# Patient Record
Sex: Female | Born: 1975 | Race: Black or African American | Hispanic: No | Marital: Single | Smoking: Never smoker
Health system: Southern US, Community
[De-identification: ages and names within clinical notes are randomized; demographics above are authoritative.]

## PROBLEM LIST (undated history)

## (undated) DIAGNOSIS — F200 Paranoid schizophrenia: Secondary | ICD-10-CM

## (undated) DIAGNOSIS — R443 Hallucinations, unspecified: Secondary | ICD-10-CM

## (undated) DIAGNOSIS — F32A Depression, unspecified: Secondary | ICD-10-CM

## (undated) DIAGNOSIS — F329 Major depressive disorder, single episode, unspecified: Secondary | ICD-10-CM

## (undated) DIAGNOSIS — F259 Schizoaffective disorder, unspecified: Secondary | ICD-10-CM

## (undated) DIAGNOSIS — M1990 Unspecified osteoarthritis, unspecified site: Secondary | ICD-10-CM

---

## 2016-11-10 DIAGNOSIS — S40011A Contusion of right shoulder, initial encounter: Secondary | ICD-10-CM | POA: Diagnosis not present

## 2016-11-10 DIAGNOSIS — M25551 Pain in right hip: Secondary | ICD-10-CM | POA: Diagnosis not present

## 2016-11-10 DIAGNOSIS — Y92810 Car as the place of occurrence of the external cause: Secondary | ICD-10-CM | POA: Diagnosis not present

## 2016-11-10 DIAGNOSIS — M79601 Pain in right arm: Secondary | ICD-10-CM | POA: Diagnosis not present

## 2016-11-10 DIAGNOSIS — Z743 Need for continuous supervision: Secondary | ICD-10-CM | POA: Diagnosis not present

## 2016-11-10 DIAGNOSIS — E039 Hypothyroidism, unspecified: Secondary | ICD-10-CM | POA: Diagnosis not present

## 2016-11-10 DIAGNOSIS — M25511 Pain in right shoulder: Secondary | ICD-10-CM | POA: Diagnosis not present

## 2016-11-12 DIAGNOSIS — Z743 Need for continuous supervision: Secondary | ICD-10-CM | POA: Diagnosis not present

## 2016-11-12 DIAGNOSIS — F339 Major depressive disorder, recurrent, unspecified: Secondary | ICD-10-CM | POA: Diagnosis not present

## 2016-11-12 DIAGNOSIS — E039 Hypothyroidism, unspecified: Secondary | ICD-10-CM | POA: Diagnosis not present

## 2016-11-12 DIAGNOSIS — F22 Delusional disorders: Secondary | ICD-10-CM | POA: Diagnosis not present

## 2016-11-12 DIAGNOSIS — F341 Dysthymic disorder: Secondary | ICD-10-CM | POA: Diagnosis not present

## 2016-11-12 DIAGNOSIS — R443 Hallucinations, unspecified: Secondary | ICD-10-CM | POA: Diagnosis not present

## 2016-11-12 DIAGNOSIS — R51 Headache: Secondary | ICD-10-CM | POA: Diagnosis not present

## 2016-11-12 DIAGNOSIS — R4182 Altered mental status, unspecified: Secondary | ICD-10-CM | POA: Diagnosis not present

## 2016-11-13 DIAGNOSIS — R440 Auditory hallucinations: Secondary | ICD-10-CM | POA: Diagnosis not present

## 2016-11-13 DIAGNOSIS — F209 Schizophrenia, unspecified: Secondary | ICD-10-CM | POA: Diagnosis not present

## 2016-11-13 DIAGNOSIS — Z743 Need for continuous supervision: Secondary | ICD-10-CM | POA: Diagnosis not present

## 2016-11-13 DIAGNOSIS — F29 Unspecified psychosis not due to a substance or known physiological condition: Secondary | ICD-10-CM | POA: Diagnosis not present

## 2017-03-04 DIAGNOSIS — R42 Dizziness and giddiness: Secondary | ICD-10-CM | POA: Diagnosis not present

## 2017-03-04 DIAGNOSIS — Z7689 Persons encountering health services in other specified circumstances: Secondary | ICD-10-CM | POA: Diagnosis not present

## 2017-03-06 DIAGNOSIS — R45851 Suicidal ideations: Secondary | ICD-10-CM | POA: Diagnosis not present

## 2017-03-06 DIAGNOSIS — F329 Major depressive disorder, single episode, unspecified: Secondary | ICD-10-CM | POA: Diagnosis not present

## 2017-03-06 DIAGNOSIS — R410 Disorientation, unspecified: Secondary | ICD-10-CM | POA: Diagnosis not present

## 2017-03-06 DIAGNOSIS — F209 Schizophrenia, unspecified: Secondary | ICD-10-CM | POA: Diagnosis not present

## 2017-03-06 DIAGNOSIS — M79604 Pain in right leg: Secondary | ICD-10-CM | POA: Diagnosis not present

## 2017-03-06 DIAGNOSIS — D649 Anemia, unspecified: Secondary | ICD-10-CM | POA: Diagnosis not present

## 2017-03-06 DIAGNOSIS — R531 Weakness: Secondary | ICD-10-CM | POA: Diagnosis not present

## 2017-03-10 DIAGNOSIS — M549 Dorsalgia, unspecified: Secondary | ICD-10-CM | POA: Diagnosis not present

## 2017-03-10 DIAGNOSIS — F259 Schizoaffective disorder, unspecified: Secondary | ICD-10-CM | POA: Diagnosis not present

## 2017-03-22 DIAGNOSIS — Z743 Need for continuous supervision: Secondary | ICD-10-CM | POA: Diagnosis not present

## 2017-03-22 DIAGNOSIS — R6889 Other general symptoms and signs: Secondary | ICD-10-CM | POA: Diagnosis not present

## 2017-03-22 DIAGNOSIS — Z590 Homelessness: Secondary | ICD-10-CM | POA: Diagnosis not present

## 2017-03-22 DIAGNOSIS — F29 Unspecified psychosis not due to a substance or known physiological condition: Secondary | ICD-10-CM | POA: Diagnosis not present

## 2017-03-22 DIAGNOSIS — F209 Schizophrenia, unspecified: Secondary | ICD-10-CM | POA: Diagnosis not present

## 2017-03-22 DIAGNOSIS — F250 Schizoaffective disorder, bipolar type: Secondary | ICD-10-CM | POA: Diagnosis not present

## 2017-03-23 DIAGNOSIS — R6889 Other general symptoms and signs: Secondary | ICD-10-CM | POA: Diagnosis not present

## 2017-03-23 DIAGNOSIS — F250 Schizoaffective disorder, bipolar type: Secondary | ICD-10-CM | POA: Diagnosis not present

## 2017-03-24 DIAGNOSIS — D72819 Decreased white blood cell count, unspecified: Secondary | ICD-10-CM | POA: Diagnosis present

## 2017-03-24 DIAGNOSIS — F329 Major depressive disorder, single episode, unspecified: Secondary | ICD-10-CM | POA: Diagnosis not present

## 2017-03-24 DIAGNOSIS — R110 Nausea: Secondary | ICD-10-CM | POA: Diagnosis not present

## 2017-03-24 DIAGNOSIS — Z743 Need for continuous supervision: Secondary | ICD-10-CM | POA: Diagnosis not present

## 2017-03-24 DIAGNOSIS — R112 Nausea with vomiting, unspecified: Secondary | ICD-10-CM | POA: Diagnosis not present

## 2017-03-24 DIAGNOSIS — M79604 Pain in right leg: Secondary | ICD-10-CM | POA: Diagnosis not present

## 2017-03-24 DIAGNOSIS — F209 Schizophrenia, unspecified: Secondary | ICD-10-CM | POA: Diagnosis not present

## 2017-03-24 DIAGNOSIS — Z590 Homelessness: Secondary | ICD-10-CM | POA: Diagnosis not present

## 2017-03-24 DIAGNOSIS — F259 Schizoaffective disorder, unspecified: Secondary | ICD-10-CM | POA: Diagnosis present

## 2017-03-24 DIAGNOSIS — N926 Irregular menstruation, unspecified: Secondary | ICD-10-CM | POA: Diagnosis not present

## 2017-03-24 DIAGNOSIS — F29 Unspecified psychosis not due to a substance or known physiological condition: Secondary | ICD-10-CM | POA: Diagnosis not present

## 2017-03-24 DIAGNOSIS — F600 Paranoid personality disorder: Secondary | ICD-10-CM | POA: Diagnosis not present

## 2017-03-24 DIAGNOSIS — R1110 Vomiting, unspecified: Secondary | ICD-10-CM | POA: Diagnosis not present

## 2017-03-24 DIAGNOSIS — D649 Anemia, unspecified: Secondary | ICD-10-CM | POA: Diagnosis present

## 2017-04-06 DIAGNOSIS — F258 Other schizoaffective disorders: Secondary | ICD-10-CM | POA: Diagnosis not present

## 2017-04-07 DIAGNOSIS — R51 Headache: Secondary | ICD-10-CM | POA: Diagnosis not present

## 2017-04-07 DIAGNOSIS — R112 Nausea with vomiting, unspecified: Secondary | ICD-10-CM | POA: Diagnosis not present

## 2017-04-07 DIAGNOSIS — F2089 Other schizophrenia: Secondary | ICD-10-CM | POA: Diagnosis not present

## 2017-04-07 DIAGNOSIS — Z048 Encounter for examination and observation for other specified reasons: Secondary | ICD-10-CM | POA: Diagnosis not present

## 2017-04-07 DIAGNOSIS — F329 Major depressive disorder, single episode, unspecified: Secondary | ICD-10-CM | POA: Diagnosis not present

## 2017-04-09 DIAGNOSIS — Z590 Homelessness: Secondary | ICD-10-CM | POA: Diagnosis not present

## 2017-04-09 DIAGNOSIS — F99 Mental disorder, not otherwise specified: Secondary | ICD-10-CM | POA: Diagnosis not present

## 2017-04-09 DIAGNOSIS — Z9119 Patient's noncompliance with other medical treatment and regimen: Secondary | ICD-10-CM | POA: Diagnosis not present

## 2017-04-09 DIAGNOSIS — F259 Schizoaffective disorder, unspecified: Secondary | ICD-10-CM | POA: Diagnosis not present

## 2017-04-09 DIAGNOSIS — F22 Delusional disorders: Secondary | ICD-10-CM | POA: Diagnosis not present

## 2017-04-09 DIAGNOSIS — R440 Auditory hallucinations: Secondary | ICD-10-CM | POA: Diagnosis not present

## 2017-04-09 DIAGNOSIS — R442 Other hallucinations: Secondary | ICD-10-CM | POA: Diagnosis not present

## 2017-04-09 DIAGNOSIS — F250 Schizoaffective disorder, bipolar type: Secondary | ICD-10-CM | POA: Diagnosis not present

## 2017-04-09 DIAGNOSIS — Z743 Need for continuous supervision: Secondary | ICD-10-CM | POA: Diagnosis not present

## 2017-04-09 DIAGNOSIS — R443 Hallucinations, unspecified: Secondary | ICD-10-CM | POA: Diagnosis not present

## 2017-04-10 DIAGNOSIS — F250 Schizoaffective disorder, bipolar type: Secondary | ICD-10-CM | POA: Diagnosis not present

## 2017-04-16 DIAGNOSIS — F258 Other schizoaffective disorders: Secondary | ICD-10-CM | POA: Diagnosis not present

## 2017-04-19 DIAGNOSIS — M25551 Pain in right hip: Secondary | ICD-10-CM | POA: Diagnosis not present

## 2017-04-19 DIAGNOSIS — F258 Other schizoaffective disorders: Secondary | ICD-10-CM | POA: Diagnosis not present

## 2017-04-19 DIAGNOSIS — M791 Myalgia: Secondary | ICD-10-CM | POA: Diagnosis not present

## 2017-04-21 DIAGNOSIS — F319 Bipolar disorder, unspecified: Secondary | ICD-10-CM | POA: Diagnosis not present

## 2017-04-21 DIAGNOSIS — F250 Schizoaffective disorder, bipolar type: Secondary | ICD-10-CM | POA: Diagnosis not present

## 2017-04-21 DIAGNOSIS — R5381 Other malaise: Secondary | ICD-10-CM | POA: Diagnosis not present

## 2017-04-23 DIAGNOSIS — F259 Schizoaffective disorder, unspecified: Secondary | ICD-10-CM | POA: Diagnosis not present

## 2017-04-23 DIAGNOSIS — F29 Unspecified psychosis not due to a substance or known physiological condition: Secondary | ICD-10-CM | POA: Diagnosis not present

## 2017-04-24 DIAGNOSIS — F259 Schizoaffective disorder, unspecified: Secondary | ICD-10-CM | POA: Diagnosis not present

## 2017-04-24 DIAGNOSIS — F29 Unspecified psychosis not due to a substance or known physiological condition: Secondary | ICD-10-CM | POA: Diagnosis not present

## 2017-04-25 DIAGNOSIS — F29 Unspecified psychosis not due to a substance or known physiological condition: Secondary | ICD-10-CM | POA: Diagnosis not present

## 2017-04-25 DIAGNOSIS — F259 Schizoaffective disorder, unspecified: Secondary | ICD-10-CM | POA: Diagnosis not present

## 2017-04-26 DIAGNOSIS — R109 Unspecified abdominal pain: Secondary | ICD-10-CM | POA: Diagnosis not present

## 2017-04-26 DIAGNOSIS — D72819 Decreased white blood cell count, unspecified: Secondary | ICD-10-CM | POA: Diagnosis not present

## 2017-04-26 DIAGNOSIS — M25551 Pain in right hip: Secondary | ICD-10-CM | POA: Diagnosis not present

## 2017-04-26 DIAGNOSIS — F209 Schizophrenia, unspecified: Secondary | ICD-10-CM | POA: Diagnosis present

## 2017-04-26 DIAGNOSIS — F259 Schizoaffective disorder, unspecified: Secondary | ICD-10-CM | POA: Diagnosis not present

## 2017-04-26 DIAGNOSIS — F250 Schizoaffective disorder, bipolar type: Secondary | ICD-10-CM | POA: Diagnosis not present

## 2017-04-26 DIAGNOSIS — I499 Cardiac arrhythmia, unspecified: Secondary | ICD-10-CM | POA: Diagnosis not present

## 2017-04-26 DIAGNOSIS — R1013 Epigastric pain: Secondary | ICD-10-CM | POA: Diagnosis not present

## 2017-04-26 DIAGNOSIS — M79671 Pain in right foot: Secondary | ICD-10-CM | POA: Diagnosis not present

## 2017-04-26 DIAGNOSIS — R1110 Vomiting, unspecified: Secondary | ICD-10-CM | POA: Diagnosis not present

## 2017-04-26 DIAGNOSIS — Z781 Physical restraint status: Secondary | ICD-10-CM | POA: Diagnosis not present

## 2017-04-26 DIAGNOSIS — M79672 Pain in left foot: Secondary | ICD-10-CM | POA: Diagnosis not present

## 2017-04-26 DIAGNOSIS — K30 Functional dyspepsia: Secondary | ICD-10-CM | POA: Diagnosis not present

## 2017-04-26 DIAGNOSIS — S79911A Unspecified injury of right hip, initial encounter: Secondary | ICD-10-CM | POA: Diagnosis not present

## 2017-05-08 DIAGNOSIS — Z743 Need for continuous supervision: Secondary | ICD-10-CM | POA: Diagnosis not present

## 2017-05-08 DIAGNOSIS — F259 Schizoaffective disorder, unspecified: Secondary | ICD-10-CM | POA: Diagnosis not present

## 2017-05-08 DIAGNOSIS — F209 Schizophrenia, unspecified: Secondary | ICD-10-CM | POA: Diagnosis not present

## 2017-05-08 DIAGNOSIS — F250 Schizoaffective disorder, bipolar type: Secondary | ICD-10-CM | POA: Diagnosis present

## 2017-05-08 DIAGNOSIS — F251 Schizoaffective disorder, depressive type: Secondary | ICD-10-CM | POA: Diagnosis not present

## 2017-05-10 DIAGNOSIS — F251 Schizoaffective disorder, depressive type: Secondary | ICD-10-CM | POA: Diagnosis not present

## 2017-05-12 DIAGNOSIS — F251 Schizoaffective disorder, depressive type: Secondary | ICD-10-CM | POA: Diagnosis not present

## 2017-05-13 DIAGNOSIS — F251 Schizoaffective disorder, depressive type: Secondary | ICD-10-CM | POA: Diagnosis not present

## 2017-05-14 DIAGNOSIS — F251 Schizoaffective disorder, depressive type: Secondary | ICD-10-CM | POA: Diagnosis not present

## 2017-05-15 DIAGNOSIS — F251 Schizoaffective disorder, depressive type: Secondary | ICD-10-CM | POA: Diagnosis not present

## 2017-05-20 DIAGNOSIS — F251 Schizoaffective disorder, depressive type: Secondary | ICD-10-CM | POA: Diagnosis not present

## 2017-05-21 DIAGNOSIS — F251 Schizoaffective disorder, depressive type: Secondary | ICD-10-CM | POA: Diagnosis not present

## 2017-05-28 DIAGNOSIS — F251 Schizoaffective disorder, depressive type: Secondary | ICD-10-CM | POA: Diagnosis not present

## 2017-05-29 DIAGNOSIS — F251 Schizoaffective disorder, depressive type: Secondary | ICD-10-CM | POA: Diagnosis not present

## 2017-06-03 DIAGNOSIS — F251 Schizoaffective disorder, depressive type: Secondary | ICD-10-CM | POA: Diagnosis not present

## 2017-06-06 DIAGNOSIS — F251 Schizoaffective disorder, depressive type: Secondary | ICD-10-CM | POA: Diagnosis not present

## 2017-06-10 DIAGNOSIS — F251 Schizoaffective disorder, depressive type: Secondary | ICD-10-CM | POA: Diagnosis not present

## 2017-06-14 DIAGNOSIS — F251 Schizoaffective disorder, depressive type: Secondary | ICD-10-CM | POA: Diagnosis not present

## 2017-06-18 DIAGNOSIS — F251 Schizoaffective disorder, depressive type: Secondary | ICD-10-CM | POA: Diagnosis not present

## 2017-06-28 DIAGNOSIS — F251 Schizoaffective disorder, depressive type: Secondary | ICD-10-CM | POA: Diagnosis not present

## 2017-07-02 DIAGNOSIS — F251 Schizoaffective disorder, depressive type: Secondary | ICD-10-CM | POA: Diagnosis not present

## 2017-07-07 DIAGNOSIS — F251 Schizoaffective disorder, depressive type: Secondary | ICD-10-CM | POA: Diagnosis not present

## 2017-07-17 DIAGNOSIS — F251 Schizoaffective disorder, depressive type: Secondary | ICD-10-CM | POA: Diagnosis not present

## 2017-07-30 DIAGNOSIS — F251 Schizoaffective disorder, depressive type: Secondary | ICD-10-CM | POA: Diagnosis not present

## 2017-08-31 DIAGNOSIS — F449 Dissociative and conversion disorder, unspecified: Secondary | ICD-10-CM | POA: Diagnosis not present

## 2017-08-31 DIAGNOSIS — F251 Schizoaffective disorder, depressive type: Secondary | ICD-10-CM | POA: Diagnosis not present

## 2017-09-03 DIAGNOSIS — H4311 Vitreous hemorrhage, right eye: Secondary | ICD-10-CM | POA: Diagnosis not present

## 2017-09-03 DIAGNOSIS — H11133 Conjunctival pigmentations, bilateral: Secondary | ICD-10-CM | POA: Diagnosis not present

## 2017-09-05 DIAGNOSIS — F251 Schizoaffective disorder, depressive type: Secondary | ICD-10-CM | POA: Diagnosis not present

## 2017-09-24 DIAGNOSIS — N946 Dysmenorrhea, unspecified: Secondary | ICD-10-CM | POA: Diagnosis not present

## 2017-09-24 DIAGNOSIS — F251 Schizoaffective disorder, depressive type: Secondary | ICD-10-CM | POA: Diagnosis not present

## 2017-10-02 DIAGNOSIS — N946 Dysmenorrhea, unspecified: Secondary | ICD-10-CM | POA: Diagnosis not present

## 2017-10-02 DIAGNOSIS — F251 Schizoaffective disorder, depressive type: Secondary | ICD-10-CM | POA: Diagnosis not present

## 2017-10-17 DIAGNOSIS — F251 Schizoaffective disorder, depressive type: Secondary | ICD-10-CM | POA: Diagnosis not present

## 2017-10-17 DIAGNOSIS — N946 Dysmenorrhea, unspecified: Secondary | ICD-10-CM | POA: Diagnosis not present

## 2017-11-05 DIAGNOSIS — N946 Dysmenorrhea, unspecified: Secondary | ICD-10-CM | POA: Diagnosis not present

## 2017-11-05 DIAGNOSIS — F251 Schizoaffective disorder, depressive type: Secondary | ICD-10-CM | POA: Diagnosis not present

## 2017-11-06 DIAGNOSIS — F251 Schizoaffective disorder, depressive type: Secondary | ICD-10-CM | POA: Diagnosis not present

## 2017-11-13 DIAGNOSIS — F251 Schizoaffective disorder, depressive type: Secondary | ICD-10-CM | POA: Diagnosis not present

## 2017-11-13 DIAGNOSIS — N946 Dysmenorrhea, unspecified: Secondary | ICD-10-CM | POA: Diagnosis not present

## 2017-11-24 DIAGNOSIS — F449 Dissociative and conversion disorder, unspecified: Secondary | ICD-10-CM | POA: Diagnosis not present

## 2017-11-24 DIAGNOSIS — F251 Schizoaffective disorder, depressive type: Secondary | ICD-10-CM | POA: Diagnosis not present

## 2017-12-07 DIAGNOSIS — F251 Schizoaffective disorder, depressive type: Secondary | ICD-10-CM | POA: Diagnosis not present

## 2017-12-18 DIAGNOSIS — F251 Schizoaffective disorder, depressive type: Secondary | ICD-10-CM | POA: Diagnosis not present

## 2017-12-18 DIAGNOSIS — F449 Dissociative and conversion disorder, unspecified: Secondary | ICD-10-CM | POA: Diagnosis not present

## 2017-12-31 DIAGNOSIS — F251 Schizoaffective disorder, depressive type: Secondary | ICD-10-CM | POA: Diagnosis not present

## 2017-12-31 DIAGNOSIS — F449 Dissociative and conversion disorder, unspecified: Secondary | ICD-10-CM | POA: Diagnosis not present

## 2018-01-03 DIAGNOSIS — F449 Dissociative and conversion disorder, unspecified: Secondary | ICD-10-CM | POA: Diagnosis not present

## 2018-01-03 DIAGNOSIS — F251 Schizoaffective disorder, depressive type: Secondary | ICD-10-CM | POA: Diagnosis not present

## 2018-02-11 DIAGNOSIS — Z743 Need for continuous supervision: Secondary | ICD-10-CM | POA: Diagnosis not present

## 2018-02-11 DIAGNOSIS — R109 Unspecified abdominal pain: Secondary | ICD-10-CM | POA: Diagnosis not present

## 2018-02-11 DIAGNOSIS — R1084 Generalized abdominal pain: Secondary | ICD-10-CM | POA: Diagnosis not present

## 2018-02-12 DIAGNOSIS — Z008 Encounter for other general examination: Secondary | ICD-10-CM | POA: Diagnosis not present

## 2018-02-12 DIAGNOSIS — F209 Schizophrenia, unspecified: Secondary | ICD-10-CM | POA: Diagnosis not present

## 2018-02-13 DIAGNOSIS — Z008 Encounter for other general examination: Secondary | ICD-10-CM | POA: Diagnosis not present

## 2018-02-13 DIAGNOSIS — R443 Hallucinations, unspecified: Secondary | ICD-10-CM | POA: Diagnosis not present

## 2018-02-13 DIAGNOSIS — Z781 Physical restraint status: Secondary | ICD-10-CM | POA: Diagnosis not present

## 2018-02-13 DIAGNOSIS — F250 Schizoaffective disorder, bipolar type: Secondary | ICD-10-CM | POA: Diagnosis present

## 2018-02-13 DIAGNOSIS — F209 Schizophrenia, unspecified: Secondary | ICD-10-CM | POA: Diagnosis not present

## 2018-02-13 DIAGNOSIS — Z743 Need for continuous supervision: Secondary | ICD-10-CM | POA: Diagnosis not present

## 2018-02-13 DIAGNOSIS — Z9114 Patient's other noncompliance with medication regimen: Secondary | ICD-10-CM | POA: Diagnosis not present

## 2018-03-13 DIAGNOSIS — Z0489 Encounter for examination and observation for other specified reasons: Secondary | ICD-10-CM | POA: Diagnosis not present

## 2018-03-13 DIAGNOSIS — R4182 Altered mental status, unspecified: Secondary | ICD-10-CM | POA: Diagnosis not present

## 2018-03-13 DIAGNOSIS — F258 Other schizoaffective disorders: Secondary | ICD-10-CM | POA: Diagnosis not present

## 2018-03-13 DIAGNOSIS — F200 Paranoid schizophrenia: Secondary | ICD-10-CM | POA: Diagnosis not present

## 2018-03-13 DIAGNOSIS — F259 Schizoaffective disorder, unspecified: Secondary | ICD-10-CM | POA: Diagnosis not present

## 2018-03-13 DIAGNOSIS — Z743 Need for continuous supervision: Secondary | ICD-10-CM | POA: Diagnosis not present

## 2018-03-13 DIAGNOSIS — F3189 Other bipolar disorder: Secondary | ICD-10-CM | POA: Diagnosis not present

## 2018-05-16 DIAGNOSIS — Z4800 Encounter for change or removal of nonsurgical wound dressing: Secondary | ICD-10-CM | POA: Diagnosis not present

## 2018-05-16 DIAGNOSIS — R10814 Left lower quadrant abdominal tenderness: Secondary | ICD-10-CM | POA: Diagnosis not present

## 2018-05-16 DIAGNOSIS — Z7401 Bed confinement status: Secondary | ICD-10-CM | POA: Diagnosis not present

## 2018-05-16 DIAGNOSIS — Z008 Encounter for other general examination: Secondary | ICD-10-CM | POA: Diagnosis not present

## 2018-05-16 DIAGNOSIS — R404 Transient alteration of awareness: Secondary | ICD-10-CM | POA: Diagnosis not present

## 2018-05-16 DIAGNOSIS — F3189 Other bipolar disorder: Secondary | ICD-10-CM | POA: Diagnosis not present

## 2018-05-16 DIAGNOSIS — Z743 Need for continuous supervision: Secondary | ICD-10-CM | POA: Diagnosis not present

## 2018-05-16 DIAGNOSIS — E871 Hypo-osmolality and hyponatremia: Secondary | ICD-10-CM | POA: Diagnosis not present

## 2018-05-16 DIAGNOSIS — F251 Schizoaffective disorder, depressive type: Secondary | ICD-10-CM | POA: Diagnosis not present

## 2018-05-16 DIAGNOSIS — K5900 Constipation, unspecified: Secondary | ICD-10-CM | POA: Diagnosis not present

## 2018-05-16 DIAGNOSIS — F6389 Other impulse disorders: Secondary | ICD-10-CM | POA: Diagnosis not present

## 2018-05-16 DIAGNOSIS — F258 Other schizoaffective disorders: Secondary | ICD-10-CM | POA: Diagnosis not present

## 2018-05-28 DIAGNOSIS — K5900 Constipation, unspecified: Secondary | ICD-10-CM | POA: Diagnosis not present

## 2018-05-30 DIAGNOSIS — R10814 Left lower quadrant abdominal tenderness: Secondary | ICD-10-CM | POA: Diagnosis not present

## 2018-05-31 DIAGNOSIS — Z008 Encounter for other general examination: Secondary | ICD-10-CM | POA: Diagnosis not present

## 2018-05-31 DIAGNOSIS — E871 Hypo-osmolality and hyponatremia: Secondary | ICD-10-CM | POA: Diagnosis not present

## 2018-05-31 DIAGNOSIS — Z4800 Encounter for change or removal of nonsurgical wound dressing: Secondary | ICD-10-CM | POA: Diagnosis not present

## 2018-06-23 ENCOUNTER — Other Ambulatory Visit: Payer: Self-pay

## 2018-06-23 ENCOUNTER — Emergency Department (HOSPITAL_COMMUNITY)
Admission: EM | Admit: 2018-06-23 | Discharge: 2018-06-24 | Disposition: A | Discharge status: Other Acute Inpatient Hospital | Payer: Medicare Other | Attending: Emergency Medicine | Admitting: Emergency Medicine

## 2018-06-23 DIAGNOSIS — F29 Unspecified psychosis not due to a substance or known physiological condition: Secondary | ICD-10-CM | POA: Insufficient documentation

## 2018-06-23 DIAGNOSIS — F312 Bipolar disorder, current episode manic severe with psychotic features: Secondary | ICD-10-CM | POA: Diagnosis present

## 2018-06-23 DIAGNOSIS — F319 Bipolar disorder, unspecified: Secondary | ICD-10-CM | POA: Insufficient documentation

## 2018-06-23 LAB — COMPREHENSIVE METABOLIC PANEL
ALK PHOS: 50 U/L (ref 38–126)
ALT: 18 U/L (ref 0–44)
AST: 28 U/L (ref 15–41)
Albumin: 4.2 g/dL (ref 3.5–5.0)
Anion gap: 9 (ref 5–15)
BUN: 7 mg/dL (ref 6–20)
CALCIUM: 9.1 mg/dL (ref 8.9–10.3)
CO2: 25 mmol/L (ref 22–32)
CREATININE: 0.63 mg/dL (ref 0.44–1.00)
Chloride: 104 mmol/L (ref 98–111)
Glucose, Bld: 84 mg/dL (ref 70–99)
Potassium: 3.6 mmol/L (ref 3.5–5.1)
Sodium: 138 mmol/L (ref 135–145)
TOTAL PROTEIN: 7 g/dL (ref 6.5–8.1)
Total Bilirubin: 0.8 mg/dL (ref 0.3–1.2)

## 2018-06-23 LAB — CBC WITH DIFFERENTIAL/PLATELET
Basophils Absolute: 0 K/uL (ref 0.0–0.1)
Basophils Relative: 0 %
Eosinophils Absolute: 0.1 K/uL (ref 0.0–0.7)
Eosinophils Relative: 2 %
HCT: 35.3 % — ABNORMAL LOW (ref 36.0–46.0)
Hemoglobin: 11.7 g/dL — ABNORMAL LOW (ref 12.0–15.0)
Lymphocytes Relative: 36 %
Lymphs Abs: 1.9 K/uL (ref 0.7–4.0)
MCH: 28 pg (ref 26.0–34.0)
MCHC: 33.1 g/dL (ref 30.0–36.0)
MCV: 84.4 fL (ref 78.0–100.0)
Monocytes Absolute: 0.6 K/uL (ref 0.1–1.0)
Monocytes Relative: 11 %
Neutro Abs: 2.7 K/uL (ref 1.7–7.7)
Neutrophils Relative %: 51 %
Platelets: 415 K/uL — ABNORMAL HIGH (ref 150–400)
RBC: 4.18 MIL/uL (ref 3.87–5.11)
RDW: 14.4 % (ref 11.5–15.5)
WBC: 5.3 K/uL (ref 4.0–10.5)

## 2018-06-23 LAB — ETHANOL: Alcohol, Ethyl (B): <10 mg/dL

## 2018-06-23 LAB — I-STAT BETA HCG BLOOD, ED (MC, WL, AP ONLY): I-stat hCG, quantitative: <5 m[IU]/mL

## 2018-06-23 LAB — CBG MONITORING, ED: Glucose-Capillary: 77 mg/dL (ref 70–99)

## 2018-06-23 MED ORDER — ZIPRASIDONE MESYLATE 20 MG IM SOLR: 20.0000 mg | INTRAMUSCULAR | Status: DC | PRN

## 2018-06-23 MED ORDER — ALUM & MAG HYDROXIDE-SIMETH 200-200-20 MG/5ML PO SUSP: 30.0000 mL | Freq: Four times a day (QID) | ORAL | Status: DC | PRN

## 2018-06-23 MED ORDER — ACETAMINOPHEN 325 MG PO TABS
650.0000 mg | ORAL_TABLET | ORAL | Status: DC | PRN
  Administered 2018-06-24 (×2): 650 mg via ORAL
  Filled 2018-06-23 (×2): qty 2

## 2018-06-23 MED ORDER — RISPERIDONE 1 MG PO TBDP
2.0000 mg | ORAL_TABLET | Freq: Three times a day (TID) | ORAL | Status: DC | PRN
  Filled 2018-06-23: qty 2

## 2018-06-23 MED ORDER — ZOLPIDEM TARTRATE 5 MG PO TABS: 5.0000 mg | ORAL_TABLET | Freq: Every evening | ORAL | Status: DC | PRN

## 2018-06-23 MED ORDER — LORAZEPAM 1 MG PO TABS
1.0000 mg | ORAL_TABLET | ORAL | Status: AC | PRN
  Administered 2018-06-24: 1 mg via ORAL
  Filled 2018-06-23: qty 1

## 2018-06-23 MED ORDER — ONDANSETRON HCL 4 MG PO TABS: 4.0000 mg | ORAL_TABLET | Freq: Three times a day (TID) | ORAL | Status: DC | PRN
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
## 2018-06-23 NOTE — ED Triage Notes (Addendum)
Pt to ED via [facility] with [facility] with complaints of manic like behavior. Pt was found wondering in parking lot. Pt was stating "Jesus Lorie ApleyChrist is out [place]" talking very fast to [facility] and then got combative with herself. Pt was also stating she "was on mission to kill [employer] CompanyJesus Christ" [facility] and did state to [facility] "do not take me to that hospital" while pointing towards [facility]. Pt was aggressive to [facility] when asking what medications she takes, [facility] states she got very defensive and angry did a complete flip. Pt was given 5 mg of Versed and 5 mg of Haldol. Pt states she is here from [place]

## 2018-06-23 NOTE — ED Notes (Signed)
Two personal bags in locker 28

## 2018-06-23 NOTE — ED Provider Notes (Addendum)
[facility]-EMERGENCY DEPT Provider Note   CSN: [id number] Arrival date & time: 06/23/18  1931     History   Chief Complaint Chief Complaint  Patient presents with  . Manic Behavior    HPI [name] [name] is a 42 y.o. female.  HPI Level 125 caveat 42 year old female brought in via EMS with reports that she was agitated and "speaking and voices".  They state that she call them.  She was found outside.  She answered some questions and became very agitated yelling that Jesus was trying to kill her and striking herself.  She received Versed and Haldol IV prior to arrival to hospital. No past medical history on file.  There are no active problems to display for this patient.   PMH- not known   OB History   None      Home Medications    Prior to Admission medications   Not on File    Family History No family history on file.  Social History Social History   Tobacco Use  . Smoking status: Not on file  Substance Use Topics  . Alcohol use: Not on file  . Drug use: Not on file     Allergies   Patient has no allergy information on record.   Review of Systems Review of Systems  All other systems reviewed and are negative.    Physical Exam Updated Vital Signs Ht 1.473 m (4\' 10" )   Wt 53.1 kg   SpO2 98%   BMI 24.45 kg/m   Physical Exam  Constitutional: She is oriented to person, place, and time. She appears well-developed and well-nourished.  HENT:  Head: Normocephalic and atraumatic.  Right Ear: External ear normal.  Left Ear: External ear normal.  Nose: Nose normal.  Mouth/Throat: Oropharynx is clear and moist.  Eyes: Pupils are equal, round, and reactive to light. EOM are normal.  Neck: Neck supple.  Cardiovascular: Normal rate, regular rhythm, normal heart sounds and intact distal pulses.  Pulmonary/Chest: Effort normal and breath sounds normal.  Abdominal: Soft.  Musculoskeletal: Normal range of motion.  Neurological: She is  alert and oriented to person, place, and time. She displays normal reflexes. No cranial nerve deficit or sensory deficit. She exhibits normal muscle tone. Coordination normal.  Skin: Skin is warm and dry. Capillary refill takes less than 2 seconds.  Psychiatric: She has a normal mood and affect.  Nursing note and vitals reviewed.    ED Treatments / Results  Labs (all labs ordered are listed, but only abnormal results are displayed) Labs Reviewed  COMPREHENSIVE METABOLIC PANEL  ETHANOL  RAPID URINE DRUG SCREEN, HOSP PERFORMED  CBC WITH DIFFERENTIAL/PLATELET  I-STAT BETA HCG BLOOD, ED ([facility], [facility], [facility] ONLY)  CBG MONITORING, ED    EKG EKG Interpretation  Date/Time:  Sunday June 23 2018 20:13:08 EDT Ventricular Rate:  77 PR Interval:    QRS Duration: 92 QT Interval:  380 QTC Calculation: 430 R Axis:   85 Text Interpretation:  Sinus rhythm diffuse nsst Confirmed by Margarita Grizzleay, Lawanna Cecere [phone]([id number]) on 06/23/2018 8:21:27 PM   Radiology No results found.  Procedures Procedures (including critical care time)  Medications Ordered in ED Medications - No data to display   Initial Impression / Assessment and Plan / ED Course  I have reviewed the triage vital signs and the nursing notes.  Pertinent labs & imaging results that were available during my care of the patient were reviewed by me and considered in my medical decision making (see  chart for details).    42 year old female who presented today with psychosis.  She was treated prehospital with Versed and Haldol.  Here she is calm and cooperative.  Medical evaluation shows no evidence of acute metabolic derangement.  She will be placed in psych hold pending TTS evaluation Patient seen by tts and they are seeking inpatient placement Final Clinical Impressions(s) / ED Diagnoses   Final diagnoses:  Psychosis, unspecified psychosis type [facility](HCC)    ED Discharge Orders    None       Margarita Grizzleay, Aliene Tamura, MD 06/23/18 2203    Margarita Grizzleay, Taylia Berber,  MD 06/23/18 2242

## 2018-06-23 NOTE — ED Notes (Signed)
Extra blanket given to pt. Pt unable to urinate at this time.

## 2018-06-23 NOTE — Progress Notes (Signed)
TTS consulted with [name] SievertSpencer Simon, PA who recommends inpt treatment. EDP [name] [name], Danielle, MD and pt's nurse have been advised of the disposition. TTS to seek placement.   [name] [name], MSW, LCSW Therapeutic Triage Specialist  [phone]

## 2018-06-23 NOTE — ED Notes (Signed)
Bed: [id number] Expected date:  Expected time:  Means of arrival:  Comments: EMS 42 yo female wandering in parking lot-combative 110/60-Haldol and Versed 5 mg IV

## 2018-06-24 ENCOUNTER — Inpatient Hospital Stay (HOSPITAL_COMMUNITY)
Admission: AD | Admit: 2018-06-24 | Discharge: 2018-06-28 | DRG: 885 | Disposition: A | Discharge status: Home / Self Care | Payer: Medicare Other | Attending: Psychiatry | Admitting: Psychiatry

## 2018-06-24 ENCOUNTER — Encounter (HOSPITAL_COMMUNITY): Payer: Self-pay | Admitting: *Deleted

## 2018-06-24 ENCOUNTER — Other Ambulatory Visit: Payer: Self-pay

## 2018-06-24 DIAGNOSIS — F419 Anxiety disorder, unspecified: Secondary | ICD-10-CM | POA: Diagnosis present

## 2018-06-24 DIAGNOSIS — R451 Restlessness and agitation: Secondary | ICD-10-CM | POA: Diagnosis present

## 2018-06-24 DIAGNOSIS — G4700 Insomnia, unspecified: Secondary | ICD-10-CM | POA: Diagnosis present

## 2018-06-24 DIAGNOSIS — Z79899 Other long term (current) drug therapy: Secondary | ICD-10-CM | POA: Diagnosis not present

## 2018-06-24 DIAGNOSIS — K219 Gastro-esophageal reflux disease without esophagitis: Secondary | ICD-10-CM | POA: Diagnosis present

## 2018-06-24 DIAGNOSIS — F312 Bipolar disorder, current episode manic severe with psychotic features: Secondary | ICD-10-CM | POA: Diagnosis present

## 2018-06-24 DIAGNOSIS — Z915 Personal history of self-harm: Secondary | ICD-10-CM

## 2018-06-24 LAB — RAPID URINE DRUG SCREEN, HOSP PERFORMED
AMPHETAMINES: NOT DETECTED
BARBITURATES: NOT DETECTED
BENZODIAZEPINES: POSITIVE — AB
COCAINE: NOT DETECTED
TETRAHYDROCANNABINOL: NOT DETECTED

## 2018-06-24 MED ORDER — OLANZAPINE 10 MG PO TABS: 10.0000 mg | ORAL_TABLET | Freq: Two times a day (BID) | ORAL | Status: DC

## 2018-06-24 MED ORDER — ALUM & MAG HYDROXIDE-SIMETH 200-200-20 MG/5ML PO SUSP: 30.0000 mL | Freq: Four times a day (QID) | ORAL | Status: DC | PRN

## 2018-06-24 MED ORDER — ZIPRASIDONE MESYLATE 20 MG IM SOLR: 20.0000 mg | INTRAMUSCULAR | Status: DC | PRN

## 2018-06-24 MED ORDER — ZOLPIDEM TARTRATE 5 MG PO TABS
5.0000 mg | ORAL_TABLET | Freq: Every evening | ORAL | Status: DC | PRN
  Administered 2018-06-24: 5 mg via ORAL
  Filled 2018-06-24: qty 1

## 2018-06-24 MED ORDER — ACETAMINOPHEN 325 MG PO TABS: 650.0000 mg | ORAL_TABLET | ORAL | Status: DC | PRN

## 2018-06-24 MED ORDER — RISPERIDONE 2 MG PO TBDP: 2.0000 mg | ORAL_TABLET | Freq: Three times a day (TID) | ORAL | Status: DC | PRN

## 2018-06-24 MED ORDER — ONDANSETRON HCL 4 MG PO TABS: 4.0000 mg | ORAL_TABLET | Freq: Three times a day (TID) | ORAL | Status: DC | PRN

## 2018-06-24 MED ORDER — MAGNESIUM HYDROXIDE 400 MG/5ML PO SUSP: 30.0000 mL | Freq: Every day | ORAL | Status: DC | PRN

## 2018-06-24 MED ORDER — ALUM & MAG HYDROXIDE-SIMETH 200-200-20 MG/5ML PO SUSP
30.0000 mL | ORAL | Status: DC | PRN
  Administered 2018-06-27: 30 mL via ORAL
  Filled 2018-06-24: qty 30

## 2018-06-24 MED ORDER — ACETAMINOPHEN 325 MG PO TABS
650.0000 mg | ORAL_TABLET | Freq: Four times a day (QID) | ORAL | Status: DC | PRN
  Administered 2018-06-24 – 2018-06-27 (×5): 650 mg via ORAL
  Filled 2018-06-24 (×5): qty 2

## 2018-06-24 MED ORDER — OLANZAPINE 10 MG PO TABS
10.0000 mg | ORAL_TABLET | Freq: Two times a day (BID) | ORAL | Status: DC
Start: 2018-06-24 — End: 2018-06-27
  Administered 2018-06-25 – 2018-06-27 (×5): 10 mg via ORAL
  Filled 2018-06-24 (×9): qty 1

## 2018-06-24 NOTE — BH Assessment (Signed)
[facility]  Per Juanetta BeetsJacqueline Norman, DO, this pt requires psychiatric hospitalization at this time.  Malva LimesLinsey Strader, RN, [id number]; [facility] will be ready to receive pt at 17:00.  Pt has signed Voluntary Admission and Consent for Treatment, as well as Consent to Release Information to no one, and signed forms [facility].  Pt's nurse, Adela LankJacqueline, has been notified, and agrees to send original paperwork along with pt via [facility] [place], and to call report to [phone].  Doylene Canninghomas Melvia Matousek, [place] Behavioral Health Coordinator [phone]

## 2018-06-24 NOTE — Tx Team (Signed)
Initial Treatment Plan 06/24/2018 8:00 PM [name] DittyBillie Dillard [id number]:[id number]    PATIENT STRESSORS: Medication change or noncompliance Other: housing issues   PATIENT STRENGTHS: Ability for insight Average or above average intelligence Capable of independent living   PATIENT IDENTIFIED PROBLEMS: Paranoia "Kind of stressed out, I didn't really want to come here" "I don't want to take any medications"                     DISCHARGE CRITERIA:  Ability to meet basic life and health needs Improved stabilization in mood, thinking, and/or behavior Verbal commitment to aftercare and medication compliance  PRELIMINARY DISCHARGE PLAN: Attend aftercare/continuing care group  PATIENT/FAMILY INVOLVEMENT: This treatment plan has been presented to and reviewed with the patient, [name] [name], and/or family member, .  The patient and family have been given the opportunity to ask questions and make suggestions.  Chadley Dziedzic, [place], [place]RN 06/24/2018, 8:00 PM
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
## 2018-06-24 NOTE — Progress Notes (Signed)
Did not attend group 

## 2018-06-24 NOTE — Consult Note (Addendum)
[place] Psychiatry Consult   Reason for Consult:  Psychosis  Referring Physician:  EDP Patient Identification: [name] [name] MRN:  [id number] Principal Diagnosis: Bipolar affective disorder, manic, severe, with psychotic behavior ([place]) Diagnosis:   Patient Active Problem List   Diagnosis Date Noted  . Bipolar affective disorder, manic, severe, with psychotic behavior ([place]) [F31.2] 06/24/2018    Priority: High    Total Time spent with patient: 45 minutes  Subjective:   [name] [name] is a 42 y.o. female patient admitted with psychosis.  HPI:  42 yo female who presented to the ED with delusions and agitation.  Agitation medications provided and now calm but provides little information.  Responding to internal stimuli with the sheet over her mouth.  Very little history except she recently moved to the area and has not been on her medications, which increases her psychosis.  Medications decrease her psychosis.  Evidently the psychosis started a few days ago and increased as she was off of her medications.  One admission to the ED at [facility] in 2018 for similar presentation with a history of bipolar disorder.  Inpatient hospitalization needed.  Past Psychiatric History: bipolar disorder  Risk to Self: Suicidal Ideation: No Suicidal Intent: No Is patient at risk for suicide?: No Suicidal Plan?: No Access to Means: No What has been your use of drugs/alcohol within the last 12 months?: denies  Triggers for Past Attempts: None known Intentional Self Injurious Behavior: None Risk to Others: Homicidal Ideation: No Thoughts of Harm to Others: No Current Homicidal Intent: No Current Homicidal Plan: No Access to Homicidal Means: No History of harm to others?: No Assessment of Violence: None Noted Does patient have access to weapons?: No Criminal Charges Pending?: No Does patient have a court date: No Prior Inpatient Therapy: Prior Inpatient Therapy: Yes Prior Therapy Dates: 2018 Prior  Therapy Facilty/Provider(s): facility in [place] Reason for Treatment: Bipolar Prior Outpatient Therapy: Prior Outpatient Therapy: No Does patient have an ACCT team?: No Does patient have Intensive In-House Services?  : No Does patient have [facility] services? : No Does patient have P4CC services?: No  Past Medical History: None available Family History: No family history on file. Family Psychiatric  History: none Social History:  Social History   Substance and Sexual Activity  Alcohol Use Not on file     Social History   Substance and Sexual Activity  Drug Use Not on file    Social History   Socioeconomic History  . Marital status: Unknown    Spouse name: Not on file  . Number of children: Not on file  . Years of education: Not on file  . Highest education level: Not on file  Occupational History  . Not on file  Social Needs  . Financial resource strain: Not on file  . Food insecurity:    Worry: Not on file    Inability: Not on file  . Transportation needs:    Medical: Not on file    Non-medical: Not on file  Tobacco Use  . Smoking status: Not on file  Substance and Sexual Activity  . Alcohol use: Not on file  . Drug use: Not on file  . Sexual activity: Not on file  Lifestyle  . Physical activity:    Days per week: Not on file    Minutes per session: Not on file  . Stress: Not on file  Relationships  . Social connections:    Talks on phone: Not on file    Gets  together: Not on file    Attends religious service: Not on file    Active member of club or organization: Not on file    Attends meetings of clubs or organizations: Not on file    Relationship status: Not on file  Other Topics Concern  . Not on file  Social History Narrative  . Not on file   Additional Social History: N/A    Allergies:  Not on File  Labs:  Results for orders placed or performed during the hospital encounter of 06/23/18 (from the past 48 hour(s))  Comprehensive metabolic  panel     Status: None   Collection Time: 06/23/18  8:10 PM  Result Value Ref Range   Sodium 138 135 - 145 mmol/L   Potassium 3.6 3.5 - 5.1 mmol/L   Chloride 104 98 - 111 mmol/L   CO2 25 22 - 32 mmol/L   Glucose, Bld 84 70 - 99 mg/dL   BUN 7 6 - 20 mg/dL   Creatinine, Ser 0.63 0.44 - 1.00 mg/dL   Calcium 9.1 8.9 - 10.3 mg/dL   Total Protein 7.0 6.5 - 8.1 g/dL   Albumin 4.2 3.5 - 5.0 g/dL   AST 28 15 - 41 U/L   ALT 18 0 - 44 U/L   Alkaline Phosphatase 50 38 - 126 U/L   Total Bilirubin 0.8 0.3 - 1.2 mg/dL   GFR calc non Af Amer >60 >60 mL/min   GFR calc Af Amer >60 >60 mL/min    Comment: (NOTE) The eGFR has been calculated using the CKD EPI equation. This calculation has not been validated in all clinical situations. eGFR's persistently <60 mL/min signify possible Chronic Kidney Disease.    Anion gap 9 5 - 15    Comment: Performed at [facility], [place] [address], [place], [place] [address]  CBC with Diff     Status: Abnormal   Collection Time: 06/23/18  8:10 PM  Result Value Ref Range   WBC 5.3 4.0 - 10.5 K/uL   RBC 4.18 3.87 - 5.11 MIL/uL   Hemoglobin 11.7 (L) 12.0 - 15.0 g/dL   HCT 35.3 (L) 36.0 - 46.0 %   MCV 84.4 78.0 - 100.0 fL   MCH 28.0 26.0 - 34.0 pg   MCHC 33.1 30.0 - 36.0 g/dL   RDW 14.4 11.5 - 15.5 %   Platelets 415 (H) 150 - 400 K/uL   Neutrophils Relative % 51 %   Neutro Abs 2.7 1.7 - 7.7 K/uL   Lymphocytes Relative 36 %   Lymphs Abs 1.9 0.7 - 4.0 K/uL   Monocytes Relative 11 %   Monocytes Absolute 0.6 0.1 - 1.0 K/uL   Eosinophils Relative 2 %   Eosinophils Absolute 0.1 0.0 - 0.7 K/uL   Basophils Relative 0 %   Basophils Absolute 0.0 0.0 - 0.1 K/uL    Comment: Performed at [facility], [place] [address] [place], [place] [address]  Ethanol     Status: None   Collection Time: 06/23/18  8:11 PM  Result Value Ref Range   Alcohol, Ethyl (B) <10 <10 mg/dL    Comment: (NOTE) Lowest detectable limit for serum alcohol is 10  mg/dL. For medical purposes only. Performed at [facility], [place] [address] [place], [place] [address]   Urine rapid drug screen (hosp performed)     Status: Abnormal   Collection Time: 06/23/18  8:12 PM  Result Value Ref Range   Opiates (A)  NONE DETECTED    Result not available. Reagent lot number recalled by manufacturer.   Cocaine NONE DETECTED NONE DETECTED   Benzodiazepines POSITIVE (A) NONE DETECTED   Amphetamines NONE DETECTED NONE DETECTED   Tetrahydrocannabinol NONE DETECTED NONE DETECTED   Barbiturates NONE DETECTED NONE DETECTED    Comment: (NOTE) DRUG SCREEN FOR MEDICAL PURPOSES ONLY.  IF CONFIRMATION IS NEEDED FOR ANY PURPOSE, NOTIFY LAB WITHIN 5 DAYS. LOWEST DETECTABLE LIMITS FOR URINE DRUG SCREEN Drug Class                     Cutoff (ng/mL) Amphetamine and metabolites    1000 Barbiturate and metabolites    200 Benzodiazepine                 408 Tricyclics and metabolites     300 Opiates and metabolites        300 Cocaine and metabolites        300 THC                            50 Performed at [facility], [place] [address] [place], [place] [address]   CBG monitoring, ED     Status: None   Collection Time: 06/23/18  8:12 PM  Result Value Ref Range   Glucose-Capillary 77 70 - 99 mg/dL  I-Stat beta hCG blood, ED     Status: None   Collection Time: 06/23/18  8:17 PM  Result Value Ref Range   I-stat hCG, quantitative <5.0 <5 mIU/mL   Comment 3            Comment:   GEST. AGE      CONC.  (mIU/mL)   <=1 WEEK        5 - 50     2 WEEKS       50 - 500     3 WEEKS       100 - 10,000     4 WEEKS     1,000 - 30,000        FEMALE AND NON-PREGNANT FEMALE:     LESS THAN 5 mIU/mL     Current Facility-Administered Medications  Medication Dose Route Frequency Provider Last Rate Last Dose  . acetaminophen (TYLENOL) tablet 650 mg  650 mg Oral Q4H PRN Pattricia Boss, MD   650 mg at 06/24/18 0924  . alum & mag hydroxide-simeth  (MAALOX/MYLANTA) 200-200-20 MG/5ML suspension 30 mL  30 mL Oral Q6H PRN Pattricia Boss, MD      . ondansetron (ZOFRAN) tablet 4 mg  4 mg Oral Q8H PRN Pattricia Boss, MD      . risperiDONE (RISPERDAL M-TABS) disintegrating tablet 2 mg  2 mg Oral Q8H PRN Pattricia Boss, MD       And  . ziprasidone (GEODON) injection 20 mg  20 mg Intramuscular PRN Pattricia Boss, MD      . zolpidem (AMBIEN) tablet 5 mg  5 mg Oral QHS PRN Pattricia Boss, MD       Current Outpatient Medications  Medication Sig Dispense Refill  . famotidine (PEPCID) 20 MG tablet Take 20 mg by mouth 2 (two) times daily.    Marland Kitchen LORazepam (ATIVAN) 2 MG tablet Take 2 mg by mouth daily as needed for anxiety.       Musculoskeletal: Strength & Muscle Tone: within normal limits Gait & Station: normal Patient leans: N/A  Psychiatric Specialty Exam: Physical Exam  Nursing note and vitals reviewed. Constitutional: She is oriented to person, place, and time. She appears well-developed and well-nourished.  HENT:  Head: Normocephalic and atraumatic.  Neck: Normal range of motion.  Cardiovascular: Normal rate.  Respiratory: Effort normal.  Musculoskeletal: Normal range of motion.  Neurological: She is alert and oriented to person, place, and time.  Psychiatric: Her mood appears anxious. Her speech is rapid and/or pressured. She is actively hallucinating. Thought content is delusional. Cognition and memory are impaired. She expresses impulsivity. She is inattentive.    Review of Systems  Psychiatric/Behavioral: Positive for hallucinations. The patient is nervous/anxious.   All other systems reviewed and are negative.   Blood pressure 124/74, pulse 83, temperature 98.6 F (37 C), temperature source Oral, resp. rate 16, height _0  (1.473 m), weight 53.1 kg, SpO2 100 %.Body mass index is 24.45 kg/m.  General Appearance: Disheveled  Eye Contact:  Fair  Speech:  Slow  Volume:  Decreased  Mood:  Anxious  Affect:  Blunt  Thought Process:   Coherent and Descriptions of Associations: Tangential  Orientation:  Other:  person  Thought Content:  Delusions and Paranoid Ideation  Suicidal Thoughts:  No  Homicidal Thoughts:  No  Memory:  Immediate;   Fair Recent;   Fair Remote;   Fair  Judgement:  Impaired  Insight:  Lacking  Psychomotor Activity:  Decreased  Concentration:  Concentration: Fair and Attention Span: Fair  Recall:  [employer] of Knowledge:  Fair  Language:  Fair  Akathisia:  No  Handed:  Right  AIMS (if indicated):   N/A  Assets:  Leisure Time Physical Health Resilience Social Support  ADL's:  Intact  Cognition:  WNL  Sleep:   N/A     Treatment Plan Summary: Daily contact with patient to assess and evaluate symptoms and progress in treatment, Medication management and Plan bipolar affective disorder, mania, with psychosis: -Agitation medication PRN in place -Started Zyprexa 10 mg BID for psychosis  Disposition: Recommend psychiatric Inpatient admission when medically cleared.  Waylan Boga, NP 06/24/2018 11:10 AM   Patient seen face-to-face for psychiatric evaluation, chart reviewed and case discussed with the physician extender and developed treatment plan. Reviewed the information documented and agree with the treatment plan.  Buford Dresser, DO 06/24/18 10:08 PM

## 2018-06-24 NOTE — Progress Notes (Signed)
[name] ShengBillie is a 42 year old female pt admitted on voluntary basis. On admission she spoke about the reason she is here is because her apartment is not ready yet. She spoke about how she recently came to [place] from [place] and spoke about how she came here looking for a place to stay. She spoke about how she could not stay in [place] any longer and spoke about having some issues with the people she was staying with but would not elaborate. She reports that she was recently hospitalized in [place] and spoke about how she was on medications but reports that she does not want to take any medications and reports that none of the medications help her. She was seen talking to herself at times during the admission process and her thought content had some paranoid themes to it. She does report that she gets a disability check. She denies any substance abuse issues. She denies any SI,HI or A/V hallucinations. [name] ShengBillie was oriented to the milieu and safety maintained.

## 2018-06-25 DIAGNOSIS — F312 Bipolar disorder, current episode manic severe with psychotic features: Principal | ICD-10-CM

## 2018-06-25 MED ORDER — ZIPRASIDONE MESYLATE 20 MG IM SOLR
20.0000 mg | Freq: Two times a day (BID) | INTRAMUSCULAR | Status: DC | PRN
  Administered 2018-06-25: 20 mg via INTRAMUSCULAR
  Filled 2018-06-25 (×2): qty 20

## 2018-06-25 MED ORDER — OLANZAPINE 5 MG PO TBDP
5.0000 mg | ORAL_TABLET | Freq: Three times a day (TID) | ORAL | Status: DC | PRN
  Administered 2018-06-25: 5 mg via ORAL
  Filled 2018-06-25: qty 1

## 2018-06-25 MED ORDER — TRAZODONE HCL 50 MG PO TABS
50.0000 mg | ORAL_TABLET | Freq: Every evening | ORAL | Status: DC | PRN
  Administered 2018-06-25 – 2018-06-27 (×3): 50 mg via ORAL
  Filled 2018-06-25 (×3): qty 1

## 2018-06-25 NOTE — [facility] Group Notes (Signed)
LCSW Group Therapy Note   06/25/2018 1:15pm   Type of Therapy and Topic:  Group Therapy:  Positive Affirmations   Participation Level:  None  Description of Group: This group addressed positive affirmation toward self and others. Patients went around the room and identified two positive things about themselves and two positive things about a peer in the room. Patients reflected on how it felt to share something positive with others, to identify positive things about themselves, and to hear positive things from others. Patients were encouraged to have a daily reflection of positive characteristics or circumstances.  Therapeutic Goals 1. Patient will verbalize two of their positive qualities 2. Patient will demonstrate empathy for others by stating two positive qualities about a peer in the group 3. Patient will verbalize their feelings when voicing positive self affirmations and when voicing positive affirmations of others 4. Patients will discuss the potential positive impact on their wellness/recovery of focusing on positive traits of self and others. Summary of Patient Progress:  Made brief appearance.  Had nothing to contribute to conversation before she left.  Therapeutic Modalities Cognitive Behavioral Therapy Motivational Interviewing  [name] [name] Mildred Tuccillo, [place]CS[place] 06/25/2018 3:59 PM

## 2018-06-25 NOTE — Progress Notes (Signed)
Recreation Therapy Notes  Date: 8.20.19 Time: 1000 Location: 500 Hall Dayroom  Group Topic: Wellness  Goal Area(s) Addresses:  Patient will define components of whole wellness. Patient will verbalize benefit of whole wellness.  Intervention: Music  Activity: Exercise.  LRT lead patients in a series of stretches before starting the exercises.  Once stretched, each patient was given the opportunity to lead the group in an exercise.  The group completed a series of 3 rounds of exercises.  Education: Wellness, Building control surveyorDischarge Planning.   Education Outcome: Acknowledges education/In group clarification offered/Needs additional education.   Clinical Observations/Feedback: Pt did not attend group.    Caroll RancherMarjette Avana Kreiser, LRT/CTRS    Caroll RancherLindsay, Tyrina Hines A 06/25/2018 12:07 PM

## 2018-06-25 NOTE — Progress Notes (Signed)
Patient ID: Marcelyn DittyBillie Mctigue, female   DOB: 01/10/1976, 42 y.o.   MRN: [id number] Per State regulations 482.30 this chart was reviewed for medical necessity with respect to the patient's admission/duration of stay.    Next review date: 06/28/18  Thurman CoyerEric Sulema Braid, BSN, RN-BC  Case Manager

## 2018-06-25 NOTE — Progress Notes (Signed)
Patient ID: Marcelyn DittyBillie Penalver, female   DOB: 08/28/1976, 42 y.o.   MRN: [id number]  Patient began getting irritable earlier in shift and attempts to verbally redirect her by mutiple staff members at various times failed.  Pt's behaviors continued to escalate, as mood was labile, pt was agitated, intrusive, using profanity laced language towards staff and her peers.  Pt was offered PO medications for agitation, but refused, and behaviors continued.  Pt observed to be on the phone several times yelling and screaming. While attempting to use the phone on one episode, another pt was actively making a call, and pt screamed out loudly at the other pt to "get the fuck off that phone now!!", and added "I' ma knock the shit [id number] of you if you do not get off!".  Staff attempted to redirect pt who continued to shout out profanity at staff.  Pt was redirected to her room where she was given 20mg  of Geodon IM in right gluteal muscle. Pt took medication willingly without being combative. Dr. [place]e [place] notified of above.  Will continue to maintain on Q15 minute checks.

## 2018-06-25 NOTE — H&P (Signed)
Psychiatric Admission Assessment Adult  Patient Identification: [name] [name] MRN:  [id number] Date of Evaluation:  06/25/2018 Chief Complaint:  BIPOLAR 1 DISORDER MOST RECENT EPISODE MANIC Principal Diagnosis: Bipolar I disorder, current or most recent episode manic, with psychotic features (HCC) Diagnosis:   Patient Active Problem List   Diagnosis Date Noted  . Bipolar affective disorder, manic, severe, with psychotic behavior (HCC) [F31.2] 06/24/2018  . Bipolar I disorder, current or most recent episode manic, with psychotic features (HCC) [F31.2] 06/24/2018   History of Present Illness:   [name] [name] is a 42 y/o F with history of Bipolar disorder who was admitted on IVC from [facility] where she presented brought in by EMS and [facility] after she was observed wandering in parking lot with disorganized, manic behavior. Pt was religiously preoccupied and making statements that God was trying to kill her and she was trying to kill God. Pt was agitated and aggressive with EMS initially, and she required versed and haldol to manage her behaviors. Pt was medically cleared in the ED and then transferred to [facility] for additional treatment and stabilization.  Upon initial interview, pt shares, "I was traveling from [place], [place] to [place]. People were doing things to me, and they want me to make some money. I was staying at a motel and what I want to do is go back to school at [employer]." Pt why she chose the [place] area and she indicates her family is from this area but she was raised in [place] and most recently has been living there. She also shares that God has been tormenting her by "following me around and making me do things like hit myself." Pt endorses AH of hearing God say demeaning things to her. She denies command component. She denies SI/HI/VH. She reports her sleep has been poor. She endorses anhedonia, guilty feelings, low energy, poor concentration, and poor appetite. She denies symptoms  of mania, OCD, and PTSD. She denies all illicit substance use.  Discussed with patient about treatment options. She was started on regimen of zyprexa and she indicates she has taken that medication in the past. She is initially hesitant to continue it, but we discussed importance of reducing intensity of delusions and hallucinations, and pt was in agreement to remain on zyprexa. She had no further questions, comments, or concerns.   Associated Signs/Symptoms: Depression Symptoms:  depressed mood, anhedonia, insomnia, fatigue, feelings of worthlessness/guilt, difficulty concentrating, anxiety, panic attacks, decreased appetite, (Hypo) Manic Symptoms:  Delusions, Distractibility, Flight of Ideas, Hallucinations, Impulsivity, Irritable Mood, Labiality of Mood, Anxiety Symptoms:  Excessive Worry, Psychotic Symptoms:  Delusions, Hallucinations: Auditory Ideas of Reference, Paranoia, PTSD Symptoms: NA Total Time spent with patient: 1 hour  Past Psychiatric History:  - previous diagnosis of bipolar disorder - hx of inpt at [facility] in 2018 - no current outpatient provider - hx of suicide attempts via overdose and running into the street  Is the patient at risk to self? Yes.    Has the patient been a risk to self in the past 6 months? Yes.    Has the patient been a risk to self within the distant past? Yes.    Is the patient a risk to others? Yes.    Has the patient been a risk to others in the past 6 months? Yes.    Has the patient been a risk to others within the distant past? Yes.     Prior Inpatient Therapy:   Prior Outpatient Therapy:      Alcohol Screening: 1. How often do you have a drink containing alcohol?: Never 2. How many drinks containing alcohol do you have on a typical day when you are drinking?: 1 or 2 3. How often do you have six or more drinks on one occasion?: Never AUDIT-C Score: 0 4. How often during the last year have you found  that you were not able to stop drinking once you had started?: Never 5. How often during the last year have you failed to do what was normally expected from you becasue of drinking?: Never 6. How often during the last year have you needed a first drink in the morning to get yourself going after a heavy drinking session?: Never 7. How often during the last year have you had a feeling of guilt of remorse after drinking?: Never 8. How often during the last year have you been unable to remember what happened the night before because you had been drinking?: Never 9. Have you or someone else been injured as a result of your drinking?: No 10. Has a relative or friend or a doctor or another health worker been concerned about your drinking or suggested you cut down?: No Alcohol Use Disorder Identification Test Final Score (AUDIT): 0 Intervention/Follow-up: AUDIT Score <7 follow-up not indicated Substance Abuse History in the last 12 months:  No. Consequences of Substance Abuse: NA Previous Psychotropic Medications: Yes  Psychological Evaluations: Yes  Past Medical History: History reviewed. No pertinent past medical history. History reviewed. No pertinent surgical history. Family History: History reviewed. No pertinent family history. Family Psychiatric  History: denies family psychiatric history Tobacco Screening: Have you used any form of tobacco in the last 30 days? (Cigarettes, Smokeless Tobacco, Cigars, and/or Pipes): No Social History:  Pt was raised in [place], and most recently she was living in [place], [place]. She took a bus to [place] and she has been staying in a motel for a few days. She is not working and is on disability. She completed her bachelor's degree at [employer] in the past. She has 1 daughter whom lives with an aunt in [place]. She denies legal history.  Social History   Substance and Sexual Activity  Alcohol Use Not Currently     Social History   Substance and Sexual Activity   Drug Use Not Currently    Additional Social History:                           Allergies:  No Known Allergies Lab Results:  Results for orders placed or performed during the hospital encounter of 06/23/18 (from the past 48 hour(s))  Comprehensive metabolic panel     Status: None   Collection Time: 06/23/18  8:10 PM  Result Value Ref Range   Sodium 138 135 - 145 mmol/L   Potassium 3.6 3.5 - 5.1 mmol/L   Chloride 104 98 - 111 mmol/L   CO2 25 22 - 32 mmol/L   Glucose, Bld 84 70 - 99 mg/dL   BUN 7 6 - 20 mg/dL   Creatinine, Ser 0.63 0.44 - 1.00 mg/dL   Calcium 9.1 8.9 - 10.3 mg/dL   Total Protein 7.0 6.5 - 8.1 g/dL   Albumin 4.2 3.5 - 5.0 g/dL   AST 28 15 - 41 U/L   ALT 18 0 - 44 U/L   Alkaline Phosphatase 50 38 - 126 U/L   Total Bilirubin 0.8 0.3 - 1.2 mg/dL   GFR calc non  Af Amer >60 >60 mL/min   GFR calc Af Amer >60 >60 mL/min    Comment: (NOTE) The eGFR has been calculated using the CKD EPI equation. This calculation has not been validated in all clinical situations. eGFR's persistently <60 mL/min signify possible Chronic Kidney Disease.    Anion gap 9 5 - 15    Comment: Performed at [facility], [address] [place], [place] [address]  CBC with Diff     Status: Abnormal   Collection Time: 06/23/18  8:10 PM  Result Value Ref Range   WBC 5.3 4.0 - 10.5 K/uL   RBC 4.18 3.87 - 5.11 MIL/uL   Hemoglobin 11.7 (L) 12.0 - 15.0 g/dL   HCT 35.3 (L) 36.0 - 46.0 %   MCV 84.4 78.0 - 100.0 fL   MCH 28.0 26.0 - 34.0 pg   MCHC 33.1 30.0 - 36.0 g/dL   RDW 14.4 11.5 - 15.5 %   Platelets 415 (H) 150 - 400 K/uL   Neutrophils Relative % 51 %   Neutro Abs 2.7 1.7 - 7.7 K/uL   Lymphocytes Relative 36 %   Lymphs Abs 1.9 0.7 - 4.0 K/uL   Monocytes Relative 11 %   Monocytes Absolute 0.6 0.1 - 1.0 K/uL   Eosinophils Relative 2 %   Eosinophils Absolute 0.1 0.0 - 0.7 K/uL   Basophils Relative 0 %   Basophils Absolute 0.0 0.0 - 0.1 K/uL    Comment:  Performed at [facility], [address] [place], [place] [address]  Ethanol     Status: None   Collection Time: 06/23/18  8:11 PM  Result Value Ref Range   Alcohol, Ethyl (B) <10 <10 mg/dL    Comment: (NOTE) Lowest detectable limit for serum alcohol is 10 mg/dL. For medical purposes only. Performed at [facility], [address] [place], [place] [address]   Urine rapid drug screen (hosp performed)     Status: Abnormal   Collection Time: 06/23/18  8:12 PM  Result Value Ref Range   Opiates (A) NONE DETECTED    Result not available. Reagent lot number recalled by manufacturer.   Cocaine NONE DETECTED NONE DETECTED   Benzodiazepines POSITIVE (A) NONE DETECTED   Amphetamines NONE DETECTED NONE DETECTED   Tetrahydrocannabinol NONE DETECTED NONE DETECTED   Barbiturates NONE DETECTED NONE DETECTED    Comment: (NOTE) DRUG SCREEN FOR MEDICAL PURPOSES ONLY.  IF CONFIRMATION IS NEEDED FOR ANY PURPOSE, NOTIFY LAB WITHIN 5 DAYS. LOWEST DETECTABLE LIMITS FOR URINE DRUG SCREEN Drug Class                     Cutoff (ng/mL) Amphetamine and metabolites    1000 Barbiturate and metabolites    200 Benzodiazepine                 200 Tricyclics and metabolites     300 Opiates and metabolites        300 Cocaine and metabolites        300 THC                            50 Performed at [facility], [address] [place], [place] [address]   CBG monitoring, ED     Status: None   Collection Time: 06/23/18  8:12 PM  Result Value Ref Range   Glucose-Capillary 77 70 - 99 mg/dL  I-Stat beta   hCG blood, ED     Status: None   Collection Time: 06/23/18  8:17 PM  Result Value Ref Range   I-stat hCG, quantitative <5.0 <5 mIU/mL   Comment 3            Comment:   GEST. AGE      CONC.  (mIU/mL)   <=1 WEEK        5 - 50     2 WEEKS       50 - 500     3 WEEKS       100 - 10,000     4 WEEKS     1,000 - 30,000        FEMALE AND NON-PREGNANT  FEMALE:     LESS THAN 5 mIU/mL     Blood Alcohol level:  Lab Results  Component Value Date   ETH <10 06/23/2018    Metabolic Disorder Labs:  No results found for: HGBA1C, MPG No results found for: PROLACTIN No results found for: CHOL, TRIG, HDL, CHOLHDL, VLDL, LDLCALC  Current Medications: Current Facility-Administered Medications  Medication Dose Route Frequency Provider Last Rate Last Dose  . acetaminophen (TYLENOL) tablet 650 mg  650 mg Oral Q6H PRN Lord, Jamison Y, NP   650 mg at 06/25/18 0548  . alum & mag hydroxide-simeth (MAALOX/MYLANTA) 200-200-20 MG/5ML suspension 30 mL  30 mL Oral Q4H PRN Lord, Jamison Y, NP      . magnesium hydroxide (MILK OF MAGNESIA) suspension 30 mL  30 mL Oral Daily PRN Lord, Jamison Y, NP      . OLANZapine (ZYPREXA) tablet 10 mg  10 mg Oral BID Lord, Jamison Y, NP   10 mg at 06/25/18 0820  . OLANZapine zydis (ZYPREXA) disintegrating tablet 5 mg  5 mg Oral Q8H PRN ,  T, MD       Or  . ziprasidone (GEODON) injection 20 mg  20 mg Intramuscular Q12H PRN ,  T, MD   20 mg at 06/25/18 1124  . ondansetron (ZOFRAN) tablet 4 mg  4 mg Oral Q8H PRN Lord, Jamison Y, NP      . traZODone (DESYREL) tablet 50 mg  50 mg Oral QHS PRN,MR X 1 ,  T, MD       PTA Medications: Medications Prior to Admission  Medication Sig Dispense Refill Last Dose  . famotidine (PEPCID) 20 MG tablet Take 20 mg by mouth 2 (two) times daily.   UNK  . LORazepam (ATIVAN) 2 MG tablet Take 2 mg by mouth daily as needed for anxiety.    UNK    Musculoskeletal: Strength & Muscle Tone: within normal limits Gait & Station: normal Patient leans: N/A  Psychiatric Specialty Exam: Physical Exam  Nursing note and vitals reviewed.   Review of Systems  Constitutional: Negative for chills and fever.  Respiratory: Negative for cough and shortness of breath.   Cardiovascular: Negative for chest pain.  Gastrointestinal: Negative for  abdominal pain, heartburn, nausea and vomiting.  Psychiatric/Behavioral: Positive for depression and hallucinations. Negative for suicidal ideas. The patient is nervous/anxious and has insomnia.     Blood pressure 113/77, pulse 92, temperature 97.9 F (36.6 C), resp. rate 18, height 4' 10" (1.473 m), weight 47.2 kg.Body mass index is 21.74 kg/m.  General Appearance: Casual and Fairly Groomed  Eye Contact:  Good  Speech:  Clear and Coherent and Pressured  Volume:  Normal  Mood:  Anxious, Depressed and Irritable  Affect:  Blunt, Congruent and Labile    Thought Process:  Coherent, Disorganized and Descriptions of Associations: Tangential  Orientation:  Full (Time, Place, and Person)  Thought Content:  Delusions, Hallucinations: Auditory, Ideas of Reference:   Paranoia Delusions, Paranoid Ideation and Tangential  Suicidal Thoughts:  No  Homicidal Thoughts:  No  Memory:  Immediate;   Fair Recent;   Fair Remote;   Fair  Judgement:  Poor  Insight:  Lacking  Psychomotor Activity:  Normal  Concentration:  Concentration: Fair  Recall:  [employer] of Knowledge:  Fair  Language:  Fair  Akathisia:  No  Handed:    AIMS (if indicated):     Assets:  Resilience Social Support  ADL's:  Intact  Cognition:  WNL  Sleep:  Number of Hours: 6.25    Treatment Plan Summary: Daily contact with patient to assess and evaluate symptoms and progress in treatment and Medication management  Observation Level/Precautions:  15 minute checks  Laboratory:  CBC Chemistry Profile HbAIC HCG UDS UA  Psychotherapy:  Encourage participation in groups and therapeutic milieu   Medications:  Continue zyprexa 51m po BID. Continue PRN orders for agitation (zydis/geodon), continue trazodone 559mpo qhs prn insomnia.  Consultations:    Discharge Concerns:    Estimated LOS: 5-7 days  Other:     Physician Treatment Plan for Primary Diagnosis: Bipolar I disorder, current or most recent episode manic, with  psychotic features (HCD[place]moorLong Term Goal(s): Improvement in symptoms so as ready for discharge  Short Term Goals: Ability to demonstrate self-control will improve  Physician Treatment Plan for Secondary Diagnosis: Principal Problem:   Bipolar I disorder, current or most recent episode manic, with psychotic features ([facility]CL[place] Long Term Goal(s): Improvement in symptoms so as ready for discharge  Short Term Goals: Ability to identify and develop effective coping behaviors will improve  I certify that inpatient services furnished can reasonably be expected to improve the patient's condition.    ChPennelope BrackenMD 8/20/20192:40 PM

## 2018-06-25 NOTE — Progress Notes (Signed)
Recreation Therapy Notes  INPATIENT RECREATION THERAPY ASSESSMENT  Patient Details Name: Marcelyn DittyBillie Demeter MRN: [id number] DOB: 07/05/1976 Today's Date: 06/25/2018       Information Obtained From: Patient  Able to Participate in Assessment/Interview: Yes  Patient Presentation: Alert, Oriented  Reason for Admission (Per Patient): Other (Comments)(Stress)  Patient Stressors: Other (Comment)(Pt stated living in fast paced environment in [place] and the move down here.)  Coping Skills:   TV, Exercise, Music, Talk, Art, Other (Comment), Avoidance, Hot Bath/Shower(Shop)  Leisure Interests (2+):  Individual - TV, Individual - Other (Comment)(Cleanning; Partying)  Frequency of Recreation/Participation: Weekly(Pt stated she doesn't party much anymore.)  Awareness of Community Resources:  No  Community Resources:     Current Use:    If no, Barriers?:    Expressed Interest in [employer] CorporationCommunity Resource Information: Yes  County of Residence:  [place]  Patient Main Form of Transportation: Therapist, musicublic Transportation  Patient Strengths:  Theatre stage managerArtistic, Neat  Patient Identified Areas of Improvement:  Make more friends  Patient Goal for Hospitalization:  "Get better so I can get a job and go to school"  Current SI (including self-harm):  No  Current HI:  No  Current AVH: No  Staff Intervention Plan: Group Attendance, Collaborate with Interdisciplinary Treatment Team  Consent to Intern Participation: N/A   Caroll RancherMarjette Zharia Conrow, LRT/CTRS  Caroll RancherLindsay, Pansy Ostrovsky A 06/25/2018, 12:52 PM

## 2018-06-25 NOTE — [facility] Suicide Risk Assessment (Signed)
[facility]   Nursing information obtained from:  Patient Demographic factors:  Low socioeconomic status, Living alone, Unemployed Current Mental Status:  NA Loss Factors:  Financial problems / change in socioeconomic status Historical Factors:  Family history of mental illness or substance abuse Risk Reduction Factors:  Positive coping skills or problem solving skills  Total Time spent with patient: 30 minutes Principal Problem: Bipolar I disorder, current or most recent episode manic, with psychotic features (HCC) Diagnosis:   Patient Active Problem List   Diagnosis Date Noted  . Bipolar affective disorder, manic, severe, with psychotic behavior (HCC) [F31.2] 06/24/2018  . Bipolar I disorder, current or most recent episode manic, with psychotic features (HCC) [F31.2] 06/24/2018   Subjective Data: See H&P for details  Continued Clinical Symptoms:  Alcohol Use Disorder Identification Test Final Score (AUDIT): 0 The "Alcohol Use Disorders Identification Test", Guidelines for Use in Primary Care, Second Edition.  World Science writerHealth Organization [facility](WHO). Score between 0-7:  no or low risk or alcohol related problems. Score between 8-15:  moderate risk of alcohol related problems. Score between 16-19:  high risk of alcohol related problems. Score 20 or above:  warrants further diagnostic evaluation for alcohol dependence and treatment.   CLINICAL FACTORS:   Severe Anxiety and/or Agitation Bipolar Disorder:   Mixed State More than one psychiatric diagnosis Currently Psychotic Unstable or Poor Therapeutic Relationship Previous Psychiatric Diagnoses and Treatments  Psychiatric Specialty Exam: Physical Exam  Nursing note and vitals reviewed.     Blood pressure 113/77, pulse 92, temperature 97.9 F (36.6 C), resp. rate 18, height 4\' 10"  (1.473 m), weight 47.2 kg.Body mass index is 21.74 kg/m.   COGNITIVE FEATURES THAT CONTRIBUTE TO RISK:  Thought constriction  (tunnel vision)    SUICIDE RISK:   Minimal: No identifiable suicidal ideation.  Patients presenting with no risk factors but with morbid ruminations; may be classified as minimal risk based on the severity of the depressive symptoms  PLAN OF CARE: See H&P for details  I certify that inpatient services furnished can reasonably be expected to improve the patient's condition.   Micheal Likenshristopher T Nichalas Coin, MD 06/25/2018, 2:53 PM

## 2018-06-25 NOTE — Progress Notes (Signed)
D:  [name] [name] has been isolative to her room this evening.  She denied SI/HI or A/V hallucinations.  She refused to take her Zyprexa this evening and was unable to provide a reason.  She did not attend evening wrap up group.  She did request something for sleep and was given Ambien prn.  She woke up around 3am and requested more sleeping medication but was unable to provide at that time.  She went back to her room and is currently resting with her eyes closed.  She appears to be asleep. A:  1:1 with RN for support and encouragement.  Medications as ordered.  Q 15 minute checks maintained for safety.  Encouraged participation in group and unit activities.   R:  [name] remains safe on the unit.  We will continue to monitor the progress towards her goals.

## 2018-06-26 LAB — LIPID PANEL
CHOL/HDL RATIO: 3.2 ratio
CHOLESTEROL: 121 mg/dL (ref 0–200)
HDL: 38 mg/dL — AB (ref >40)
LDL CALC: 57 mg/dL (ref 0–99)
TRIGLYCERIDES: 128 mg/dL (ref <150)
VLDL: 26 mg/dL (ref 0–40)

## 2018-06-26 LAB — HEMOGLOBIN A1C
HEMOGLOBIN A1C: 4.7 % — AB (ref 4.8–5.6)
MEAN PLASMA GLUCOSE: 88.19 mg/dL

## 2018-06-26 MED ORDER — HYDROXYZINE HCL 50 MG PO TABS
50.0000 mg | ORAL_TABLET | Freq: Four times a day (QID) | ORAL | Status: DC | PRN
  Filled 2018-06-26: qty 10

## 2018-06-26 MED ORDER — HYDROXYZINE HCL 25 MG PO TABS
25.0000 mg | ORAL_TABLET | Freq: Four times a day (QID) | ORAL | Status: DC | PRN
  Administered 2018-06-26: 25 mg via ORAL
  Filled 2018-06-26: qty 1

## 2018-06-26 NOTE — Progress Notes (Signed)
Did not attend group 

## 2018-06-26 NOTE — [facility] Group Notes (Signed)
LCSW Group Therapy Note  06/26/2018 1:15pm    Type of Therapy and Topic:  Group Therapy:  Who Am I?  Self Esteem, Self-Actualization and Understanding Self    Participation Level:  Minimal  Description of Group:    In this group patients will be asked to explore values, beliefs, truths, and morals as they relate to personal self.  Patients will be guided to discuss their thoughts, feelings, and behaviors related to what they identify as important to their true self. Patients will process together how values, beliefs and truths are connected to specific choices patients make every day. Each patient will be challenged to identify changes that they are motivated to make in order to improve self-esteem and self-actualization. This group will be process-oriented, with patients participating in exploration of their own experiences, giving and receiving support, and processing challenge from other group members.   Therapeutic Goals: 1. Patient will identify false beliefs that currently interfere with their self-esteem.  2. Patient will identify feelings, thought process, and behaviors related to self and will become aware of the uniqueness of themselves and of others.  3. Patient will be able to identify and verbalize values, morals, and beliefs as they relate to self. 4. Patient will begin to learn how to build self-esteem/self-awareness by expressing what is important and unique to them personally.   Summary of Patient Progress  Present intially.  Left after 5 minutes. Returned.  Shared that she has a goal of getting into graduate school and getting her own place [place] in [place].  Was witnessed talking to herself or humming to herself through most of the group.   Therapeutic Modalities:   Cognitive Behavioral Therapy Solution Focused Therapy Motivational Interviewing Brief Therapy   Ida RogueRodney B Ezrah Panning, LCSW 06/26/2018 11:12 AM

## 2018-06-26 NOTE — [facility] Suicide Risk Assessment (Signed)
[facility] INPATIENT:  Family/Significant Other Suicide Prevention Education  Suicide Prevention Education:  Patient Refusal for Family/Significant Other Suicide Prevention Education: The patient [name] DittyBillie [name] has refused to provide written consent for family/significant other to be provided Family/Significant Other Suicide Prevention Education during admission and/or prior to discharge.  Physician notified.  Baldo DaubRodney B [facility] 06/26/2018, 4:29 PM

## 2018-06-26 NOTE — Progress Notes (Signed)
[facility] MD Progress Note  06/26/2018 4:13 PM [name] [name]  MRN:  [id number] Subjective:    [name] [name] is a 42 y/o F with history of Bipolar disorder who was admitted on IVC from [facility] where she presented brought in by EMS and [facility] after she was observed wandering in parking lot with disorganized, manic behavior. Pt was religiously preoccupied and making statements that God was trying to kill her and she was trying to kill God. Pt was agitated and aggressive with EMS initially, and she required versed and haldol to manage her behaviors. Pt was medically cleared in the ED and then transferred to [facility] for additional treatment and stabilization. Pt was continued on zyprexa and she was observed on the inpatient unit. She has continued to have episodic agitation and disorganized behaviors, and RN staff have observed her responding to internal stimuli.  Today upon evaluation, pt shares, "I want to be discharged so I can take care of my business." Pt seems mildly pressured and preoccupied with her focus on getting enrolled in classes in the [place] area. She denies physical complaints. She is minimal in her other concerns. RN staff recorded minimal to no sleep, but pt reports adequate sleep. She denies SI/HI/AH/VH. She denies delusional content that God is trying to kill her and/or God has been controlling her body. She is open to meeting with staff from transition care team from [facility] regarding outpatient follow up plan as pt has no stable housing/support in the [place] area. Pt continues to have ongoing episodic agitation and apparently responding to internal stimuli; she had mood lability after speaking on the phone this AM, and she began yelling and screaming. She was not able to be redirected and she required IM geodon to manage her behaviors.  Principal Problem: Bipolar I disorder, current or most recent episode manic, with psychotic features (HCC) Diagnosis:   Patient Active Problem List    Diagnosis Date Noted  . Bipolar affective disorder, manic, severe, with psychotic behavior (HCC) [F31.2] 06/24/2018  . Bipolar I disorder, current or most recent episode manic, with psychotic features (HCC) [F31.2] 06/24/2018   Total Time spent with patient: 30 minutes  Past Psychiatric History: see H&P  Past Medical History: History reviewed. No pertinent past medical history. History reviewed. No pertinent surgical history. Family History: History reviewed. No pertinent family history. Family Psychiatric  History: see H&P Social History:  Social History   Substance and Sexual Activity  Alcohol Use Not Currently     Social History   Substance and Sexual Activity  Drug Use Not Currently    Social History   Socioeconomic History  . Marital status: Unknown    Spouse name: Not on file  . Number of children: Not on file  . Years of education: Not on file  . Highest education level: Not on file  Occupational History  . Not on file  Social Needs  . Financial resource strain: Not on file  . Food insecurity:    Worry: Not on file    Inability: Not on file  . Transportation needs:    Medical: Not on file    Non-medical: Not on file  Tobacco Use  . Smoking status: Never Smoker  . Smokeless tobacco: Never Used  Substance and Sexual Activity  . Alcohol use: Not Currently  . Drug use: Not Currently  . Sexual activity: Not Currently  Lifestyle  . Physical activity:    Days per week: Not on file    Minutes per session:  Not on file  . Stress: Not on file  Relationships  . Social connections:    Talks on phone: Not on file    Gets together: Not on file    Attends religious service: Not on file    Active member of club or organization: Not on file    Attends meetings of clubs or organizations: Not on file    Relationship status: Not on file  Other Topics Concern  . Not on file  Social History Narrative  . Not on file   Additional Social History:                          Sleep: Poor  Appetite:  Good  Current Medications: Current Facility-Administered Medications  Medication Dose Route Frequency Provider Last Rate Last Dose  . acetaminophen (TYLENOL) tablet 650 mg  650 mg Oral Q6H PRN Charm Rings, NP   650 mg at 06/25/18 2256  . alum & mag hydroxide-simeth (MAALOX/MYLANTA) 200-200-20 MG/5ML suspension 30 mL  30 mL Oral Q4H PRN Charm Rings, NP      . hydrOXYzine (ATARAX/VISTARIL) tablet 25 mg  25 mg Oral Q6H PRN [name] A, NP   25 mg at 06/26/18 0159  . magnesium hydroxide (MILK OF MAGNESIA) suspension 30 mL  30 mL Oral Daily PRN Charm Rings, NP      . OLANZapine (ZYPREXA) tablet 10 mg  10 mg Oral BID Charm Rings, NP   10 mg at 06/26/18 0742  . OLANZapine zydis (ZYPREXA) disintegrating tablet 5 mg  5 mg Oral Q8H PRN Micheal Likens, MD   5 mg at 06/25/18 2226   Or  . ziprasidone (GEODON) injection 20 mg  20 mg Intramuscular Q12H PRN Micheal Likens, MD   20 mg at 06/25/18 1500  . ondansetron (ZOFRAN) tablet 4 mg  4 mg Oral Q8H PRN Charm Rings, NP      . traZODone (DESYREL) tablet 50 mg  50 mg Oral QHS PRN,MR X 1 Micheal Likens, MD   50 mg at 06/26/18 0159    Lab Results:  Results for orders placed or performed during the hospital encounter of 06/24/18 (from the past 48 hour(s))  Lipid panel     Status: Abnormal   Collection Time: 06/26/18  6:32 AM  Result Value Ref Range   Cholesterol 121 0 - 200 mg/dL   Triglycerides 161 <096 mg/dL   HDL 38 (L) >04 mg/dL   Total CHOL/HDL Ratio 3.2 RATIO   VLDL 26 0 - 40 mg/dL   LDL Cholesterol 57 0 - 99 mg/dL    Comment:        Total Cholesterol/HDL:CHD Risk Coronary Heart Disease Risk Table                     Men   Women  1/2 Average Risk   3.4   3.3  Average Risk       5.0   4.4  2 X Average Risk   9.6   7.1  3 X Average Risk  23.4   11.0        Use the calculated Patient Ratio above and the CHD Risk Table to determine the patient's CHD  Risk.        ATP III CLASSIFICATION (LDL):  <100     mg/dL   Optimal  540-981  mg/dL   Near or Above  Optimal  130-159  mg/dL   Borderline  [id number]  mg/dL   High  >045>190     mg/dL   Very High Performed at [facility], [address] [place], [place] [address]   Hemoglobin A1c     Status: Abnormal   Collection Time: 06/26/18  6:32 AM  Result Value Ref Range   Hgb A1c MFr Bld 4.7 (L) 4.8 - 5.6 %    Comment: (NOTE) Pre diabetes:          5.7%-6.4% Diabetes:              >6.4% Glycemic control for   <7.0% adults with diabetes    Mean Plasma Glucose 88.19 mg/dL    Comment: Performed at [facility], [address] [place], [place] [address]    Blood Alcohol level:  Lab Results  Component Value Date   ETH <10 06/23/2018    Metabolic Disorder Labs: Lab Results  Component Value Date   HGBA1C 4.7 (L) 06/26/2018   MPG 88.19 06/26/2018   No results found for: PROLACTIN Lab Results  Component Value Date   CHOL 121 06/26/2018   TRIG 128 06/26/2018   HDL 38 (L) 06/26/2018   CHOLHDL 3.2 06/26/2018   VLDL 26 06/26/2018   LDLCALC 57 06/26/2018    Physical Findings: AIMS: Facial and Oral Movements Muscles of Facial Expression: None, normal Lips and Perioral Area: None, normal Jaw: None, normal Tongue: None, normal,Extremity Movements Upper (arms, wrists, hands, fingers): None, normal Lower (legs, knees, ankles, toes): None, normal, Trunk Movements Neck, shoulders, hips: None, normal, Overall Severity Severity of abnormal movements (highest score from questions above): None, normal Incapacitation due to abnormal movements: None, normal Patient's awareness of abnormal movements (rate only patient's report): No Awareness, Dental Status Current problems with teeth and/or dentures?: No Does patient usually wear dentures?: No  CIWA:    COWS:     Musculoskeletal: Strength & Muscle Tone: within normal limits Gait & Station:  normal Patient leans: N/A  Psychiatric Specialty Exam: Physical Exam  Nursing note and vitals reviewed.   Review of Systems  Constitutional: Negative for chills and fever.  Respiratory: Negative for cough and shortness of breath.   Cardiovascular: Negative for chest pain.  Gastrointestinal: Negative for abdominal pain, heartburn, nausea and vomiting.  Psychiatric/Behavioral: Positive for hallucinations. Negative for depression and suicidal ideas. The patient is nervous/anxious. The patient does not have insomnia.     Blood pressure 111/81, pulse 87, temperature 98.3 F (36.8 C), temperature source Oral, resp. rate 16, height 4\' 10"  (1.473 m), weight 47.2 kg.Body mass index is 21.74 kg/m.  General Appearance: Casual and Fairly Groomed  Eye Contact:  Good  Speech:  Clear and Coherent and Normal Rate  Volume:  Normal  Mood:  Anxious and Dysphoric  Affect:  Blunt and Labile  Thought Process:  Coherent, Disorganized and Descriptions of Associations: Loose  Orientation:  Full (Time, Place, and Person)  Thought Content:  Delusions, Ideas of Reference:   Paranoia Delusions and Rumination  Suicidal Thoughts:  No  Homicidal Thoughts:  No  Memory:  Immediate;   Fair Recent;   Fair Remote;   Fair  Judgement:  Poor  Insight:  Lacking  Psychomotor Activity:  Normal  Concentration:  Concentration: Fair  Recall:  Fis[employer]Fair  Fund of Knowledge:  Fair  Language:  Fair  Akathisia:  No  Handed:    AIMS (if indicated):     Assets:  Resilience Social Support  ADL's:  Intact  Cognition:  WNL  Sleep:  Number of Hours: 0   Treatment Plan Summary: Daily contact with patient to assess and evaluate symptoms and progress in treatment and Medication management   -Continue inpatient hospitalization  -Bipolar I, current episode manic with psychotic features   -Continue zyprexa 10mg  po BID  -anxiety    -Continue vistaril 50mg  po q6h prn anxiety  -agitation      -Continue zydis 5mg  po q8h prn  agitation   -Continue geodon 20mg  IM q12h prn severe agitation  -insomnia   -Continue trazodone 50mg  po qhs prn insomnia (may repeat x1)  -Encourage participation in groups and therapeutic milieu  -disposition planning will be ongoing  Micheal Likenshristopher T Maybel Dambrosio, MD 06/26/2018, 4:13 PM

## 2018-06-26 NOTE — Progress Notes (Signed)
Patient ID: [name] [name], female   DOB: 06/26/1976, 42 y.o.   MRN: [id number] DAR Note: Pt continue to be very needy. Pt was continually observed taking loud on the phone. Pt was also observed on several occasions having intense conversation with self. Pt endorsed moderate anxiety and mild pain however; denied SI/HI, AVH or depression. Pt was med compliant. All patient's questions and concerns addressed. Support, encouragement, and safe environment provided. 15-minute safety checks continue.  Pt attended wrap-up group.

## 2018-06-26 NOTE — Tx Team (Signed)
Interdisciplinary Treatment and Diagnostic Plan Update  06/26/2018 Time of Session: 9:43 AM  [name] [name] MRN: [id number]  Principal Diagnosis: Bipolar I disorder, current or most recent episode manic, with psychotic features ([place])  Secondary Diagnoses: Principal Problem:   Bipolar I disorder, current or most recent episode manic, with psychotic features ([place])   Current Medications:  Current Facility-Administered Medications  Medication Dose Route Frequency Provider Last Rate Last Dose  . acetaminophen (TYLENOL) tablet 650 mg  650 mg Oral Q6H PRN Patrecia Pour, NP   650 mg at 06/25/18 2256  . alum & mag hydroxide-simeth (MAALOX/MYLANTA) 200-200-20 MG/5ML suspension 30 mL  30 mL Oral Q4H PRN Patrecia Pour, NP      . hydrOXYzine (ATARAX/VISTARIL) tablet 25 mg  25 mg Oral Q6H PRN Lindon Romp A, NP   25 mg at 06/26/18 0159  . magnesium hydroxide (MILK OF MAGNESIA) suspension 30 mL  30 mL Oral Daily PRN Patrecia Pour, NP      . OLANZapine (ZYPREXA) tablet 10 mg  10 mg Oral BID Patrecia Pour, NP   10 mg at 06/26/18 0742  . OLANZapine zydis (ZYPREXA) disintegrating tablet 5 mg  5 mg Oral Q8H PRN Pennelope Bracken, MD   5 mg at 06/25/18 2226   Or  . ziprasidone (GEODON) injection 20 mg  20 mg Intramuscular Q12H PRN Pennelope Bracken, MD   20 mg at 06/25/18 1500  . ondansetron (ZOFRAN) tablet 4 mg  4 mg Oral Q8H PRN Patrecia Pour, NP      . traZODone (DESYREL) tablet 50 mg  50 mg Oral QHS PRN,MR X 1 Pennelope Bracken, MD   50 mg at 06/26/18 0159    PTA Medications: Medications Prior to Admission  Medication Sig Dispense Refill Last Dose  . famotidine (PEPCID) 20 MG tablet Take 20 mg by mouth 2 (two) times daily.   UNK  . LORazepam (ATIVAN) 2 MG tablet Take 2 mg by mouth daily as needed for anxiety.    UNK    Patient Stressors: Medication change or noncompliance Other: housing issues  Patient Strengths: Ability for insight Average or above average  intelligence Capable of independent living  Treatment Modalities: Medication Management, Group therapy, Case management,  1 to 1 session with clinician, Psychoeducation, Recreational therapy.   Physician Treatment Plan for Primary Diagnosis: Bipolar I disorder, current or most recent episode manic, with psychotic features ([place]) Long Term Goal(s): Improvement in symptoms so as ready for discharge  Short Term Goals: Ability to demonstrate self-control will improve Ability to identify and develop effective coping behaviors will improve  Medication Management: Evaluate patient's response, side effects, and tolerance of medication regimen.  Therapeutic Interventions: 1 to 1 sessions, Unit Group sessions and Medication administration.  Evaluation of Outcomes: Progressing  Physician Treatment Plan for Secondary Diagnosis: Principal Problem:   Bipolar I disorder, current or most recent episode manic, with psychotic features ([place])   Long Term Goal(s): Improvement in symptoms so as ready for discharge  Short Term Goals: Ability to demonstrate self-control will improve Ability to identify and develop effective coping behaviors will improve  Medication Management: Evaluate patient's response, side effects, and tolerance of medication regimen.  Therapeutic Interventions: 1 to 1 sessions, Unit Group sessions and Medication administration.  Evaluation of Outcomes: Progressing   RN Treatment Plan for Primary Diagnosis: Bipolar I disorder, current or most recent episode manic, with psychotic features ([place]) Long Term Goal(s): Knowledge of disease and therapeutic regimen to maintain health  will improve  Short Term Goals: Ability to identify and develop effective coping behaviors will improve and Compliance with prescribed medications will improve  Medication Management: RN will administer medications as ordered by provider, will assess and evaluate patient's response and provide education to  patient for prescribed medication. RN will report any adverse and/or side effects to prescribing provider.  Therapeutic Interventions: 1 on 1 counseling sessions, Psychoeducation, Medication administration, Evaluate responses to treatment, Monitor vital signs and CBGs as ordered, Perform/monitor CIWA, COWS, AIMS and Fall Risk screenings as ordered, Perform wound care treatments as ordered.  Evaluation of Outcomes: Progressing   LCSW Treatment Plan for Primary Diagnosis: Bipolar I disorder, current or most recent episode manic, with psychotic features ([place]) Long Term Goal(s): Safe transition to appropriate next level of care at discharge, Engage patient in therapeutic group addressing interpersonal concerns.  Short Term Goals: Engage patient in aftercare planning with referrals and resources  Therapeutic Interventions: Assess for all discharge needs, 1 to 1 time with Social worker, Explore available resources and support systems, Assess for adequacy in community support network, Educate family and significant other(s) on suicide prevention, Complete Psychosocial Assessment, Interpersonal group therapy.  Evaluation of Outcomes: Met  Return to [facility], follow up with [facility] and [facility]   Progress in Treatment: Attending groups: Yes Participating in groups: Yes Taking medication as prescribed: Yes Toleration medication: Yes, no side effects reported at this time Family/Significant other contact made: No Patient understands diagnosis: Yes AEB asking for help with getting on meds Discussing patient identified problems/goals with staff: Yes Medical problems stabilized or resolved: Yes Denies suicidal/homicidal ideation: Yes Issues/concerns per patient self-inventory: None Other: N/A  New problem(s) identified: None identified at this time.   New Short Term/Long Term Goal(s): "Get out of here ASAP and getting my meds straight."   Discharge Plan or Barriers:   Reason for Continuation of  Hospitalization: Agitation Hallucinations  Medication stabilization   Estimated Length of Stay: 8/26  Attendees: Patient: [name] [name] 06/26/2018  9:43 AM  Physician: Maris Berger, MD 06/26/2018  9:43 AM  Nursing: Elesa Massed, RN 06/26/2018  9:43 AM  RN Care Manager: Lars Pinks, RN 06/26/2018  9:43 AM  Social Worker: Ripley Fraise 06/26/2018  9:43 AM  Recreational Therapist: Winfield Cunas 06/26/2018  9:43 AM  Other: Norberto Sorenson 06/26/2018  9:43 AM  Other:  06/26/2018  9:43 AM    Scribe for Treatment Team:  Roque Lias LCSW 06/26/2018 9:43 AM

## 2018-06-26 NOTE — Progress Notes (Signed)
D:  She was visible on the unit at the nurses station. No interaction with peers.  She didn't attend evening wrap up group.  She isolates to her room.  She denied SI/HI or A/V hallucinations but she appeared to be responding to internal stimuli.  She denied any pain or discomfort and appeared to be in no physical distress.  Talked with her about taking something for sleep tonight but she declined although she didn't sleep well.  Informed her that RN will have trazodone ready tonight if needed. A:  1:1 with RN for support and encouragement.  Medications as ordered.  Q 15 minute checks maintained for safety.  Encouraged participation in group and unit activities.   R:  [name] [name] remains safe on the unit.  We will continue to monitor the progress towards her goals.

## 2018-06-26 NOTE — Progress Notes (Signed)
Recreation Therapy Notes  Date: 8.21.19 Time: 1000 Location: 500 Hall Dayroom  Group Topic: Communication  Goal Area(s) Addresses:  Patient will effectively communicate with peers in group.  Patient will verbalize benefit of healthy communication. Patient will verbalize positive effect of healthy communication on post d/c goals.  Patient will identify communication techniques that made activity effective for group.   Behavioral Response: Engaged  Intervention: Paper, pencils, geometrical shapes  Activity: Geometrical Designs.  Patients were split into groups of 2.  One person was the speaker and the other the listener.  The speaker had to describe the picture they were given to their partner.  Their partner would then draw the picture that was being described.  The listener was no allowed to ask any questions during the activity.  Education: Communication, Discharge Planning  Education Outcome: Acknowledges understanding/In group clarification offered/Needs additional education.   Clinical Observations/Feedback:  Pt stated her partner gave clear instructions but talked fast.  Pt stated she gave the right instructions but was talking low.  Pt was talking to herself throughout group but was able to be redirected.    Caroll RancherMarjette Taylormarie Register, LRT/CTRS     Caroll RancherLindsay, Yasuko Lapage A 06/26/2018 11:49 AM

## 2018-06-26 NOTE — Plan of Care (Signed)
  Problem: Activity: Goal: Sleeping patterns will improve Outcome: Not Progressing-Alvie continues to have difficulty sleeping and is refusing to take sleeping medications.

## 2018-06-27 LAB — PROLACTIN: PROLACTIN: 28.9 ng/mL — AB (ref 4.8–23.3)

## 2018-06-27 MED ORDER — LORAZEPAM 2 MG/ML IJ SOLN: 1.0000 mg | Freq: Four times a day (QID) | INTRAMUSCULAR | Status: DC | PRN

## 2018-06-27 MED ORDER — LORAZEPAM 1 MG PO TABS: 1.0000 mg | ORAL_TABLET | Freq: Four times a day (QID) | ORAL | Status: DC | PRN

## 2018-06-27 MED ORDER — LITHIUM CARBONATE ER 450 MG PO TBCR
450.0000 mg | EXTENDED_RELEASE_TABLET | Freq: Two times a day (BID) | ORAL | Status: DC
  Filled 2018-06-27 (×6): qty 1

## 2018-06-27 MED ORDER — OLANZAPINE 10 MG PO TABS
10.0000 mg | ORAL_TABLET | Freq: Two times a day (BID) | ORAL | Status: DC
  Administered 2018-06-27 – 2018-06-28 (×2): 10 mg via ORAL
  Filled 2018-06-27 (×4): qty 1
  Filled 2018-06-27 (×2): qty 14
  Filled 2018-06-27: qty 1

## 2018-06-27 NOTE — Progress Notes (Signed)
Recreation Therapy Notes  Date: 8.22.19 Time: 1000 Location: 500 Hall Dayroom  Group Topic: Self-Esteem  Goal Area(s) Addresses:  Patient will successfully identify positive attributes about themselves.  Patient will successfully identify benefit of improved self-esteem.   Behavioral Response: Engaged  Intervention: Magazines, scissors, glue sticks, Holiday representativeconstruction paper, music  Activity: Brochure About Me.  Patients were to create a brochure that described their uniqueness and any positive characteristics about them.  LRT played music in the background as patients worked on their brochures.   Education:  Self-Esteem, Building control surveyorDischarge Planning.   Education Outcome: Acknowledges education/In group clarification offered/Needs additional education  Clinical Observations/Feedback: Pt arrived late but was active in group.  Pt stated she went to a [employer], is a Consulting civil engineerstudent, mother, likes to clean and is a "responsible African American".   [name] RancherMarjette Sallee [name], LRT/CTRS         [name] RancherLindsay, Salah Nakamura A 06/27/2018 12:15 PM

## 2018-06-27 NOTE — Progress Notes (Signed)
Adult Psychoeducational Group Note  Date:  06/27/2018 Time:  9:13 PM  Group Topic/Focus:  Wrap-Up Group:   The focus of this group is to help patients review their daily goal of treatment and discuss progress on daily workbooks.  Participation Level:  Active  Participation Quality:  Appropriate  Affect:  Appropriate  Cognitive:  Appropriate  Insight: Appropriate  Engagement in Group:  Engaged  Modes of Intervention:  Discussion  Additional Comments: The patient expressed that she rates today a 7.The patient also said that she attended group and her goal is to prepare for discharge.  Octavio Mannshigpen, Teah Votaw Lee 06/27/2018, 9:13 PM

## 2018-06-27 NOTE — Progress Notes (Addendum)
[name] [name] has been up and down all night.  She has come up multiple times to drink water excessively.  She was noted talking to herself.  She continued to refuse sleeping pill.  She would stand and stare at staff at the nurses station and then go back to her room.

## 2018-06-27 NOTE — Progress Notes (Signed)
[facility] MD Progress Note  06/27/2018 10:06 AM [name] [name]  MRN:  [id number] Subjective:    [name] [name] is a 42 y/o F with history of Bipolar disorder who was admitted on IVC from [facility] where she presented brought in by EMS and [facility] after she was observed wandering in parking lot with disorganized, manic behavior. Pt was religiously preoccupied and making statements that God was trying to kill her and she was trying to kill God. Pt was agitated and aggressive with EMS initially, and she required versed and haldol to manage her behaviors. Pt was medically cleared in the ED and then transferred to [facility] for additional treatment and stabilization. Pt was continued on zyprexa and she was observed on the inpatient unit. She has continued to have episodic agitation and disorganized behaviors, and RN staff have observed her responding to internal stimuli.  Today upon evaluation, pt shares, "I'm okay." She reports that overall her mood is doing well and she is looking forward to speaking with the transition care team because "I don't have a lot of support." Pt reports her sleep was improved last night compared to the night before, but it remains just fair. She denies SI/HI/AH/VH. She denies feeling that God is taking over her body. She had episode of mood lability, yelling, tearfulness, and difficult to be redirected yesterday which required IM geodon to manage, and discussed with patient that we could start a mood stabilizer, and pt was in agreement. She reports previous trials of lithium and depakote, and she prefers to start lithium. Pt was in agreement with the above plan, and she had no further questions, comments, or concerns.   Principal Problem: Bipolar I disorder, current or most recent episode manic, with psychotic features (HCC) Diagnosis:   Patient Active Problem List   Diagnosis Date Noted  . Bipolar affective disorder, manic, severe, with psychotic behavior (HCC) [F31.2] 06/24/2018  . Bipolar I  disorder, current or most recent episode manic, with psychotic features (HCC) [F31.2] 06/24/2018   Total Time spent with patient: 30 minutes  Past Psychiatric History: see H&P  Past Medical History: History reviewed. No pertinent past medical history. History reviewed. No pertinent surgical history. Family History: History reviewed. No pertinent family history. Family Psychiatric  History: see H&P Social History:  Social History   Substance and Sexual Activity  Alcohol Use Not Currently     Social History   Substance and Sexual Activity  Drug Use Not Currently    Social History   Socioeconomic History  . Marital status: Unknown    Spouse name: Not on file  . Number of children: Not on file  . Years of education: Not on file  . Highest education level: Not on file  Occupational History  . Not on file  Social Needs  . Financial resource strain: Not on file  . Food insecurity:    Worry: Not on file    Inability: Not on file  . Transportation needs:    Medical: Not on file    Non-medical: Not on file  Tobacco Use  . Smoking status: Never Smoker  . Smokeless tobacco: Never Used  Substance and Sexual Activity  . Alcohol use: Not Currently  . Drug use: Not Currently  . Sexual activity: Not Currently  Lifestyle  . Physical activity:    Days per week: Not on file    Minutes per session: Not on file  . Stress: Not on file  Relationships  . Social connections:    Talks  on phone: Not on file    Gets together: Not on file    Attends religious service: Not on file    Active member of club or organization: Not on file    Attends meetings of clubs or organizations: Not on file    Relationship status: Not on file  Other Topics Concern  . Not on file  Social History Narrative  . Not on file   Additional Social History:                         Sleep: Poor  Appetite:  Fair  Current Medications: Current Facility-Administered Medications  Medication Dose  Route Frequency Provider Last Rate Last Dose  . acetaminophen (TYLENOL) tablet 650 mg  650 mg Oral Q6H PRN Charm Rings, NP   650 mg at 06/25/18 2256  . alum & mag hydroxide-simeth (MAALOX/MYLANTA) 200-200-20 MG/5ML suspension 30 mL  30 mL Oral Q4H PRN Charm Rings, NP      . hydrOXYzine (ATARAX/VISTARIL) tablet 50 mg  50 mg Oral Q6H PRN Micheal Likens, MD      . magnesium hydroxide (MILK OF MAGNESIA) suspension 30 mL  30 mL Oral Daily PRN Charm Rings, NP      . OLANZapine (ZYPREXA) tablet 10 mg  10 mg Oral BID Charm Rings, NP   10 mg at 06/27/18 1610  . OLANZapine zydis (ZYPREXA) disintegrating tablet 5 mg  5 mg Oral Q8H PRN Micheal Likens, MD   5 mg at 06/25/18 2226   Or  . ziprasidone (GEODON) injection 20 mg  20 mg Intramuscular Q12H PRN Micheal Likens, MD   20 mg at 06/25/18 1500  . ondansetron (ZOFRAN) tablet 4 mg  4 mg Oral Q8H PRN Charm Rings, NP      . traZODone (DESYREL) tablet 50 mg  50 mg Oral QHS PRN,MR X 1 Micheal Likens, MD   50 mg at 06/26/18 0159    Lab Results:  Results for orders placed or performed during the hospital encounter of 06/24/18 (from the past 48 hour(s))  Lipid panel     Status: Abnormal   Collection Time: 06/26/18  6:32 AM  Result Value Ref Range   Cholesterol 121 0 - 200 mg/dL   Triglycerides 960 <454 mg/dL   HDL 38 (L) >09 mg/dL   Total CHOL/HDL Ratio 3.2 RATIO   VLDL 26 0 - 40 mg/dL   LDL Cholesterol 57 0 - 99 mg/dL    Comment:        Total Cholesterol/HDL:CHD Risk Coronary Heart Disease Risk Table                     Men   Women  1/2 Average Risk   3.4   3.3  Average Risk       5.0   4.4  2 X Average Risk   9.6   7.1  3 X Average Risk  23.4   11.0        Use the calculated Patient Ratio above and the CHD Risk Table to determine the patient's CHD Risk.        ATP III CLASSIFICATION (LDL):  <100     mg/dL   Optimal  811-914  mg/dL   Near or Above                    Optimal  130-159   mg/dL  Borderline  160-189  mg/dL   High  >865     mg/dL   Very High Performed at [facility], [address] [place], [place] [address]   Hemoglobin A1c     Status: Abnormal   Collection Time: 06/26/18  6:32 AM  Result Value Ref Range   Hgb A1c MFr Bld 4.7 (L) 4.8 - 5.6 %    Comment: (NOTE) Pre diabetes:          5.7%-6.4% Diabetes:              >6.4% Glycemic control for   <7.0% adults with diabetes    Mean Plasma Glucose 88.19 mg/dL    Comment: Performed at [facility], [address] [place], [place] [address]  Prolactin     Status: Abnormal   Collection Time: 06/26/18  6:32 AM  Result Value Ref Range   Prolactin 28.9 (H) 4.8 - 23.3 ng/mL    Comment: (NOTE) Performed At: [facility] [address] [place], [place] [id number] Jolene Schimke MD UU:[id number]     Blood Alcohol level:  Lab Results  Component Value Date   [facility] <10 06/23/2018    Metabolic Disorder Labs: Lab Results  Component Value Date   HGBA1C 4.7 (L) 06/26/2018   MPG 88.19 06/26/2018   Lab Results  Component Value Date   PROLACTIN 28.9 (H) 06/26/2018   Lab Results  Component Value Date   CHOL 121 06/26/2018   TRIG 128 06/26/2018   HDL 38 (L) 06/26/2018   CHOLHDL 3.2 06/26/2018   VLDL 26 06/26/2018   LDLCALC 57 06/26/2018    Physical Findings: AIMS: Facial and Oral Movements Muscles of Facial Expression: None, normal Lips and Perioral Area: None, normal Jaw: None, normal Tongue: None, normal,Extremity Movements Upper (arms, wrists, hands, fingers): None, normal Lower (legs, knees, ankles, toes): None, normal, Trunk Movements Neck, shoulders, hips: None, normal, Overall Severity Severity of abnormal movements (highest score from questions above): None, normal Incapacitation due to abnormal movements: None, normal Patient's awareness of abnormal movements (rate only patient's report): No Awareness, Dental Status Current problems with teeth and/or  dentures?: No Does patient usually wear dentures?: No  CIWA:    COWS:     Musculoskeletal: Strength & Muscle Tone: within normal limits Gait & Station: normal Patient leans: N/A  Psychiatric Specialty Exam: Physical Exam  Nursing note and vitals reviewed.   Review of Systems  Constitutional: Negative for chills and fever.  Respiratory: Negative for cough and shortness of breath.   Cardiovascular: Negative for chest pain.  Gastrointestinal: Negative for abdominal pain, heartburn, nausea and vomiting.  Psychiatric/Behavioral: Negative for depression, hallucinations and suicidal ideas. The patient is nervous/anxious and has insomnia.     Blood pressure 116/77, pulse (!) 109, temperature 99.1 F (37.3 C), temperature source Oral, resp. rate 16, height 4\' 10"  (1.473 m), weight 47.2 kg.Body mass index is 21.74 kg/m.  General Appearance: Casual and Fairly Groomed  Eye Contact:  Good  Speech:  Clear and Coherent and Normal Rate  Volume:  Normal  Mood:  Anxious and Depressed  Affect:  Appropriate, Congruent, Constricted and Flat  Thought Process:  Coherent and Goal Directed  Orientation:  Full (Time, Place, and Person)  Thought Content:  Logical  Suicidal Thoughts:  No  Homicidal Thoughts:  No  Memory:  Immediate;   Fair Recent;   Fair Remote;   Fair  Judgement:  Fair  Insight:  Lacking  Psychomotor Activity:  Normal  Concentration:  Concentration: Fair  Recall:  [employer]Fair  Fund of Knowledge:  Fair  Language:  Fair  Akathisia:  No  Handed:    AIMS (if indicated):     Assets:  Resilience Social Support  ADL's:  Intact  Cognition:  WNL  Sleep:  Number of Hours: 4   Treatment Plan Summary: Daily contact with patient to assess and evaluate symptoms and progress in treatment and Medication management   -Continue inpatient hospitalization  -Bipolar I, current episode manic with psychotic features             -Continue zyprexa 10mg  po BID     -Start lithium CR 450mg  po BID    -Lithium level on 8/26 AM  -anxiety                        -Continue vistaril 50mg  po q6h prn anxiety  -agitation                      -Continue zydis 5mg  po q8h prn agitation             -Continue geodon 20mg  IM q12h prn severe agitation  -insomnia              -Continue trazodone 50mg  po qhs prn insomnia (may repeat x1)  -Encourage participation in groups and therapeutic milieu  -disposition planning will be ongoing  Micheal Likenshristopher T Deandra Goering, MD 06/27/2018, 10:06 AM

## 2018-06-27 NOTE — BHH Group Notes (Signed)
LCSW Group Therapy Note  06/27/2018 1:15pm  Type of Therapy/Topic:  Group Therapy:  Balance in Life  Participation Level:  Minimal  Description of Group:    This group will address the concept of balance and how it feels and looks when one is unbalanced. Patients will be encouraged to process areas in their lives that are out of balance and identify reasons for remaining unbalanced. Facilitators will guide patients in utilizing problem-solving interventions to address and correct the stressor making their life unbalanced. Understanding and applying boundaries will be explored and addressed for obtaining and maintaining a balanced life. Patients will be encouraged to explore ways to assertively make their unbalanced needs known to significant others in their lives, using other group members and facilitator for support and feedback.  Therapeutic Goals: 1. Patient will identify two or more emotions or situations they have that consume much of in their lives. 2. Patient will identify signs/triggers that life has become out of balance:  3. Patient will identify two ways to set boundaries in order to achieve balance in their lives:  4. Patient will demonstrate ability to communicate their needs through discussion and/or role plays  Summary of Patient ProgressIon:  In and out multiple times, per usual.  Stated she feels emotionally balanced today "because I am not up and down.  [facility] me?"    Therapeutic Modalities:   Cognitive Behavioral Therapy Solution-Focused Therapy Assertiveness Training  [name] [name], [place] 06/27/2018 4:23 PM

## 2018-06-27 NOTE — Plan of Care (Signed)
  Problem: Education: Goal: Knowledge of [facility] information/materials will improve Outcome: Progressing Goal: Emotional status will improve Outcome: Progressing Goal: Mental status will improve Outcome: Progressing Goal: Verbalization of understanding the information provided will improve Outcome: Progressing   Problem: Activity: Goal: Interest or engagement in activities will improve Outcome: Progressing Goal: Sleeping patterns will improve Outcome: Progressing   Problem: Coping: Goal: Ability to verbalize frustrations and anger appropriately will improve Outcome: Progressing Goal: Ability to demonstrate self-control will improve Outcome: Progressing   Problem: Health Behavior/Discharge Planning: Goal: Identification of resources available to assist in meeting health care needs will improve Outcome: Progressing Goal: Compliance with treatment plan for underlying cause of condition will improve Outcome: Progressing   Problem: Physical Regulation: Goal: Ability to maintain clinical measurements within normal limits will improve Outcome: Progressing   Problem: Safety: Goal: Periods of time without injury will increase Outcome: Progressing   Problem: Activity: Goal: Will verbalize the importance of balancing activity with adequate rest periods Outcome: Progressing   Problem: Education: Goal: Will be free of psychotic symptoms Outcome: Progressing Goal: Knowledge of the prescribed therapeutic regimen will improve Outcome: Progressing   Problem: Coping: Goal: Coping ability will improve Outcome: Progressing Goal: Will verbalize feelings Outcome: Progressing   Problem: Health Behavior/Discharge Planning: Goal: Compliance with prescribed medication regimen will improve Outcome: Progressing   Problem: Nutritional: Goal: Ability to achieve adequate nutritional intake will improve Outcome: Progressing   Problem: Role Relationship: Goal:  Ability to communicate needs accurately will improve Outcome: Progressing Goal: Ability to interact with others will improve Outcome: Progressing   Problem: Safety: Goal: Ability to redirect hostility and anger into socially appropriate behaviors will improve Outcome: Progressing Goal: Ability to remain free from injury will improve Outcome: Progressing   Problem: Self-Care: Goal: Ability to participate in self-care as condition permits will improve Outcome: Progressing   Problem: Self-Concept: Goal: Will verbalize positive feelings about self Outcome: Progressing   Problem: Education: Goal: Ability to make informed decisions regarding treatment will improve Outcome: Progressing   Problem: Coping: Goal: Coping ability will improve Outcome: Progressing   Problem: Health Behavior/Discharge Planning: Goal: Identification of resources available to assist in meeting health care needs will improve Outcome: Progressing   Problem: Medication: Goal: Compliance with prescribed medication regimen will improve Outcome: Progressing   Problem: Self-Concept: Goal: Ability to disclose and discuss suicidal ideas will improve Outcome: Progressing Goal: Will verbalize positive feelings about self Outcome: Progressing   Problem: Education: Goal: Utilization of techniques to improve thought processes will improve Outcome: Progressing Goal: Knowledge of the prescribed therapeutic regimen will improve Outcome: Progressing   Problem: Activity: Goal: Interest or engagement in leisure activities will improve Outcome: Progressing Goal: Imbalance in normal sleep/wake cycle will improve Outcome: Progressing   Problem: Coping: Goal: Coping ability will improve Outcome: Progressing Goal: Will verbalize feelings Outcome: Progressing   Problem: Health Behavior/Discharge Planning: Goal: Ability to make decisions will improve Outcome: Progressing Goal: Compliance with therapeutic regimen  will improve Outcome: Progressing   Problem: Role Relationship: Goal: Will demonstrate positive changes in social behaviors and relationships Outcome: Progressing   Problem: Safety: Goal: Ability to disclose and discuss suicidal ideas will improve Outcome: Progressing Goal: Ability to identify and utilize support systems that promote safety will improve Outcome: Progressing   Problem: Self-Concept: Goal: Will verbalize positive feelings about self Outcome: Progressing Goal: Level of anxiety will decrease Outcome: Progressing

## 2018-06-28 MED ORDER — OLANZAPINE 10 MG PO TABS: 10.0000 mg | ORAL_TABLET | Freq: Two times a day (BID) | ORAL | 0 refills | Status: DC

## 2018-06-28 MED ORDER — LITHIUM CARBONATE ER 450 MG PO TBCR: 450.0000 mg | EXTENDED_RELEASE_TABLET | Freq: Two times a day (BID) | ORAL | 0 refills | Status: DC

## 2018-06-28 MED ORDER — TRAZODONE HCL 50 MG PO TABS
50.0000 mg | ORAL_TABLET | Freq: Every evening | ORAL | Status: DC | PRN
  Filled 2018-06-28: qty 7

## 2018-06-28 MED ORDER — FAMOTIDINE 20 MG PO TABS: 20.0000 mg | ORAL_TABLET | Freq: Two times a day (BID) | ORAL | 0 refills | Status: DC

## 2018-06-28 MED ORDER — TRAZODONE HCL 50 MG PO TABS: 50.0000 mg | ORAL_TABLET | Freq: Every evening | ORAL | 0 refills | Status: DC | PRN

## 2018-06-28 MED ORDER — HYDROXYZINE HCL 50 MG PO TABS: 50.0000 mg | ORAL_TABLET | Freq: Four times a day (QID) | ORAL | 0 refills | Status: DC | PRN

## 2018-06-28 NOTE — Progress Notes (Signed)
Patient ID: [name] [name], female   DOB: 01/21/1976, 42 y.o.   MRN: [id number] PER STATE REGULATIONS 482.30  THIS CHART WAS REVIEWED FOR MEDICAL NECESSITY WITH RESPECT TO THE PATIENT'S ADMISSION/DURATION OF STAY.  NEXT REVIEW DATE:07/02/18  Loura HaltBARBARA Juli Odom, RN, BSN CASE MANAGER

## 2018-06-28 NOTE — Progress Notes (Signed)
Patient discharged to lobby. Patient was stable and appreciative at that time. All papers, samples and prescriptions were given and valuables returned. Verbal understanding expressed. Denies SI/HI and A/VH. Patient given opportunity to express concerns and ask questions.  

## 2018-06-28 NOTE — Progress Notes (Signed)
Nursing note 7p-7a  Pt observed interacting with peers on unit this shift. Displayed a flat affect and irritable mood upon interaction with this Clinical research associatewriter. Pt denies pain ,denies SI/HI, and also denies any audio or visual hallucinations at this time.  Pt is able to verbally contract for safety with this RN. Goal: " go to a four year college" pt inquired on how to get into college and the steps and requested name of different colleges in [place] for her to request information from post discharge. Pt refused to take HS dose of lithium and would not disclose why. Pt complained of constipation and pain 7/10 as well as insomnia. See mar for prn medication administration.  Pt is now resting in bed with eyes closed, with no signs or symptoms of pain or distress noted. Pt continues to remain safe on the unit and is observed by rounding every 15 min. RN will continue to monitor.

## 2018-06-28 NOTE — BHH Counselor (Signed)
Adult Comprehensive Assessment  Patient ID: [name] [name], female   DOB: 1976/07/29, 42 y.o.   MRN: [id number]  Information Source: Information source: Patient  Current Stressors:  Patient states their primary concerns and needs for treatment are:: "Can you help me with these things?" Patient states their goals for this hospitilization and ongoing recovery are:: "I want to get  into college and find a place to live." Employment / Job issues: Disability Family Relationships: Left father, sister and nephew in [place]-states she has family here, but does not know where they are Surveyor, quantity / Lack of resources (include bankruptcy): Fixed income Housing / Lack of housing: [employer] Substance abuse: denies  Living/Environment/Situation:  Living Arrangements: Other (Comment)(hotel currently) Living conditions (as described by patient or guardian): OK Who else lives in the home?: other residents How long has patient lived in current situation?: couple of weeks What is atmosphere in current home: Temporary  Family History:  Are you sexually active?: No What is your sexual orientation?: straight Does patient have children?: Yes How many children?: 1 How is patient's relationship with their children?: 32 YO daughter living with aunt in [place] daughter's father is in [place]  Childhood History:  By whom was/is the patient raised?: Both parents Description of patient's relationship with caregiver when they were a child: good Patient's description of current relationship with people who raised him/her: OK with father, mother died 17 years ago Does patient have siblings?: Yes Number of Siblings: 3 Description of patient's current relationship with siblings: 1 sister deceased, i sister in [place], one in [place] Did patient suffer any verbal/emotional/physical/sexual abuse as a child?: No Did patient suffer from severe childhood neglect?: No Has patient ever been sexually abused/assaulted/raped as  an adolescent or adult?: No Was the patient ever a victim of a crime or a disaster?: No Witnessed domestic violence?: No Has patient been effected by domestic violence as an adult?: No  Education:  Highest grade of school patient has completed: bachelor's from [employer] Currently a student?: No(States her goal is to enroll at [employer]) Learning disability?: No  Employment/Work Situation:   Employment situation: On disability Why is patient on disability: mental health How long has patient been on disability: "Years" Patient's job has been impacted by current illness: No Did You Receive Any Psychiatric Treatment/Services While in the [employer]?: No Are There Guns or Other Weapons in Your Home?: No  Financial Resources:   Surveyor, quantity resources: Writer, Medicaid Does patient have a Lawyer or guardian?: No  Alcohol/Substance Abuse:   Alcohol/Substance Abuse Treatment Hx: Denies past history Has alcohol/substance abuse ever caused legal problems?: No  Social Support System:   Conservation officer, nature Support System: Fair Development worker, community Support System: Family Type of faith/religion: N/A How does patient's faith help to cope with current illness?: N/A  Leisure/Recreation:   Leisure and Hobbies: "Learning new things"  Strengths/Needs:   What is the patient's perception of their strengths?: Unsure Patient states they can use these personal strengths during their treatment to contribute to their recovery: None Patient states these barriers may affect/interfere with their treatment: None Patient states these barriers may affect their return to the community: None Other important information patient would like considered in planning for their treatment: none  Discharge Plan:   Currently receiving community mental health services: No Patient states concerns and preferences for aftercare planning are: Finding housing, enrolling in graduate school at [employer] Patient states  they will know when they are safe and ready for discharge when:  The above are in place Does patient have access to transportation?: Yes Does patient have financial barriers related to discharge medications?: No Will patient be returning to same living situation after discharge?: Yes  Summary/Recommendations:   Summary and Recommendations (to be completed by the evaluator): [name] [name] is a 42 YO AA female diagnosed with Schizoaffective D/O.  She presents IVC'd with disorganization and SI.  [name] [name] from [place], [place] and has no supports here.  [facility]  At D/C, she will return to the [employer] and [facility].  While here, [name] [name] can benefit from crises stabilization, medication management, therapeutic milieu and referral for services.  Ida Rogueodney B Terence Googe. 06/28/2018

## 2018-06-28 NOTE — BHH Suicide Risk Assessment (Signed)
[facility]   Principal Problem: Bipolar I disorder, current or most recent episode manic, with psychotic features [facility](HCC) Discharge Diagnoses:  Patient Active Problem List   Diagnosis Date Noted  . Bipolar affective disorder, manic, severe, with psychotic behavior (HCC) [F31.2] 06/24/2018  . Bipolar I disorder, current or most recent episode manic, with psychotic features (HCC) [F31.2] 06/24/2018    Total Time spent with patient: 30 minutes  Musculoskeletal: Strength & Muscle Tone: within normal limits Gait & Station: normal Patient leans: N/A  Psychiatric Specialty Exam: ROS  Blood pressure 108/76, pulse 77, temperature 98.6 F (37 C), temperature source Oral, resp. rate 16, height 4\' 10"  (1.473 m), weight 47.2 kg.Body mass index is 21.74 kg/m.  General Appearance: Casual and Fairly Groomed  Patent attorneyye Contact::  Good  Speech:  Clear and Coherent and Normal Rate  Volume:  Normal  Mood:  Anxious  Affect:  Appropriate, Blunt, Congruent and Constricted  Thought Process:  Coherent and Goal Directed  Orientation:  Full (Time, Place, and Person)  Thought Content:  Logical  Suicidal Thoughts:  No  Homicidal Thoughts:  No  Memory:  Immediate;   Fair Recent;   Fair Remote;   Fair  Judgement:  Poor  Insight:  Lacking  Psychomotor Activity:  Normal  Concentration:  Fair  Recall:  Fis[employer]Fair  Fund of Knowledge:Fair  Language: Fair  Akathisia:  No  Handed:    AIMS (if indicated):     Assets:  Resilience Social Support  Sleep:  Number of Hours: 6.25  Cognition: WNL  ADL's:  Intact   Mental Status Per Nursing Assessment::   On Admission:  NA  Demographic Factors:  Low socioeconomic status, Living alone and Unemployed  Loss Factors: Decrease in vocational status and Financial problems/change in socioeconomic status  Historical Factors: Family history of mental illness or substance abuse and Impulsivity  Risk Reduction Factors:   Positive social support,  Positive therapeutic relationship and Positive coping skills or problem solving skills  Continued Clinical Symptoms:  Bipolar Disorder:   Mixed State  Cognitive Features That Contribute To Risk:  None    Suicide Risk:  Minimal: No identifiable suicidal ideation.  Patients presenting with no risk factors but with morbid ruminations; may be classified as minimal risk based on the severity of the depressive symptoms  Follow-up Information    [facility] Follow up on 07/02/2018.   Why:  Tuesday at 8AM for your hospital follow up appointment. Also, [facility].  Contact [facility] at [phone] for help with transportation to appointments,etc. Contact information: [address] [place] [address] [phone]         Subjective Data:  [name] DittyBillie [name] is a 42 y/o F with history of Bipolar disorder who was admitted on IVC from [facility] where she presented brought in by EMS and [employer] after she was observed wandering in parking lot with disorganized, manic behavior. Pt was religiously preoccupied and making statements that God was trying to kill her and she was trying to kill God. Pt was agitated and aggressive with EMS initially, and she required versed and haldol to manage her behaviors. Pt was medically cleared in the ED and [facility] and [facility].Pt was continued on zyprexa and she was observed on the inpatient unit. Pt was also started on trial of lithium, but she later declined to have any medication aside from zyprexa. She had incremental improvement of her presenting symptoms.  Today upon evaluation, pt shares, "I'm doing good - I'm ready to go. I talked to the TCT, and they said they would help me get an apartment." She reports that overall her mood is doing well. She denies any specific concerns today. She is sleeping adequately. Her appetite is good. She denies other physical complaints. She denies SI/HI/AH/VH. She denies  feeling that God is taking over her body. She has not been having episodes of mood lability/agitation. She requests for discharge, and she is in agreement to have follow up at [facility]. She was able to engage in safety planning including plan to return to [facility] or contact emergency services if she feels unable to maintain her own safety or the safety of others. Pt had no further questions, comments, or concerns.   Plan Of Care/Follow-up recommendations:   -Discharge to outpatient level of care  -Bipolar I, current episode manic with psychotic features -Continue zyprexa 10mg  po BID             -Discontinue lithium CR 450mg  po BID             -anxiety -Continue vistaril 50mg  po q6h prn anxiety  -insomnia -Continue trazodone 50mg  po qhs prn insomnia  Activity:  as tolerated Diet:  normal Tests:  NA Other:  see above for DC plan  Micheal Likens, MD 06/28/2018, 10:35 AM

## 2018-06-28 NOTE — Discharge Summary (Addendum)
Physician Discharge Summary Note  Patient:  [name] [name] is an 42 y.o., female  MRN:  [id number]  DOB:  07/22/1976  Patient phone:  [phone] (home)   Patient address:   [address] [place] [id number],   Total Time spent with patient: Greater than 30 minutes  Date of Admission:  06/24/2018  Date of Discharge: 06-28-18  Reason for Admission: Patient was observed wandering in the parking lot with disorganized, manic behavior.  Principal Problem: Bipolar I disorder, current or most recent episode manic, with psychotic features [facility](HCC)  Discharge Diagnoses: Patient Active Problem List   Diagnosis Date Noted  . Bipolar affective disorder, manic, severe, with psychotic behavior (HCC) [F31.2] 06/24/2018  . Bipolar I disorder, current or most recent episode manic, with psychotic features (HCC) [F31.2] 06/24/2018   Past Psychiatric History: Bipolar disorder.  Past Medical History: History reviewed. No pertinent past medical history. History reviewed. No pertinent surgical history.  Family History: History reviewed. No pertinent family history.  Family Psychiatric  History: See H&P  Social History:  Social History   Substance and Sexual Activity  Alcohol Use Not Currently     Social History   Substance and Sexual Activity  Drug Use Not Currently    Social History   Socioeconomic History  . Marital status: Unknown    Spouse name: Not on file  . Number of children: Not on file  . Years of education: Not on file  . Highest education level: Not on file  Occupational History  . Not on file  Social Needs  . Financial resource strain: Not on file  . Food insecurity:    Worry: Not on file    Inability: Not on file  . Transportation needs:    Medical: Not on file    Non-medical: Not on file  Tobacco Use  . Smoking status: Never Smoker  . Smokeless tobacco: Never Used  Substance and Sexual Activity  . Alcohol use: Not Currently  . Drug use: Not Currently  .  Sexual activity: Not Currently  Lifestyle  . Physical activity:    Days per week: Not on file    Minutes per session: Not on file  . Stress: Not on file  Relationships  . Social connections:    Talks on phone: Not on file    Gets together: Not on file    Attends religious service: Not on file    Active member of club or organization: Not on file    Attends meetings of clubs or organizations: Not on file    Relationship status: Not on file  Other Topics Concern  . Not on file  Social History Narrative  . Not on file   Hospital Course: (See Md's discharge SRA): [name] DittyBillie [name] is a 42 y/o F with history of Bipolar disorder who was admitted [facility] IVC from [facility] where she presented brought in by EMS and [employer] after she was observed wandering in parking lot with disorganized, manic behavior. Pt was religiously preoccupied and making statements that God was trying to kill her and she was trying to kill God. Pt was agitated and aggressive with EMS initially, and she required versed and haldol to manage her behaviors. Pt was medically cleared in the ED and then [facility] and [facility].Pt was continued on zyprexa and she was observed on the inpatient unit. Pt was also started on trial of lithium, but she later declined to have any medication aside from zyprexa. She had incremental  improvement of her presenting symptoms.  Today upon evaluation, pt shares, "I'm doing good - I'm ready to go. I talked to the TCT, and they said they would help me get an apartment." She reports that overall her mood is doing well. She denies any specific concerns today. She is sleeping adequately. Her appetite is good. She denies other physical complaints. She denies SI/HI/AH/VH.She denies feeling that God is taking over her body. She has not been having episodes of mood lability/agitation. She requests for discharge, and she is in agreement to have follow up at [facility]. She was able to engage  in safety planning including plan to return to [facility] or contact emergency services if she feels unable to maintain her own safety or the safety of others. Pt had no further questions, comments, or concerns.  Plan Of Care/Follow-up recommendations:   -Discharge to outpatient level of care  -Bipolar I, current episode manic with psychotic features -Continue zyprexa 10mg  po BID -Discontinue lithium CR 450mg  po BID  -anxiety -Continue vistaril 50mg  po q6h prn anxiety  -insomnia -Continue trazodone 50mg  po qhs prn insomnia  Activity:  as tolerated Diet:  normal Tests:  NA Other:  see above for DC plan   Physical Findings: AIMS: Facial and Oral Movements Muscles of Facial Expression: None, normal Lips and Perioral Area: None, normal Jaw: None, normal Tongue: None, normal,Extremity Movements Upper (arms, wrists, hands, fingers): None, normal Lower (legs, knees, ankles, toes): None, normal, Trunk Movements Neck, shoulders, hips: None, normal, Overall Severity Severity of abnormal movements (highest score from questions above): None, normal Incapacitation due to abnormal movements: None, normal Patient's awareness of abnormal movements (rate only patient's report): No Awareness, Dental Status Current problems with teeth and/or dentures?: No Does patient usually wear dentures?: No  CIWA:    COWS:     Musculoskeletal: Strength & Muscle Tone: within normal limits Gait & Station: normal Patient leans: N/A  Psychiatric Specialty Exam: Physical Exam  Constitutional: She appears well-developed.  HENT:  Head: Normocephalic.  Eyes: Pupils are equal, round, and reactive to light.  Neck: Normal range of motion.  Cardiovascular: Normal rate.  Respiratory: Effort normal.  GI: Soft.  Genitourinary:  Genitourinary Comments: Deferred  Musculoskeletal: Normal range of motion.  Neurological: She is alert.  Skin: Skin is  warm.    Review of Systems  Constitutional: Negative.   HENT: Negative.   Eyes: Negative.   Respiratory: Negative.   Cardiovascular: Negative.   Gastrointestinal: Negative.   Genitourinary: Negative.   Musculoskeletal: Negative.   Skin: Negative.   Neurological: Negative.   Endo/Heme/Allergies: Negative.   Psychiatric/Behavioral: Positive for depression (Stable), hallucinations (Hx. Psychosis) and substance abuse (Hx. Benzodiazepine use ). Negative for memory loss and suicidal ideas. The patient has insomnia (Stable). The patient is not nervous/anxious.     Blood pressure 108/76, pulse 77, temperature 98.6 F (37 C), temperature source Oral, resp. rate 16, height 4\' 10"  (1.473 m), weight 47.2 kg.Body mass index is 21.74 kg/m.  See Md's SRA   Have you used any form of tobacco in the last 30 days? (Cigarettes, Smokeless Tobacco, Cigars, and/or Pipes): No  Has this patient used any form of tobacco in the last 30 days? (Cigarettes, Smokeless Tobacco, Cigars, and/or Pipes): N/A  Blood Alcohol level:  Lab Results  Component Value Date   ETH <10 06/23/2018   Metabolic Disorder Labs:  Lab Results  Component Value Date   HGBA1C 4.7 (L) 06/26/2018   MPG 88.19 06/26/2018   Lab Results  Component Value Date   PROLACTIN 28.9 (H) 06/26/2018   Lab Results  Component Value Date   CHOL 121 06/26/2018   TRIG 128 06/26/2018   HDL 38 (L) 06/26/2018   CHOLHDL 3.2 06/26/2018   VLDL 26 06/26/2018   LDLCALC 57 06/26/2018   See Psychiatric Specialty Exam and Suicide Risk Assessment completed by Attending Physician prior to discharge.  Discharge destination:  Home  Is patient on multiple antipsychotic therapies at discharge:  No   Has Patient had three or more failed trials of antipsychotic monotherapy by history:  No  Recommended Plan for Multiple Antipsychotic Therapies: NA  Allergies as of 06/28/2018   No Known Allergies     Medication List    STOP taking these medications    LORazepam 2 MG tablet Commonly known as:  ATIVAN     TAKE these medications     Indication  famotidine 20 MG tablet Commonly known as:  PEPCID Take 1 tablet (20 mg total) by mouth 2 (two) times daily. For acid reflux What changed:  additional instructions  Indication:  Gastroesophageal Reflux Disease   hydrOXYzine 50 MG tablet Commonly known as:  ATARAX/VISTARIL Take 1 tablet (50 mg total) by mouth every 6 (six) hours as needed for anxiety.  Indication:  Feeling Anxious   OLANZapine 10 MG tablet Commonly known as:  ZYPREXA Take 1 tablet (10 mg total) by mouth 2 (two) times daily. For mood control  Indication:  Mood control   traZODone 50 MG tablet Commonly known as:  DESYREL Take 1 tablet (50 mg total) by mouth at bedtime as needed for sleep.  Indication:  Trouble Sleeping      Follow-up Information    [facility] Follow up on 07/02/2018.   Why:  Tuesday at 8AM for your hospital follow up appointment. Also, you sindned up for services with [facility].  Contact [facility] at [phone] for help with transportation to appointments,etc. Contact information: [address] [place] [phone]          Follow-up recommendations: Activity:  As tolerated Diet: As recommended by your primary care doctor. Keep all scheduled follow-up appointments as recommended.   Comments: Patient is instructed prior to discharge to: Take all medications as prescribed by his/her mental healthcare provider. Report any adverse effects and or reactions from the medicines to his/her outpatient provider promptly. Patient has been instructed & cautioned: To not engage in alcohol and or illegal drug use while on prescription medicines. In the event of worsening symptoms, patient is instructed to call the crisis hotline, 911 and or go to the nearest ED for appropriate evaluation and treatment of symptoms. To follow-up with his/her primary care provider for your other medical issues,  concerns and or health care needs.   Signed: Armandina Stammer, NP, PMHNP, FNP-BC 06/28/2018, 10:55 AM   Patient seen, Suicide Assessment Completed.  Disposition Plan Reviewed

## 2018-06-28 NOTE — Progress Notes (Signed)
  [facility] Discharge [facility] :  Will you be returning to the same living situation after discharge:  Yes,  [address] At discharge, do you have transportation home?: Yes,  [facility] Do you have the ability to pay for your medications: Yes,  mental health  Release of information consent forms completed and in the chart;  Patient's signature needed at discharge.  Patient to Follow up at: Follow-up Information    [facility] Follow up on 07/02/2018.   Why:  Tuesday at 8AM for your hospital follow up appointment. Also, you sind[facility].  Contact [employer] at [phone] for help with transportation to appointments,etc. Contact information: [address] [place] [address] [phone]           Next level of care provider has access to [facility] Link:no  Safety Planning and Suicide Prevention discussed: Yes,  yes  Have you used any form of tobacco in the last 30 days? (Cigarettes, Smokeless Tobacco, Cigars, and/or Pipes): No  Has patient been referred to the Quitline?: N/A patient is not a smoker  Patient has been referred for addiction treatment: N/A  Ida RogueRodney B Ovetta Bazzano, LCSW 06/28/2018, 9:37 AM

## 2018-07-02 DIAGNOSIS — F4321 Adjustment disorder with depressed mood: Secondary | ICD-10-CM | POA: Diagnosis not present

## 2018-07-19 DIAGNOSIS — F312 Bipolar disorder, current episode manic severe with psychotic features: Secondary | ICD-10-CM | POA: Diagnosis not present

## 2018-07-21 ENCOUNTER — Encounter (HOSPITAL_COMMUNITY): Payer: Self-pay | Admitting: Nurse Practitioner

## 2018-07-21 ENCOUNTER — Emergency Department (HOSPITAL_COMMUNITY)
Admission: EM | Admit: 2018-07-21 | Discharge: 2018-07-22 | Disposition: A | Discharge status: Home / Self Care | Payer: Medicare Other | Attending: Emergency Medicine | Admitting: Emergency Medicine

## 2018-07-21 DIAGNOSIS — Z046 Encounter for general psychiatric examination, requested by authority: Secondary | ICD-10-CM | POA: Diagnosis present

## 2018-07-21 DIAGNOSIS — F312 Bipolar disorder, current episode manic severe with psychotic features: Secondary | ICD-10-CM | POA: Insufficient documentation

## 2018-07-21 DIAGNOSIS — F918 Other conduct disorders: Secondary | ICD-10-CM | POA: Diagnosis not present

## 2018-07-21 DIAGNOSIS — Z590 Homelessness: Secondary | ICD-10-CM | POA: Insufficient documentation

## 2018-07-21 DIAGNOSIS — Z79899 Other long term (current) drug therapy: Secondary | ICD-10-CM | POA: Diagnosis not present

## 2018-07-21 DIAGNOSIS — R45851 Suicidal ideations: Secondary | ICD-10-CM | POA: Diagnosis not present

## 2018-07-21 DIAGNOSIS — W19XXXA Unspecified fall, initial encounter: Secondary | ICD-10-CM | POA: Diagnosis not present

## 2018-07-21 DIAGNOSIS — F22 Delusional disorders: Secondary | ICD-10-CM | POA: Insufficient documentation

## 2018-07-21 DIAGNOSIS — R443 Hallucinations, unspecified: Secondary | ICD-10-CM | POA: Diagnosis not present

## 2018-07-21 MED ORDER — OLANZAPINE 10 MG PO TBDP
10.0000 mg | ORAL_TABLET | Freq: Every day | ORAL | Status: DC
  Administered 2018-07-21: 10 mg via ORAL
  Filled 2018-07-21: qty 1

## 2018-07-21 NOTE — ED Notes (Signed)
Bed: WA07 Expected date:  Expected time:  Means of arrival:  Comments: Hallucinations

## 2018-07-21 NOTE — ED Triage Notes (Signed)
Pt states "Jesus is trying to kill her and this has been going on for a long time." She endorses psychiatric hx. Continues to exhibit flight of ideas ranging from her relationship with her daughter, her neighbor, needing transport to school, a jealous boyfriend and many more.

## 2018-07-21 NOTE — ED Provider Notes (Signed)
[facility]-EMERGENCY DEPT Provider Note   CSN: [id number] Arrival date & time: 07/21/18  2045     History   Chief Complaint Chief Complaint  Patient presents with  . Hallucinations  . Psychiatric Evaluation    HPI [name] [name] is a 42 y.o. female.  42 yo F with a chief complaint of hallucinations.  The patient has been having ongoing issue with her schizophrenia than Jesus Lorie ApleyChrist is trying to take over her life.  She states that he makes her do certain things and tries to harm the man in her life.  She is currently living at a shelter and is upset that she only has one television channel.  She wants to find a better location to live.  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  She has tried multiple times to be seen at [facility] but thinks that they are blowing her off.  The history is provided by the patient.  Illness  This is a new problem. The current episode started yesterday. The problem occurs constantly. The problem has not changed since onset.Pertinent negatives include no chest pain, no headaches and no shortness of breath. Nothing aggravates the symptoms. Nothing relieves the symptoms. She has tried nothing for the symptoms. The treatment provided no relief.    History reviewed. No pertinent past medical history.  Patient Active Problem List   Diagnosis Date Noted  . Bipolar affective disorder, manic, severe, with psychotic behavior (HCC) 06/24/2018  . Bipolar I disorder, current or most recent episode manic, with psychotic features (HCC) 06/24/2018    History reviewed. No pertinent surgical history.   OB History   None      Home Medications    Prior to Admission medications   Medication Sig Start Date End Date Taking? Authorizing Provider  ATIVAN 2 MG tablet Take 2 mg by mouth 2 (two) times daily as needed for anxiety or sleep.  06/16/18  Yes [provider]  OLANZapine (ZYPREXA) 10 MG tablet Take 1  tablet (10 mg total) by mouth 2 (two) times daily. For mood control 06/28/18  Yes Armandina StammerNwoko, Agnes I, NP  traZODone (DESYREL) 50 MG tablet Take 1 tablet (50 mg total) by mouth at bedtime as needed for sleep. 06/28/18  Yes Armandina StammerNwoko, Agnes I, NP  famotidine (PEPCID) 20 MG tablet Take 1 tablet (20 mg total) by mouth 2 (two) times daily. For acid reflux Patient not taking: Reported on 07/21/2018 06/28/18   Armandina StammerNwoko, Agnes I, NP  hydrOXYzine (ATARAX/VISTARIL) 50 MG tablet Take 1 tablet (50 mg total) by mouth every 6 (six) hours as needed for anxiety. Patient not taking: Reported on 07/21/2018 06/28/18   Armandina StammerNwoko, Agnes I, NP    Family History No family history on file.  Social History Social History   Tobacco Use  . Smoking status: Never Smoker  . Smokeless tobacco: Never Used  Substance Use Topics  . Alcohol use: Not Currently  . Drug use: Not Currently     Allergies   Patient has no known allergies.   Review of Systems Review of Systems  Constitutional: Negative for chills and fever.  HENT: Negative for congestion and rhinorrhea.   Eyes: Negative for redness and visual disturbance.  Respiratory: Negative for shortness of breath and wheezing.   Cardiovascular: Negative for chest pain and palpitations.  Gastrointestinal: Negative for nausea and vomiting.  Genitourinary: Negative for dysuria and urgency.  Musculoskeletal: Negative for arthralgias and myalgias.  Skin:  Negative for pallor and wound.  Neurological: Negative for dizziness and headaches.  Psychiatric/Behavioral: Positive for hallucinations.     Physical Exam Updated Vital Signs BP 115/78 (BP Location: Left Arm)   Pulse 74   Temp 98 F (36.7 C) (Oral)   Resp 14   SpO2 100%   Physical Exam  Constitutional: She is oriented to person, place, and time. She appears well-developed and well-nourished. No distress.  HENT:  Head: Normocephalic and atraumatic.  Eyes: Pupils are equal, round, and reactive to light. EOM are normal.    Neck: Normal range of motion. Neck supple.  Cardiovascular: Normal rate and regular rhythm. Exam reveals no gallop and no friction rub.  No murmur heard. Pulmonary/Chest: Effort normal. She has no wheezes. She has no rales.  Abdominal: Soft. She exhibits no distension. There is no tenderness.  Musculoskeletal: She exhibits no edema or tenderness.  Neurological: She is alert and oriented to person, place, and time.  Skin: Skin is warm and dry. She is not diaphoretic.  Psychiatric: Her behavior is normal. Her mood appears anxious. Her speech is rapid and/or pressured and tangential. Thought content is delusional.  Nursing note and vitals reviewed.    ED Treatments / Results  Labs (all labs ordered are listed, but only abnormal results are displayed) Labs Reviewed - No data to display  EKG None  Radiology No results found.  Procedures Procedures (including critical care time)  Medications Ordered in ED Medications  OLANZapine zydis (ZYPREXA) disintegrating tablet 10 mg (10 mg Oral Given 07/21/18 2244)     Initial Impression / Assessment and Plan / ED Course  I have reviewed the triage vital signs and the nursing notes.  Pertinent labs & imaging results that were available during my care of the patient were reviewed by me and considered in my medical decision making (see chart for details).     42 yo F here because she said that [name] is trying to harm her.  This is been an ongoing issue for her.  She states that it is gotten worse over the past couple days.  That she is dating someone seriously and he thinks that this new man is going to take his place.  She is here also for help finding transportation to her cosmetology school.  She has tried to go to [place] multiple times and thinks that keep blowing her off and requests a different place to go.  I feel she is medically clear.  TTS evaluation.  The patients results and plan were reviewed and discussed.   Any x-rays  performed were independently reviewed by myself.   Differential diagnosis were considered with the presenting HPI.  Medications  OLANZapine zydis (ZYPREXA) disintegrating tablet 10 mg (10 mg Oral Given 07/21/18 2244)    Vitals:   07/21/18 2120  BP: 115/78  Pulse: 74  Resp: 14  Temp: 98 F (36.7 C)  TempSrc: Oral  SpO2: 100%    Final diagnoses:  Delusion [facility])      Final Clinical Impressions(s) / ED Diagnoses   Final diagnoses:  Delusion [facility])    ED Discharge Orders    None       Melene Plan, DO 07/21/18 2352

## 2018-07-22 ENCOUNTER — Encounter: Payer: Self-pay | Admitting: Emergency Medicine

## 2018-07-22 ENCOUNTER — Emergency Department (HOSPITAL_COMMUNITY)
Admission: EM | Admit: 2018-07-22 | Discharge: 2018-07-22 | Disposition: A | Discharge status: Home / Self Care | Payer: Medicare Other | Source: Home / Self Care | Attending: Emergency Medicine | Admitting: Emergency Medicine

## 2018-07-22 DIAGNOSIS — F312 Bipolar disorder, current episode manic severe with psychotic features: Secondary | ICD-10-CM

## 2018-07-22 DIAGNOSIS — R45851 Suicidal ideations: Secondary | ICD-10-CM | POA: Diagnosis not present

## 2018-07-22 DIAGNOSIS — R4689 Other symptoms and signs involving appearance and behavior: Secondary | ICD-10-CM

## 2018-07-22 DIAGNOSIS — F918 Other conduct disorders: Secondary | ICD-10-CM | POA: Diagnosis not present

## 2018-07-22 DIAGNOSIS — F22 Delusional disorders: Secondary | ICD-10-CM | POA: Diagnosis not present

## 2018-07-22 DIAGNOSIS — Z79899 Other long term (current) drug therapy: Secondary | ICD-10-CM | POA: Insufficient documentation

## 2018-07-22 MED ORDER — BACITRACIN ZINC 500 UNIT/GM EX OINT
TOPICAL_OINTMENT | CUTANEOUS | Status: AC
  Administered 2018-07-22
  Filled 2018-07-22: qty 0.9

## 2018-07-22 NOTE — Discharge Instructions (Addendum)
Follow-up with your ACT team. 

## 2018-07-22 NOTE — ED Notes (Signed)
Notified TTS that pt is now able to participate in the assessment as she is awake and alert. Tele psych set up in the patients room complete.

## 2018-07-22 NOTE — Progress Notes (Signed)
CSW provided pt with a taxi voucher as it is unlikelly pt will be D/C'd in time to get to a bus station to transition to the correct bus in order to return to her tansiitional housing at:  [address] [place], [place] [address].  Of note, CSW is unsure if this is actually transitional housing or just an address provided to the CSW by the pt.  Google maps does show a yellow [name] [name] parked out front which might indicate some sort of "home".  RN was provided with voucher and will call the taxi at D/C.  EDP updated.  CSW will continue to follow for D/C needs.  Dorothe PeaJonathan F. Velencia Lenart, LCSW, LCAS, CSI Clinical Social Worker Ph: [phone]

## 2018-07-22 NOTE — ED Notes (Signed)
Called cab per voucher given to Pt by case manager.

## 2018-07-22 NOTE — ED Notes (Signed)
[employer] severed Pt IVC paperwork. Notified Dr. Lockie Molauratolo.

## 2018-07-22 NOTE — ED Provider Notes (Signed)
[facility]-EMERGENCY DEPT Provider Note   CSN: [id number] Arrival date & time: 07/22/18  2101     History   Chief Complaint Chief Complaint  Patient presents with  . Aggressive Behavior    HPI [name] [name] is a 42 y.o. female.  The history is provided by the patient.  Mental Health Problem  Presenting symptoms: aggressive behavior   Patient accompanied by:  Law enforcement Degree of incapacity (severity):  Moderate Onset quality:  Sudden Timing:  Constant Progression:  Unchanged Chronicity:  New Context: stressful life event   Context: not noncompliant   Treatment compliance:  All of the time Relieved by:  Nothing Worsened by:  Nothing Associated symptoms: no abdominal pain and no chest pain   Risk factors: hx of mental illness     History reviewed. No pertinent past medical history.  Patient Active Problem List   Diagnosis Date Noted  . Bipolar affective disorder, manic, severe, with psychotic behavior (HCC) 06/24/2018  . Bipolar I disorder, current or most recent episode manic, with psychotic features (HCC) 06/24/2018    History reviewed. No pertinent surgical history.   OB History   None      Home Medications    Prior to Admission medications   Medication Sig Start Date End Date Taking? Authorizing Provider  ATIVAN 2 MG tablet Take 2 mg by mouth 2 (two) times daily as needed for anxiety or sleep.  06/16/18   [provider]  famotidine (PEPCID) 20 MG tablet Take 1 tablet (20 mg total) by mouth 2 (two) times daily. For acid reflux Patient not taking: Reported on 07/21/2018 06/28/18   Armandina Stammer I, NP  hydrOXYzine (ATARAX/VISTARIL) 50 MG tablet Take 1 tablet (50 mg total) by mouth every 6 (six) hours as needed for anxiety. Patient not taking: Reported on 07/21/2018 06/28/18   Armandina Stammer I, NP  OLANZapine (ZYPREXA) 10 MG tablet Take 1 tablet (10 mg total) by mouth 2 (two) times daily. For mood control 06/28/18   Armandina Stammer I,  NP  traZODone (DESYREL) 50 MG tablet Take 1 tablet (50 mg total) by mouth at bedtime as needed for sleep. 06/28/18   Sanjuana Kava, NP    Family History History reviewed. No pertinent family history.  Social History Social History   Tobacco Use  . Smoking status: Never Smoker  . Smokeless tobacco: Never Used  Substance Use Topics  . Alcohol use: Not Currently  . Drug use: Not Currently     Allergies   Patient has no known allergies.   Review of Systems Review of Systems  Constitutional: Negative for chills and fever.  HENT: Negative for ear pain and sore throat.   Eyes: Negative for pain and visual disturbance.  Respiratory: Negative for cough and shortness of breath.   Cardiovascular: Negative for chest pain and palpitations.  Gastrointestinal: Negative for abdominal pain and vomiting.  Genitourinary: Negative for dysuria and hematuria.  Musculoskeletal: Negative for arthralgias and back pain.  Skin: Negative for color change and rash.  Neurological: Negative for seizures and syncope.  All other systems reviewed and are negative.    Physical Exam Updated Vital Signs  ED Triage Vitals  Enc Vitals Group     BP 07/22/18 2113 105/74     Pulse Rate 07/22/18 2113 88     Resp 07/22/18 2113 18     Temp 07/22/18 2113 98 F (36.7 C)     Temp Source 07/22/18 2113 Oral     SpO2  07/22/18 2113 95 %     Weight 07/22/18 2115 108 lb (49 kg)     Height 07/22/18 2115 4\' 10"  (1.473 m)     Head Circumference --      Peak Flow --      Pain Score 07/22/18 2114 7     Pain Loc --      Pain Edu? --      Excl. in GC? --     Physical Exam  Constitutional: She appears well-developed and well-nourished. No distress.  HENT:  Head: Normocephalic and atraumatic.  Eyes: Pupils are equal, round, and reactive to light. Conjunctivae and EOM are normal.  Neck: Normal range of motion. Neck supple.  Cardiovascular: Normal rate, regular rhythm, normal heart sounds and intact distal pulses.   No murmur heard. Pulmonary/Chest: Effort normal and breath sounds normal. No respiratory distress.  Abdominal: Soft. There is no tenderness.  Musculoskeletal: She exhibits no edema.  Neurological: She is alert.  Skin: Skin is warm and dry.  Psychiatric: She has a normal mood and affect. Her speech is normal and behavior is normal. Judgment and thought content normal. She is not actively hallucinating. Cognition and memory are normal. She expresses no homicidal ideation. She expresses no suicidal plans and no homicidal plans.  Nursing note and vitals reviewed.    ED Treatments / Results  Labs (all labs ordered are listed, but only abnormal results are displayed) Labs Reviewed - No data to display  EKG None  Radiology No results found.  Procedures Procedures (including critical care time)  Medications Ordered in ED Medications  bacitracin 500 UNIT/GM ointment (has no administration in time range)     Initial Impression / Assessment and Plan / ED Course  I have reviewed the triage vital signs and the nursing notes.  Pertinent labs & imaging results that were available during my care of the patient were reviewed by me and considered in my medical decision making (see chart for details).     [name] [name] is a 42 year old female who presents to the ED with aggressive behavior.  Patient has history of bipolar disorder.  Patient with normal vitals.  No fever.  She is here with police who filled out IVC as patient states that she was hearing voices from God.  According to police she endorsed suicidal ideation.  Patient was getting a ride home from a taxi driver and did not have any money to pay for the cab.  The police was called and they were driving her to her home when she started to get upset.  Upon arrival patient is agitated with police and states that they were aggressive with her.  I am able to redirect the patient and had a conversation with her.  She is stressed out about her  current living situation.  She is currently living in a transition home.  Upon my evaluation patient is not endorsing any suicidal or homicidal ideation.  She is not having any hallucinations.  She has normal vitals and has no physical symptoms.  Social work was consulted and they are able to arrange for ride home.  IVC was rescinded.  Patient understands return precautions.  Discharged from ED in good condition.  This chart was dictated using voice recognition software.  Despite best efforts to proofread,  errors can occur which can change the documentation meaning.   Final Clinical Impressions(s) / ED Diagnoses   Final diagnoses:  Aggressive behavior    ED Discharge Orders  None       Virgina Norfolk, DO 07/22/18 2350

## 2018-07-22 NOTE — BH Assessment (Signed)
Received TTS consult request. Spoke to Dairl Ponderscar Njihi, RN who said Pt in unable to participate in assessment at this time. [facility] staff will contact TTS when Pt is able to participate.   Harlin RainFord Ellis Patsy BaltimoreWarrick Jr, LPC, [facility], [facility] Triage Specialist [phone]

## 2018-07-22 NOTE — ED Triage Notes (Signed)
Pt arrived by [employer]. [employer] reports that they offered Pt ride home when Pt got into vehicle Pt stated "God was going to get her to hurt herself tonight". On arrival was combative and required security. Pt denies any SI or HI at this time.

## 2018-07-22 NOTE — ED Notes (Signed)
Dr. Lockie Molauratolo resended Pt's IVC paperwork

## 2018-07-22 NOTE — ED Notes (Signed)
Spoke with Pt assessment, states pt is ready for discharge to follow up with her dr and continue medication

## 2018-07-22 NOTE — ED Notes (Signed)
Jon with Social Work at bedside

## 2018-07-22 NOTE — ED Notes (Signed)
Patient yelling at this writer to "get the fuck out" of the room, primary RN at bedside at this time.

## 2018-07-22 NOTE — ED Notes (Signed)
Walked with Pt out of facility to her cab. Pt verbalized understanding. Handed cab driver Pt's voucher

## 2018-07-22 NOTE — Progress Notes (Signed)
Spoke with Nurse, Jeanice LimHolly, and informed of disposition. Per Donell SievertSpencer Simon, PA pt to be discharged and follow up with her ACTTeam 07/22/2018.

## 2018-07-22 NOTE — BH Assessment (Addendum)
Tele Assessment Note   Patient Name: [name] [name] MRN: [id number] Referring Physician: Default, Demetrius Charity  Location of Patient: [facility] Location of Provider: Behavioral Health TTS Department  [name] [name] is an 42 y.o., single female. Pt presented in ED at [facility] due to reportedly having hallucinations. Pt stated, "I was very disapointed with the housing they set me up with through [facility]. [name] is chasing me and has been for years. I don't have no transportation to get to my cosmetology classes. [facility] just dropped me off and they not helping me. [facility] is supposed transport me and they haven't. The food isn't good where I'm staying. I ya'll to help me get transportation for my classes and find me a better place to stay." Pt reported that she is seeking help with housing and transportation. Pt reports that she is a client of [facility] and has and ACTTeam currently. Pt reported that the ACTTeam is already seeking alternative housing for her, but stated that she doesn't want to stay at the current transitional housing placement. Pt stated, "I'll go back there, but can ya'll help me get a ride to my classes." Pt verbally stated that she understood that those services would need to be requested through the ACTTeam and that the hospital staff cannot help her with housing and transportation. Pt denied depression and anxiety symptoms, reports sleeping 8 hours and having a normal appetite. Pt denied VH, but reported that "[name] is following me. He makes me hit myself sometimes. He tells me to stick my hand in my pants, but I'm a virgin." Pt seems to be at baseline with psychotic symptoms and reports taking her medications as prescribed. Pt denies SA hx. Pt denies family hx of MH/SA issues.   Pt reports that she is homeless, but is in a transitional housing placement in [place] currently. Pt stated that she can return. Pt denies ever being married, but stated that she has a child that lives with an  aunt. Pt reports that she receives SSI and is her own payee.  Pt reports having Medicaid. Pt reports that she is her own guardian as well.   Pt presented calm, communicative, speaking clearly and groomed. Pt does not seem to be under the influence of any substances. Pt was oriented to place, time and situation. Pt reported no hx of abuse. Pt did not present with issues of memory. Pt presented with good judgement, fair impulse control and fair insight. Pt seemed very aware of her symptoms and was able to articulate them very well.   Per Donell Sievert, PA pt to be discharged and follow up with her ACTTeam 07/22/2018.   Diagnosis: Bipolar I disorder, Current or most recent episode manic.   Past Medical History: History reviewed. No pertinent past medical history.  History reviewed. No pertinent surgical history.  Family History: No family history on file.  Social History:  reports that she has never smoked. She has never used smokeless tobacco. She reports that she drank alcohol. She reports that she has current or past drug history.  Additional Social History:  Alcohol / Drug Use Pain Medications: See MAR Prescriptions: Xyprexa; Ativan; Trazodone  Over the Counter: See MAR History of alcohol / drug use?: No history of alcohol / drug abuse  CIWA: CIWA-Ar BP: 115/78 Pulse Rate: 74 COWS:    Allergies: No Known Allergies  Home Medications:  (Not in a hospital admission)  OB/GYN Status:  No LMP recorded.  General Assessment Data Assessment unable  to be completed: Yes Reason for not completing assessment: Pt medicated and unable to participate [facility] of [facility]: [facility] TTS Assessment: In system Is this a Tele or Face-to-Face Assessment?: Tele Assessment Is this an Initial Assessment or a Re-assessment for this encounter?: Initial Assessment Patient Accompanied by:: N/A Language Other than English: No Living Arrangements: Homeless/Shelter(Transitional Housing) What gender do you  identify as?: Female Marital status: Single Maiden name: N/A Pregnancy Status: No Living Arrangements: Other (Comment)(Transitional Housing) Can pt return to current living arrangement?: Yes Admission Status: Voluntary Is patient capable of signing voluntary admission?: Yes Referral Source: Self/Family/Friend Insurance type: Medicaid  Medical Screening Exam [facility] Walk-in ONLY) Medical Exam completed: Yes  Crisis Care Plan Living Arrangements: Other (Comment)(Transitional Housing) Legal Guardian: (N/A) Name of Psychiatrist: [facility] Name of Therapist: [facility]  Education Status Is patient currently in school?: Yes(Reports being enrolled in cosmetology courses. ) Current Grade: College Highest grade of school patient has completed: Unsure  Name of school: [employer] person: N/A IEP information if applicable: N/A Is the patient employed, unemployed or receiving disability?: Receiving disability income  Risk to self with the past 6 months Suicidal Ideation: No Has patient been a risk to self within the past 6 months prior to admission? : No Suicidal Intent: No Has patient had any suicidal intent within the past 6 months prior to admission? : No Is patient at risk for suicide?: No Suicidal Plan?: No Has patient had any suicidal plan within the past 6 months prior to admission? : No Access to Means: No What has been your use of drugs/alcohol within the last 12 months?: Denies Previous Attempts/Gestures: No How many times?: 0 Other Self Harm Risks: Hitting self when commanded by "[name]."  Triggers for Past Attempts: Unknown Intentional Self Injurious Behavior: Bruising Comment - Self Injurious Behavior: Hitting self when commanded.  Family Suicide History: No Recent stressful life event(s): Loss (Comment)(Homelessness) Persecutory voices/beliefs?: Yes("[name] follows me." ) Depression: No Depression Symptoms: (Denies) Substance abuse history  and/or treatment for substance abuse?: No Suicide prevention information given to non-admitted patients: Not applicable  Risk to Others within the past 6 months Homicidal Ideation: No Does patient have any lifetime risk of violence toward others beyond the six months prior to admission? : No Thoughts of Harm to Others: No Current Homicidal Intent: No Current Homicidal Plan: No Access to Homicidal Means: No Identified Victim: Denies History of harm to others?: No Assessment of Violence: On admission Violent Behavior Description: Denies Does patient have access to weapons?: No Criminal Charges Pending?: No Does patient have a court date: No Is patient on probation?: No  Psychosis Hallucinations: Auditory("[name]" reportedly speaks to her and gives commands.) Delusions: Unspecified  Mental Status Report Appearance/Hygiene: Unremarkable, In scrubs Eye Contact: Good Motor Activity: Unremarkable Speech: Tangential Level of Consciousness: Alert Mood: Pleasant Affect: Flat Anxiety Level: None Thought Processes: Circumstantial Judgement: Unimpaired Orientation: Person, Place, Situation Obsessive Compulsive Thoughts/Behaviors: None  Cognitive Functioning Concentration: Normal Memory: Recent Intact, Remote Intact Is patient IDD: No Insight: Poor Impulse Control: Poor Appetite: Good Have you had any weight changes? : No Change Sleep: No Change Total Hours of Sleep: 8 Vegetative Symptoms: None  ADLScreening [facility] Assessment Services) Patient's cognitive ability adequate to safely complete daily activities?: Yes Patient able to express need for assistance with ADLs?: Yes Independently performs ADLs?: Yes (appropriate for developmental age)  Prior Inpatient Therapy Prior Inpatient Therapy: Yes Prior Therapy Dates: Pt unable to remember.  Prior Therapy Facilty/Provider(s): [facility] Reason  for Treatment: Bipolar D/O  Prior Outpatient Therapy Prior Outpatient Therapy:  Yes Prior Therapy Dates: Pt unable to remember.  [facility] Reason for Treatment: Bipolar Does patient have an ACCT team?: Yes Does patient have Intensive In-House Services?  : No Does patient have [facility] services? : Yes Does patient have P4CC services?: No  ADL Screening (condition at time of admission) Patient's cognitive ability adequate to safely complete daily activities?: Yes Is the patient deaf or have difficulty hearing?: No Does the patient have difficulty seeing, even when wearing glasses/contacts?: No Does the patient have difficulty concentrating, remembering, or making decisions?: No Patient able to express need for assistance with ADLs?: Yes Does the patient have difficulty dressing or bathing?: No Independently performs ADLs?: Yes (appropriate for developmental age) Does the patient have difficulty walking or climbing stairs?: No Weakness of Legs: None Weakness of Arms/Hands: None     Therapy Consults (therapy consults require a physician order) PT Evaluation Needed: No OT Evalulation Needed: No SLP Evaluation Needed: No Abuse/Neglect Assessment (Assessment to be complete while patient is alone) Abuse/Neglect Assessment Can Be Completed: Yes Physical Abuse: Denies Verbal Abuse: Denies Sexual Abuse: Denies Exploitation of patient/patient's resources: Denies Self-Neglect: Denies Values / Beliefs Cultural Requests During Hospitalization: None Spiritual Requests During Hospitalization: None Consults Spiritual Care Consult Needed: No Social Work Consult Needed: No Merchant navy officerAdvance Directives (For Healthcare) Does Patient Have a Medical Advance Directive?: No Does patient want to make changes to medical advance directive?: No - Patient declined Would patient like information on creating a medical advance directive?: No - Patient declined          Disposition: Per Donell SievertSpencer Simon, PA pt to be discharged and follow up with her ACTTeam  07/22/2018.  Disposition Initial Assessment Completed for this Encounter: Yes  This service was provided via telemedicine using a 2-way, interactive audio and video technology.  Names of all persons participating in this telemedicine service and their role in this encounter. Name: Marcelyn DittyBillie [name] Role: Patient  Name: Chesley NoonMiriam Ameya Vowell, M.S., LCAS, [facility] Role: Assessor  Name:  Role:   Name:  Role:     Jeffree Cazeau Fatima Sanger Irbin Fines, M.S., [facility], LCAS Triage Specialist 07/22/2018 2:44 AM

## 2018-07-24 ENCOUNTER — Emergency Department (HOSPITAL_COMMUNITY)
Admission: EM | Admit: 2018-07-24 | Discharge: 2018-07-24 | Disposition: A | Discharge status: Home / Self Care | Payer: Medicare Other | Attending: Emergency Medicine | Admitting: Emergency Medicine

## 2018-07-24 ENCOUNTER — Encounter (HOSPITAL_COMMUNITY): Payer: Self-pay

## 2018-07-24 ENCOUNTER — Other Ambulatory Visit: Payer: Self-pay

## 2018-07-24 DIAGNOSIS — Z1339 Encounter for screening examination for other mental health and behavioral disorders: Secondary | ICD-10-CM | POA: Insufficient documentation

## 2018-07-24 DIAGNOSIS — Z5321 Procedure and treatment not carried out due to patient leaving prior to being seen by health care provider: Secondary | ICD-10-CM | POA: Diagnosis not present

## 2018-07-24 DIAGNOSIS — R456 Violent behavior: Secondary | ICD-10-CM | POA: Diagnosis not present

## 2018-07-24 DIAGNOSIS — R5381 Other malaise: Secondary | ICD-10-CM | POA: Diagnosis not present

## 2018-07-24 DIAGNOSIS — F29 Unspecified psychosis not due to a substance or known physiological condition: Secondary | ICD-10-CM | POA: Diagnosis not present

## 2018-07-24 NOTE — ED Notes (Addendum)
While going through triage and talking with EMS, pt abruptly walked to the restroom and provided UA. Pt then stated that she was "leaving".  Pt not IVC'd, steady with ambulation, denies SI/HI.

## 2018-07-24 NOTE — ED Triage Notes (Addendum)
Pt BIBA from home. Pt reluctant to talk to EMS and this RN. Pt will not answer questions. Pt tearful.

## 2018-07-24 NOTE — ED Notes (Signed)
Pt ambulatory to restroom and took UA cup when going to restroom.

## 2018-07-26 ENCOUNTER — Encounter (HOSPITAL_COMMUNITY): Payer: Self-pay | Admitting: Emergency Medicine

## 2018-07-26 ENCOUNTER — Ambulatory Visit (HOSPITAL_COMMUNITY)
Admission: RE | Admit: 2018-07-26 | Discharge: 2018-07-26 | Disposition: A | Discharge status: Home / Self Care | Payer: Medicare Other | Source: Home / Self Care | Attending: Psychiatry | Admitting: Psychiatry

## 2018-07-26 ENCOUNTER — Emergency Department (HOSPITAL_COMMUNITY): Payer: Medicare Other

## 2018-07-26 ENCOUNTER — Emergency Department (HOSPITAL_COMMUNITY)
Admission: EM | Admit: 2018-07-26 | Discharge: 2018-07-26 | Disposition: A | Discharge status: Home / Self Care | Payer: Medicare Other | Attending: Emergency Medicine | Admitting: Emergency Medicine

## 2018-07-26 DIAGNOSIS — R141 Gas pain: Secondary | ICD-10-CM | POA: Insufficient documentation

## 2018-07-26 DIAGNOSIS — R1030 Lower abdominal pain, unspecified: Secondary | ICD-10-CM | POA: Diagnosis not present

## 2018-07-26 DIAGNOSIS — Z79899 Other long term (current) drug therapy: Secondary | ICD-10-CM | POA: Insufficient documentation

## 2018-07-26 DIAGNOSIS — R319 Hematuria, unspecified: Secondary | ICD-10-CM | POA: Diagnosis not present

## 2018-07-26 DIAGNOSIS — F329 Major depressive disorder, single episode, unspecified: Secondary | ICD-10-CM | POA: Diagnosis not present

## 2018-07-26 DIAGNOSIS — R109 Unspecified abdominal pain: Secondary | ICD-10-CM | POA: Diagnosis present

## 2018-07-26 DIAGNOSIS — R1084 Generalized abdominal pain: Secondary | ICD-10-CM | POA: Diagnosis not present

## 2018-07-26 HISTORY — DX: Unspecified osteoarthritis, unspecified site: M19.90

## 2018-07-26 HISTORY — DX: Depression, unspecified: F32.A

## 2018-07-26 HISTORY — DX: Major depressive disorder, single episode, unspecified: F32.9

## 2018-07-26 LAB — COMPREHENSIVE METABOLIC PANEL
ALBUMIN: 4.3 g/dL (ref 3.5–5.0)
ALK PHOS: 52 U/L (ref 38–126)
ALT: 11 U/L (ref 0–44)
AST: 18 U/L (ref 15–41)
Anion gap: 9 (ref 5–15)
BUN: 10 mg/dL (ref 6–20)
CO2: 27 mmol/L (ref 22–32)
Calcium: 9.5 mg/dL (ref 8.9–10.3)
Chloride: 104 mmol/L (ref 98–111)
Creatinine, Ser: 0.58 mg/dL (ref 0.44–1.00)
GFR calc non Af Amer: >60 mL/min (ref >60)
GLUCOSE: 99 mg/dL (ref 70–99)
POTASSIUM: 3.5 mmol/L (ref 3.5–5.1)
Sodium: 140 mmol/L (ref 135–145)
Total Bilirubin: 0.7 mg/dL (ref 0.3–1.2)
Total Protein: 7.5 g/dL (ref 6.5–8.1)

## 2018-07-26 LAB — CBC
HEMATOCRIT: 37.1 % (ref 36.0–46.0)
Hemoglobin: 11.7 g/dL — ABNORMAL LOW (ref 12.0–15.0)
MCH: 28.1 pg (ref 26.0–34.0)
MCHC: 31.5 g/dL (ref 30.0–36.0)
MCV: 89 fL (ref 78.0–100.0)
Platelets: 379 10*3/uL (ref 150–400)
RBC: 4.17 MIL/uL (ref 3.87–5.11)
RDW: 14.1 % (ref 11.5–15.5)
WBC: 4.7 10*3/uL (ref 4.0–10.5)

## 2018-07-26 LAB — URINALYSIS, ROUTINE W REFLEX MICROSCOPIC
BILIRUBIN URINE: NEGATIVE
Bacteria, UA: NONE SEEN
Glucose, UA: NEGATIVE mg/dL
KETONES UR: NEGATIVE mg/dL
LEUKOCYTES UA: NEGATIVE
Nitrite: NEGATIVE
PROTEIN: NEGATIVE mg/dL
Specific Gravity, Urine: 1.011 (ref 1.005–1.030)
pH: 6 (ref 5.0–8.0)

## 2018-07-26 LAB — I-STAT BETA HCG BLOOD, ED (MC, WL, AP ONLY): I-stat hCG, quantitative: <5 m[IU]/mL (ref <5)

## 2018-07-26 LAB — RAPID URINE DRUG SCREEN, HOSP PERFORMED
Amphetamines: NOT DETECTED
BARBITURATES: NOT DETECTED
Benzodiazepines: NOT DETECTED
Cocaine: NOT DETECTED
Opiates: NOT DETECTED
TETRAHYDROCANNABINOL: NOT DETECTED

## 2018-07-26 LAB — PREGNANCY, URINE: PREG TEST UR: NEGATIVE

## 2018-07-26 LAB — ETHANOL

## 2018-07-26 LAB — LIPASE, BLOOD: Lipase: 37 U/L (ref 11–51)

## 2018-07-26 MED ORDER — ACETAMINOPHEN 500 MG PO TABS
1000.0000 mg | ORAL_TABLET | Freq: Once | ORAL | Status: AC
  Administered 2018-07-26: 1000 mg via ORAL
  Filled 2018-07-26: qty 2

## 2018-07-26 MED ORDER — DICYCLOMINE HCL 20 MG PO TABS: 20.0000 mg | ORAL_TABLET | Freq: Two times a day (BID) | ORAL | 0 refills | Status: DC

## 2018-07-26 MED ORDER — GI COCKTAIL ~~LOC~~
30.0000 mL | Freq: Once | ORAL | Status: AC
  Administered 2018-07-26: 30 mL via ORAL
  Filled 2018-07-26: qty 30

## 2018-07-26 NOTE — H&P (Signed)
Behavioral Health Medical Screening Exam  [name] [name] is an 42 y.o. female patient [facility] with complaints of depression and medication not working.  Patient denies suicidal/self-harm/homicidal ideation, psychosis, and paranoia.  Patient states that she doesn't feel that her medication is not working and has [facility].  Patient is also working with transitional team and has been put out of several houses.  Spoke to Eugene GaviaLaura King [phone] who states that they are having a problem finding a place for patient to stay related to her threaten others.  Patient was seen earlier at hospital for medical reasons.     Total Time spent with patient: 45 minutes  Psychiatric Specialty Exam: Physical Exam  Vitals reviewed. Constitutional: She is oriented to person, place, and time. She appears well-nourished.  Neck: Normal range of motion. Neck supple.  Respiratory: Effort normal.  Musculoskeletal: Normal range of motion.  Neurological: She is alert and oriented to person, place, and time.  Skin: Skin is warm and dry.  Psychiatric: Her speech is normal and behavior is normal. Thought content is not paranoid and not delusional. Cognition and memory are normal. She expresses impulsivity. She exhibits a depressed mood (Stable). She expresses no homicidal and no suicidal ideation.    Review of Systems  Psychiatric/Behavioral: Depression: Stable. Hallucinations: Denies. Substance abuse: Denies. Suicidal ideas: Denies. Nervous/anxious: Denies. Insomnia: Denies.   All other systems reviewed and are negative.   Last menstrual period 07/22/2018.There is no height or weight on file to calculate BMI.  General Appearance: Casual  Eye Contact:  Good  Speech:  Clear and Coherent and Normal Rate  Volume:  Normal  Mood:  Depressed  Affect:  Congruent  Thought Process:  Coherent and Goal Directed  Orientation:  Full (Time, Place, and Person)  Thought Content:  Denies  hallucinations, delusions and paranoia  Suicidal Thoughts:  No  Homicidal Thoughts:  No  Memory:  Immediate;   Good Recent;   Good Remote;   Good  Judgement:  Intact  Insight:  Fair  Psychomotor Activity:  Normal  Concentration: Concentration: Good and Attention Span: Good  Recall:  Good  Fund of Knowledge:Fair  Language: Good  Akathisia:  No  Handed:  Right  AIMS (if indicated):     Assets:  Communication Skills Desire for Improvement  Sleep:       Musculoskeletal: Strength & Muscle Tone: within normal limits Gait & Station: normal Patient leans: N/A  Last menstrual period 07/22/2018.  Recommendations:  Outpatient psychiatric services Eugene GaviaLaura King [facility] informed if they did not feel that patient was able to care for herself then they should contact APS for an assessment for guardianship but [facility],  Disposition: No evidence of imminent risk to self or others at present.   Patient does not meet criteria for psychiatric inpatient admission. Supportive therapy provided about ongoing stressors. Discussed crisis plan, support from social network, calling 911, coming to the Emergency Department, and calling Suicide Hotline.  Based on my evaluation the patient does not appear to have an emergency medical condition.  Jaki Hammerschmidt, NP 07/26/2018, 1:53 PM

## 2018-07-26 NOTE — ED Triage Notes (Signed)
[facility] reports RLQ pain onset this evening , denies emesis or diarrhea , no fever or chills , pt. added feeling depressed , denies suicidal ideation / no hallucinations .

## 2018-07-26 NOTE — ED Notes (Signed)
Patient transported to CT 
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
## 2018-07-26 NOTE — ED Provider Notes (Signed)
[facility] EMERGENCY DEPARTMENT Provider Note   CSN: [id number] Arrival date & time: 07/26/18  0056     History   Chief Complaint Chief Complaint  Patient presents with  . Abdominal Pain  . Depressed    HPI [name] [name] is a 42 y.o. female.  The history is provided by the patient.  Abdominal Pain   This is a new problem. The current episode started 12 to 24 hours ago. The problem occurs constantly. The problem has not changed since onset.The pain is associated with an unknown factor. The pain is located in the generalized abdominal region. The quality of the pain is cramping. The pain is moderate. Pertinent negatives include anorexia, fever, belching, diarrhea, flatus, hematochezia, melena, nausea, vomiting, constipation, dysuria, frequency, hematuria, headaches, arthralgias and myalgias. Nothing aggravates the symptoms. Nothing relieves the symptoms. Past workup does not include GI consult. Her past medical history does not include PUD.  Also feels a bit depressed.  No SI or HI no AH or VH.  Does not feel she needs to seen TTS.  No f/c/r.  No n/v/d.    Past Medical History:  Diagnosis Date  . Arthritis   . Depression     Patient Active Problem List   Diagnosis Date Noted  . Bipolar affective disorder, manic, severe, with psychotic behavior (HCC) 06/24/2018  . Bipolar I disorder, current or most recent episode manic, with psychotic features (HCC) 06/24/2018    History reviewed. No pertinent surgical history.   OB History   None      Home Medications    Prior to Admission medications   Medication Sig Start Date End Date Taking? Authorizing Provider  ATIVAN 2 MG tablet Take 2 mg by mouth 2 (two) times daily as needed for anxiety or sleep.  06/16/18  Yes [provider]  OLANZapine (ZYPREXA) 10 MG tablet Take 1 tablet (10 mg total) by mouth 2 (two) times daily. For mood control 06/28/18  Yes Armandina Stammer I, NP  traZODone (DESYREL) 50 MG tablet  Take 1 tablet (50 mg total) by mouth at bedtime as needed for sleep. 06/28/18  Yes Armandina Stammer I, NP  famotidine (PEPCID) 20 MG tablet Take 1 tablet (20 mg total) by mouth 2 (two) times daily. For acid reflux Patient not taking: Reported on 07/21/2018 06/28/18   Armandina Stammer I, NP  hydrOXYzine (ATARAX/VISTARIL) 50 MG tablet Take 1 tablet (50 mg total) by mouth every 6 (six) hours as needed for anxiety. Patient not taking: Reported on 07/21/2018 06/28/18   Armandina Stammer I, NP    Family History No family history on file.  Social History Social History   Tobacco Use  . Smoking status: Never Smoker  . Smokeless tobacco: Never Used  Substance Use Topics  . Alcohol use: Not Currently  . Drug use: Not Currently     Allergies   Patient has no known allergies.   Review of Systems Review of Systems  Constitutional: Negative for fever.  Gastrointestinal: Positive for abdominal pain. Negative for anorexia, constipation, diarrhea, flatus, hematochezia, melena, nausea and vomiting.  Genitourinary: Negative for dysuria, frequency and hematuria.  Musculoskeletal: Negative for arthralgias and myalgias.  Neurological: Negative for headaches.  Psychiatric/Behavioral: Positive for dysphoric mood. Negative for hallucinations, self-injury, sleep disturbance and suicidal ideas. The patient is not nervous/anxious.   All other systems reviewed and are negative.    Physical Exam Updated Vital Signs BP 117/83 (BP Location: Right Arm)   Pulse 79   Temp 98.1  F (36.7 C) (Oral)   Resp 16   LMP 07/22/2018   SpO2 100%   Physical Exam  Constitutional: She is oriented to person, place, and time. She appears well-developed and well-nourished. No distress.  HENT:  Head: Normocephalic and atraumatic.  Mouth/Throat: Oropharynx is clear and moist. No oropharyngeal exudate.  Eyes: Pupils are equal, round, and reactive to light. Conjunctivae are normal.  Neck: Normal range of motion. Neck supple.    Cardiovascular: Normal rate, regular rhythm, normal heart sounds and intact distal pulses.  Pulmonary/Chest: Effort normal and breath sounds normal. No stridor. She has no wheezes. She has no rales.  Abdominal: Soft. Bowel sounds are normal. She exhibits no mass. There is no tenderness. There is no rebound and no guarding.  Musculoskeletal: Normal range of motion.  Neurological: She is alert and oriented to person, place, and time. She displays normal reflexes.  Skin: Skin is warm and dry. Capillary refill takes less than 2 seconds.  Psychiatric: She has a normal mood and affect. She is not aggressive. She expresses no homicidal and no suicidal ideation. She expresses no suicidal plans and no homicidal plans.  Nursing note and vitals reviewed.    ED Treatments / Results  Labs (all labs ordered are listed, but only abnormal results are displayed) Results for orders placed or performed during the hospital encounter of 07/26/18  Lipase, blood  Result Value Ref Range   Lipase 37 11 - 51 U/L  Comprehensive metabolic panel  Result Value Ref Range   Sodium 140 135 - 145 mmol/L   Potassium 3.5 3.5 - 5.1 mmol/L   Chloride 104 98 - 111 mmol/L   CO2 27 22 - 32 mmol/L   Glucose, Bld 99 70 - 99 mg/dL   BUN 10 6 - 20 mg/dL   Creatinine, Ser 1.61 0.44 - 1.00 mg/dL   Calcium 9.5 8.9 - 09.6 mg/dL   Total Protein 7.5 6.5 - 8.1 g/dL   Albumin 4.3 3.5 - 5.0 g/dL   AST 18 15 - 41 U/L   ALT 11 0 - 44 U/L   Alkaline Phosphatase 52 38 - 126 U/L   Total Bilirubin 0.7 0.3 - 1.2 mg/dL   GFR calc non Af Amer >60 >60 mL/min   GFR calc Af Amer >60 >60 mL/min   Anion gap 9 5 - 15  CBC  Result Value Ref Range   WBC 4.7 4.0 - 10.5 K/uL   RBC 4.17 3.87 - 5.11 MIL/uL   Hemoglobin 11.7 (L) 12.0 - 15.0 g/dL   HCT 04.5 40.9 - 81.1 %   MCV 89.0 78.0 - 100.0 fL   MCH 28.1 26.0 - 34.0 pg   MCHC 31.5 30.0 - 36.0 g/dL   RDW 91.4 78.2 - 95.6 %   Platelets 379 150 - 400 K/uL  Urinalysis, Routine w reflex  microscopic  Result Value Ref Range   Color, Urine YELLOW YELLOW   APPearance HAZY (A) CLEAR   Specific Gravity, Urine 1.011 1.005 - 1.030   pH 6.0 5.0 - 8.0   Glucose, UA NEGATIVE NEGATIVE mg/dL   Hgb urine dipstick SMALL (A) NEGATIVE   Bilirubin Urine NEGATIVE NEGATIVE   Ketones, ur NEGATIVE NEGATIVE mg/dL   Protein, ur NEGATIVE NEGATIVE mg/dL   Nitrite NEGATIVE NEGATIVE   Leukocytes, UA NEGATIVE NEGATIVE   RBC / HPF 0-5 0 - 5 RBC/hpf   WBC, UA 0-5 0 - 5 WBC/hpf   Bacteria, UA NONE SEEN NONE SEEN   Squamous Epithelial /  LPF 6-10 0 - 5   Mucus PRESENT   Rapid urine drug screen (hospital performed)  Result Value Ref Range   Opiates NONE DETECTED NONE DETECTED   Cocaine NONE DETECTED NONE DETECTED   Benzodiazepines NONE DETECTED NONE DETECTED   Amphetamines NONE DETECTED NONE DETECTED   Tetrahydrocannabinol NONE DETECTED NONE DETECTED   Barbiturates NONE DETECTED NONE DETECTED  Ethanol  Result Value Ref Range   Alcohol, Ethyl (B) <10 <10 mg/dL  Pregnancy, urine  Result Value Ref Range   Preg Test, Ur NEGATIVE NEGATIVE  I-Stat beta hCG blood, ED  Result Value Ref Range   I-stat hCG, quantitative <5.0 <5 mIU/mL   Comment 3           No results found.  EKG None  Radiology No results found.  Procedures Procedures (including critical care time)  Medications Ordered in ED Medications  gi cocktail (Maalox,Lidocaine,Donnatal) (30 mLs Oral Given 07/26/18 0451)        Final Clinical Impressions(s) / ED Diagnoses  Contracted for safety.  No SI or HI.    Return for fevers >100.4 unrelieved by medication, shortness of breath, intractable vomiting, or diarrhea, Inability to tolerate liquids or food, cough, altered mental status or any concerns. No signs of systemic illness or infection. The patient is nontoxic-appearing on exam and vital signs are within normal limits.   I have reviewed the triage vital signs and the nursing notes. Pertinent labs &imaging results  that were available during my care of the patient were reviewed by me and considered in my medical decision making (see chart for details).  After history, exam, and medical workup I feel the patient has been appropriately medically screened and is safe for discharge home. Pertinent diagnoses were discussed with the patient. Patient was given return precautions.      Constanza Mincy, MD 07/26/18 [id number]

## 2018-08-12 DIAGNOSIS — G4489 Other headache syndrome: Secondary | ICD-10-CM | POA: Diagnosis not present

## 2018-08-13 ENCOUNTER — Other Ambulatory Visit: Payer: Self-pay

## 2018-08-13 ENCOUNTER — Emergency Department (HOSPITAL_COMMUNITY)
Admission: EM | Admit: 2018-08-13 | Discharge: 2018-08-13 | Disposition: A | Discharge status: Home / Self Care | Payer: Medicare Other | Attending: Emergency Medicine | Admitting: Emergency Medicine

## 2018-08-13 ENCOUNTER — Encounter (HOSPITAL_COMMUNITY): Payer: Self-pay | Admitting: *Deleted

## 2018-08-13 DIAGNOSIS — R519 Headache, unspecified: Secondary | ICD-10-CM

## 2018-08-13 DIAGNOSIS — R42 Dizziness and giddiness: Secondary | ICD-10-CM | POA: Diagnosis not present

## 2018-08-13 DIAGNOSIS — R51 Headache: Secondary | ICD-10-CM | POA: Insufficient documentation

## 2018-08-13 MED ORDER — MECLIZINE HCL 25 MG PO TABS
25.0000 mg | ORAL_TABLET | Freq: Once | ORAL | Status: AC
  Administered 2018-08-13: 25 mg via ORAL

## 2018-08-13 MED ORDER — MECLIZINE HCL 25 MG PO TABS: 25.0000 mg | ORAL_TABLET | Freq: Three times a day (TID) | ORAL | 0 refills | Status: DC | PRN

## 2018-08-13 MED ORDER — ACETAMINOPHEN 325 MG PO TABS
650.0000 mg | ORAL_TABLET | Freq: Once | ORAL | Status: AC
  Administered 2018-08-13: 650 mg via ORAL

## 2018-08-13 MED ORDER — SODIUM CHLORIDE 0.9 % IV BOLUS: 1000.0000 mL | Freq: Once | INTRAVENOUS | Status: DC

## 2018-08-13 MED ORDER — MECLIZINE HCL 25 MG PO TABS
ORAL_TABLET | ORAL | Status: AC
  Filled 2018-08-13: qty 1

## 2018-08-13 MED ORDER — DIPHENHYDRAMINE HCL 50 MG/ML IJ SOLN
25.0000 mg | Freq: Once | INTRAMUSCULAR | Status: DC
  Filled 2018-08-13: qty 1

## 2018-08-13 MED ORDER — METOCLOPRAMIDE HCL 10 MG PO TABS
10.0000 mg | ORAL_TABLET | Freq: Once | ORAL | Status: AC
  Administered 2018-08-13: 10 mg via ORAL
  Filled 2018-08-13: qty 1

## 2018-08-13 MED ORDER — ACETAMINOPHEN 325 MG PO TABS
ORAL_TABLET | ORAL | Status: AC
  Filled 2018-08-13: qty 2

## 2018-08-13 MED ORDER — METOCLOPRAMIDE HCL 5 MG/ML IJ SOLN
10.0000 mg | Freq: Once | INTRAMUSCULAR | Status: DC
  Filled 2018-08-13: qty 2

## 2018-08-13 NOTE — ED Notes (Addendum)
Attempted to draw blood and start IV to give patient medication for her headache. Pt is is refusing IV, any needles and medication. MD notified with

## 2018-08-13 NOTE — ED Provider Notes (Signed)
[facility]-EMERGENCY DEPT Provider Note   CSN: [id number] Arrival date & time: 08/13/18  0011     History   Chief Complaint Chief Complaint  Patient presents with  . Headache    HPI [name] [name] is a 42 y.o. female.  The history is provided by the patient.  She has a history of bipolar disorder and comes in complaining of headache and dizziness today.  She is an extremely poor historian, and cannot specify when symptoms started, cannot describe the nature of her pain or dizziness, cannot make any statement on severity.  Past Medical History:  Diagnosis Date  . Arthritis   . Depression     Patient Active Problem List   Diagnosis Date Noted  . Bipolar affective disorder, manic, severe, with psychotic behavior (HCC) 06/24/2018  . Bipolar I disorder, current or most recent episode manic, with psychotic features (HCC) 06/24/2018    History reviewed. No pertinent surgical history.   OB History   None      Home Medications    Prior to Admission medications   Medication Sig Start Date End Date Taking? Authorizing Provider  ATIVAN 2 MG tablet Take 2 mg by mouth 2 (two) times daily as needed for anxiety or sleep.  06/16/18   [provider]  dicyclomine (BENTYL) 20 MG tablet Take 1 tablet (20 mg total) by mouth 2 (two) times daily. 07/26/18   Palumbo, April, MD  famotidine (PEPCID) 20 MG tablet Take 1 tablet (20 mg total) by mouth 2 (two) times daily. For acid reflux Patient not taking: Reported on 07/21/2018 06/28/18   Armandina Stammer I, NP  hydrOXYzine (ATARAX/VISTARIL) 50 MG tablet Take 1 tablet (50 mg total) by mouth every 6 (six) hours as needed for anxiety. Patient not taking: Reported on 07/21/2018 06/28/18   Armandina Stammer I, NP  OLANZapine (ZYPREXA) 10 MG tablet Take 1 tablet (10 mg total) by mouth 2 (two) times daily. For mood control 06/28/18   Armandina Stammer I, NP  traZODone (DESYREL) 50 MG tablet Take 1 tablet (50 mg total) by mouth at bedtime  as needed for sleep. 06/28/18   Sanjuana Kava, NP    Family History No family history on file.  Social History Social History   Tobacco Use  . Smoking status: Never Smoker  . Smokeless tobacco: Never Used  Substance Use Topics  . Alcohol use: Not Currently  . Drug use: Not Currently     Allergies   Patient has no known allergies.   Review of Systems Review of Systems  All other systems reviewed and are negative.    Physical Exam Updated Vital Signs BP 118/83   Pulse 80   Temp 97.8 F (36.6 C) (Oral)   Resp 16   LMP 07/22/2018   SpO2 100%   Physical Exam  Nursing note and vitals reviewed.  42 year old female, resting comfortably and in no acute distress. Vital signs are normal. Oxygen saturation is 100%, which is normal. Head is normocephalic and atraumatic. PERRLA, EOMI. Oropharynx is clear. Neck is nontender and supple without adenopathy or JVD. Back is nontender and there is no CVA tenderness. Lungs are clear without rales, wheezes, or rhonchi. Chest is nontender. Heart has regular rate and rhythm without murmur. Abdomen is soft, flat, nontender without masses or hepatosplenomegaly and peristalsis is normoactive. Extremities have no cyanosis or edema, full range of motion is present. Skin is warm and dry without rash. Neurologic: Awake and alert but with a  very flat affect, cranial nerves are intact, there are no motor or sensory deficits.  ED Treatments / Results  Labs (all labs ordered are listed, but only abnormal results are displayed) Labs Reviewed  I-STAT BETA HCG BLOOD, ED ([facility], [facility], [facility] ONLY)   Procedures Procedures   Medications Ordered in ED Medications  sodium chloride 0.9 % bolus 1,000 mL (has no administration in time range)  metoCLOPramide (REGLAN) injection 10 mg (has no administration in time range)  diphenhydrAMINE (BENADRYL) injection 25 mg (has no administration in time range)     Initial Impression / Assessment and Plan / ED  Course  I have reviewed the triage vital signs and the nursing notes.  Pertinent lab results that were available during my care of the patient were reviewed by me and considered in my medical decision making (see chart for details).  Headache and dizziness of uncertain cause.  Patient does not appear to be in any distress at night do not have strong concerns about serious pathology.  Because of inability to get a good history from patient, will try empiric treatment with a headache cocktail and reassess.  Old records are reviewed, and I see no prior similar visits.  12:57 AM Patient is refusing IV, requesting only oral medications.  She is given a dose of oral metoclopramide, acetaminophen, meclizine.  3:33 AM She had excellent relief of symptoms with above-noted treatment.  She is discharged with prescription for meclizine.  Told to use over-the-counter analgesics as needed for pain.  Final Clinical Impressions(s) / ED Diagnoses   Final diagnoses:  Headache, unspecified headache type  Dizziness    ED Discharge Orders         Ordered    meclizine (ANTIVERT) 25 MG tablet  3 times daily PRN     08/13/18 0332           Dione Booze, MD 08/13/18 [id number]

## 2018-08-13 NOTE — ED Notes (Signed)
Pt refusing to get up out of bed to be discharged, security at bedside to assist

## 2018-08-13 NOTE — ED Triage Notes (Signed)
Pt c/o headache for 3 hours.
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
## 2018-08-13 NOTE — ED Notes (Signed)
Attempted to discharge pt twice and pt is refusing to get out of the bed.

## 2018-08-22 DIAGNOSIS — F29 Unspecified psychosis not due to a substance or known physiological condition: Secondary | ICD-10-CM | POA: Diagnosis not present

## 2018-08-23 ENCOUNTER — Encounter (HOSPITAL_COMMUNITY): Payer: Self-pay

## 2018-08-23 ENCOUNTER — Other Ambulatory Visit: Payer: Self-pay

## 2018-08-23 ENCOUNTER — Emergency Department (HOSPITAL_COMMUNITY)
Admission: EM | Admit: 2018-08-23 | Discharge: 2018-08-23 | Disposition: A | Discharge status: Home / Self Care | Payer: Medicare Other | Attending: Emergency Medicine | Admitting: Emergency Medicine

## 2018-08-23 DIAGNOSIS — F419 Anxiety disorder, unspecified: Secondary | ICD-10-CM | POA: Insufficient documentation

## 2018-08-23 DIAGNOSIS — F319 Bipolar disorder, unspecified: Secondary | ICD-10-CM | POA: Insufficient documentation

## 2018-08-23 DIAGNOSIS — F410 Panic disorder [episodic paroxysmal anxiety] without agoraphobia: Secondary | ICD-10-CM | POA: Diagnosis present

## 2018-08-23 NOTE — Discharge Instructions (Addendum)
Follow-up with your doctor at [facility]. Return to the emergency room if you develop suicidal thoughts.  Return with any new, worsening, or concerning symptoms.

## 2018-08-23 NOTE — ED Provider Notes (Signed)
[facility]-EMERGENCY DEPT Provider Note   CSN: [id number] Arrival date & time: 08/23/18  0021     History   Chief Complaint Chief Complaint  Patient presents with  . Panic Attack    HPI [name] [name] is a 42 y.o. female presenting for evaluation of agitation and feeling upset.  Patient states that just prior to arrival, she broke up with her boyfriend.  She subsequently felt very upset, started crying.  She states that she came to the emergency room because she needed a quiet place to calm down.  She currently denies feeling very upset.  She denies SI, HI, or AVH.  Patient states she has been diagnosed with bipolar, is supposed to be taking Zyprexa but has not been taking it for several weeks because the diagnosis is wrong.  She states she has no other medical problems, does not take any medications daily.  Patient follows with [facility].  Patient states that earlier when she was upset, she did not feel suicidal or like she wanted to hurt herself.   HPI  Past Medical History:  Diagnosis Date  . Arthritis   . Depression     Patient Active Problem List   Diagnosis Date Noted  . Bipolar affective disorder, manic, severe, with psychotic behavior (HCC) 06/24/2018  . Bipolar I disorder, current or most recent episode manic, with psychotic features (HCC) 06/24/2018    History reviewed. No pertinent surgical history.   OB History   None      Home Medications    Prior to Admission medications   Medication Sig Start Date End Date Taking? Authorizing Provider  hydrOXYzine (ATARAX/VISTARIL) 10 MG tablet Take 10 mg by mouth 3 (three) times daily as needed for anxiety.  07/29/18  Yes [provider]  OLANZapine (ZYPREXA) 10 MG tablet Take 1 tablet (10 mg total) by mouth 2 (two) times daily. For mood control 06/28/18  Yes Armandina Stammer I, NP  traZODone (DESYREL) 50 MG tablet Take 1 tablet (50 mg total) by mouth at bedtime as needed for sleep. 06/28/18  Yes  Armandina Stammer I, NP  meclizine (ANTIVERT) 25 MG tablet Take 1 tablet (25 mg total) by mouth 3 (three) times daily as needed for dizziness. 08/13/18   Dione Booze, MD    Family History No family history on file.  Social History Social History   Tobacco Use  . Smoking status: Never Smoker  . Smokeless tobacco: Never Used  Substance Use Topics  . Alcohol use: Not Currently  . Drug use: Not Currently     Allergies   Patient has no known allergies.   Review of Systems Review of Systems  Neurological: Negative for dizziness.  Psychiatric/Behavioral: Positive for dysphoric mood. Negative for agitation, behavioral problems, self-injury and suicidal ideas.     Physical Exam Updated Vital Signs BP 106/68 (BP Location: Right Arm)   Pulse 66   Temp 97.7 F (36.5 C) (Oral)   Resp 16   SpO2 95%   Physical Exam  Constitutional: She is oriented to person, place, and time. She appears well-developed and well-nourished. No distress.  Appears nontoxic  HENT:  Head: Normocephalic and atraumatic.  Eyes: EOM are normal.  Neck: Normal range of motion.  Cardiovascular: Normal rate, regular rhythm and intact distal pulses.  Pulmonary/Chest: Effort normal and breath sounds normal. No respiratory distress. She has no wheezes.  Abdominal: She exhibits no distension.  Musculoskeletal: Normal range of motion.  Neurological: She is alert and oriented to person,  place, and time.  Skin: Skin is warm. No rash noted.  Psychiatric: She has a normal mood and affect. Her speech is not rapid and/or pressured. She is withdrawn. She is not agitated. She expresses no homicidal and no suicidal ideation. She expresses no suicidal plans and no homicidal plans.  Calm and cooperative.  Behavior is withdrawn.  Denies SI, HI, or AVH.  Nursing note and vitals reviewed.    ED Treatments / Results  Labs (all labs ordered are listed, but only abnormal results are displayed) Labs Reviewed - No data to  display  EKG None  Radiology No results found.  Procedures Procedures (including critical care time)  Medications Ordered in ED Medications - No data to display   Initial Impression / Assessment and Plan / ED Course  I have reviewed the triage vital signs and the nursing notes.  Pertinent labs & imaging results that were available during my care of the patient were reviewed by me and considered in my medical decision making (see chart for details).     Patient presenting due to feeling upset after breaking up with her boyfriend.  Denies SI, HI, or AVH.  Physical exam is patient who is call and cooperative.  Patient denies any thoughts about wanting to hurt herself today.  She is not currently taking her Zyprexa, but states she can follow-up with [facility] tomorrow.  Discussed with patient that at this time, I do not believe she is a danger to herself or anyone else.  I do not believe she would benefit qualify for inpatient admission.  Encouraged patient to follow-up with [facility] tomorrow.  Strict return precautions given, including SI.  At this time, patient appears safe for discharge.  Return precautions given.  Patient states she understands and agrees to plan.   Final Clinical Impressions(s) / ED Diagnoses   Final diagnoses:  Anxiety    ED Discharge Orders    None       Alveria Apley, PA-C 08/23/18 [id number]    Derwood Kaplan, MD 08/23/18 [phone]

## 2018-08-23 NOTE — ED Triage Notes (Signed)
Arived via [place] with c/o anxiety. Denies SI/HI. States she broke up with her boyfriend tonight and left the shelter.

## 2018-08-27 ENCOUNTER — Other Ambulatory Visit: Payer: Self-pay

## 2018-08-27 ENCOUNTER — Emergency Department (HOSPITAL_COMMUNITY)
Admission: EM | Admit: 2018-08-27 | Discharge: 2018-08-28 | Disposition: A | Discharge status: Home / Self Care | Payer: Medicare Other | Attending: Emergency Medicine | Admitting: Emergency Medicine

## 2018-08-27 ENCOUNTER — Encounter (HOSPITAL_COMMUNITY): Payer: Self-pay

## 2018-08-27 DIAGNOSIS — F29 Unspecified psychosis not due to a substance or known physiological condition: Secondary | ICD-10-CM | POA: Diagnosis not present

## 2018-08-27 DIAGNOSIS — F22 Delusional disorders: Secondary | ICD-10-CM | POA: Diagnosis not present

## 2018-08-27 DIAGNOSIS — F209 Schizophrenia, unspecified: Secondary | ICD-10-CM | POA: Diagnosis not present

## 2018-08-27 DIAGNOSIS — Z046 Encounter for general psychiatric examination, requested by authority: Secondary | ICD-10-CM | POA: Diagnosis not present

## 2018-08-27 DIAGNOSIS — R443 Hallucinations, unspecified: Secondary | ICD-10-CM | POA: Diagnosis not present

## 2018-08-27 DIAGNOSIS — F250 Schizoaffective disorder, bipolar type: Secondary | ICD-10-CM | POA: Diagnosis not present

## 2018-08-27 DIAGNOSIS — F3170 Bipolar disorder, currently in remission, most recent episode unspecified: Secondary | ICD-10-CM | POA: Diagnosis not present

## 2018-08-27 DIAGNOSIS — F200 Paranoid schizophrenia: Secondary | ICD-10-CM | POA: Diagnosis not present

## 2018-08-27 DIAGNOSIS — Z79899 Other long term (current) drug therapy: Secondary | ICD-10-CM | POA: Diagnosis not present

## 2018-08-27 LAB — CBC WITH DIFFERENTIAL/PLATELET
ABS IMMATURE GRANULOCYTES: 0.02 10*3/uL (ref 0.00–0.07)
BASOS PCT: 0 %
Basophils Absolute: 0 10*3/uL (ref 0.0–0.1)
EOS PCT: 3 %
Eosinophils Absolute: 0.1 10*3/uL (ref 0.0–0.5)
HCT: 36.7 % (ref 36.0–46.0)
HEMOGLOBIN: 11.5 g/dL — AB (ref 12.0–15.0)
IMMATURE GRANULOCYTES: 0 %
LYMPHS PCT: 34 %
Lymphs Abs: 1.5 10*3/uL (ref 0.7–4.0)
MCH: 27.8 pg (ref 26.0–34.0)
MCHC: 31.3 g/dL (ref 30.0–36.0)
MCV: 88.6 fL (ref 80.0–100.0)
MONO ABS: 0.5 10*3/uL (ref 0.1–1.0)
Monocytes Relative: 11 %
NRBC: 0 % (ref 0.0–0.2)
Neutro Abs: 2.3 10*3/uL (ref 1.7–7.7)
Neutrophils Relative %: 52 %
PLATELETS: 351 10*3/uL (ref 150–400)
RBC: 4.14 MIL/uL (ref 3.87–5.11)
RDW: 14 % (ref 11.5–15.5)
WBC: 4.5 10*3/uL (ref 4.0–10.5)

## 2018-08-27 LAB — COMPREHENSIVE METABOLIC PANEL
ALBUMIN: 4.3 g/dL (ref 3.5–5.0)
ALK PHOS: 48 U/L (ref 38–126)
ALT: 12 U/L (ref 0–44)
ANION GAP: 8 (ref 5–15)
AST: 18 U/L (ref 15–41)
BILIRUBIN TOTAL: 0.5 mg/dL (ref 0.3–1.2)
BUN: 13 mg/dL (ref 6–20)
CALCIUM: 9.3 mg/dL (ref 8.9–10.3)
CO2: 27 mmol/L (ref 22–32)
Chloride: 103 mmol/L (ref 98–111)
Creatinine, Ser: 0.58 mg/dL (ref 0.44–1.00)
GFR calc non Af Amer: >60 mL/min (ref >60)
GLUCOSE: 87 mg/dL (ref 70–99)
Potassium: 3.4 mmol/L — ABNORMAL LOW (ref 3.5–5.1)
SODIUM: 138 mmol/L (ref 135–145)
TOTAL PROTEIN: 7.2 g/dL (ref 6.5–8.1)

## 2018-08-27 LAB — ETHANOL: Alcohol, Ethyl (B): <10 mg/dL (ref <10)

## 2018-08-27 LAB — ACETAMINOPHEN LEVEL: Acetaminophen (Tylenol), Serum: <10 ug/mL — ABNORMAL LOW (ref 10–30)

## 2018-08-27 MED ORDER — ONDANSETRON HCL 4 MG PO TABS: 4.0000 mg | ORAL_TABLET | Freq: Three times a day (TID) | ORAL | Status: DC | PRN

## 2018-08-27 MED ORDER — TRAZODONE HCL 50 MG PO TABS
50.0000 mg | ORAL_TABLET | Freq: Every evening | ORAL | Status: DC | PRN
  Administered 2018-08-27: 50 mg via ORAL
  Filled 2018-08-27: qty 1

## 2018-08-27 MED ORDER — ACETAMINOPHEN 325 MG PO TABS
650.0000 mg | ORAL_TABLET | ORAL | Status: DC | PRN
  Administered 2018-08-28: 650 mg via ORAL
  Filled 2018-08-27: qty 2

## 2018-08-27 MED ORDER — ZIPRASIDONE MESYLATE 20 MG IM SOLR
20.0000 mg | Freq: Once | INTRAMUSCULAR | Status: AC
  Administered 2018-08-27: 20 mg via INTRAMUSCULAR
  Filled 2018-08-27: qty 20

## 2018-08-27 NOTE — ED Notes (Signed)
Bed: WA29 Expected date:  Expected time:  Means of arrival:  Comments: EMS-hallucinations 

## 2018-08-27 NOTE — ED Notes (Signed)
Pt smacking herself in the face, pleading help me.

## 2018-08-27 NOTE — ED Notes (Signed)
Requested urine specimen from pt, pt states she can not go at this time.  Specimen cup at bedside.

## 2018-08-27 NOTE — ED Notes (Signed)
Pt wanded by security. 

## 2018-08-27 NOTE — ED Notes (Signed)
Pt A&O x 3, denies SI, HI, admits to auditory hallucinations.  Pt reports she has been depressed.  Pt states she has been off her Psych meds for 2-3 weeks.  Monitoring for safety, Q 15 min checks in effect.

## 2018-08-27 NOTE — BH Assessment (Signed)
Assessment Note  [name] [name] is an 42 y.o. female presenting voluntarily to [facility] ED via EMS. Patient reported to clinician that she was at [facility] when the ambulance came because "God was talking to me and I could not get him to stop." Patient stated that God is telling "dirty men" to pursue her and that she is a virgin and does not want to have sex with them. Patient indicated she was paranoid about these "dirty men" and that she was tired because "God has been yelling at me my whole life." Patient stated that she has been living at the [facility]. She stated she had ACTT services with [facility] until 2 weeks ago when she terminated services. She stated she felt the housing they gave her was "dirty" and that she did not need medications because they thought she was "crazy." Patient reviewed she was taking Zyprexa, Ativan, and Trazodone but stopped them 2 weeks ago. Patient denies SI/HI, and visual hallucinations. Patient reports she does not get along with black women because they are all jealous of her for being a virgin. Patient has preoccupation with virginity. She stated multiple times during assessment "I'm a virgin and they won't even give me my own room to sleep in." Patient denies history of abuse or any substance use.  Patient does not have any current criminal charges.  Patient was alert and oriented x 4. She was dressed in scrubs and curled in a ball under a blanket. Her mood was anxious and paranoid. Affect was congruent. Patient was actively responding to internal stimuli and experiencing delusional thought content. Her speech was was soft and rapid. Her thought process was circumstantial.   Per Malachy Chamber, NP patient meets in patient criteria.   Diagnosis: F20.9 Schizophrenia  Past Medical History:  Past Medical History:  Diagnosis Date  . Arthritis   . Depression     History reviewed. No pertinent surgical history.  Family History: History reviewed. No pertinent  family history.  Social History:  reports that she has never smoked. She has never used smokeless tobacco. She reports that she drank alcohol. She reports that she has current or past drug history.  Additional Social History:  Alcohol / Drug Use Pain Medications: see MAR Prescriptions: see MAR Over the Counter: see MAR History of alcohol / drug use?: No history of alcohol / drug abuse Longest period of sobriety (when/how long): patient denies  CIWA: CIWA-Ar BP: 123/78 Pulse Rate: 90 COWS:    Allergies: No Known Allergies  Home Medications:  (Not in a hospital admission)  OB/GYN Status:  Patient's last menstrual period was 08/13/2018.  General Assessment Data Location of Assessment: [facility] ED TTS Assessment: In system Is this a Tele or Face-to-Face Assessment?: Face-to-Face Is this an Initial Assessment or a Re-assessment for this encounter?: Initial Assessment Patient Accompanied by:: N/A Language Other than English: No Living Arrangements: Homeless/Shelter What gender do you identify as?: Female Marital status: Single Maiden name: Buechler Pregnancy Status: No Living Arrangements: Other (Comment)([facility] ) Can pt return to current living arrangement?: Yes Admission Status: Voluntary Is patient capable of signing voluntary admission?: Yes Referral Source: (EMS) Insurance type: Medicare     Crisis Care Plan Living Arrangements: Other (Comment)([facility] ) Name of Psychiatrist: (none) Name of Therapist: none  Education Status Is patient currently in school?: Yes Current Grade: College Highest grade of school patient has completed: Unsure  Name of school: [employer] person: N/A IEP information if applicable: N/A Is the patient employed,  unemployed or receiving disability?: Receiving disability income  Risk to self with the past 6 months Suicidal Ideation: No Has patient been a risk to self within the past 6 months prior to admission? : No Suicidal  Intent: No Has patient had any suicidal intent within the past 6 months prior to admission? : No Is patient at risk for suicide?: No Suicidal Plan?: No Has patient had any suicidal plan within the past 6 months prior to admission? : No Access to Means: No What has been your use of drugs/alcohol within the last 12 months?: denies Previous Attempts/Gestures: No How many times?: 0 Other Self Harm Risks: none Triggers for Past Attempts: Unknown Intentional Self Injurious Behavior: Bruising Comment - Self Injurious Behavior: hitting self Family Suicide History: No Recent stressful life event(s): (homelessness) Persecutory voices/beliefs?: Yes(From God telling her to sleep with men) Depression: No Depression Symptoms: (none reported) Substance abuse history and/or treatment for substance abuse?: No Suicide prevention information given to non-admitted patients: Not applicable  Risk to Others within the past 6 months Homicidal Ideation: No Does patient have any lifetime risk of violence toward others beyond the six months prior to admission? : No Thoughts of Harm to Others: No Current Homicidal Intent: No Current Homicidal Plan: No Access to Homicidal Means: No Identified Victim: dnies History of harm to others?: No Assessment of Violence: None Noted Violent Behavior Description: denies Does patient have access to weapons?: No Criminal Charges Pending?: No Does patient have a court date: No Is patient on probation?: No  Psychosis Hallucinations: Auditory("God" yelling at her) Delusions: Erotomanic, Jealous, Grandiose  Mental Status Report Appearance/Hygiene: Unremarkable, In scrubs Eye Contact: Good Motor Activity: Unremarkable Speech: Soft, Rapid Level of Consciousness: Alert Mood: Anxious, Suspicious, Pleasant Affect: Flat Anxiety Level: Moderate Thought Processes: Circumstantial Judgement: Impaired Orientation: Place, Person, Time, Situation Obsessive Compulsive  Thoughts/Behaviors: Minimal  Cognitive Functioning Concentration: Normal Memory: Recent Intact, Remote Intact Is patient IDD: No Insight: Poor Impulse Control: Poor Appetite: Good Have you had any weight changes? : No Change Sleep: Decreased Total Hours of Sleep: 3 Vegetative Symptoms: None  ADLScreening [facility] Assessment Services) Patient's cognitive ability adequate to safely complete daily activities?: No Patient able to express need for assistance with ADLs?: Yes Independently performs ADLs?: Yes (appropriate for developmental age)  Prior Inpatient Therapy Prior Inpatient Therapy: Yes Prior Therapy Dates: 2019 Prior Therapy Facilty/Provider(s): [facility] Reason for Treatment: Psychosis  Prior Outpatient Therapy Prior Outpatient Therapy: Yes Prior Therapy Dates: 2019 Prior Therapy Facilty/Provider(s): [facility] Reason for Treatment: psychosis Does patient have an ACCT team?: Yes(until recently) Does patient have Intensive In-House Services?  : No Does patient have [facility] services? : No(previously) Does patient have P4CC services?: No  ADL Screening (condition at time of admission) Patient's cognitive ability adequate to safely complete daily activities?: No Is the patient deaf or have difficulty hearing?: No Does the patient have difficulty seeing, even when wearing glasses/contacts?: No Does the patient have difficulty concentrating, remembering, or making decisions?: Yes Patient able to express need for assistance with ADLs?: Yes Does the patient have difficulty dressing or bathing?: No Independently performs ADLs?: Yes (appropriate for developmental age) Does the patient have difficulty walking or climbing stairs?: No Weakness of Legs: None Weakness of Arms/Hands: None     Therapy Consults (therapy consults require a physician order) PT Evaluation Needed: No OT Evalulation Needed: No SLP Evaluation Needed: No Abuse/Neglect Assessment (Assessment to be complete while  patient is alone) Physical Abuse: Denies Verbal Abuse: Denies Sexual Abuse: Denies Exploitation  of patient/patient's resources: Denies Self-Neglect: Denies Values / Beliefs Cultural Requests During Hospitalization: None Spiritual Requests During Hospitalization: None Consults Spiritual Care Consult Needed: No Social Work Consult Needed: No Merchant navy officer (For Healthcare) Does Patient Have a Medical Advance Directive?: No Does patient want to make changes to medical advance directive?: No - Patient declined Would patient like information on creating a medical advance directive?: No - Patient declined          Disposition: Per Malachy Chamber, NP patient meets in patient criteria. Disposition Initial Assessment Completed for this Encounter: Yes  On Site Evaluation by:   Reviewed with Physician:    Celedonio Miyamoto 08/27/2018 5:36 PM

## 2018-08-27 NOTE — ED Notes (Addendum)
Pt states that God is trying to kill her and people are trying to rape her and she is hearing voices.  Pt reassured that she is in a safe place, no harm is going to come to her. Pt screaming out at voices.

## 2018-08-27 NOTE — ED Provider Notes (Signed)
[facility]-EMERGENC[facility] DEPT Provider Note   CSN: [id number] Arrival date & time: 08/27/18  1545     History   Chief Complaint Chief Complaint  Patient presents with  . Hallucinations  . Medical Clearance    HPI [name] [name] is a 42 y.o. female.  HPI Patient reports that God is talking to her constantly.  Reports she needs that to stop.  He reports that she is a virgin but God sends people to try to bother her and touch her.  She reports she came here from [place] about 2 months ago.  She reports that in [place] God sent a lot of people who were trying to touch her and doing inappropriate things.  She reports she has a boyfriend who is going to be coming here soon.  He is moving from [place] to help her.  She reports she is not having sex with him and is a virgin.  She reports she does not take the Zyprexa that is prescribed to her because she does not need it.  She reports when she was in [place] they tried to give her a lot of shots of medications and that was really bad.  She reports that he did give her a place to stay at home a shelter.  She states that she just sits there and watches to make sure nobody will bother her.  She reports people stare at her a lot which is uncomfortable but nobody is touching her or doing anything to her.  Patient speech was tangential, pressured and rambling.  She repeated the same sentiments several times over.  Patient is pleasant and interactive but her thoughts remain confined to God talking to her constantly, which she wants to have stop, sexuality, she harkens to the age 48 frequently and her mental health, recognizing that she is paranoid.. Past Medical History:  Diagnosis Date  . Arthritis   . Depression     Patient Active Problem List   Diagnosis Date Noted  . Bipolar affective disorder, manic, severe, with psychotic behavior (HCC) 06/24/2018  . Bipolar I disorder, current or most recent episode manic, with  psychotic features (HCC) 06/24/2018    History reviewed. No pertinent surgical history.   OB History   None      Home Medications    Prior to Admission medications   Medication Sig Start Date End Date Taking? Authorizing Provider  hydrOXYzine (ATARAX/VISTARIL) 10 MG tablet Take 10 mg by mouth 3 (three) times daily as needed for anxiety.  07/29/18  Yes [provider]  OLANZapine (ZYPREXA) 10 MG tablet Take 1 tablet (10 mg total) by mouth 2 (two) times daily. For mood control 06/28/18  Yes Armandina Stammer I, NP  traZODone (DESYREL) 50 MG tablet Take 1 tablet (50 mg total) by mouth at bedtime as needed for sleep. 06/28/18  Yes Armandina Stammer I, NP  meclizine (ANTIVERT) 25 MG tablet Take 1 tablet (25 mg total) by mouth 3 (three) times daily as needed for dizziness. Patient not taking: Reported on 08/27/2018 08/13/18   Dione Booze, MD    Family History History reviewed. No pertinent family history.  Social History Social History   Tobacco Use  . Smoking status: Never Smoker  . Smokeless tobacco: Never Used  Substance Use Topics  . Alcohol use: Not Currently  . Drug use: Not Currently     Allergies   Patient has no known allergies.   Review of Systems Review of Systems  10 Systems reviewed and are negative for acute change except as noted in the HPI.  Physical Exam Updated Vital Signs BP 122/73 (BP Location: Right Arm)   Pulse 99   Temp 98.2 F (36.8 C) (Tympanic)   Resp 16   Ht 4\' 10"  (1.473 m)   Wt 51.7 kg   LMP 08/13/2018   SpO2 99%   BMI 23.83 kg/m   Physical Exam  Constitutional:  Patient is petite but well-nourished well-developed.  He is alert.  No respiratory distress.  She does have fair eye contact.  HENT:  Head: Normocephalic and atraumatic.  Mouth/Throat: Oropharynx is clear and moist.  Eyes: EOM are normal.  Neck: Neck supple.  Cardiovascular: Normal rate, regular rhythm, normal heart sounds and intact distal pulses.  Pulmonary/Chest: Effort  normal and breath sounds normal.  Abdominal: Soft. She exhibits no distension. There is no tenderness. There is no guarding.  Musculoskeletal: Normal range of motion. She exhibits no edema or tenderness.  Neurological: She is alert. No cranial nerve deficit. She exhibits normal muscle tone. Coordination normal.  Skin: Skin is warm and dry.  Psychiatric:  Patient has fair eye contact.  Her speech is very tangential and repetitive of subject.  Slightly pressured.     ED Treatments / Results  Labs (all labs ordered are listed, but only abnormal results are displayed) Labs Reviewed  COMPREHENSIVE METABOLIC PANEL - Abnormal; Notable for the following components:      Result Value   Potassium 3.4 (*)    All other components within normal limits  ACETAMINOPHEN LEVEL - Abnormal; Notable for the following components:   Acetaminophen (Tylenol), Serum <10 (*)    All other components within normal limits  CBC WITH DIFFERENTIAL/PLATELET - Abnormal; Notable for the following components:   Hemoglobin 11.5 (*)    All other components within normal limits  ETHANOL  URINALYSIS, ROUTINE W REFLEX MICROSCOPIC  RAPID URINE DRUG SCREEN, HOSP PERFORMED  PREGNANCY, URINE    EKG None  Radiology No results found.  Procedures Procedures (including critical care time)  Medications Ordered in ED Medications  acetaminophen (TYLENOL) tablet 650 mg (has no administration in time range)  ondansetron (ZOFRAN) tablet 4 mg (has no administration in time range)  traZODone (DESYREL) tablet 50 mg (50 mg Oral Given 08/27/18 2303)  ziprasidone (GEODON) injection 20 mg (20 mg Intramuscular Given 08/27/18 2302)     Initial Impression / Assessment and Plan / ED Course  I have reviewed the triage vital signs and the nursing notes.  Pertinent labs & imaging results that were available during my care of the patient were reviewed by me and considered in my medical decision making (see chart for details).      Patient's presentation is consistent with schizophrenia.  She appears to be having active hallucinations and auditory hallucinations.  We will consult TTS for evaluation.  Patient does not show insight into her current situation.  She is actively hallucinating and paranoid.  I feel that she would be a danger to herself outside of the hospital setting at this time.  Do not feel she has capacity or insight at this time for safe decision-making.  IVC paperwork completed.  Patient has been assessed by TTS and meets inpatient criteria.  Final Clinical Impressions(s) / ED Diagnoses   Final diagnoses:  Hallucinations  Delusional disorder (HCC)  Paranoid schizophrenia [facility])    ED Discharge Orders    None       Arby Barrette, MD 08/27/18 2345

## 2018-08-27 NOTE — ED Notes (Signed)
Pt is alert and verbally responsive. Pt denies SI. Pt reports that she has  Auditory hallucinations in which she states she has had since she was a little girl. Pt is from [place] and reports that she has recently moved here.

## 2018-08-28 DIAGNOSIS — F209 Schizophrenia, unspecified: Secondary | ICD-10-CM | POA: Diagnosis not present

## 2018-08-28 DIAGNOSIS — F3170 Bipolar disorder, currently in remission, most recent episode unspecified: Secondary | ICD-10-CM | POA: Diagnosis not present

## 2018-08-28 MED ORDER — TRAZODONE HCL 50 MG PO TABS: 50.0000 mg | ORAL_TABLET | Freq: Every evening | ORAL | Status: DC | PRN

## 2018-08-28 MED ORDER — OLANZAPINE 10 MG PO TABS: 10.0000 mg | ORAL_TABLET | Freq: Two times a day (BID) | ORAL | 0 refills | Status: DC

## 2018-08-28 MED ORDER — TRAZODONE HCL 50 MG PO TABS: 50.0000 mg | ORAL_TABLET | Freq: Every evening | ORAL | 0 refills | Status: DC | PRN

## 2018-08-28 MED ORDER — OLANZAPINE 10 MG PO TABS
10.0000 mg | ORAL_TABLET | Freq: Every day | ORAL | Status: DC
  Administered 2018-08-28: 10 mg via ORAL
  Filled 2018-08-28: qty 1

## 2018-08-28 NOTE — ED Notes (Signed)
Pt has been focused on having the TV changed and on when food is going to be served. She does not appear to be responding to internal stimuli. She is not delusional.

## 2018-08-28 NOTE — ED Notes (Signed)
Pt allowed IM medication to be given without having to put hands on.

## 2018-08-28 NOTE — ED Notes (Signed)
Pt did not want to leave without the hospital providing housing for her. She was given resources by TTS department and this Clinical research associate and a bus pass. She was not satisfied. She was angry at the time of discharge, malingered while changing clothes in the bathroom which required frequent prompting to get her out of the bathroom. When she did leave she was angry and said, "I will never come back to this hospital."

## 2018-08-28 NOTE — Discharge Instructions (Signed)
-  Continue medications as prescribed. -Follow up with your outpatient provider.

## 2018-08-28 NOTE — ED Notes (Signed)
Pt allowed to shower and eat lunch prior to discharge.

## 2018-08-28 NOTE — Consult Note (Signed)
[facility] Psych ED Discharge  08/28/2018 10:53 AM [name] [name]  MRN:  [id number] Principal Problem: Bipolar affective disorder in remission [facility]) Discharge Diagnoses:  Patient Active Problem List   Diagnosis Date Noted  . Bipolar affective disorder, manic, severe, with psychotic behavior (HCC) [F31.2] 06/24/2018  . Bipolar I disorder, current or most recent episode manic, with psychotic features (HCC) [F31.2] 06/24/2018    Subjective:  [name] [name] reports that she is in the hospital because she believes someone was chasing her. She reports that it was [employer]. She also reports having a headache. She denies SI, HI or AVH. She does not appear to be responding to internal stimuli. She reports living alone although per chart review it appears that she is currently living at [facility]. She left housing through [facility] because she felt that it was "dirty." She has been requesting multiple food items and med seeking since admission. She has been noted to demonstrate volitional behaviors for attention seeking.   Total Time spent with patient: 30 minutes  Past Psychiatric History: None per chart review.   Past Medical History:  Past Medical History:  Diagnosis Date  . Arthritis   . Depression    History reviewed. No pertinent surgical history. Family History: History reviewed. No pertinent family history. Family Psychiatric  History:  Social History:  Social History   Substance and Sexual Activity  Alcohol Use Not Currently    Social History   Substance and Sexual Activity  Drug Use Not Currently   Social History   Socioeconomic History  . Marital status: Single    Spouse name: Not on file  . Number of children: Not on file  . Years of education: Not on file  . Highest education level: Not on file  Occupational History  . Not on file  Social Needs  . Financial resource strain: Not on file  . Food insecurity:    Worry: Not on file    Inability: Not on file  . Transportation  needs:    Medical: Not on file    Non-medical: Not on file  Tobacco Use  . Smoking status: Never Smoker  . Smokeless tobacco: Never Used  Substance and Sexual Activity  . Alcohol use: Not Currently  . Drug use: Not Currently  . Sexual activity: Not Currently  Lifestyle  . Physical activity:    Days per week: Not on file    Minutes per session: Not on file  . Stress: Not on file  Relationships  . Social connections:    Talks on phone: Not on file    Gets together: Not on file    Attends religious service: Not on file    Active member of club or organization: Not on file    Attends meetings of clubs or organizations: Not on file    Relationship status: Not on file  Other Topics Concern  . Not on file  Social History Narrative  . Not on file    Has this patient used any form of tobacco in the last 30 days? (Cigarettes, Smokeless Tobacco, Cigars, and/or Pipes) Prescription not provided because: Patient is not a tobacco user.  Current Medications: Current Facility-Administered Medications  Medication Dose Route Frequency Provider Last Rate Last Dose  . acetaminophen (TYLENOL) tablet 650 mg  650 mg Oral Q4H PRN Arby Barrette, MD   650 mg at 08/28/18 1041  . OLANZapine (ZYPREXA) tablet 10 mg  10 mg Oral Daily Cherly Beach, DO   10 mg at 08/28/18  1041  . ondansetron (ZOFRAN) tablet 4 mg  4 mg Oral Q8H PRN Arby Barrette, MD      . traZODone (DESYREL) tablet 50 mg  50 mg Oral QHS PRN Arby Barrette, MD   50 mg at 08/27/18 2303   Current Outpatient Medications  Medication Sig Dispense Refill  . hydrOXYzine (ATARAX/VISTARIL) 10 MG tablet Take 10 mg by mouth 3 (three) times daily as needed for anxiety.     Marland Kitchen OLANZapine (ZYPREXA) 10 MG tablet Take 1 tablet (10 mg total) by mouth 2 (two) times daily. For mood control 60 tablet 0  . traZODone (DESYREL) 50 MG tablet Take 1 tablet (50 mg total) by mouth at bedtime as needed for sleep. 30 tablet 0  . meclizine (ANTIVERT) 25 MG  tablet Take 1 tablet (25 mg total) by mouth 3 (three) times daily as needed for dizziness. (Patient not taking: Reported on 08/27/2018) 30 tablet 0   PTA Medications:  (Not in a hospital admission)  Musculoskeletal: Strength & Muscle Tone: within normal limits Gait & Station: UTA since patient is lying in bed. Patient leans: N/A  Psychiatric Specialty Exam: Physical Exam  Nursing note and vitals reviewed. Constitutional: She is oriented to person, place, and time. She appears well-developed and well-nourished.  HENT:  Head: Normocephalic and atraumatic.  Neck: Normal range of motion.  Respiratory: Effort normal.  Musculoskeletal: Normal range of motion.  Neurological: She is alert and oriented to person, place, and time.  Psychiatric: She has a normal mood and affect. Her speech is normal and behavior is normal. Judgment normal. Cognition and memory are normal.    Review of Systems  Psychiatric/Behavioral: Negative for hallucinations, substance abuse and suicidal ideas.  All other systems reviewed and are negative.   Blood pressure 122/73, pulse 99, temperature 98.2 F (36.8 C), temperature source Tympanic, resp. rate 16, height 4\' 10"  (1.473 m), weight 51.7 kg, last menstrual period 08/13/2018, SpO2 99 %.Body mass index is 23.83 kg/m.  General Appearance: Fairly Groomed, middle aged, African American female, wearing paper hospital scrubs and lying in bed. NAD.   Eye Contact:  Good  Speech:  Clear and Coherent and Normal Rate  Volume:  Normal  Mood:  Euthymic  Affect:  Appropriate and Congruent  Thought Process:  Goal Directed, Linear and Descriptions of Associations: Intact  Orientation:  Full (Time, Place, and Person)  Thought Content:  Logical  Suicidal Thoughts:  No  Homicidal Thoughts:  No  Memory:  Immediate;   Good Recent;   Good Remote;   Good  Judgement:  Fair  Insight:  Fair  Psychomotor Activity:  Normal  Concentration:  Concentration: Good and Attention  Span: Good  Recall:  Good  Fund of Knowledge:  Good  Language:  Good  Akathisia:  No  Handed:  Right  AIMS (if indicated):   N/A  Assets:  Communication Skills Desire for Improvement  ADL's:  Intact  Cognition:  WNL  Sleep:   N/A     Demographic Factors:  Low socioeconomic status and Unemployed  Loss Factors: NA  Historical Factors: Impulsivity  Risk Reduction Factors:   Living with another person, especially a relative  Continued Clinical Symptoms:  Previous Psychiatric Diagnoses and Treatments  Cognitive Features That Contribute To Risk:  None    Suicide Risk:  Minimal: No identifiable suicidal ideation.  Patients presenting with no risk factors but with morbid ruminations; may be classified as minimal risk based on the severity of the depressive symptoms  Follow-up Information  Placey, Chales Abrahams, NP Follow up.   Why:  Please go see Chales Abrahams at the [facility].  She can help with all kinds of things and become your primary doctor. Contact information: [address] [place] [address] [phone]          Assessment:  [name] [name] is a 42 y.o. female who was admitted with altered mental status and delusional thoughts. She received behavioral medications overnight. She was restarted on her home medication. She has been appropriate in behavior and thought process on the unit. She does not appear to be responding to internal stimuli. She appears to be malingering for food and shelter. She has been requesting multiple food items and quietly watching television in bed. She does not warrant inpatient psychiatric hospitalization at this time.   Plan Of Care/Follow-up recommendations:  -Restarted Zyprexa 10 mg daily for bipolar disorder.  -Patient should follow up with outpatient provider for medication management.   Disposition: Discharge home Cherly Beach, DO 08/28/2018, 10:53 AM

## 2018-08-29 ENCOUNTER — Inpatient Hospital Stay: Payer: Self-pay | Admitting: Family Medicine

## 2018-08-31 ENCOUNTER — Emergency Department (HOSPITAL_COMMUNITY)
Admission: EM | Admit: 2018-08-31 | Discharge: 2018-09-02 | Disposition: A | Discharge status: Other Acute Inpatient Hospital | Payer: Medicare Other | Attending: Emergency Medicine | Admitting: Emergency Medicine

## 2018-08-31 ENCOUNTER — Encounter (HOSPITAL_COMMUNITY): Payer: Self-pay | Admitting: Emergency Medicine

## 2018-08-31 ENCOUNTER — Other Ambulatory Visit: Payer: Self-pay

## 2018-08-31 DIAGNOSIS — F419 Anxiety disorder, unspecified: Secondary | ICD-10-CM | POA: Diagnosis not present

## 2018-08-31 DIAGNOSIS — R442 Other hallucinations: Secondary | ICD-10-CM | POA: Diagnosis not present

## 2018-08-31 DIAGNOSIS — R440 Auditory hallucinations: Secondary | ICD-10-CM | POA: Insufficient documentation

## 2018-08-31 DIAGNOSIS — G4700 Insomnia, unspecified: Secondary | ICD-10-CM | POA: Diagnosis not present

## 2018-08-31 DIAGNOSIS — Z79899 Other long term (current) drug therapy: Secondary | ICD-10-CM | POA: Insufficient documentation

## 2018-08-31 DIAGNOSIS — F319 Bipolar disorder, unspecified: Secondary | ICD-10-CM | POA: Insufficient documentation

## 2018-08-31 DIAGNOSIS — Z79891 Long term (current) use of opiate analgesic: Secondary | ICD-10-CM | POA: Diagnosis not present

## 2018-08-31 DIAGNOSIS — F250 Schizoaffective disorder, bipolar type: Secondary | ICD-10-CM | POA: Diagnosis present

## 2018-08-31 DIAGNOSIS — F209 Schizophrenia, unspecified: Secondary | ICD-10-CM | POA: Insufficient documentation

## 2018-08-31 DIAGNOSIS — R451 Restlessness and agitation: Secondary | ICD-10-CM | POA: Diagnosis not present

## 2018-08-31 LAB — POC URINE PREG, ED: Preg Test, Ur: NEGATIVE

## 2018-08-31 LAB — CBC
HCT: 37.4 % (ref 36.0–46.0)
Hemoglobin: 11.7 g/dL — ABNORMAL LOW (ref 12.0–15.0)
MCH: 27.7 pg (ref 26.0–34.0)
MCHC: 31.3 g/dL (ref 30.0–36.0)
MCV: 88.4 fL (ref 80.0–100.0)
Platelets: 345 10*3/uL (ref 150–400)
RBC: 4.23 MIL/uL (ref 3.87–5.11)
RDW: 13.9 % (ref 11.5–15.5)
WBC: 3.9 10*3/uL — ABNORMAL LOW (ref 4.0–10.5)
nRBC: 0 % (ref 0.0–0.2)

## 2018-08-31 LAB — COMPREHENSIVE METABOLIC PANEL
ALT: 16 U/L (ref 0–44)
AST: 22 U/L (ref 15–41)
Albumin: 4.4 g/dL (ref 3.5–5.0)
Alkaline Phosphatase: 48 U/L (ref 38–126)
Anion gap: 10 (ref 5–15)
BUN: 14 mg/dL (ref 6–20)
CO2: 23 mmol/L (ref 22–32)
Calcium: 9.5 mg/dL (ref 8.9–10.3)
Chloride: 107 mmol/L (ref 98–111)
Creatinine, Ser: 0.55 mg/dL (ref 0.44–1.00)
GFR calc Af Amer: >60 mL/min (ref >60)
GFR calc non Af Amer: >60 mL/min (ref >60)
Glucose, Bld: 79 mg/dL (ref 70–99)
Potassium: 3.7 mmol/L (ref 3.5–5.1)
Sodium: 140 mmol/L (ref 135–145)
Total Bilirubin: 0.5 mg/dL (ref 0.3–1.2)
Total Protein: 7.6 g/dL (ref 6.5–8.1)

## 2018-08-31 LAB — SALICYLATE LEVEL: Salicylate Lvl: <7 mg/dL (ref 2.8–30.0)

## 2018-08-31 LAB — RAPID URINE DRUG SCREEN, HOSP PERFORMED
Amphetamines: NOT DETECTED
Barbiturates: NOT DETECTED
Benzodiazepines: NOT DETECTED
Cocaine: NOT DETECTED
Opiates: NOT DETECTED
Tetrahydrocannabinol: NOT DETECTED

## 2018-08-31 LAB — ETHANOL: Alcohol, Ethyl (B): <10 mg/dL (ref <10)

## 2018-08-31 LAB — ACETAMINOPHEN LEVEL: Acetaminophen (Tylenol), Serum: <10 ug/mL — ABNORMAL LOW (ref 10–30)

## 2018-08-31 MED ORDER — TRAZODONE HCL 50 MG PO TABS
50.0000 mg | ORAL_TABLET | Freq: Every evening | ORAL | Status: DC | PRN
  Administered 2018-08-31: 50 mg via ORAL
  Filled 2018-08-31: qty 1

## 2018-08-31 MED ORDER — OLANZAPINE 10 MG PO TABS
10.0000 mg | ORAL_TABLET | Freq: Two times a day (BID) | ORAL | Status: DC
  Filled 2018-08-31: qty 1

## 2018-08-31 NOTE — ED Provider Notes (Signed)
[facility]-EMERGENCY DEPT Provider Note   CSN: [id number] Arrival date & time: 08/31/18  1835     History   Chief Complaint Chief Complaint  Patient presents with  . Hallucinations  . Delusional  . Homeless    HPI [name] [name] is a 42 y.o. female.  The history is provided by the patient and medical records. No language interpreter was used.   [name] [name] is a 42 y.o. female with history of bipolar disorder who presents to the emergency department complaining of auditory hallucinations and feeling as if someone is out to get her.  She denies any suicidal thoughts or homicidal thoughts.  She reports that Jesus is going to come back and kill her.  She denies taking any home medications, however per chart review, she should be on Zyprexa and trazodone.  Denies any other complaints.  Past Medical History:  Diagnosis Date  . Arthritis   . Depression     Patient Active Problem List   Diagnosis Date Noted  . Bipolar affective disorder in remission (HCC)   . Bipolar affective disorder, manic, severe, with psychotic behavior (HCC) 06/24/2018  . Bipolar I disorder, current or most recent episode manic, with psychotic features (HCC) 06/24/2018    History reviewed. No pertinent surgical history.   OB History   None      Home Medications    Prior to Admission medications   Medication Sig Start Date End Date Taking? Authorizing Provider  OLANZapine (ZYPREXA) 10 MG tablet Take 1 tablet (10 mg total) by mouth 2 (two) times daily. 08/28/18  Yes Cherly Beach, DO  traZODone (DESYREL) 50 MG tablet Take 1 tablet (50 mg total) by mouth at bedtime as needed for sleep. 06/28/18  Yes Armandina Stammer I, NP  OLANZapine (ZYPREXA) 10 MG tablet Take 1 tablet (10 mg total) by mouth 2 (two) times daily. For mood control Patient not taking: Reported on 08/31/2018 06/28/18   Armandina Stammer I, NP  traZODone (DESYREL) 50 MG tablet Take 1 tablet (50 mg total) by mouth at  bedtime as needed for sleep. Patient not taking: Reported on 08/31/2018 08/28/18   Cherly Beach, DO    Family History No family history on file.  Social History Social History   Tobacco Use  . Smoking status: Never Smoker  . Smokeless tobacco: Never Used  Substance Use Topics  . Alcohol use: Not Currently  . Drug use: Not Currently     Allergies   Patient has no known allergies.   Review of Systems Review of Systems  Psychiatric/Behavioral: Positive for hallucinations.  All other systems reviewed and are negative.    Physical Exam Updated Vital Signs BP 120/82 (BP Location: Right Arm)   Pulse 78   Temp 97.9 F (36.6 C) (Oral)   Resp 18   Ht 4\' 10"  (1.473 m)   Wt 51.7 kg   LMP 08/13/2018   SpO2 99%   BMI 23.83 kg/m   Physical Exam  Constitutional: She is oriented to person, place, and time. She appears well-developed and well-nourished. No distress.  HENT:  Head: Normocephalic and atraumatic.  Neck: Neck supple.  Cardiovascular: Normal rate, regular rhythm and normal heart sounds.  No murmur heard. Pulmonary/Chest: Effort normal and breath sounds normal. No respiratory distress.  Abdominal: Soft. She exhibits no distension. There is no tenderness.  Neurological: She is alert and oriented to person, place, and time.  Skin: Skin is warm and dry.  Nursing note and vitals reviewed.  ED Treatments / Results  Labs (all labs ordered are listed, but only abnormal results are displayed) Labs Reviewed  ACETAMINOPHEN LEVEL - Abnormal; Notable for the following components:      Result Value   Acetaminophen (Tylenol), Serum <10 (*)    All other components within normal limits  CBC - Abnormal; Notable for the following components:   WBC 3.9 (*)    Hemoglobin 11.7 (*)    All other components within normal limits  COMPREHENSIVE METABOLIC PANEL  ETHANOL  SALICYLATE LEVEL  RAPID URINE DRUG SCREEN, HOSP PERFORMED  I-STAT BETA HCG BLOOD, ED ([facility], [facility], [facility]  ONLY)  POC URINE PREG, ED    EKG None  Radiology No results found.  Procedures Procedures (including critical care time)  Medications Ordered in ED Medications - No data to display   Initial Impression / Assessment and Plan / ED Course  I have reviewed the triage vital signs and the nursing notes.  Pertinent labs & imaging results that were available during my care of the patient were reviewed by me and considered in my medical decision making (see chart for details).    [name] [name] is a 42 y.o. female who presents to ED for hallucinations.  Afebrile, hemodynamically stable.  Labs reviewed and reassuring.  Medically cleared.  TTS recommend inpatient treatment.    Final Clinical Impressions(s) / ED Diagnoses   Final diagnoses:  Auditory hallucinations    ED Discharge Orders    None       Ward, Chase Picket, PA-C 08/31/18 2220    Raeford Razor, MD 09/01/18 1546

## 2018-08-31 NOTE — ED Triage Notes (Signed)
Per [facility] pt from shelter for hallucinations. Reports that Jesus is telling her he is coming to kill her. Pt denies SI. Was seen here on the 18th for similar hallucinations.

## 2018-08-31 NOTE — ED Notes (Signed)
Pt denies A/V hallucinations, SI/HI.

## 2018-08-31 NOTE — ED Notes (Signed)
Bed: [facility]42 Expected date:  Expected time:  Means of arrival:  Comments: 

## 2018-08-31 NOTE — ED Notes (Signed)
Pt alert, and calm. Pt denies any pain, pt denies any si, hi. Pt states she's seeing hearing stuff that's not there. Pt contract to safety, will continue to monitor.

## 2018-08-31 NOTE — BH Assessment (Addendum)
Assessment Note  [name] [name] is an 42 y.o. female who presents to the ED voluntarily. Pt reports she was at the shelter at [facility] when she began to feel paranoid that someone was after her. TTS asked the pt who she felt was after her and she stated "[name]." Pt has a hx of presenting to the ED with the same delusions that [name] is trying to kill her. Pt moved from [place] to [place] 2 months ago and has been living in various shelters. Pt was recently evaluated in [facility] and states she was given OPT resources to follow up with [facility]. Pt states she did not follow up as advised because she does not feel like she needs help. Pt states she has not taken her medication that she is prescribed because she does not feel that she needs to take it. Pt denies SI, HI, and AVH.   Pt states she does not want to go to [facility] because "this is really happening and it's not a mental health issue." Pt has been assessed by TTS on multiple occasions including 08/27/18, 07/26/18, 07/22/18, and 06/23/18 due to delusions and paranoid ideations. Pt reports she has attempted suicide in the past by OD on medication and running into the street. Pt denies current SI. Pt denies other complaints.   TTS consulted with Donell Sievert, PA who recommends inpt treatment. TTS to seek placement. ED staff Reagan, Vivien Rota, RN and Dr. Juleen China, MD have been advised.  Diagnosis: Schizophrenia  Past Medical History:  Past Medical History:  Diagnosis Date  . Arthritis   . Depression     History reviewed. No pertinent surgical history.  Family History: No family history on file.  Social History:  reports that she has never smoked. She has never used smokeless tobacco. She reports that she drank alcohol. She reports that she has current or past drug history.  Additional Social History:  Alcohol / Drug Use Pain Medications: see MAR Prescriptions: see MAR Over the Counter: see MAR History of alcohol / drug use?: No history  of alcohol / drug abuse  CIWA: CIWA-Ar BP: 120/82 Pulse Rate: 78 COWS:    Allergies: No Known Allergies  Home Medications:  (Not in a hospital admission)  OB/GYN Status:  Patient's last menstrual period was 08/13/2018.  General Assessment Data Location of Assessment: [facility] TTS Assessment: In system Is this a Tele or Face-to-Face Assessment?: Face-to-Face Is this an Initial Assessment or a Re-assessment for this encounter?: Initial Assessment Patient Accompanied by:: (alone) Language Other than English: No Living Arrangements: Homeless/Shelter What gender do you identify as?: Female Marital status: Single Pregnancy Status: No Can pt return to current living arrangement?: Yes Admission Status: Voluntary Is patient capable of signing voluntary admission?: Yes Referral Source: Self/Family/Friend Insurance type: [facility]     Crisis Care Plan Name of Psychiatrist: [facility] Name of Therapist: [facility]  Education Status Is patient currently in school?: No Is the patient employed, unemployed or receiving disability?: Receiving disability income  Risk to self with the past 6 months Suicidal Ideation: No Has patient been a risk to self within the past 6 months prior to admission? : No Suicidal Intent: No Has patient had any suicidal intent within the past 6 months prior to admission? : No Is patient at risk for suicide?: No Suicidal Plan?: No Has patient had any suicidal plan within the past 6 months prior to admission? : No Access to Means: No What has been your use of drugs/alcohol  within the last 12 months?: denies Previous Attempts/Gestures: Yes How many times?: 2 Other Self Harm Risks: hx of suicide attempts, ongoing delusions Triggers for Past Attempts: Unpredictable Intentional Self Injurious Behavior: Bruising Comment - Self Injurious Behavior: hitting self Family Suicide History: No Recent stressful life event(s): Other (Comment)(delusions) Persecutory  voices/beliefs?: Yes Depression: Yes Depression Symptoms: Insomnia, Feeling worthless/self pity Substance abuse history and/or treatment for substance abuse?: No Suicide prevention information given to non-admitted patients: Not applicable  Risk to Others within the past 6 months Homicidal Ideation: No Does patient have any lifetime risk of violence toward others beyond the six months prior to admission? : No Thoughts of Harm to Others: No Current Homicidal Intent: No Current Homicidal Plan: No Access to Homicidal Means: No History of harm to others?: No Assessment of Violence: None Noted Does patient have access to weapons?: No Criminal Charges Pending?: Yes Describe Pending Criminal Charges: misuse of 911 Does patient have a court date: Yes Court Date: 09/27/18 Is patient on probation?: No  Psychosis Hallucinations: None noted Delusions: Unspecified  Mental Status Report Appearance/Hygiene: Unremarkable, In scrubs Eye Contact: Good Motor Activity: Freedom of movement Speech: Soft Level of Consciousness: Quiet/awake Mood: Anxious, Preoccupied Affect: Anxious Anxiety Level: Severe Thought Processes: Flight of Ideas Judgement: Impaired Orientation: Person, Place, Time Obsessive Compulsive Thoughts/Behaviors: None  Cognitive Functioning Concentration: Normal Memory: Remote Intact, Recent Intact Is patient IDD: No Insight: Fair Impulse Control: Fair Appetite: Good Have you had any weight changes? : No Change Sleep: Decreased Total Hours of Sleep: 4 Vegetative Symptoms: None  ADLScreening [facility] Assessment Services) Patient's cognitive ability adequate to safely complete daily activities?: Yes Patient able to express need for assistance with ADLs?: Yes Independently performs ADLs?: Yes (appropriate for developmental age)  Prior Inpatient Therapy Prior Inpatient Therapy: Yes Prior Therapy Dates: 2019, 2018 Prior Therapy Facilty/Provider(s): [facility], facilities in  [place] Reason for Treatment: Psychosis  Prior Outpatient Therapy Prior Outpatient Therapy: Yes Prior Therapy Dates: 2019 Prior Therapy Facilty/Provider(s): [facility] Reason for Treatment: psychosis Does patient have an ACCT team?: No Does patient have Intensive In-House Services?  : No Does patient have [facility] services? : Yes Does patient have P4CC services?: No  ADL Screening (condition at time of admission) Patient's cognitive ability adequate to safely complete daily activities?: Yes Is the patient deaf or have difficulty hearing?: No Does the patient have difficulty seeing, even when wearing glasses/contacts?: No Does the patient have difficulty concentrating, remembering, or making decisions?: Yes Patient able to express need for assistance with ADLs?: Yes Does the patient have difficulty dressing or bathing?: No Independently performs ADLs?: Yes (appropriate for developmental age) Does the patient have difficulty walking or climbing stairs?: No Weakness of Legs: None Weakness of Arms/Hands: None  Home Assistive Devices/Equipment Home Assistive Devices/Equipment: None    Abuse/Neglect Assessment (Assessment to be complete while patient is alone) Abuse/Neglect Assessment Can Be Completed: Yes Physical Abuse: Denies Verbal Abuse: Yes, past (Comment)(childhood) Sexual Abuse: Denies Exploitation of patient/patient's resources: Denies Self-Neglect: Denies     Merchant navy officer (For Healthcare) Does Patient Have a Medical Advance Directive?: No Would patient like information on creating a medical advance directive?: No - Patient declined          Disposition: TTS consulted with Donell Sievert, PA who recommends inpt treatment. TTS to seek placement. ED staff Reagan, Vivien Rota, RN and Dr. Juleen China, MD have been advised.  Disposition Initial Assessment Completed for this Encounter: Yes Disposition of Patient: Admit Type of inpatient treatment program: Adult Patient refused  recommended treatment: No  On Site Evaluation by:   Reviewed with Physician:    [name] 08/31/2018 8:18 PM

## 2018-08-31 NOTE — ED Notes (Signed)
I-stat beta not completed, marked by mistake.

## 2018-09-01 DIAGNOSIS — F250 Schizoaffective disorder, bipolar type: Secondary | ICD-10-CM | POA: Diagnosis not present

## 2018-09-01 DIAGNOSIS — F419 Anxiety disorder, unspecified: Secondary | ICD-10-CM | POA: Diagnosis not present

## 2018-09-01 MED ORDER — DIPHENHYDRAMINE HCL 50 MG/ML IJ SOLN
50.0000 mg | Freq: Once | INTRAMUSCULAR | Status: AC
  Administered 2018-09-01: 50 mg via INTRAMUSCULAR
  Filled 2018-09-01: qty 1

## 2018-09-01 MED ORDER — ZIPRASIDONE MESYLATE 20 MG IM SOLR
20.0000 mg | Freq: Once | INTRAMUSCULAR | Status: AC
  Administered 2018-09-01: 20 mg via INTRAMUSCULAR
  Filled 2018-09-01: qty 20

## 2018-09-01 MED ORDER — OLANZAPINE 5 MG PO TABS
5.0000 mg | ORAL_TABLET | Freq: Two times a day (BID) | ORAL | Status: DC | PRN
  Administered 2018-09-01 – 2018-09-02 (×2): 5 mg via ORAL
  Filled 2018-09-01 (×2): qty 1

## 2018-09-01 MED ORDER — HALOPERIDOL 5 MG PO TABS
5.0000 mg | ORAL_TABLET | Freq: Two times a day (BID) | ORAL | Status: DC
  Filled 2018-09-01: qty 1

## 2018-09-01 MED ORDER — LORAZEPAM 2 MG/ML IJ SOLN
2.0000 mg | Freq: Once | INTRAMUSCULAR | Status: AC
  Administered 2018-09-01: 2 mg via INTRAMUSCULAR
  Filled 2018-09-01: qty 1

## 2018-09-01 MED ORDER — STERILE WATER FOR INJECTION IJ SOLN
INTRAMUSCULAR | Status: AC
  Administered 2018-09-01: 12:00:00
  Filled 2018-09-01: qty 10

## 2018-09-01 NOTE — ED Notes (Signed)
Hands on restraint to administer IM medication in pt room as ordered by MD. [employer], security, nursing staff present. After medication administered, Pt then ran out of room, yelling, screaming, behavior not redirectable. Pt not open to communication with anyone. Pt then became physically aggressive, hitting, kicking, biting and spitting. Verbally aggressive, verbally threatening . Pt escorted to seclusion room by security and [employer].

## 2018-09-01 NOTE — Progress Notes (Addendum)
This patient continues to meet inpatient criteria. CSW fax information to the following facilities:   [facility] - PT on waitlist [facility] [place] [facility] - Under review [facility]- to be reviewed [facility]- has beds, will be reviewed [facility] [place]  Enid Cutter, [place] Clinical Social Worker [phone]
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
## 2018-09-01 NOTE — ED Notes (Signed)
Patient denies SI/HI/AVH at this time. Patient is calm and cooperative at this time. Encouragement and support provided and safety maintain. Q 15 min safety checks remain in place and video monitoring.

## 2018-09-01 NOTE — ED Notes (Signed)
Pt taking a shower 

## 2018-09-01 NOTE — ED Notes (Signed)
Pt talking on hallway phone.  

## 2018-09-01 NOTE — ED Notes (Signed)
Pt observed talking to self in room, forwards little with this nurse, refusing ordered PO medication. Encouragement and support provided. Special checks q 15 mins in place for safety, Video monitoring in place. Will continue to monitor.

## 2018-09-01 NOTE — ED Notes (Signed)
Pt behavior becomingly increasingly agitated, Pt behavior escalating, yelling, screaming, talking to self. Dr. Mervyn Skeeters made aware.

## 2018-09-02 ENCOUNTER — Inpatient Hospital Stay (HOSPITAL_COMMUNITY)
Admission: AD | Admit: 2018-09-02 | Discharge: 2018-09-06 | DRG: 885 | Disposition: A | Discharge status: Home / Self Care | Payer: Medicare Other | Attending: Internal Medicine | Admitting: Internal Medicine

## 2018-09-02 ENCOUNTER — Encounter (HOSPITAL_COMMUNITY): Payer: Self-pay

## 2018-09-02 ENCOUNTER — Other Ambulatory Visit: Payer: Self-pay

## 2018-09-02 DIAGNOSIS — R451 Restlessness and agitation: Secondary | ICD-10-CM | POA: Diagnosis present

## 2018-09-02 DIAGNOSIS — F419 Anxiety disorder, unspecified: Secondary | ICD-10-CM | POA: Diagnosis present

## 2018-09-02 DIAGNOSIS — Z79891 Long term (current) use of opiate analgesic: Secondary | ICD-10-CM | POA: Diagnosis not present

## 2018-09-02 DIAGNOSIS — Z79899 Other long term (current) drug therapy: Secondary | ICD-10-CM

## 2018-09-02 DIAGNOSIS — G4700 Insomnia, unspecified: Secondary | ICD-10-CM | POA: Diagnosis present

## 2018-09-02 DIAGNOSIS — F250 Schizoaffective disorder, bipolar type: Secondary | ICD-10-CM | POA: Diagnosis present

## 2018-09-02 MED ORDER — ALUM & MAG HYDROXIDE-SIMETH 200-200-20 MG/5ML PO SUSP
30.0000 mL | ORAL | Status: DC | PRN
  Administered 2018-09-06: 30 mL via ORAL
  Filled 2018-09-02: qty 30

## 2018-09-02 MED ORDER — ACETAMINOPHEN 325 MG PO TABS
650.0000 mg | ORAL_TABLET | Freq: Four times a day (QID) | ORAL | Status: DC | PRN
  Administered 2018-09-05: 650 mg via ORAL
  Filled 2018-09-02: qty 2

## 2018-09-02 MED ORDER — OLANZAPINE 5 MG PO TABS
5.0000 mg | ORAL_TABLET | Freq: Two times a day (BID) | ORAL | Status: DC | PRN
  Administered 2018-09-02: 5 mg via ORAL
  Filled 2018-09-02: qty 2

## 2018-09-02 MED ORDER — MAGNESIUM HYDROXIDE 400 MG/5ML PO SUSP: 30.0000 mL | Freq: Every day | ORAL | Status: DC | PRN

## 2018-09-02 MED ORDER — TRAZODONE HCL 50 MG PO TABS
50.0000 mg | ORAL_TABLET | Freq: Every evening | ORAL | Status: DC | PRN
  Administered 2018-09-02 – 2018-09-05 (×4): 50 mg via ORAL
  Filled 2018-09-02 (×4): qty 1
  Filled 2018-09-02: qty 7

## 2018-09-02 MED ORDER — HALOPERIDOL 5 MG PO TABS
5.0000 mg | ORAL_TABLET | Freq: Two times a day (BID) | ORAL | Status: DC
  Filled 2018-09-02: qty 1

## 2018-09-02 NOTE — ED Notes (Signed)
Denies SI/HI/AVH. Pt notes, "I stay in the shelter, I was a little upset. When I am upset, I tend to talk to myself."

## 2018-09-02 NOTE — ED Notes (Signed)
Patient denies pain and is resting comfortably.  

## 2018-09-02 NOTE — ED Notes (Signed)
Patient transported to [facility], by [place] [employer], for continuation of specialized care. Belongings given to deputy. Pt left in no acute distress. 

## 2018-09-02 NOTE — Progress Notes (Signed)
After being brought to the unit and talking to [place] , pt went to her room and used the bathroom, pt was more receptive to Clinical research associate after that point and more cooperative. Pt signed her paperwork and answered questions for her admission.

## 2018-09-02 NOTE — ED Notes (Signed)
Pt refused haldol

## 2018-09-02 NOTE — ED Notes (Signed)
Patient denies SI/HI/AVH at this time. Plan of care discussed. Encouragement and support provided and safety maintain. Q 15 min safety checks remain in place and video monitoring. 

## 2018-09-02 NOTE — ED Notes (Signed)
[employer] called for transport. °

## 2018-09-02 NOTE — BH Assessment (Signed)
On approaching pt to do the admission, pt began getting loud stating she only came over here to get an assessment and she could leave. Pt would not listen to Chiropractor of making false accusations about patient. AC- Marvia Pickles called to talk with pt. Pt was more agreeable with other staff. Pt continues to present hostile and verbally aggressive towards Clinical research associate.

## 2018-09-02 NOTE — Progress Notes (Signed)
ntake and Assessment from [facility] called again today to advise they are reviewing the patient.  [name] [name]. Kaylyn Lim, MSW, LCSWA Disposition Clinical Social Work [phone] (cell) [phone] (office)

## 2018-09-02 NOTE — BH Assessment (Addendum)
[facility] Assessment Progress Note  Per Juanetta Beets, DO, this pt requires psychiatric hospitalization.  [name], RN, [facility] has pre-assigned pt to [facility] Rm [id number] in anticipation of a discharge scheduled for later today; she will call when [facility] is ready to receive pt.  Pt presents under IVC initiated by Thedore Mins, MD, and IVC documents have been faxed to [facility].  Pt's nurse, Angelique Blonder, has been notified, and agrees to call report to [phone].  Pt is to be transported via [employer].   Doylene Canning, [place] Behavioral Health Coordinator [phone]   Addendum:  Per Richelle Ito, [facility] will be ready to receive pt at 20:00.  Pt's nurse has been notified.  Doylene Canning, [place] Behavioral Health Coordinator [phone]

## 2018-09-02 NOTE — Consult Note (Addendum)
[facility] Consult   Reason for Consult:  Psychosis  Referring Physician:  EDP Patient Identification: Terrin Imparato MRN:  [id number] Principal Diagnosis: Schizoaffective disorder, bipolar type [facility]) Diagnosis:   Patient Active Problem List   Diagnosis Date Noted  . Schizoaffective disorder, bipolar type ([place]) [F25.0] 09/01/2018    Priority: High    Total Time spent with patient: 30 minutes  Subjective:   Janyra Barillas is a 42 y.o. female patient admitted with pmhx of bipolar.   HPI:  Ms. Wrightsman is a 42 year old female with history of bipolar who presents to the ED 08/31/2018 for evaluation of auditory hallucinations.  On assessment today, she has improved but not a her baseline.  Continues to talk to people in her room, much calmer.  She did sleep some last night and taking her medications.  Still delusional about people being after her. Psychosis started a few weeks ago and increased with stressors, decreased with medications. Transferred to inpatient.  Past Psychiatric History: schizoaffective disorder  Risk to Self: Suicidal Ideation: No Suicidal Intent: No Is patient at risk for suicide?: No Suicidal Plan?: No Access to Means: No What has been your use of drugs/alcohol within the last 12 months?: denies How many times?: 2 Other Self Harm Risks: hx of suicide attempts, ongoing delusions Triggers for Past Attempts: Unpredictable Intentional Self Injurious Behavior: Bruising Comment - Self Injurious Behavior: hitting self Risk to Others: Homicidal Ideation: No Thoughts of Harm to Others: No Current Homicidal Intent: No Current Homicidal Plan: No Access to Homicidal Means: No History of harm to others?: No Assessment of Violence: None Noted Does patient have access to weapons?: No Criminal Charges Pending?: Yes Describe Pending Criminal Charges: misuse of 911 Does patient have a court date: Yes Court Date: 09/27/18 Prior Inpatient Therapy: Prior Inpatient  Therapy: Yes Prior Therapy Dates: 2019, 2018 Prior Therapy Facilty/Provider(s): [facility], facilities in [place] Reason for Treatment: Psychosis Prior Outpatient Therapy: Prior Outpatient Therapy: Yes Prior Therapy Dates: 2019 Prior Therapy Facilty/Provider(s): [place] Reason for Treatment: psychosis Does patient have an ACCT team?: No Does patient have Intensive In-House Services?  : No Does patient have [facility] services? : Yes Does patient have P4CC services?: No  Past Medical History:  Past Medical History:  Diagnosis Date  . Arthritis   . Depression    History reviewed. No pertinent surgical history. Family History: No family history on file. Family Psychiatric  History: none Social History:  Social History   Substance and Sexual Activity  Alcohol Use Not Currently     Social History   Substance and Sexual Activity  Drug Use Not Currently    Social History   Socioeconomic History  . Marital status: Single    Spouse name: Not on file  . Number of children: Not on file  . Years of education: Not on file  . Highest education level: Not on file  Occupational History  . Not on file  Social Needs  . Financial resource strain: Not on file  . Food insecurity:    Worry: Not on file    Inability: Not on file  . Transportation needs:    Medical: Not on file    Non-medical: Not on file  Tobacco Use  . Smoking status: Never Smoker  . Smokeless tobacco: Never Used  Substance and Sexual Activity  . Alcohol use: Not Currently  . Drug use: Not Currently  . Sexual activity: Not Currently  Lifestyle  . Physical activity:    Days per week: Not  on file    Minutes per session: Not on file  . Stress: Not on file  Relationships  . Social connections:    Talks on phone: Not on file    Gets together: Not on file    Attends religious service: Not on file    Active member of club or organization: Not on file    Attends meetings of clubs or organizations: Not on file    Relationship  status: Not on file  Other Topics Concern  . Not on file  Social History Narrative  . Not on file   Additional Social History: N/A    Allergies:  No Known Allergies  Labs:  Results for orders placed or performed during the hospital encounter of 08/31/18 (from the past 48 hour(s))  Comprehensive metabolic panel     Status: None   Collection Time: 08/31/18  6:52 PM  Result Value Ref Range   Sodium 140 135 - 145 mmol/L   Potassium 3.7 3.5 - 5.1 mmol/L   Chloride 107 98 - 111 mmol/L   CO2 23 22 - 32 mmol/L   Glucose, Bld 79 70 - 99 mg/dL   BUN 14 6 - 20 mg/dL   Creatinine, Ser 0.55 0.44 - 1.00 mg/dL   Calcium 9.5 8.9 - 10.3 mg/dL   Total Protein 7.6 6.5 - 8.1 g/dL   Albumin 4.4 3.5 - 5.0 g/dL   AST 22 15 - 41 U/L   ALT 16 0 - 44 U/L   Alkaline Phosphatase 48 38 - 126 U/L   Total Bilirubin 0.5 0.3 - 1.2 mg/dL   GFR calc non Af Amer >60 >60 mL/min   GFR calc Af Amer >60 >60 mL/min    Comment: (NOTE) The eGFR has been calculated using the CKD EPI equation. This calculation has not been validated in all clinical situations. eGFR's persistently <60 mL/min signify possible Chronic Kidney Disease.    Anion gap 10 5 - 15    Comment: Performed at [facility], [place] [address] [place], [place] [address]  Ethanol     Status: None   Collection Time: 08/31/18  6:52 PM  Result Value Ref Range   Alcohol, Ethyl (B) <10 <10 mg/dL    Comment: (NOTE) Lowest detectable limit for serum alcohol is 10 mg/dL. For medical purposes only. Performed at [facility], [place] [address] [place], [place] [address]   Salicylate level     Status: None   Collection Time: 08/31/18  6:52 PM  Result Value Ref Range   Salicylate Lvl <7.6 2.8 - 30.0 mg/dL    Comment: Performed at [facility], [place] [address] [place], [place] [address]  Acetaminophen level     Status: Abnormal   Collection Time: 08/31/18  6:52 PM  Result Value Ref Range    Acetaminophen (Tylenol), Serum <10 (L) 10 - 30 ug/mL    Comment: (NOTE) Therapeutic concentrations vary significantly. A range of 10-30 ug/mL  may be an effective concentration for many patients. However, some  are best treated at concentrations outside of this range. Acetaminophen concentrations >150 ug/mL at 4 hours after ingestion  and >50 ug/mL at 12 hours after ingestion are often associated with  toxic reactions. Performed at [facility], [place] [address], [place], [place] [address]   cbc     Status: Abnormal   Collection Time: 08/31/18  6:52 PM  Result Value Ref Range   WBC 3.9 (L) 4.0 - 10.5 K/uL  RBC 4.23 3.87 - 5.11 MIL/uL   Hemoglobin 11.7 (L) 12.0 - 15.0 g/dL   HCT 37.4 36.0 - 46.0 %   MCV 88.4 80.0 - 100.0 fL   MCH 27.7 26.0 - 34.0 pg   MCHC 31.3 30.0 - 36.0 g/dL   RDW 13.9 11.5 - 15.5 %   Platelets 345 150 - 400 K/uL   nRBC 0.0 0.0 - 0.2 %    Comment: Performed at [facility], [place] [address] [place], [place] [address]  Rapid urine drug screen (hospital performed)     Status: None   Collection Time: 08/31/18  7:26 PM  Result Value Ref Range   Opiates NONE DETECTED NONE DETECTED   Cocaine NONE DETECTED NONE DETECTED   Benzodiazepines NONE DETECTED NONE DETECTED   Amphetamines NONE DETECTED NONE DETECTED   Tetrahydrocannabinol NONE DETECTED NONE DETECTED   Barbiturates NONE DETECTED NONE DETECTED    Comment: (NOTE) DRUG SCREEN FOR MEDICAL PURPOSES ONLY.  IF CONFIRMATION IS NEEDED FOR ANY PURPOSE, NOTIFY LAB WITHIN 5 DAYS. LOWEST DETECTABLE LIMITS FOR URINE DRUG SCREEN Drug Class                     Cutoff (ng/mL) Amphetamine and metabolites    1000 Barbiturate and metabolites    200 Benzodiazepine                 272 Tricyclics and metabolites     300 Opiates and metabolites        300 Cocaine and metabolites        300 THC                            50 Performed at [facility], [place] [address] [place],  [address]   POC Urine Pregnancy, ED (do NOT order at [facility])     Status: None   Collection Time: 08/31/18  7:33 PM  Result Value Ref Range   Preg Test, Ur NEGATIVE NEGATIVE    Comment:        THE SENSITIVITY OF THIS METHODOLOGY IS >24 mIU/mL     Current Facility-Administered Medications  Medication Dose Route Frequency Provider Last Rate Last Dose  . OLANZapine (ZYPREXA) tablet 5 mg  5 mg Oral BID PRN Laverle Hobby, PA-C   5 mg at 09/02/18 0819  . traZODone (DESYREL) tablet 50 mg  50 mg Oral QHS PRN Ward, Ozella Almond, PA-C   50 mg at 08/31/18 2325   Current Outpatient Medications  Medication Sig Dispense Refill  . OLANZapine (ZYPREXA) 10 MG tablet Take 1 tablet (10 mg total) by mouth 2 (two) times daily. 7 tablet 0  . traZODone (DESYREL) 50 MG tablet Take 1 tablet (50 mg total) by mouth at bedtime as needed for sleep. 30 tablet 0  . OLANZapine (ZYPREXA) 10 MG tablet Take 1 tablet (10 mg total) by mouth 2 (two) times daily. For mood control (Patient not taking: Reported on 08/31/2018) 60 tablet 0  . traZODone (DESYREL) 50 MG tablet Take 1 tablet (50 mg total) by mouth at bedtime as needed for sleep. (Patient not taking: Reported on 08/31/2018) 7 tablet 0    Musculoskeletal: Strength & Muscle Tone: within normal limits Gait & Station: normal Patient leans: N/A  Psychiatric Specialty Exam: Physical Exam  Nursing note and vitals reviewed. Constitutional: She is oriented to person, place, and time. She appears well-developed and well-nourished.  HENT:  Head: Normocephalic and atraumatic.  Neck: Normal range of motion.  Respiratory: Effort normal.  Musculoskeletal: Normal range of motion.  Neurological: She is alert and oriented to person, place, and time.  Psychiatric: Her speech is normal. Her mood appears anxious. She is actively hallucinating. Thought content is paranoid and delusional. Cognition and memory are impaired. She expresses impulsivity.    Review  of Systems  Psychiatric/Behavioral: Positive for hallucinations. The patient is nervous/anxious.   All other systems reviewed and are negative.   Blood pressure 115/75, pulse 63, temperature 98.5 F (36.9 C), temperature source Oral, resp. rate 16, height 4' 10"  (1.473 m), weight 51.7 kg, last menstrual period 08/13/2018, SpO2 93 %.Body mass index is 23.83 kg/m.  General Appearance: Fairly Groomed  Eye Contact:  Good  Speech:  Clear and Coherent and Normal Rate  Volume:  WDL  Mood:  Anxious  Affect:  Blunt and Flat  Thought Process:  Linear and Descriptions of Associations: Intact  Orientation:  Other:  oriented to person, place  Thought Content:  Illogical, Hallucinations: Auditory and Paranoid Ideation  Suicidal Thoughts:  No  Homicidal Thoughts:  No  Memory:  Immediate;   Fair Recent;   Fair Remote;   Fair  Judgement:  Impaired  Insight:  Lacking  Psychomotor Activity:  Increased  Concentration:  Concentration: Poor and Attention Span: Poor  Recall:  [employer] of Knowledge:  Fair  Language:  Good  Akathisia:  Negative  Handed:  Right  AIMS (if indicated):   N/A  Assets:  Communication Skills Desire for Improvement Resilience Social Support  ADL's:  Intact  Cognition:  Impaired,  Moderate  Sleep:   N/A     Treatment Plan Summary: Daily contact with patient to assess and evaluate symptoms and progress in treatment, Medication management and Plan schizoaffective disorder, bipolar type:  -Continued Haldol 5 mg BID for psychosis -Continued Trazodone 50 mg at bedtime PRN sleep    Disposition: Recommend psychiatric Inpatient admission when medically cleared. Supportive therapy provided about ongoing stressors.  Waylan Boga, NP 09/02/2018 11:15 AM    Patient seen face-to-face for psychiatric evaluation, chart reviewed and case discussed with the physician extender and developed treatment plan. Reviewed the information documented and agree with the treatment  plan.  Buford Dresser, DO 09/02/18 3:16 PM

## 2018-09-03 DIAGNOSIS — F250 Schizoaffective disorder, bipolar type: Principal | ICD-10-CM

## 2018-09-03 DIAGNOSIS — F419 Anxiety disorder, unspecified: Secondary | ICD-10-CM

## 2018-09-03 MED ORDER — HYDROXYZINE HCL 50 MG PO TABS
50.0000 mg | ORAL_TABLET | Freq: Four times a day (QID) | ORAL | Status: DC | PRN
  Administered 2018-09-03 – 2018-09-05 (×4): 50 mg via ORAL
  Filled 2018-09-03 (×4): qty 1
  Filled 2018-09-03: qty 10

## 2018-09-03 MED ORDER — ZIPRASIDONE MESYLATE 20 MG IM SOLR: 20.0000 mg | Freq: Two times a day (BID) | INTRAMUSCULAR | Status: DC | PRN

## 2018-09-03 MED ORDER — OLANZAPINE 10 MG PO TABS
10.0000 mg | ORAL_TABLET | Freq: Every day | ORAL | Status: DC
  Administered 2018-09-03 – 2018-09-05 (×3): 10 mg via ORAL
  Filled 2018-09-03 (×2): qty 1
  Filled 2018-09-03 (×2): qty 7
  Filled 2018-09-03 (×2): qty 1

## 2018-09-03 MED ORDER — LORAZEPAM 1 MG PO TABS
1.0000 mg | ORAL_TABLET | Freq: Four times a day (QID) | ORAL | Status: DC | PRN
  Administered 2018-09-03 – 2018-09-04 (×2): 1 mg via ORAL
  Filled 2018-09-03 (×2): qty 1

## 2018-09-03 MED ORDER — OLANZAPINE 5 MG PO TBDP: 5.0000 mg | ORAL_TABLET | Freq: Three times a day (TID) | ORAL | Status: DC | PRN

## 2018-09-03 MED ORDER — LORAZEPAM 2 MG/ML IJ SOLN
1.0000 mg | Freq: Four times a day (QID) | INTRAMUSCULAR | Status: DC | PRN
  Filled 2018-09-03: qty 1

## 2018-09-03 NOTE — H&P (Signed)
Psychiatric Admission Assessment Adult  Patient Identification: Lakisha Peyser MRN:  [id number] Date of Evaluation:  09/03/2018 Chief Complaint:  schizophrenia Principal Diagnosis: Schizoaffective disorder, bipolar type ([facility]) Diagnosis:   Patient Active Problem List   Diagnosis Date Noted  . Schizoaffective disorder, bipolar type ([facility]) [F25.0] 09/01/2018   History of Present Illness:   [name] [name] is a 42 y/o F with history of schizoaffective disorder bipolar type (elsewhere: bipolar disorder with psychotic features) who was admitted on IVC (initiated in ED) from [facility] where she presented with paranoia, delusions, agitation, irritability, disorganized behavior, and responding to internal stimuli of AH. Pt had reported that Jesus was trying to kill her. Pt was medically cleared and then transferred to [facility] for additional treatment and stabilization. Pt has recent/relevant history of discharge from [facility] on in August 2019 to [facility] transition care team on regimen on zyprexa, and it is unclear if she has been adherent with her outpatient treatment.  Upon initial evaluation, pt shares, "I came in because I was paranoid. I was trying to change my diagnosis over the phone. I needed housing." Pt is pressured, irritable, and rambling during interview. She is able to provide narrative of events after her last discharge, that she was able to establish with an ACT team at [facility] and they helped get her into the [facility], but she had a conflict with staff and a peer, and she was asked to leave. With help from her ACT team she was able to find placement at a group home, but she left after 1 day after she found the conditions to not be clean. She has recently been staying at [facility]. She expresses that she is seeking stable housing such as an apartment but she refuses to continue to work with her ACT team because she disagrees with her diagnosis of bipolar disorder. She began to  perseverate on this topic and grew increasingly irritable when attempting to proceed with the interview. She continued to repeat, "I'm just depressed, I'm not bipolar." Pt was asked about symptoms of feeling that Jesus was trying to kill her, and she explains, "He is trying to kill me! He has for a long time, that's why he brought me here. I'm not bipolar, I'm depressed." Pt denies SI/HI/AH/VH; however, she has been observed on the unit speaking to unseen others. She endorses depressed mood but denies other symptoms of depression. She is irritable and has observable flight of ideas, pressured speech, distractibility, and mood lability. She denies symptoms of OCD and PTSD. She denies all illicit substance use.  Attempted to discuss with patient about treatment options. As pt had poor adherence after her last admission, attempted to discuss about potential benefits of long-acting injectable medication, but pt refused to consider this option. She was willing to be restarted on zyprexa at this time, so we will resume that previous medication. As pt continued to escalate in terms of irritability and began to yell at this provider, the interview was concluded.  Associated Signs/Symptoms: Depression Symptoms:  depressed mood, insomnia, feelings of worthlessness/guilt, difficulty concentrating, anxiety, (Hypo) Manic Symptoms:  Delusions, Distractibility, Flight of Ideas, Hallucinations, Impulsivity, Irritable Mood, Labiality of Mood, Anxiety Symptoms:  Excessive Worry, Psychotic Symptoms:  Delusions, Hallucinations: Auditory Ideas of Reference, Paranoia, PTSD Symptoms: NA Total Time spent with patient: 1 hour  Past Psychiatric History:  - previous diagnoses of bipolar with psychotic features and schizoaffective bipolar type - multiple inpatient stays in the past in home area of [place], last  admission was August 2019 to [facility] with discharge to [facility]. - Outpatient through  [facility] ACT team - multiple suicide attempts via overdose or walking into traffic.  Is the patient at risk to self? Yes.    Has the patient been a risk to self in the past 6 months? Yes.    Has the patient been a risk to self within the distant past? Yes.    Is the patient a risk to others? Yes.    Has the patient been a risk to others in the past 6 months? Yes.    Has the patient been a risk to others within the distant past? Yes.     Prior Inpatient Therapy:   Prior Outpatient Therapy:    Alcohol Screening: 1. How often do you have a drink containing alcohol?: Never 2. How many drinks containing alcohol do you have on a typical day when you are drinking?: 1 or 2 3. How often do you have six or more drinks on one occasion?: Never AUDIT-C Score: 0 4. How often during the last year have you found that you were not able to stop drinking once you had started?: Never 5. How often during the last year have you failed to do what was normally expected from you becasue of drinking?: Never 6. How often during the last year have you needed a first drink in the morning to get yourself going after a heavy drinking session?: Never 7. How often during the last year have you had a feeling of guilt of remorse after drinking?: Never 8. How often during the last year have you been unable to remember what happened the night before because you had been drinking?: Never 9. Have you or someone else been injured as a result of your drinking?: No 10. Has a relative or friend or a doctor or another health worker been concerned about your drinking or suggested you cut down?: No Alcohol Use Disorder Identification Test Final Score (AUDIT): 0 Intervention/Follow-up: AUDIT Score <7 follow-up not indicated Substance Abuse History in the last 12 months:  No. Consequences of Substance Abuse: NA Previous Psychotropic Medications: Yes  Psychological Evaluations: Yes  Past Medical History:  Past Medical History:   Diagnosis Date  . Arthritis   . Depression    History reviewed. No pertinent surgical history. Family History: History reviewed. No pertinent family history. Family Psychiatric  History: denies family psychiatric history Tobacco Screening: Have you used any form of tobacco in the last 30 days? (Cigarettes, Smokeless Tobacco, Cigars, and/or Pipes): No Social History:   Pt was raised in [place], and most recently she was living in [place], [place]. She took a bus to [place] and she has been staying in a motel for a few days. She is not working and is on disability. She completed her bachelor's degree at [employer] in the past. She has 1 daughter whom lives with an aunt in [place]. She denies legal history.   Social History   Substance and Sexual Activity  Alcohol Use Not Currently     Social History   Substance and Sexual Activity  Drug Use Not Currently    Additional Social History:                           Allergies:  No Known Allergies Lab Results: No results found for this or any previous visit (from the past 48 hour(s)).  Blood Alcohol level:  Lab Results  Component Value Date   ETH <10 08/31/2018   ETH <10 08/27/2018    Metabolic Disorder Labs:  Lab Results  Component Value Date   HGBA1C 4.7 (L) 06/26/2018   MPG 88.19 06/26/2018   Lab Results  Component Value Date   PROLACTIN 28.9 (H) 06/26/2018   Lab Results  Component Value Date   CHOL 121 06/26/2018   TRIG 128 06/26/2018   HDL 38 (L) 06/26/2018   CHOLHDL 3.2 06/26/2018   VLDL 26 06/26/2018   LDLCALC 57 06/26/2018    Current Medications: Current Facility-Administered Medications  Medication Dose Route Frequency Provider Last Rate Last Dose  . acetaminophen (TYLENOL) tablet 650 mg  650 mg Oral Q6H PRN Charm Rings, NP      . alum & mag hydroxide-simeth (MAALOX/MYLANTA) 200-200-20 MG/5ML suspension 30 mL  30 mL Oral Q4H PRN Charm Rings, NP      . hydrOXYzine (ATARAX/VISTARIL) tablet 50  mg  50 mg Oral Q6H PRN Micheal Likens, MD   50 mg at 09/03/18 1250  . LORazepam (ATIVAN) tablet 1 mg  1 mg Oral Q6H PRN Micheal Likens, MD   1 mg at 09/03/18 1250   Or  . LORazepam (ATIVAN) injection 1 mg  1 mg Intramuscular Q6H PRN Micheal Likens, MD      . magnesium hydroxide (MILK OF MAGNESIA) suspension 30 mL  30 mL Oral Daily PRN Charm Rings, NP      . OLANZapine (ZYPREXA) tablet 10 mg  10 mg Oral QHS Micheal Likens, MD      . OLANZapine zydis (ZYPREXA) disintegrating tablet 5 mg  5 mg Oral Q8H PRN Micheal Likens, MD       Or  . ziprasidone (GEODON) injection 20 mg  20 mg Intramuscular Q12H PRN Micheal Likens, MD      . traZODone (DESYREL) tablet 50 mg  50 mg Oral QHS PRN Charm Rings, NP   50 mg at 09/02/18 2232   PTA Medications: Medications Prior to Admission  Medication Sig Dispense Refill Last Dose  . OLANZapine (ZYPREXA) 10 MG tablet Take 1 tablet (10 mg total) by mouth 2 (two) times daily. For mood control (Patient not taking: Reported on 08/31/2018) 60 tablet 0 Not Taking at Unknown time  . OLANZapine (ZYPREXA) 10 MG tablet Take 1 tablet (10 mg total) by mouth 2 (two) times daily. 7 tablet 0 Past Month at Unknown time  . traZODone (DESYREL) 50 MG tablet Take 1 tablet (50 mg total) by mouth at bedtime as needed for sleep. 30 tablet 0 Past Month at Unknown time  . traZODone (DESYREL) 50 MG tablet Take 1 tablet (50 mg total) by mouth at bedtime as needed for sleep. (Patient not taking: Reported on 08/31/2018) 7 tablet 0 Not Taking at Unknown time    Musculoskeletal: Strength & Muscle Tone: within normal limits Gait & Station: normal Patient leans: N/A  Psychiatric Specialty Exam: Physical Exam  Nursing note and vitals reviewed.   Review of Systems  Constitutional: Negative for chills and fever.  Respiratory: Negative for cough and shortness of breath.   Cardiovascular: Negative for chest pain.   Gastrointestinal: Negative for abdominal pain, heartburn, nausea and vomiting.  Psychiatric/Behavioral: Positive for depression and hallucinations. The patient is nervous/anxious. The patient does not have insomnia.     Blood pressure 129/80, pulse (!) 104, temperature 99 F (37.2 C), temperature source Oral, resp. rate 18, height 4\' 10"  (1.473 m), weight 47.2 kg,  last menstrual period 08/13/2018.Body mass index is 21.74 kg/m.  General Appearance: Casual and Disheveled  Eye Contact:  Good  Speech:  Clear and Coherent and Normal Rate  Volume:  Normal  Mood:  Anxious and Irritable  Affect:  Blunt  Thought Process:  Coherent, Goal Directed and Descriptions of Associations: Loose  Orientation:  Full (Time, Place, and Person)  Thought Content:  Illogical, Delusions, Hallucinations: Auditory, Ideas of Reference:   Paranoia Delusions, Paranoid Ideation and Rumination  Suicidal Thoughts:  No  Homicidal Thoughts:  No  Memory:  Immediate;   Fair Recent;   Fair Remote;   Fair  Judgement:  Poor  Insight:  Lacking  Psychomotor Activity:  Normal  Concentration:  Concentration: Fair  Recall:  [employer] of Knowledge:  Fair  Language:  Fair  Akathisia:  No  Handed:    AIMS (if indicated):     Assets:  Resilience Social Support  ADL's:  Intact  Cognition:  WNL  Sleep:  Number of Hours: 4.5   Treatment Plan Summary: Daily contact with patient to assess and evaluate symptoms and progress in treatment and Medication management  Observation Level/Precautions:  15 minute checks  Laboratory:  CBC Chemistry Profile HbAIC HCG UDS UA  Psychotherapy:  Encourage participation in groups and therapeutic milieu   Medications:  Start zyprexa 10mg  po qhs. Start vistaril 50mg  po q6h prn anxiety. Start ativan 1mg  po/IM q6h prn agitation. Start zydis 5mg  po q8h prn agitation OR geodon 20mg  IM q12h prn severe agitation and unable to take oral medication. Start trazodone 50mg  po qhs prn insomnia.   Consultations:    Discharge Concerns:    Estimated LOS: 5-7 days  Other:     Physician Treatment Plan for Primary Diagnosis: Schizoaffective disorder, bipolar type (HCC) Long Term Goal(s): Improvement in symptoms so as ready for discharge  Short Term Goals: Ability to identify and develop effective coping behaviors will improve  Physician Treatment Plan for Secondary Diagnosis: Principal Problem:   Schizoaffective disorder, bipolar type (HCC)  Long Term Goal(s): Improvement in symptoms so as ready for discharge  Short Term Goals: Ability to demonstrate self-control will improve and Compliance with prescribed medications will improve  I certify that inpatient services furnished can reasonably be expected to improve the patient's condition.    Micheal Likens, MD 10/29/20191:47 PM

## 2018-09-03 NOTE — Progress Notes (Signed)
Recreation Therapy Notes  Date: 10.29.19 Time: 1000 Location: 500 Hall Dayroom  Group Topic: Leisure Education  Goal Area(s) Addresses:  Patient will identify positive leisure activities.  Patient will identify one positive benefit of participation in leisure activities.   Behavioral Response: None  Intervention: [employer], dry erase marker, eraser, various leisure activities  Activity: Pictionary. Patients were given the opportunity to pull a leisure activity from the container.  Patients were to then draw the activity on the board.  The remaining patients were to guess what the activity was.  The person that correctly guesses the activity gets the next turn.   Education:  Leisure Education, Building control surveyor  Education Outcome: Acknowledges education/In group clarification offered/Needs additional education  Clinical Observations/Feedback:  Pt was sitting in group talking to herself.  Pt was called out of group to meet with social worker and did not return.    [name], LRT/CTRS         [name] A 09/03/2018 12:30 PM

## 2018-09-03 NOTE — Progress Notes (Signed)
D: Pt denies SI/HI/AVH. Pt is pleasant and cooperative. Pt visible on the unit having conversations with people not seen by staff. Pt stated she had a good day, pt continues to request for help with housing and continues to insist she is only depressed and does not have any other issues.  A: Pt was offered support and encouragement. Pt was given scheduled medications. Pt was encourage to attend groups. Q 15 minute checks were done for safety.  R: Pt is taking medication. Pt has no complaints.Pt receptive to treatment and safety maintained on unit.  Problem: Education: Goal: Mental status will improve Outcome: Progressing   Problem: Activity: Goal: Interest or engagement in activities will improve Outcome: Progressing   Problem: Coping: Goal: Ability to demonstrate self-control will improve Outcome: Progressing   Problem: Safety: Goal: Periods of time without injury will increase Outcome: Progressing

## 2018-09-03 NOTE — Plan of Care (Signed)
  Problem: Activity: Goal: Interest or engagement in activities will improve Outcome: Not Progressing   Problem: Safety: Goal: Periods of time without injury will increase Outcome: Progressing  DAR NOTE: Patient presents with anxious affect and irritable mood.  Denies suicidal thoughts and pain. Patient visible in milieu and is observed talking very loud to herself.  Patient returned from the Caf[employer]ia yelling and saying "stop following me", "leave me alone."  Patient offered Ativan 1 mg and Vistaril 50 mg after several encouragements.  Rates depression at 4, hopelessness at 2, and anxiety at 2.  Maintained on routine safety checks.  Support and encouragement offered as needed.  Attended group and participated.  States goal for today is "plan to work on my anxiety."  Patient remained safe on the unit.

## 2018-09-03 NOTE — BHH Suicide Risk Assessment (Signed)
[facility] Admission Suicide Risk Assessment   Nursing information obtained from:  Patient Demographic factors:  Low socioeconomic status Current Mental Status:  NA Loss Factors:  Financial problems / change in socioeconomic status Historical Factors:  NA Risk Reduction Factors:  NA  Total Time spent with patient: 1 hour Principal Problem: Schizoaffective disorder, bipolar type (HCC) Diagnosis:   Patient Active Problem List   Diagnosis Date Noted  . Schizoaffective disorder, bipolar type (HCC) [F25.0] 09/01/2018   Subjective Data: see H&P  Continued Clinical Symptoms:  Alcohol Use Disorder Identification Test Final Score (AUDIT): 0 The "Alcohol Use Disorders Identification Test", Guidelines for Use in Primary Care, Second Edition.  World Science writer [facility]). Score between 0-7:  no or low risk or alcohol related problems. Score between 8-15:  moderate risk of alcohol related problems. Score between 16-19:  high risk of alcohol related problems. Score 20 or above:  warrants further diagnostic evaluation for alcohol dependence and treatment.   CLINICAL FACTORS:   Severe Anxiety and/or Agitation Bipolar Disorder:   Depressive phase Schizophrenia:   Paranoid or undifferentiated type More than one psychiatric diagnosis Currently Psychotic Unstable or Poor Therapeutic Relationship Previous Psychiatric Diagnoses and Treatments   Musculoskeletal: Strength & Muscle Tone: within normal limits Gait & Station: normal Patient leans: N/A  Psychiatric Specialty Exam: Physical Exam  Nursing note and vitals reviewed.     Blood pressure 129/80, pulse (!) 104, temperature 99 F (37.2 C), temperature source Oral, resp. rate 18, height 4\' 10"  (1.473 m), weight 47.2 kg, last menstrual period 08/13/2018.Body mass index is 21.74 kg/m.   COGNITIVE FEATURES THAT CONTRIBUTE TO RISK:  Closed-mindedness and Thought constriction (tunnel vision)    SUICIDE RISK:   Mild:  Suicidal ideation of  limited frequency, intensity, duration, and specificity.  There are no identifiable plans, no associated intent, mild dysphoria and related symptoms, good self-control (both objective and subjective assessment), few other risk factors, and identifiable protective factors, including available and accessible social support.  PLAN OF CARE: see H&P  I certify that inpatient services furnished can reasonably be expected to improve the patient's condition.   Micheal Likens, MD 09/03/2018, 2:05 PM

## 2018-09-03 NOTE — Progress Notes (Signed)
Admission Note:  42 yr female who presents IVC in no acute distress for the treatment of Psychosis and Depression. Pt appears flat and depressed. Pt was initially irritable with the admission refusing to come on the unit , until she was convinced to cooperate [facility], then pt was  calm and cooperative ( after coming on the unit) with admission process . Pt denies SI/ HI/AVH at this time. Pt was observed talking with people not seen by staff in the search room. Pt did express " GOD is trying to kill me, I'm tired of him following me". Pt was convinced by writer to talk with the doctor and to continue to take her medications . Pt endorsed being off her medications for at least 1 month. Pt stated that the Trazodone, Zyprexa , and Ativan were the medications that worked best for her. Pt stated she was paranoid before coming in and said she filed a police report about" GOD bothering her" . Pt stated she was in process of getting her own place until the voice of GOD started bothering her, pt does not endorse that the voice she hears is a Hallucination. Pt was encouraged to talk with SW to help with possible housing, and ACTT team or outpatient. Pt was encouraged to talk with the doctor about her IVC status and informed that she would have to comply with her Tx plan .   Skin was assessed and found to have Scars: L-elbow, L-hand, R-shoulder to elbow, R-pelvic region. PT searched and no contraband found, POC and unit policies explained and understanding verbalized. Consents obtained. Food and fluids offered, and accepted.  R: Pt had no additional questions or concerns. Pt appeared calm and cooperative after pt got on the unit and talked to writer , and appeared to understand she would have to be here for some time , until she is no longer a danger to herself or others.

## 2018-09-03 NOTE — Progress Notes (Signed)
Pt stated she was depressed and not bipolar. Pt wanted the doctor to change her diagnosis . Pt was informed her diagnosis  Depends on what is happening to her. Pt was encouraged to inform the doctor of all the things going on with her .

## 2018-09-03 NOTE — BHH Group Notes (Signed)
BHH LCSW Group Therapy Note  Date/Time: 09/03/18, 1100  Type of Therapy/Topic:  Group Therapy:  Feelings about Diagnosis  Participation Level:  None   Mood:distracted   Description of Group:    This group will allow patients to explore their thoughts and feelings about diagnoses they have received. Patients will be guided to explore their level of understanding and acceptance of these diagnoses. Facilitator will encourage patients to process their thoughts and feelings about the reactions of others to their diagnosis, and will guide patients in identifying ways to discuss their diagnosis with significant others in their lives. This group will be process-oriented, with patients participating in exploration of their own experiences as well as giving and receiving support and challenge from other group members.   Therapeutic Goals: 1. Patient will demonstrate understanding of diagnosis as evidence by identifying two or more symptoms of the disorder:  2. Patient will be able to express two feelings regarding the diagnosis 3. Patient will demonstrate ability to communicate their needs through discussion and/or role plays  Summary of Patient Progress:Pt came into group late and was talking to herself throughout the time she was present.  CSW asked her one question regarding her diagnosis, which she did respond to, however, she was distracted and not attentive to the topic or discussion.        Therapeutic Modalities:   Cognitive Behavioral Therapy Brief Therapy Feelings Identification   Daleen Squibb, LCSW

## 2018-09-03 NOTE — BHH Counselor (Signed)
Adult Comprehensive Assessment  Patient ID: [name] [name], female   DOB: 1976-10-06, 42 y.o.   MRN: [id number]  Information Source: Information source: Patient  Current Stressors:  Patient states their primary concerns and needs for treatment are:: "I need to be stable." Patient states their goals for this hospitilization and ongoing recovery are:: Pt would like to change her diagnosis.  Pt says she is not bipolar and does not have schizophrenia. "I just have depression." Housing / Lack of housing: Pt reports she has been staying at the [facility] and has not had stable housing in some time.   Physical health (include injuries & life threatening diseases): Pt reports "I've been paranoid since last Thursday."  Living/Environment/Situation:  Living Arrangements:Pt reports she is currently at [facility].  Pt reports she went to hotels after last admission and was also at [employer] group home but did not like it and left.  Pt moved to [place] from [place] prior to last admission in August 2019. Pt reports she is working with Warden/ranger project on a housing program.  Living conditions (as described by patient or guardian): OK Who else lives in the home?: other residents How long has patient lived in current situation?: 2-3 weeks.  What is atmosphere in current home: Temporary  Family History:  Current relationship? Pt reports she has a boyfriend in [place] who continues to be supportive.  Pt in relationship with boyfriend for past 3 months.   Are you sexually active?: No What is your sexual orientation?: straight Does patient have children?: Yes How many children?: 1 How is patient's relationship with their children?: 65 YO daughter living with aunt in [place] daughter's father is in [place]  Childhood History:  By whom was/is the patient raised?: Pt parents split up when pt was 28, father left, mother became  involved with alcohol and pt ended up in foster care from age [age], but lived with her god parents, which was OK.   Description of patient's relationship with caregiver when they were a child: good Patient's description of current relationship with people who raised him/her:  Father in [place], some contact.   mother died 17 years ago Does patient have siblings?: Yes Number of Siblings: 3 Description of patient's current relationship with siblings: 1 sister deceased, i sister in [place], one in [place]. Little contact with sisters.  Did patient suffer any verbal/emotional/physical/sexual abuse as a child?: No Did patient suffer from severe childhood neglect?: No Has patient ever been sexually abused/assaulted/raped as an adolescent or adult?: No Was the patient ever a victim of a crime or a disaster?: No Witnessed domestic violence?: No Has patient been effected by domestic violence as an adult?: No  Education:  Highest grade of school patient has completed: bachelor's from [employer] Currently a student?: No(States her goal is to enroll at [employer]) Learning disability?: No  Employment/Work Situation:   Employment situation: On disability Why is patient on disability: mental health How long has patient been on disability: 43 Years Patient's job has been impacted by current illness: No Did You Receive Any Psychiatric Treatment/Services While in the [employer]?: No Are There Guns or Other Weapons in Your Home?: No guns reported.   Financial Resources:   Financial resources: [employer], Medicaid Does patient have a Lawyer or guardian?: No  Alcohol/Substance Abuse:   Alcohol/Substance Abuse Treatment Hx: Alcohol: pt denies, drugs: pt denies Has alcohol/substance abuse ever caused legal problems?: No  Social Support System:   Forensic psychologist System: poor Describe Community Support System: boyfriend in [place] Type of faith/religion: N/A How  does patient's faith help to cope with current illness?: N/A  Leisure/Recreation:   Leisure and Hobbies: "Learning new things"   Strengths/Needs:   What is the patient's perception of their strengths?: Artistic, good at cleaning. Patient states they can use these personal strengths during their treatment to contribute to their recovery: " I just need a stable place to live." Patient states these barriers may affect/interfere with their treatment: none Patient states these barriers may affect their return to the community: no transportation but does use bus Other important information patient would like considered in planning for their treatment: none  Discharge Plan:   Currently receiving community mental health services: Yes (From Whom)([facility] community support team, [facility] for meds.) Patient states concerns and preferences for aftercare planning are: will continue with current providers. Patient states they will know when they are safe and ready for discharge when: "I'm ready now." Does patient have access to transportation?: No Does patient have financial barriers related to discharge medications?: No Plan for no access to transportation at discharge: CSW assessing for plan. Will patient be returning to same living situation after discharge?: (Unknown shelter availability.)  Summary/Recommendations:   Summary and Recommendations (to be completed by the evaluator): Pt is 42 year old female currently staying in the [facility] in [place].  Pt is diagnosed with schizophrenia and was admitted due to paranoia.  Recommendations for pt include crisis stabilization, therapeutic milieu, attend and participate in groups, medication management, and development of comprehensive mental wellness plan.  Lorri Frederick. 09/03/2018

## 2018-09-03 NOTE — Progress Notes (Signed)
Patient ID: [name] [name], female   DOB: 10/24/1976, 42 y.o.   MRN: [id number] PER STATE REGULATIONS 482.30  THIS CHART WAS REVIEWED FOR MEDICAL NECESSITY WITH RESPECT TO THE PATIENT'S ADMISSION/DURATION OF STAY.  NEXT REVIEW DATE:09/06/18  Loura Halt, RN, BSN CASE MANAGER

## 2018-09-03 NOTE — Progress Notes (Signed)
Recreation Therapy Notes  Patient admitted to unit 10.28.19. Due to admission within last year, no new assessment conducted at this time. Last assessment conducted 8.20.19. Patient reports her stressor as being paranoid.  Pt stated her reason for admission remains stress.   Patient denies SI, HI, AVH at this time. Patient reports goal of getting better so she can go home and to school.  Information found below from assessment conducted 8.20.19   Coping Skills:  TV, Exercise, Music, Talk, Art, Avoidance, Hot Bath/Shower, Shop  Leisure Interests:  TV, Cleaning, Partying  Patient Strengths:  Artistic, Neat  Areas of Improvement:  Make more friends    Jiovany Scheffel [name], LRT/CTRS        [name] A 09/03/2018 1:36 PM

## 2018-09-04 MED ORDER — IBUPROFEN 600 MG PO TABS
600.0000 mg | ORAL_TABLET | Freq: Four times a day (QID) | ORAL | Status: DC | PRN
  Administered 2018-09-06: 600 mg via ORAL
  Filled 2018-09-04: qty 1

## 2018-09-04 MED ORDER — TRAZODONE HCL 50 MG PO TABS
50.0000 mg | ORAL_TABLET | Freq: Once | ORAL | Status: AC
  Administered 2018-09-04: 50 mg via ORAL

## 2018-09-04 NOTE — Progress Notes (Signed)
Recreation Therapy Notes  Date: 10.30.19 Time: 1000 Location: 500 Hall Dayroom   Group Topic: Stress Management  Goal Area(s) Addresses:  Patient will verbalize importance of using healthy stress management.  Patient will identify positive emotions associated with healthy stress management.   Behavioral Response: None  Intervention: Stress Management  Activity :  Guided Imagery & Meditation.  LRT introduced the stress management techniques of guided imagery and meditation.  LRT read a script for patients to envision themselves out looking at the starry sky at night.  LRT then played a meditation to help patients build up their resilience to uncomfortable situations.  Education: Stress Management, Discharge Planning.   Education Outcome: Acknowledges edcuation/In group clarification offered/Needs additional education  Clinical Observations/Feedback: Pt was late to group after meeting with Child psychotherapist.  Pt was unable to focus.  Pt was talking and laughing to herself during the group session.    [name], LRT/CTRS      [name] A 09/04/2018 12:06 PM

## 2018-09-04 NOTE — Tx Team (Signed)
Initial Treatment Plan 09/04/2018 1:01 AM [name] [name] ZOX:[id number]    PATIENT STRESSORS: Financial difficulties Marital or family conflict Medication change or noncompliance   PATIENT STRENGTHS: General fund of knowledge Motivation for treatment/growth   PATIENT IDENTIFIED PROBLEMS: depression  psychosis  "help with place to live"                 DISCHARGE CRITERIA:  Improved stabilization in mood, thinking, and/or behavior Verbal commitment to aftercare and medication compliance  PRELIMINARY DISCHARGE PLAN: Attend aftercare/continuing care group Attend PHP/IOP Outpatient therapy  PATIENT/FAMILY INVOLVEMENT: This treatment plan has been presented to and reviewed with the patient, [name] [name].  The patient and family have been given the opportunity to ask questions and make suggestions.  Delos Haring, RN 09/04/2018, 1:01 AM

## 2018-09-04 NOTE — BHH Group Notes (Signed)
LCSW Group Therapy Note  09/04/2018 4:32 PM  Type of Therapy/Topic:  Group Therapy:  Emotion Regulation  Participation Level:  None   Description of Group:   The purpose of this group is to assist patients in learning to regulate negative emotions and experience positive emotions. Patients will be guided to discuss ways in which they have been vulnerable to their negative emotions. These vulnerabilities will be juxtaposed with experiences of positive emotions or situations, and patients will be challenged to use positive emotions to combat negative ones. Special emphasis will be placed on coping with negative emotions in conflict situations, and patients will process healthy conflict resolution skills.  Therapeutic Goals: 1. Patient will identify two positive emotions or experiences to reflect on in order to balance out   negative emotions 2. Patient will label two or more emotions that they find the most difficult to experience 3. Patient will demonstrate positive conflict resolution skills through discussion and/or role plays  Summary of Patient Progress:  [name] [name] arrived late to the session. Her affect was flat. She was not engaged with the activity.    Therapeutic Modalities:   Cognitive Behavioral Therapy Feelings Identification Dialectical Behavioral Therapy   Marian Sorrow MSW Intern 09/04/2018 4:32 PM

## 2018-09-04 NOTE — Tx Team (Signed)
Interdisciplinary Treatment and Diagnostic Plan Update  09/04/2018 Time of Session: 1004 [name] [name] MRN: [id number]  Principal Diagnosis: Schizoaffective disorder, bipolar type [facility])  Secondary Diagnoses: Principal Problem:   Schizoaffective disorder, bipolar type (HCC)   Current Medications:  Current Facility-Administered Medications  Medication Dose Route Frequency Provider Last Rate Last Dose  . acetaminophen (TYLENOL) tablet 650 mg  650 mg Oral Q6H PRN Charm Rings, NP      . alum & mag hydroxide-simeth (MAALOX/MYLANTA) 200-200-20 MG/5ML suspension 30 mL  30 mL Oral Q4H PRN Charm Rings, NP      . hydrOXYzine (ATARAX/VISTARIL) tablet 50 mg  50 mg Oral Q6H PRN Micheal Likens, MD   50 mg at 09/03/18 2150  . ibuprofen (ADVIL,MOTRIN) tablet 600 mg  600 mg Oral Q6H PRN Micheal Likens, MD      . LORazepam (ATIVAN) tablet 1 mg  1 mg Oral Q6H PRN Micheal Likens, MD   1 mg at 09/03/18 1250   Or  . LORazepam (ATIVAN) injection 1 mg  1 mg Intramuscular Q6H PRN Micheal Likens, MD      . magnesium hydroxide (MILK OF MAGNESIA) suspension 30 mL  30 mL Oral Daily PRN Charm Rings, NP      . OLANZapine (ZYPREXA) tablet 10 mg  10 mg Oral QHS Micheal Likens, MD   10 mg at 09/03/18 2150  . OLANZapine zydis (ZYPREXA) disintegrating tablet 5 mg  5 mg Oral Q8H PRN Micheal Likens, MD       Or  . ziprasidone (GEODON) injection 20 mg  20 mg Intramuscular Q12H PRN Micheal Likens, MD      . traZODone (DESYREL) tablet 50 mg  50 mg Oral QHS PRN Charm Rings, NP   50 mg at 09/03/18 2150   PTA Medications: Medications Prior to Admission  Medication Sig Dispense Refill Last Dose  . OLANZapine (ZYPREXA) 10 MG tablet Take 1 tablet (10 mg total) by mouth 2 (two) times daily. For mood control (Patient not taking: Reported on 08/31/2018) 60 tablet 0 Not Taking at Unknown time  . OLANZapine (ZYPREXA) 10 MG tablet Take 1 tablet (10 mg  total) by mouth 2 (two) times daily. 7 tablet 0 Past Month at Unknown time  . traZODone (DESYREL) 50 MG tablet Take 1 tablet (50 mg total) by mouth at bedtime as needed for sleep. 30 tablet 0 Past Month at Unknown time  . traZODone (DESYREL) 50 MG tablet Take 1 tablet (50 mg total) by mouth at bedtime as needed for sleep. (Patient not taking: Reported on 08/31/2018) 7 tablet 0 Not Taking at Unknown time    Patient Stressors: Financial difficulties Marital or family conflict Medication change or noncompliance  Patient Strengths: Geographical information systems officer for treatment/growth  Treatment Modalities: Medication Management, Group therapy, Case management,  1 to 1 session with clinician, Psychoeducation, Recreational therapy.   Physician Treatment Plan for Primary Diagnosis: Schizoaffective disorder, bipolar type (HCC) Long Term Goal(s): Improvement in symptoms so as ready for discharge Improvement in symptoms so as ready for discharge   Short Term Goals: Ability to identify and develop effective coping behaviors will improve Ability to demonstrate self-control will improve Compliance with prescribed medications will improve  Medication Management: Evaluate patient's response, side effects, and tolerance of medication regimen.  Therapeutic Interventions: 1 to 1 sessions, Unit Group sessions and Medication administration.  Evaluation of Outcomes: Progressing  Physician Treatment Plan for Secondary Diagnosis: Principal Problem:  Schizoaffective disorder, bipolar type (HCC)  Long Term Goal(s): Improvement in symptoms so as ready for discharge Improvement in symptoms so as ready for discharge   Short Term Goals: Ability to identify and develop effective coping behaviors will improve Ability to demonstrate self-control will improve Compliance with prescribed medications will improve     Medication Management: Evaluate patient's response, side effects, and tolerance of  medication regimen.  Therapeutic Interventions: 1 to 1 sessions, Unit Group sessions and Medication administration.  Evaluation of Outcomes: Progressing   RN Treatment Plan for Primary Diagnosis: Schizoaffective disorder, bipolar type (HCC) Long Term Goal(s): Knowledge of disease and therapeutic regimen to maintain health will improve  Short Term Goals: Ability to identify and develop effective coping behaviors will improve and Compliance with prescribed medications will improve  Medication Management: RN will administer medications as ordered by provider, will assess and evaluate patient's response and provide education to patient for prescribed medication. RN will report any adverse and/or side effects to prescribing provider.  Therapeutic Interventions: 1 on 1 counseling sessions, Psychoeducation, Medication administration, Evaluate responses to treatment, Monitor vital signs and CBGs as ordered, Perform/monitor CIWA, COWS, AIMS and Fall Risk screenings as ordered, Perform wound care treatments as ordered.  Evaluation of Outcomes: Progressing   LCSW Treatment Plan for Primary Diagnosis: Schizoaffective disorder, bipolar type (HCC) Long Term Goal(s): Safe transition to appropriate next level of care at discharge, Engage patient in therapeutic group addressing interpersonal concerns.  Short Term Goals: Engage patient in aftercare planning with referrals and resources, Increase social support and Increase skills for wellness and recovery  Therapeutic Interventions: Assess for all discharge needs, 1 to 1 time with Social worker, Explore available resources and support systems, Assess for adequacy in community support network, Educate family and significant other(s) on suicide prevention, Complete Psychosocial Assessment, Interpersonal group therapy.  Evaluation of Outcomes: Progressing   Progress in Treatment: Attending groups: Yes. Participating in groups: Yes. Taking medication as  prescribed: Yes. Toleration medication: Yes. Family/Significant other contact made: No, will contact:  pt declined consent Patient understands diagnosis: Yes. Discussing patient identified problems/goals with staff: Yes. Medical problems stabilized or resolved: Yes. Denies suicidal/homicidal ideation: Yes. Issues/concerns per patient self-inventory: No. Other: none  New problem(s) identified: No, Describe:  none  New Short Term/Long Term Goal(s):  Patient Goals:  "take my medication so I'm not paranoid"  Discharge Plan or Barriers:   Reason for Continuation of Hospitalization: Delusions  Medication stabilization  Estimated Length of Stay: 3-5 days.  Attendees: Patient:[name] [name] 09/04/2018   Physician: Dr Altamese Edgewood, MD 09/04/2018   Nursing: Dossie Arbour, RN 09/04/2018   RN Care Manager: 09/04/2018   Social Worker: Daleen Squibb, LCSW 09/04/2018   Recreational Therapist:  09/04/2018   Other:  09/04/2018   Other:  09/04/2018   Other: 09/04/2018        Scribe for Treatment Team: Lorri Frederick, LCSW 09/04/2018 4:18 PM

## 2018-09-04 NOTE — Progress Notes (Signed)
[facility] MD Progress Note  09/04/2018 4:01 PM [name] [name]  MRN:  [id number] Subjective:    History as per psychiatric intake: [name] [name] is a 42 y/o F with history of schizoaffective disorder bipolar type (elsewhere: bipolar disorder with psychotic features) who was admitted on IVC (initiated in ED) from [facility] where she presented with paranoia, delusions, agitation, irritability, disorganized behavior, and responding to internal stimuli of AH. Pt had reported that Jesus was trying to kill her. Pt was medically cleared and then transferred to [facility] for additional treatment and stabilization. Pt has recent/relevant history of discharge from [facility] in August [facility] to [facility] transition care team on regimen on zyprexa, and it is unclear if she has been adherent with her outpatient treatment. Upon initial evaluation, pt shares, "I came in because I was paranoid. I was trying to change my diagnosis over the phone. I needed housing." Pt is pressured, irritable, and rambling during interview. She is able to provide narrative of events after her last discharge, that she was able to establish with an ACT team at [facility] and they helped get her into the [employer], but she had a conflict with staff and a peer, and she was asked to leave. With help from her ACT team she was able to find placement at a group home, but she left after 1 day after she found the conditions to not be clean. She has recently been staying at [facility]. She expresses that she is seeking stable housing such as an apartment but she refuses to continue to work with her ACT team because she disagrees with her diagnosis of bipolar disorder. She began to perseverate on this topic and grew increasingly irritable when attempting to proceed with the interview. She continued to repeat, "I'm just depressed, I'm not bipolar." Pt was asked about symptoms of feeling that Jesus was trying to kill her, and she explains, "He is trying to  kill me! He has for a long time, that's why he brought me here. I'm not bipolar, I'm depressed." Pt denies SI/HI/AH/VH; however, she has been observed on the unit speaking to unseen others. She endorses depressed mood but denies other symptoms of depression. She is irritable and has observable flight of ideas, pressured speech, distractibility, and mood lability. She denies symptoms of OCD and PTSD. She denies all illicit substance use. Attempted to discuss with patient about treatment options. As pt had poor adherence after her last admission, attempted to discuss about potential benefits of long-acting injectable medication, but pt refused to consider this option. She was willing to be restarted on zyprexa at this time, so we will resume that previous medication. As pt continued to escalate in terms of irritability and began to yell at this provider, the interview was concluded.  Today as per evaluation: Today upon evaluation, pt shares, "I'm doing pretty good." She denies any specific concerns. She is sleeping well. Her appetite is good. She denies other physical complaints. She denies SI/HI/AH/VH. She is subjectively calmer and more cooperative with the interview. She is goal-oriented about re-engaging with outpatient services and securing stable housing. She is tolerating her medications well. Discussed with pt about potential benefits of a long-acting injectable medication such as improved adherence and convenience, and pt voices preference to remain on zyprexa without changes. She also declines offer to add an additional medication such as a mood stabilizer. Pt is in agreement to continue her current regimen without changes. She had no further questions, comments, or concerns.  Principal Problem: Schizoaffective disorder, bipolar type (HCC) Diagnosis:   Patient Active Problem List   Diagnosis Date Noted  . Schizoaffective disorder, bipolar type (HCC) [F25.0] 09/01/2018   Total Time spent with  patient: 30 minutes  Past Psychiatric History: see H&P  Past Medical History:  Past Medical History:  Diagnosis Date  . Arthritis   . Depression    History reviewed. No pertinent surgical history. Family History: History reviewed. No pertinent family history. Family Psychiatric  History: see H&P Social History:  Social History   Substance and Sexual Activity  Alcohol Use Not Currently     Social History   Substance and Sexual Activity  Drug Use Not Currently    Social History   Socioeconomic History  . Marital status: Single    Spouse name: Not on file  . Number of children: Not on file  . Years of education: Not on file  . Highest education level: Not on file  Occupational History  . Not on file  Social Needs  . Financial resource strain: Not on file  . Food insecurity:    Worry: Not on file    Inability: Not on file  . Transportation needs:    Medical: Not on file    Non-medical: Not on file  Tobacco Use  . Smoking status: Never Smoker  . Smokeless tobacco: Never Used  Substance and Sexual Activity  . Alcohol use: Not Currently  . Drug use: Not Currently  . Sexual activity: Not Currently  Lifestyle  . Physical activity:    Days per week: Not on file    Minutes per session: Not on file  . Stress: Not on file  Relationships  . Social connections:    Talks on phone: Not on file    Gets together: Not on file    Attends religious service: Not on file    Active member of club or organization: Not on file    Attends meetings of clubs or organizations: Not on file    Relationship status: Not on file  Other Topics Concern  . Not on file  Social History Narrative  . Not on file   Additional Social History:                         Sleep: Good  Appetite:  Good  Current Medications: Current Facility-Administered Medications  Medication Dose Route Frequency Provider Last Rate Last Dose  . acetaminophen (TYLENOL) tablet 650 mg  650 mg Oral Q6H  PRN Charm Rings, NP      . alum & mag hydroxide-simeth (MAALOX/MYLANTA) 200-200-20 MG/5ML suspension 30 mL  30 mL Oral Q4H PRN Charm Rings, NP      . hydrOXYzine (ATARAX/VISTARIL) tablet 50 mg  50 mg Oral Q6H PRN Micheal Likens, MD   50 mg at 09/03/18 2150  . LORazepam (ATIVAN) tablet 1 mg  1 mg Oral Q6H PRN Micheal Likens, MD   1 mg at 09/03/18 1250   Or  . LORazepam (ATIVAN) injection 1 mg  1 mg Intramuscular Q6H PRN Micheal Likens, MD      . magnesium hydroxide (MILK OF MAGNESIA) suspension 30 mL  30 mL Oral Daily PRN Charm Rings, NP      . OLANZapine (ZYPREXA) tablet 10 mg  10 mg Oral QHS Micheal Likens, MD   10 mg at 09/03/18 2150  . OLANZapine zydis (ZYPREXA) disintegrating tablet 5 mg  5 mg Oral  Q8H PRN Micheal Likens, MD       Or  . ziprasidone (GEODON) injection 20 mg  20 mg Intramuscular Q12H PRN Micheal Likens, MD      . traZODone (DESYREL) tablet 50 mg  50 mg Oral QHS PRN Charm Rings, NP   50 mg at 09/03/18 2150    Lab Results: No results found for this or any previous visit (from the past 48 hour(s)).  Blood Alcohol level:  Lab Results  Component Value Date   ETH <10 08/31/2018   ETH <10 08/27/2018    Metabolic Disorder Labs: Lab Results  Component Value Date   HGBA1C 4.7 (L) 06/26/2018   MPG 88.19 06/26/2018   Lab Results  Component Value Date   PROLACTIN 28.9 (H) 06/26/2018   Lab Results  Component Value Date   CHOL 121 06/26/2018   TRIG 128 06/26/2018   HDL 38 (L) 06/26/2018   CHOLHDL 3.2 06/26/2018   VLDL 26 06/26/2018   LDLCALC 57 06/26/2018    Physical Findings: AIMS: Facial and Oral Movements Muscles of Facial Expression: Minimal Lips and Perioral Area: Minimal Jaw: None, normal Tongue: Minimal,Extremity Movements Upper (arms, wrists, hands, fingers): None, normal Lower (legs, knees, ankles, toes): None, normal, Trunk Movements Neck, shoulders, hips: None, normal, Overall  Severity Severity of abnormal movements (highest score from questions above): None, normal Incapacitation due to abnormal movements: None, normal Patient's awareness of abnormal movements (rate only patient's report): No Awareness, Dental Status Current problems with teeth and/or dentures?: No Does patient usually wear dentures?: No  CIWA:  CIWA-Ar Total: 0 COWS:  COWS Total Score: 2  Musculoskeletal: Strength & Muscle Tone: within normal limits Gait & Station: normal Patient leans: N/A  Psychiatric Specialty Exam: Physical Exam  Nursing note and vitals reviewed.   Review of Systems  Constitutional: Negative for chills and fever.  Respiratory: Negative for cough and shortness of breath.   Cardiovascular: Negative for chest pain.  Gastrointestinal: Negative for abdominal pain, heartburn, nausea and vomiting.  Psychiatric/Behavioral: Negative for depression, hallucinations and suicidal ideas. The patient is not nervous/anxious and does not have insomnia.     Blood pressure 114/82, pulse (!) 115, temperature 98.4 F (36.9 C), temperature source Oral, resp. rate 16, height 4\' 10"  (1.473 m), weight 47.2 kg, last menstrual period 08/13/2018.Body mass index is 21.74 kg/m.  General Appearance: Casual and Fairly Groomed  Eye Contact:  Good  Speech:  Clear and Coherent and Normal Rate  Volume:  Normal  Mood:  Euthymic  Affect:  Appropriate and Congruent  Thought Process:  Coherent and Goal Directed  Orientation:  Full (Time, Place, and Person)  Thought Content:  Logical  Suicidal Thoughts:  No  Homicidal Thoughts:  No  Memory:  Immediate;   Fair Recent;   Fair Remote;   Fair  Judgement:  Fair  Insight:  Lacking  Psychomotor Activity:  Normal  Concentration:  Concentration: Fair  Recall:  [employer] of Knowledge:  Fair  Language:  Fair  Akathisia:  No  Handed:    AIMS (if indicated):     Assets:  Resilience Social Support  ADL's:  Intact  Cognition:  WNL  Sleep:  Number  of Hours: 6.75   Treatment Plan Summary: Daily contact with patient to assess and evaluate symptoms and progress in treatment and Medication management   -Continue inpatient hospitalization  -Schizoaffective disorder, bipolar type   -Continue zyprexa 10mg  po qhs  -anxiety  -Continue vistaril 50mg  po q6h prn  anxiety  -agitation    -Continue ativan 1mg  po/IM q6h prn agitation   -Continue zydis 5mg  po q8h prn agitation   -Continue geodon 20mg  IM q12h prn severe agitation  -insomnia   -Continue trazodone 50mg  po qhs prn insomnia  -Encourage participation in groups and therapeutic milieu  -disposition planning will be ongoing  Micheal Likens, MD 09/04/2018, 4:01 PM

## 2018-09-04 NOTE — Progress Notes (Signed)
Adult Psychoeducational Group Note  Date:  09/04/2018 Time:  8:48 PM  Group Topic/Focus:  Wrap-Up Group:   The focus of this group is to help patients review their daily goal of treatment and discuss progress on daily workbooks.  Participation Level:  Active  Participation Quality:  Appropriate  Affect:  Appropriate  Cognitive:  Appropriate  Insight: Appropriate  Engagement in Group:  Engaged  Modes of Intervention:  Discussion  Additional Comments: The patient expressed that she rates today a 7.the patient also said that she achieved her goal and attended group.  Octavio Manns 09/04/2018, 8:48 PM

## 2018-09-04 NOTE — Progress Notes (Signed)
D: Pt denies SI/HI/AVh. Pt is pleasant and cooperative. Pt continues to talk to people not seen, but pt has been calmer in the dayroom this evening.  A: Pt was offered support and encouragement. Pt was given scheduled medications. Pt was encourage to attend groups. Q 15 minute checks were done for safety.  R: Pt is taking medication. Pt has no complaints.Pt receptive to treatment and safety maintained on unit.  Problem: Education: Goal: Mental status will improve Outcome: Progressing   Problem: Activity: Goal: Interest or engagement in activities will improve Outcome: Progressing   Problem: Coping: Goal: Ability to demonstrate self-control will improve Outcome: Progressing   Problem: Safety: Goal: Periods of time without injury will increase Outcome: Progressing

## 2018-09-05 MED ORDER — ENSURE ENLIVE PO LIQD
237.0000 mL | Freq: Two times a day (BID) | ORAL | Status: DC
  Administered 2018-09-05: 237 mL via ORAL

## 2018-09-05 NOTE — Progress Notes (Signed)
Did not attend group 

## 2018-09-05 NOTE — Progress Notes (Signed)
Recreation Therapy Notes  Date: 10.31.19 Time: 1000 Location: [address] Dayroom   Group Topic: Communication, Team Building, Problem Solving  Goal Area(s) Addresses:  Patient will effectively work with peer towards shared goal.  Patient will identify skill used to make activity successful.  Patient will identify how skills used during activity can be used to reach post d/c goals.   Intervention: STEM Activity   Activity: [employer]. Patients were provided the following materials: 5 drinking straws, 5 rubber bands, 5 paper clips, 2 index cards, 2 drinking cups, and 2 toilet paper rolls. Using the provided materials patients were asked to build a launching mechanisms to launch a ping pong ball approximately 12 feet. Patients were divided into teams of 3-5.   Education: Pharmacist, community, Building control surveyor.   Education Outcome: Acknowledges education/In group clarification offered/Needs additional education.   Clinical Observations/Feedback: Pt did not attend group.    [name], LRT/CTRS         [name] A 09/05/2018 11:57 AM

## 2018-09-05 NOTE — Progress Notes (Signed)
[facility] MD Progress Note  09/05/2018 4:03 PM [name] [name]  MRN:  [id number]  Subjective: Tine reports tearfully. "I came to the hospital because I was feeling paranoia. But, I'm feeling a lot better today. I became tearful after watching something emotional on TV few minutes ago. I'm okay. I just need a computer to send an email to someone. The medications are helping, no side effects".  History as per psychiatric intake: [name] [name] is a 42 y/o F with history of schizoaffective disorder bipolar type (elsewhere: bipolar disorder with psychotic features) who was admitted on IVC (initiated in ED) from [facility] where she presented with paranoia, delusions, agitation, irritability, disorganized behavior, and responding to internal stimuli of AH. Pt had reported that Jesus was trying to kill her. Pt was medically cleared and then transferred to [facility] for additional treatment and stabilization. Pt has recent/relevant history of discharge from [facility] on in August 2019 to [facility] transition care team on regimen on zyprexa, and it is unclear if she has been adherent with her outpatient treatment. Upon initial evaluation, pt shares, "I came in because I was paranoid. I was trying to change my diagnosis over the phone. I needed housing." Pt is pressured, irritable, and rambling during interview. She is able to provide narrative of events after her last discharge, that she was able to establish with an ACT team at [facility] and they helped get her into the [facility], but she had a conflict with staff and a peer, and she was asked to leave. With help from her ACT team she was able to find placement at a group home, but she left after 1 day after she found the conditions to not be clean. She has recently been staying at [facility]. She expresses that she is seeking stable housing such as an apartment but she refuses to continue to work with her ACT team because she disagrees with her diagnosis of bipolar  disorder. She began to perseverate on this topic and grew increasingly irritable when attempting to proceed with the interview. She continued to repeat, "I'm just depressed, I'm not bipolar." Pt was asked about symptoms of feeling that Jesus was trying to kill her, and she explains, "He is trying to kill me! He has for a long time, that's why he brought me here. I'm not bipolar, I'm depressed." Pt denies SI/HI/AH/VH; however, she has been observed on the unit speaking to unseen others. She endorses depressed mood but denies other symptoms of depression. She is irritable and has observable flight of ideas, pressured speech, distractibility, and mood lability. She denies symptoms of OCD and PTSD. She denies all illicit substance use. Attempted to discuss with patient about treatment options. As pt had poor adherence after her last admission, attempted to discuss about potential benefits of long-acting injectable medication, but pt refused to consider this option. She was willing to be restarted on zyprexa at this time, so we will resume that previous medication. As pt continued to escalate in terms of irritability and began to yell at this provider, the interview was concluded.  Today as per evaluation: Today upon evaluation, pt shares, "I'm doing pretty good." She denies any specific concerns. She is sleeping well. Her appetite is good. She denies other physical complaints. She denies SI/HI/AH/VH. She was tearful during the interview, but blamed it on something emotional she saw on TV. She did express being okay. She remains goal-oriented about re-engaging with outpatient services and securing stable housing. She is tolerating her  medications well. Discussed with pt about potential benefits of a long-acting injectable medication such as improved adherence and convenience, and pt voices preference to remain on zyprexa without changes. She also declines offer to add an additional medication such as a mood  stabilizer. Pt is in agreement to continue her current regimen without changes. She had no further questions, comments, or concerns.  Principal Problem: Schizoaffective disorder, bipolar type (HCC) Diagnosis:   Patient Active Problem List   Diagnosis Date Noted  . Schizoaffective disorder, bipolar type (HCC) [F25.0] 09/01/2018   Total Time spent with patient: 15 minutes  Past Psychiatric History: See H&P  Past Medical History:  Past Medical History:  Diagnosis Date  . Arthritis   . Depression    History reviewed. No pertinent surgical history.  Family History: History reviewed. No pertinent family history.  Family Psychiatric  History: See H&P  Social History:  Social History   Substance and Sexual Activity  Alcohol Use Not Currently     Social History   Substance and Sexual Activity  Drug Use Not Currently    Social History   Socioeconomic History  . Marital status: Single    Spouse name: Not on file  . Number of children: Not on file  . Years of education: Not on file  . Highest education level: Not on file  Occupational History  . Not on file  Social Needs  . Financial resource strain: Not on file  . Food insecurity:    Worry: Not on file    Inability: Not on file  . Transportation needs:    Medical: Not on file    Non-medical: Not on file  Tobacco Use  . Smoking status: Never Smoker  . Smokeless tobacco: Never Used  Substance and Sexual Activity  . Alcohol use: Not Currently  . Drug use: Not Currently  . Sexual activity: Not Currently  Lifestyle  . Physical activity:    Days per week: Not on file    Minutes per session: Not on file  . Stress: Not on file  Relationships  . Social connections:    Talks on phone: Not on file    Gets together: Not on file    Attends religious service: Not on file    Active member of club or organization: Not on file    Attends meetings of clubs or organizations: Not on file    Relationship status: Not on file   Other Topics Concern  . Not on file  Social History Narrative  . Not on file   Additional Social History:   Sleep: Good  Appetite:  Good  Current Medications: Current Facility-Administered Medications  Medication Dose Route Frequency Provider Last Rate Last Dose  . acetaminophen (TYLENOL) tablet 650 mg  650 mg Oral Q6H PRN Charm Rings, NP   650 mg at 09/05/18 1419  . alum & mag hydroxide-simeth (MAALOX/MYLANTA) 200-200-20 MG/5ML suspension 30 mL  30 mL Oral Q4H PRN Charm Rings, NP      . feeding supplement (ENSURE ENLIVE) (ENSURE ENLIVE) liquid 237 mL  237 mL Oral BID BM Simon, Spencer E, PA-C   237 mL at 09/05/18 1420  . hydrOXYzine (ATARAX/VISTARIL) tablet 50 mg  50 mg Oral Q6H PRN Micheal Likens, MD   50 mg at 09/04/18 2117  . ibuprofen (ADVIL,MOTRIN) tablet 600 mg  600 mg Oral Q6H PRN Micheal Likens, MD      . LORazepam (ATIVAN) tablet 1 mg  1 mg Oral Q6H  PRN Micheal Likens, MD   1 mg at 09/04/18 2327   Or  . LORazepam (ATIVAN) injection 1 mg  1 mg Intramuscular Q6H PRN Micheal Likens, MD      . magnesium hydroxide (MILK OF MAGNESIA) suspension 30 mL  30 mL Oral Daily PRN Charm Rings, NP      . OLANZapine (ZYPREXA) tablet 10 mg  10 mg Oral QHS Micheal Likens, MD   10 mg at 09/04/18 2117  . OLANZapine zydis (ZYPREXA) disintegrating tablet 5 mg  5 mg Oral Q8H PRN Micheal Likens, MD       Or  . ziprasidone (GEODON) injection 20 mg  20 mg Intramuscular Q12H PRN Micheal Likens, MD      . traZODone (DESYREL) tablet 50 mg  50 mg Oral QHS PRN Charm Rings, NP   50 mg at 09/04/18 2117    Lab Results: No results found for this or any previous visit (from the past 48 hour(s)).  Blood Alcohol level:  Lab Results  Component Value Date   ETH <10 08/31/2018   ETH <10 08/27/2018    Metabolic Disorder Labs: Lab Results  Component Value Date   HGBA1C 4.7 (L) 06/26/2018   MPG 88.19 06/26/2018   Lab  Results  Component Value Date   PROLACTIN 28.9 (H) 06/26/2018   Lab Results  Component Value Date   CHOL 121 06/26/2018   TRIG 128 06/26/2018   HDL 38 (L) 06/26/2018   CHOLHDL 3.2 06/26/2018   VLDL 26 06/26/2018   LDLCALC 57 06/26/2018    Physical Findings: AIMS: Facial and Oral Movements Muscles of Facial Expression: Minimal Lips and Perioral Area: Minimal Jaw: None, normal Tongue: Minimal,Extremity Movements Upper (arms, wrists, hands, fingers): None, normal Lower (legs, knees, ankles, toes): None, normal, Trunk Movements Neck, shoulders, hips: None, normal, Overall Severity Severity of abnormal movements (highest score from questions above): None, normal Incapacitation due to abnormal movements: None, normal Patient's awareness of abnormal movements (rate only patient's report): No Awareness, Dental Status Current problems with teeth and/or dentures?: No Does patient usually wear dentures?: No  CIWA:  CIWA-Ar Total: 0 COWS:  COWS Total Score: 2  Musculoskeletal: Strength & Muscle Tone: within normal limits Gait & Station: normal Patient leans: N/A  Psychiatric Specialty Exam: Physical Exam  Nursing note and vitals reviewed.   Review of Systems  Constitutional: Negative for chills and fever.  Respiratory: Negative for cough and shortness of breath.   Cardiovascular: Negative for chest pain.  Gastrointestinal: Negative for abdominal pain, heartburn, nausea and vomiting.  Psychiatric/Behavioral: Negative for depression, hallucinations and suicidal ideas. The patient is not nervous/anxious and does not have insomnia.     Blood pressure 110/62, pulse (!) 113, temperature 98 F (36.7 C), temperature source Oral, resp. rate 16, height 4\' 10"  (1.473 m), weight 47.2 kg, last menstrual period 08/13/2018.Body mass index is 21.74 kg/m.  General Appearance: Casual and Fairly Groomed  Eye Contact:  Good  Speech:  Clear and Coherent and Normal Rate  Volume:  Normal  Mood:   Euthymic  Affect:  Appropriate and Congruent  Thought Process:  Coherent and Goal Directed  Orientation:  Full (Time, Place, and Person)  Thought Content:  Logical  Suicidal Thoughts:  No  Homicidal Thoughts:  No  Memory:  Immediate;   Fair Recent;   Fair Remote;   Fair  Judgement:  Fair  Insight:  Lacking  Psychomotor Activity:  Normal  Concentration:  Concentration: Fair  Recall:  Jennelle Human of Knowledge:  Fair  Language:  Fair  Akathisia:  No  Handed:    AIMS (if indicated):     Assets:  Resilience Social Support  ADL's:  Intact  Cognition:  WNL  Sleep:  Number of Hours: 6   Treatment Plan Summary: Daily contact with patient to assess and evaluate symptoms and progress in treatment and Medication management   -Continue inpatient hospitalization.  -Will continue today 09/05/2018 plan as below except where it is noted.  -Schizoaffective disorder, bipolar type   -Continue zyprexa 10mg  po qhs  -anxiety  -Continue vistaril 50mg  po q6h prn anxiety  -agitation    -Continue ativan 1mg  po/IM q6h prn agitation   -Continue zydis 5mg  po q8h prn agitation   -Continue geodon 20mg  IM q12h prn severe agitation  -insomnia   -Continue trazodone 50mg  po qhs prn insomnia  -Encourage participation in groups and therapeutic milieu  -disposition planning will be ongoing  Armandina Stammer, NP, PMHNP, FNP-BC 09/05/2018, 4:03 PMPatient ID: [name] [name], female   DOB: 1976-02-01, 42 y.o.   MRN: [id number]

## 2018-09-05 NOTE — Progress Notes (Signed)
Patient has been irritable and agitated.  Patient is noted to be talking to unseen other.  Patient has been noted to have outburst of yelling in her room.    Assess patient for safety, offer medications as prescribed, engage patient in 1:1 staff talks.   Patient needed much support to remain safety.  Continue to monitor as planned.

## 2018-09-05 NOTE — [facility] Group Notes (Signed)
[facility] LCSW Group Therapy Note  Date/Time: 09/05/18, 1345  Type of Therapy/Topic:  Group Therapy:  Balance in Life  Participation Level:  minimal  Description of Group:    This group will address the concept of balance and how it feels and looks when one is unbalanced. Patients will be encouraged to process areas in their lives that are out of balance, and identify reasons for remaining unbalanced. Facilitators will guide patients utilizing problem- solving interventions to address and correct the stressor making their life unbalanced. Understanding and applying boundaries will be explored and addressed for obtaining  and maintaining a balanced life. Patients will be encouraged to explore ways to assertively make their unbalanced needs known to significant others in their lives, using other group members and facilitator for support and feedback.  Therapeutic Goals: 1. Patient will identify two or more emotions or situations they have that consume much of in their lives. 2. Patient will identify signs/triggers that life has become out of balance:  3. Patient will identify two ways to set boundaries in order to achieve balance in their lives:  4. Patient will demonstrate ability to communicate their needs through discussion and/or role plays  Summary of Patient Progress:Pt mostly quiet during group discussion.  Pt did respond to CSW questions and identified family as as area that is out of balance.  Pt pleasant when spoken to but did not otherwise participate in the group discussion.           Therapeutic Modalities:   Cognitive Behavioral Therapy Solution-Focused Therapy Assertiveness Training  Daleen Squibb, [place]

## 2018-09-05 NOTE — Progress Notes (Signed)
Patient has acrylic nails on her finger.  Patient is complaining of nail pain and asked the nurse to remove the nail.  Nurse informed patient that she could not remove the nail from her finger because she did not have the appropriate tools to remove the nail.  Nurse offered the patient pain medication the help relieve the pain but patient refused and began yelling "you are not a real nurse you are trying to cause me more pain.  You are upset because you are ugly and you are fake nurse.  You poisoned my food."  Nurse informed the patient that she would no longer engaged patient in conversation but if she would like pain medications I will give those to you.  Patient then went into the day room and calmed.

## 2018-09-05 NOTE — Progress Notes (Addendum)
CSW spoke to [place] at [facility].  Pt does continue to have a bed there and can return when she discharges tomorrow. Garner Nash, MSW, LCSW Clinical Social Worker 09/05/2018 9:29 AM   CSW spoke to [place] on [facility].  They can pick pt up from [facility] when discharged tomorrow.  Please call crisis number when she is ready: [phone].  CSW confirmed to [facility] that pt can return to [facility].  Artist Pais will arranged medication appt for pt herself and does not need CSW to schedule appt. Garner Nash, MSW, LCSW Clinical Social Worker 09/05/2018 12:37 PM

## 2018-09-05 NOTE — Progress Notes (Signed)
D: Pt denies SI/HI/AVH. Pt is pleasant and cooperative. Pt continues to respond to internal stimuli , but pt was not as pronounced talking to people not seen by others this evening. Pt was seen talking , but pt was not loud and disruptive this evening.  A: Pt was offered support and encouragement. Pt was given scheduled medications. Pt was encourage to attend groups. Q 15 minute checks were done for safety.  R:Pt attends groups and interacts well with peers and staff. Pt is taking medication. Pt has no complaints.Pt receptive to treatment and safety maintained on unit.  Problem: Coping: Goal: Coping ability will improve Outcome: Progressing   Problem: Safety: Goal: Ability to disclose and discuss suicidal ideas will improve Outcome: Progressing   Problem: Education: Goal: Will be free of psychotic symptoms Outcome: Progressing

## 2018-09-06 MED ORDER — TRAZODONE HCL 50 MG PO TABS: 50.0000 mg | ORAL_TABLET | Freq: Every evening | ORAL | 0 refills | Status: DC | PRN

## 2018-09-06 MED ORDER — OLANZAPINE 10 MG PO TABS: 10.0000 mg | ORAL_TABLET | Freq: Every day | ORAL | 0 refills | Status: DC

## 2018-09-06 MED ORDER — HYDROXYZINE HCL 50 MG PO TABS: 50.0000 mg | ORAL_TABLET | Freq: Four times a day (QID) | ORAL | 0 refills | Status: DC | PRN

## 2018-09-06 NOTE — Progress Notes (Signed)
  [facility] Adult Case Management Discharge Plan :  Will you be returning to the same living situation after discharge:  Yes,  [facility] shelter At discharge, do you have transportation home?: Yes,  Community Support team Do you have the ability to pay for your medications: Yes,  medicare  Release of information consent forms completed and in the chart;  Patient's signature needed at discharge.  Patient to Follow up at: Follow-up Information    [facility] Follow up on 09/06/2018.   Why:  Your community support team will pick you up from the hospital and resume services immediately.  Contact information: [address] [place] [address] [phone]           Next level of care provider has access to [facility] Link:no  Safety Planning and Suicide Prevention discussed: No. Pt declined consent.    Have you used any form of tobacco in the last 30 days? (Cigarettes, Smokeless Tobacco, Cigars, and/or Pipes): No  Has patient been referred to the Quitline?: N/A patient is not a smoker  Patient has been referred for addiction treatment: N/A  Lorri Frederick, LCSW 09/06/2018, 10:27 AM

## 2018-09-06 NOTE — Plan of Care (Signed)
Discharge Note  Patient verbalizes readiness for discharge. Follow up plan explained, AVS, Transition record and SRA given. Prescriptions and teaching provided. Belongings returned and signed for. Suicide safety plan completed and signed. Patient verbalizes understanding. Patient denies SI/HI and assures this Clinical research associate he will seek assistance should that change. Patient discharged to lobby where case worker was waiting for transport.  Problem: Safety: Goal: Periods of time without injury will increase Outcome: Adequate for Discharge   Problem: Education: Goal: Knowledge of [place] [facility] Education information/materials will improve Outcome: Adequate for Discharge Goal: Emotional status will improve Outcome: Adequate for Discharge Goal: Mental status will improve Outcome: Adequate for Discharge Goal: Verbalization of understanding the information provided will improve Outcome: Adequate for Discharge   Problem: Activity: Goal: Interest or engagement in activities will improve Outcome: Adequate for Discharge Goal: Sleeping patterns will improve Outcome: Adequate for Discharge   Problem: Coping: Goal: Ability to verbalize frustrations and anger appropriately will improve Outcome: Adequate for Discharge Goal: Ability to demonstrate self-control will improve Outcome: Adequate for Discharge   Problem: Health Behavior/Discharge Planning: Goal: Identification of resources available to assist in meeting health care needs will improve Outcome: Adequate for Discharge Goal: Compliance with treatment plan for underlying cause of condition will improve Outcome: Adequate for Discharge   Problem: Physical Regulation: Goal: Ability to maintain clinical measurements within normal limits will improve Outcome: Adequate for Discharge   Problem: Safety: Goal: Periods of time without injury will increase Outcome: Adequate for Discharge   Problem: Education: Goal: Utilization of techniques to  improve thought processes will improve Outcome: Adequate for Discharge Goal: Knowledge of the prescribed therapeutic regimen will improve Outcome: Adequate for Discharge   Problem: Activity: Goal: Interest or engagement in leisure activities will improve Outcome: Adequate for Discharge Goal: Imbalance in normal sleep/wake cycle will improve Outcome: Adequate for Discharge   Problem: Coping: Goal: Coping ability will improve Outcome: Adequate for Discharge Goal: Will verbalize feelings Outcome: Adequate for Discharge   Problem: Health Behavior/Discharge Planning: Goal: Ability to make decisions will improve Outcome: Adequate for Discharge Goal: Compliance with therapeutic regimen will improve Outcome: Adequate for Discharge   Problem: Role Relationship: Goal: Will demonstrate positive changes in social behaviors and relationships Outcome: Adequate for Discharge   Problem: Safety: Goal: Ability to disclose and discuss suicidal ideas will improve Outcome: Adequate for Discharge Goal: Ability to identify and utilize support systems that promote safety will improve Outcome: Adequate for Discharge   Problem: Self-Concept: Goal: Will verbalize positive feelings about self Outcome: Adequate for Discharge Goal: Level of anxiety will decrease Outcome: Adequate for Discharge   Problem: Activity: Goal: Will verbalize the importance of balancing activity with adequate rest periods Outcome: Adequate for Discharge   Problem: Education: Goal: Will be free of psychotic symptoms Outcome: Adequate for Discharge Goal: Knowledge of the prescribed therapeutic regimen will improve Outcome: Adequate for Discharge   Problem: Coping: Goal: Coping ability will improve Outcome: Adequate for Discharge Goal: Will verbalize feelings Outcome: Adequate for Discharge   Problem: Health Behavior/Discharge Planning: Goal: Compliance with prescribed medication regimen will improve Outcome:  Adequate for Discharge   Problem: Nutritional: Goal: Ability to achieve adequate nutritional intake will improve Outcome: Adequate for Discharge   Problem: Role Relationship: Goal: Ability to communicate needs accurately will improve Outcome: Adequate for Discharge Goal: Ability to interact with others will improve Outcome: Adequate for Discharge   Problem: Safety: Goal: Ability to redirect hostility and anger into socially appropriate behaviors will improve Outcome: Adequate for Discharge Goal:  Ability to remain free from injury will improve Outcome: Adequate for Discharge   Problem: Self-Care: Goal: Ability to participate in self-care as condition permits will improve Outcome: Adequate for Discharge   Problem: Self-Concept: Goal: Will verbalize positive feelings about self Outcome: Adequate for Discharge

## 2018-09-06 NOTE — [facility] Group Notes (Signed)
Date: 09/06/18, 1315  Type of Therapy and Topic: Chaplain group, "Hope is.." Chaplain engaged group in discussion about hope and what it looks like in each patients life and current situation.  Participation level:minimal  Modes of Intervention: Discussion, Education and Socialization  Summary of Progress/Problems:Pt did not participate in the group discussion today and continued to talk softly to herself for much of group.  Pt finally left early.   Lorri Frederick, LCSW 09/06/2018 1:36 PM  [facility] LCSW Group Therapy Note

## 2018-09-06 NOTE — Progress Notes (Signed)
Recreation Therapy Notes  Date: 11.1.19 Time: 1000 Location: 500 Hall  Group Topic: Communication  Goal Area(s) Addresses:  Patient will effectively communicate with peers in group.  Patient will effectively communicate to achieve shared group goal. Patient will identify techniques that made activity effective for group.   Intervention: Round rubber discs  Activity: Sharks in the Water.  Each person was given [name] rubber disc and one disc was given to the group.  Patients were to use the discs to move the group from the starting point to the end of the hall and back.  If anyone stepped off their disc, the group would have to start over.  Education: Communication, Discharge Planning  Education Outcome: Acknowledges understanding/In group clarification offered/Needs additional education.   Clinical Observations/Feedback: Pt did not attend group.    Alver Leete, LRT/CTRS         [name] [name] 09/06/2018 11:23 AM 

## 2018-09-06 NOTE — [facility] Suicide Risk Assessment (Signed)
[facility] Discharge Suicide Risk Assessment   Principal Problem: Schizoaffective disorder, bipolar type [facility]) Discharge Diagnoses:  Patient Active Problem List   Diagnosis Date Noted  . Schizoaffective disorder, bipolar type (HCC) [F25.0] 09/01/2018   Total Time spent with patient: 30 minutes  Psychiatric Specialty Exam:   Blood pressure 110/62, pulse (!) 113, temperature 98 F (36.7 C), temperature source Oral, resp. rate 16, height 4\' 10"  (1.473 m), weight 47.2 kg, last menstrual period 08/13/2018.Body mass index is 21.74 kg/m.   Mental Status Per Nursing Assessment::   On Admission:  NA  Demographic Factors:  Low socioeconomic status, Living alone and Unemployed  Loss Factors: Financial problems/change in socioeconomic status  Historical Factors: Family history of mental illness or substance abuse and Impulsivity  Risk Reduction Factors:   Positive social support, Positive therapeutic relationship and Positive coping skills or problem solving skills  Continued Clinical Symptoms:  Bipolar Disorder:   Mixed State Schizophrenia:   Paranoid or undifferentiated type  Cognitive Features That Contribute To Risk:  None    Suicide Risk:  Minimal: No identifiable suicidal ideation.  Patients presenting with no risk factors but with morbid ruminations; may be classified as minimal risk based on the severity of the depressive symptoms  Follow-up Information    [facility] Follow up on 09/06/2018.   Why:  Your community support team will pick you up from the hospital and resume services immediately.  Contact information: [address] [place] [address] [phone]          Plan Of Care/Follow-up recommendations:  Activity:  as tolerated Diet:  normal Tests:  NA Other:  see above for DC plan  Micheal Likens, MD 09/06/2018, 7:55 AM

## 2018-09-06 NOTE — Progress Notes (Signed)
Recreation Therapy Notes  INPATIENT RECREATION TR PLAN  Patient Details Name: [name] [name] MRN: [id number] DOB: 1975-11-27 Today's Date: 09/06/2018  Rec Therapy Plan Is patient appropriate for Therapeutic Recreation?: Yes Treatment times per week: about 3 days Estimated Length of Stay: 5-7 days TR Treatment/Interventions: Group participation (Comment)  Discharge Criteria Pt will be discharged from therapy if:: Discharged Treatment plan/goals/alternatives discussed and agreed upon by:: Patient/family  Discharge Summary Short term goals set: See patient care plan Short term goals met: Not met Progress toward goals comments: Groups attended Which groups?: Stress management, Leisure education Reason goals not met: Pt unable to focus in group sessions. Therapeutic equipment acquired: N/A Reason patient discharged from therapy: Discharge from hospital Pt/family agrees with progress & goals achieved: Yes Date patient discharged from therapy: 09/06/18    Victorino Sparrow, LRT/CTRS  Ria Comment, Allex Lapoint A 09/06/2018, 11:00 AM

## 2018-09-06 NOTE — Discharge Summary (Signed)
Physician Discharge Summary Note  Patient:  [name] [name] is an 42 y.o., female MRN:  [id number] DOB:  10-Mar-1976 Patient phone:  [phone] (home)  Patient address:   [address] [place] [address],  Total Time spent with patient: 30 minutes  Date of Admission:  09/02/2018 Date of Discharge: 09/06/2018  Reason for Admission: worsening depression, anxiety, and psychosis  Principal Problem: Schizoaffective disorder, bipolar type [facility]) Discharge Diagnoses: Patient Active Problem List   Diagnosis Date Noted  . Schizoaffective disorder, bipolar type (HCC) [F25.0] 09/01/2018    Past Psychiatric History: see H&P  Past Medical History:  Past Medical History:  Diagnosis Date  . Arthritis   . Depression    History reviewed. No pertinent surgical history. Family History: History reviewed. No pertinent family history. Family Psychiatric  History: see H&P Social History:  Social History   Substance and Sexual Activity  Alcohol Use Not Currently     Social History   Substance and Sexual Activity  Drug Use Not Currently    Social History   Socioeconomic History  . Marital status: Single    Spouse name: Not on file  . Number of children: Not on file  . Years of education: Not on file  . Highest education level: Not on file  Occupational History  . Not on file  Social Needs  . Financial resource strain: Not on file  . Food insecurity:    Worry: Not on file    Inability: Not on file  . Transportation needs:    Medical: Not on file    Non-medical: Not on file  Tobacco Use  . Smoking status: Never Smoker  . Smokeless tobacco: Never Used  Substance and Sexual Activity  . Alcohol use: Not Currently  . Drug use: Not Currently  . Sexual activity: Not Currently  Lifestyle  . Physical activity:    Days per week: Not on file    Minutes per session: Not on file  . Stress: Not on file  Relationships  . Social connections:    Talks on phone: Not on file    Gets  together: Not on file    Attends religious service: Not on file    Active member of club or organization: Not on file    Attends meetings of clubs or organizations: Not on file    Relationship status: Not on file  Other Topics Concern  . Not on file  Social History Narrative  . Not on file    Hospital Course:     History as per psychiatric intake: [name] [name] is a 42 y/o F with history of schizoaffective disorder bipolar type (elsewhere: bipolar disorder with psychotic features) who was admitted on IVC (initiated in ED) from [facility] where she presented with paranoia, delusions, agitation, irritability, disorganized behavior, and responding to internal stimuli of AH. Pt had reported that [name] [name] was trying to kill her. Pt was medically cleared and then transferred to [facility] for additional treatment and stabilization. Pt has recent/relevant history of discharge from [facility] on in August 2019 to [facility] transition care team on regimen on zyprexa, and it is unclear if she has been adherent with her outpatient treatment. Upon initial evaluation, pt shares, "I came in because I was paranoid. I was trying to change my diagnosis over the phone. I needed housing." Pt is pressured, irritable, and rambling during interview. She is able to provide narrative of events after her last discharge, that she was able to establish with an ACT  team at [facility] and they helped get her into the [facility], but she had a conflict with staff and a peer, and she was asked to leave. With help from her ACT team she was able to find placement at a group home, but she left after 1 day after she found the conditions to not be clean. She has recently been staying at [facility]. She expresses that she is seeking stable housing such as an apartment but she refuses to continue to work with her ACT team because she disagrees with her diagnosis of bipolar disorder. She began to perseverate on this topic and grew  increasingly irritable when attempting to proceed with the interview. She continued to repeat, "I'm just depressed, I'm not bipolar." Pt was asked about symptoms of feeling that [name] [name] was trying to kill her, and she explains, "He is trying to kill me! He has for a long time, that's why he brought me here. I'm not bipolar, I'm depressed." Pt denies SI/HI/AH/VH; however, she has been observed on the unit speaking to unseen others. She endorses depressed mood but denies other symptoms of depression. She is irritable and has observable flight of ideas, pressured speech, distractibility, and mood lability. She denies symptoms of OCD and PTSD. She denies all illicit substance use. Attempted to discuss with patient about treatment options. As pt had poor adherence after her last admission, attempted to discuss about potential benefits of long-acting injectable medication, but pt refused to consider this option. She was willing to be restarted on zyprexa at this time, so we will resume that previous medication. As pt continued to escalate in terms of irritability and began to yell at this provider, the interview was concluded.  Today as per evaluation: Today upon evaluation, pt shares, "I'm doing good- ready to go." She denies any specific concerns. She is sleeping well. Her appetite is good. She denies other physical complaints. She denies SI/HI/AH/VH. She is tolerating her medications well, and she is in agreement to continue her current regimen without changes. She plans to follow up at [facility] and she will also work with transition care team. She was able to engage in safety planning including plan to return to [facility] or contact emergency services if she feels unable to maintain her own safety or the safety of others. Pt had no further questions, comments, or concerns.   The patient is at low risk of imminent suicide. Patient denied thoughts, intent, or plan for harm to self or others,  expressed significant future orientation, and expressed an ability to mobilize assistance for her needs. She is presently void of any contributing psychiatric symptoms, cognitive difficulties, or substance use which would elevate her risk for lethality. Chronic risk for lethality is elevated in light of poor social support, poor adherence, and chronic symptoms of paranoia/disorganizatoin. The chronic risk is presently mitigated by her ongoing desire and engagement in [facility] treatment and mobilization of support from family and friends. Chronic risk may elevate if she experiences any significant loss or worsening of symptoms, which can be managed and monitored through outpatient providers. At this time, acute risk for lethality is low and she is stable for ongoing outpatient management.    Modifiable risk factors were addressed during this hospitalization through appropriate pharmacotherapy and establishment of outpatient follow-up treatment. Some risk factors for suicide are situational (i.e. Unstable social support) or related personality pathology (i.e. Poor coping mechanisms) and thus cannot be further mitigated by continued hospitalization in this setting.  Physical Findings: AIMS: Facial and Oral Movements Muscles of Facial Expression: Minimal Lips and Perioral Area: Minimal Jaw: None, normal Tongue: Minimal,Extremity Movements Upper (arms, wrists, hands, fingers): None, normal Lower (legs, knees, ankles, toes): None, normal, Trunk Movements Neck, shoulders, hips: None, normal, Overall Severity Severity of abnormal movements (highest score from questions above): None, normal Incapacitation due to abnormal movements: None, normal Patient's awareness of abnormal movements (rate only patient's report): No Awareness, Dental Status Current problems with teeth and/or dentures?: No Does patient usually wear dentures?: No  CIWA:  CIWA-Ar Total: 0 COWS:  COWS Total Score: 2  Musculoskeletal: Strength  & Muscle Tone: within normal limits Gait & Station: normal Patient leans: N/A  Psychiatric Specialty Exam: Physical Exam  Nursing note and vitals reviewed.   Review of Systems  Constitutional: Negative for chills and fever.  Respiratory: Negative for cough and shortness of breath.   Cardiovascular: Negative for chest pain.  Gastrointestinal: Negative for abdominal pain, heartburn, nausea and vomiting.  Psychiatric/Behavioral: Negative for depression, hallucinations and suicidal ideas. The patient is not nervous/anxious and does not have insomnia.     Blood pressure 115/78, pulse 95, temperature 98.5 F (36.9 C), temperature source Oral, resp. rate 20, height 4\' 10"  (1.473 m), weight 47.2 kg, last menstrual period 08/13/2018.Body mass index is 21.74 kg/m.  General Appearance: Casual and Fairly Groomed  Eye Contact:  Good  Speech:  Clear and Coherent and Normal Rate  Volume:  Normal  Mood:  Euthymic  Affect:  Flat  Thought Process:  Coherent and Goal Directed  Orientation:  Full (Time, Place, and Person)  Thought Content:  Logical  Suicidal Thoughts:  No  Homicidal Thoughts:  No  Memory:  Immediate;   Fair Recent;   Fair Remote;   Fair  Judgement:  Fair  Insight:  Lacking  Psychomotor Activity:  Normal  Concentration:  Concentration: Fair  Recall:  Fair  Fund of Knowledge:  Fair  Language:  Fair  Akathisia:  No  Handed:    AIMS (if indicated):     Assets:  Resilience Social Support  ADL's:  Intact  Cognition:  WNL  Sleep:  Number of Hours: 4.75     Have you used any form of tobacco in the last 30 days? (Cigarettes, Smokeless Tobacco, Cigars, and/or Pipes): No  Has this patient used any form of tobacco in the last 30 days? (Cigarettes, Smokeless Tobacco, Cigars, and/or Pipes) Yes, Yes, A prescription for an FDA-approved tobacco cessation medication was offered at discharge and the patient refused  Blood Alcohol level:  Lab Results  Component Value Date   [facility] <10  08/31/2018   ETH <10 08/27/2018    Metabolic Disorder Labs:  Lab Results  Component Value Date   HGBA1C 4.7 (L) 06/26/2018   MPG 88.19 06/26/2018   Lab Results  Component Value Date   PROLACTIN 28.9 (H) 06/26/2018   Lab Results  Component Value Date   CHOL 121 06/26/2018   TRIG 128 06/26/2018   HDL 38 (L) 06/26/2018   CHOLHDL 3.2 06/26/2018   VLDL 26 06/26/2018   LDLCALC 57 06/26/2018    See Psychiatric Specialty Exam and Suicide Risk Assessment completed by Attending Physician prior to discharge.  Discharge destination:  Home  Is patient on multiple antipsychotic therapies at discharge:  No   Has Patient had three or more failed trials of antipsychotic monotherapy by history:  No  Recommended Plan for Multiple Antipsychotic Therapies: NA   Allergies as of 09/06/2018   No  Known Allergies     Medication List    TAKE these medications     Indication  hydrOXYzine 50 MG tablet Commonly known as:  ATARAX/VISTARIL Take 1 tablet (50 mg total) by mouth every 6 (six) hours as needed for anxiety.  Indication:  Feeling Anxious   OLANZapine 10 MG tablet Commonly known as:  ZYPREXA Take 1 tablet (10 mg total) by mouth at bedtime. What changed:    when to take this  additional instructions  Another medication with the same name was removed. Continue taking this medication, and follow the directions you see here.  Indication:  schizoaffective disorder bipolar type   traZODone 50 MG tablet Commonly known as:  DESYREL Take 1 tablet (50 mg total) by mouth at bedtime as needed for sleep. What changed:  Another medication with the same name was removed. Continue taking this medication, and follow the directions you see here.  Indication:  Trouble Sleeping      Follow-up Information    [facility] Follow up on 09/06/2018.   Why:  Your community support team will pick you up from the hospital and resume services immediately.  Contact information: [address] [place] [address] [phone]           Follow-up recommendations:  Activity:  as tolerated Diet:  normal Tests:  NA Other:  see above for DC plan  Comments:    Signed: Micheal Likens, MD 09/06/2018, 10:41 AM

## 2018-09-06 NOTE — [facility] Suicide Risk Assessment (Signed)
[facility] INPATIENT:  Family/Significant Other Suicide Prevention Education  Suicide Prevention Education:  Patient Refusal for Family/Significant Other Suicide Prevention Education: The patient [name] [name] has refused to provide written consent for family/significant other to be provided Family/Significant Other Suicide Prevention Education during admission and/or prior to discharge.  Physician notified.  Lorri Frederick, LCSW 09/06/2018, 9:19 AM

## 2018-09-06 NOTE — Plan of Care (Signed)
Pt was unable to focus on activity to learn stress management techniques at completion of recreation therapy group sessions.   [name], LRT/CTRS

## 2018-09-08 ENCOUNTER — Emergency Department (HOSPITAL_COMMUNITY)
Admission: EM | Admit: 2018-09-08 | Discharge: 2018-09-08 | Discharge status: Against Medical Advice | Payer: Medicare Other | Attending: Emergency Medicine | Admitting: Emergency Medicine

## 2018-09-08 DIAGNOSIS — M79606 Pain in leg, unspecified: Secondary | ICD-10-CM

## 2018-09-08 DIAGNOSIS — R52 Pain, unspecified: Secondary | ICD-10-CM | POA: Diagnosis not present

## 2018-09-08 NOTE — ED Triage Notes (Signed)
Pt brought in by [facility], pt initially complained of leg pain but also here for psychiatric evaluation.

## 2018-09-08 NOTE — ED Notes (Signed)
Bed: [facility] Expected date:  Expected time:  Means of arrival:  Comments: 

## 2018-09-08 NOTE — ED Notes (Signed)
Pt arrived with EMS, would not go in room 4, walked to lobby and called cab, [employer] arrested pt for misuse of 911

## 2018-09-12 ENCOUNTER — Other Ambulatory Visit: Payer: Self-pay

## 2018-09-12 ENCOUNTER — Emergency Department (HOSPITAL_COMMUNITY)
Admission: EM | Admit: 2018-09-12 | Discharge: 2018-09-12 | Disposition: A | Discharge status: Home / Self Care | Payer: Medicare Other | Attending: Emergency Medicine | Admitting: Emergency Medicine

## 2018-09-12 ENCOUNTER — Encounter (HOSPITAL_COMMUNITY): Payer: Self-pay

## 2018-09-12 DIAGNOSIS — R112 Nausea with vomiting, unspecified: Secondary | ICD-10-CM | POA: Diagnosis not present

## 2018-09-12 DIAGNOSIS — Z79899 Other long term (current) drug therapy: Secondary | ICD-10-CM | POA: Insufficient documentation

## 2018-09-12 DIAGNOSIS — R1030 Lower abdominal pain, unspecified: Secondary | ICD-10-CM

## 2018-09-12 LAB — BASIC METABOLIC PANEL
Anion gap: 7 (ref 5–15)
BUN: 13 mg/dL (ref 6–20)
CALCIUM: 9.1 mg/dL (ref 8.9–10.3)
CO2: 29 mmol/L (ref 22–32)
Chloride: 102 mmol/L (ref 98–111)
Creatinine, Ser: 0.54 mg/dL (ref 0.44–1.00)
GFR calc non Af Amer: >60 mL/min (ref >60)
GLUCOSE: 91 mg/dL (ref 70–99)
POTASSIUM: 3.5 mmol/L (ref 3.5–5.1)
SODIUM: 138 mmol/L (ref 135–145)

## 2018-09-12 LAB — PREGNANCY, URINE: PREG TEST UR: NEGATIVE

## 2018-09-12 LAB — CBC WITH DIFFERENTIAL/PLATELET
ABS IMMATURE GRANULOCYTES: 0.01 10*3/uL (ref 0.00–0.07)
BASOS PCT: 0 %
Basophils Absolute: 0 10*3/uL (ref 0.0–0.1)
EOS ABS: 0.1 10*3/uL (ref 0.0–0.5)
Eosinophils Relative: 2 %
HCT: 35.6 % — ABNORMAL LOW (ref 36.0–46.0)
Hemoglobin: 11.1 g/dL — ABNORMAL LOW (ref 12.0–15.0)
IMMATURE GRANULOCYTES: 0 %
Lymphocytes Relative: 25 %
Lymphs Abs: 1.4 10*3/uL (ref 0.7–4.0)
MCH: 28.2 pg (ref 26.0–34.0)
MCHC: 31.2 g/dL (ref 30.0–36.0)
MCV: 90.4 fL (ref 80.0–100.0)
MONOS PCT: 8 %
Monocytes Absolute: 0.5 10*3/uL (ref 0.1–1.0)
NEUTROS ABS: 3.5 10*3/uL (ref 1.7–7.7)
NEUTROS PCT: 65 %
NRBC: 0 % (ref 0.0–0.2)
PLATELETS: 350 10*3/uL (ref 150–400)
RBC: 3.94 MIL/uL (ref 3.87–5.11)
RDW: 13.7 % (ref 11.5–15.5)
WBC: 5.5 10*3/uL (ref 4.0–10.5)

## 2018-09-12 LAB — URINALYSIS, ROUTINE W REFLEX MICROSCOPIC
Bilirubin Urine: NEGATIVE
GLUCOSE, UA: NEGATIVE mg/dL
Hgb urine dipstick: NEGATIVE
KETONES UR: NEGATIVE mg/dL
LEUKOCYTES UA: NEGATIVE
NITRITE: NEGATIVE
PROTEIN: NEGATIVE mg/dL
Specific Gravity, Urine: 1.002 — ABNORMAL LOW (ref 1.005–1.030)
pH: 7 (ref 5.0–8.0)

## 2018-09-12 MED ORDER — ONDANSETRON 4 MG PO TBDP: 4.0000 mg | ORAL_TABLET | Freq: Three times a day (TID) | ORAL | 0 refills | Status: DC | PRN

## 2018-09-12 MED ORDER — ONDANSETRON HCL 4 MG/2ML IJ SOLN: 4.0000 mg | Freq: Once | INTRAMUSCULAR | Status: DC

## 2018-09-12 MED ORDER — SODIUM CHLORIDE 0.9 % IV BOLUS: 500.0000 mL | Freq: Once | INTRAVENOUS | Status: DC

## 2018-09-12 NOTE — ED Notes (Signed)
Pt uncooperative with Angelica Chessman RN attempting IV. Unable to start.

## 2018-09-12 NOTE — ED Provider Notes (Signed)
[facility]-EMERGENCY DEPT Provider Note   CSN: [id number] Arrival date & time: 09/12/18  1047     History   Chief Complaint Chief Complaint  Patient presents with  . Abdominal Pain    HPI [name] [name] is a 42 y.o. female.  HPI Patient presents with lower abdominal pain.  Began acutely around half hour prior to arrival.  [place].  Has had some vomiting.  No fevers.  Denies possibility of pregnancy.  No diarrhea.  Some continued pain.  No sick contacts. Past Medical History:  Diagnosis Date  . Arthritis   . Depression     Patient Active Problem List   Diagnosis Date Noted  . Schizoaffective disorder, bipolar type (HCC) 09/01/2018    No past surgical history on file.   OB History   None      Home Medications    Prior to Admission medications   Medication Sig Start Date End Date Taking? Authorizing Provider  hydrOXYzine (ATARAX/VISTARIL) 50 MG tablet Take 1 tablet (50 mg total) by mouth every 6 (six) hours as needed for anxiety. 09/06/18   Micheal Likens, MD  OLANZapine (ZYPREXA) 10 MG tablet Take 1 tablet (10 mg total) by mouth at bedtime. 09/06/18   Micheal Likens, MD  ondansetron (ZOFRAN-ODT) 4 MG disintegrating tablet Take 1 tablet (4 mg total) by mouth every 8 (eight) hours as needed for nausea or vomiting. 09/12/18   Benjiman Core, MD  traZODone (DESYREL) 50 MG tablet Take 1 tablet (50 mg total) by mouth at bedtime as needed for sleep. 09/06/18   Micheal Likens, MD    Family History No family history on file.  Social History Social History   Tobacco Use  . Smoking status: Never Smoker  . Smokeless tobacco: Never Used  Substance Use Topics  . Alcohol use: Not Currently  . Drug use: Not Currently     Allergies   Patient has no known allergies.   Review of Systems Review of Systems  Constitutional: Positive for appetite change.  HENT: Negative for congestion.   Respiratory: Negative for shortness  of breath.   Cardiovascular: Negative for chest pain.  Gastrointestinal: Positive for abdominal pain, nausea and vomiting. Negative for blood in stool and constipation.  Genitourinary: Negative for flank pain.  Musculoskeletal: Negative for back pain.  Skin: Negative for rash.  Neurological: Negative for weakness.  Hematological: Negative for adenopathy.  Psychiatric/Behavioral: Negative for confusion.     Physical Exam Updated Vital Signs BP 128/88   Pulse 90   Temp 97.9 F (36.6 C) (Oral)   Resp 18   Ht 4\' 10"  (1.473 m)   Wt 51.7 kg   LMP 08/13/2018   SpO2 98%   BMI 23.82 kg/m   Physical Exam  Constitutional: She appears well-developed.  HENT:  Head: Normocephalic.  Eyes: EOM are normal.  Cardiovascular: Regular rhythm.  Pulmonary/Chest: Breath sounds normal.  Abdominal: Normal appearance.  Mild lower abdominal tenderness without rebound or guarding.  Neurological: She is alert.  Skin: Skin is warm. Capillary refill takes less than 2 seconds.     ED Treatments / Results  Labs (all labs ordered are listed, but only abnormal results are displayed) Labs Reviewed  URINALYSIS, ROUTINE W REFLEX MICROSCOPIC - Abnormal; Notable for the following components:      Result Value   Color, Urine STRAW (*)    Specific Gravity, Urine 1.002 (*)    All other components within normal limits  CBC WITH DIFFERENTIAL/PLATELET - Abnormal;  Notable for the following components:   Hemoglobin 11.1 (*)    HCT 35.6 (*)    All other components within normal limits  BASIC METABOLIC PANEL  PREGNANCY, URINE    EKG None  Radiology No results found.  Procedures Procedures (including critical care time)  Medications Ordered in ED Medications  sodium chloride 0.9 % bolus 500 mL (has no administration in time range)  ondansetron (ZOFRAN) injection 4 mg (has no administration in time range)     Initial Impression / Assessment and Plan / ED Course  I have reviewed the triage vital  signs and the nursing notes.  Pertinent labs & imaging results that were available during my care of the patient were reviewed by me and considered in my medical decision making (see chart for details).     Patient with lower abdominal pain.  Labs reassuring.  Feels better.  Asking for orals.  Will discharge.  Final Clinical Impressions(s) / ED Diagnoses   Final diagnoses:  Lower abdominal pain    ED Discharge Orders         Ordered    ondansetron (ZOFRAN-ODT) 4 MG disintegrating tablet  Every 8 hours PRN     09/12/18 1453           Benjiman Core, MD 09/12/18 1500

## 2018-09-12 NOTE — ED Triage Notes (Signed)
Pt states she started having lower abdominal pain approximately 30 minutes ago. Pt found over garbage can spitting upon entrance to room.

## 2018-09-16 ENCOUNTER — Emergency Department (HOSPITAL_COMMUNITY)
Admission: EM | Admit: 2018-09-16 | Discharge: 2018-09-17 | Disposition: A | Discharge status: Home / Self Care | Payer: Medicare Other | Attending: Emergency Medicine | Admitting: Emergency Medicine

## 2018-09-16 ENCOUNTER — Encounter (HOSPITAL_COMMUNITY): Payer: Self-pay | Admitting: Emergency Medicine

## 2018-09-16 DIAGNOSIS — G4489 Other headache syndrome: Secondary | ICD-10-CM | POA: Diagnosis not present

## 2018-09-16 DIAGNOSIS — E876 Hypokalemia: Secondary | ICD-10-CM | POA: Diagnosis not present

## 2018-09-16 DIAGNOSIS — R52 Pain, unspecified: Secondary | ICD-10-CM | POA: Diagnosis not present

## 2018-09-16 DIAGNOSIS — F22 Delusional disorders: Secondary | ICD-10-CM | POA: Diagnosis not present

## 2018-09-16 DIAGNOSIS — R443 Hallucinations, unspecified: Secondary | ICD-10-CM | POA: Insufficient documentation

## 2018-09-16 DIAGNOSIS — F600 Paranoid personality disorder: Secondary | ICD-10-CM | POA: Diagnosis not present

## 2018-09-16 DIAGNOSIS — F259 Schizoaffective disorder, unspecified: Secondary | ICD-10-CM | POA: Insufficient documentation

## 2018-09-16 DIAGNOSIS — F250 Schizoaffective disorder, bipolar type: Secondary | ICD-10-CM | POA: Diagnosis present

## 2018-09-16 LAB — CBC WITH DIFFERENTIAL/PLATELET
ABS IMMATURE GRANULOCYTES: 0.01 10*3/uL (ref 0.00–0.07)
BASOS ABS: 0 10*3/uL (ref 0.0–0.1)
Basophils Relative: 1 %
EOS ABS: 0.1 10*3/uL (ref 0.0–0.5)
Eosinophils Relative: 3 %
HEMATOCRIT: 37.2 % (ref 36.0–46.0)
Hemoglobin: 11.7 g/dL — ABNORMAL LOW (ref 12.0–15.0)
IMMATURE GRANULOCYTES: 0 %
LYMPHS ABS: 1.5 10*3/uL (ref 0.7–4.0)
LYMPHS PCT: 38 %
MCH: 27.8 pg (ref 26.0–34.0)
MCHC: 31.5 g/dL (ref 30.0–36.0)
MCV: 88.4 fL (ref 80.0–100.0)
Monocytes Absolute: 0.3 10*3/uL (ref 0.1–1.0)
Monocytes Relative: 8 %
NEUTROS ABS: 2 10*3/uL (ref 1.7–7.7)
NEUTROS PCT: 50 %
NRBC: 0 % (ref 0.0–0.2)
Platelets: 420 10*3/uL — ABNORMAL HIGH (ref 150–400)
RBC: 4.21 MIL/uL (ref 3.87–5.11)
RDW: 13.8 % (ref 11.5–15.5)
WBC: 4 10*3/uL (ref 4.0–10.5)

## 2018-09-16 LAB — COMPREHENSIVE METABOLIC PANEL
ALT: 13 U/L (ref 0–44)
ANION GAP: 8 (ref 5–15)
AST: 22 U/L (ref 15–41)
Albumin: 4.2 g/dL (ref 3.5–5.0)
Alkaline Phosphatase: 47 U/L (ref 38–126)
BUN: 10 mg/dL (ref 6–20)
CHLORIDE: 103 mmol/L (ref 98–111)
CO2: 27 mmol/L (ref 22–32)
Calcium: 9 mg/dL (ref 8.9–10.3)
Creatinine, Ser: 0.75 mg/dL (ref 0.44–1.00)
GFR calc Af Amer: >60 mL/min (ref >60)
GFR calc non Af Amer: >60 mL/min (ref >60)
Glucose, Bld: 92 mg/dL (ref 70–99)
POTASSIUM: 3.3 mmol/L — AB (ref 3.5–5.1)
SODIUM: 138 mmol/L (ref 135–145)
TOTAL PROTEIN: 7 g/dL (ref 6.5–8.1)
Total Bilirubin: 0.7 mg/dL (ref 0.3–1.2)

## 2018-09-16 LAB — I-STAT BETA HCG BLOOD, ED (MC, WL, AP ONLY): I-stat hCG, quantitative: <5 m[IU]/mL (ref <5)

## 2018-09-16 LAB — ETHANOL: Alcohol, Ethyl (B): <10 mg/dL (ref <10)

## 2018-09-16 LAB — SALICYLATE LEVEL

## 2018-09-16 LAB — ACETAMINOPHEN LEVEL

## 2018-09-16 MED ORDER — POTASSIUM CHLORIDE CRYS ER 20 MEQ PO TBCR
40.0000 meq | EXTENDED_RELEASE_TABLET | Freq: Once | ORAL | Status: AC
  Administered 2018-09-16: 40 meq via ORAL
  Filled 2018-09-16: qty 2

## 2018-09-16 MED ORDER — OLANZAPINE 10 MG PO TBDP
10.0000 mg | ORAL_TABLET | Freq: Once | ORAL | Status: AC
  Administered 2018-09-16: 10 mg via ORAL
  Filled 2018-09-16: qty 1

## 2018-09-16 NOTE — ED Provider Notes (Signed)
[facility]   CSN: [id number] Arrival date & time: 09/16/18  1217     History   Chief Complaint Chief Complaint  Patient presents with  . Medical Clearance    HPI [name] [name] is a 42 y.o. female.  [name] [name] is a 42 y.o. Female with a history of depression and schizoaffective disorder, who presents to the emergency department via [place].  Patient believes that someone is out to get her, she reports that she is had issues with paranoid thoughts for years but today she is convinced that they will actually get her and that someone is trying to hurt her.  Patient noted to be responding to internal stimuli with EMS and nursing staff.  She denies SI or HI.  Known history of the same with multiple psychiatric admissions in the past.  She denies any focal medical complaints, no fevers or recent illnesses, denies chest pain or shortness of breath, no abdominal pain, nausea or vomiting, no headaches or vision changes.  Reports she has some mild pain in her right arm where her bag was resting on it but she thinks this is just sore and is no longer hurting at this point.     Past Medical History:  Diagnosis Date  . Arthritis   . Depression     Patient Active Problem List   Diagnosis Date Noted  . Schizoaffective disorder, bipolar type (HCC) 09/01/2018    History reviewed. No pertinent surgical history.   OB History   None      Home Medications    Prior to Admission medications   Medication Sig Start Date End Date Taking? Authorizing Provider  hydrOXYzine (ATARAX/VISTARIL) 50 MG tablet Take 1 tablet (50 mg total) by mouth every 6 (six) hours as needed for anxiety. 09/06/18  Yes [place], Burlene Arnt, MD  OLANZapine (ZYPREXA) 10 MG tablet Take 1 tablet (10 mg total) by mouth at bedtime. 09/06/18  Yes [place], Burlene Arnt, MD  ondansetron (ZOFRAN-ODT) 4 MG  disintegrating tablet Take 1 tablet (4 mg total) by mouth every 8 (eight) hours as needed for nausea or vomiting. 09/12/18  Yes Benjiman Core, MD  traZODone (DESYREL) 50 MG tablet Take 1 tablet (50 mg total) by mouth at bedtime as needed for sleep. 09/06/18  Yes Micheal Likens, MD    Family History History reviewed. No pertinent family history.  Social History Social History   Tobacco Use  . Smoking status: Never Smoker  . Smokeless tobacco: Never Used  Substance Use Topics  . Alcohol use: Not Currently  . Drug use: Not Currently     Allergies   Patient has no known allergies.   Review of Systems Review of Systems  Constitutional: Negative for chills and fever.  HENT: Negative.   Eyes: Negative for visual disturbance.  Respiratory: Negative for cough and shortness of breath.   Cardiovascular: Negative for chest pain.  Gastrointestinal: Negative for abdominal pain, nausea and vomiting.  Genitourinary: Negative for dysuria.  Musculoskeletal: Negative for arthralgias and myalgias.  Skin: Negative for color change and rash.  Neurological: Negative for dizziness, weakness, light-headedness, numbness and headaches.  Psychiatric/Behavioral: Positive for hallucinations. Negative for agitation, behavioral problems and suicidal ideas. The patient is nervous/anxious.      Physical Exam Updated Vital Signs BP 112/73 (BP Location: Right Arm)   Pulse 89   Temp 97.8 F (36.6 C) (Oral)   Resp 18  LMP  (LMP Unknown)   SpO2 100%   Physical Exam  Constitutional: She appears well-developed and well-nourished. No distress.  Patient hears very anxious but is in no acute distress  HENT:  Head: Normocephalic and atraumatic.  Mouth/Throat: Oropharynx is clear and moist.  Eyes: Pupils are equal, round, and reactive to light. EOM are normal. Right eye exhibits no discharge. Left eye exhibits no discharge.  Neck: Neck supple.  Cardiovascular: Normal rate, regular rhythm,  normal heart sounds and intact distal pulses.  Pulmonary/Chest: Effort normal and breath sounds normal. No respiratory distress. She has no wheezes. She has no rales.  Respirations equal and unlabored, patient able to speak in full sentences, lungs clear to auscultation bilaterally  Abdominal: Soft. Bowel sounds are normal. She exhibits no distension and no mass. There is no tenderness. There is no guarding.  Abdomen soft, nondistended, nontender to palpation in all quadrants without guarding or peritoneal signs  Musculoskeletal: She exhibits no edema or deformity.  Neurological: She is alert. Coordination normal.  Speech is clear, able to follow commands CN III-XII intact Normal strength in upper and lower extremities bilaterally including dorsiflexion and plantar flexion, strong and equal grip strength Sensation normal to light and sharp touch Moves extremities without ataxia, coordination intact  Skin: Skin is warm and dry. Capillary refill takes less than 2 seconds. She is not diaphoretic.  Psychiatric: Her speech is normal. Her mood appears anxious. She is withdrawn and actively hallucinating. Thought content is paranoid. She expresses no homicidal and no suicidal ideation. She expresses no suicidal plans and no homicidal plans.  Nursing note and vitals reviewed.    ED Treatments / Results  Labs (all labs ordered are listed, but only abnormal results are displayed) Labs Reviewed  COMPREHENSIVE METABOLIC PANEL - Abnormal; Notable for the following components:      Result Value   Potassium 3.3 (*)    All other components within normal limits  CBC WITH DIFFERENTIAL/PLATELET - Abnormal; Notable for the following components:   Hemoglobin 11.7 (*)    Platelets 420 (*)    All other components within normal limits  ACETAMINOPHEN LEVEL - Abnormal; Notable for the following components:   Acetaminophen (Tylenol), Serum <10 (*)    All other components within normal limits  ETHANOL    SALICYLATE LEVEL  RAPID URINE DRUG SCREEN, HOSP PERFORMED  I-STAT BETA HCG BLOOD, ED ([facility], [facility], [facility] ONLY)    EKG EKG Interpretation  Date/Time:  Monday September 16 2018 14:35:48 EST Ventricular Rate:  76 PR Interval:  160 QRS Duration: 86 QT Interval:  396 QTC Calculation: 445 R Axis:   82 Text Interpretation:  Normal sinus rhythm Normal ECG similar to prior 8/19 Confirmed by Meridee Score [phone]) on 09/16/2018 2:45:26 PM   Radiology No results found.  Procedures Procedures (including critical care time)  Medications Ordered in ED Medications  potassium chloride SA (K-DUR,KLOR-CON) CR tablet 40 mEq (40 mEq Oral Given 09/16/18 1455)     Initial Impression / Assessment and Plan / ED Course  I have reviewed the triage vital signs and the nursing notes.  Pertinent labs & imaging results that were available during my care of the patient were reviewed by me and considered in my medical decision making (see chart for details).  Patient presents with paranoid thinking, and hallucinations, brought in by EMS.  History of the same with multiple psychiatric admissions in the past.  Patient with normal vitals and appears to be in no acute distress, nursing staff  has witnessed her responding to internal stimuli when no one else is in the room.  She denies any focal medical complaints.  Vitals are stable and she has no focal findings on exam will get medical clearance labs and EKG.  EKG without concerning ischemic changes.  No acute electrolyte derangements aside from mild hypokalemia of 3.3 which was replaced with oral potassium, normal renal and liver function, no leukocytosis, stable hemoglobin, negative acetaminophen, salicylate and ethanol levels.  UDS pending.  Negative pregnancy.  At this time patient is medically cleared for psychiatric evaluation, she has been placed under ED psych hold.  TTS has seen and evaluated the patient and recommends inpatient psychiatric admission, awaiting  bed.  Final Clinical Impressions(s) / ED Diagnoses   Final diagnoses:  Paranoid behavior (HCC)  Hallucinations  Hypokalemia    ED Discharge Orders    None       Dartha Lodge, [place] 09/16/18 1549    Terrilee Files, MD 09/17/18 [phone]

## 2018-09-16 NOTE — ED Notes (Signed)
Pt resting at present, no distress noted, calm & cooperative at present.  Monitoring for safety, Q 15 min checks in effect. 

## 2018-09-16 NOTE — ED Notes (Signed)
Specimen cup provided. Pt encouraged to provide urine per MD order  

## 2018-09-16 NOTE — ED Triage Notes (Signed)
Pt arrived by EMS.  [place].  Pt believes someone is out to get her.  Pt appears manic and may be responding to internal stimuli.

## 2018-09-16 NOTE — ED Notes (Signed)
Pt in room yelling, actively hallucinating. This nurse notified Jorene Minors NP of pt behavior, awaiting orders.

## 2018-09-16 NOTE — ED Notes (Signed)
Bed: WTR9 Expected date:  Expected time:  Means of arrival:  Comments: EMS-arm pain

## 2018-09-16 NOTE — ED Notes (Signed)
Pt to room #34, pleasant on approach, reports increase in paranoia. Pt reports she feels that someone is after her. Pt denies SI/HI/AVH. Encouragement and support provided. Special checks q 15 mins in place for safety, Video monitoring in place. Will continue to monitor.

## 2018-09-16 NOTE — ED Notes (Signed)
EKG given to EDP for review, no new orders  

## 2018-09-16 NOTE — ED Notes (Signed)
Pt in room screaming, referencing [name]. paranoia noted. Encouragement and support provided. Special checks q 15 mins in place for safety, Video monitoring in place. Will continue to monitor.

## 2018-09-16 NOTE — BH Assessment (Addendum)
Assessment Note  [name] [name] is an 42 y.o. female that presents this date with paranoia and is delusional. Patient has a history of Schizophrenia per note review and has been receiving services from [facility]. Patient reports she is currently "being followed by a religious man" although will not elaborate on details. Patient is speaking in a low soft voice and is difficult to understand. Patient will not uncover her head with the blanket and cannot be redirected when prompted. Patient is speaking incoherently at times while her head is covered and keeps repeating "Jesus will understand." Patient denies any S/I, H/I or AVH. Limited history can be obtained on this patient due to current mental state. Information to complete assessment was obtained from admission notes and history. Per notes patient is well known to area providers with last noted admission on 08/31/18 when patient presented with similar symptoms. Per notes patient continues to reside at [facility] when she began to feel paranoid that someone was after her. Patient has a history of presenting to the ED with the same delusions that [facility] is trying to kill her. Patient moved from [place] to [place] 2 months ago and has been living in various shelters. Patient has been receiving services from [facility] Team that assists with medication management although it is unclear if patient continues with that service. Patient per notes, states she has not been on any medications for over one month. Patient states she has not taken her medication that she is prescribed because she does not feel that she needs them. Patient denies any current symptoms associated with a mental health disorder. Per record review patient has no history of substance abuse. Patient denies any SI/HI/AVH on admission. Case was staffed with Shaune Pollack DNP who recommended a inpatient admission.    Diagnosis: F20.9 Schizophrenia   Past Medical History:  Past Medical History:   Diagnosis Date  . Arthritis   . Depression     History reviewed. No pertinent surgical history.  Family History: History reviewed. No pertinent family history.  Social History:  reports that she has never smoked. She has never used smokeless tobacco. She reports that she drank alcohol. She reports that she has current or past drug history.  Additional Social History:  Alcohol / Drug Use Pain Medications: see MAR Prescriptions: see MAR Over the Counter: see MAR History of alcohol / drug use?: No history of alcohol / drug abuse Longest period of sobriety (when/how long): patient denies Negative Consequences of Use: (Denies) Withdrawal Symptoms: (Denies)  CIWA: CIWA-Ar BP: 112/73 Pulse Rate: 89 COWS:    Allergies: No Known Allergies  Home Medications:  (Not in a hospital admission)  OB/GYN Status:  No LMP recorded (lmp unknown).  General Assessment Data [facility] of Assessment: [facility] TTS Assessment: In system Is this a Tele or Face-to-Face Assessment?: Face-to-Face Is this an Initial Assessment or a Re-assessment for this encounter?: Initial Assessment Patient Accompanied by:: N/A Language Other than English: No Living Arrangements: Homeless/Shelter What gender do you identify as?: Female Marital status: Single Maiden name: Roll Pregnancy Status: No Living Arrangements: Other Forensic psychologist) Can pt return to current living arrangement?: Yes Admission Status: Voluntary Is patient capable of signing voluntary admission?: Yes Referral Source: Self/Family/Friend Insurance type: MCR  Medical Screening Exam [facility] Walk-in ONLY) Medical Exam completed: Yes  Crisis Care Plan Living Arrangements: Other (Comment)([facility]) Legal Guardian: (NA) Name of Psychiatrist: [facility] Name of Therapist: [facility]  Education Status Is patient currently in school?: No Current Grade:  NA Highest grade of school patient has completed: NA Name of school: NA Contact  person: NA IEP information if applicable: NA Is the patient employed, unemployed or receiving disability?: Receiving disability income  Risk to self with the past 6 months Suicidal Ideation: No Has patient been a risk to self within the past 6 months prior to admission? : No Suicidal Intent: No Has patient had any suicidal intent within the past 6 months prior to admission? : No Is patient at risk for suicide?: No Suicidal Plan?: No Has patient had any suicidal plan within the past 6 months prior to admission? : No Access to Means: No What has been your use of drugs/alcohol within the last 12 months?: Denies Previous Attempts/Gestures: No How many times?: 2(Per notes) Other Self Harm Risks: (Homeless) Triggers for Past Attempts: Unknown Intentional Self Injurious Behavior: Damaging Comment - Self Injurious Behavior: Hitting per hx Family Suicide History: No Recent stressful life event(s): Other (Comment)(Homeless) Persecutory voices/beliefs?: No Depression: No Depression Symptoms: (Pt denies) Substance abuse history and/or treatment for substance abuse?: No Suicide prevention information given to non-admitted patients: Not applicable  Risk to Others within the past 6 months Homicidal Ideation: No Does patient have any lifetime risk of violence toward others beyond the six months prior to admission? : No Thoughts of Harm to Others: No Current Homicidal Intent: No Current Homicidal Plan: No Access to Homicidal Means: No Identified Victim: NA History of harm to others?: No Assessment of Violence: None Noted Violent Behavior Description: NA Does patient have access to weapons?: No Criminal Charges Pending?: Yes Describe Pending Criminal Charges: Misuse of 911 Does patient have a court date: Yes Court Date: 09/27/18 Is patient on probation?: No  Psychosis Hallucinations: None noted Delusions: Unspecified  Mental Status Report Appearance/Hygiene: In scrubs Eye Contact:  Unable to Assess Motor Activity: Freedom of movement Speech: Incoherent, Slow Level of Consciousness: Drowsy Mood: Suspicious Affect: Labile Anxiety Level: Moderate Thought Processes: Unable to Assess Judgement: Unable to Assess Orientation: Unable to assess Obsessive Compulsive Thoughts/Behaviors: None  Cognitive Functioning Concentration: Unable to Assess Memory: Unable to Assess Is patient IDD: No Insight: Unable to Assess Impulse Control: Unable to Assess Appetite: (UTA) Have you had any weight changes? : (UTA) Sleep: (UTA) Total Hours of Sleep: (UTA) Vegetative Symptoms: None  ADLScreening [facility] Assessment Services) Patient's cognitive ability adequate to safely complete daily activities?: Yes Patient able to express need for assistance with ADLs?: Yes Independently performs ADLs?: Yes (appropriate for developmental age)  Prior Inpatient Therapy Prior Inpatient Therapy: Yes Prior Therapy Dates: 2019 Prior Therapy Facilty/Provider(s): [facility], [facility] Reason for Treatment: MH issues  Prior Outpatient Therapy Prior Outpatient Therapy: Yes Prior Therapy Dates: Ongoing Prior Therapy Facilty/Provider(s): [facility] Reason for Treatment: Med mang Does patient have an ACCT team?: Yes Does patient have Intensive In-House Services?  : No Does patient have [facility] services? : Yes Does patient have P4CC services?: No  ADL Screening (condition at time of admission) Patient's cognitive ability adequate to safely complete daily activities?: Yes Is the patient deaf or have difficulty hearing?: No Does the patient have difficulty seeing, even when wearing glasses/contacts?: No Does the patient have difficulty concentrating, remembering, or making decisions?: Yes Patient able to express need for assistance with ADLs?: Yes Does the patient have difficulty dressing or bathing?: No Independently performs ADLs?: Yes (appropriate for developmental age) Does the patient have difficulty  walking or climbing stairs?: No Weakness of Legs: None Weakness of Arms/Hands: None  Home Assistive Devices/Equipment Home Assistive Devices/Equipment:  None  Therapy Consults (therapy consults require a physician order) PT Evaluation Needed: No OT Evalulation Needed: No SLP Evaluation Needed: No Abuse/Neglect Assessment (Assessment to be complete while patient is alone) Physical Abuse: Denies Verbal Abuse: Yes, past (Comment)(Per previous incident) Sexual Abuse: Denies Exploitation of patient/patient's resources: Denies Self-Neglect: Denies Values / Beliefs Cultural Requests During Hospitalization: None Spiritual Requests During Hospitalization: None Consults Spiritual Care Consult Needed: No Social Work Consult Needed: No Merchant navy officer (For Healthcare) Does Patient Have a Medical Advance Directive?: No Does patient want to make changes to medical advance directive?: No - Patient declined Would patient like information on creating a medical advance directive?: No - Patient declined          Disposition: Case was staffed with Shaune Pollack DNP who recommended a inpatient admission.    Disposition Initial Assessment Completed for this Encounter: Yes Disposition of Patient: Admit Type of inpatient treatment program: Adult Patient refused recommended treatment: No Mode of transportation if patient is discharged?: (Unk)  On Site Evaluation by:   Reviewed with Physician:    Alfredia Ferguson 09/16/2018 2:49 PM

## 2018-09-16 NOTE — BH Assessment (Signed)
[facility] Assessment Progress Note   Case was staffed with Lord DNP who recommended a inpatient admission.   

## 2018-09-17 ENCOUNTER — Inpatient Hospital Stay (HOSPITAL_COMMUNITY): Admission: AD | Admit: 2018-09-17 | Payer: Medicare Other | Admitting: Psychiatry

## 2018-09-17 DIAGNOSIS — F259 Schizoaffective disorder, unspecified: Secondary | ICD-10-CM | POA: Diagnosis not present

## 2018-09-17 LAB — RAPID URINE DRUG SCREEN, HOSP PERFORMED
Amphetamines: NOT DETECTED
BENZODIAZEPINES: NOT DETECTED
Barbiturates: NOT DETECTED
COCAINE: NOT DETECTED
Opiates: NOT DETECTED
Tetrahydrocannabinol: NOT DETECTED

## 2018-09-17 MED ORDER — OLANZAPINE 10 MG PO TBDP
10.0000 mg | ORAL_TABLET | Freq: Once | ORAL | Status: AC
  Administered 2018-09-17: 10 mg via ORAL
  Filled 2018-09-17: qty 1

## 2018-09-17 NOTE — ED Notes (Signed)
Patient took po zyprexa without difficulty.

## 2018-09-17 NOTE — ED Notes (Signed)
Patient hitting herself in the face.  Yelling out occasionally.  Dr. Erma HeritageIsaacs notified and medication ordered.

## 2018-09-17 NOTE — Consult Note (Signed)
[facility] ED Discharge  09/17/2018 11:27 AM [name] [name]  MRN:  [id number] Principal Problem: Schizoaffective disorder, bipolar type [facility]) Discharge Diagnoses:  Patient Active Problem List   Diagnosis Date Noted  . Schizoaffective disorder, bipolar type (HCC) [F25.0] 09/01/2018    Subjective:  [name] [name] reports that she is in the hospital because she needs a place to stay. SHe reports she has been stable and doing well at [facility] where she goes for shelter.  She denies SI, HI or AVH. She does not appear to be responding to internal stimuli or preoccupied, although she does have some delay thought processes. She has been noted to demonstrate volitional behaviors for attention seeking. She is able to contract for safety and declines inpatient admission at this time. At the time of this evalution she is alert and oriented, calm and cooperative and appears compent to make this decision without the assistance of medical professionals.   Total Time spent with patient: 30 minutes  Past Psychiatric History: schizoaffective disorder   Past Medical History:  Past Medical History:  Diagnosis Date  . Arthritis   . Depression    History reviewed. No pertinent surgical history. Family History: History reviewed. No pertinent family history. Family Psychiatric  History:  Social History:  Social History   Substance and Sexual Activity  Alcohol Use Not Currently     Social History   Substance and Sexual Activity  Drug Use Not Currently    Social History   Socioeconomic History  . Marital status: Single    Spouse name: Not on file  . Number of children: Not on file  . Years of education: Not on file  . Highest education level: Not on file  Occupational History  . Not on file  Social Needs  . Financial resource strain: Not on file  . Food insecurity:    Worry: Not on file    Inability: Not on file  . Transportation needs:    Medical: Not on file    Non-medical: Not on file   Tobacco Use  . Smoking status: Never Smoker  . Smokeless tobacco: Never Used  Substance and Sexual Activity  . Alcohol use: Not Currently  . Drug use: Not Currently  . Sexual activity: Not Currently  Lifestyle  . Physical activity:    Days per week: Not on file    Minutes per session: Not on file  . Stress: Not on file  Relationships  . Social connections:    Talks on phone: Not on file    Gets together: Not on file    Attends religious service: Not on file    Active member of club or organization: Not on file    Attends meetings of clubs or organizations: Not on file    Relationship status: Not on file  Other Topics Concern  . Not on file  Social History Narrative  . Not on file    Has this patient used any form of tobacco in the last 30 days? (Cigarettes, Smokeless Tobacco, Cigars, and/or Pipes) Prescription not provided because: Patient is not a tobacco user.  Current Medications: No current facility-administered medications for this encounter.    Current Outpatient Medications  Medication Sig Dispense Refill  . hydrOXYzine (ATARAX/VISTARIL) 50 MG tablet Take 1 tablet (50 mg total) by mouth every 6 (six) hours as needed for anxiety. 30 tablet 0  . OLANZapine (ZYPREXA) 10 MG tablet Take 1 tablet (10 mg total) by mouth at bedtime. 30 tablet 0  .  ondansetron (ZOFRAN-ODT) 4 MG disintegrating tablet Take 1 tablet (4 mg total) by mouth every 8 (eight) hours as needed for nausea or vomiting. 8 tablet 0  . traZODone (DESYREL) 50 MG tablet Take 1 tablet (50 mg total) by mouth at bedtime as needed for sleep. 30 tablet 0   PTA Medications:  (Not in a hospital admission)  Musculoskeletal: Strength & Muscle Tone: within normal limits Gait & Station: UTA since patient is lying in bed. Patient leans: N/A  Psychiatric Specialty Exam: Physical Exam  Nursing note and vitals reviewed. Constitutional: She is oriented to person, place, and time. She appears well-developed and  well-nourished.  HENT:  Head: Normocephalic and atraumatic.  Neck: Normal range of motion.  Respiratory: Effort normal.  Musculoskeletal: Normal range of motion.  Neurological: She is alert and oriented to person, place, and time.  Psychiatric: She has a normal mood and affect. Her speech is normal and behavior is normal. Judgment normal. Cognition and memory are normal.    Review of Systems  Psychiatric/Behavioral: Negative for hallucinations, substance abuse and suicidal ideas.  All other systems reviewed and are negative.   Blood pressure 119/85, pulse 95, temperature 97.8 F (36.6 C), temperature source Oral, resp. rate 18, SpO2 98 %.There is no height or weight on file to calculate BMI.  General Appearance: Fairly Groomed, middle aged, African American female, wearing paper hospital scrubs and lying in bed. NAD. Nails are done and wearing head wrap.   Eye Contact:  Good  Speech:  Slow  Volume:  Normal  Mood:  Euthymic  Affect:  Appropriate and Congruent  Thought Process:  Goal Directed, Linear and Descriptions of Associations: Intact  Orientation:  Full (Time, Place, and Person)  Thought Content:  Logical  Suicidal Thoughts:  No  Homicidal Thoughts:  No  Memory:  Immediate;   Good Recent;   Good Remote;   Good  Judgement:  Fair  Insight:  Fair  Psychomotor Activity:  Normal  Concentration:  Concentration: Good and Attention Span: Good  Recall:  Good  Fund of Knowledge:  Good  Language:  Good  Akathisia:  No  Handed:  Right  AIMS (if indicated):   N/A  Assets:  Communication Skills Desire for Improvement  ADL's:  Intact  Cognition:  WNL  Sleep:   N/A     Demographic Factors:  Low socioeconomic status and Unemployed  Loss Factors: NA  Historical Factors: Impulsivity  Risk Reduction Factors:   Living with another person, especially a relative  Continued Clinical Symptoms:  Previous Psychiatric Diagnoses and Treatments  Cognitive Features That Contribute  To Risk:  None    Suicide Risk:  Minimal: No identifiable suicidal ideation.  Patients presenting with no risk factors but with morbid ruminations; may be classified as minimal risk based on the severity of the depressive symptoms   Assessment:  [name] [name] is a 42 y.o. female who was admitted with paranoia and was delusional. She received behavioral medications overnight. She was restarted on her home medication. She has been appropriate in behavior and thought process on the unit. She does not appear to be responding to internal stimuli. She appears to be malingering for food and shelter. She does not warrant inpatient psychiatric hospitalization at this time.   Plan Of Care/Follow-up recommendations:  -Restarted Zyprexa 10 mg daily for bipolar disorder.  -Patient should follow up with outpatient provider for medication management.   Disposition: Discharge home Maryagnes Amos, FNP 09/17/2018, 11:27 AM

## 2018-09-17 NOTE — BH Assessment (Signed)
[facility] Assessment Progress Note  Per Nelly Rout, MD, this voluntary pt would benefit from psychiatric hospitalization, but does not meet criteria for IVC.  This Clinical research associate spoke to pt to discuss disposition.  She reports that she does not want to be hospitalized.  She reports that she is a client of the [facility].  Pt is to be discharged from [facility] with recommendation to continue treatment with them.  This has been included in pt's discharge instructions.  Pt's nurse, Aram Beecham, has been notified.  Doylene Canning, [place] Behavioral Health Coordinator [phone]

## 2018-09-17 NOTE — ED Notes (Signed)
Patient lying in bed making intermittent shrill noises.

## 2018-09-17 NOTE — ED Notes (Signed)
Patient given urine cup earlier in the shift and reminded several times to give a urine sample, but patient has refused.

## 2018-09-17 NOTE — Discharge Instructions (Signed)
For your behavioral health needs, you are advised to continue treatment with the [facility]: ° °     [facility] °     [facility], [address] °     [address] °     [place], [place]  [address] °     [phone] °     Crisis number: [phone] °

## 2018-09-17 NOTE — ED Notes (Signed)
Patient now in bathroom hopefully giving urine sample.

## 2018-09-17 NOTE — ED Notes (Signed)
Patient asked to take a shower.  She showered and is walking around responding to internal stimuli with a distressed look on her face.  After zyprexa, patient no longer hitting herself in the face.

## 2018-09-17 NOTE — ED Notes (Signed)
Patient talking to her [facility] worker Devonne DoughtyShanna on the phone.  Shanna to pick her up in half an hour.

## 2018-10-01 ENCOUNTER — Emergency Department (HOSPITAL_COMMUNITY)
Admission: EM | Admit: 2018-10-01 | Discharge: 2018-10-03 | Disposition: A | Discharge status: Home / Self Care | Payer: Medicare Other | Attending: Emergency Medicine | Admitting: Emergency Medicine

## 2018-10-01 ENCOUNTER — Encounter (HOSPITAL_COMMUNITY): Payer: Self-pay

## 2018-10-01 DIAGNOSIS — R110 Nausea: Secondary | ICD-10-CM | POA: Diagnosis not present

## 2018-10-01 DIAGNOSIS — R443 Hallucinations, unspecified: Secondary | ICD-10-CM | POA: Insufficient documentation

## 2018-10-01 DIAGNOSIS — R197 Diarrhea, unspecified: Secondary | ICD-10-CM | POA: Diagnosis not present

## 2018-10-01 DIAGNOSIS — F250 Schizoaffective disorder, bipolar type: Secondary | ICD-10-CM | POA: Insufficient documentation

## 2018-10-01 DIAGNOSIS — R451 Restlessness and agitation: Secondary | ICD-10-CM | POA: Diagnosis not present

## 2018-10-01 DIAGNOSIS — R1111 Vomiting without nausea: Secondary | ICD-10-CM | POA: Diagnosis not present

## 2018-10-01 DIAGNOSIS — R404 Transient alteration of awareness: Secondary | ICD-10-CM | POA: Diagnosis not present

## 2018-10-01 DIAGNOSIS — Z79899 Other long term (current) drug therapy: Secondary | ICD-10-CM | POA: Insufficient documentation

## 2018-10-01 DIAGNOSIS — I499 Cardiac arrhythmia, unspecified: Secondary | ICD-10-CM | POA: Diagnosis not present

## 2018-10-01 LAB — CBC WITH DIFFERENTIAL/PLATELET
ABS IMMATURE GRANULOCYTES: 0.01 10*3/uL (ref 0.00–0.07)
BASOS ABS: 0 10*3/uL (ref 0.0–0.1)
Basophils Relative: 1 %
EOS ABS: 0.2 10*3/uL (ref 0.0–0.5)
Eosinophils Relative: 6 %
HCT: 34.8 % — ABNORMAL LOW (ref 36.0–46.0)
HEMOGLOBIN: 11.3 g/dL — AB (ref 12.0–15.0)
IMMATURE GRANULOCYTES: 0 %
LYMPHS ABS: 1.7 10*3/uL (ref 0.7–4.0)
LYMPHS PCT: 40 %
MCH: 28.8 pg (ref 26.0–34.0)
MCHC: 32.5 g/dL (ref 30.0–36.0)
MCV: 88.8 fL (ref 80.0–100.0)
MONOS PCT: 11 %
Monocytes Absolute: 0.5 10*3/uL (ref 0.1–1.0)
NEUTROS PCT: 42 %
NRBC: 0 % (ref 0.0–0.2)
Neutro Abs: 1.7 10*3/uL (ref 1.7–7.7)
Platelets: 287 10*3/uL (ref 150–400)
RBC: 3.92 MIL/uL (ref 3.87–5.11)
RDW: 13.7 % (ref 11.5–15.5)
WBC: 4.1 10*3/uL (ref 4.0–10.5)

## 2018-10-01 LAB — COMPREHENSIVE METABOLIC PANEL
ALBUMIN: 4.1 g/dL (ref 3.5–5.0)
ALK PHOS: 42 U/L (ref 38–126)
ALT: 13 U/L (ref 0–44)
AST: 19 U/L (ref 15–41)
Anion gap: 6 (ref 5–15)
BUN: 11 mg/dL (ref 6–20)
CO2: 29 mmol/L (ref 22–32)
Calcium: 9.4 mg/dL (ref 8.9–10.3)
Chloride: 106 mmol/L (ref 98–111)
Creatinine, Ser: 0.68 mg/dL (ref 0.44–1.00)
GFR calc Af Amer: >60 mL/min (ref >60)
GFR calc non Af Amer: >60 mL/min (ref >60)
GLUCOSE: 91 mg/dL (ref 70–99)
POTASSIUM: 3.4 mmol/L — AB (ref 3.5–5.1)
SODIUM: 141 mmol/L (ref 135–145)
Total Bilirubin: 0.8 mg/dL (ref 0.3–1.2)
Total Protein: 6.6 g/dL (ref 6.5–8.1)

## 2018-10-01 LAB — I-STAT BETA HCG BLOOD, ED (MC, WL, AP ONLY): I-stat hCG, quantitative: <5 m[IU]/mL (ref <5)

## 2018-10-01 LAB — ETHANOL: Alcohol, Ethyl (B): <10 mg/dL (ref <10)

## 2018-10-01 LAB — SALICYLATE LEVEL

## 2018-10-01 LAB — ACETAMINOPHEN LEVEL: Acetaminophen (Tylenol), Serum: <10 ug/mL — ABNORMAL LOW (ref 10–30)

## 2018-10-01 MED ORDER — ZIPRASIDONE MESYLATE 20 MG IM SOLR
20.0000 mg | Freq: Once | INTRAMUSCULAR | Status: AC
  Administered 2018-10-01: 20 mg via INTRAMUSCULAR
  Filled 2018-10-01: qty 20

## 2018-10-01 MED ORDER — LORAZEPAM 2 MG/ML IJ SOLN: 2.0000 mg | Freq: Once | INTRAMUSCULAR | Status: DC

## 2018-10-01 MED ORDER — STERILE WATER FOR INJECTION IJ SOLN
INTRAMUSCULAR | Status: AC
  Administered 2018-10-01: 15:00:00
  Filled 2018-10-01: qty 10

## 2018-10-01 NOTE — ED Notes (Signed)
Pt sleeping in bed. Unable to asses at this time.

## 2018-10-01 NOTE — ED Notes (Signed)
Pt refusing to allow writer to obtain blood. Provider made aware.

## 2018-10-01 NOTE — ED Notes (Signed)
Patient now saying nurse can get lab specimens.  These were drawn and sent to lab.

## 2018-10-01 NOTE — ED Triage Notes (Addendum)
Pt is alert and oriented x 3 and is verbally responsive. Pt denies SI. Pt does have occasional auditory hallucinations. Pt is compliant at this time.  Pt dressed in wine color scrubs, and wanded by security.    Pt has $25 in cash and 2 cell phones in her belongings.

## 2018-10-01 NOTE — ED Notes (Signed)
Patient brought over to acute area after geodon was administered.  Patient sleepy but would not allow nurse to draw lab specimens.  IVC papers not in hand at this time.

## 2018-10-01 NOTE — ED Provider Notes (Signed)
Patient report received from K. Ala DachFord, [place]. Patient is IVC'ed due to psychotic episode. Patient has received Geodon. Labs are pending to complete medical screening for TTS evaluation.  5:59 PM  Results for orders placed or performed during the hospital encounter of 10/01/18  Comprehensive metabolic panel  Result Value Ref Range   Sodium 141 135 - 145 mmol/L   Potassium 3.4 (L) 3.5 - 5.1 mmol/L   Chloride 106 98 - 111 mmol/L   CO2 29 22 - 32 mmol/L   Glucose, Bld 91 70 - 99 mg/dL   BUN 11 6 - 20 mg/dL   Creatinine, Ser 4.090.68 0.44 - 1.00 mg/dL   Calcium 9.4 8.9 - 81.110.3 mg/dL   Total Protein 6.6 6.5 - 8.1 g/dL   Albumin 4.1 3.5 - 5.0 g/dL   AST 19 15 - 41 U/L   ALT 13 0 - 44 U/L   Alkaline Phosphatase 42 38 - 126 U/L   Total Bilirubin 0.8 0.3 - 1.2 mg/dL   GFR calc non Af Amer >60 >60 mL/min   GFR calc Af Amer >60 >60 mL/min   Anion gap 6 5 - 15  Ethanol  Result Value Ref Range   Alcohol, Ethyl (B) <10 <10 mg/dL  CBC with Diff  Result Value Ref Range   WBC 4.1 4.0 - 10.5 K/uL   RBC 3.92 3.87 - 5.11 MIL/uL   Hemoglobin 11.3 (L) 12.0 - 15.0 g/dL   HCT 91.434.8 (L) 78.236.0 - 95.646.0 %   MCV 88.8 80.0 - 100.0 fL   MCH 28.8 26.0 - 34.0 pg   MCHC 32.5 30.0 - 36.0 g/dL   RDW 21.313.7 08.611.5 - 57.815.5 %   Platelets 287 150 - 400 K/uL   nRBC 0.0 0.0 - 0.2 %   Neutrophils Relative % 42 %   Neutro Abs 1.7 1.7 - 7.7 K/uL   Lymphocytes Relative 40 %   Lymphs Abs 1.7 0.7 - 4.0 K/uL   Monocytes Relative 11 %   Monocytes Absolute 0.5 0.1 - 1.0 K/uL   Eosinophils Relative 6 %   Eosinophils Absolute 0.2 0.0 - 0.5 K/uL   Basophils Relative 1 %   Basophils Absolute 0.0 0.0 - 0.1 K/uL   Immature Granulocytes 0 %   Abs Immature Granulocytes 0.01 0.00 - 0.07 K/uL  Acetaminophen level  Result Value Ref Range   Acetaminophen (Tylenol), Serum <10 (L) 10 - 30 ug/mL  Salicylate level  Result Value Ref Range   Salicylate Lvl <7.0 2.8 - 30.0 mg/dL  I-Stat beta hCG blood, ED  Result Value Ref Range   I-stat hCG,  quantitative <5.0 <5 mIU/mL   Comment 3           Labs reviewed. Stable mild anemia. Patient is medically cleared, IVC paperwork present.      Felicie MornSmith, Rodina Pinales, NP 10/01/18 Flossie [id number]    Linwood DibblesKnapp, Jon, MD 10/02/18 [phone]

## 2018-10-01 NOTE — ED Triage Notes (Signed)
Pt  arrived via EMS and was called out for disturbing behavior. Pt report that she has not been feeling good and wanted to come to the hospital. Pt has HX of schizophrenia, and bipolar disorder. Pt observed " muttering to her self"   V/s  116/74, HR 90, RR 16, O2 % 99

## 2018-10-01 NOTE — ED Notes (Signed)
Bed: [facility]42 Expected date:  Expected time:  Means of arrival:  Comments: 

## 2018-10-01 NOTE — ED Provider Notes (Signed)
[facility]-EMERGENCY DEPT Provider Note   CSN: [id number] Arrival date & time: 10/01/18  1225     History   Chief Complaint Chief Complaint  Patient presents with  . Medical Clearance    HPI [name] [name] is a 42 y.o. female.  [name] [name] is a 42 y.o. Female with a history of schizoaffective disorder, arthritis and depression, who presents via EMS after they were called out due to disturbing behavior, patient was reported to be repeatedly muttering to herself.  On arrival patient denies SI or HI but does appear to be responding to internal stimuli, repeatedly muttering to herself and shaking her hands.  She reports that she has not been feeling good and wanted to come to the hospital but denies any focal symptoms.  No chest pain, shortness of breath, abdominal pain, nausea, vomiting, fevers or recent illness, denies any urinary symptoms.  Is unsure if she has been taking her medications, and unwilling to provide more history or details regarding her visit today.  Repeatedly muttering to herself and refusing to talk further with this provider.  Chart review reveals similar presentations in the past for the same and multiple psychiatric hospitalizations.  Level V Caveat: Acute psychosis     Past Medical History:  Diagnosis Date  . Arthritis   . Depression     Patient Active Problem List   Diagnosis Date Noted  . Schizoaffective disorder, bipolar type (HCC) 09/01/2018    History reviewed. No pertinent surgical history.   OB History   None      Home Medications    Prior to Admission medications   Medication Sig Start Date End Date Taking? Authorizing Provider  hydrOXYzine (ATARAX/VISTARIL) 50 MG tablet Take 1 tablet (50 mg total) by mouth every 6 (six) hours as needed for anxiety. 09/06/18   Micheal Likensainville, Christopher T, MD  OLANZapine (ZYPREXA) 10 MG tablet Take 1 tablet (10 mg total) by mouth at bedtime. 09/06/18   Micheal Likensainville, Christopher T, MD    ondansetron (ZOFRAN-ODT) 4 MG disintegrating tablet Take 1 tablet (4 mg total) by mouth every 8 (eight) hours as needed for nausea or vomiting. 09/12/18   Benjiman CorePickering, Nathan, MD  traZODone (DESYREL) 50 MG tablet Take 1 tablet (50 mg total) by mouth at bedtime as needed for sleep. 09/06/18   Micheal Likensainville, Christopher T, MD    Family History History reviewed. No pertinent family history.  Social History Social History   Tobacco Use  . Smoking status: Never Smoker  . Smokeless tobacco: Never Used  Substance Use Topics  . Alcohol use: Not Currently  . Drug use: Not Currently     Allergies   Patient has no known allergies.   Review of Systems Review of Systems  Unable to perform ROS: Psychiatric disorder     Physical Exam Updated Vital Signs BP 116/70 (BP Location: Left Arm)   Pulse 88   Temp 98.9 F (37.2 C) (Oral)   Resp 18   LMP  (LMP Unknown)   SpO2 100%   Physical Exam  Constitutional: She appears well-developed and well-nourished. No distress.  HENT:  Head: Normocephalic and atraumatic.  Mouth/Throat: Oropharynx is clear and moist.  Eyes: Pupils are equal, round, and reactive to light. EOM are normal. Right eye exhibits no discharge. Left eye exhibits no discharge.  Neck: Neck supple.  Cardiovascular: Normal rate, regular rhythm, normal heart sounds and intact distal pulses.  Pulmonary/Chest: Effort normal and breath sounds normal. No respiratory distress. She has no wheezes. She  has no rales.  Respirations equal and unlabored, patient able to speak in full sentences, lungs clear to auscultation bilaterally  Abdominal: Soft. Bowel sounds are normal. She exhibits no distension and no mass. There is no tenderness. There is no guarding.  Abdomen soft, nondistended, nontender to palpation in all quadrants without guarding or peritoneal signs  Musculoskeletal: She exhibits no edema or deformity.  Neurological: She is alert. Coordination normal.  Speech is clear, able to  follow commands CN III-XII intact Normal strength in upper and lower extremities bilaterally including dorsiflexion and plantar flexion, strong and equal grip strength Sensation normal to light and sharp touch Moves extremities without ataxia, coordination intact   Skin: Skin is warm and dry. Capillary refill takes less than 2 seconds. She is not diaphoretic.  Psychiatric: Her affect is labile. She is agitated and actively hallucinating. She expresses no homicidal and no suicidal ideation. She expresses no suicidal plans and no homicidal plans. She is noncommunicative.  Nursing note and vitals reviewed.    ED Treatments / Results  Labs (all labs ordered are listed, but only abnormal results are displayed) Labs Reviewed  CBC WITH DIFFERENTIAL/PLATELET - Abnormal; Notable for the following components:      Result Value   Hemoglobin 11.3 (*)    HCT 34.8 (*)    All other components within normal limits  COMPREHENSIVE METABOLIC PANEL  ETHANOL  RAPID URINE DRUG SCREEN, HOSP PERFORMED  ACETAMINOPHEN LEVEL  SALICYLATE LEVEL  I-STAT BETA HCG BLOOD, ED ([facility], [facility], [facility] ONLY)    EKG None  Radiology No results found.  Procedures Procedures (including critical care time)  Medications Ordered in ED Medications  LORazepam (ATIVAN) injection 2 mg (0 mg Intravenous Hold 10/01/18 1516)  ziprasidone (GEODON) injection 20 mg (20 mg Intramuscular Given 10/01/18 1432)  sterile water (preservative free) injection (  Given 10/01/18 1432)     Initial Impression / Assessment and Plan / ED Course  I have reviewed the triage vital signs and the nursing notes.  Pertinent labs & imaging results that were available during my care of the patient were reviewed by me and considered in my medical decision making (see chart for details).  Patient presents actively hallucinating and responding to internal stimuli.  Known history of schizophrenia with multiple similar presentations.  Denies any focal medical  symptoms and has normal vitals on arrival.  Will get EKG and medical screening labs.  Notified by nursing that hallucinations are escalating and patient is becoming agitated, screaming and yelling in the room.  Will give 20 of IM Geodon, patient is also refusing lab draw for further evaluation, feel the patient is acutely decompensated from her baseline due to active and escalating hallucinations, feel she could be a danger to herself or others and will place patient under IVC.  Medical screening labs collected and after Geodon patient's agitation is significantly improving and she is sleeping comfortably.  At shift change care signed out to NP Felicie Morn who will follow up on the remainder of patient's medical screening labs.  TTS consult has been placed, awaiting behavioral health recommendations for appropriate disposition.  Final Clinical Impressions(s) / ED Diagnoses   Final diagnoses:  Hallucinations    ED Discharge Orders    None       Dartha Lodge, [place] 10/01/18 1706    Geoffery Lyons, MD 10/02/18 2113

## 2018-10-01 NOTE — ED Notes (Signed)
Pt screaming in the room stating that someone is trying to kill her. Pt was crying and then would have moments in which she would become angry. Pt became very irate when nurse was trying to obtain blood.

## 2018-10-02 DIAGNOSIS — F250 Schizoaffective disorder, bipolar type: Secondary | ICD-10-CM | POA: Diagnosis not present

## 2018-10-02 MED ORDER — TRAZODONE HCL 50 MG PO TABS
50.0000 mg | ORAL_TABLET | Freq: Every evening | ORAL | Status: DC | PRN
  Administered 2018-10-02: 50 mg via ORAL
  Filled 2018-10-02: qty 1

## 2018-10-02 MED ORDER — OLANZAPINE 10 MG PO TBDP
10.0000 mg | ORAL_TABLET | Freq: Every day | ORAL | Status: DC
  Administered 2018-10-02: 10 mg via ORAL
  Filled 2018-10-02: qty 1

## 2018-10-02 MED ORDER — OLANZAPINE 5 MG PO TBDP
5.0000 mg | ORAL_TABLET | Freq: Once | ORAL | Status: AC
  Administered 2018-10-02: 5 mg via ORAL
  Filled 2018-10-02: qty 1

## 2018-10-02 NOTE — ED Notes (Signed)
Patient has been calm and cooperative all day.  She occasionally appears to be responding to internal stimuli.  She denies S/I and H/I.  15 minute checks and video monitoring in place.

## 2018-10-02 NOTE — ED Notes (Signed)
Pt was observed mumbling to self during 15 minute checks. Pt is alert and laying in bed with eyes open. Will continue to monitor.

## 2018-10-02 NOTE — ED Notes (Signed)
Refused vital signs

## 2018-10-02 NOTE — ED Notes (Signed)
Pt refused ekg

## 2018-10-02 NOTE — ED Notes (Signed)
Pt alert, pt denies any si, hi, or avh. Pt resting in the bed quietly. Pt refuses to answer questions. Pt resting in bed. Will continue to monitor.

## 2018-10-02 NOTE — BH Assessment (Signed)
Assessment Note  [name] [name] is an 42 y.o. female. Pt was a poor historian. Per Pt she was found in a store crying. The Pt states that she was crying because her "jacket was torn." Per Pt she has been diagnosed with Schizophrenia but she wants the physician to change the diagnosis because she states she is only depressed. Pt denies current outpatient treatment. Pt denies current outpatient medications. Pt states she is tired of the police picking her up.  Dr. [name] and Feliz Beam, NP recommend overnight observation for safety and stabilization.   Diagnosis:  F20.9 Schizophrenia   Past Medical History:  Past Medical History:  Diagnosis Date  . Arthritis   . Depression     History reviewed. No pertinent surgical history.  Family History: History reviewed. No pertinent family history.  Social History:  reports that she has never smoked. She has never used smokeless tobacco. She reports that she drank alcohol. She reports that she has current or past drug history.  Additional Social History:  Alcohol / Drug Use Pain Medications: please see mar Prescriptions: please see mar Over the Counter: please see mar History of alcohol / drug use?: No history of alcohol / drug abuse Longest period of sobriety (when/how long): NA  CIWA: CIWA-Ar BP: 116/70 Pulse Rate: 88 COWS:    Allergies: No Known Allergies  Home Medications:  (Not in a hospital admission)  OB/GYN Status:  No LMP recorded (lmp unknown).  General Assessment Data [facility] of [facility]: [facility] TTS Assessment: In system Is this a Tele or Face-to-Face Assessment?: Face-to-Face Is this an Initial Assessment or a Re-assessment for this encounter?: Initial Assessment Patient Accompanied by:: N/A Language Other than English: No Living Arrangements: Homeless/Shelter What gender do you identify as?: Female Marital status: Single Maiden name: Siguenza Pregnancy Status: No Living Arrangements: Other (Comment) Can pt return to  current living arrangement?: Yes Admission Status: Voluntary Is patient capable of signing voluntary admission?: Yes Referral Source: Self/Family/Friend Insurance type: Medicare     Crisis Care Plan Living Arrangements: Other (Comment) Legal Guardian: Other:(self) Name of Psychiatrist: [facility] Name of Therapist: [facility]  Education Status Is patient currently in school?: No Current Grade: NA Highest grade of school patient has completed: NA Name of school: NA Contact person: NA IEP information if applicable: NA Is the patient employed, unemployed or receiving disability?: Receiving disability income  Risk to self with the past 6 months Suicidal Ideation: No Has patient been a risk to self within the past 6 months prior to admission? : No Suicidal Intent: No Has patient had any suicidal intent within the past 6 months prior to admission? : No Is patient at risk for suicide?: No Suicidal Plan?: No Has patient had any suicidal plan within the past 6 months prior to admission? : No Access to Means: No What has been your use of drugs/alcohol within the last 12 months?: SA Previous Attempts/Gestures: No How many times?: 0 Other Self Harm Risks: NA Triggers for Past Attempts: Unknown Intentional Self Injurious Behavior: None Comment - Self Injurious Behavior: NA Family Suicide History: No Recent stressful life event(s): Other (Comment)(unknown) Persecutory voices/beliefs?: No Depression: No Depression Symptoms: (none stated) Substance abuse history and/or treatment for substance abuse?: No Suicide prevention information given to non-admitted patients: Not applicable  Risk to Others within the past 6 months Homicidal Ideation: No Does patient have any lifetime risk of violence toward others beyond the six months prior to admission? : No Thoughts of Harm to Others: No Current Homicidal  Intent: No Current Homicidal Plan: No Access to Homicidal Means: No Identified Victim:  NA History of harm to others?: No Assessment of Violence: None Noted Violent Behavior Description: NA Does patient have access to weapons?: No Criminal Charges Pending?: Yes Describe Pending Criminal Charges: misuse of 911 Does patient have a court date: No Is patient on probation?: No  Psychosis Hallucinations: Auditory, Visual Delusions: Unspecified  Mental Status Report Appearance/Hygiene: In scrubs Eye Contact: Fair Motor Activity: Freedom of movement Speech: Incoherent, Slow Level of Consciousness: Drowsy Mood: Anxious Affect: Anxious Anxiety Level: Minimal Thought Processes: Tangential Judgement: Impaired Orientation: Not oriented Obsessive Compulsive Thoughts/Behaviors: None  Cognitive Functioning Concentration: Normal Memory: Remote Impaired, Recent Impaired Is patient IDD: No Insight: Poor Impulse Control: Poor Appetite: Fair Have you had any weight changes? : No Change Sleep: No Change Total Hours of Sleep: 8 Vegetative Symptoms: None  ADLScreening [facility] Assessment Services) Patient's cognitive ability adequate to safely complete daily activities?: No Patient able to express need for assistance with ADLs?: Yes Independently performs ADLs?: Yes (appropriate for developmental age)  Prior Inpatient Therapy Prior Inpatient Therapy: Yes Prior Therapy Dates: 2019 [facility] Reason for Treatment: MH issues  Prior Outpatient Therapy Prior Outpatient Therapy: Yes Prior Therapy Dates: Ongoing Prior Therapy Facilty/Provider(s): [facility] Reason for Treatment: Med mang Does patient have an ACCT team?: Yes Does patient have Intensive In-House Services?  : No Does patient have [facility] services? : Yes Does patient have P4CC services?: No  ADL Screening (condition at time of admission) Patient's cognitive ability adequate to safely complete daily activities?: No Is the patient deaf or have difficulty hearing?: No Does the  patient have difficulty seeing, even when wearing glasses/contacts?: No Does the patient have difficulty concentrating, remembering, or making decisions?: No Patient able to express need for assistance with ADLs?: Yes Does the patient have difficulty dressing or bathing?: No Independently performs ADLs?: Yes (appropriate for developmental age) Does the patient have difficulty walking or climbing stairs?: No       Abuse/Neglect Assessment (Assessment to be complete while patient is alone) Abuse/Neglect Assessment Can Be Completed: Yes Physical Abuse: Denies Verbal Abuse: Denies Sexual Abuse: Denies Exploitation of patient/patient's resources: Denies     Merchant navy officerAdvance Directives (For Healthcare) Does Patient Have a Medical Advance Directive?: No Does patient want to make changes to medical advance directive?: No - Patient declined Would patient like information on creating a medical advance directive?: No - Patient declined          Disposition:  Disposition Initial Assessment Completed for this Encounter: Yes  On Site Evaluation by:   Reviewed with Physician:    Wolfgang PhoenixLevette,Cade Olberding D 10/02/2018 9:03 AM

## 2018-10-02 NOTE — BH Assessment (Signed)
[facility]  Per Juanetta BeetsJacqueline Norman, DO, this [facility] for further observation and stabilization.  Pt presents under IVC initiated by EDP Geoffery Lyonsouglas Delo, MD.  Pt's nurse, Edie, has been notified.  Doylene Canninghomas Karalyne Nusser, [place] Behavioral Health Coordinator [phone]

## 2018-10-02 NOTE — ED Notes (Addendum)
Pt appears manic. Pt came from bathroom stating "I clogged up the toilet."  Pt requested items for shower and shower supplies given. Pt was redirected from using bathroom with clogged toilet, pt continued to use bathroom for brushing hair, hyper focused on using select bathroom.

## 2018-10-03 DIAGNOSIS — F250 Schizoaffective disorder, bipolar type: Secondary | ICD-10-CM | POA: Diagnosis not present

## 2018-10-03 DIAGNOSIS — R443 Hallucinations, unspecified: Secondary | ICD-10-CM

## 2018-10-03 MED ORDER — DIPHENHYDRAMINE HCL 50 MG/ML IJ SOLN
50.0000 mg | Freq: Once | INTRAMUSCULAR | Status: AC
  Administered 2018-10-03: 50 mg via INTRAMUSCULAR
  Filled 2018-10-03: qty 1

## 2018-10-03 MED ORDER — STERILE WATER FOR INJECTION IJ SOLN
INTRAMUSCULAR | Status: AC
  Administered 2018-10-03: 10:00:00
  Filled 2018-10-03: qty 10

## 2018-10-03 MED ORDER — ZIPRASIDONE MESYLATE 20 MG IM SOLR
20.0000 mg | Freq: Once | INTRAMUSCULAR | Status: AC
  Administered 2018-10-03: 20 mg via INTRAMUSCULAR
  Filled 2018-10-03: qty 20

## 2018-10-03 NOTE — ED Notes (Signed)
Pt d/c home per MD order. Discharge summary reviewed with pt. Pt  Signed e-signature. Personal property returned. Pt ambulatory off unit with [facility].

## 2018-10-03 NOTE — ED Notes (Signed)
Pt released from seclusion room  at 1050

## 2018-10-03 NOTE — ED Notes (Signed)
Pt taking a shower 

## 2018-10-03 NOTE — Consult Note (Addendum)
[facility] ED Discharge  10/03/2018 12:24 PM [name] [name]  MRN:  [id number] Principal Problem: Schizoaffective disorder, bipolar type [facility]) Discharge Diagnoses: Principal Problem:   Schizoaffective disorder, bipolar type (HCC)   Subjective: Pt was seen and chart reviewed with treatment team and Dr [name]. Pt denies suicidal/homicidal ideation, denies auditory/visual hallucinations and does not appear to be responding to internal stimuli. Pt was in the hallway of the unit, acting out and screaming "I want a shower, I want a shower." She was loud and intrusive. She knocked over the bedside table in her room and then went into the bathroom screaming at the top of her lungs. She was given emergency medications and placed in the seclusion room at 1005. She was observed at 1030 in NAD and asleep. She was taken out of the seclusion room and went to her room and went to sleep. Pt has been refusing to have VS taken and refuses to answer questions. She also refused to give a urine specimen for testing of illicit substances. If she becomes upset she starts yelling and screaming and destroying property. Pt is well known to this ED system and has had 12 visits in the past 6 months. She was admitted to [facility] from 10-28 to 11-1, 2019. Pt has a community support team with [employer]. Pt's has been determined to acting out instead of psychiatrically ill and is psychiatrically clear for discharge.    Total Time spent with patient: 30 minutes  Past Psychiatric History: As above  Past Medical History:  Past Medical History:  Diagnosis Date  . Arthritis   . Depression    History reviewed. No pertinent surgical history. Family History: History reviewed. No pertinent family history. Family Psychiatric  History: None per chart review.  Social History:  Social History   Substance and Sexual Activity  Alcohol Use Not Currently    Social History   Substance and Sexual Activity  Drug Use Not  Currently   Social History   Socioeconomic History  . Marital status: Single    Spouse name: Not on file  . Number of children: Not on file  . Years of education: Not on file  . Highest education level: Not on file  Occupational History  . Not on file  Social Needs  . Financial resource strain: Not on file  . Food insecurity:    Worry: Not on file    Inability: Not on file  . Transportation needs:    Medical: Not on file    Non-medical: Not on file  Tobacco Use  . Smoking status: Never Smoker  . Smokeless tobacco: Never Used  Substance and Sexual Activity  . Alcohol use: Not Currently  . Drug use: Not Currently  . Sexual activity: Not Currently  Lifestyle  . Physical activity:    Days per week: Not on file    Minutes per session: Not on file  . Stress: Not on file  Relationships  . Social connections:    Talks on phone: Not on file    Gets together: Not on file    Attends religious service: Not on file    Active member of club or organization: Not on file    Attends meetings of clubs or organizations: Not on file    Relationship status: Not on file  Other Topics Concern  . Not on file  Social History Narrative  . Not on file    Current Medications: Current Facility-Administered Medications  Medication Dose Route Frequency Provider  Last Rate Last Dose  . LORazepam (ATIVAN) injection 2 mg  2 mg Intravenous Once Maryagnes AmosStarkes-Perry, Takia S, FNP   Stopped at 10/01/18 1516  . OLANZapine zydis (ZYPREXA) disintegrating tablet 10 mg  10 mg Oral QHS Cherly Beachorman, Candita Borenstein J, DO   10 mg at 10/02/18 2114  . traZODone (DESYREL) tablet 50 mg  50 mg Oral QHS PRN Cherly Beachorman, Cissy Galbreath J, DO   50 mg at 10/02/18 2114   Current Outpatient Medications  Medication Sig Dispense Refill  . hydrOXYzine (ATARAX/VISTARIL) 50 MG tablet Take 1 tablet (50 mg total) by mouth every 6 (six) hours as needed for anxiety. 30 tablet 0  . OLANZapine (ZYPREXA) 10 MG tablet Take 1 tablet (10 mg total) by mouth  at bedtime. 30 tablet 0  . ondansetron (ZOFRAN-ODT) 4 MG disintegrating tablet Take 1 tablet (4 mg total) by mouth every 8 (eight) hours as needed for nausea or vomiting. 8 tablet 0  . traZODone (DESYREL) 50 MG tablet Take 1 tablet (50 mg total) by mouth at bedtime as needed for sleep. 30 tablet 0    Musculoskeletal: Strength & Muscle Tone: within normal limits Gait & Station: normal Patient leans: N/A  Psychiatric Specialty Exam: Physical Exam  Nursing note and vitals reviewed. Constitutional: She is oriented to person, place, and time. She appears well-developed and well-nourished.  HENT:  Head: Normocephalic and atraumatic.  Neck: Normal range of motion.  Respiratory: Effort normal.  Musculoskeletal: Normal range of motion.  Neurological: She is alert and oriented to person, place, and time.  Psychiatric: Her speech is normal. Thought content normal. Her affect is labile. She is agitated. Cognition and memory are normal. She expresses impulsivity.    Review of Systems  Psychiatric/Behavioral: Negative for suicidal ideas.  All other systems reviewed and are negative.   Blood pressure 113/70, pulse 88, temperature 98.7 F (37.1 C), temperature source Oral, resp. rate 13, SpO2 100 %.There is no height or weight on file to calculate BMI.  General Appearance: Casual  Eye Contact:  Fair  Speech:  Pressured  Volume:  Increased  Mood:  Angry, Anxious and Irritable  Affect:  Congruent and Labile  Thought Process:  Coherent, Goal Directed, Linear and Descriptions of Associations: Intact  Orientation:  Full (Time, Place, and Person)  Thought Content:  Rumination  Suicidal Thoughts:  No  Homicidal Thoughts:  No  Memory:  Immediate;   Good Recent;   Fair Remote;   Fair  Judgement:  Poor  Insight:  Fair  Psychomotor Activity:  Increased  Concentration:  Concentration: Fair and Attention Span: Fair  Recall:  Good  Fund of Knowledge:  Good  Language:  Good  Akathisia:  No   Handed:  Right  AIMS (if indicated):   N/A  Assets:  ArchitectCommunication Skills Financial Resources/Insurance Housing Social Support  ADL's:  Intact  Cognition:  WNL  Sleep:   N/A     Demographic Factors:  Low socioeconomic status and Unemployed  Loss Factors: NA  Historical Factors: NA  Risk Reduction Factors:   Sense of responsibility to family  Continued Clinical Symptoms:  Severe Anxiety and/or Agitation  Cognitive Features That Contribute To Risk:  Closed-mindedness    Suicide Risk:  Minimal: No identifiable suicidal ideation.  Patients presenting with no risk factors but with morbid ruminations; may be classified as minimal risk based on the severity of the depressive symptoms  Follow-up Information    Placey, Chales AbrahamsMary Ann, NP Follow up.   Why:  Please go see Corrie DandyMary  Ann at the [facility]. Contact information: [address] [place] [address] [phone]           Plan Of Care/Follow-up recommendations:  Activity:  as tolerated Diet:  Heart healthy  Disposition: Schizoaffective disorder, bipolar type Take all medications as prescribed by your outpatient provider. Keep all follow-up appointments as scheduled with your community support team with [facility].  Do not consume alcohol or use illegal drugs while on prescription medications. Report any adverse effects from your medications to your primary care provider promptly.  In the event of recurrent symptoms or worsening symptoms, call 911, a crisis hotline, or go to the nearest emergency department for evaluation.  Laveda Abbe, NP 10/03/2018, 12:24 PM    Patient seen face-to-face for psychiatric evaluation, chart reviewed and case discussed with the physician extender and developed treatment plan. Reviewed the information documented and agree with the treatment plan.  Juanetta Beets, DO 10/03/18 1:07 PM

## 2018-10-03 NOTE — Discharge Instructions (Signed)
For your behavioral health needs, you are advised to [facility] treatment with the [facility]:       [facility]      [facility], [address]      [address]      [place], [place]  [id number]      [phone]

## 2018-10-03 NOTE — ED Notes (Signed)
This nurse to give IM medication to pt per MD order. Security, [employer] present. Pt agitated,  Pt yelling, screaming, kicking, hitting, security and [employer] hands on for this nurse to administer medication. Medication administered, pt continues to yell, kick, scream, pt spit on Engineer, materialssecurity officer. Pt behavior not redirectable . Pt escorted to seclusion . Seclusion initiated at 1005.  Jacki ConesLaurie NP made aware.

## 2018-10-03 NOTE — BH Assessment (Signed)
[facility]  Per Juanetta BeetsJacqueline Norman, DO, this pt does not require psychiatric hospitalization at this time.  Pt presents under IVC initiated by EDP Geoffery Lyonsouglas Delo, MD, which Dr Sharma CovertNorman has rescinded.  [facility].  This has been included in pt's discharge instructions.  Pt's nurse, Morrie Sheldonshley, has been notified.  Doylene Canninghomas Amila Callies, MA Triage Specialist [phone]

## 2018-10-03 NOTE — ED Notes (Signed)
Pt in room, actively psychotic, yelling, screaming, hallucinating. This nurse in room to assess situation, pt inconsolable, slammed bedside table, throwing breakfast tray. This nurse notified psych of pt behaviors, awaiting orders.

## 2018-12-12 DIAGNOSIS — G2401 Drug induced subacute dyskinesia: Secondary | ICD-10-CM | POA: Diagnosis not present

## 2018-12-12 DIAGNOSIS — F209 Schizophrenia, unspecified: Secondary | ICD-10-CM | POA: Diagnosis not present

## 2018-12-12 DIAGNOSIS — Z599 Problem related to housing and economic circumstances, unspecified: Secondary | ICD-10-CM | POA: Diagnosis not present

## 2018-12-12 DIAGNOSIS — Z653 Problems related to other legal circumstances: Secondary | ICD-10-CM | POA: Diagnosis not present

## 2018-12-12 DIAGNOSIS — Z758 Other problems related to medical facilities and other health care: Secondary | ICD-10-CM | POA: Diagnosis not present

## 2018-12-12 DIAGNOSIS — Z5689 Other problems related to employment: Secondary | ICD-10-CM | POA: Diagnosis not present

## 2018-12-12 DIAGNOSIS — Z609 Problem related to social environment, unspecified: Secondary | ICD-10-CM | POA: Diagnosis not present

## 2018-12-12 DIAGNOSIS — R636 Underweight: Secondary | ICD-10-CM | POA: Diagnosis not present

## 2019-06-10 ENCOUNTER — Encounter (HOSPITAL_COMMUNITY): Payer: Self-pay | Admitting: Emergency Medicine

## 2019-06-10 ENCOUNTER — Emergency Department (HOSPITAL_COMMUNITY)
Admission: EM | Admit: 2019-06-10 | Discharge: 2019-06-11 | Disposition: A | Discharge status: Home / Self Care | Payer: Medicare Other | Attending: Emergency Medicine | Admitting: Emergency Medicine

## 2019-06-10 DIAGNOSIS — R110 Nausea: Secondary | ICD-10-CM | POA: Diagnosis not present

## 2019-06-10 DIAGNOSIS — Z5900 Homelessness unspecified: Secondary | ICD-10-CM

## 2019-06-10 DIAGNOSIS — F331 Major depressive disorder, recurrent, moderate: Secondary | ICD-10-CM | POA: Insufficient documentation

## 2019-06-10 DIAGNOSIS — R45851 Suicidal ideations: Secondary | ICD-10-CM | POA: Insufficient documentation

## 2019-06-10 DIAGNOSIS — F329 Major depressive disorder, single episode, unspecified: Secondary | ICD-10-CM | POA: Diagnosis not present

## 2019-06-10 DIAGNOSIS — Z79899 Other long term (current) drug therapy: Secondary | ICD-10-CM | POA: Diagnosis not present

## 2019-06-10 DIAGNOSIS — R5381 Other malaise: Secondary | ICD-10-CM | POA: Diagnosis not present

## 2019-06-10 NOTE — ED Triage Notes (Signed)
Patient here from homeless shelter via [place] [facility] with complaints of depression. Reports that patient became upset that they didn't have any beds and tried to go to [facility] and they refused here stating they are not a homeless shelter.

## 2019-06-11 ENCOUNTER — Encounter (HOSPITAL_COMMUNITY): Payer: Self-pay | Admitting: Student

## 2019-06-11 DIAGNOSIS — F331 Major depressive disorder, recurrent, moderate: Secondary | ICD-10-CM | POA: Diagnosis not present

## 2019-06-11 DIAGNOSIS — Z20828 Contact with and (suspected) exposure to other viral communicable diseases: Secondary | ICD-10-CM | POA: Diagnosis not present

## 2019-06-11 LAB — CBC
HCT: 40.3 % (ref 36.0–46.0)
Hemoglobin: 12.7 g/dL (ref 12.0–15.0)
MCH: 28.6 pg (ref 26.0–34.0)
MCHC: 31.5 g/dL (ref 30.0–36.0)
MCV: 90.8 fL (ref 80.0–100.0)
Platelets: 289 10*3/uL (ref 150–400)
RBC: 4.44 MIL/uL (ref 3.87–5.11)
RDW: 13.1 % (ref 11.5–15.5)
WBC: 5.3 10*3/uL (ref 4.0–10.5)
nRBC: 0 % (ref 0.0–0.2)

## 2019-06-11 LAB — COMPREHENSIVE METABOLIC PANEL
ALT: 16 U/L (ref 0–44)
AST: 18 U/L (ref 15–41)
Albumin: 4.8 g/dL (ref 3.5–5.0)
Alkaline Phosphatase: 64 U/L (ref 38–126)
Anion gap: 8 (ref 5–15)
BUN: 14 mg/dL (ref 6–20)
CO2: 29 mmol/L (ref 22–32)
Calcium: 9.4 mg/dL (ref 8.9–10.3)
Chloride: 103 mmol/L (ref 98–111)
Creatinine, Ser: 0.52 mg/dL (ref 0.44–1.00)
GFR calc Af Amer: >60 mL/min (ref >60)
GFR calc non Af Amer: >60 mL/min (ref >60)
Glucose, Bld: 71 mg/dL (ref 70–99)
Potassium: 2.9 mmol/L — ABNORMAL LOW (ref 3.5–5.1)
Sodium: 140 mmol/L (ref 135–145)
Total Bilirubin: 0.8 mg/dL (ref 0.3–1.2)
Total Protein: 8.2 g/dL — ABNORMAL HIGH (ref 6.5–8.1)

## 2019-06-11 LAB — I-STAT BETA HCG BLOOD, ED (MC, WL, AP ONLY): I-stat hCG, quantitative: <5 m[IU]/mL (ref <5)

## 2019-06-11 LAB — RAPID URINE DRUG SCREEN, HOSP PERFORMED
Amphetamines: NOT DETECTED
Barbiturates: NOT DETECTED
Benzodiazepines: NOT DETECTED
Cocaine: NOT DETECTED
Opiates: NOT DETECTED
Tetrahydrocannabinol: NOT DETECTED

## 2019-06-11 LAB — ETHANOL: Alcohol, Ethyl (B): <10 mg/dL (ref <10)

## 2019-06-11 MED ORDER — POTASSIUM CHLORIDE CRYS ER 20 MEQ PO TBCR: 20.0000 meq | EXTENDED_RELEASE_TABLET | Freq: Every day | ORAL | 0 refills | Status: DC

## 2019-06-11 MED ORDER — ALUM & MAG HYDROXIDE-SIMETH 200-200-20 MG/5ML PO SUSP: 30.0000 mL | Freq: Four times a day (QID) | ORAL | Status: DC | PRN

## 2019-06-11 MED ORDER — POTASSIUM CHLORIDE CRYS ER 20 MEQ PO TBCR
60.0000 meq | EXTENDED_RELEASE_TABLET | Freq: Once | ORAL | Status: AC
  Administered 2019-06-11: 60 meq via ORAL
  Filled 2019-06-11: qty 3

## 2019-06-11 MED ORDER — ONDANSETRON HCL 4 MG PO TABS: 4.0000 mg | ORAL_TABLET | Freq: Three times a day (TID) | ORAL | Status: DC | PRN

## 2019-06-11 MED ORDER — NICOTINE 21 MG/24HR TD PT24: 21.0000 mg | MEDICATED_PATCH | Freq: Every day | TRANSDERMAL | Status: DC

## 2019-06-11 MED ORDER — ZOLPIDEM TARTRATE 5 MG PO TABS: 5.0000 mg | ORAL_TABLET | Freq: Every evening | ORAL | Status: DC | PRN

## 2019-06-11 MED ORDER — ACETAMINOPHEN 325 MG PO TABS: 650.0000 mg | ORAL_TABLET | ORAL | Status: DC | PRN

## 2019-06-11 NOTE — Progress Notes (Signed)
Received [name] [name] from the main ED, she was oriented to her new environment, her belongings were placed in the [facility] overflow room. She was given a snack and immediately drifted off to sleep. She talked with TTS. She was medicated per order with potassium for a level of 2.9.

## 2019-06-11 NOTE — BH Assessment (Signed)
[facility] Assessment Progress Note  Per Anette Riedel, NP , this pt does not require psychiatric hospitalization at this time.  Pt is to be discharged from [facility] with recommendation to follow up with the [facility] ACT Team.  This has been included in pt's discharge instructions.  Pt's nurse, Diane, has been notified.  Jalene Mullet, [place] Triage Specialist [phone]

## 2019-06-11 NOTE — Discharge Instructions (Addendum)
You were seen in the emergency department today for depression.  Your potassium was somewhat low, we have provided you with a supplement and diet recommendations.  Please take this as prescribed. We have prescribed you new medication(s) today. Discuss the medications prescribed today with your pharmacist as they can have adverse effects and interactions with your other medicines including over the counter and prescribed medications. Seek medical evaluation if you start to experience new or abnormal symptoms after taking one of these medicines, seek care immediately if you start to experience difficulty breathing, feeling of your throat closing, facial swelling, or rash as these could be indications of a more serious allergic reaction   Please call your care coordinator this morning to discuss housing options. We have also provided our shelter guide for your reference.  Please follow-up with the resources, please follow-up with your primary care provider.  Return to the ER for new or worsening symptoms or any other concerns.  For your behavioral health needs, you are advised to continue treatment with the [facility] ACT Team:       Psychotherapeutic Services ACT Team      The [facility], [address]      [address]      [place], [place]  [address]      [phone]      Crisis number: [phone]

## 2019-06-11 NOTE — ED Provider Notes (Signed)
[facility]-EMERGENCY DEPT Provider Note   CSN: [id number] Arrival date & time: 06/10/19  2008     History   Chief Complaint Chief Complaint  Patient presents with  . Homeless  . Depression    HPI [name] [name] is a 43 y.o. female with a hx of depression, schizoaffective disorder, & arthritis who presents to the ED w/ complaints of depression & SI.  Patient states that she recently became homeless as of today which is led to her feeling depressed.  She states that she cannot tell me where she was previously staying as she does not feel comfortable doing so.  She states she currently has nowhere to stay.  She reports her home situation has led her to feel depressed and she has had thoughts of self-harm, no specific plan or attempts.  Other than her homelessness no alleviating or elevating factors.  She denies HI or hallucinations.  Denies chest pain, dyspnea, abdominal pain, vomiting, fever, or chills.     HPI  Past Medical History:  Diagnosis Date  . Arthritis   . Depression     Patient Active Problem List   Diagnosis Date Noted  . Hallucinations   . Schizoaffective disorder, bipolar type (HCC) 09/01/2018    History reviewed. No pertinent surgical history.   OB History   No obstetric history on file.      Home Medications    Prior to Admission medications   Medication Sig Start Date End Date Taking? Authorizing Provider  OLANZapine (ZYPREXA) 10 MG tablet Take 1 tablet (10 mg total) by mouth at bedtime. 09/06/18  Yes Micheal Likensainville, Christopher T, MD  sertraline (ZOLOFT) 100 MG tablet Take 100 mg by mouth daily. 04/06/19  Yes [provider]  hydrOXYzine (ATARAX/VISTARIL) 50 MG tablet Take 1 tablet (50 mg total) by mouth every 6 (six) hours as needed for anxiety. Patient not taking: Reported on 06/11/2019 09/06/18   Micheal Likensainville, Christopher T, MD  ondansetron (ZOFRAN-ODT) 4 MG disintegrating tablet Take 1 tablet (4 mg total) by mouth every 8 (eight)  hours as needed for nausea or vomiting. Patient not taking: Reported on 06/11/2019 09/12/18   Benjiman CorePickering, Nathan, MD  traZODone (DESYREL) 50 MG tablet Take 1 tablet (50 mg total) by mouth at bedtime as needed for sleep. Patient not taking: Reported on 06/11/2019 09/06/18   Micheal Likensainville, Christopher T, MD    Family History No family history on file.  Social History Social History   Tobacco Use  . Smoking status: Never Smoker  . Smokeless tobacco: Never Used  Substance Use Topics  . Alcohol use: Not Currently  . Drug use: Not Currently     Allergies   Patient has no known allergies.   Review of Systems Review of Systems  Constitutional: Negative for chills and fever.  Respiratory: Negative for shortness of breath.   Cardiovascular: Negative for chest pain.  Gastrointestinal: Negative for abdominal pain, diarrhea and vomiting.  Genitourinary: Negative for dysuria.  Neurological: Negative for syncope.  Psychiatric/Behavioral: Positive for suicidal ideas. Negative for hallucinations.       Negative for HI  All other systems reviewed and are negative.  Physical Exam Updated Vital Signs BP 118/60 (BP Location: Right Arm)   Pulse 100   Temp 98.6 F (37 C) (Oral)   Resp 20   SpO2 95%   Physical Exam Vitals signs and nursing note reviewed.  Constitutional:      General: She is not in acute distress.    Appearance: She is  well-developed. She is not toxic-appearing.  HENT:     Head: Normocephalic and atraumatic.  Eyes:     General:        Right eye: No discharge.        Left eye: No discharge.     Conjunctiva/sclera: Conjunctivae normal.  Neck:     Musculoskeletal: Neck supple.  Cardiovascular:     Rate and Rhythm: Normal rate and regular rhythm.  Pulmonary:     Effort: Pulmonary effort is normal. No respiratory distress.     Breath sounds: Normal breath sounds. No wheezing, rhonchi or rales.  Abdominal:     General: There is no distension.     Palpations: Abdomen is  soft.     Tenderness: There is no abdominal tenderness. There is no guarding.  Skin:    General: Skin is warm and dry.     Findings: No rash.  Neurological:     Mental Status: She is alert.     Comments: Clear speech.   Psychiatric:        Behavior: Behavior normal.        Thought Content: Thought content includes suicidal ideation. Thought content does not include homicidal ideation. Thought content does not include homicidal or suicidal plan.    ED Treatments / Results  Labs (all labs ordered are listed, but only abnormal results are displayed) Labs Reviewed  COMPREHENSIVE METABOLIC PANEL - Abnormal; Notable for the following components:      Result Value   Potassium 2.9 (*)    Total Protein 8.2 (*)    All other components within normal limits  CBC  ETHANOL  RAPID URINE DRUG SCREEN, HOSP PERFORMED  I-STAT BETA HCG BLOOD, ED ([facility], [facility], [facility] ONLY)    EKG None  Radiology No results found.  Procedures Procedures (including critical care time)  Medications Ordered in ED Medications  acetaminophen (TYLENOL) tablet 650 mg (has no administration in time range)  zolpidem (AMBIEN) tablet 5 mg (has no administration in time range)  ondansetron (ZOFRAN) tablet 4 mg (has no administration in time range)  alum & mag hydroxide-simeth (MAALOX/MYLANTA) 200-200-20 MG/5ML suspension 30 mL (has no administration in time range)  nicotine (NICODERM CQ - dosed in mg/24 hours) patch 21 mg (has no administration in time range)  potassium chloride SA (K-DUR) CR tablet 60 mEq (60 mEq Oral Given 06/11/19 0523)     Initial Impression / Assessment and Plan / ED Course  I have reviewed the triage vital signs and the nursing notes.  Pertinent labs & imaging results that were available during my care of the patient were reviewed by me and considered in my medical decision making (see chart for details).   Patient presents to the emergency department with complaints of depression and SI without plan  secondary to homelessness.  She is nontoxic-appearing, no apparent distress, vitals WNL.  Screening labs remarkable for hypokalemia, oral replacement ordered.   Patient medically cleared.  Consult placed to TTS.  Disposition per [facility].  Discussed w/ counselor Curlene Dolphin who has spoken with Anette Riedel NP- does not meet inpatient criteria, recommendation for follow up with her care coordinator. Patient states she feels she will be able to contact care coordinator this AM to help set up housing. She was able to eat and rest in the ED. Appears appropriate for discharge @ this time. Resources were provided. Short course of potassium supplement. I discussed results, treatment plan, need for follow-up, and return precautions with the patient. Provided opportunity for  questions, patient confirmed understanding and is in agreement with plan.    Final Clinical Impressions(s) / ED Diagnoses   Final diagnoses:  Homeless    ED Discharge Orders         Ordered    potassium chloride SA (K-DUR) 20 MEQ tablet  Daily     06/11/19 0630           Lenoir Facchini, Pleas KochSamantha R, PA-C 06/11/19 0636    Ward, Layla MawKristen N, DO 06/11/19 [phone]

## 2019-06-11 NOTE — ED Notes (Signed)
Pt discharged home. Discharged instructions read to pt who verbalized understanding. All belongings returned to pt. Pt had large heavy bags with all of her belongings including a black purse. She had to use the hospital telephone to call a ride. She voiced no complaints about the belongings.  Denies SI/HI. Escorted pt to the ED exit.

## 2019-06-11 NOTE — ED Notes (Signed)
Unable to contact RN to put telepsych machine in room, will attempt again.

## 2019-06-11 NOTE — BH Assessment (Signed)
Tele Assessment Note   Patient Name: [name] [name] MRN: [id number] Referring Physician: Kennith Maes, PA Location of Patient: [facility] Location of Provider: [place] Department  [name] [name] is an 43 y.o. female.  -Clinician reviewed note by Kennith Maes, PA.  [name] [name] is a 43 y.o. female with a hx of depression, schizoaffective disorder, & arthritis who presents to the ED w/ complaints of depression & SI.  She reports her home situation has led her to feel depressed and she has had thoughts of self-harm, no specific plan or attempts.  Other than her homelessness no alleviating or elevating factors.  Patient was resting when clinician spoke with her.  She was slightly anxious and had a flat affect.  Patient had good eye contact and was able to answer questions directly and coherently.  Patient said she was [facility] from the detention center.  She called her care coordinator with [facility]) but was unable to get her initially.  The care coordinator was to pick her up but was out of town.  She was taken to the [facility] where there was no room.  Patient was able to get in touch with care coordinator who then told her to go to [facility] crisis center.  They turned her away saying that they were not a shelter.  Patient was taken back to [facility] and at that point was overwhelmed and told the police she was suicidal.  She then was brought to [facility].  Patient says that she does not really feel suicidal at this time.  She said that she was overwhelmed about where she was going to go and felt like dying.  Patient now has no plan to kill herself and no intention.  She denies any HI or A/V hallucinations.  Patient denies any use of ETOH or other drugs.  Patient has outpatient services through [facility] coordinator.  She said that she is supposed to be set up with an ACTT team but she is not sure which.  Patient said she is  fine with following up with care coordinator today.    -Clinician discussed patient care with Anette Riedel, NP who recommended patient follow up w/ care coordinator since she did not meet inpatient care criteria.  Clinician informed Kennith Maes of disposition. She said that patient will be discharged soon.   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 Arthritis   . Depression     History reviewed. No pertinent surgical history.  Family History: History reviewed. No pertinent family history.  Social History:  reports that she has never smoked. She has never used smokeless tobacco. She reports previous alcohol use. She reports previous drug use.  Additional Social History:  Alcohol / Drug Use Prescriptions: Zoloft & Zyprexa Over the Counter: None History of alcohol / drug use?: No history of alcohol / drug abuse  CIWA: CIWA-Ar BP: 116/67 Pulse Rate: 99 COWS:    Allergies: No Known Allergies  Home Medications: (Not in a hospital admission)   OB/GYN Status:  No LMP recorded.  General Assessment Data Assessment unable to be completed: Yes Reason for not completing assessment: (unable to reach staff for telepsych) [facility] of Assessment: [facility] TTS Assessment: In system Is this a Tele or Face-to-Face Assessment?: Tele Assessment Is this an Initial Assessment or a Re-assessment for this encounter?: Initial Assessment Patient Accompanied by:: N/A Language Other than English: No Living Arrangements: Homeless/Shelter  What gender do you identify as?: Female Marital status: Single Pregnancy Status: No Living Arrangements: Other (Comment)(Pt currently homeless.) Can pt return to current living arrangement?: Yes Admission Status: Voluntary Is patient capable of signing voluntary admission?: Yes Referral Source: Self/Family/Friend Insurance type: MCR/MCD     Crisis Care Plan Living Arrangements: Other  (Comment)(Pt currently homeless.) Name of Psychiatrist: ACTT team (unsure of whom) Name of Therapist: ACTT team (unsure of whom)  Education Status Is patient currently in school?: No Is the patient employed, unemployed or receiving disability?: Receiving disability income  Risk to self with the past 6 months Suicidal Ideation: No Has patient been a risk to self within the past 6 months prior to admission? : No Suicidal Intent: No Has patient had any suicidal intent within the past 6 months prior to admission? : No Is patient at risk for suicide?: No Suicidal Plan?: No Has patient had any suicidal plan within the past 6 months prior to admission? : No Access to Means: No What has been your use of drugs/alcohol within the last 12 months?: Denies Previous Attempts/Gestures: Yes How many times?: 1 Other Self Harm Risks: None Triggers for Past Attempts: None known Intentional Self Injurious Behavior: None Family Suicide History: No Recent stressful life event(s): Turmoil (Comment)(D/C from detention w/ no place to go) Persecutory voices/beliefs?: No Depression: Yes Depression Symptoms: Despondent Substance abuse history and/or treatment for substance abuse?: No Suicide prevention information given to non-admitted patients: Not applicable  Risk to Others within the past 6 months Homicidal Ideation: No Does patient have any lifetime risk of violence toward others beyond the six months prior to admission? : No Thoughts of Harm to Others: No Current Homicidal Intent: No Current Homicidal Plan: No Access to Homicidal Means: No Identified Victim: No one History of harm to others?: No Assessment of Violence: None Noted Violent Behavior Description: None reported Does patient have access to weapons?: No Criminal Charges Pending?: No Does patient have a court date: No Is patient on probation?: No  Psychosis Hallucinations: None noted Delusions: None noted  Mental Status  Report Appearance/Hygiene: Disheveled, In scrubs Eye Contact: Good Motor Activity: Freedom of movement, Unremarkable Speech: Logical/coherent Level of Consciousness: Alert Mood: Anxious, Sad Affect: Anxious, Sad Anxiety Level: Moderate Thought Processes: Coherent, Relevant Judgement: Unimpaired Orientation: Person, Place, Situation, Time Obsessive Compulsive Thoughts/Behaviors: None  Cognitive Functioning Concentration: Normal Memory: Remote Intact, Recent Intact Is patient IDD: No Insight: Good Impulse Control: Good Appetite: Good Have you had any weight changes? : No Change Sleep: No Change Total Hours of Sleep: 8 Vegetative Symptoms: None  ADLScreening [facility]) Patient's cognitive ability adequate to safely complete daily activities?: Yes Patient able to express need for assistance with ADLs?: Yes Independently performs ADLs?: Yes (appropriate for developmental age)  Prior Inpatient Therapy Prior Inpatient Therapy: Yes Prior Therapy Dates: 08/2018, 06/2018 [facility] Reason for Treatment: SI  Prior Outpatient Therapy Prior Outpatient Therapy: Yes Prior Therapy Dates: Current Prior Therapy Facilty/Provider(s): an ACTT team has been set up for her Reason for Treatment: ACTT team services Does patient have an ACCT team?: Unknown Does patient have Intensive In-House Services?  : No Does patient have [facility] services? : No Does patient have P4CC services?: No  ADL Screening (condition at time of admission) Patient's cognitive ability adequate to safely complete daily activities?: Yes Is the patient deaf or have difficulty hearing?: No Does the patient have difficulty seeing, even when wearing glasses/contacts?: No Does the patient have difficulty concentrating, remembering, or  making decisions?: No Patient able to express need for assistance with ADLs?: Yes Does the patient have difficulty dressing or bathing?:  No Independently performs ADLs?: Yes (appropriate for developmental age) Does the patient have difficulty walking or climbing stairs?: No Weakness of Legs: None Weakness of Arms/Hands: None       Abuse/Neglect Assessment (Assessment to be complete while patient is alone) Abuse/Neglect Assessment Can Be Completed: Yes Physical Abuse: Denies Verbal Abuse: Yes, past (Comment) Sexual Abuse: Denies Exploitation of patient/patient's resources: Denies Self-Neglect: Denies     Merchant navy officerAdvance Directives (For Healthcare) Does Patient Have a Medical Advance Directive?: No Would patient like information on creating a medical advance directive?: No - Patient declined          Disposition:  Disposition Initial Assessment Completed for this Encounter: Yes Patient referred to: (To be reviewed with NP)  This service was provided via telemedicine using a 2-way, interactive audio and Immunologistvideo technology.  Names of all persons participating in this telemedicine service and their role in this encounter. Name: [name] DittyBillie [name] Role: patient  Name: Beatriz StallionMarcus Orvis Stann, M.S. LCAS QP Role: clinician  Name:  Role:   Name:  Role:     Alexandria LodgeHarvey, Rosario Duey Ray 06/11/2019 4:47 AM

## 2019-06-25 DIAGNOSIS — R1084 Generalized abdominal pain: Secondary | ICD-10-CM | POA: Diagnosis not present

## 2019-06-25 DIAGNOSIS — F200 Paranoid schizophrenia: Secondary | ICD-10-CM | POA: Diagnosis not present

## 2019-06-25 DIAGNOSIS — R456 Violent behavior: Secondary | ICD-10-CM | POA: Diagnosis not present

## 2019-06-25 DIAGNOSIS — Z781 Physical restraint status: Secondary | ICD-10-CM | POA: Diagnosis not present

## 2019-06-25 DIAGNOSIS — Z9119 Patient's noncompliance with other medical treatment and regimen: Secondary | ICD-10-CM | POA: Diagnosis not present

## 2019-06-25 DIAGNOSIS — R110 Nausea: Secondary | ICD-10-CM | POA: Diagnosis not present

## 2019-06-25 DIAGNOSIS — Z20828 Contact with and (suspected) exposure to other viral communicable diseases: Secondary | ICD-10-CM | POA: Diagnosis not present

## 2019-06-25 DIAGNOSIS — Z590 Homelessness: Secondary | ICD-10-CM | POA: Diagnosis not present

## 2019-06-25 DIAGNOSIS — Z8659 Personal history of other mental and behavioral disorders: Secondary | ICD-10-CM | POA: Diagnosis not present

## 2019-06-25 DIAGNOSIS — Z3202 Encounter for pregnancy test, result negative: Secondary | ICD-10-CM | POA: Diagnosis not present

## 2019-06-25 DIAGNOSIS — F69 Unspecified disorder of adult personality and behavior: Secondary | ICD-10-CM | POA: Diagnosis not present

## 2019-06-25 DIAGNOSIS — R4182 Altered mental status, unspecified: Secondary | ICD-10-CM | POA: Diagnosis not present

## 2019-06-26 DIAGNOSIS — F200 Paranoid schizophrenia: Secondary | ICD-10-CM | POA: Diagnosis present

## 2019-06-26 DIAGNOSIS — Z590 Homelessness: Secondary | ICD-10-CM | POA: Diagnosis not present

## 2019-06-26 DIAGNOSIS — R456 Violent behavior: Secondary | ICD-10-CM | POA: Diagnosis present

## 2019-06-26 DIAGNOSIS — Z9119 Patient's noncompliance with other medical treatment and regimen: Secondary | ICD-10-CM | POA: Diagnosis not present

## 2019-06-26 DIAGNOSIS — Z20828 Contact with and (suspected) exposure to other viral communicable diseases: Secondary | ICD-10-CM | POA: Diagnosis present

## 2019-06-26 DIAGNOSIS — R4182 Altered mental status, unspecified: Secondary | ICD-10-CM | POA: Diagnosis not present

## 2019-06-26 DIAGNOSIS — F209 Schizophrenia, unspecified: Secondary | ICD-10-CM | POA: Diagnosis not present

## 2019-06-26 DIAGNOSIS — Z781 Physical restraint status: Secondary | ICD-10-CM | POA: Diagnosis not present

## 2019-07-01 DIAGNOSIS — F200 Paranoid schizophrenia: Secondary | ICD-10-CM | POA: Insufficient documentation

## 2019-07-04 DIAGNOSIS — F251 Schizoaffective disorder, depressive type: Secondary | ICD-10-CM | POA: Diagnosis not present

## 2019-07-04 DIAGNOSIS — Z590 Homelessness: Secondary | ICD-10-CM | POA: Diagnosis not present

## 2019-07-04 DIAGNOSIS — F4310 Post-traumatic stress disorder, unspecified: Secondary | ICD-10-CM | POA: Diagnosis not present

## 2019-07-05 ENCOUNTER — Other Ambulatory Visit: Payer: Self-pay

## 2019-07-05 ENCOUNTER — Emergency Department (HOSPITAL_COMMUNITY)
Admission: EM | Admit: 2019-07-05 | Discharge: 2019-07-06 | Disposition: A | Discharge status: Home / Self Care | Payer: Medicare Other | Attending: Emergency Medicine | Admitting: Emergency Medicine

## 2019-07-05 ENCOUNTER — Encounter (HOSPITAL_COMMUNITY): Payer: Self-pay | Admitting: Emergency Medicine

## 2019-07-05 DIAGNOSIS — Z5900 Homelessness unspecified: Secondary | ICD-10-CM

## 2019-07-05 DIAGNOSIS — Z20828 Contact with and (suspected) exposure to other viral communicable diseases: Secondary | ICD-10-CM | POA: Insufficient documentation

## 2019-07-05 DIAGNOSIS — F250 Schizoaffective disorder, bipolar type: Secondary | ICD-10-CM | POA: Diagnosis present

## 2019-07-05 DIAGNOSIS — F209 Schizophrenia, unspecified: Secondary | ICD-10-CM | POA: Diagnosis not present

## 2019-07-05 DIAGNOSIS — F4329 Adjustment disorder with other symptoms: Secondary | ICD-10-CM | POA: Diagnosis not present

## 2019-07-05 DIAGNOSIS — R410 Disorientation, unspecified: Secondary | ICD-10-CM | POA: Diagnosis present

## 2019-07-05 DIAGNOSIS — Z03818 Encounter for observation for suspected exposure to other biological agents ruled out: Secondary | ICD-10-CM | POA: Diagnosis not present

## 2019-07-05 LAB — CBC
HCT: 33.8 % — ABNORMAL LOW (ref 36.0–46.0)
Hemoglobin: 10.6 g/dL — ABNORMAL LOW (ref 12.0–15.0)
MCH: 29 pg (ref 26.0–34.0)
MCHC: 31.4 g/dL (ref 30.0–36.0)
MCV: 92.3 fL (ref 80.0–100.0)
Platelets: 302 10*3/uL (ref 150–400)
RBC: 3.66 MIL/uL — ABNORMAL LOW (ref 3.87–5.11)
RDW: 13.4 % (ref 11.5–15.5)
WBC: 5.8 10*3/uL (ref 4.0–10.5)
nRBC: 0 % (ref 0.0–0.2)

## 2019-07-05 LAB — RAPID URINE DRUG SCREEN, HOSP PERFORMED
Amphetamines: NOT DETECTED
Barbiturates: NOT DETECTED
Benzodiazepines: NOT DETECTED
Cocaine: NOT DETECTED
Opiates: NOT DETECTED
Tetrahydrocannabinol: NOT DETECTED

## 2019-07-05 LAB — I-STAT BETA HCG BLOOD, ED (MC, WL, AP ONLY): I-stat hCG, quantitative: <5 m[IU]/mL (ref <5)

## 2019-07-05 LAB — COMPREHENSIVE METABOLIC PANEL
ALT: 16 U/L (ref 0–44)
AST: 22 U/L (ref 15–41)
Albumin: 4.1 g/dL (ref 3.5–5.0)
Alkaline Phosphatase: 48 U/L (ref 38–126)
Anion gap: 11 (ref 5–15)
BUN: 12 mg/dL (ref 6–20)
CO2: 24 mmol/L (ref 22–32)
Calcium: 9.2 mg/dL (ref 8.9–10.3)
Chloride: 104 mmol/L (ref 98–111)
Creatinine, Ser: 0.49 mg/dL (ref 0.44–1.00)
GFR calc Af Amer: >60 mL/min (ref >60)
GFR calc non Af Amer: >60 mL/min (ref >60)
Glucose, Bld: 97 mg/dL (ref 70–99)
Potassium: 3.1 mmol/L — ABNORMAL LOW (ref 3.5–5.1)
Sodium: 139 mmol/L (ref 135–145)
Total Bilirubin: 0.5 mg/dL (ref 0.3–1.2)
Total Protein: 7.2 g/dL (ref 6.5–8.1)

## 2019-07-05 LAB — SALICYLATE LEVEL: Salicylate Lvl: <7 mg/dL (ref 2.8–30.0)

## 2019-07-05 LAB — ETHANOL: Alcohol, Ethyl (B): <10 mg/dL (ref <10)

## 2019-07-05 LAB — ACETAMINOPHEN LEVEL: Acetaminophen (Tylenol), Serum: <10 ug/mL — ABNORMAL LOW (ref 10–30)

## 2019-07-05 MED ORDER — GUAIFENESIN-DM 100-10 MG/5ML PO SYRP
10.00 | ORAL_SOLUTION | ORAL | Status: DC
Start: ? — End: 2019-07-05

## 2019-07-05 MED ORDER — LORAZEPAM 2 MG/ML IJ SOLN
2.00 | INTRAMUSCULAR | Status: DC
Start: ? — End: 2019-07-05

## 2019-07-05 MED ORDER — SALINE NASAL SPRAY 0.65 % NA SOLN
1.00 | NASAL | Status: DC
Start: ? — End: 2019-07-05

## 2019-07-05 MED ORDER — HYDROXYZINE HCL 25 MG PO TABS
50.00 | ORAL_TABLET | ORAL | Status: DC
Start: ? — End: 2019-07-05

## 2019-07-05 MED ORDER — HALOPERIDOL 5 MG PO TABS
5.00 | ORAL_TABLET | ORAL | Status: DC
Start: ? — End: 2019-07-05

## 2019-07-05 MED ORDER — IBUPROFEN 400 MG PO TABS
400.00 | ORAL_TABLET | ORAL | Status: DC
Start: ? — End: 2019-07-05

## 2019-07-05 MED ORDER — PALIPERIDONE PALMITATE ER 156 MG/ML IM SUSY
156.00 | PREFILLED_SYRINGE | INTRAMUSCULAR | Status: DC
Start: ? — End: 2019-07-05

## 2019-07-05 MED ORDER — TRAZODONE HCL 100 MG PO TABS
100.00 | ORAL_TABLET | ORAL | Status: DC
Start: ? — End: 2019-07-05

## 2019-07-05 MED ORDER — TRAZODONE HCL 50 MG PO TABS
50.00 | ORAL_TABLET | ORAL | Status: DC
Start: ? — End: 2019-07-05

## 2019-07-05 MED ORDER — CLONIDINE HCL 0.1 MG PO TABS
0.10 | ORAL_TABLET | ORAL | Status: DC
Start: ? — End: 2019-07-05

## 2019-07-05 MED ORDER — GENERIC EXTERNAL MEDICATION
5.00 | Status: DC
Start: ? — End: 2019-07-05

## 2019-07-05 MED ORDER — POTASSIUM CHLORIDE CRYS ER 20 MEQ PO TBCR
40.0000 meq | EXTENDED_RELEASE_TABLET | Freq: Once | ORAL | Status: AC
  Administered 2019-07-06: 06:00:00 40 meq via ORAL
  Filled 2019-07-05: qty 2

## 2019-07-05 MED ORDER — LORAZEPAM 1 MG PO TABS
1.00 | ORAL_TABLET | ORAL | Status: DC
Start: ? — End: 2019-07-05

## 2019-07-05 MED ORDER — METHOCARBAMOL 750 MG PO TABS
1500.00 | ORAL_TABLET | ORAL | Status: DC
Start: ? — End: 2019-07-05

## 2019-07-05 MED ORDER — ONDANSETRON 8 MG PO TBDP
8.00 | ORAL_TABLET | ORAL | Status: DC
Start: ? — End: 2019-07-05

## 2019-07-05 MED ORDER — PROMETHAZINE HCL 25 MG PO TABS
25.00 | ORAL_TABLET | ORAL | Status: DC
Start: ? — End: 2019-07-05

## 2019-07-05 MED ORDER — ALUMINUM-MAGNESIUM-SIMETHICONE 200-200-20 MG/5ML PO SUSP
30.00 | ORAL | Status: DC
Start: ? — End: 2019-07-05

## 2019-07-05 MED ORDER — CETIRIZINE HCL 10 MG PO TABS
10.00 | ORAL_TABLET | ORAL | Status: DC
Start: ? — End: 2019-07-05

## 2019-07-05 MED ORDER — LOPERAMIDE HCL 2 MG PO CAPS
2.00 | ORAL_CAPSULE | ORAL | Status: DC
Start: ? — End: 2019-07-05

## 2019-07-05 MED ORDER — DIPHENHYDRAMINE HCL 50 MG/ML IJ SOLN
50.00 | INTRAMUSCULAR | Status: DC
Start: ? — End: 2019-07-05

## 2019-07-05 MED ORDER — ACETAMINOPHEN 325 MG PO TABS
650.00 | ORAL_TABLET | ORAL | Status: DC
Start: ? — End: 2019-07-05

## 2019-07-05 MED ORDER — BENZOCAINE-MENTHOL 6-10 MG MT LOZG
1.00 | LOZENGE | OROMUCOSAL | Status: DC
Start: ? — End: 2019-07-05

## 2019-07-05 MED ORDER — DIPHENHYDRAMINE HCL 25 MG PO CAPS
50.00 | ORAL_CAPSULE | ORAL | Status: DC
Start: ? — End: 2019-07-05

## 2019-07-05 MED ORDER — QUINTABS PO TABS
1.00 | ORAL_TABLET | ORAL | Status: DC
Start: 2019-07-04 — End: 2019-07-05

## 2019-07-05 MED ORDER — BISACODYL 5 MG PO TBEC
10.00 | DELAYED_RELEASE_TABLET | ORAL | Status: DC
Start: ? — End: 2019-07-05

## 2019-07-05 NOTE — ED Provider Notes (Signed)
[facility]   CSN: [id number] Arrival date & time: 07/05/19  1909     History   Chief Complaint Chief Complaint  Patient presents with  . Feels Dirty    HPI [name] [name] is a 43 y.o. female.     Patient with history of schizophrenia, recent admission [facility] --presents the emergency department feeling confused and sad.  She told triage nurse that she "felt dirty".  She has recently been staying at a homeless shelter.  Patient is confused and disorganized.  Level 5 caveat due to psychiatric illness.  She has mumbling speech and is hard to understand.     Past Medical History:  Diagnosis Date  . Arthritis   . Depression     Patient Active Problem List   Diagnosis Date Noted  . Hallucinations   . Schizoaffective disorder, bipolar type (HCC) 09/01/2018    History reviewed. No pertinent surgical history.   OB History   No obstetric history on file.      Home Medications    Prior to Admission medications   Medication Sig Start Date End Date Taking? Authorizing Provider  paliperidone (INVEGA SUSTENNA) 156 MG/ML SUSY injection Inject 156 mg into the muscle once. 07/31/19  Yes [provider]  OLANZapine (ZYPREXA) 10 MG tablet Take 1 tablet (10 mg total) by mouth at bedtime. Patient not taking: Reported on 07/05/2019 09/06/18   Micheal Likensainville, Christopher T, MD  potassium chloride SA (K-DUR) 20 MEQ tablet Take 1 tablet (20 mEq total) by mouth daily. Patient not taking: Reported on 07/05/2019 06/11/19   Petrucelli, Pleas KochSamantha R, PA-C    Family History No family history on file.  Social History Social History   Tobacco Use  . Smoking status: Never Smoker  . Smokeless tobacco: Never Used  Substance Use Topics  . Alcohol use: Not Currently  . Drug use: Not Currently     Allergies   Patient has no known allergies.   Review of Systems Review of Systems  Unable to perform ROS: Psychiatric disorder     Physical Exam Updated Vital Signs BP 115/75 (BP Location: Left Arm)   Pulse 95   Temp 98.7 F (37.1 C) (Oral)   Resp 15   Ht 4\' 10"  (1.473 m)   Wt 49 kg   SpO2 100%   BMI 22.58 kg/m   Physical Exam Vitals signs and nursing note reviewed.  Constitutional:      Appearance: She is well-developed.  HENT:     Head: Normocephalic and atraumatic.  Eyes:     General:        Right eye: No discharge.        Left eye: No discharge.     Conjunctiva/sclera: Conjunctivae normal.  Neck:     Musculoskeletal: Normal range of motion and neck supple.  Cardiovascular:     Rate and Rhythm: Normal rate and regular rhythm.     Heart sounds: Normal heart sounds.  Pulmonary:     Effort: Pulmonary effort is normal.     Breath sounds: Normal breath sounds.  Abdominal:     Palpations: Abdomen is soft.     Tenderness: There is no abdominal tenderness.  Skin:    General: Skin is warm and dry.  Neurological:     Mental Status: She is alert.  Psychiatric:        Mood and Affect: Affect is blunt and flat.        Behavior: Behavior is  slowed.        Thought Content: Thought content is delusional. Thought content does not include homicidal or suicidal ideation.      ED Treatments / Results  Labs (all labs ordered are listed, but only abnormal results are displayed) Labs Reviewed  COMPREHENSIVE METABOLIC PANEL - Abnormal; Notable for the following components:      Result Value   Potassium 3.1 (*)    All other components within normal limits  ACETAMINOPHEN LEVEL - Abnormal; Notable for the following components:   Acetaminophen (Tylenol), Serum <10 (*)    All other components within normal limits  CBC - Abnormal; Notable for the following components:   RBC 3.66 (*)    Hemoglobin 10.6 (*)    HCT 33.8 (*)    All other components within normal limits  SARS CORONAVIRUS 2 (TAT 6-12 HRS)  ETHANOL  SALICYLATE LEVEL  RAPID URINE DRUG SCREEN, HOSP PERFORMED  I-STAT BETA HCG BLOOD, ED ([facility], [facility],  [facility] ONLY)    EKG None  Radiology No results found.  Procedures Procedures (including critical care time)  Medications Ordered in ED Medications  potassium chloride SA (K-DUR) CR tablet 40 mEq (has no administration in time range)     Initial Impression / Assessment and Plan / ED Course  I have reviewed the triage vital signs and the nursing notes.  Pertinent labs & imaging results that were available during my care of the patient were reviewed by me and considered in my medical decision making (see chart for details).        Patient seen and examined.  Very tough to take history from patient.  Reviewed recent records from [facility].  TTS consult ordered.  Vital signs reviewed and are as follows: BP 115/75 (BP Location: Left Arm)   Pulse 95   Temp 98.7 F (37.1 C) (Oral)   Resp 15   Ht 4\' 10"  (1.473 m)   Wt 49 kg   SpO2 100%   BMI 22.58 kg/m   Patient is medically cleared.  Potassium repletion ordered.  TTS evaluation completed.  Patient to be reevaluated in the a.m.  Final Clinical Impressions(s) / ED Diagnoses   Final diagnoses:  Schizophrenia, unspecified type ([place])   Reassess in AM.   ED Discharge Orders    None       Carlisle Cater, PA-C 07/05/19 2303    Margette Fast, MD 07/06/19 [phone]
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
## 2019-07-06 ENCOUNTER — Encounter (HOSPITAL_COMMUNITY): Payer: Self-pay | Admitting: Registered Nurse

## 2019-07-06 DIAGNOSIS — F4329 Adjustment disorder with other symptoms: Secondary | ICD-10-CM

## 2019-07-06 DIAGNOSIS — Z5900 Homelessness unspecified: Secondary | ICD-10-CM

## 2019-07-06 DIAGNOSIS — F250 Schizoaffective disorder, bipolar type: Secondary | ICD-10-CM | POA: Diagnosis not present

## 2019-07-06 LAB — SARS CORONAVIRUS 2 (TAT 6-24 HRS): SARS Coronavirus 2: NEGATIVE

## 2019-07-06 MED ORDER — ONDANSETRON 4 MG PO TBDP
4.0000 mg | ORAL_TABLET | Freq: Once | ORAL | Status: AC
  Administered 2019-07-06: 4 mg via ORAL
  Filled 2019-07-06: qty 1
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
## 2019-07-06 NOTE — Progress Notes (Signed)
CSW faxed outpatient and community resources for patient discharge.   Chalmers Guest. Guerry Bruin, MSW, [place] Work/Disposition Phone: [phone] Fax: [phone]

## 2019-07-06 NOTE — ED Notes (Signed)
Pt. Currently talking to TTS.

## 2019-07-06 NOTE — ED Notes (Signed)
Per [place], [facility] NP, pt has been psych cleared and will need to follow up w/[facility]. Will enter note shortly.

## 2019-07-06 NOTE — ED Triage Notes (Signed)
PT sitting up in bed talking with TTS staff, Plan to give zofran for nausea when done

## 2019-07-06 NOTE — ED Notes (Signed)
Ordered bfast 
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
## 2019-07-06 NOTE — ED Notes (Signed)
Patient verbalizes understanding of discharge instructions . Opportunity for questions and answers were provided . Armband removed by staff ,Pt discharged from ED. W/C  offered at D/C  and Declined W/C at D/C and was escorted to lobby by RN.  

## 2019-07-06 NOTE — ED Provider Notes (Signed)
Emergency Medicine Observation Re-evaluation Note  [name] is a 43 y.o. female, seen on rounds today.  Pt initially presented to the ED for complaints of Feels Dirty Currently, the patient is resting comfortably on bed.  She states she has a little bit of nausea.  He did eat breakfast without any difficulty.  Physical Exam  BP 101/62   Pulse 97   Temp 98.7 F (37.1 C) (Oral)   Resp 14   Ht 4\' 10"  (1.473 m)   Wt 49 kg   SpO2 99%   BMI 22.58 kg/m  Physical Exam   Regular rate and rhythm. Soft, nondistended.  No tenderness noted.  ED Course / MDM  EKG:    I have reviewed the labs performed to date as well as medications administered while in observation.  Recent changes in the last 24 hours include   Results for orders placed or performed during the hospital encounter of 07/05/19 (from the past 24 hour(s))  Rapid urine drug screen (hospital performed)     Status: None   Collection Time: 07/05/19  8:18 PM  Result Value Ref Range   Opiates NONE DETECTED NONE DETECTED   Cocaine NONE DETECTED NONE DETECTED   Benzodiazepines NONE DETECTED NONE DETECTED   Amphetamines NONE DETECTED NONE DETECTED   Tetrahydrocannabinol NONE DETECTED NONE DETECTED   Barbiturates NONE DETECTED NONE DETECTED  Comprehensive metabolic panel     Status: Abnormal   Collection Time: 07/05/19  8:21 PM  Result Value Ref Range   Sodium 139 135 - 145 mmol/L   Potassium 3.1 (L) 3.5 - 5.1 mmol/L   Chloride 104 98 - 111 mmol/L   CO2 24 22 - 32 mmol/L   Glucose, Bld 97 70 - 99 mg/dL   BUN 12 6 - 20 mg/dL   Creatinine, Ser 0.49 0.44 - 1.00 mg/dL   Calcium 9.2 8.9 - 10.3 mg/dL   Total Protein 7.2 6.5 - 8.1 g/dL   Albumin 4.1 3.5 - 5.0 g/dL   AST 22 15 - 41 U/L   ALT 16 0 - 44 U/L   Alkaline Phosphatase 48 38 - 126 U/L   Total Bilirubin 0.5 0.3 - 1.2 mg/dL   GFR calc non Af Amer >60 >60 mL/min   GFR calc Af Amer >60 >60 mL/min   Anion gap 11 5 - 15  Ethanol     Status: None   Collection Time:  07/05/19  8:21 PM  Result Value Ref Range   Alcohol, Ethyl (B) <25 <42 mg/dL  Salicylate level     Status: None   Collection Time: 07/05/19  8:21 PM  Result Value Ref Range   Salicylate Lvl <7.0 2.8 - 30.0 mg/dL  Acetaminophen level     Status: Abnormal   Collection Time: 07/05/19  8:21 PM  Result Value Ref Range   Acetaminophen (Tylenol), Serum <10 (L) 10 - 30 ug/mL  cbc     Status: Abnormal   Collection Time: 07/05/19  8:21 PM  Result Value Ref Range   WBC 5.8 4.0 - 10.5 K/uL   RBC 3.66 (L) 3.87 - 5.11 MIL/uL   Hemoglobin 10.6 (L) 12.0 - 15.0 g/dL   HCT 33.8 (L) 36.0 - 46.0 %   MCV 92.3 80.0 - 100.0 fL   MCH 29.0 26.0 - 34.0 pg   MCHC 31.4 30.0 - 36.0 g/dL   RDW 13.4 11.5 - 15.5 %   Platelets 302 150 - 400 K/uL   nRBC 0.0 0.0 - 0.2 %  I-Stat beta hCG blood, ED     Status: None   Collection Time: 07/05/19  8:27 PM  Result Value Ref Range   I-stat hCG, quantitative <5.0 <5 mIU/mL   Comment 3          SARS CORONAVIRUS 2 (TAT 6-12 HRS) Nasal Swab Aptima Multi Swab     Status: None   Collection Time: 07/05/19 11:12 PM   Specimen: Aptima Multi Swab; Nasal Swab  Result Value Ref Range   SARS Coronavirus 2 NEGATIVE NEGATIVE     Plan   Patient seen by behavioral health earlier this morning.  She is pending evaluation and decision.  3:01 PM: [facility] patient and is recommending discharge. They have provided outpatient resources.   Reevaluation of patient.  She is resting comfortably in bed.  She ate lunch without any difficulty.  She denies any SI, HI.  She is alert and oriented x3 and is able to answer all my questions appropriately.  Discussed with her regarding follow-up options and community resources.  Instructed her to follow-up as directed.  Patient has been medically cleared. At this time, patient exhibits no emergent life-threatening condition that require further evaluation in ED or admission. Patient had ample opportunity for questions and discussion. All  patient's questions were answered with full understanding. Strict return precautions discussed. Patient expresses understanding and agreement to plan.   1. Schizophrenia, unspecified type [facility](HCC)     Portions of this note were generated with Dragon dictation software. Dictation errors may occur despite best attempts at proofreading.       Maxwell CaulLayden, Lindsey A, PA-C 07/06/19 1934    Tegeler, Canary Brimhristopher J, MD 07/06/19 2002

## 2019-07-06 NOTE — Discharge Instructions (Signed)
As we discussed, it is very important for you to follow-up with outpatient resources given.  Specifically, please follow-up with [facility] outpatient resource center.  Their information is provided in the paperwork.  Return the emergency department for any thoughts of wanting to hurt or kill yourself, thoughts of wanting to hurt conically else, any worrisome or concerning symptoms.

## 2019-07-14 ENCOUNTER — Other Ambulatory Visit: Payer: Self-pay

## 2019-07-14 ENCOUNTER — Emergency Department (HOSPITAL_COMMUNITY)
Admission: EM | Admit: 2019-07-14 | Discharge: 2019-07-15 | Disposition: A | Discharge status: Home / Self Care | Payer: Medicare Other | Attending: Emergency Medicine | Admitting: Emergency Medicine

## 2019-07-14 ENCOUNTER — Encounter (HOSPITAL_COMMUNITY): Payer: Self-pay | Admitting: Emergency Medicine

## 2019-07-14 DIAGNOSIS — F209 Schizophrenia, unspecified: Secondary | ICD-10-CM | POA: Insufficient documentation

## 2019-07-14 DIAGNOSIS — R45851 Suicidal ideations: Secondary | ICD-10-CM | POA: Insufficient documentation

## 2019-07-14 DIAGNOSIS — Z590 Homelessness: Secondary | ICD-10-CM | POA: Diagnosis not present

## 2019-07-14 DIAGNOSIS — R440 Auditory hallucinations: Secondary | ICD-10-CM | POA: Diagnosis not present

## 2019-07-14 DIAGNOSIS — Z915 Personal history of self-harm: Secondary | ICD-10-CM | POA: Diagnosis not present

## 2019-07-14 DIAGNOSIS — F250 Schizoaffective disorder, bipolar type: Secondary | ICD-10-CM | POA: Diagnosis present

## 2019-07-14 LAB — CBC WITH DIFFERENTIAL/PLATELET
Abs Immature Granulocytes: 0.02 10*3/uL (ref 0.00–0.07)
Basophils Absolute: 0 10*3/uL (ref 0.0–0.1)
Basophils Relative: 0 %
Eosinophils Absolute: 0.1 10*3/uL (ref 0.0–0.5)
Eosinophils Relative: 2 %
HCT: 37.2 % (ref 36.0–46.0)
Hemoglobin: 11.7 g/dL — ABNORMAL LOW (ref 12.0–15.0)
Immature Granulocytes: 0 %
Lymphocytes Relative: 41 %
Lymphs Abs: 2.1 10*3/uL (ref 0.7–4.0)
MCH: 28.4 pg (ref 26.0–34.0)
MCHC: 31.5 g/dL (ref 30.0–36.0)
MCV: 90.3 fL (ref 80.0–100.0)
Monocytes Absolute: 0.6 10*3/uL (ref 0.1–1.0)
Monocytes Relative: 11 %
Neutro Abs: 2.3 10*3/uL (ref 1.7–7.7)
Neutrophils Relative %: 46 %
Platelets: 384 10*3/uL (ref 150–400)
RBC: 4.12 MIL/uL (ref 3.87–5.11)
RDW: 13.6 % (ref 11.5–15.5)
WBC: 5.1 10*3/uL (ref 4.0–10.5)
nRBC: 0 % (ref 0.0–0.2)

## 2019-07-14 LAB — BASIC METABOLIC PANEL
Anion gap: 8 (ref 5–15)
BUN: 12 mg/dL (ref 6–20)
CO2: 24 mmol/L (ref 22–32)
Calcium: 9.2 mg/dL (ref 8.9–10.3)
Chloride: 106 mmol/L (ref 98–111)
Creatinine, Ser: 0.54 mg/dL (ref 0.44–1.00)
GFR calc Af Amer: >60 mL/min (ref >60)
GFR calc non Af Amer: >60 mL/min (ref >60)
Glucose, Bld: 90 mg/dL (ref 70–99)
Potassium: 3.6 mmol/L (ref 3.5–5.1)
Sodium: 138 mmol/L (ref 135–145)

## 2019-07-14 LAB — I-STAT BETA HCG BLOOD, ED (MC, WL, AP ONLY): I-stat hCG, quantitative: <5 m[IU]/mL (ref <5)

## 2019-07-14 LAB — ETHANOL: Alcohol, Ethyl (B): <10 mg/dL (ref <10)

## 2019-07-14 NOTE — ED Notes (Signed)
TTS being done at this time via Telepsych machine.  

## 2019-07-14 NOTE — ED Triage Notes (Signed)
Pt presents with [facility] for evaluation of hallucinations of God telling her that he was going to kill her and her having suicidal ideations. Pt reports having plan of slitting wrists.

## 2019-07-14 NOTE — ED Provider Notes (Signed)
[facility]   CSN: [id number] Arrival date & time: 07/14/19  2120     History   Chief Complaint Chief Complaint  Patient presents with  . Suicidal  . Hallucinations    HPI [name] [name] is a 43 y.o. female.     The history is provided by the patient and the police. The history is limited by the condition of the patient (psychiatric disorder).    Pt was seen at 2125. Per pt and Police: Pt brought to the ED by Police after calling 911 for "hallucinations of God telling her that he was going to kill her." Endorses SI with plan to "slit her wrists." Pt states "I keep telling people something is wrong with me but no one is helping me." Denies HI, no SA.   Past Medical History:  Diagnosis Date  . Arthritis   . Depression     Patient Active Problem List   Diagnosis Date Noted  . Homelessness 07/06/2019  . Adjustment disorder with disturbance of emotion 07/06/2019  . Hallucinations   . Schizoaffective disorder, bipolar type (HCC) 09/01/2018    History reviewed. No pertinent surgical history.   OB History   No obstetric history on file.      Home Medications    Prior to Admission medications   Medication Sig Start Date End Date Taking? Authorizing Provider  paliperidone (INVEGA SUSTENNA) 156 MG/ML SUSY injection Inject 156 mg into the muscle once. 07/31/19  Yes [provider]  OLANZapine (ZYPREXA) 10 MG tablet Take 1 tablet (10 mg total) by mouth at bedtime. Patient not taking: Reported on 07/05/2019 09/06/18   Micheal Likensainville, Christopher T, MD  potassium chloride SA (K-DUR) 20 MEQ tablet Take 1 tablet (20 mEq total) by mouth daily. Patient not taking: Reported on 07/05/2019 06/11/19   Petrucelli, Pleas KochSamantha R, PA-C    Family History History reviewed. No pertinent family history.  Social History Social History   Tobacco Use  . Smoking status: Never Smoker  . Smokeless tobacco: Never Used  Substance Use Topics  .  Alcohol use: Not Currently  . Drug use: Not Currently     Allergies   Patient has no known allergies.   Review of Systems Review of Systems  Unable to perform ROS: Psychiatric disorder     Physical Exam Updated Vital Signs BP 105/71 (BP Location: Left Arm)   Pulse 73   Temp 98.6 F (37 C) (Oral)   Resp 18   Ht 4\' 10"  (1.473 m)   Wt 49.4 kg   LMP 06/23/2019   SpO2 99%   BMI 22.78 kg/m   Physical Exam 2130: Physical examination:  Nursing notes reviewed; Vital signs and O2 SAT reviewed;  Constitutional: Well developed, Well nourished, Well hydrated, In no acute distress; Head:  Normocephalic, atraumatic; Eyes: EOMI, PERRL, No scleral icterus; ENMT: Mouth and pharynx normal, Mucous membranes moist; Neck: Supple, Full range of motion; Cardiovascular: Regular rate and rhythm; Respiratory: Breath sounds clear, No wheezes.  Speaking full sentences with ease, Normal respiratory effort/excursion; Chest: No deformity, Movement normal; Abdomen: Nondistended; Extremities: No deformity.; Neuro: AA&Ox3, Major CN grossly intact.  Speech clear. No gross focal motor deficits in extremities. Climbs on and off stretcher easily by herself. Gait steady.; Skin: Color normal, Warm, Dry.; Psych:  Affect flat, mumbling.    ED Treatments / Results  Labs (all labs ordered are listed, but only abnormal results are displayed)   EKG None  Radiology   Procedures Procedures (including  critical care time)  Medications Ordered in ED Medications - No data to display   Initial Impression / Assessment and Plan / ED Course  I have reviewed the triage vital signs and the nursing notes.  Pertinent labs & imaging results that were available during my care of the patient were reviewed by me and considered in my medical decision making (see chart for details).     MDM Reviewed: previous chart, nursing note and vitals Reviewed previous: labs Interpretation: labs   Results for orders placed or  performed during the hospital encounter of 07/14/19  Ethanol  Result Value Ref Range   Alcohol, Ethyl (B) <10 <10 mg/dL  Basic metabolic panel  Result Value Ref Range   Sodium 138 135 - 145 mmol/L   Potassium 3.6 3.5 - 5.1 mmol/L   Chloride 106 98 - 111 mmol/L   CO2 24 22 - 32 mmol/L   Glucose, Bld 90 70 - 99 mg/dL   BUN 12 6 - 20 mg/dL   Creatinine, Ser 0.54 0.44 - 1.00 mg/dL   Calcium 9.2 8.9 - 10.3 mg/dL   GFR calc non Af Amer >60 >60 mL/min   GFR calc Af Amer >60 >60 mL/min   Anion gap 8 5 - 15  CBC with Differential  Result Value Ref Range   WBC 5.1 4.0 - 10.5 K/uL   RBC 4.12 3.87 - 5.11 MIL/uL   Hemoglobin 11.7 (L) 12.0 - 15.0 g/dL   HCT 37.2 36.0 - 46.0 %   MCV 90.3 80.0 - 100.0 fL   MCH 28.4 26.0 - 34.0 pg   MCHC 31.5 30.0 - 36.0 g/dL   RDW 13.6 11.5 - 15.5 %   Platelets 384 150 - 400 K/uL   nRBC 0.0 0.0 - 0.2 %   Neutrophils Relative % 46 %   Neutro Abs 2.3 1.7 - 7.7 K/uL   Lymphocytes Relative 41 %   Lymphs Abs 2.1 0.7 - 4.0 K/uL   Monocytes Relative 11 %   Monocytes Absolute 0.6 0.1 - 1.0 K/uL   Eosinophils Relative 2 %   Eosinophils Absolute 0.1 0.0 - 0.5 K/uL   Basophils Relative 0 %   Basophils Absolute 0.0 0.0 - 0.1 K/uL   Immature Granulocytes 0 %   Abs Immature Granulocytes 0.02 0.00 - 0.07 K/uL  I-Stat beta hCG blood, ED  Result Value Ref Range   I-stat hCG, quantitative <5.0 <5 mIU/mL   Comment 3             2255:  TTS to evaluate.   2330:  TTS has evaluated pt: Pt to stay in ED overnight for re-evaluation tomorrow. Holding orders written.   Final Clinical Impressions(s) / ED Diagnoses   Final diagnoses:  None    ED Discharge Orders    None       Francine Graven, DO 07/14/19 2333

## 2019-07-14 NOTE — ED Notes (Signed)
Pt changed into burgandy scrubs and wanded by security. Pt belongings placed at triage nurses station in cabinet.
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
## 2019-07-15 DIAGNOSIS — Z915 Personal history of self-harm: Secondary | ICD-10-CM

## 2019-07-15 DIAGNOSIS — F250 Schizoaffective disorder, bipolar type: Secondary | ICD-10-CM

## 2019-07-15 NOTE — ED Notes (Signed)
Urine specimen requested pt unable to void 

## 2019-07-15 NOTE — ED Notes (Signed)
Pt was encouraged to provide urine sample.

## 2019-07-15 NOTE — BH Assessment (Addendum)
[facility] Assessment Progress Note  Per Buford Dresser, DO, this pt does not require psychiatric hospitalization at this time.  Pt is to be discharged from [facility] with recommendation to continue treatment with the [facility] ACT Team.  This has been included in pt's discharge instructions.  Pt's nurse, Nena Jordan, has been notified.  Jalene Mullet, MA Triage Specialist [phone]   Addendum:  Per Sheran Fava, PMHNP, pt was supposed to have a virtual meeting with ACT Team this morning at 11:30.  At 11:50 this writer called [facility] to see if this had taken place, and to notify them of disposition.  Call was answered by Marianna Fuss.  She reports that pt was tentatively scheduled for an intake appointment, but they were waiting for the [facility] to call them with pt's phone number.  Marianna Fuss agrees to look into whether a clinician can call at this time to complete intake, and will have someone call me back.  Return call is pending as of this writing.  Nena Jordan has been informed.  Jalene Mullet, [place] [facility] Coordinator [phone]   Addendum:  Call back from [place] Team is still pending as of this writing.  Per Dr Mariea Clonts, pt is to be discharged from [facility] no later than 15:00.  Nena Jordan has been notified.  Jalene Mullet, [place] Coordinator [phone]

## 2019-07-15 NOTE — Discharge Instructions (Signed)
For your behavioral health needs, you are advised to continue treatment with the [facility] ACT Team: ° °     [facility] ACT Team °     [facility], [address] °     [address] °     [place], [place]  [address] °     [phone] °     Crisis number: [phone] °

## 2019-07-15 NOTE — Consult Note (Addendum)
Telepsych Consultation   Reason for Consult:  Suicidal ideation Referring Physician: Renne CriglerGeiple, Joshua, PA-C  Location of Patient: [facility] Location of Provider: [facility]  Patient Identification: [name] [name] MRN:  [id number] Principal Diagnosis: Schizoaffective disorder, bipolar type (HCC) Diagnosis:  Principal Problem:   Schizoaffective disorder, bipolar type (HCC)   Total Time spent with patient: 30 minutes  Subjective: [name] is trying to kill me. Im moving at the end of the week. [facility] [place].  Im supposed to start a new ACTT and have an interview today.   [name] [name] is a 43 y.o. female who [facility] after pt called 911 due to having a plan to slit her wrists after God sang a song to her that upset her and He told her He was going to kill her. Pt shares she has had had previous experiences of God talking to her; she states she was recently hospitalized at [facility] and that she was d/c 2-3 weeks ago. Pt states she does not understand why she is not receiving the help she needs; she states she understands she has schizophrenia but that it is not a psychiatric disorder. Pt shares she has attempted to kill herself on two occasions; she shares she has been hospitalized numerous times and that her plan today was to slit her wrists.  Pt denies HI, AVH, NSSIB, access to guns/weapons, engagement in the legal system, and SA.  Pt declined providing clinician with the name/phone number of a friend/family member to contact for collateral information, stating she doesn't want her information discussed.  Pt is oriented x4. Her recent and remote memory is intact. Pt was cooperative throughout the assessment process. Pt's insight, judgement, and impulse control is impaired at this time.   HPI:  Patient seen via tele psych by this provider; chart reviewed and consulted with Dr. Sharma CovertNorman on 07/15/19.  On  evaluation [name] [name] reports [facility] in which she was started on oral Zyprexa, and transitioned to [place]vega Sustenna x 2. Her next dose is due on 07/31/19. She has an appointment today with [facility] at 1130, however no one was available to see the patient.  Patient denies suicidal/self-harm/homicidal ideation, psychosis, and paranoia.     During evaluation [name] [name] is sitting up in bed; she is alert/oriented x 4; calm/cooperative; and mood congruent with affect.  Patient is speaking in a clear tone at moderate volume, and normal pace; with good eye contact.  Her thought process is coherent and relevant; There is no indication that she is currently responding to internal/external stimuli or experiencing delusional thought content.  Patient denies suicidal/self-harm/homicidal ideation, psychosis, and paranoia.  Patient has remained calm throughout assessment and has answered questions appropriately.   Past Psychiatric History: Schizoaffective disorder bipolar type  Risk to Self: None. Denies SI. Risk to Others: Homicidal Ideation: No Thoughts of Harm to Others: No Current Homicidal Intent: No Current Homicidal Plan: No Access to Homicidal Means: No Identified Victim: None noted History of harm to others?: No Assessment of Violence: None Noted Violent Behavior Description: None noted Does patient have access to weapons?: No(Pt denied access to guns/weapons) Criminal Charges Pending?: No Does patient have a court date: No Prior Inpatient Therapy: Prior Inpatient Therapy: Yes Prior Therapy Dates: Multiple; most recent was 06/2019 Prior [facility]Provider[facility] Reason for Treatment: Schizoeffective disorder Prior Outpatient Therapy: Prior Outpatient Therapy: No Does patient have an ACCT  team?: Yes Does patient have Intensive In-House Services?  : No Does patient have [facility] services? : Yes Does patient have P4CC services?: No  Past Medical History:   Past Medical History:  Diagnosis Date  . Arthritis   . Depression    History reviewed. No pertinent surgical history. Family History: History reviewed. No pertinent family history. Family Psychiatric  History: None per chart review.  Social History:  Social History   Substance and Sexual Activity  Alcohol Use Not Currently     Social History   Substance and Sexual Activity  Drug Use Not Currently    Social History   Socioeconomic History  . Marital status: Single    Spouse name: Not on file  . Number of children: Not on file  . Years of education: Not on file  . Highest education level: Not on file  Occupational History  . Not on file  Social Needs  . Financial resource strain: Not on file  . Food insecurity    Worry: Not on file    Inability: Not on file  . Transportation needs    Medical: Not on file    Non-medical: Not on file  Tobacco Use  . Smoking status: Never Smoker  . Smokeless tobacco: Never Used  Substance and Sexual Activity  . Alcohol use: Not Currently  . Drug use: Not Currently  . Sexual activity: Not Currently  Lifestyle  . Physical activity    Days per week: Not on file    Minutes per session: Not on file  . Stress: Not on file  Relationships  . Social Musician on phone: Not on file    Gets together: Not on file    Attends religious service: Not on file    Active member of club or organization: Not on file    Attends meetings of clubs or organizations: Not on file    Relationship status: Not on file  Other Topics Concern  . Not on file  Social History Narrative  . Not on file   Additional Social History: N/A    Allergies:  No Known Allergies  Labs:  Results for orders placed or performed during the hospital encounter of 07/14/19 (from the past 48 hour(s))  Ethanol     Status: None   Collection Time: 07/14/19  9:34 PM  Result Value Ref Range   Alcohol, Ethyl (B) <10 <10 mg/dL    Comment: (NOTE) Lowest detectable  limit for serum alcohol is 10 mg/dL. For medical purposes only. Performed at [facility], [address] [place], [place] [address]   Basic metabolic panel     Status: None   Collection Time: 07/14/19  9:34 PM  Result Value Ref Range   Sodium 138 135 - 145 mmol/L   Potassium 3.6 3.5 - 5.1 mmol/L   Chloride 106 98 - 111 mmol/L   CO2 24 22 - 32 mmol/L   Glucose, Bld 90 70 - 99 mg/dL   BUN 12 6 - 20 mg/dL   Creatinine, Ser 3.32 0.44 - 1.00 mg/dL   Calcium 9.2 8.9 - 95.1 mg/dL   GFR calc non Af Amer >60 >60 mL/min   GFR calc Af Amer >60 >60 mL/min   Anion gap 8 5 - 15    Comment: Performed at [facility], [address] [place], [place] [address]  CBC with Differential     Status: Abnormal   Collection Time: 07/14/19  9:34 PM  Result Value Ref Range   WBC 5.1 4.0 - 10.5 K/uL   RBC 4.12 3.87 - 5.11 MIL/uL   Hemoglobin 11.7 (L) 12.0 - 15.0 g/dL   HCT 60.437.2 54.036.0 - 98.146.0 %   MCV 90.3 80.0 - 100.0 fL   MCH 28.4 26.0 - 34.0 pg   MCHC 31.5 30.0 - 36.0 g/dL   RDW 19.113.6 47.811.5 - 29.515.5 %   Platelets 384 150 - 400 K/uL   nRBC 0.0 0.0 - 0.2 %   Neutrophils Relative % 46 %   Neutro Abs 2.3 1.7 - 7.7 K/uL   Lymphocytes Relative 41 %   Lymphs Abs 2.1 0.7 - 4.0 K/uL   Monocytes Relative 11 %   Monocytes Absolute 0.6 0.1 - 1.0 K/uL   Eosinophils Relative 2 %   Eosinophils Absolute 0.1 0.0 - 0.5 K/uL   Basophils Relative 0 %   Basophils Absolute 0.0 0.0 - 0.1 K/uL   Immature Granulocytes 0 %   Abs Immature Granulocytes 0.02 0.00 - 0.07 K/uL    Comment: Performed at [facility], [address] [place], [place] [address]  I-Stat beta hCG blood, ED     Status: None   Collection Time: 07/14/19  9:53 PM  Result Value Ref Range   I-stat hCG, quantitative <5.0 <5 mIU/mL   Comment 3            Comment:   GEST. AGE      CONC.  (mIU/mL)   <=1 WEEK        5 - 50     2 WEEKS       50 - 500     3 WEEKS       100 - 10,000     4 WEEKS      1,000 - 30,000        FEMALE AND NON-PREGNANT FEMALE:     LESS THAN 5 mIU/mL     Medications:  No current facility-administered medications for this encounter.    Current Outpatient Medications  Medication Sig Dispense Refill  . [START ON 07/31/2019] paliperidone (INVEGA SUSTENNA) 156 MG/ML SUSY injection Inject 156 mg into the muscle once.    Marland Kitchen. OLANZapine (ZYPREXA) 10 MG tablet Take 1 tablet (10 mg total) by mouth at bedtime. (Patient not taking: Reported on 07/05/2019) 30 tablet 0  . potassium chloride SA (K-DUR) 20 MEQ tablet Take 1 tablet (20 mEq total) by mouth daily. (Patient not taking: Reported on 07/05/2019) 3 tablet 0    Musculoskeletal: Strength & Muscle Tone: within normal limits Gait & Station: normal Patient leans: N/A  Psychiatric Specialty Exam: Physical Exam  Nursing note and vitals reviewed. Constitutional: She appears well-nourished. No distress.  Respiratory: Effort normal.  Musculoskeletal: Normal range of motion.  Neurological: She is alert.  Psychiatric: Her speech is normal and behavior is normal. Judgment normal. Thought content is not paranoid and not delusional. Cognition and memory are normal. She exhibits a depressed mood (Stable ). She expresses no homicidal and no suicidal ideation.    Review of Systems  Psychiatric/Behavioral: Depression: Stable. Hallucinations: Denies. Memory loss: Denies. Substance abuse: Denies. Suicidal ideas: Denies. Nervous/anxious: Stable. Insomnia: Denies.   All other systems reviewed and are negative.   Blood pressure (!) 91/52, pulse 73, temperature 98.7 F (37.1 C), temperature source Oral, resp. rate 16, height 4\' 10"  (1.473 m), weight 49.4 kg, last menstrual period 06/23/2019, SpO2 98 %.Body mass index is 22.78 kg/m.  General Appearance: Casual  Eye  Contact:  Good  Speech:  Clear and Coherent and Normal Rate  Volume:  Normal  Mood:  Appropriate  Affect:  Appropriate and Congruent  Thought Process:  Coherent, Goal  Directed and Descriptions of Associations: Intact  Orientation:  Full (Time, Place, and Person)  Thought Content:  WDL  Suicidal Thoughts:  No  Homicidal Thoughts:  No  Memory:  Immediate;   Good Recent;   Good Remote;   Good  Judgement:  Intact  Insight:  Fair and Present  Psychomotor Activity:  Normal  Concentration:  Concentration: Good and Attention Span: Good  Recall:  Good  Fund of Knowledge:  Fair  Language:  Good  Akathisia:  No  Handed:  Right  AIMS (if indicated):   N/A  Assets:  Communication Skills Desire for Improvement  ADL's:  Intact  Cognition:  WNL  Sleep:   N/A     Treatment Plan Summary: Plan Outpatient services; housing resources  Disposition: No evidence of imminent risk to self or others at present.   Patient does not meet criteria for psychiatric inpatient admission. Supportive therapy provided about ongoing stressors. Discussed crisis plan, support from social network, calling 911, coming to the Emergency Department, and calling Suicide Hotline.  This service was provided via telemedicine using a 2-way, interactive audio and video technology.  Names of all persons participating in this telemedicine service and their role in this encounter. Name: Sheran Fava, NP Role: Tele psych assessment  Name: Dr. Mariea Clonts Role: Psychiatrist  Name: Cassell Smiles Role: Patient    Suella Broad, [place] 07/15/2019 2:01 PM   Patient seen by telemedicine for psychiatric evaluation, chart reviewed and case discussed with the physician extender and developed treatment plan. Reviewed the information documented and agree with the treatment plan.  Buford Dresser, DO 07/15/19 6:38 PM

## 2019-07-28 DIAGNOSIS — Z03818 Encounter for observation for suspected exposure to other biological agents ruled out: Secondary | ICD-10-CM | POA: Diagnosis not present

## 2019-08-04 ENCOUNTER — Emergency Department (HOSPITAL_COMMUNITY)
Admission: EM | Admit: 2019-08-04 | Discharge: 2019-08-05 | Disposition: A | Discharge status: Home / Self Care | Payer: Medicare Other | Attending: Emergency Medicine | Admitting: Emergency Medicine

## 2019-08-04 ENCOUNTER — Encounter (HOSPITAL_COMMUNITY): Payer: Self-pay | Admitting: Emergency Medicine

## 2019-08-04 DIAGNOSIS — S60812A Abrasion of left wrist, initial encounter: Secondary | ICD-10-CM | POA: Insufficient documentation

## 2019-08-04 DIAGNOSIS — M1990 Unspecified osteoarthritis, unspecified site: Secondary | ICD-10-CM | POA: Diagnosis not present

## 2019-08-04 DIAGNOSIS — Y939 Activity, unspecified: Secondary | ICD-10-CM | POA: Insufficient documentation

## 2019-08-04 DIAGNOSIS — X788XXA Intentional self-harm by other sharp object, initial encounter: Secondary | ICD-10-CM | POA: Diagnosis not present

## 2019-08-04 DIAGNOSIS — R45851 Suicidal ideations: Secondary | ICD-10-CM | POA: Diagnosis not present

## 2019-08-04 DIAGNOSIS — Y999 Unspecified external cause status: Secondary | ICD-10-CM | POA: Insufficient documentation

## 2019-08-04 DIAGNOSIS — Z79899 Other long term (current) drug therapy: Secondary | ICD-10-CM | POA: Diagnosis not present

## 2019-08-04 DIAGNOSIS — Z590 Homelessness: Secondary | ICD-10-CM | POA: Insufficient documentation

## 2019-08-04 DIAGNOSIS — Z03818 Encounter for observation for suspected exposure to other biological agents ruled out: Secondary | ICD-10-CM | POA: Diagnosis not present

## 2019-08-04 DIAGNOSIS — Z915 Personal history of self-harm: Secondary | ICD-10-CM | POA: Diagnosis not present

## 2019-08-04 DIAGNOSIS — F259 Schizoaffective disorder, unspecified: Secondary | ICD-10-CM | POA: Insufficient documentation

## 2019-08-04 DIAGNOSIS — R4781 Slurred speech: Secondary | ICD-10-CM | POA: Diagnosis not present

## 2019-08-04 DIAGNOSIS — Y929 Unspecified place or not applicable: Secondary | ICD-10-CM | POA: Insufficient documentation

## 2019-08-04 DIAGNOSIS — Z20828 Contact with and (suspected) exposure to other viral communicable diseases: Secondary | ICD-10-CM | POA: Insufficient documentation

## 2019-08-04 DIAGNOSIS — IMO0002 Reserved for concepts with insufficient information to code with codable children: Secondary | ICD-10-CM

## 2019-08-04 DIAGNOSIS — F250 Schizoaffective disorder, bipolar type: Secondary | ICD-10-CM | POA: Diagnosis not present

## 2019-08-04 DIAGNOSIS — T148XXA Other injury of unspecified body region, initial encounter: Secondary | ICD-10-CM

## 2019-08-04 DIAGNOSIS — Z23 Encounter for immunization: Secondary | ICD-10-CM | POA: Insufficient documentation

## 2019-08-04 LAB — COMPREHENSIVE METABOLIC PANEL
ALT: 10 U/L (ref 0–44)
AST: 16 U/L (ref 15–41)
Albumin: 4.4 g/dL (ref 3.5–5.0)
Alkaline Phosphatase: 44 U/L (ref 38–126)
Anion gap: 10 (ref 5–15)
BUN: 12 mg/dL (ref 6–20)
CO2: 23 mmol/L (ref 22–32)
Calcium: 9.4 mg/dL (ref 8.9–10.3)
Chloride: 107 mmol/L (ref 98–111)
Creatinine, Ser: 0.53 mg/dL (ref 0.44–1.00)
GFR calc Af Amer: >60 mL/min (ref >60)
GFR calc non Af Amer: >60 mL/min (ref >60)
Glucose, Bld: 99 mg/dL (ref 70–99)
Potassium: 3.6 mmol/L (ref 3.5–5.1)
Sodium: 140 mmol/L (ref 135–145)
Total Bilirubin: 0.3 mg/dL (ref 0.3–1.2)
Total Protein: 7.2 g/dL (ref 6.5–8.1)

## 2019-08-04 LAB — ETHANOL: Alcohol, Ethyl (B): <10 mg/dL (ref <10)

## 2019-08-04 LAB — CBC
HCT: 34.4 % — ABNORMAL LOW (ref 36.0–46.0)
Hemoglobin: 10.8 g/dL — ABNORMAL LOW (ref 12.0–15.0)
MCH: 28.5 pg (ref 26.0–34.0)
MCHC: 31.4 g/dL (ref 30.0–36.0)
MCV: 90.8 fL (ref 80.0–100.0)
Platelets: 287 10*3/uL (ref 150–400)
RBC: 3.79 MIL/uL — ABNORMAL LOW (ref 3.87–5.11)
RDW: 14 % (ref 11.5–15.5)
WBC: 4.2 10*3/uL (ref 4.0–10.5)
nRBC: 0 % (ref 0.0–0.2)

## 2019-08-04 LAB — HCG, QUANTITATIVE, PREGNANCY: hCG, Beta Chain, Quant, S: <1 m[IU]/mL (ref <5)

## 2019-08-04 LAB — SALICYLATE LEVEL: Salicylate Lvl: <7 mg/dL (ref 2.8–30.0)

## 2019-08-04 LAB — ACETAMINOPHEN LEVEL: Acetaminophen (Tylenol), Serum: <10 ug/mL — ABNORMAL LOW (ref 10–30)

## 2019-08-04 MED ORDER — ACETAMINOPHEN 325 MG PO TABS: 650.0000 mg | ORAL_TABLET | ORAL | Status: DC | PRN

## 2019-08-04 MED ORDER — TETANUS-DIPHTH-ACELL PERTUSSIS 5-2.5-18.5 LF-MCG/0.5 IM SUSP
0.5000 mL | Freq: Once | INTRAMUSCULAR | Status: AC
  Administered 2019-08-05: 0.5 mL via INTRAMUSCULAR
  Filled 2019-08-04: qty 0.5

## 2019-08-04 MED ORDER — ONDANSETRON HCL 4 MG PO TABS
4.0000 mg | ORAL_TABLET | Freq: Three times a day (TID) | ORAL | Status: DC | PRN
  Filled 2019-08-04: qty 1

## 2019-08-04 NOTE — ED Triage Notes (Signed)
Patient here via EMS with complaints of SI. Believes God is trying to kill her. Took a pair of scissors and cut her left wrist. Superficial, band aid.

## 2019-08-04 NOTE — BHH Counselor (Signed)
Clinician awaiting nurses' call to set up TTS cart or to completed assessment via phone.    Vertell Novak, MS, [facility], [facility] Triage Specialist [phone].

## 2019-08-04 NOTE — BHH Counselor (Signed)
Clinician attempted to call triage nurse however no answer, clinician to call back.    Darlean Warmoth D Ladan Vanderzanden, MS, [facility], [facility] Triage Specialist [phone]  

## 2019-08-04 NOTE — BHH Counselor (Addendum)
Clinician noted the pt moved from triage to a [place] bed. Clinician spoke Ilene, RN to express she was ready to complete the pt's TTS assessment. Clinician gave her contact number. Ilene, RN to follow up.    Vertell Novak, MS, [facility], [facility] Triage Specialist [phone].

## 2019-08-04 NOTE — ED Provider Notes (Signed)
[place] DEPT Provider Note   CSN: [id number] Arrival date & time: 08/04/19  1945     History   Chief Complaint Chief Complaint  Patient presents with  . Suicidal    HPI [name] [name] is a 43 y.o. female.  Presents the ER after cutting her wrists.  Patient states that she was angry and upset about someone else and took it out by cutting her left wrist.  Used scissors, states no active bleeding.  To me she denied any thoughts of suicide.  Denies any ongoing thoughts of self-harm.  No auditory visual hallucinations.  Endorses prior history of depression, states she gets monthly shot.  Per report from EMS, concern for SI, believing God is trying to kill her.     HPI  Past Medical History:  Diagnosis Date  . Arthritis   . Depression     Patient Active Problem List   Diagnosis Date Noted  . Homelessness 07/06/2019  . Adjustment disorder with disturbance of emotion 07/06/2019  . Hallucinations   . Schizoaffective disorder, bipolar type ([place]) 09/01/2018    History reviewed. No pertinent surgical history.   OB History   No obstetric history on file.      Home Medications    Prior to Admission medications   Medication Sig Start Date End Date Taking? Authorizing Provider  OLANZapine (ZYPREXA) 10 MG tablet Take 1 tablet (10 mg total) by mouth at bedtime. Patient not taking: Reported on 07/05/2019 09/06/18   Pennelope Bracken, MD  paliperidone (INVEGA SUSTENNA) 156 MG/ML SUSY injection Inject 156 mg into the muscle once. 07/31/19   [provider]  potassium chloride SA (K-DUR) 20 MEQ tablet Take 1 tablet (20 mEq total) by mouth daily. Patient not taking: Reported on 07/05/2019 06/11/19   Petrucelli, Glynda Jaeger, PA-C    Family History No family history on file.  Social History Social History   Tobacco Use  . Smoking status: Never Smoker  . Smokeless tobacco: Never Used  Substance Use Topics  . Alcohol use: Not Currently   . Drug use: Not Currently     Allergies   Patient has no known allergies.   Review of Systems Review of Systems  Constitutional: Negative for chills and fever.  HENT: Negative for ear pain and sore throat.   Eyes: Negative for pain and visual disturbance.  Respiratory: Negative for cough and shortness of breath.   Cardiovascular: Negative for chest pain and palpitations.  Gastrointestinal: Negative for abdominal pain and vomiting.  Genitourinary: Negative for dysuria and hematuria.  Musculoskeletal: Negative for arthralgias and back pain.  Skin: Negative for color change and rash.  Neurological: Negative for seizures and syncope.  Psychiatric/Behavioral: Positive for self-injury. Negative for hallucinations. The patient is not nervous/anxious and is not hyperactive.   All other systems reviewed and are negative.    Physical Exam Updated Vital Signs BP 97/62 (BP Location: Right Arm)   Pulse 77   Temp 98.7 F (37.1 C) (Oral)   Resp 18   SpO2 98%   Physical Exam Vitals signs and nursing note reviewed.  Constitutional:      General: She is not in acute distress.    Appearance: She is well-developed.  HENT:     Head: Normocephalic and atraumatic.  Eyes:     Conjunctiva/sclera: Conjunctivae normal.  Neck:     Musculoskeletal: Neck supple.  Cardiovascular:     Rate and Rhythm: Normal rate and regular rhythm.     Heart sounds:  No murmur.  Pulmonary:     Effort: Pulmonary effort is normal. No respiratory distress.     Breath sounds: Normal breath sounds.  Abdominal:     Palpations: Abdomen is soft.     Tenderness: There is no abdominal tenderness.  Musculoskeletal:     Comments: Very superficial laceration, 1 cm noted over volar aspect of left wrist, no active bleeding; distal motor, sensation intact, normal radial pulse  Skin:    General: Skin is warm and dry.     Capillary Refill: Capillary refill takes less than 2 seconds.  Neurological:     General: No focal  deficit present.     Mental Status: She is alert and oriented to person, place, and time.      ED Treatments / Results  Labs (all labs ordered are listed, but only abnormal results are displayed) Labs Reviewed  ACETAMINOPHEN LEVEL - Abnormal; Notable for the following components:      Result Value   Acetaminophen (Tylenol), Serum <10 (*)    All other components within normal limits  CBC - Abnormal; Notable for the following components:   RBC 3.79 (*)    Hemoglobin 10.8 (*)    HCT 34.4 (*)    All other components within normal limits  SARS CORONAVIRUS 2 (HOSPITAL ORDER, PERFORMED IN [facility])  COMPREHENSIVE METABOLIC PANEL  ETHANOL  SALICYLATE LEVEL  HCG, QUANTITATIVE, PREGNANCY  RAPID URINE DRUG SCREEN, HOSP PERFORMED  I-STAT BETA HCG BLOOD, ED ([facility], [facility], [facility] ONLY)    EKG None  Radiology No results found.  Procedures Procedures (including critical care time)  Medications Ordered in ED Medications  Tdap (BOOSTRIX) injection 0.5 mL (has no administration in time range)  acetaminophen (TYLENOL) tablet 650 mg (has no administration in time range)  ondansetron (ZOFRAN) tablet 4 mg (has no administration in time range)     Initial Impression / Assessment and Plan / ED Course  I have reviewed the triage vital signs and the nursing notes.  Pertinent labs & imaging results that were available during my care of the patient were reviewed by me and considered in my medical decision making (see chart for details).  Clinical Course as of Aug 04 18  Mon Aug 04, 2019  2359 Received notice from behavioral health specialist, meets inpatient criteria, patient currently voluntary at this time   [RD]    Clinical Course User Index [RD] Milagros Loll, MD      43 year old lady presented to ER after self-harm, cutting wrists.  No significant laceration on exam, not requiring repair.  She denies any frank SI to me however had made suicidal statements to others.   TTS evaluated patient and recommended inpatient.  Patient is voluntary at this time.  They made arrangements at behavioral health.  Patient will get the COVID swab and go there.  Updated tetanus.  Final Clinical Impressions(s) / ED Diagnoses   Final diagnoses:  Suicidal ideation  H/O self-harm  Superficial abrasion    ED Discharge Orders    None       Milagros Loll, MD 08/05/19 0020

## 2019-08-04 NOTE — BHH Counselor (Signed)
Clinician attempted to call triage nurse however no answer, clinician to call back.    Vertell Novak, [place], [facility], [facility] Triage Specialist [phone]

## 2019-08-05 ENCOUNTER — Inpatient Hospital Stay (HOSPITAL_COMMUNITY)
Admission: AD | Admit: 2019-08-05 | Discharge: 2019-08-08 | DRG: 885 | Disposition: A | Discharge status: Home / Self Care | Payer: Medicare Other | Source: Intra-hospital | Attending: Psychiatry | Admitting: Psychiatry

## 2019-08-05 DIAGNOSIS — Z79899 Other long term (current) drug therapy: Secondary | ICD-10-CM

## 2019-08-05 DIAGNOSIS — Z915 Personal history of self-harm: Secondary | ICD-10-CM

## 2019-08-05 DIAGNOSIS — R4781 Slurred speech: Secondary | ICD-10-CM | POA: Diagnosis present

## 2019-08-05 DIAGNOSIS — F250 Schizoaffective disorder, bipolar type: Principal | ICD-10-CM | POA: Diagnosis present

## 2019-08-05 DIAGNOSIS — M1990 Unspecified osteoarthritis, unspecified site: Secondary | ICD-10-CM | POA: Diagnosis present

## 2019-08-05 DIAGNOSIS — Z23 Encounter for immunization: Secondary | ICD-10-CM | POA: Diagnosis not present

## 2019-08-05 LAB — SARS CORONAVIRUS 2 BY RT PCR (HOSPITAL ORDER, PERFORMED IN ~~LOC~~ HOSPITAL LAB): SARS Coronavirus 2: NEGATIVE

## 2019-08-05 LAB — RAPID URINE DRUG SCREEN, HOSP PERFORMED
Amphetamines: NOT DETECTED
Barbiturates: NOT DETECTED
Benzodiazepines: NOT DETECTED
Cocaine: NOT DETECTED
Opiates: NOT DETECTED
Tetrahydrocannabinol: NOT DETECTED

## 2019-08-05 LAB — I-STAT BETA HCG BLOOD, ED (MC, WL, AP ONLY): I-stat hCG, quantitative: <5 m[IU]/mL (ref <5)

## 2019-08-05 MED ORDER — HYDROXYZINE HCL 25 MG PO TABS: 25.0000 mg | ORAL_TABLET | Freq: Three times a day (TID) | ORAL | Status: DC | PRN

## 2019-08-05 MED ORDER — ACETAMINOPHEN 325 MG PO TABS: 650.0000 mg | ORAL_TABLET | Freq: Four times a day (QID) | ORAL | Status: DC | PRN

## 2019-08-05 MED ORDER — ALUM & MAG HYDROXIDE-SIMETH 200-200-20 MG/5ML PO SUSP: 30.0000 mL | ORAL | Status: DC | PRN

## 2019-08-05 MED ORDER — BENZTROPINE MESYLATE 1 MG PO TABS
1.0000 mg | ORAL_TABLET | Freq: Two times a day (BID) | ORAL | Status: DC
  Administered 2019-08-05 – 2019-08-08 (×6): 1 mg via ORAL
  Filled 2019-08-05 (×11): qty 1

## 2019-08-05 MED ORDER — RISPERIDONE 2 MG PO TABS
2.0000 mg | ORAL_TABLET | Freq: Two times a day (BID) | ORAL | Status: DC
  Administered 2019-08-05 – 2019-08-07 (×4): 2 mg via ORAL
  Filled 2019-08-05 (×7): qty 1

## 2019-08-05 MED ORDER — MAGNESIUM HYDROXIDE 400 MG/5ML PO SUSP: 30.0000 mL | Freq: Every day | ORAL | Status: DC | PRN

## 2019-08-05 MED ORDER — TEMAZEPAM 30 MG PO CAPS
30.0000 mg | ORAL_CAPSULE | Freq: Every day | ORAL | Status: DC
  Administered 2019-08-05 – 2019-08-07 (×2): 30 mg via ORAL
  Filled 2019-08-05 (×3): qty 1
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
## 2019-08-05 NOTE — [facility] Suicide Risk Assessment (Signed)
[name] [name] Admission Suicide Risk Assessment   Total Time spent with patient: 45 minutes Principal Problem: <principal problem not specified> Diagnosis:  Active Problems:   Schizoaffective disorder, bipolar type (HCC)  Subjective Data: Recurrent delusions/hallucination believing God is directing her towards suicide or will kill her  Continued Clinical Symptoms:    The "Alcohol Use Disorders Identification Test", Guidelines for Use in Primary Care, Second Edition.  World Pharmacologist [facility]). Score between 0-7:  no or low risk or alcohol related problems. Score between 8-15:  moderate risk of alcohol related problems. Score between 16-19:  high risk of alcohol related problems. Score 20 or above:  warrants further diagnostic evaluation for alcohol dependence and treatment.   CLINICAL FACTORS: Acute psychosis Musculoskeletal: Strength & Muscle Tone: within normal limits Gait & Station: normal Patient leans: N/A  Psychiatric Specialty Exam: Physical Exam  Nursing note and vitals reviewed. Constitutional: She appears well-developed and well-nourished.  Cardiovascular: Normal rate and regular rhythm.    Review of Systems  Constitutional: Negative.   HENT: Negative.   Cardiovascular: Negative.   Genitourinary: Negative.   Skin: Negative.   Neurological: Negative.   Superficial scratches left wrist  Blood pressure (!) 113/94, pulse 77, temperature 98.4 F (36.9 C), temperature source Oral, resp. rate 18, height 4\' 10"  (1.473 m), weight 49.4 kg.Body mass index is 22.78 kg/m.  General Appearance: Casual  Eye Contact:  Fair  Speech:  Slow and Slurred  Volume:  Decreased  Mood:  Dysphoric  Affect:  Blunt  Thought Process:  Linear and Descriptions of Associations: Loose  Orientation:  Full (Time, Place, and Person)  Thought Content:  Illogical, Delusions and Hallucinations: Auditory  Suicidal Thoughts:  Yes.  without intent/plan  Homicidal Thoughts:  No  Memory:  Immediate;    Poor Recent;   Poor Remote;   Fair  Judgement:  Impaired  Insight:  Lacking  Psychomotor Activity:  Decreased  Concentration:  Concentration: Poor and Attention Span: Poor  Recall:  Poor  Fund of Knowledge:  Poor  Language:  Fair  Akathisia:  Negative  Handed:  Right  AIMS (if indicated):     Assets:  Physical Health Resilience Social Support  ADL's:  Intact  Cognition:  WNL  Sleep:       COGNITIVE FEATURES THAT CONTRIBUTE TO RISK:  Loss of executive function and Polarized thinking    SUICIDE RISK:   Moderate:  Frequent suicidal ideation with limited intensity, and duration, some specificity in terms of plans, no associated intent, good self-control, limited dysphoria/symptomatology, some risk factors present, and identifiable protective factors, including available and accessible social support.  PLAN OF CARE: see eval  I certify that inpatient services furnished can reasonably be expected to improve the patient's condition.   [name], MD 08/05/2019, 9:24 AM

## 2019-08-05 NOTE — Progress Notes (Signed)
D: Pt denies SI/HI/AVH. Pt is pleasant and cooperative. Pt avoidant, pt appeared to be responding to internal stimuli even though pt denied seeing / hearing things A: Pt was offered support and encouragement. Pt was given scheduled medications. Pt was encourage to attend groups. Q 15 minute checks were done for safety.  R: safety maintained on unit.

## 2019-08-05 NOTE — Progress Notes (Signed)
Pt brought to the unit . Pt searched by Romie Minus . Pt signed paperwork. Pt wanted to go to breakfast, so pt was allowed to get something to eat. Pt admission needs to be completed.

## 2019-08-05 NOTE — Progress Notes (Signed)
Recreation Therapy Notes  INPATIENT RECREATION THERAPY ASSESSMENT  Patient Details Name: [name] [name] MRN: [id number] DOB: [birth year] Today's Date: 08/05/2019       Information Obtained From: Patient  Able to Participate in Assessment/Interview: Yes  Patient Presentation: Alert  Reason for Admission (Per Patient): Self-injurious Behavior  Patient Stressors: Family, Other (Comment)(Pt would not specify)  Coping Skills:   Isolation, Write, TV, Music, Exercise, Prayer, Avoidance, Read, Hot Bath/Shower, Other (Comment)(Cleaning)  Leisure Interests (2+):  [employer], Music - Listen, Individual - Other (Comment)(Cleaning)  Frequency of Recreation/Participation: Weekly  Awareness of Community Resources:  Yes  Community Resources:  Library, Tax inspector  Current Use: Yes  If no, Barriers?:    Expressed Interest in Sophia: No  [employer] of Residence:  Investment banker, corporate  Patient Main Form of Transportation: Diplomatic Services operational officer  Patient Strengths:  Artistry; Sports coach; Determination  Patient Identified Areas of Improvement:  Solicitor; Mobility  Patient Goal for Hospitalization:  "to take care of myself, think positive, get all the help I need and be happy"  Current SI (including self-harm):  No  Current HI:  No  Current AVH: No  Staff Intervention Plan: Group Attendance, Collaborate with Interdisciplinary Treatment Team  Consent to Intern Participation: N/A     Victorino Sparrow, LRT/CTRS   Victorino Sparrow A 08/05/2019, 11:36 AM

## 2019-08-05 NOTE — Progress Notes (Signed)
Recreation Therapy Notes  Date: 9.29.20 Time: 1000 Location: [address] Hall Dayroom  Group Topic: Coping Skills  Goal Area(s) Addresses:  Patient will identify positive coping skills. Patient will identify benefits of using coping skills post d/c.  Behavioral Response:  None  Intervention: Worksheet, pencils, white board, dry erase marker  Activity: Mind map.  LRT and patients filled in the first 8 boxes of the mind map together (substance abuse, psychological abuse, eating disorders, physical abuse, family, anger management, finances and work).  Patients were then given time to come up with at least 3 coping skills for each area identified.  The group would come back together and LRT wrote the coping skills on the board.  Education:Coping Skills, Discharge Planning.   Education Outcome: Acknowledges understanding/In group clarification offered/Needs additional education.   Clinical Observations/Feedback: Pt came for the last few minutes of group.  Pt did not participate.    Victorino Sparrow, LRT/CTRS    Victorino Sparrow A 08/05/2019 11:24 AM

## 2019-08-05 NOTE — ED Notes (Signed)
ED TO INPATIENT HANDOFF REPORT  ED Nurse Name and Phone #: jon wled   S Name/Age/Gender [name] [name] 43 y.o. female Room/Bed: WHALC/WHALC  Code Status   Code Status: Full Code  Home/SNF/Other Home {Patient oriented alert x 3  Is this baseline? Yes   Triage Complete: Triage complete  Chief Complaint SI  Triage Note Patient here via EMS with complaints of SI. Believes God is trying to kill her. Took a pair of scissors and cut her left wrist. Superficial, band aid.    Allergies No Known Allergies  Level of Care/Admitting Diagnosis ED Disposition    None      B Medical/Surgery History Past Medical History:  Diagnosis Date  . Arthritis   . Depression    History reviewed. No pertinent surgical history.   A IV Location/Drains/Wounds Patient Lines/Drains/Airways Status   Active Line/Drains/Airways    None          Intake/Output Last 24 hours No intake or output data in the 24 hours ending 08/05/19 0524  Labs/Imaging Results for orders placed or performed during the hospital encounter of 08/04/19 (from the past 48 hour(s))  Comprehensive metabolic panel     Status: None   Collection Time: 08/04/19  8:04 PM  Result Value Ref Range   Sodium 140 135 - 145 mmol/L   Potassium 3.6 3.5 - 5.1 mmol/L   Chloride 107 98 - 111 mmol/L   CO2 23 22 - 32 mmol/L   Glucose, Bld 99 70 - 99 mg/dL   BUN 12 6 - 20 mg/dL   Creatinine, Ser 0.53 0.44 - 1.00 mg/dL   Calcium 9.4 8.9 - 10.3 mg/dL   Total Protein 7.2 6.5 - 8.1 g/dL   Albumin 4.4 3.5 - 5.0 g/dL   AST 16 15 - 41 U/L   ALT 10 0 - 44 U/L   Alkaline Phosphatase 44 38 - 126 U/L   Total Bilirubin 0.3 0.3 - 1.2 mg/dL   GFR calc non Af Amer >60 >60 mL/min   GFR calc Af Amer >60 >60 mL/min   Anion gap 10 5 - 15    Comment: Performed at [facility], [place] [address], [place], [place] [address]  Ethanol     Status: None   Collection Time: 08/04/19  8:04 PM  Result Value Ref Range   Alcohol, Ethyl  (B) <10 <10 mg/dL    Comment: (NOTE) Lowest detectable limit for serum alcohol is 10 mg/dL. For medical purposes only. Performed at [facility], [place] [address], [place], [place] [address]   Salicylate level     Status: None   Collection Time: 08/04/19  8:04 PM  Result Value Ref Range   Salicylate Lvl <3.1 2.8 - 30.0 mg/dL    Comment: Performed at [facility], [place] [address] [place], [place] [address]  Acetaminophen level     Status: Abnormal   Collection Time: 08/04/19  8:04 PM  Result Value Ref Range   Acetaminophen (Tylenol), Serum <10 (L) 10 - 30 ug/mL    Comment: (NOTE) Therapeutic concentrations vary significantly. A range of 10-30 ug/mL  may be an effective concentration for many patients. However, some  are best treated at concentrations outside of this range. Acetaminophen concentrations >150 ug/mL at 4 hours after ingestion  and >50 ug/mL at 12 hours after ingestion are often associated with  toxic reactions. Performed at [facility], [place] [address] [place],  [address]   cbc  Status: Abnormal   Collection Time: 08/04/19  8:04 PM  Result Value Ref Range   WBC 4.2 4.0 - 10.5 K/uL   RBC 3.79 (L) 3.87 - 5.11 MIL/uL   Hemoglobin 10.8 (L) 12.0 - 15.0 g/dL   HCT 09.8 (L) 11.9 - 14.7 %   MCV 90.8 80.0 - 100.0 fL   MCH 28.5 26.0 - 34.0 pg   MCHC 31.4 30.0 - 36.0 g/dL   RDW 82.9 56.2 - 13.0 %   Platelets 287 150 - 400 K/uL   nRBC 0.0 0.0 - 0.2 %    Comment: Performed at [facility], [address] [place], [place] [address]  Rapid urine drug screen (hospital performed)     Status: None   Collection Time: 08/04/19  8:04 PM  Result Value Ref Range   Opiates NONE DETECTED NONE DETECTED   Cocaine NONE DETECTED NONE DETECTED   Benzodiazepines NONE DETECTED NONE DETECTED   Amphetamines NONE DETECTED NONE DETECTED   Tetrahydrocannabinol NONE DETECTED NONE DETECTED   Barbiturates NONE  DETECTED NONE DETECTED    Comment: (NOTE) DRUG SCREEN FOR MEDICAL PURPOSES ONLY.  IF CONFIRMATION IS NEEDED FOR ANY PURPOSE, NOTIFY LAB WITHIN 5 DAYS. LOWEST DETECTABLE LIMITS FOR URINE DRUG SCREEN Drug Class                     Cutoff (ng/mL) Amphetamine and metabolites    1000 Barbiturate and metabolites    200 Benzodiazepine                 200 Tricyclics and metabolites     300 Opiates and metabolites        300 Cocaine and metabolites        300 THC                            50 Performed at [facility], [address], [place], [place] [address]   hCG, quantitative, pregnancy     Status: None   Collection Time: 08/04/19  8:18 PM  Result Value Ref Range   hCG, Beta Chain, Quant, S <1 <5 mIU/mL    Comment:          GEST. AGE      CONC.  (mIU/mL)   <=1 WEEK        5 - 50     2 WEEKS       50 - 500     3 WEEKS       100 - 10,000     4 WEEKS     1,000 - 30,000     5 WEEKS     3,500 - 115,000   6-8 WEEKS     12,000 - 270,000    12 WEEKS     15,000 - 220,000        FEMALE AND NON-PREGNANT FEMALE:     LESS THAN 5 mIU/mL Performed at [facility], [address] [place], [place] [address]   I-Stat beta hCG blood, ED     Status: None   Collection Time: 08/05/19  2:22 AM  Result Value Ref Range   I-stat hCG, quantitative <5.0 <5 mIU/mL   Comment 3            Comment:   GEST. AGE      CONC.  (mIU/mL)   <=1 WEEK        5 -  50     2 WEEKS       50 - 500     3 WEEKS       100 - 10,000     4 WEEKS     1,000 - 30,000        FEMALE AND NON-PREGNANT FEMALE:     LESS THAN 5 mIU/mL   SARS Coronavirus 2 [facility](Hospital order, Performed in [facility] hospital lab) Nasopharyngeal Nasopharyngeal Swab     Status: None   Collection Time: 08/05/19  4:15 AM   Specimen: Nasopharyngeal Swab  Result Value Ref Range   SARS Coronavirus 2 NEGATIVE NEGATIVE    Comment: (NOTE) If result is NEGATIVE SARS-CoV-2 target nucleic acids are NOT DETECTED. The  SARS-CoV-2 RNA is generally detectable in upper and lower  respiratory specimens during the acute phase of infection. The lowest  concentration of SARS-CoV-2 viral copies this assay can detect is 250  copies / mL. A negative result does not preclude SARS-CoV-2 infection  and should not be used as the sole basis for treatment or other  patient management decisions.  A negative result may occur with  improper specimen collection / handling, submission of specimen other  than nasopharyngeal swab, presence of viral mutation(s) within the  areas targeted by this assay, and inadequate number of viral copies  (<250 copies / mL). A negative result must be combined with clinical  observations, patient history, and epidemiological information. If result is POSITIVE SARS-CoV-2 target nucleic acids are DETECTED. The SARS-CoV-2 RNA is generally detectable in upper and lower  respiratory specimens dur ing the acute phase of infection.  Positive  results are indicative of active infection with SARS-CoV-2.  Clinical  correlation with patient history and other diagnostic information is  necessary to determine patient infection status.  Positive results do  not rule out bacterial infection or co-infection with other viruses. If result is PRESUMPTIVE POSTIVE SARS-CoV-2 nucleic acids MAY BE PRESENT.   A presumptive positive result was obtained on the submitted specimen  and confirmed on repeat testing.  While 2019 novel coronavirus  (SARS-CoV-2) nucleic acids may be present in the submitted sample  additional confirmatory testing may be necessary for epidemiological  and / or clinical management purposes  to differentiate between  SARS-CoV-2 and other Sarbecovirus currently known to infect humans.  If clinically indicated additional testing with an alternate test  methodology [phone] is advised. The SARS-CoV-2 RNA is generally  detectable in upper and lower respiratory sp ecimens during the acute   phase of infection. The expected result is Negative. Fact Sheet for Patients:  BoilerBrush.com.cyhttps://www.fda.gov/media/136312/download Fact Sheet for Healthcare Providers: https://pope.com/https://www.fda.gov/media/136313/download This test is not yet approved or cleared by the [place] FDA and has been authorized for detection and/or diagnosis of SARS-CoV-2 by FDA under an Emergency Use Authorization (EUA).  This EUA will remain in effect (meaning this test can be used) for the duration of the COVID-19 declaration under Section 564(b)(1) of the Act, 21 U.S.C. section 360bbb-3(b)(1), unless the authorization is terminated or revoked sooner. Performed at [facility], [address] [place], [place] [id number]    No results found.  Pending Labs [employer] (From admission, onward)    Start     Ordered   Signed and Held  Hemoglobin A1c  [facility] Morning draw,   R     Signed and Held   Signed and Held  Lipid panel  [facility] Morning draw,   R     Signed  and Held   Signed and Held  [facility]  [facility] Morning draw,   R     Signed and Held          Vitals/Pain Today's Vitals   08/04/19 2001 08/04/19 2006 08/05/19 0214 08/05/19 0500  BP: (!) 130/92 97/62 106/70 113/71  Pulse: 90 77 76 85  Resp: 16 18 17    Temp: 98.2 F (36.8 C) 98.7 F (37.1 C) 98.6 F (37 C)   TempSrc: Oral Oral Oral   SpO2: 99% 98% 100% 99%  PainSc:   2      Isolation Precautions No active isolations  Medications Medications  Tdap (BOOSTRIX) injection 0.5 mL (0 mLs Intramuscular Hold 08/05/19 0204)  acetaminophen (TYLENOL) tablet 650 mg (has no administration in time range)  ondansetron (ZOFRAN) tablet 4 mg (has no administration in time range)    Mobility walks Low fall risk   Focused Assessments  psych hx   R Recommendations: See Admitting Provider Note  Report given to:   Additional Notes:

## 2019-08-06 LAB — LIPID PANEL
Cholesterol: 171 mg/dL (ref 0–200)
HDL: 46 mg/dL (ref >40)
LDL Cholesterol: 90 mg/dL (ref 0–99)
Total CHOL/HDL Ratio: 3.7 RATIO
Triglycerides: 175 mg/dL — ABNORMAL HIGH (ref <150)
VLDL: 35 mg/dL (ref 0–40)

## 2019-08-06 LAB — HEMOGLOBIN A1C
Hgb A1c MFr Bld: 4.8 % (ref 4.8–5.6)
Mean Plasma Glucose: 91.06 mg/dL

## 2019-08-06 LAB — TSH: TSH: 1.329 u[IU]/mL (ref 0.350–4.500)

## 2019-08-06 MED ORDER — LORAZEPAM 1 MG PO TABS
2.0000 mg | ORAL_TABLET | Freq: Once | ORAL | Status: AC
  Administered 2019-08-06: 2 mg via ORAL
  Filled 2019-08-06: qty 2

## 2019-08-06 MED ORDER — SERTRALINE HCL 50 MG PO TABS
50.0000 mg | ORAL_TABLET | Freq: Every day | ORAL | Status: DC
  Administered 2019-08-06 – 2019-08-08 (×3): 50 mg via ORAL
  Filled 2019-08-06 (×5): qty 1

## 2019-08-06 NOTE — [facility] Counselor (Signed)
Adult Comprehensive Assessment  Patient ID: [name] [name], female   DOB: 1976-05-16, 43 y.o.   MRN: [id number]  Information Source: Information source: Patient  Current Stressors:  Patient states their primary concerns and needs for treatment are:: "Someone was after me and I cut my wrist" Patient states their goals for this hospitilization and ongoing recovery are:: "Be around people, socialize, not be so depressed, and plan what I want to do" Housing / Lack of housing: Pt reports that she stays in her own home but she does want to move.   Family Relationships: Pt reports that she lacks family support.   Living/Environment/Situation: Living Arrangements: Pt reports she lives in her own housing in [place], [place]  Living conditions (as described by patient or guardian): "It's nice" Who else lives in the home?: Alone How long has patient lived in current situation?: One month  What is atmosphere in current home: Temporary; comfortable  Family History: Current relationship? Pt reports that she did have a boyfriend but they broke up due to her coming to the hospital.    Are you sexually active?: No What is your sexual orientation?: straight Does patient have children?: Yes How many children?: 1 How is patient's relationship with their children?: 32 YO daughter living with aunt in [place] daughter's father is in [place]. Pt reports that their relationship is "better now".   Childhood History: By whom was/is the patient raised?: Pt parents split up when pt was 60, father left, mother became involved with alcohol and pt ended up in foster care from age [age], but lived with her god parents, which was OK.   Description of patient's relationship with caregiver when they were a child: good Patient's description of current relationship with people who raised him/her:  Father in [place], some contact.   mother died 15 years ago Does patient have siblings?: Yes Number of Siblings:  3 Description of patient's current relationship with siblings: 1 sister deceased, i sister in [place], one in [place]. Little contact with sisters.  Did patient suffer any verbal/emotional/physical/sexual abuse as a child?: No Did patient suffer from severe childhood neglect?: No Has patient ever been sexually abused/assaulted/raped as an adolescent or adult?: No Was the patient ever a victim of a crime or a disaster?: No Witnessed domestic violence?: No Has patient been effected by domestic violence as an adult?: No  Education: Highest grade of school patient has completed: bachelor's from [employer] Currently a student?: No(States her goal is to enroll at [employer]) Learning disability?: No  Employment/Work Situation: Employment situation: On disability Why is patient on disability: mental health How long has patient been on disability: 49 Years Patient's job has been impacted by current illness: No Did You Receive Any Psychiatric Treatment/Services While in the [employer]?: No Are There Guns or Other Weapons in [place]?: No guns reported.   Financial Resources: Financial resources: [employer], Medicaid Does patient have a Programmer, applications or guardian?: No  Alcohol/Substance Abuse: Alcohol/Substance Abuse Treatment Hx: Alcohol: pt denies, drugs: pt denies Has alcohol/substance abuse ever caused legal problems?: No  Social Support System: Patient's Community Support System: None Describe Community Support System: Pt reports that she does not have any support systems.  Type of faith/religion: N/A How does patient's faith help to cope with current illness?: N/A  Leisure/Recreation: Leisure and Hobbies: Doing hair, shopping and listening to music    Strengths/Needs:   What is the patient's perception of their strengths?: Artistic, good at cleaning. Patient states they can  use these personal strengths during their treatment to contribute to their  recovery: " I just need a stable place to live." Patient states these barriers may affect/interfere with their treatment: N/A Patient states these barriers may affect their return to the community: N/A Other important information patient would like considered in planning for their treatment: N/A  Discharge Plan:   Currently receiving community mental health services: Yes (From Whom)([facility] ACTT Team) Patient states concerns and preferences for aftercare planning are: Pt will continue with her ACTT team through [facility]. Patient states they will know when they are safe and ready for discharge when: "Friday. I think I am doing alright" Does patient have access to transportation?: No Does patient have financial barriers related to discharge medications?: No Plan for no access to transportation at discharge: Pt's case manager, bus, or Marjean Donna  Will patient be returning to same living situation after discharge?: yes; her own house    Summary/Recommendations:   Summary and Recommendations (to be completed by the evaluator): Patient is a 43 year old female who believes God is instructing her to kill herself for that God will kill her. Patient cut her wrist after stating that Jesus wanted her to. Pt's diagnosis is: Schizoaffective disorder, bipolar type (HCC). Recommendations for pt include: crisis stabilization, therapeutic milieu, medication management, attend and participate in group therapy, and development of a comprehensive mental wellness plan.  Delphia Grates. 08/06/2019

## 2019-08-06 NOTE — Tx Team (Signed)
Interdisciplinary Treatment and Diagnostic Plan Update  08/06/2019 Time of Session: 10:10am [name] [name] MRN: [id number]  Principal Diagnosis: <principal problem not specified>  Secondary Diagnoses: Active Problems:   Schizoaffective disorder, bipolar type (HCC)   Current Medications:  Current Facility-Administered Medications  Medication Dose Route Frequency Provider Last Rate Last Dose  . acetaminophen (TYLENOL) tablet 650 mg  650 mg Oral Q6H PRN Lindon Romp A, NP      . alum & mag hydroxide-simeth (MAALOX/MYLANTA) 200-200-20 MG/5ML suspension 30 mL  30 mL Oral Q4H PRN Lindon Romp A, NP      . benztropine (COGENTIN) tablet 1 mg  1 mg Oral BID [name], MD   1 mg at 08/05/19 1800  . hydrOXYzine (ATARAX/VISTARIL) tablet 25 mg  25 mg Oral TID PRN Lindon Romp A, NP      . magnesium hydroxide (MILK OF MAGNESIA) suspension 30 mL  30 mL Oral Daily PRN Lindon Romp A, NP      . risperiDONE (RISPERDAL) tablet 2 mg  2 mg Oral BID [name], MD   2 mg at 08/05/19 1800  . sertraline (ZOLOFT) tablet 50 mg  50 mg Oral Daily [name], MD      . temazepam (RESTORIL) capsule 30 mg  30 mg Oral QHS [name], MD   30 mg at 08/05/19 2113   PTA Medications: Medications Prior to Admission  Medication Sig Dispense Refill Last Dose  . OLANZapine (ZYPREXA) 10 MG tablet Take 1 tablet (10 mg total) by mouth at bedtime. (Patient not taking: Reported on 07/05/2019) 30 tablet 0   . paliperidone (INVEGA SUSTENNA) 156 MG/ML SUSY injection Inject 156 mg into the muscle once.     . potassium chloride SA (K-DUR) 20 MEQ tablet Take 1 tablet (20 mEq total) by mouth daily. (Patient not taking: Reported on 07/05/2019) 3 tablet 0     Patient Stressors:    Patient Strengths:    Treatment Modalities: Medication Management, Group therapy, Case management,  1 to 1 session with clinician, Psychoeducation, Recreational therapy.   Physician Treatment Plan for Primary Diagnosis: <principal problem not  specified> Long Term Goal(s): Improvement in symptoms so as ready for discharge Improvement in symptoms so as ready for discharge   Short Term Goals: Ability to demonstrate self-control will improve Ability to identify and develop effective coping behaviors will improve Ability to maintain clinical measurements within normal limits will improve Compliance with prescribed medications will improve Ability to identify triggers associated with substance abuse/mental health issues will improve Ability to disclose and discuss suicidal ideas Ability to demonstrate self-control will improve Ability to identify and develop effective coping behaviors will improve Compliance with prescribed medications will improve  Medication Management: Evaluate patient's response, side effects, and tolerance of medication regimen.  Therapeutic Interventions: 1 to 1 sessions, Unit Group sessions and Medication administration.  Evaluation of Outcomes: Progressing  Physician Treatment Plan for Secondary Diagnosis: Active Problems:   Schizoaffective disorder, bipolar type ([place])  Long Term Goal(s): Improvement in symptoms so as ready for discharge Improvement in symptoms so as ready for discharge   Short Term Goals: Ability to demonstrate self-control will improve Ability to identify and develop effective coping behaviors will improve Ability to maintain clinical measurements within normal limits will improve Compliance with prescribed medications will improve Ability to identify triggers associated with substance abuse/mental health issues will improve Ability to disclose and discuss suicidal ideas Ability to demonstrate self-control will improve Ability to identify and develop effective coping behaviors will improve Compliance  with prescribed medications will improve     Medication Management: Evaluate patient's response, side effects, and tolerance of medication regimen.  Therapeutic Interventions: 1 to 1  sessions, Unit Group sessions and Medication administration.  Evaluation of Outcomes: Progressing   RN Treatment Plan for Primary Diagnosis: <principal problem not specified> Long Term Goal(s): Knowledge of disease and therapeutic regimen to maintain health will improve  Short Term Goals: Ability to participate in decision making will improve, Ability to verbalize feelings will improve, Ability to disclose and discuss suicidal ideas, Ability to identify and develop effective coping behaviors will improve and Compliance with prescribed medications will improve  Medication Management: RN will administer medications as ordered by provider, will assess and evaluate patient's response and provide education to patient for prescribed medication. RN will report any adverse and/or side effects to prescribing provider.  Therapeutic Interventions: 1 on 1 counseling sessions, Psychoeducation, Medication administration, Evaluate responses to treatment, Monitor vital signs and CBGs as ordered, Perform/monitor CIWA, COWS, AIMS and Fall Risk screenings as ordered, Perform wound care treatments as ordered.  Evaluation of Outcomes: Progressing   LCSW Treatment Plan for Primary Diagnosis: <principal problem not specified> Long Term Goal(s): Safe transition to appropriate next level of care at discharge, Engage patient in therapeutic group addressing interpersonal concerns.  Short Term Goals: Engage patient in aftercare planning with referrals and resources and Increase skills for wellness and recovery  Therapeutic Interventions: Assess for all discharge needs, 1 to 1 time with Social worker, Explore available resources and support systems, Assess for adequacy in community support network, Educate family and significant other(s) on suicide prevention, Complete Psychosocial Assessment, Interpersonal group therapy.  Evaluation of Outcomes: Progressing   Progress in Treatment: Attending groups:  No. Participating in groups: No. Taking medication as prescribed: Yes. Toleration medication: Yes. Family/Significant other contact made: No, will contact:  will contact if given consent to contact Patient understands diagnosis: No. Discussing patient identified problems/goals with staff: Yes. Medical problems stabilized or resolved: Yes. Denies suicidal/homicidal ideation: Yes. Issues/concerns per patient self-inventory: No. Other:   New problem(s) identified: No, Describe:  None  New Short Term/Long Term Goal(s): Medication stabilization, elimination of SI thoughts, and development of a comprehensive mental wellness plan.   Patient Goals:  "get better and not be depressed"  Discharge Plan or Barriers: CSW will continue to follow up for appropriate referrals and possible discharge planning  Reason for Continuation of Hospitalization: Delusions  Hallucinations Medication stabilization  Estimated Length of Stay: 2-3 days   Attendees: Patient: [name] [name]  08/06/2019   Physician: Dr. Malvin Johns, MD 08/06/2019   Nursing: Marton Redwood, RN 08/06/2019  RN Care Manager: 08/06/2019   Social Worker: Stephannie Peters, LCSW 08/06/2019   Recreational Therapist:  08/06/2019   MSW Intern: Earlyne Iba  08/06/2019   Other:  08/06/2019   Other: 08/06/2019      Scribe for Treatment Team: Delphia Grates, LCSW 08/06/2019 10:55 AM

## 2019-08-06 NOTE — Progress Notes (Signed)
[facility] MD Progress Note  08/06/2019 10:36 AM [name] [name]  MRN:  [id number] Subjective:   Patient now minimizes previously expressed delusional material states she is not hearing the voice of God or Jesus today states she does not want to harm herself is eager for discharge.  Denies current thoughts of harming self or others.  No EPS or TD.  Again may be simply minimizing symptoms as she is really not had much time for the antipsychotic to reach steady state she does however request sertraline for "depression" Principal Problem: Exacerbation of underlying psychotic disorder further complaining of depressive disorder without suicidality Diagnosis: Active Problems:   Schizoaffective disorder, bipolar type ([place])  Total Time spent with patient: 20 minutes  Past Psychiatric History: see eval  Past Medical History:  Past Medical History:  Diagnosis Date  . Arthritis   . Depression    No past surgical history on file. Family History: No family history on file. Family Psychiatric  History: see eval Social History:  Social History   Substance and Sexual Activity  Alcohol Use Not Currently     Social History   Substance and Sexual Activity  Drug Use Not Currently    Social History   Socioeconomic History  . Marital status: Single    Spouse name: Not on file  . Number of children: Not on file  . Years of education: Not on file  . Highest education level: Not on file  Occupational History  . Not on file  Social Needs  . Financial resource strain: Not on file  . Food insecurity    Worry: Not on file    Inability: Not on file  . Transportation needs    Medical: Not on file    Non-medical: Not on file  Tobacco Use  . Smoking status: Never Smoker  . Smokeless tobacco: Never Used  Substance and Sexual Activity  . Alcohol use: Not Currently  . Drug use: Not Currently  . Sexual activity: Not Currently  Lifestyle  . Physical activity    Days per week: Not on file    Minutes per  session: Not on file  . Stress: Not on file  Relationships  . Social Herbalist on phone: Not on file    Gets together: Not on file    Attends religious service: Not on file    Active member of club or organization: Not on file    Attends meetings of clubs or organizations: Not on file    Relationship status: Not on file  Other Topics Concern  . Not on file  Social History Narrative  . Not on file   Additional Social History:                         Sleep: Good  Appetite:  Good  Current Medications: Current Facility-Administered Medications  Medication Dose Route Frequency Provider Last Rate Last Dose  . acetaminophen (TYLENOL) tablet 650 mg  650 mg Oral Q6H PRN Lindon Romp A, NP      . alum & mag hydroxide-simeth (MAALOX/MYLANTA) 200-200-20 MG/5ML suspension 30 mL  30 mL Oral Q4H PRN Lindon Romp A, NP      . benztropine (COGENTIN) tablet 1 mg  1 mg Oral BID [name], MD   1 mg at 08/05/19 1800  . hydrOXYzine (ATARAX/VISTARIL) tablet 25 mg  25 mg Oral TID PRN Lindon Romp A, NP      . magnesium hydroxide (MILK  OF MAGNESIA) suspension 30 mL  30 mL Oral Daily PRN Nira ConnBerry, Jason A, NP      . risperiDONE (RISPERDAL) tablet 2 mg  2 mg Oral BID Malvin JohnsFarah, Susanna Benge, MD   2 mg at 08/05/19 1800  . temazepam (RESTORIL) capsule 30 mg  30 mg Oral QHS Malvin JohnsFarah, Hasnain Manheim, MD   30 mg at 08/05/19 2113    Lab Results:  Results for orders placed or performed during the hospital encounter of 08/05/19 (from the past 48 hour(s))  Hemoglobin A1c     Status: None   Collection Time: 08/06/19  6:38 AM  Result Value Ref Range   Hgb A1c MFr Bld 4.8 4.8 - 5.6 %    Comment: (NOTE) Pre diabetes:          5.7%-6.4% Diabetes:              >6.4% Glycemic control for   <7.0% adults with diabetes    Mean Plasma Glucose 91.06 mg/dL    Comment: Performed at [facility], [address] [place], [place] [address]  Lipid panel     Status: Abnormal   Collection Time: 08/06/19  6:38 AM   Result Value Ref Range   Cholesterol 171 0 - 200 mg/dL   Triglycerides 604175 (H) <150 mg/dL   HDL 46 >54>40 mg/dL   Total CHOL/HDL Ratio 3.7 RATIO   VLDL 35 0 - 40 mg/dL   LDL Cholesterol 90 0 - 99 mg/dL    Comment:        Total Cholesterol/HDL:CHD Risk Coronary Heart Disease Risk Table                     Men   Women  1/2 Average Risk   3.4   3.3  Average Risk       5.0   4.4  2 X Average Risk   9.6   7.1  3 X Average Risk  23.4   11.0        Use the calculated Patient Ratio above and the CHD Risk Table to determine the patient's CHD Risk.        ATP III CLASSIFICATION (LDL):  <100     mg/dL   Optimal  098-119100-129  mg/dL   Near or Above                    Optimal  130-159  mg/dL   Borderline  147-829160-189  mg/dL   High  >562>190     mg/dL   Very High Performed at [facility], [address] [place], [place] [address]   TSH     Status: None   Collection Time: 08/06/19  6:38 AM  Result Value Ref Range   TSH 1.329 0.350 - 4.500 uIU/mL    Comment: Performed by a 3rd Generation assay with a functional sensitivity of <=0.01 uIU/mL. Performed at [facility], [address] [place], [place] [address]     Blood Alcohol level:  Lab Results  Component Value Date   [facility] <10 08/04/2019   ETH <10 07/14/2019    Metabolic Disorder Labs: Lab Results  Component Value Date   HGBA1C 4.8 08/06/2019   MPG 91.06 08/06/2019   MPG 88.19 06/26/2018   Lab Results  Component Value Date   PROLACTIN 28.9 (H) 06/26/2018   Lab Results  Component Value Date   CHOL 171 08/06/2019   TRIG 175 (H) 08/06/2019   HDL 46  08/06/2019   CHOLHDL 3.7 08/06/2019   VLDL 35 08/06/2019   LDLCALC 90 08/06/2019   LDLCALC 57 06/26/2018    Physical Findings: AIMS:  , ,  ,  ,    CIWA:    COWS:     Musculoskeletal: Strength & Muscle Tone: within normal limits Gait & Station: normal Patient leans: N/A  Psychiatric Specialty Exam: Physical Exam  ROS  Blood pressure  108/61, pulse 100, temperature 98 F (36.7 C), temperature source Oral, resp. rate 16, height 4\' 10"  (1.473 m), weight 49.4 kg, SpO2 100 %.Body mass index is 22.78 kg/m.  General Appearance: Casual  Eye Contact:  Fair  Speech:  Slow  Volume:  Decreased  Mood:  Anxious and Depressed  Affect:  Appropriate and Congruent  Thought Process:  Goal Directed and Descriptions of Associations: Circumstantial  Orientation:  Full (Time, Place, and Person)  Thought Content:  Illogical, Delusions and Rumination  Suicidal Thoughts:  No  Homicidal Thoughts:  No  Memory:  Immediate;   Fair Recent;   Fair Remote;   Fair  Judgement:  Fair  Insight:  Fair  Psychomotor Activity:  Decreased  Concentration:  Concentration: Fair and Attention Span: Fair  Recall:  of Knowledge:  Fair  Language:  Fair  Akathisia:  Negative  Handed:  Right  AIMS (if indicated):     Assets:  Physical Health Resilience  ADL's:  Intact  Cognition:  WNL  Sleep:  Number of Hours: 8     Treatment Plan Summary: Medication management  Continue reality based therapy cognitive-based therapy add sertraline at her request for depressive symptoms continue monitor for safety continue antipsychotic therapy will need likely long-acting injectable discuss further risk benefits and side effects/team meeting today  Christion Leonhard, MD 08/06/2019, 10:36 AM

## 2019-08-06 NOTE — Progress Notes (Signed)
D: Pt denies SI/HI/AVH. Pt is pleasant and cooperative. Pt visible on the unit this evening. Pt asked staff to help her print off information about  Sec 8 housing and academic financial aid. Information could not be printed off by staff and pt was encouraged to get SW to help get this information.  A: Pt was offered support and encouragement. Pt was given scheduled medications. Pt was encourage to attend groups. Q 15 minute checks were done for safety.  R: safety maintained on unit.

## 2019-08-06 NOTE — Progress Notes (Signed)
Adult Psychoeducational Group Note  Date:  08/06/2019 Time:  9:02 PM  Group Topic/Focus:  Wrap-Up Group:   The focus of this group is to help patients review their daily goal of treatment and discuss progress on daily workbooks.  Participation Level:  Minimal  Participation Quality:  Appropriate  Affect:  Appropriate  Cognitive:  Oriented  Insight: Improving  Engagement in Group:  Engaged  Modes of Intervention:  Education and Support  Additional Comments:  Patient attended and participated in group tonight. She reports that her day was OK but frustration. She moved from one hall to another, she watched television, and talked to her doctor.  Salley Scarlet [facility] 08/06/2019, 9:02 PM

## 2019-08-06 NOTE — Progress Notes (Signed)
Recreation Therapy Notes  Date: 9.30.20 Time: 0945 Location: [address] Day Room  Group Topic: Communication, Team Building, Problem Solving  Goal Area(s) Addresses:  Patient will effectively work with peer towards shared goal.  Patient will identify skill used to make activity successful.  Patient will identify how skills used during activity can be used to reach post d/c goals.   Behavioral Response:  Minimal  Intervention: STEM Activity   Activity: [employer].  In groups, patients were to build a free standing tower as tall as possible.  During the course of the activity, LRT would inform patients of budget cuts.  During these budget cuts, patients will have to put one hand behind their backs and not speak.  With the addition of funds, patients will be able to return to using both hands and speaking.     Education: Education officer, community, Dentist.   Education Outcome: Acknowledges education  Clinical Observations/Feedback:  Pt came in late to group.  Pt appeared to be mumbling to herself.  Pt attempted to help peers construct tower.  Pt was pleasant during group.     Victorino Sparrow, LRT/CTRS         Victorino Sparrow A 08/06/2019 10:53 AM

## 2019-08-06 NOTE — Progress Notes (Signed)
Adult Psychoeducational Group Note  Date:  08/06/2019 Time:  12:50 AM  Group Topic/Focus:  Wrap-Up Group:   The focus of this group is to help patients review their daily goal of treatment and discuss progress on daily workbooks.  Participation Level:  Active  Participation Quality:  Appropriate  Affect:  Appropriate  Cognitive:  Appropriate  Insight: Appropriate  Engagement in Group:  Developing/Improving  Modes of Intervention:  Discussion  Additional Comments:  Pt stated her goal for today was to focus on her treatment plan. Pt stated she felt she accomplished her goal today. Pt stated her relationship with her family needs to be improved. Pt stated she felt better about herself today. Pt rated her overall day an 5 out of 10. Pt stated her appetite was pretty good today. Pt stated her goal for tonight is to get some rest. Pt did not complain of any pain tonight. Pt stated she was not hearing or seeing anything that was not there. Pt stated she had no thoughts of harming herself or others. Pt stated she would alert staff if anything changes.  Candy Sledge 08/06/2019, 12:50 AM

## 2019-08-06 NOTE — BHH Suicide Risk Assessment (Signed)
[place] INPATIENT:  Family/Significant Other Suicide Prevention Education  Suicide Prevention Education:  Patient Refusal for Family/Significant Other Suicide Prevention Education: The patient [name] [name] has refused to provide written consent for family/significant other to be provided Family/Significant Other Suicide Prevention Education during admission and/or prior to discharge.  Physician notified.  Trecia Rogers 08/06/2019, 3:08 PM

## 2019-08-07 MED ORDER — RISPERIDONE 3 MG PO TABS
3.0000 mg | ORAL_TABLET | Freq: Two times a day (BID) | ORAL | Status: DC
  Administered 2019-08-07 – 2019-08-08 (×2): 3 mg via ORAL
  Filled 2019-08-07 (×6): qty 1

## 2019-08-07 MED ORDER — ILOPERIDONE 4 MG PO TABS
2.0000 mg | ORAL_TABLET | Freq: Two times a day (BID) | ORAL | Status: DC
  Administered 2019-08-07 – 2019-08-08 (×3): 2 mg via ORAL
  Filled 2019-08-07 (×7): qty 1

## 2019-08-07 NOTE — Progress Notes (Signed)
Recreation Therapy Notes  Date: 10.1.20 Time: 9562-1308 Location: [place]   Group Topic: Leisure Education  Goal Area(s) Addresses:  Patient will identify positive leisure activities.  Patient will identify one positive benefit of participation in leisure activities.   Behavioral Response: Minimal  Intervention: Leisure Group Game  Activity: Pictionary.  LRT and patients played a game of Pictionary.  One person would pick a word from the container and draw what the word represents on the board.  The remaining participants get one minute to guess what the picture is.  The person who guesses correctly gets the next turn.  Education:  Leisure Education, Dentist  Education Outcome: Acknowledges education/In group clarification offered/Needs additional education  Clinical Observations/Feedback: Pt appeared to be responding to internal stimuli.  Pt participated at times.  Pt was pleasant.   Victorino Sparrow, LRT/CTRS         Ria Comment, Kaelynne Christley A 08/07/2019 10:53 AM

## 2019-08-07 NOTE — Progress Notes (Signed)
[facility] MD Progress Note  08/07/2019 9:03 AM [name] [name]  MRN:  [id number] Subjective:    Patient continues to express that she hears God talking to her God telling her she is "short, poor, has no family" and is believes that God and/or Jesus want to kill her.  This is a chronic and longstanding delusional belief system but she cannot be talked out of it with reality based therapy she is only encouraged to continue her medication and hopefully we will have some improvement soon.  She will likely need combination antipsychotic therapy due to her treatment resistant.  She has low blood pressure but we discussed risk benefits side effects of Fanapt  No EPS or TD Principal Problem: Chronic schizophrenic disorder Diagnosis: Active Problems:   Schizoaffective disorder, bipolar type ([place])  Total Time spent with patient: 20 minutes  Past Medical History:  Past Medical History:  Diagnosis Date  . Arthritis   . Depression    No past surgical history on file. Family History: No family history on file. Family Psychiatric  History: no new Social History:  Social History   Substance and Sexual Activity  Alcohol Use Not Currently     Social History   Substance and Sexual Activity  Drug Use Not Currently    Social History   Socioeconomic History  . Marital status: Single    Spouse name: Not on file  . Number of children: Not on file  . Years of education: Not on file  . Highest education level: Not on file  Occupational History  . Not on file  Social Needs  . Financial resource strain: Not on file  . Food insecurity    Worry: Not on file    Inability: Not on file  . Transportation needs    Medical: Not on file    Non-medical: Not on file  Tobacco Use  . Smoking status: Never Smoker  . Smokeless tobacco: Never Used  Substance and Sexual Activity  . Alcohol use: Not Currently  . Drug use: Not Currently  . Sexual activity: Not Currently  Lifestyle  . Physical activity    Days  per week: Not on file    Minutes per session: Not on file  . Stress: Not on file  Relationships  . Social Herbalist on phone: Not on file    Gets together: Not on file    Attends religious service: Not on file    Active member of club or organization: Not on file    Attends meetings of clubs or organizations: Not on file    Relationship status: Not on file  Other Topics Concern  . Not on file  Social History Narrative  . Not on file   Additional Social History:                         Sleep: Fair  Appetite:  Fair  Current Medications: Current Facility-Administered Medications  Medication Dose Route Frequency Provider Last Rate Last Dose  . acetaminophen (TYLENOL) tablet 650 mg  650 mg Oral Q6H PRN Lindon Romp A, NP      . alum & mag hydroxide-simeth (MAALOX/MYLANTA) 200-200-20 MG/5ML suspension 30 mL  30 mL Oral Q4H PRN Lindon Romp A, NP      . benztropine (COGENTIN) tablet 1 mg  1 mg Oral BID [name], MD   1 mg at 08/07/19 0808  . hydrOXYzine (ATARAX/VISTARIL) tablet 25 mg  25 mg Oral  TID PRN Jackelyn Poling, NP      . iloperidone (FANAPT) tablet 2 mg  2 mg Oral BID Malvin Johns, MD      . magnesium hydroxide (MILK OF MAGNESIA) suspension 30 mL  30 mL Oral Daily PRN [name] A, NP      . risperiDONE (RISPERDAL) tablet 3 mg  3 mg Oral BID Malvin Johns, MD      . sertraline (ZOLOFT) tablet 50 mg  50 mg Oral Daily Malvin Johns, MD   50 mg at 08/07/19 9892  . temazepam (RESTORIL) capsule 30 mg  30 mg Oral QHS Malvin Johns, MD   30 mg at 08/05/19 2113    Lab Results:  Results for orders placed or performed during the hospital encounter of 08/05/19 (from the past 48 hour(s))  Hemoglobin A1c     Status: None   Collection Time: 08/06/19  6:38 AM  Result Value Ref Range   Hgb A1c MFr Bld 4.8 4.8 - 5.6 %    Comment: (NOTE) Pre diabetes:          5.7%-6.4% Diabetes:              >6.4% Glycemic control for   <7.0% adults with diabetes    Mean  Plasma Glucose 91.06 mg/dL    Comment: Performed at [facility], [address] [place], [place] [address]  Lipid panel     Status: Abnormal   Collection Time: 08/06/19  6:38 AM  Result Value Ref Range   Cholesterol 171 0 - 200 mg/dL   Triglycerides 740 (H) <150 mg/dL   HDL 46 >81 mg/dL   Total CHOL/HDL Ratio 3.7 RATIO   VLDL 35 0 - 40 mg/dL   LDL Cholesterol 90 0 - 99 mg/dL    Comment:        Total Cholesterol/HDL:CHD Risk Coronary Heart Disease Risk Table                     Men   Women  1/2 Average Risk   3.4   3.3  Average Risk       5.0   4.4  2 X Average Risk   9.6   7.1  3 X Average Risk  23.4   11.0        Use the calculated Patient Ratio above and the CHD Risk Table to determine the patient's CHD Risk.        ATP III CLASSIFICATION (LDL):  <100     mg/dL   Optimal  448-185  mg/dL   Near or Above                    Optimal  130-159  mg/dL   Borderline  631-497  mg/dL   High  >026     mg/dL   Very High Performed at [facility], [address], [place], [place] [address]   TSH     Status: None   Collection Time: 08/06/19  6:38 AM  Result Value Ref Range   TSH 1.329 0.350 - 4.500 uIU/mL    Comment: Performed by a 3rd Generation assay with a functional sensitivity of <=0.01 uIU/mL. Performed at [facility], [address], [place], [place] [address]     Blood Alcohol level:  Lab Results  Component Value Date   [facility] <10 08/04/2019   ETH <10 07/14/2019    Metabolic Disorder Labs: Lab Results  Component Value  Date   HGBA1C 4.8 08/06/2019   MPG 91.06 08/06/2019   MPG 88.19 06/26/2018   Lab Results  Component Value Date   PROLACTIN 28.9 (H) 06/26/2018   Lab Results  Component Value Date   CHOL 171 08/06/2019   TRIG 175 (H) 08/06/2019   HDL 46 08/06/2019   CHOLHDL 3.7 08/06/2019   VLDL 35 08/06/2019   LDLCALC 90 08/06/2019   LDLCALC 57 06/26/2018    Musculoskeletal: Strength & Muscle Tone: within  normal limits Gait & Station: normal Patient leans: N/A  Psychiatric Specialty Exam: Physical Exam  ROS  Blood pressure 106/72, pulse 82, temperature 98 F (36.7 C), temperature source Oral, resp. rate 16, height 4\' 10"  (1.473 m), weight 49.4 kg, SpO2 100 %.Body mass index is 22.78 kg/m.  General Appearance: Casual  Eye Contact:  Good  Speech:  Clear and Coherent  Volume:  Decreased  Mood:  Dysphoric  Affect:  Blunt  Thought Process:  Linear and Descriptions of Associations: Circumstantial  Orientation:  Full (Time, Place, and Person)  Thought Content:  Illogical, Delusions, Obsessions, Paranoid Ideation, Rumination and Tangential  Suicidal Thoughts:  No  Homicidal Thoughts:  No  Memory:  Immediate;   Fair Recent;   Fair Remote;   Fair  Judgement:  Poor  Insight:  Shallow  Psychomotor Activity:  Normal  Concentration:  Concentration: Fair and Attention Span: Fair  Recall:  [employer]  Fund of Knowledge:  Fair  Language:  Fair  Akathisia:  Negative  Handed:  Right  AIMS (if indicated):     Assets:  Communication Skills Leisure Time Physical Health Resilience Social Support  ADL's:  Intact  Cognition:  WNL  Sleep:  Number of Hours: 8     Treatment Plan Summary: Daily contact with patient to assess and evaluate symptoms and progress in treatment and Medication management  Continue current cognitive therapy reality based therapy add low-dose haloperidol and monitor blood pressure no change in precautions again continue reality based therapy and hopefully she will show some improvement within the next few days  Eldridge Marcott, MD 08/07/2019, 9:03 AM

## 2019-08-07 NOTE — Progress Notes (Signed)
DAR NOTE: Patient presents with anxious affect and depressed mood.  Patient is observed talking to her self.  Denies suicidal thoughts.  Rates depression at 3, hopelessness at 3, and anxiety at 2.  Maintained on routine safety checks.  Medications given as prescribed.  Support and encouragement offered as needed.  Attended group and participated.  States goal for today is "to be more social and participate in activities."  Patient observed socializing with peers in the dayroom.  Patient is safe on and off the unit.  Offered no complaint.

## 2019-08-07 NOTE — Progress Notes (Signed)
Per patient request, patient was provided shelter resources including [facility] in [place]. Patient voiced appreciation, observed making phone calls with list in hand.  Stephanie Acre, LCSW-A Clinical Social Worker

## 2019-08-07 NOTE — BHH Group Notes (Signed)
[facility] LCSW Group Therapy Note   Date and Time: 08/07/2019 @ 1:30pm  Type of Group and Topic: Psychoeducational Group: Discharge Planning  Participation Level: BHH PARTICIPATION LEVEL: Active  Mood: Plesant   Description of Group: Discharge planning group reviews patient's anticipated discharge plans and assists patients to anticipate and address any barriers to wellness/recovery in the community. Suicide prevention education is reviewed with patients in group. Therapeutic Goals 1. Patients will state their anticipated discharge plan and mental health aftercare 2. Patients will identify potential barriers to wellness in the community setting 3. Patients will engage in problem solving, solution focused discussion of ways to anticipate and address barriers to wellness/recovery     Summary of Patient Progress:   Patient was active and engaged throughout group therapy. Patient was able to identify how she would assist herself in different scenarios that could possibly result in the patient wanting to come back to the hospital and instead figure out how to cope with the scenarios. Patient identified family as a barrier to her wellness out in the community setting. She states that she is going to utilize her counselor when these issues/barriers arise. Patient identified finances as a barrier to her wellness out in the community setting. She states that connecting with her case manager is what she is going to utilize when these issues/barriers arise.    Plan for Discharge/Comments:    Transportation Means: Case manager/Public Transportation    Supports: Case manager      Therapeutic Modalities: Motivational Interviewing

## 2019-08-07 NOTE — BH Assessment (Signed)
D: Pt denies SI/HI/VH, +ve Ah . Pt continues to be guarded, pt keeps to herself even when sitting in the dayroom.  A: Pt was offered support and encouragement. Pt was given scheduled medications. Pt was encourage to attend groups. Q 15 minute checks were done for safety.  R: safety maintained on unit.

## 2019-08-08 MED ORDER — TEMAZEPAM 30 MG PO CAPS: 30.0000 mg | ORAL_CAPSULE | Freq: Every day | ORAL | 1 refills | Status: DC

## 2019-08-08 MED ORDER — ILOPERIDONE 4 MG PO TABS: 2.0000 mg | ORAL_TABLET | Freq: Two times a day (BID) | ORAL | 2 refills | Status: DC

## 2019-08-08 MED ORDER — BENZTROPINE MESYLATE 1 MG PO TABS: 1.0000 mg | ORAL_TABLET | Freq: Two times a day (BID) | ORAL | 3 refills | Status: DC

## 2019-08-08 MED ORDER — SERTRALINE HCL 50 MG PO TABS: 50.0000 mg | ORAL_TABLET | Freq: Every day | ORAL | 1 refills | Status: DC

## 2019-08-08 MED ORDER — RISPERIDONE 3 MG PO TABS: 6.0000 mg | ORAL_TABLET | Freq: Every day | ORAL | 2 refills | Status: DC

## 2019-08-08 NOTE — Progress Notes (Signed)
  [facility] Adult Case Management Discharge Plan :  Will you be returning to the same living situation after discharge:  Yes,  home  At discharge, do you have transportation home?: Yes,  provided lyft Do you have the ability to pay for your medications: Yes,  medicare   Release of information consent forms completed and in the chart;  Patient's signature needed at discharge.  Patient to Follow up at: Follow-up [facility] Follow up.   Contact information: [place] [address] [phone]           Next level of care provider has access to [place] and Suicide Prevention discussed: Yes,  with pt   Have you used any form of tobacco in the last 30 days? (Cigarettes, Smokeless Tobacco, Cigars, and/or Pipes): No  Has patient been referred to the Quitline?: N/A patient is not a smoker  Patient has been referred for addiction treatment: Yes  Billey Chang, Student-Social Work 08/08/2019, 9:15 AM
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
## 2019-08-08 NOTE — Progress Notes (Signed)
Recreation Therapy Notes  Date: 10.2.20 Time: 1000 Location: 400 Hall Dayroom   Group Topic: Communication, Team Building, Problem Solving  Goal Area(s) Addresses:  Patient will effectively work with peer towards shared goal.  Patient will identify skill used to make activity successful.  Patient will identify how skills used during activity can be used to reach post d/c goals.   Behavioral Response:  Engaged  Intervention: STEM Activity   Activity: [employer]. Patients were provided the following materials: 5 drinking straws, 5 rubber bands, 5 paper clips, 2 index cards, and 2 drinking cups.  Using the provided materials patients were asked to build [name] launching mechanisms to launch [name] ping pong ball approximately 12 feet. Patients were divided into teams of 3-5.   Education: Education officer, community, Dentist.   Education Outcome: Acknowledges education/In group clarification offered/Needs additional education.   Clinical Observations/Feedback: Pt was more of the leader in her group.  Pt worked to come up with [name] concept and put it together.  Pt could be heard mumbling to herself throughout group session.  Pt was pleasant and engaged.    [name] [name], LRT/CTRS      [name] [name], [name] [name] 08/08/2019 11:03 AM

## 2019-08-08 NOTE — Progress Notes (Signed)
Patient ID: [name] [name], female   DOB: [birth year], 43 y.o.   MRN: [id number] Patient discharged to home/self care via lyft.  Patient denies SI, HI and AVH upon discharge. Patient acknowledged understanding of all discharge instructions.  Continue to monitor as planned.

## 2019-08-08 NOTE — Plan of Care (Signed)
Pt was able to exhibit some appropriate behavior during group sessions.     Victorino Sparrow, LRT/CTRS

## 2019-08-08 NOTE — BHH Suicide Risk Assessment (Signed)
[facility] Discharge Suicide Risk Assessment   Principal Problem: Exacerbation of schizophrenia Discharge Diagnoses: Active Problems:   Schizoaffective disorder, bipolar type [facility])  Musculoskeletal: Strength & Muscle Tone: within normal limits Gait & Station: normal Patient leans: N/A  Psychiatric Specialty Exam: Physical Exam  ROS  Blood pressure 123/79, pulse (!) 107, temperature 97.6 F (36.4 C), resp. rate 16, height 4\' 10"  (1.473 m), weight 49.4 kg, SpO2 100 %.Body mass index is 22.78 kg/m.  General Appearance: Casual  Eye Contact:  Fair  Speech:  Clear and Coherent  Volume:  Decreased  Mood:  Euthymic  Affect:  Constricted  Thought Process:  Coherent and Linear  Orientation:  Full (Time, Place, and Person)  Thought Content:  Logical  Suicidal Thoughts:  No  Homicidal Thoughts:  No  Memory:  Immediate;   Good Recent;   Good Remote;   Good  Judgement:  Good  Insight:  Good  Psychomotor Activity:  Normal  Concentration:  Concentration: Good and Attention Span: Good  Recall:  Good  Fund of Knowledge:  Good  Language:  Good  Akathisia:  Negative  Handed:  Right  AIMS (if indicated):     Assets:  Communication Skills Desire for Improvement Housing Leisure Time Physical Health  ADL's:  Intact  Cognition:  WNL  Sleep:  Number of Hours: 6.75       Total Time spent with patient: 45 minutes   Mental Status Per Nursing Assessment::   On Admission:     Demographic Factors:  Low socioeconomic status and Unemployed  Loss Factors: Decrease in vocational status  Historical Factors: NA  Risk Reduction Factors:   Sense of responsibility to family and Religious beliefs about death  Continued Clinical Symptoms:  Schizophrenia:   Less than 63 years old  Cognitive Features That Contribute To Risk:  Loss of executive function    Suicide Risk:  Minimal: No identifiable suicidal ideation.  Patients presenting with no risk factors but with morbid ruminations; may  be classified as minimal risk based on the severity of the depressive symptoms  Follow-up [place] Follow up.   Why: Your ACTT services will continue after you discharge. Your ACTT team will contact you before arriving to you.  Contact information: [address] [place] [address] [phone]           Plan Of Care/Follow-up recommendations:  Activity:  full  Fatiha Guzy, MD 08/08/2019, 9:50 AM

## 2019-08-08 NOTE — Progress Notes (Signed)
Recreation Therapy Notes  INPATIENT RECREATION TR PLAN  Patient Details Name: [name] [name] MRN: [id number] DOB: 01-16-1976 Today's Date: 08/08/2019  Rec Therapy Plan Is patient appropriate for Therapeutic Recreation?: Yes Treatment times per week: about 3 days Estimated Length of Stay: 5-7 days TR Treatment/Interventions: Group participation (Comment)  Discharge Criteria Pt will be discharged from therapy if:: Discharged Treatment plan/goals/alternatives discussed and agreed upon by:: Patient/family  Discharge Summary Short term goals set: See patient care plan Short term goals met: Adequate for discharge Progress toward goals comments: Groups attended Which groups?: Leisure education, Coping skills, Other (Comment)(Team building) Reason goals not met: None Therapeutic equipment acquired: N/A Reason patient discharged from therapy: Discharge from hospital Pt/family agrees with progress & goals achieved: Yes Date patient discharged from therapy: 08/08/19    Victorino Sparrow, LRT/CTRS  Ria Comment, [place] 08/08/2019, 11:20 AM

## 2019-08-19 ENCOUNTER — Emergency Department (HOSPITAL_COMMUNITY)
Admission: EM | Admit: 2019-08-19 | Discharge: 2019-08-20 | Disposition: A | Discharge status: Home / Self Care | Payer: Medicare Other | Attending: Emergency Medicine | Admitting: Emergency Medicine

## 2019-08-19 ENCOUNTER — Inpatient Hospital Stay (HOSPITAL_COMMUNITY): Admission: AD | Admit: 2019-08-19 | Payer: Medicare Other | Source: Intra-hospital | Admitting: Psychiatry

## 2019-08-19 DIAGNOSIS — Z79899 Other long term (current) drug therapy: Secondary | ICD-10-CM | POA: Diagnosis not present

## 2019-08-19 DIAGNOSIS — R440 Auditory hallucinations: Secondary | ICD-10-CM | POA: Diagnosis not present

## 2019-08-19 DIAGNOSIS — F329 Major depressive disorder, single episode, unspecified: Secondary | ICD-10-CM | POA: Insufficient documentation

## 2019-08-19 DIAGNOSIS — F250 Schizoaffective disorder, bipolar type: Secondary | ICD-10-CM | POA: Diagnosis not present

## 2019-08-19 DIAGNOSIS — F209 Schizophrenia, unspecified: Secondary | ICD-10-CM | POA: Diagnosis not present

## 2019-08-19 DIAGNOSIS — Z046 Encounter for general psychiatric examination, requested by authority: Secondary | ICD-10-CM | POA: Insufficient documentation

## 2019-08-19 DIAGNOSIS — R45851 Suicidal ideations: Secondary | ICD-10-CM | POA: Diagnosis not present

## 2019-08-19 DIAGNOSIS — Z20828 Contact with and (suspected) exposure to other viral communicable diseases: Secondary | ICD-10-CM | POA: Insufficient documentation

## 2019-08-19 DIAGNOSIS — Z03818 Encounter for observation for suspected exposure to other biological agents ruled out: Secondary | ICD-10-CM | POA: Diagnosis not present

## 2019-08-19 LAB — CBC WITH DIFFERENTIAL/PLATELET
Abs Immature Granulocytes: 0 10*3/uL (ref 0.00–0.07)
Basophils Absolute: 0 10*3/uL (ref 0.0–0.1)
Basophils Relative: 0 %
Eosinophils Absolute: 0.1 10*3/uL (ref 0.0–0.5)
Eosinophils Relative: 1 %
HCT: 33.2 % — ABNORMAL LOW (ref 36.0–46.0)
Hemoglobin: 10.6 g/dL — ABNORMAL LOW (ref 12.0–15.0)
Immature Granulocytes: 0 %
Lymphocytes Relative: 35 %
Lymphs Abs: 1.6 10*3/uL (ref 0.7–4.0)
MCH: 28.4 pg (ref 26.0–34.0)
MCHC: 31.9 g/dL (ref 30.0–36.0)
MCV: 89 fL (ref 80.0–100.0)
Monocytes Absolute: 0.4 10*3/uL (ref 0.1–1.0)
Monocytes Relative: 9 %
Neutro Abs: 2.6 10*3/uL (ref 1.7–7.7)
Neutrophils Relative %: 55 %
Platelets: 303 10*3/uL (ref 150–400)
RBC: 3.73 MIL/uL — ABNORMAL LOW (ref 3.87–5.11)
RDW: 13.7 % (ref 11.5–15.5)
WBC: 4.6 10*3/uL (ref 4.0–10.5)
nRBC: 0 % (ref 0.0–0.2)

## 2019-08-19 LAB — RAPID URINE DRUG SCREEN, HOSP PERFORMED
Amphetamines: NOT DETECTED
Barbiturates: NOT DETECTED
Benzodiazepines: POSITIVE — AB
Cocaine: NOT DETECTED
Opiates: NOT DETECTED
Tetrahydrocannabinol: NOT DETECTED

## 2019-08-19 LAB — ETHANOL: Alcohol, Ethyl (B): <10 mg/dL (ref <10)

## 2019-08-19 LAB — COMPREHENSIVE METABOLIC PANEL
ALT: 12 U/L (ref 0–44)
AST: 21 U/L (ref 15–41)
Albumin: 4 g/dL (ref 3.5–5.0)
Alkaline Phosphatase: 43 U/L (ref 38–126)
Anion gap: 9 (ref 5–15)
BUN: 8 mg/dL (ref 6–20)
CO2: 25 mmol/L (ref 22–32)
Calcium: 9.2 mg/dL (ref 8.9–10.3)
Chloride: 104 mmol/L (ref 98–111)
Creatinine, Ser: 0.5 mg/dL (ref 0.44–1.00)
GFR calc Af Amer: >60 mL/min (ref >60)
GFR calc non Af Amer: >60 mL/min (ref >60)
Glucose, Bld: 76 mg/dL (ref 70–99)
Potassium: 3.3 mmol/L — ABNORMAL LOW (ref 3.5–5.1)
Sodium: 138 mmol/L (ref 135–145)
Total Bilirubin: 0.8 mg/dL (ref 0.3–1.2)
Total Protein: 7.1 g/dL (ref 6.5–8.1)

## 2019-08-19 MED ORDER — SERTRALINE HCL 50 MG PO TABS
50.0000 mg | ORAL_TABLET | Freq: Every day | ORAL | Status: DC
  Administered 2019-08-19 – 2019-08-20 (×2): 50 mg via ORAL
  Filled 2019-08-19 (×2): qty 1

## 2019-08-19 MED ORDER — TEMAZEPAM 15 MG PO CAPS
30.0000 mg | ORAL_CAPSULE | Freq: Every day | ORAL | Status: DC
  Administered 2019-08-19: 22:00:00 30 mg via ORAL
  Filled 2019-08-19: qty 2

## 2019-08-19 MED ORDER — ILOPERIDONE 4 MG PO TABS
2.0000 mg | ORAL_TABLET | Freq: Two times a day (BID) | ORAL | Status: DC
  Administered 2019-08-19 – 2019-08-20 (×2): 2 mg via ORAL
  Filled 2019-08-19 (×3): qty 1

## 2019-08-19 MED ORDER — BENZTROPINE MESYLATE 1 MG PO TABS
1.0000 mg | ORAL_TABLET | Freq: Two times a day (BID) | ORAL | Status: DC
  Administered 2019-08-19 – 2019-08-20 (×2): 1 mg via ORAL
  Filled 2019-08-19 (×2): qty 1

## 2019-08-19 MED ORDER — RISPERIDONE 2 MG PO TABS
6.0000 mg | ORAL_TABLET | Freq: Every day | ORAL | Status: DC
  Administered 2019-08-19: 22:00:00 6 mg via ORAL
  Filled 2019-08-19 (×2): qty 3
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
## 2019-08-19 NOTE — ED Triage Notes (Signed)
Per EMS, this patient was at the bus stop talking about God telling her that someone is trying to kill her.  She was yelling and screaming in the ambulance and refusing to let them touch her.  They were unable to get her vitals.  Upon arrival she appears to be responding to internal stimuli and she is being uncooperative about dressing out and letting us get vitals.

## 2019-08-19 NOTE — ED Provider Notes (Signed)
[facility]-EMERGENCY DEPT Provider Note   CSN: [id number] Arrival date & time: 08/19/19  1712     History   Chief Complaint Chief Complaint  Patient presents with  . Suicidal    HPI [name] [name] is a 43 y.o. female.     HPI Patient has a history of homelessness and schizoaffective disorder.  According to EMS report, the patient was on a bus stop talking about God telling her that someone was trying to kill her.  When EMS arrived the patient was yelling and screaming and refused to let anyone touch her.  Patient herself states she is having issues with depression.  Patient states she is feeling suicidal.  She was in the hospital recently but states she has not been taking the medication since discharge.  According to the medical records the patient was admitted to behavioral health hospital on September 29 and was discharged on October 2. Past Medical History:  Diagnosis Date  . Arthritis   . Depression     Patient Active Problem List   Diagnosis Date Noted  . Homelessness 07/06/2019  . Adjustment disorder with disturbance of emotion 07/06/2019  . Hallucinations   . Schizoaffective disorder, bipolar type (HCC) 09/01/2018    No past surgical history on file.   OB History   No obstetric history on file.      Home Medications    Prior to Admission medications   Medication Sig Start Date End Date Taking? Authorizing Provider  benztropine (COGENTIN) 1 MG tablet Take 1 tablet (1 mg total) by mouth 2 (two) times daily. 08/08/19   Malvin Johns, MD  iloperidone (FANAPT) 4 MG TABS tablet Take 0.5 tablets (2 mg total) by mouth 2 (two) times daily. 08/08/19   Malvin Johns, MD  risperiDONE (RISPERDAL) 3 MG tablet Take 2 tablets (6 mg total) by mouth at bedtime. 08/08/19   Malvin Johns, MD  sertraline (ZOLOFT) 50 MG tablet Take 1 tablet (50 mg total) by mouth daily. 08/09/19   Malvin Johns, MD  temazepam (RESTORIL) 30 MG capsule Take 1 capsule (30 mg total) by  mouth at bedtime. 08/08/19   Malvin Johns, MD    Family History No family history on file.  Social History Social History   Tobacco Use  . Smoking status: Never Smoker  . Smokeless tobacco: Never Used  Substance Use Topics  . Alcohol use: Not Currently  . Drug use: Not Currently     Allergies   Patient has no known allergies.   Review of Systems Review of Systems  All other systems reviewed and are negative.    Physical Exam Updated Vital Signs BP 107/72 (BP Location: Left Arm)   Pulse 82   Temp 98.7 F (37.1 C) (Oral)   Resp 16   SpO2 100%   Physical Exam Vitals signs and nursing note reviewed.  Constitutional:      General: She is not in acute distress.    Appearance: She is well-developed.  HENT:     Head: Normocephalic and atraumatic.     Right Ear: External ear normal.     Left Ear: External ear normal.  Eyes:     General: No scleral icterus.       Right eye: No discharge.        Left eye: No discharge.     Conjunctiva/sclera: Conjunctivae normal.  Neck:     Musculoskeletal: Neck supple.     Trachea: No tracheal deviation.  Cardiovascular:  Rate and Rhythm: Normal rate and regular rhythm.  Pulmonary:     Effort: Pulmonary effort is normal. No respiratory distress.     Breath sounds: Normal breath sounds. No stridor. No wheezing or rales.  Abdominal:     General: Bowel sounds are normal. There is no distension.     Palpations: Abdomen is soft.     Tenderness: There is no abdominal tenderness. There is no guarding or rebound.  Musculoskeletal:        General: No tenderness.  Skin:    General: Skin is warm and dry.     Findings: No rash.  Neurological:     Mental Status: She is alert.     Cranial Nerves: No cranial nerve deficit (no facial droop, extraocular movements intact, no slurred speech).     Sensory: No sensory deficit.     Motor: No abnormal muscle tone or seizure activity.     Coordination: Coordination normal.  Psychiatric:         Attention and Perception: She perceives auditory hallucinations.        Mood and Affect: Affect is labile and blunt.        Speech: Speech is delayed.        Behavior: Behavior is withdrawn.        Thought Content: Thought content includes suicidal ideation.      ED Treatments / Results  Labs (all labs ordered are listed, but only abnormal results are displayed) Labs Reviewed  COMPREHENSIVE METABOLIC PANEL - Abnormal; Notable for the following components:      Result Value   Potassium 3.3 (*)    All other components within normal limits  RAPID URINE DRUG SCREEN, HOSP PERFORMED - Abnormal; Notable for the following components:   Benzodiazepines POSITIVE (*)    All other components within normal limits  CBC WITH DIFFERENTIAL/PLATELET - Abnormal; Notable for the following components:   RBC 3.73 (*)    Hemoglobin 10.6 (*)    HCT 33.2 (*)    All other components within normal limits  SARS CORONAVIRUS 2 (TAT 6-24 HRS)  ETHANOL  HCG, QUANTITATIVE, PREGNANCY    EKG None  Radiology No results found.  Procedures Procedures (including critical care time)  Medications Ordered in ED Medications  benztropine (COGENTIN) tablet 1 mg (has no administration in time range)  iloperidone (FANAPT) tablet 2 mg (has no administration in time range)  risperiDONE (RISPERDAL) tablet 6 mg (has no administration in time range)  sertraline (ZOLOFT) tablet 50 mg (50 mg Oral Given 08/19/19 2055)  temazepam (RESTORIL) capsule 30 mg (has no administration in time range)     Initial Impression / Assessment and Plan / ED Course  I have reviewed the triage vital signs and the nursing notes.  Pertinent labs & imaging results that were available during my care of the patient were reviewed by me and considered in my medical decision making (see chart for details).   Patient presented with recurrent suicidal ideation and delusional behavior.  Laboratory tests are unremarkable.  Patient is medically  cleared for psychiatric evaluation.  Patient was evaluated by the psychiatric team and the plan is for inpatient psychiatric hospitalization.  Final Clinical Impressions(s) / ED Diagnoses   Final diagnoses:  Suicidal ideation    ED Discharge Orders    None       Dorie Rank, MD 08/19/19 2217

## 2019-08-19 NOTE — Progress Notes (Signed)
Received [name] [name] at the change of shift awake in bed with the sitter at the bedside. She denied hearing voices at the present time. She received a snack and later was compliant with her medications. She slept throughout the night.

## 2019-08-19 NOTE — ED Notes (Signed)
Per Larose Kells, patient accepted  [facility] Adult Unit Room [place] Attending is Dr. Mallie Darting. Transport after resulted COVID test Report [phone] Mechele Claude, RN, informed of acceptance.

## 2019-08-20 ENCOUNTER — Encounter (HOSPITAL_COMMUNITY): Payer: Self-pay | Admitting: Registered Nurse

## 2019-08-20 ENCOUNTER — Emergency Department (HOSPITAL_COMMUNITY)
Admission: EM | Admit: 2019-08-20 | Discharge: 2019-08-21 | Disposition: A | Discharge status: Home / Self Care | Payer: Medicare Other | Source: Home / Self Care | Attending: Emergency Medicine | Admitting: Emergency Medicine

## 2019-08-20 ENCOUNTER — Encounter (HOSPITAL_COMMUNITY): Payer: Self-pay | Admitting: *Deleted

## 2019-08-20 ENCOUNTER — Other Ambulatory Visit: Payer: Self-pay

## 2019-08-20 DIAGNOSIS — Z590 Homelessness: Secondary | ICD-10-CM | POA: Insufficient documentation

## 2019-08-20 DIAGNOSIS — F259 Schizoaffective disorder, unspecified: Secondary | ICD-10-CM

## 2019-08-20 DIAGNOSIS — Z03818 Encounter for observation for suspected exposure to other biological agents ruled out: Secondary | ICD-10-CM | POA: Diagnosis not present

## 2019-08-20 DIAGNOSIS — F329 Major depressive disorder, single episode, unspecified: Secondary | ICD-10-CM | POA: Diagnosis not present

## 2019-08-20 DIAGNOSIS — F209 Schizophrenia, unspecified: Secondary | ICD-10-CM | POA: Diagnosis not present

## 2019-08-20 DIAGNOSIS — F251 Schizoaffective disorder, depressive type: Secondary | ICD-10-CM | POA: Insufficient documentation

## 2019-08-20 DIAGNOSIS — Z20828 Contact with and (suspected) exposure to other viral communicable diseases: Secondary | ICD-10-CM | POA: Insufficient documentation

## 2019-08-20 LAB — COMPREHENSIVE METABOLIC PANEL
ALT: 14 U/L (ref 0–44)
AST: 19 U/L (ref 15–41)
Albumin: 4.3 g/dL (ref 3.5–5.0)
Alkaline Phosphatase: 45 U/L (ref 38–126)
Anion gap: 10 (ref 5–15)
BUN: 11 mg/dL (ref 6–20)
CO2: 24 mmol/L (ref 22–32)
Calcium: 9.3 mg/dL (ref 8.9–10.3)
Chloride: 103 mmol/L (ref 98–111)
Creatinine, Ser: 0.55 mg/dL (ref 0.44–1.00)
GFR calc Af Amer: >60 mL/min (ref >60)
GFR calc non Af Amer: >60 mL/min (ref >60)
Glucose, Bld: 96 mg/dL (ref 70–99)
Potassium: 3.3 mmol/L — ABNORMAL LOW (ref 3.5–5.1)
Sodium: 137 mmol/L (ref 135–145)
Total Bilirubin: 0.5 mg/dL (ref 0.3–1.2)
Total Protein: 7.3 g/dL (ref 6.5–8.1)

## 2019-08-20 LAB — SARS CORONAVIRUS 2 (TAT 6-24 HRS): SARS Coronavirus 2: NEGATIVE

## 2019-08-20 LAB — CBC
HCT: 36.6 % (ref 36.0–46.0)
Hemoglobin: 11.5 g/dL — ABNORMAL LOW (ref 12.0–15.0)
MCH: 28.3 pg (ref 26.0–34.0)
MCHC: 31.4 g/dL (ref 30.0–36.0)
MCV: 90.1 fL (ref 80.0–100.0)
Platelets: 318 10*3/uL (ref 150–400)
RBC: 4.06 MIL/uL (ref 3.87–5.11)
RDW: 13.6 % (ref 11.5–15.5)
WBC: 4 10*3/uL (ref 4.0–10.5)
nRBC: 0 % (ref 0.0–0.2)

## 2019-08-20 LAB — ETHANOL: Alcohol, Ethyl (B): <10 mg/dL (ref <10)

## 2019-08-20 LAB — I-STAT BETA HCG BLOOD, ED (MC, WL, AP ONLY): I-stat hCG, quantitative: <5 m[IU]/mL (ref <5)

## 2019-08-20 MED ORDER — BENZTROPINE MESYLATE 1 MG/ML IJ SOLN
1.0000 mg | Freq: Once | INTRAMUSCULAR | Status: AC
  Administered 2019-08-21: 01:00:00 1 mg via INTRAMUSCULAR
  Filled 2019-08-20: qty 2

## 2019-08-20 NOTE — Discharge Instructions (Signed)
For your behavioral health needs, you are advised to continue treatment with [facility]:       [facility]      [address] [place], [place] [address]      [phone]

## 2019-08-20 NOTE — ED Notes (Signed)
Pt calm, cooperative, guarded, quiet, pt resting comfortably in bed.

## 2019-08-20 NOTE — BH Assessment (Signed)
[facility] Assessment Progress Note  Per Hampton Abbot, MD, this pt does not require psychiatric hospitalization at this time.  Pt is to be discharged from [facility] no later than 14:00 with recommendation to continue treatment with pt's current outpatient provider.  Pt reports that this is [facility], which corresponds with report from the [facility].  This Probation officer called the provider's phone number [phone]) several times, receiving a message that the call cannot be connected at this time.  I also called a number provided by the pt [phone]) which rolls to a voice mail that is full and cannot accept messages.  Discharge instructions advise pt to follow up with [facility].  Pt's nurse, Eustaquio Maize, has been notified.  Jalene Mullet, [place] Triage Specialist [phone]

## 2019-08-20 NOTE — Consult Note (Signed)
[facility] Psych ED Discharge  08/20/2019 12:16 PM Duane Trias  MRN:  [id number] Principal Problem: Schizoaffective disorder, bipolar type [facility]) Discharge Diagnoses: Principal Problem:   Schizoaffective disorder, bipolar type (HCC)   Subjective: [name] [name], 43 y.o., female patient seen via tele psych by this provider, Dr. Lucianne Muss; and chart reviewed on 08/20/19.  On evaluation Dorthie Santini reports she came to the hospital because she was out of one of her medications and did not have the money because she is having issues with her Medicaid card.  Patient denies suicidal/self-harm/homicidal ideation, psychosis, and paranoia.  States that she has outpatient services and an ACTT team with [facility].  Patient states she was "Suppose to have a meeting yesterday for a work shop but the girl didn't pick me up."  Gave permission to call ACTT team to let know she was in hospital.  Patient states that she feels safe just wants her medication.  Patient does live a lone; states that she is not homeless. SW Janice Coffin) will contact ACTT team.   During evaluation Nakeesha Bowler is alert/oriented x 4; calm/cooperative; and mood is congruent with affect.  She does not appear to be responding to internal/external stimuli or delusional thoughts.  Patient denies suicidal/self-harm/homicidal ideation, psychosis, and paranoia.  Patient answered question appropriately.     Total Time spent with patient: 30 minutes  Past Psychiatric History: Schizoaffective disorder  Past Medical History:  Past Medical History:  Diagnosis Date  . Arthritis   . Depression    History reviewed. No pertinent surgical history. Family History: History reviewed. No pertinent family history. Family Psychiatric  History: Unaware Social History:  Social History   Substance and Sexual Activity  Alcohol Use Not Currently     Social History   Substance and Sexual Activity  Drug Use Not Currently    Social History   Socioeconomic  History  . Marital status: Single    Spouse name: Not on file  . Number of children: Not on file  . Years of education: Not on file  . Highest education level: Not on file  Occupational History  . Not on file  Social Needs  . Financial resource strain: Not on file  . Food insecurity    Worry: Not on file    Inability: Not on file  . Transportation needs    Medical: Not on file    Non-medical: Not on file  Tobacco Use  . Smoking status: Never Smoker  . Smokeless tobacco: Never Used  Substance and Sexual Activity  . Alcohol use: Not Currently  . Drug use: Not Currently  . Sexual activity: Not Currently  Lifestyle  . Physical activity    Days per week: Not on file    Minutes per session: Not on file  . Stress: Not on file  Relationships  . Social Musician on phone: Not on file    Gets together: Not on file    Attends religious service: Not on file    Active member of club or organization: Not on file    Attends meetings of clubs or organizations: Not on file    Relationship status: Not on file  Other Topics Concern  . Not on file  Social History Narrative  . Not on file    Has this patient used any form of tobacco in the last 30 days? (Cigarettes, Smokeless Tobacco, Cigars, and/or Pipes) Prescription not provided because: Patient does not use tobacco products  Current Medications: Current  Facility-Administered Medications  Medication Dose Route Frequency Provider Last Rate Last Dose  . benztropine (COGENTIN) tablet 1 mg  1 mg Oral BID Linwood DibblesKnapp, Jon, MD   1 mg at 08/20/19 [id number]  . iloperidone (FANAPT) tablet 2 mg  2 mg Oral BID Linwood DibblesKnapp, Jon, MD   2 mg at 08/20/19 [id number]  . risperiDONE (RISPERDAL) tablet 6 mg  6 mg Oral QHS Linwood DibblesKnapp, Jon, MD   6 mg at 08/19/19 2225  . sertraline (ZOLOFT) tablet 50 mg  50 mg Oral Daily Linwood DibblesKnapp, Jon, MD   50 mg at 08/20/19 [id number]  . temazepam (RESTORIL) capsule 30 mg  30 mg Oral QHS Linwood DibblesKnapp, Jon, MD   30 mg at 08/19/19 2225   Current  Outpatient Medications  Medication Sig Dispense Refill  . benztropine (COGENTIN) 1 MG tablet Take 1 tablet (1 mg total) by mouth 2 (two) times daily. 60 tablet 3  . iloperidone (FANAPT) 4 MG TABS tablet Take 0.5 tablets (2 mg total) by mouth 2 (two) times daily. 30 tablet 2  . risperiDONE (RISPERDAL) 3 MG tablet Take 2 tablets (6 mg total) by mouth at bedtime. 60 tablet 2  . sertraline (ZOLOFT) 50 MG tablet Take 1 tablet (50 mg total) by mouth daily. 90 tablet 1  . temazepam (RESTORIL) 30 MG capsule Take 1 capsule (30 mg total) by mouth at bedtime. 30 capsule 1   PTA Medications: (Not in a hospital admission)   Musculoskeletal: Strength & Muscle Tone: within normal limits Gait & Station: normal Patient leans: N/A  Psychiatric Specialty Exam: Physical Exam  Nursing note and vitals reviewed. Constitutional: She is oriented to person, place, and time. She appears well-nourished. No distress.  Neck: Normal range of motion.  Respiratory: Effort normal.  Musculoskeletal: Normal range of motion.  Neurological: She is alert and oriented to person, place, and time.  Psychiatric: Her speech is normal and behavior is normal. Judgment and thought content normal. Anxious: Stable. Cognition and memory are normal. Depressed: Stable.    Review of Systems  Psychiatric/Behavioral: Depression: Stable. Hallucinations: Denies. Memory loss: Denies. Substance abuse: Denies. Suicidal ideas: Denies. Nervous/anxious: Denies. Insomnia: Denies.   All other systems reviewed and are negative.   Blood pressure 104/90, pulse 71, temperature 98.3 F (36.8 C), temperature source Oral, resp. rate 16, SpO2 100 %.There is no height or weight on file to calculate BMI.  General Appearance: Casual  Eye Contact:  Good  Speech:  Clear and Coherent and Normal Rate  Volume:  Normal  Mood:  "Fine" Appropriate  Affect:  Appropriate and Congruent  Thought Process:  Coherent, Goal Directed and Descriptions of Associations:  Intact  Orientation:  Full (Time, Place, and Person)  Thought Content:  WDL  Suicidal Thoughts:  No  Homicidal Thoughts:  No  Memory:  Immediate;   Good Recent;   Good  Judgement:  Intact  Insight:  Present  Psychomotor Activity:  Normal  Concentration:  Concentration: Good and Attention Span: Good  Recall:  Good  Fund of Knowledge:  Fair  Language:  Good  Akathisia:  No  Handed:  Right  AIMS (if indicated):     Assets:  Communication Skills Desire for Improvement Housing Social Support  ADL's:  Intact  Cognition:  WNL  Sleep:        Demographic Factors:  Living alone  Loss Factors: NA  Historical Factors: NA  Risk Reduction Factors:   Religious beliefs about death, Positive social support and Positive therapeutic relationship  Continued Clinical Symptoms:  Previous Psychiatric Diagnoses and Treatments  Cognitive Features That Contribute To Risk:  None    Suicide Risk:  Minimal: No identifiable suicidal ideation.  Patients presenting with no risk factors but with morbid ruminations; may be classified as minimal risk based on the severity of the depressive symptoms    Plan Of Care/Follow-up recommendations:  Activity:  As tolerated Diet:  Heart healthy Other:  Follow up with current psychiatric provider / ACTT team and resources given  Disposition: Patient psychiatrically cleared to follow up with resources given  For your behavioral health needs, you are advised to continue treatment with [facility]:       [facility]      [address], [place], [place] [address]      [phone]   Earleen Newport, NP 08/20/2019, 12:16 PM

## 2019-08-20 NOTE — ED Triage Notes (Signed)
Pt says that [name] [name] is trying to kill her and she is scared. Not suicidal Paranoid. She is [place] stay [place]. She was just recently here for the same.

## 2019-08-20 NOTE — ED Triage Notes (Signed)
Pt says that the 'Gods are coming to kill me", says she is "feeling scared" so she came back. Says she "is feeling a little suicidal" no specific plan. Did not take her meds tonight.

## 2019-08-20 NOTE — ED Notes (Signed)
I have called pt several times in the lobby to complete triage assessment without response

## 2019-08-20 NOTE — ED Notes (Signed)
Pt DCd off unit to home per MD.  Pt alert, calm, cooperative, no s/s of distress. DC instructions give to and reviewed with pt, with pt acknowledged understanding. Belongings given to pt. Pt ambulatory off the unit. Pt transported by caregiver team member.

## 2019-08-21 ENCOUNTER — Encounter (HOSPITAL_COMMUNITY): Payer: Self-pay | Admitting: Emergency Medicine

## 2019-08-21 DIAGNOSIS — F329 Major depressive disorder, single episode, unspecified: Secondary | ICD-10-CM | POA: Diagnosis not present

## 2019-08-21 LAB — SARS CORONAVIRUS 2 BY RT PCR (HOSPITAL ORDER, PERFORMED IN ~~LOC~~ HOSPITAL LAB): SARS Coronavirus 2: NEGATIVE

## 2019-08-21 MED ORDER — ONDANSETRON HCL 4 MG PO TABS: 4.0000 mg | ORAL_TABLET | Freq: Three times a day (TID) | ORAL | Status: DC | PRN

## 2019-08-21 MED ORDER — RISPERIDONE 2 MG PO TABS: 6.0000 mg | ORAL_TABLET | Freq: Every day | ORAL | Status: DC

## 2019-08-21 MED ORDER — ALUM & MAG HYDROXIDE-SIMETH 200-200-20 MG/5ML PO SUSP: 30.0000 mL | Freq: Four times a day (QID) | ORAL | Status: DC | PRN

## 2019-08-21 MED ORDER — TEMAZEPAM 15 MG PO CAPS: 30.0000 mg | ORAL_CAPSULE | Freq: Every day | ORAL | Status: DC

## 2019-08-21 NOTE — BH Assessment (Addendum)
Tele Assessment Note   Patient Name: [name] DittyBillie Walkowski MRN: [id number] Referring Physician: Dr. Cy BlamerApril Palumbo, MD Location of Patient: [facility] Location of Provider: Behavioral Health TTS Department  [name] DittyBillie Hemme is a 43 y.o. female who came [facility] due to ongoing thoughts that God is threatening to kill her, which results in her having thoughts that she wants to kill herself. Pt states this has happened to her before, which has caused her great fear. Pt shares God has interfered in the intensive services she receives, the medication she has been unable to get through Medicare, and now her potential housing situation. Pt states God talks to her and calls her bad things and tells her to do bad things, which scares her. Pt states she has a meeting for a new housing arrangement tomorrow and she is greatly concerned that God is going to interfere with this meeting, so is hesitant to stay in the hospital, even if this is what is recommended. Pt states Medicare has stopped paying/is denying payment on one of her medications, and she's unable to get another of her medications, so she is currently only taking 3 of 5 of her medications, so clinician expressed the benefit of coming into the hospital and getting back on all of her medications, but pt did not appear to be convinced.  Pt now denies SI; she shares she has attempted to kill herself on two occasions, with the last incident taking place 2 weeks ago. Pt states she attempted to o/d. Pt was unable to recall how many times and where she has been hospitalized. When clinician inquired as to whether pt currently has a plan to kill herself, pt stated, "I have a plan to get help." Pt denied HI, VH, current NSSIB, access to guns/weapons, engagement in the legal system, and SA.  Pt declined having anyone clinician could contact for collateral information.  Pt is oriented x4. Her recent and remote memory is intact. Pt was cooperative and friendly throughout the  assessment process. Pt's insight, judgement, and impulse control is impaired at this time.   Diagnosis: F25.1, Schizoaffective disorder, Depressive type   Past Medical History:  Past Medical History:  Diagnosis Date  . Arthritis   . Depression     History reviewed. No pertinent surgical history.  Family History: No family history on file.  Social History:  reports that she has never smoked. She has never used smokeless tobacco. She reports previous alcohol use. She reports previous drug use.  Additional Social History:  Alcohol / Drug Use Pain Medications: Please see MAR Prescriptions: Please see MAR Over the Counter: Please see MAR History of alcohol / drug use?: No history of alcohol / drug abuse Longest period of sobriety (when/how long): Please see MAR  CIWA: CIWA-Ar BP: (!) 143/94 Pulse Rate: 76 COWS:    Allergies: No Known Allergies  Home Medications: (Not in a hospital admission)   OB/GYN Status:  No LMP recorded.  General Assessment Data Location of Assessment: [facility] TTS Assessment: In system Is this a Tele or Face-to-Face Assessment?: Tele Assessment Is this an Initial Assessment or a Re-assessment for this encounter?: Initial Assessment Patient Accompanied by:: N/A Language Other than English: No Living Arrangements: Other (Comment)(Pt lives in her own home) What gender do you identify as?: Female Marital status: Single Maiden name: Reola CalkinsGoode Pregnancy Status: No Living Arrangements: Alone Can pt return to current living arrangement?: Yes Admission Status: Voluntary Is patient capable of signing voluntary admission?: Yes Referral Source: Self/Family/Friend  Insurance type: Medicare     Crisis Care Plan Living Arrangements: Alone Legal Guardian: Other:(Self) Name of Psychiatrist: [facility]([employer]) Name of Therapist: [facility] ([employer])  Education Status Is patient currently in school?: No Is the patient employed,  unemployed or receiving disability?: Receiving disability income  Risk to self with the past 6 months Suicidal Ideation: Yes-Currently Present Has patient been a risk to self within the past 6 months prior to admission? : Yes Suicidal Intent: Yes-Currently Present Has patient had any suicidal intent within the past 6 months prior to admission? : Yes Is patient at risk for suicide?: Yes Suicidal Plan?: No Has patient had any suicidal plan within the past 6 months prior to admission? : Yes What has been your use of drugs/alcohol within the last 12 months?: Pt denies SA Previous Attempts/Gestures: Yes How many times?: 2 Other Self Harm Risks: Pt is currently experiencing delusions and paranoia Triggers for Past Attempts: Hallucinations Intentional Self Injurious Behavior: Cutting Comment - Self Injurious Behavior: Pt has engaged in NSSIB via cutting Family Suicide History: No Recent stressful life event(s): Other (Comment)(Pt has been experiencing God talking toher/threatening her) Persecutory voices/beliefs?: Yes Depression: Yes Depression Symptoms: Despondent, Tearfulness, Insomnia, Isolating, Fatigue, Feeling worthless/self pity Substance abuse history and/or treatment for substance abuse?: No Suicide prevention information given to non-admitted patients: Not applicable  Risk to Others within the past 6 months Homicidal Ideation: No Does patient have any lifetime risk of violence toward others beyond the six months prior to admission? : No Thoughts of Harm to Others: No Current Homicidal Intent: No Current Homicidal Plan: No Access to Homicidal Means: No Identified Victim: None noted History of harm to others?: No Assessment of Violence: None Noted Violent Behavior Description: None noted Does patient have access to weapons?: No(Pt denies access to guns/weapons) Criminal Charges Pending?: No Does patient have a court date: No Is patient on probation?:  No  Psychosis Hallucinations: Auditory Delusions: Grandiose, Persecutory  Mental Status Report Appearance/Hygiene: Unremarkable Eye Contact: Good Motor Activity: Freedom of movement, Other (Comment)(Pt is curled up under a hospital blanket in a triage chair) Speech: Slow, Logical/coherent Level of Consciousness: Quiet/awake Mood: Anxious Affect: Appropriate to circumstance Anxiety Level: Minimal Thought Processes: Coherent, Relevant Judgement: Impaired Orientation: Person, Place, Time, Situation Obsessive Compulsive Thoughts/Behaviors: Severe  Cognitive Functioning Concentration: Normal Memory: Recent Intact, Remote Intact Is patient IDD: No Insight: Fair Impulse Control: Fair Appetite: Good Have you had any weight changes? : No Change Sleep: No Change Total Hours of Sleep: 5 Vegetative Symptoms: None  ADLScreening [facility] Assessment Services) Patient's cognitive ability adequate to safely complete daily activities?: Yes Patient able to express need for assistance with ADLs?: Yes Independently performs ADLs?: Yes (appropriate for developmental age)  Prior Inpatient Therapy Prior Inpatient Therapy: Yes Prior Therapy Dates: Per chart, "Multiple; most recent was 06/2019."  Prior Therapy Facilty/Provider(s): Per chart, "[facility], [facility]."  Reason for Treatment: Per chart, "Schizoeffective disorder."  Prior Outpatient Therapy Prior Outpatient Therapy: No Does patient have an ACCT team?: Yes Does patient have Intensive In-House Services?  : No Does patient have [facility] services? : No Does patient have P4CC services?: No  ADL Screening (condition at time of admission) Patient's cognitive ability adequate to safely complete daily activities?: Yes Is the patient deaf or have difficulty hearing?: No Does the patient have difficulty seeing, even when wearing glasses/contacts?: No Does the patient have difficulty concentrating, remembering, or making decisions?: Yes Patient  able to express need for assistance with  ADLs?: Yes Does the patient have difficulty dressing or bathing?: No Independently performs ADLs?: Yes (appropriate for developmental age) Does the patient have difficulty walking or climbing stairs?: No Weakness of Legs: None Weakness of Arms/Hands: None  Home Assistive Devices/Equipment Home Assistive Devices/Equipment: None  Therapy Consults (therapy consults require a physician order) PT Evaluation Needed: No OT Evalulation Needed: No SLP Evaluation Needed: No Abuse/Neglect Assessment (Assessment to be complete while patient is alone) Abuse/Neglect Assessment Can Be Completed: Yes Physical Abuse: Denies Verbal Abuse: Yes, past (Comment)(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 There are currently no appropriate beds available at [facility], so pt's referral information will be faxed out to multiple hospitals for potential placement. This information was provided to pt's nurse, Eula Listen, at [phone].   Disposition Initial Assessment Completed for this Encounter: Yes Patient referred to: Other (Comment)(Pt's referral info will be faxed out to multiple hospitals)  This service was provided via telemedicine using a 2-way, interactive audio and video technology.  Names of all persons participating in this telemedicine service and their role in this encounter. Name: [name] [name] Role: Patient  Name:  Lerry Liner Role: Nurse Practitioner  Name: Duard Brady Role: Clinician    Ralph Dowdy 08/21/2019 6:33 AM

## 2019-08-21 NOTE — ED Notes (Signed)
TTS in progress 

## 2019-08-21 NOTE — ED Provider Notes (Addendum)
[place] DEPT Provider Note   CSN: [id number] Arrival date & time: 08/20/19  2000     History   Chief Complaint Chief Complaint  Patient presents with  . Medical Clearance    HPI [name] [name] is a 43 y.o. female.     The history is provided by the patient.  Mental Health Problem Presenting symptoms: no aggressive behavior, no hallucinations, no homicidal ideas, no self-mutilation, no suicidal thoughts, no suicidal threats and no suicide attempt   Presenting symptoms comment:  The gods were trying to kill her because she is not taking he medications.  She does not have enough money.   Degree of incapacity (severity):  Moderate Onset quality:  Gradual Timing:  Constant Progression:  Unchanged Chronicity:  Recurrent Context: not noncompliant   Treatment compliance:  Some of the time Relieved by:  Antidepressants Worsened by:  Nothing Ineffective treatments:  None tried Associated symptoms: no abdominal pain, no anhedonia, no anxiety, no appetite change, no chest pain, no decreased need for sleep, not distractible, no euphoric mood and no fatigue   Risk factors: hx of mental illness     Past Medical History:  Diagnosis Date  . Arthritis   . Depression     Patient Active Problem List   Diagnosis Date Noted  . Homelessness 07/06/2019  . Adjustment disorder with disturbance of emotion 07/06/2019  . Hallucinations   . Schizoaffective disorder, bipolar type ([place]) 09/01/2018    History reviewed. No pertinent surgical history.   OB History   No obstetric history on file.      Home Medications    Prior to Admission medications   Medication Sig Start Date End Date Taking? Authorizing Provider  benztropine (COGENTIN) 1 MG tablet Take 1 tablet (1 mg total) by mouth 2 (two) times daily. 08/08/19   [name], MD  iloperidone (FANAPT) 4 MG TABS tablet Take 0.5 tablets (2 mg total) by mouth 2 (two) times daily. 08/08/19   [name],  MD  risperiDONE (RISPERDAL) 3 MG tablet Take 2 tablets (6 mg total) by mouth at bedtime. 08/08/19   [name], MD  sertraline (ZOLOFT) 50 MG tablet Take 1 tablet (50 mg total) by mouth daily. 08/09/19   [name], MD  temazepam (RESTORIL) 30 MG capsule Take 1 capsule (30 mg total) by mouth at bedtime. 08/08/19   [name], MD    Family History No family history on file.  Social History Social History   Tobacco Use  . Smoking status: Never Smoker  . Smokeless tobacco: Never Used  Substance Use Topics  . Alcohol use: Not Currently  . Drug use: Not Currently     Allergies   Patient has no known allergies.   Review of Systems Review of Systems  Constitutional: Negative for appetite change and fatigue.  HENT: Negative for congestion.   Eyes: Negative for visual disturbance.  Respiratory: Negative for cough and shortness of breath.   Cardiovascular: Negative for chest pain.  Gastrointestinal: Negative for abdominal pain.  Genitourinary: Negative for difficulty urinating.  Musculoskeletal: Negative for arthralgias.  Skin: Negative for color change.  Neurological: Negative for dizziness.  Psychiatric/Behavioral: Negative for hallucinations, homicidal ideas, self-injury and suicidal ideas. The patient is not nervous/anxious.   All other systems reviewed and are negative.    Physical Exam Updated Vital Signs BP (!) 143/94   Pulse 76   Temp 98.1 F (36.7 C) (Oral)   Resp 16   SpO2 99%   Physical Exam  Vitals signs and nursing note reviewed.  Constitutional:      General: She is not in acute distress.    Appearance: She is normal weight.  HENT:     Head: Normocephalic and atraumatic.     Nose: Nose normal.  Eyes:     Conjunctiva/sclera: Conjunctivae normal.     Pupils: Pupils are equal, round, and reactive to light.  Neck:     Musculoskeletal: Normal range of motion and neck supple.  Cardiovascular:     Rate and Rhythm: Normal rate and regular rhythm.      Pulses: Normal pulses.     Heart sounds: Normal heart sounds.  Pulmonary:     Effort: Pulmonary effort is normal.     Breath sounds: Normal breath sounds.  Abdominal:     General: Abdomen is flat. Bowel sounds are normal.     Tenderness: There is no abdominal tenderness. There is no guarding.  Musculoskeletal: Normal range of motion.  Skin:    General: Skin is warm and dry.     Capillary Refill: Capillary refill takes less than 2 seconds.  Neurological:     General: No focal deficit present.     Mental Status: She is alert and oriented to person, place, and time.  Psychiatric:        Mood and Affect: Mood normal.        Behavior: Behavior normal.      ED Treatments / Results  Labs (all labs ordered are listed, but only abnormal results are displayed) Results for orders placed or performed during the hospital encounter of 08/20/19  Comprehensive metabolic panel  Result Value Ref Range   Sodium 137 135 - 145 mmol/L   Potassium 3.3 (L) 3.5 - 5.1 mmol/L   Chloride 103 98 - 111 mmol/L   CO2 24 22 - 32 mmol/L   Glucose, Bld 96 70 - 99 mg/dL   BUN 11 6 - 20 mg/dL   Creatinine, Ser 6.57 0.44 - 1.00 mg/dL   Calcium 9.3 8.9 - 84.6 mg/dL   Total Protein 7.3 6.5 - 8.1 g/dL   Albumin 4.3 3.5 - 5.0 g/dL   AST 19 15 - 41 U/L   ALT 14 0 - 44 U/L   Alkaline Phosphatase 45 38 - 126 U/L   Total Bilirubin 0.5 0.3 - 1.2 mg/dL   GFR calc non Af Amer >60 >60 mL/min   GFR calc Af Amer >60 >60 mL/min   Anion gap 10 5 - 15  Ethanol  Result Value Ref Range   Alcohol, Ethyl (B) <10 <10 mg/dL  cbc  Result Value Ref Range   WBC 4.0 4.0 - 10.5 K/uL   RBC 4.06 3.87 - 5.11 MIL/uL   Hemoglobin 11.5 (L) 12.0 - 15.0 g/dL   HCT 96.2 95.2 - 84.1 %   MCV 90.1 80.0 - 100.0 fL   MCH 28.3 26.0 - 34.0 pg   MCHC 31.4 30.0 - 36.0 g/dL   RDW 32.4 40.1 - 02.7 %   Platelets 318 150 - 400 K/uL   nRBC 0.0 0.0 - 0.2 %  I-Stat beta hCG blood, ED  Result Value Ref Range   I-stat hCG, quantitative <5.0 <5  mIU/mL   Comment 3           No results found.  Radiology No results found.  Procedures Procedures (including critical care time)  Medications Ordered in ED Medications  risperiDONE (RISPERDAL) tablet 6 mg (has no administration in time range)  temazepam (RESTORIL) capsule 30 mg (has no administration in time range)  ondansetron (ZOFRAN) tablet 4 mg (has no administration in time range)  alum & mag hydroxide-simeth (MAALOX/MYLANTA) 200-200-20 MG/5ML suspension 30 mL (has no administration in time range)  benztropine mesylate (COGENTIN) injection 1 mg (1 mg Intramuscular Given 08/21/19 0045)    Home meds given holding orders placed Final Clinical Impressions(s) / ED Diagnoses   Medically cleared by me, disposition per TTS      Chirstopher Iovino, MD 08/21/19 [id number]    Nicanor AlconPalumbo, Manjot Beumer, MD 08/21/19 [id number]

## 2019-08-31 ENCOUNTER — Emergency Department (HOSPITAL_COMMUNITY)
Admission: EM | Admit: 2019-08-31 | Discharge: 2019-09-01 | Disposition: A | Discharge status: Home / Self Care | Payer: Medicare Other | Attending: Emergency Medicine | Admitting: Emergency Medicine

## 2019-08-31 ENCOUNTER — Encounter (HOSPITAL_COMMUNITY): Payer: Self-pay

## 2019-08-31 DIAGNOSIS — R1084 Generalized abdominal pain: Secondary | ICD-10-CM | POA: Diagnosis not present

## 2019-08-31 DIAGNOSIS — Z79899 Other long term (current) drug therapy: Secondary | ICD-10-CM | POA: Insufficient documentation

## 2019-08-31 DIAGNOSIS — F329 Major depressive disorder, single episode, unspecified: Secondary | ICD-10-CM

## 2019-08-31 DIAGNOSIS — F259 Schizoaffective disorder, unspecified: Secondary | ICD-10-CM | POA: Diagnosis not present

## 2019-08-31 DIAGNOSIS — F250 Schizoaffective disorder, bipolar type: Secondary | ICD-10-CM | POA: Diagnosis present

## 2019-08-31 DIAGNOSIS — R45851 Suicidal ideations: Secondary | ICD-10-CM | POA: Diagnosis not present

## 2019-08-31 DIAGNOSIS — F32A Depression, unspecified: Secondary | ICD-10-CM

## 2019-08-31 LAB — COMPREHENSIVE METABOLIC PANEL
ALT: 11 U/L (ref 0–44)
AST: 17 U/L (ref 15–41)
Albumin: 4.7 g/dL (ref 3.5–5.0)
Alkaline Phosphatase: 43 U/L (ref 38–126)
Anion gap: 11 (ref 5–15)
BUN: 10 mg/dL (ref 6–20)
CO2: 22 mmol/L (ref 22–32)
Calcium: 9.5 mg/dL (ref 8.9–10.3)
Chloride: 104 mmol/L (ref 98–111)
Creatinine, Ser: 0.53 mg/dL (ref 0.44–1.00)
GFR calc Af Amer: >60 mL/min (ref >60)
GFR calc non Af Amer: >60 mL/min (ref >60)
Glucose, Bld: 97 mg/dL (ref 70–99)
Potassium: 3.4 mmol/L — ABNORMAL LOW (ref 3.5–5.1)
Sodium: 137 mmol/L (ref 135–145)
Total Bilirubin: 0.7 mg/dL (ref 0.3–1.2)
Total Protein: 7.9 g/dL (ref 6.5–8.1)

## 2019-08-31 LAB — CBC
HCT: 38.2 % (ref 36.0–46.0)
Hemoglobin: 12.2 g/dL (ref 12.0–15.0)
MCH: 28.2 pg (ref 26.0–34.0)
MCHC: 31.9 g/dL (ref 30.0–36.0)
MCV: 88.4 fL (ref 80.0–100.0)
Platelets: 320 10*3/uL (ref 150–400)
RBC: 4.32 MIL/uL (ref 3.87–5.11)
RDW: 13.5 % (ref 11.5–15.5)
WBC: 5.9 10*3/uL (ref 4.0–10.5)
nRBC: 0 % (ref 0.0–0.2)

## 2019-08-31 LAB — SALICYLATE LEVEL: Salicylate Lvl: <7 mg/dL (ref 2.8–30.0)

## 2019-08-31 LAB — ETHANOL: Alcohol, Ethyl (B): <10 mg/dL (ref <10)

## 2019-08-31 LAB — I-STAT BETA HCG BLOOD, ED (MC, WL, AP ONLY): I-stat hCG, quantitative: <5 m[IU]/mL (ref <5)

## 2019-08-31 LAB — ACETAMINOPHEN LEVEL: Acetaminophen (Tylenol), Serum: <10 ug/mL — ABNORMAL LOW (ref 10–30)

## 2019-08-31 NOTE — ED Notes (Signed)
Pt awake and alert. Pt not responding to questions or communicating. Pt looks anxious.

## 2019-08-31 NOTE — ED Notes (Signed)
TTS at bedside. 

## 2019-08-31 NOTE — Progress Notes (Addendum)
Received [name] [name] at the change of shift asleep in her bed, she refused to respond to this writer and assessment of her VS. She woke up this morning, gave a urine specimen and received fluids. She continued to ignore this Probation officer.  She slept throughout the night.  At 0600 hrs she started  responding to internal stimuli , screaming, loud undetectable words similar to talking in tongues. Upon approach she immediately stopped responding to the internal stimuli.

## 2019-08-31 NOTE — ED Provider Notes (Signed)
[place] DEPT Provider Note   CSN: [id number] Arrival date & time: 08/31/19  1406     History   Chief Complaint Chief Complaint  Patient presents with   Medical Clearance   Suicidal    HPI [name] [name] is a 43 y.o. female with past medical history significant for schizoaffective disorder, hallucinations, and homelessness who presents to the ED via EMS from nearby motel after reporting that she "did not feel good".  On my exam, patient was sitting in bed watching television.  She denied any trauma, injury, nausea, vomiting, chest pain, difficulty breathing, or dizziness.  She refused to elucidate on what she meant by "not feeling good".  When asked, she denies any suicidal or homicidal ideation.  She also denies any AVH, but was continuing to mutter to herself incomprehensibly throughout my exam while picking at her fingers.  She also denies any alcohol or other illicit drug use and nods when asked if she is taking her medications as prescribed.  I get the feeling that she is only giving the answers that she believes I wish to hear.     HPI  Past Medical History:  Diagnosis Date   Arthritis    Depression     Patient Active Problem List   Diagnosis Date Noted   Homelessness 07/06/2019   Adjustment disorder with disturbance of emotion 07/06/2019   Hallucinations    Schizoaffective disorder, bipolar type ([place]) 09/01/2018    History reviewed. No pertinent surgical history.   OB History   No obstetric history on file.      Home Medications    Prior to Admission medications   Medication Sig Start Date End Date Taking? Authorizing Provider  benztropine (COGENTIN) 1 MG tablet Take 1 tablet (1 mg total) by mouth 2 (two) times daily. 08/08/19   [name], MD  iloperidone (FANAPT) 4 MG TABS tablet Take 0.5 tablets (2 mg total) by mouth 2 (two) times daily. 08/08/19   [name], MD  risperiDONE (RISPERDAL) 3 MG tablet Take 2 tablets  (6 mg total) by mouth at bedtime. 08/08/19   [name], MD  sertraline (ZOLOFT) 50 MG tablet Take 1 tablet (50 mg total) by mouth daily. 08/09/19   [name], MD  temazepam (RESTORIL) 30 MG capsule Take 1 capsule (30 mg total) by mouth at bedtime. 08/08/19   [name], MD    Family History History reviewed. No pertinent family history.  Social History Social History   Tobacco Use   Smoking status: Never Smoker   Smokeless tobacco: Never Used  Substance Use Topics   Alcohol use: Not Currently    Comment: BAC not available   Drug use: Not Currently    Comment: UDS not available     Allergies   Patient has no known allergies.   Review of Systems Review of Systems  Unable to perform ROS: Psychiatric disorder     Physical Exam Updated Vital Signs BP 125/78 (BP Location: Left Arm)    Pulse 84    Temp 98.2 F (36.8 C) (Oral)    Resp 16    SpO2 100%   Physical Exam Vitals signs and nursing note reviewed. Exam conducted with a chaperone present.  Constitutional:      Appearance: Normal appearance.  HENT:     Head: Normocephalic and atraumatic.  Eyes:     General: No scleral icterus.    Conjunctiva/sclera: Conjunctivae normal.  Neck:     Musculoskeletal: Normal range of motion.  Cardiovascular:     Rate and Rhythm: Normal rate.  Pulmonary:     Effort: Pulmonary effort is normal.  Musculoskeletal: Normal range of motion.  Skin:    General: Skin is dry.  Neurological:     Mental Status: She is alert.     GCS: GCS eye subscore is 4. GCS verbal subscore is 5. GCS motor subscore is 6.      ED Treatments / Results  Labs (all labs ordered are listed, but only abnormal results are displayed) Labs Reviewed  COMPREHENSIVE METABOLIC PANEL - Abnormal; Notable for the following components:      Result Value   Potassium 3.4 (*)    All other components within normal limits  ACETAMINOPHEN LEVEL - Abnormal; Notable for the following components:   Acetaminophen  (Tylenol), Serum <10 (*)    All other components within normal limits  ETHANOL  SALICYLATE LEVEL  CBC  RAPID URINE DRUG SCREEN, HOSP PERFORMED  I-STAT BETA HCG BLOOD, ED ([facility], [facility], [facility] ONLY)    EKG None  Radiology No results found.  Procedures Procedures (including critical care time)  Medications Ordered in ED Medications - No data to display   Initial Impression / Assessment and Plan / ED Course  I have reviewed the triage vital signs and the nursing notes.  Pertinent labs & imaging results that were available during my care of the patient were reviewed by me and considered in my medical decision making (see chart for details).        Patient retracted when I attempted to perform physical exam.  She refused stethoscope placement and I was unable to conduct an abdominal exam.  She is sitting comfortably on her bed and does not appear to be in any acute distress.  She is muttering to herself incomprehensibly while avoiding eye contact and picking at her fingers.  She appears paranoid, but is able to make appropriate statements and asked appropriate questions.  She is able to ambulate and move about the bed without difficulty.   We will obtain medical clearance labs and subsequently place order for TTS consult.   6:51 PM Reviewed her lab work which is all very reassuring.  Her vital signs are also well within normal months and I am not concerned for any acute medical problems.  Awaiting consultation by TTS to determine disposition for this patient.   6:51 PM Alerted PA Ebbie Ridge at shift change as I still had not yet received determination by TTS. Pending their evaluation and disposition.     Final Clinical Impressions(s) / ED Diagnoses   Final diagnoses:  Suicidal ideation    ED Discharge Orders    None       Lorelee New, PA-C 08/31/19 1851    Gerhard Munch, MD 08/31/19 2202

## 2019-08-31 NOTE — Discharge Instructions (Addendum)
For your behavioral health needs, you are advised to continue treatment with the [place] [facility]:       [facility]      [phone], [place], [place] [address]      [phone]

## 2019-08-31 NOTE — ED Triage Notes (Signed)
Pt BIBA from street near [place]. Pt told [employer] she needed EMS for abd pain. Pt would only tell EMS that she "didn't feel good". Pt refused to answer most questions with EMS.   Pt would not answer any of my questions, other than stating she was SI. Pt did not say anything about abd pain to me.

## 2019-08-31 NOTE — BH Assessment (Signed)
Tele Assessment Note   Patient Name: [name] [name] MRN: [id number] Referring Physician: Crosby Oyster, MD Location of Patient: [facility] Location of Provider: [place] Department  [name] [name] is a 43 y.o. female who presented to [facility] by ambulance with complaint of abdominal pain. She then advised staff that she was suicidal.  Pt lives in [place], and she is followed by [name] [name] (per report).  Pt receives disability pay.  Per notes, Pt has a diagnosis of Schizoaffective Disorder.  Patient was last assessed by TTS on 08/21/2019.  At that time, she stated that God was threatening to kill her.  Pt was assessed today.  She was sitting upright and appeared to engage in pilling behavior -- picking at her fingers and rubbing fingers together.  Pt repeated the same phrase -- ''Sir, can't you see that I don't feel well?'' and ''Sir, I am depressed.''  Pt denied suicidal ideation, homicidal ideation.  When asked what she wanted to see done today, Pt responded, ''Sir, can't you tell that I don't feel well?"  Pt seemed to have little insight into current situation.  Pt denied hallucination, but she appeared to be whispering to herself.  During assessment, Pt presented as alert and oriented to place and name.  Pt had good eye contact.  Mood was reported as depressed, and affect was preoccupied.  Pt repeated the same phrases over and over, except for when she replied, ''No'' when asked about suicidal and homicidal ideation.  Pt's thought processes suggested thought blocking.  Insight and judgment were poor.  Impulse control was fair.    Consulted with T. Hall Busing, NP, who requested input from Pt's ACT [name] before giving final disposition.  Author left a message for [name] [name] at [phone] and requested call-back.  Diagnosis: Schizoaffective Disorder  Past Medical History:  Past Medical History:  Diagnosis Date  . Arthritis   . Depression     History reviewed. No pertinent surgical  history.  Family History: History reviewed. No pertinent family history.  Social History:  reports that she has never smoked. She has never used smokeless tobacco. She reports previous alcohol use. She reports previous drug use.  Additional Social History:  Alcohol / Drug Use Pain Medications: See MAR Prescriptions: See MAR Over the Counter: See MAR History of alcohol / drug use?: No history of alcohol / drug abuse  CIWA: CIWA-Ar BP: 120/73 Pulse Rate: 88 COWS:    Allergies: No Known Allergies  Home Medications: (Not in a hospital admission)   OB/GYN Status:  No LMP recorded.  General Assessment Data Assessment unable to be completed: Yes Reason for not completing assessment: Cannot reach nurse to set up cart [facility] of Assessment: [facility] TTS Assessment: In system Is this a Tele or Face-to-Face Assessment?: Tele Assessment Is this an Initial Assessment or a Re-assessment for this encounter?: Initial Assessment Patient Accompanied by:: N/A Language Other than English: No Living Arrangements: Other (Comment) What gender do you identify as?: Female Marital status: Single Maiden name: Nordgren Pregnancy Status: No Living Arrangements: Alone Can pt return to current living arrangement?: Yes Admission Status: Voluntary Is patient capable of signing voluntary admission?: Yes Referral Source: Self/Family/Friend Insurance type: [place] MCR, [place] MCD     Crisis Care Plan Living Arrangements: Alone Name of Psychiatrist: [name] Name of Therapist: [name].   Education Status Is patient currently in school?: No Is the patient employed, unemployed or receiving disability?: Receiving disability income  Risk to self with  the past 6 months Suicidal Ideation: No-Not Currently/Within Last 6 Months Has patient been a risk to self within the past 6 months prior to admission? : Yes Suicidal Intent: No-Not Currently/Within Last 6 Months Has patient had any suicidal intent within  the past 6 months prior to admission? : Yes Is patient at risk for suicide?: No Suicidal Plan?: No Has patient had any suicidal plan within the past 6 months prior to admission? : Yes Specify Current Suicidal Plan: Previously planned on ODing Access to Means: Yes What has been your use of drugs/alcohol within the last 12 months?: UDS and BAC not available Previous Attempts/Gestures: Yes How many times?: 3 Triggers for Past Attempts: Hallucinations Intentional Self Injurious Behavior: Cutting Comment - Self Injurious Behavior: Hx of self-injury -- cutting Family Suicide History: No Recent stressful life event(s): Other (Comment)(Unknown) Persecutory voices/beliefs?: No(Pt denied, but has history) Depression: Yes Depression Symptoms: Despondent Substance abuse history and/or treatment for substance abuse?: No Suicide prevention information given to non-admitted patients: Not applicable  Risk to Others within the past 6 months Homicidal Ideation: No Does patient have any lifetime risk of violence toward others beyond the six months prior to admission? : No Thoughts of Harm to Others: No Current Homicidal Intent: No Current Homicidal Plan: No Access to Homicidal Means: No Identified Victim: None indicated History of harm to others?: No Assessment of Violence: None Noted Violent Behavior Description: None indicated Does patient have access to weapons?: No Criminal Charges Pending?: No Does patient have a court date: No Is patient on probation?: No  Psychosis Hallucinations: (Hx of auditory, visual) Delusions: None noted(Pt would not say one way or the other)  Mental Status Report Appearance/Hygiene: Unremarkable, In hospital gown Eye Contact: Good Motor Activity: Freedom of movement, Mannerisms(Pilling behavior) Speech: Other (Comment)(Repeating same phrase over and over) Level of Consciousness: Alert Mood: Preoccupied, Sad Affect: Sad Anxiety Level: None Thought  Processes: Coherent, Relevant Judgement: Impaired Orientation: Person, Place, Time, Situation Obsessive Compulsive Thoughts/Behaviors: None  Cognitive Functioning Concentration: Fair Memory: Unable to Assess Is patient IDD: No Insight: Poor Impulse Control: Fair Sleep: Unable to Assess  ADLScreening [facility]) Patient's cognitive ability adequate to safely complete daily activities?: Yes Patient able to express need for assistance with ADLs?: Yes Independently performs ADLs?: Yes (appropriate for developmental age)  Prior Inpatient Therapy Prior Inpatient Therapy: Yes Prior Therapy Dates: Per chart, "Multiple; most recent was 06/2019."  Prior Therapy Facilty/Provider([facility]): [facility]  Reason for Treatment: Per chart, "Schizoeffective disorder."  Prior Outpatient Therapy Prior Outpatient Therapy: Yes Prior Therapy Dates: Ongoing Prior Therapy Facilty/Provider(s): [name] [name]. Reason for Treatment: Medication management, counseling. Does patient have an ACCT [name]?: Yes Does patient have Intensive In-House Services?  : No Does patient have [facility] services? : No Does patient have P4CC services?: No  ADL Screening (condition at time of admission) Patient's cognitive ability adequate to safely complete daily activities?: Yes Is the patient deaf or have difficulty hearing?: No Does the patient have difficulty seeing, even when wearing glasses/contacts?: No Does the patient have difficulty concentrating, remembering, or making decisions?: No Patient able to express need for assistance with ADLs?: Yes Does the patient have difficulty dressing or bathing?: No Independently performs ADLs?: Yes (appropriate for developmental age) Does the patient have difficulty walking or climbing stairs?: No Weakness of Legs: None Weakness of Arms/Hands: None  Home Assistive Devices/Equipment Home Assistive Devices/Equipment: None  Therapy Consults (therapy  consults require a physician order) PT Evaluation Needed: No OT  Evalulation Needed: No SLP Evaluation Needed: No Abuse/Neglect Assessment (Assessment to be complete while patient is alone) Physical Abuse: Denies Verbal Abuse: Yes, past (Comment) Sexual Abuse: Denies Exploitation of patient/patient's resources: Denies Self-Neglect: Denies Values / Beliefs Cultural Requests During Hospitalization: None Spiritual Requests During Hospitalization: None Consults Spiritual Care Consult Needed: No Social Work Consult Needed: No Merchant navy officer (For Healthcare) Does Patient Have a Medical Advance Directive?: No Would patient like information on creating a medical advance directive?: Yes (ED - Information included in AVS)          Disposition:  Disposition Initial Assessment Completed for this Encounter: Yes Patient referred to: Other (Comment)(Seeking contact from [name] [name])  This service was provided via telemedicine using a 2-way, interactive audio and video technology.  Names of all persons participating in this telemedicine service and their role in this encounter. Name: Kylan Veach Role: Pt             Earline Mayotte 08/31/2019 3:44 PM

## 2019-08-31 NOTE — ED Notes (Signed)
Pt guarded, anxious.

## 2019-09-01 ENCOUNTER — Encounter (HOSPITAL_COMMUNITY): Payer: Self-pay | Admitting: Registered Nurse

## 2019-09-01 DIAGNOSIS — F259 Schizoaffective disorder, unspecified: Secondary | ICD-10-CM | POA: Diagnosis not present

## 2019-09-01 DIAGNOSIS — F32A Depression, unspecified: Secondary | ICD-10-CM

## 2019-09-01 DIAGNOSIS — F329 Major depressive disorder, single episode, unspecified: Secondary | ICD-10-CM

## 2019-09-01 DIAGNOSIS — F250 Schizoaffective disorder, bipolar type: Secondary | ICD-10-CM

## 2019-09-01 DIAGNOSIS — R45851 Suicidal ideations: Secondary | ICD-10-CM

## 2019-09-01 LAB — RAPID URINE DRUG SCREEN, HOSP PERFORMED
Amphetamines: NOT DETECTED
Barbiturates: NOT DETECTED
Benzodiazepines: POSITIVE — AB
Cocaine: NOT DETECTED
Opiates: NOT DETECTED
Tetrahydrocannabinol: NOT DETECTED

## 2019-09-01 MED ORDER — STERILE WATER FOR INJECTION IJ SOLN
INTRAMUSCULAR | Status: AC
  Filled 2019-09-01: qty 10

## 2019-09-01 MED ORDER — ILOPERIDONE 2 MG PO TABS
2.0000 mg | ORAL_TABLET | Freq: Two times a day (BID) | ORAL | Status: DC
  Administered 2019-09-01: 2 mg via ORAL
  Filled 2019-09-01: qty 1

## 2019-09-01 MED ORDER — RISPERIDONE 2 MG PO TABS: 6.0000 mg | ORAL_TABLET | Freq: Every day | ORAL | Status: DC

## 2019-09-01 MED ORDER — SERTRALINE HCL 50 MG PO TABS
50.0000 mg | ORAL_TABLET | Freq: Every day | ORAL | Status: DC
  Administered 2019-09-01: 10:00:00 50 mg via ORAL
  Filled 2019-09-01: qty 1

## 2019-09-01 MED ORDER — TEMAZEPAM 15 MG PO CAPS: 30.0000 mg | ORAL_CAPSULE | Freq: Every day | ORAL | Status: DC

## 2019-09-01 MED ORDER — ZIPRASIDONE MESYLATE 20 MG IM SOLR
20.0000 mg | Freq: Once | INTRAMUSCULAR | Status: DC
  Filled 2019-09-01: qty 20

## 2019-09-01 MED ORDER — BENZTROPINE MESYLATE 1 MG PO TABS
1.0000 mg | ORAL_TABLET | Freq: Two times a day (BID) | ORAL | Status: DC
  Administered 2019-09-01: 1 mg via ORAL
  Filled 2019-09-01: qty 1

## 2019-09-01 NOTE — BH Assessment (Signed)
[facility] Assessment Progress Note  Per Hampton Abbot, MD, this pt does not require psychiatric hospitalization at this time.  Pt is to be discharged from [facility] with recommendation to continue treatment with the [facility].  This has been included in pt's discharge instructions.  At 10:57 this Probation officer contacted [employer] and spoke to Marana.  She reports that they are scheduled to see pt today, and will be able to present at [facility] in 20 minutes to pick her up.  Pt's nurse, Diane, has been notified.  Jalene Mullet, [place] Triage Specialist [phone]

## 2019-09-01 NOTE — ED Notes (Signed)
Pt discharged home. Discharged instructions read to pt who verbalized understanding. All belongings returned to pt. Denies SI/HI, is not delusional and not responding to internal stimuli. Escorted pt to the ED exit.   ACT team picked pt up. Pt told as she was leaving that ACT team called to make sure of where to pick her up and are on the way, for her to wait if they are not here immediately.

## 2019-09-01 NOTE — ED Notes (Signed)
ACT team transport called and said that they are here.

## 2019-09-01 NOTE — Consult Note (Signed)
[facility] Psych ED Discharge  09/01/2019 11:08 AM [name] [name]  MRN:  [id number] Principal Problem: Depression Discharge Diagnoses: Principal Problem:   Depression Active Problems:   Schizoaffective disorder, bipolar type [facility])   Subjective: [name] [name], 43 y.o., female patient seen via tele psych by this provider, Dr. Lucianne Muss; and chart reviewed on 09/01/19.  On evaluation [name] [name] reports patient reports she was feeling depressed last night and sick and that is why she came to the hospital.  Patient reports that she is feeling better this morning.  Patient denies suicidal/self-harm/homicidal ideations, psychosis, and paranoia.  Reports she is eating without difficulty and slept well.  Patient reports she is compliant with her medications. During evaluation [name] [name] is alert/oriented x 4; calm/cooperative; and mood is congruent with affect.  She does not appear to be responding to internal/external stimuli or delusional thoughts.  Patient denies suicidal/self-harm/homicidal ideation, psychosis, and paranoia.  Patient answered question appropriately.   Patient's act team was scheduled to meet with her today.  Reports they will pick her up from the emergency room.  Total Time spent with patient: 30 minutes  Past Psychiatric History: Schizoaffective disorder, adjustment disorder  Past Medical History:  Past Medical History:  Diagnosis Date  . Arthritis   . Depression    History reviewed. No pertinent surgical history. Family History: History reviewed. No pertinent family history. Family Psychiatric  History: Unaware Social History:  Social History   Substance and Sexual Activity  Alcohol Use Not Currently   Comment: BAC not available     Social History   Substance and Sexual Activity  Drug Use Not Currently   Comment: UDS not available    Social History   Socioeconomic History  . Marital status: Single    Spouse name: Not on file  . Number of children: Not on file  .  Years of education: Not on file  . Highest education level: Not on file  Occupational History  . Occupation: Unknown  Social Needs  . Financial resource strain: Not on file  . Food insecurity    Worry: Not on file    Inability: Not on file  . Transportation needs    Medical: Not on file    Non-medical: Not on file  Tobacco Use  . Smoking status: Never Smoker  . Smokeless tobacco: Never Used  Substance and Sexual Activity  . Alcohol use: Not Currently    Comment: BAC not available  . Drug use: Not Currently    Comment: UDS not available  . Sexual activity: Not Currently  Lifestyle  . Physical activity    Days per week: Not on file    Minutes per session: Not on file  . Stress: Not on file  Relationships  . Social Musician on phone: Not on file    Gets together: Not on file    Attends religious service: Not on file    Active member of club or organization: Not on file    Attends meetings of clubs or organizations: Not on file    Relationship status: Not on file  Other Topics Concern  . Not on file  Social History Narrative   Pt stated that she lives in [place], and that she lives alone.      Has this patient used any form of tobacco in the last 30 days? (Cigarettes, Smokeless Tobacco, Cigars, and/or Pipes) Prescription not provided because: Patient does not use tobacco products  Current Medications: Current Facility-Administered Medications  Medication  Dose Route Frequency Provider Last Rate Last Dose  . benztropine (COGENTIN) tablet 1 mg  1 mg Oral BID Rancour, Stephen, MD   1 mg at 09/01/19 1012  . Iloperidone TABS 2 mg  2 mg Oral BID Rancour, Jeannett SeniorStephen, MD   2 mg at 09/01/19 1012  . risperiDONE (RISPERDAL) tablet 6 mg  6 mg Oral QHS Rancour, Stephen, MD      . sertraline (ZOLOFT) tablet 50 mg  50 mg Oral Daily Rancour, Stephen, MD   50 mg at 09/01/19 1012  . sterile water (preservative free) injection        Stopped at 09/01/19 0755  . temazepam  (RESTORIL) capsule 30 mg  30 mg Oral QHS Rancour, Stephen, MD      . ziprasidone (GEODON) injection 20 mg  20 mg Intramuscular Once Glynn Octaveancour, Stephen, MD   Stopped at 09/01/19 [phone]   Current Outpatient Medications  Medication Sig Dispense Refill  . benztropine (COGENTIN) 1 MG tablet Take 1 tablet (1 mg total) by mouth 2 (two) times daily. 60 tablet 3  . iloperidone (FANAPT) 4 MG TABS tablet Take 0.5 tablets (2 mg total) by mouth 2 (two) times daily. 30 tablet 2  . risperiDONE (RISPERDAL) 3 MG tablet Take 2 tablets (6 mg total) by mouth at bedtime. 60 tablet 2  . sertraline (ZOLOFT) 50 MG tablet Take 1 tablet (50 mg total) by mouth daily. 90 tablet 1  . temazepam (RESTORIL) 30 MG capsule Take 1 capsule (30 mg total) by mouth at bedtime. 30 capsule 1   PTA Medications: (Not in a hospital admission)   Musculoskeletal: Strength & Muscle Tone: within normal limits Gait & Station: normal Patient leans: N/A  Psychiatric Specialty Exam: Physical Exam  Nursing note and vitals reviewed. Constitutional: She is oriented to person, place, and time. She appears well-developed and well-nourished. No distress.  Neck: Normal range of motion.  Respiratory: Effort normal.  Musculoskeletal: Normal range of motion.  Neurological: She is alert and oriented to person, place, and time.  Psychiatric: She has a normal mood and affect. Her speech is normal and behavior is normal. Judgment and thought content normal. Cognition and memory are normal.    Review of Systems  Psychiatric/Behavioral: Depression:  States she is feeling better this morning.  But was feeling depressed when she came to the hospital last night. Hallucinations:  Denies. Substance abuse:  Denies. Suicidal ideas:  Denies. Nervous/anxious:  Stable. Insomnia:  Denies.   All other systems reviewed and are negative.   Blood pressure 125/78, pulse 84, temperature 98.2 F (36.8 C), temperature source Oral, resp. rate 16, SpO2 100 %.There is no  height or weight on file to calculate BMI.  General Appearance: Casual  Eye Contact:  Good  Speech:  Blocked and Normal Rate  Volume:  Normal  Mood:  Good.  Appropriate  Affect:  Appropriate and Congruent  Thought Process:  Coherent, Goal Directed and Descriptions of Associations: Intact  Orientation:  Full (Time, Place, and Person)  Thought Content:  WDL  Suicidal Thoughts:  No  Homicidal Thoughts:  No  Memory:  Immediate;   Good Recent;   Good  Judgement:  Intact  Insight:  Present  Psychomotor Activity:  Normal  Concentration:  Concentration: Good and Attention Span: Good  Recall:  Good  Fund of Knowledge:  Fair  Language:  Good  Akathisia:  No  Handed:  Right  AIMS (if indicated):     Assets:  Communication Skills Desire for Improvement Housing  Social Support  ADL's:  Intact  Cognition:  WNL  Sleep:        Demographic Factors:  Black female  Loss Factors: NA  Historical Factors: NA  Risk Reduction Factors:   Religious beliefs about death, Positive social support and Positive therapeutic relationship  Continued Clinical Symptoms:  Previous Psychiatric Diagnoses and Treatments  Cognitive Features That Contribute To Risk:  None    Suicide Risk:  Minimal: No identifiable suicidal ideation.  Patients presenting with no risk factors but with morbid ruminations; may be classified as minimal risk based on the severity of the depressive symptoms    Plan Of Care/Follow-up recommendations:  Activity:  As tolerated Diet:  Heart healthy Other:  Follow-up with current outpatient psychiatric provider and ACTT team  Disposition: Patient psychiatrically cleared No evidence of imminent risk to self or others at present.   Patient does not meet criteria for psychiatric inpatient admission. Supportive therapy provided about ongoing stressors. Discussed crisis plan, support from social network, calling 911, coming to the Emergency Department, and calling Suicide  Hotline.   Shuvon Rankin, NP 09/01/2019, 11:08 AM

## 2019-09-01 NOTE — ED Notes (Signed)
Pt offered Geodon IM for agitation but she refused it. Pt currently is not out-of-control enough to justify a forced medication and she is not IVC. She appears manic, responding to internal stimuli, but mostly stays in her room.

## 2019-09-19 ENCOUNTER — Observation Stay (HOSPITAL_COMMUNITY)
Admission: AD | Admit: 2019-09-19 | Discharge: 2019-09-22 | Disposition: A | Discharge status: Home / Self Care | Payer: Medicare Other | Attending: Psychiatry | Admitting: Psychiatry

## 2019-09-19 ENCOUNTER — Encounter (HOSPITAL_COMMUNITY): Payer: Self-pay | Admitting: *Deleted

## 2019-09-19 ENCOUNTER — Other Ambulatory Visit: Payer: Self-pay

## 2019-09-19 DIAGNOSIS — F329 Major depressive disorder, single episode, unspecified: Secondary | ICD-10-CM | POA: Insufficient documentation

## 2019-09-19 DIAGNOSIS — Z79899 Other long term (current) drug therapy: Secondary | ICD-10-CM | POA: Diagnosis not present

## 2019-09-19 DIAGNOSIS — Z20828 Contact with and (suspected) exposure to other viral communicable diseases: Secondary | ICD-10-CM | POA: Insufficient documentation

## 2019-09-19 DIAGNOSIS — F203 Undifferentiated schizophrenia: Secondary | ICD-10-CM | POA: Diagnosis present

## 2019-09-19 DIAGNOSIS — F200 Paranoid schizophrenia: Principal | ICD-10-CM | POA: Insufficient documentation

## 2019-09-19 DIAGNOSIS — F209 Schizophrenia, unspecified: Secondary | ICD-10-CM | POA: Diagnosis present

## 2019-09-19 DIAGNOSIS — F29 Unspecified psychosis not due to a substance or known physiological condition: Secondary | ICD-10-CM | POA: Diagnosis not present

## 2019-09-19 LAB — CBC WITH DIFFERENTIAL/PLATELET
Abs Immature Granulocytes: 0.01 10*3/uL (ref 0.00–0.07)
Basophils Absolute: 0 10*3/uL (ref 0.0–0.1)
Basophils Relative: 0 %
Eosinophils Absolute: 0.1 10*3/uL (ref 0.0–0.5)
Eosinophils Relative: 2 %
HCT: 35.9 % — ABNORMAL LOW (ref 36.0–46.0)
Hemoglobin: 11.4 g/dL — ABNORMAL LOW (ref 12.0–15.0)
Immature Granulocytes: 0 %
Lymphocytes Relative: 42 %
Lymphs Abs: 2 10*3/uL (ref 0.7–4.0)
MCH: 28.1 pg (ref 26.0–34.0)
MCHC: 31.8 g/dL (ref 30.0–36.0)
MCV: 88.4 fL (ref 80.0–100.0)
Monocytes Absolute: 0.4 10*3/uL (ref 0.1–1.0)
Monocytes Relative: 8 %
Neutro Abs: 2.2 10*3/uL (ref 1.7–7.7)
Neutrophils Relative %: 48 %
Platelets: 283 10*3/uL (ref 150–400)
RBC: 4.06 MIL/uL (ref 3.87–5.11)
RDW: 13.6 % (ref 11.5–15.5)
WBC: 4.6 10*3/uL (ref 4.0–10.5)
nRBC: 0 % (ref 0.0–0.2)

## 2019-09-19 LAB — COMPREHENSIVE METABOLIC PANEL
ALT: 12 U/L (ref 0–44)
AST: 19 U/L (ref 15–41)
Albumin: 4.5 g/dL (ref 3.5–5.0)
Alkaline Phosphatase: 40 U/L (ref 38–126)
Anion gap: 9 (ref 5–15)
BUN: 11 mg/dL (ref 6–20)
CO2: 26 mmol/L (ref 22–32)
Calcium: 9.7 mg/dL (ref 8.9–10.3)
Chloride: 100 mmol/L (ref 98–111)
Creatinine, Ser: 0.59 mg/dL (ref 0.44–1.00)
GFR calc Af Amer: >60 mL/min (ref >60)
GFR calc non Af Amer: >60 mL/min (ref >60)
Glucose, Bld: 102 mg/dL — ABNORMAL HIGH (ref 70–99)
Potassium: 3.6 mmol/L (ref 3.5–5.1)
Sodium: 135 mmol/L (ref 135–145)
Total Bilirubin: 0.4 mg/dL (ref 0.3–1.2)
Total Protein: 7.5 g/dL (ref 6.5–8.1)

## 2019-09-19 LAB — SARS CORONAVIRUS 2 BY RT PCR (HOSPITAL ORDER, PERFORMED IN ~~LOC~~ HOSPITAL LAB): SARS Coronavirus 2: NEGATIVE

## 2019-09-19 LAB — LIPID PANEL
Cholesterol: 160 mg/dL (ref 0–200)
HDL: 46 mg/dL (ref >40)
LDL Cholesterol: 104 mg/dL — ABNORMAL HIGH (ref 0–99)
Total CHOL/HDL Ratio: 3.5 RATIO
Triglycerides: 52 mg/dL (ref <150)
VLDL: 10 mg/dL (ref 0–40)

## 2019-09-19 MED ORDER — RISPERIDONE 3 MG PO TABS
6.0000 mg | ORAL_TABLET | Freq: Every day | ORAL | Status: DC
  Administered 2019-09-19 – 2019-09-21 (×3): 6 mg via ORAL
  Filled 2019-09-19 (×4): qty 2

## 2019-09-19 MED ORDER — BENZTROPINE MESYLATE 1 MG PO TABS
1.0000 mg | ORAL_TABLET | Freq: Two times a day (BID) | ORAL | Status: DC
  Administered 2019-09-19 – 2019-09-22 (×6): 1 mg via ORAL
  Filled 2019-09-19 (×6): qty 1

## 2019-09-19 MED ORDER — TEMAZEPAM 15 MG PO CAPS
30.0000 mg | ORAL_CAPSULE | Freq: Every day | ORAL | Status: DC
  Administered 2019-09-19 – 2019-09-21 (×3): 30 mg via ORAL
  Filled 2019-09-19 (×3): qty 2

## 2019-09-19 MED ORDER — LORAZEPAM 1 MG PO TABS
1.0000 mg | ORAL_TABLET | ORAL | Status: AC | PRN
  Administered 2019-09-19: 1 mg via ORAL
  Filled 2019-09-19: qty 1

## 2019-09-19 MED ORDER — SERTRALINE HCL 50 MG PO TABS
50.0000 mg | ORAL_TABLET | Freq: Every day | ORAL | Status: DC
  Administered 2019-09-19 – 2019-09-22 (×4): 50 mg via ORAL
  Filled 2019-09-19 (×4): qty 1

## 2019-09-19 MED ORDER — ZIPRASIDONE MESYLATE 20 MG IM SOLR: 20.0000 mg | Freq: Two times a day (BID) | INTRAMUSCULAR | Status: DC | PRN

## 2019-09-19 MED ORDER — ILOPERIDONE 4 MG PO TABS
2.0000 mg | ORAL_TABLET | Freq: Two times a day (BID) | ORAL | Status: DC
  Administered 2019-09-19 – 2019-09-22 (×6): 2 mg via ORAL
  Filled 2019-09-19 (×11): qty 1

## 2019-09-19 NOTE — Plan of Care (Signed)
[facility] Crisis Plan  Reason for Crisis Plan:  Chronic Mental Illness/Medical Illness, Crisis Stabilization and Medication Management   Plan of Care:  Referral for Inpatient Hospitalization and Referral for Telepsychiatry/Psychiatric Consult  Family Support:   "my sister"   Current Living Environment:  Living Arrangements: Alone("I live in a motel")  Insurance:   Hospital Account    Name Acct ID Class Status Primary Coverage   Erlene, Devita [id number] [place] - MEDICARE PART A AND B        Guarantor Account (for Hospital Account [id number])    Name Relation to Pt Service Area Active? Acct Type   Cassell Smiles Self [facility] Yes Behavioral Health   Address Phone       [address] [place], [place] [address] [phone])          Coverage Information (for Hospital Account [id number])    1. [place] PART A AND B    F/O Payor/Plan Precert #   MEDICARE/MEDICARE PART A AND B    Subscriber Subscriber #   Eloyse, Causey [id number]   Address Phone   [address] [place],  [address]        2. [place]    F/O Payor/Plan Precert #   [facility] MEDICAID/[place]    Subscriber Subscriber #   Raeya, [place] [id number]   Address Phone   [address] [place], [place] [address] [phone]          Legal Guardian:   N/A  Primary Care Provider:  Dr. Oralia Rud. [facility]) Current Outpatient Providers:  [facility] (ACT Team)  Psychiatrist:  Name of Psychiatrist: [facility]  Counselor/Therapist:  Name of Therapist: [facility].   Compliant with Medications:  No  Additional Information: "I don't know, I can't remember".   Keane Police 11/13/20207:11 PM

## 2019-09-19 NOTE — H&P (Addendum)
Behavioral Health Medical Screening Exam  [name] [name] is an 43 y.o. female patient presents to [facility] with complaints of depression, auditory/visual hallucinations, and suicidal ideation.  Patient reports that somebody is trying to hurt her.  Patient appears to be confused, paranoia, and cannot contract for safety.  Patient was brought in by EMS.  Will admit to observation to be reassessed tomorrow.  Home medications will be started.  Total Time spent with patient: 30 minutes  Psychiatric Specialty Exam: Physical Exam  Nursing note and vitals reviewed. Constitutional: She appears well-developed and well-nourished. No distress.  Neck: Normal range of motion.  Respiratory: Effort normal.  Musculoskeletal: Normal range of motion.  Neurological: She is alert.  Skin: Skin is warm and dry.  Psychiatric: Her mood appears anxious. She is actively hallucinating. Thought content is paranoid. She expresses impulsivity. She expresses suicidal ideation.    Review of Systems  Psychiatric/Behavioral: Positive for depression, hallucinations and suicidal ideas. The patient is nervous/anxious.   All other systems reviewed and are negative.   Blood pressure 125/78, pulse 93, temperature 98.2 F (36.8 C), temperature source Oral, resp. rate 16, SpO2 99 %.There is no height or weight on file to calculate BMI.  General Appearance: Casual and Disheveled  Eye Contact:  Poor  Speech:  Slow  Volume:  Decreased  Mood:  Depressed and Paranoia  Affect:  Restricted  Thought Process:  Linear  Orientation:  Full (Time, Place, and Person)  Thought Content:  Hallucinations: Auditory and Paranoid Ideation  Suicidal Thoughts:  Yes.  without intent/plan  Homicidal Thoughts:  No  Memory:  Immediate;   Poor Recent;   Poor  Judgement:  Impaired  Insight:  Lacking  Psychomotor Activity:  Normal  Concentration: Concentration: Fair and Attention Span: Fair  Recall:  [place]: Fair   Akathisia:  No  Handed:  Right  AIMS (if indicated):     Assets:  Desire for Improvement  Sleep:       Musculoskeletal: Strength & Muscle Tone: within normal limits Gait & Station: normal Patient leans: N/A  Blood pressure 125/78, pulse 93, temperature 98.2 F (36.8 C), temperature source Oral, resp. rate 16, SpO2 99 %.  Recommendations: Observation.  Patient to be reassessed tomorrow possible inpatient psychiatric admission.  Based on my evaluation the patient does not appear to have an emergency medical condition.  Shuvon Rankin, NP 09/19/2019, 3:17 PM   Attest to NP note
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
## 2019-09-20 DIAGNOSIS — F329 Major depressive disorder, single episode, unspecified: Secondary | ICD-10-CM | POA: Diagnosis not present

## 2019-09-20 DIAGNOSIS — F200 Paranoid schizophrenia: Secondary | ICD-10-CM | POA: Diagnosis not present

## 2019-09-20 DIAGNOSIS — Z79899 Other long term (current) drug therapy: Secondary | ICD-10-CM | POA: Diagnosis not present

## 2019-09-20 DIAGNOSIS — Z20828 Contact with and (suspected) exposure to other viral communicable diseases: Secondary | ICD-10-CM | POA: Diagnosis not present

## 2019-09-20 NOTE — Progress Notes (Signed)
Patient stayed in bed sleeping and woke up later, irritable, anxious with disorganized thinking and behaviors. Intrusive and frequently redirected. Continues to experience paranoia and responding to internal stimuli. Denies suicidal thoughts. Patient's appetite is good and she is sleeping well. Safety precautions maintained.

## 2019-09-20 NOTE — Progress Notes (Signed)
Patient ID: [name] [name], female   DOB: 05-08-1976, 43 y.o.   MRN: [id number] Pt noted lying on bed awake; no apparent distress noted or complaints. No noted talking to herself in low voice. Pt denies SI, HI, AVH during assessment. Calm and cooperative with assessment.

## 2019-09-20 NOTE — Progress Notes (Signed)
Patient ID: [name] [name], female   DOB: April 10, 1976, 43 y.o.   MRN: [id number] Pt sitting in dayroom watching TV also noted talking to herself in low tone voice. Pt denies SI, HI, and AVH at this time. Pt appears to responding to internal stimuli. Pt denies that she was talking to someone. No apparent distress noted or complaints voiced. Pt contracted for safety; calm and cooperative with staff. Will continue to monitor q 15 mins for safety.

## 2019-09-20 NOTE — Consult Note (Addendum)
Patient seen on mental health rounds today.  She is seen wondering in the hallway and dayroom areas.  She responds appropriately when called by name but appears confused and fatigued. Collateral information received from nursing, the patient is paranoid, delusional and presents very disorganized.  She continues to meet inpatient criteria.  SW will seek inpatient placement.    Patient seen face-to-face for psychiatric evaluation, chart reviewed and case discussed with the physician extender and developed treatment plan. Reviewed the information documented and agree with the treatment plan. Corena Pilgrim, MD

## 2019-09-21 DIAGNOSIS — Z20828 Contact with and (suspected) exposure to other viral communicable diseases: Secondary | ICD-10-CM | POA: Diagnosis not present

## 2019-09-21 DIAGNOSIS — Z79899 Other long term (current) drug therapy: Secondary | ICD-10-CM | POA: Diagnosis not present

## 2019-09-21 DIAGNOSIS — F329 Major depressive disorder, single episode, unspecified: Secondary | ICD-10-CM | POA: Diagnosis not present

## 2019-09-21 DIAGNOSIS — F200 Paranoid schizophrenia: Secondary | ICD-10-CM | POA: Diagnosis not present

## 2019-09-21 NOTE — Consult Note (Addendum)
[facility] Psych ED Progress Note  09/21/2019 12:16 PM [name] [name]  MRN:  [id number] Subjective:   Principal Problem: Schizophrenia [facility]) Diagnosis:  Active Problems:   Schizophrenia [facility])   HPI: Per [name] [name] Admission Note 09/19/2019: Patient seen face to face by this provider: [name] an 43 y.o.femalepatient presents to [facility] complaints of depression,auditory/visual hallucinations, and suicidal ideation.Patient reports that somebody is trying to hurt her. Patient appears to be confused, paranoia, and cannot contract for safety. Patient was brought in by EMS. Will admit to observation to be reassessed tomorrow. Home medications will be started.  November 15,2020: Chart reviewed and patient seen face-to-face for psychiatric evaluation, by this Probation officer and Dr. Darleene Cleaver.  Patient is resting in bed with eyes closed, and does not readily respond when greeted by the [facility] team.  Collateral information received from nursing staff, the patient remains tired, but sleeping more since starting medications.  She remains disorganized and appears to be responding to internal stimuli but remains medication compliant. She continues to meet the requirement for inpatient hospitalization, currently awaiting placement.    Total Time spent with patient: 15 minutes  Past Psychiatric History: depression  Past Medical History:  Past Medical History:  Diagnosis Date  . Arthritis   . Depression    History reviewed. No pertinent surgical history. Family History: History reviewed. No pertinent family history. Family Psychiatric  History: unknown Social History:  Social History   Substance and Sexual Activity  Alcohol Use Not Currently   Comment: BAC not available     Social History   Substance and Sexual Activity  Drug Use Not Currently   Comment: UDS not available    Social History   Socioeconomic History  . Marital status: Single    Spouse name: Not on file  . Number of children: Not on  file  . Years of education: Not on file  . Highest education level: Not on file  Occupational History  . Occupation: Unknown  Social Needs  . Financial resource strain: Not on file  . Food insecurity    Worry: Not on file    Inability: Not on file  . Transportation needs    Medical: Not on file    Non-medical: Not on file  Tobacco Use  . Smoking status: Never Smoker  . Smokeless tobacco: Never Used  Substance and Sexual Activity  . Alcohol use: Not Currently    Comment: BAC not available  . Drug use: Not Currently    Comment: UDS not available  . Sexual activity: Not Currently  Lifestyle  . Physical activity    Days per week: Not on file    Minutes per session: Not on file  . Stress: Not on file  Relationships  . Social Herbalist on phone: Not on file    Gets together: Not on file    Attends religious service: Not on file    Active member of club or organization: Not on file    Attends meetings of clubs or organizations: Not on file    Relationship status: Not on file  Other Topics Concern  . Not on file  Social History Narrative   Pt stated that she lives in [place], and that she lives alone.      Sleep: Good  Appetite:  Good  Current Medications: Current Facility-Administered Medications  Medication Dose Route Frequency Provider Last Rate Last Dose  . benztropine (COGENTIN) tablet 1 mg  1 mg Oral BID Rankin, Shuvon B, NP  1 mg at 09/21/19 0823  . iloperidone (FANAPT) tablet 2 mg  2 mg Oral BID Rankin, Shuvon B, NP   2 mg at 09/21/19 0851  . risperiDONE (RISPERDAL) tablet 6 mg  6 mg Oral QHS Rankin, Shuvon B, NP   6 mg at 09/20/19 2234  . sertraline (ZOLOFT) tablet 50 mg  50 mg Oral Daily Rankin, Shuvon B, NP   50 mg at 09/21/19 0823  . temazepam (RESTORIL) capsule 30 mg  30 mg Oral QHS Rankin, Shuvon B, NP   30 mg at 09/20/19 2235  . ziprasidone (GEODON) injection 20 mg  20 mg Intramuscular Q12H PRN Patrcia Dollyate, Tina L, FNP        Lab Results:   Results for orders placed or performed during the hospital encounter of 09/19/19 (from the past 48 hour(s))  SARS Coronavirus 2 by RT PCR (hospital order, performed in [facility] lab) Nasopharyngeal Nasopharyngeal Swab     Status: None   Collection Time: 09/19/19  5:08 PM   Specimen: Nasopharyngeal Swab  Result Value Ref Range   SARS Coronavirus 2 NEGATIVE NEGATIVE    Comment: (NOTE) If result is NEGATIVE SARS-CoV-2 target nucleic acids are NOT DETECTED. The SARS-CoV-2 RNA is generally detectable in upper and lower  respiratory specimens during the acute phase of infection. The lowest  concentration of SARS-CoV-2 viral copies this assay can detect is 250  copies / mL. A negative result does not preclude SARS-CoV-2 infection  and should not be used as the sole basis for treatment or other  patient management decisions.  A negative result may occur with  improper specimen collection / handling, submission of specimen other  than nasopharyngeal swab, presence of viral mutation(s) within the  areas targeted by this assay, and inadequate number of viral copies  (<250 copies / mL). A negative result must be combined with clinical  observations, patient history, and epidemiological information. If result is POSITIVE SARS-CoV-2 target nucleic acids are DETECTED. The SARS-CoV-2 RNA is generally detectable in upper and lower  respiratory specimens dur ing the acute phase of infection.  Positive  results are indicative of active infection with SARS-CoV-2.  Clinical  correlation with patient history and other diagnostic information is  necessary to determine patient infection status.  Positive results do  not rule out bacterial infection or co-infection with other viruses. If result is PRESUMPTIVE POSTIVE SARS-CoV-2 nucleic acids MAY BE PRESENT.   A presumptive positive result was obtained on the submitted specimen  and confirmed on repeat testing.  While 2019 novel coronavirus   (SARS-CoV-2) nucleic acids may be present in the submitted sample  additional confirmatory testing may be necessary for epidemiological  and / or clinical management purposes  to differentiate between  SARS-CoV-2 and other Sarbecovirus currently known to infect humans.  If clinically indicated additional testing with an alternate test  methodology [phone]([id number]) is advised. The SARS-CoV-2 RNA is generally  detectable in upper and lower respiratory sp ecimens during the acute  phase of infection. The expected result is Negative. Fact Sheet for Patients:  BoilerBrush.com.cyhttps://www.fda.gov/media/136312/download Fact Sheet for Healthcare Providers: https://pope.com/https://www.fda.gov/media/136313/download This test is not yet approved or cleared by the [place] FDA and has been authorized for detection and/or diagnosis of SARS-CoV-2 by FDA under an Emergency Use Authorization (EUA).  This EUA will remain in effect (meaning this test can be used) for the duration of the COVID-19 declaration under Section 564(b)(1) of the Act, 21 U.S.C. section 360bbb-3(b)(1), unless the authorization is terminated or  revoked sooner. Performed at [facility], [address], [place], [place] [address]   CBC with Differential/Platelet     Status: Abnormal   Collection Time: 09/19/19  6:27 PM  Result Value Ref Range   WBC 4.6 4.0 - 10.5 K/uL   RBC 4.06 3.87 - 5.11 MIL/uL   Hemoglobin 11.4 (L) 12.0 - 15.0 g/dL   HCT 60.4 (L) 54.0 - 98.1 %   MCV 88.4 80.0 - 100.0 fL   MCH 28.1 26.0 - 34.0 pg   MCHC 31.8 30.0 - 36.0 g/dL   RDW 19.1 47.8 - 29.5 %   Platelets 283 150 - 400 K/uL   nRBC 0.0 0.0 - 0.2 %   Neutrophils Relative % 48 %   Neutro Abs 2.2 1.7 - 7.7 K/uL   Lymphocytes Relative 42 %   Lymphs Abs 2.0 0.7 - 4.0 K/uL   Monocytes Relative 8 %   Monocytes Absolute 0.4 0.1 - 1.0 K/uL   Eosinophils Relative 2 %   Eosinophils Absolute 0.1 0.0 - 0.5 K/uL   Basophils Relative 0 %   Basophils Absolute 0.0 0.0 -  0.1 K/uL   Immature Granulocytes 0 %   Abs Immature Granulocytes 0.01 0.00 - 0.07 K/uL    Comment: Performed at [facility], [address] [place], [place] [address]  Comprehensive metabolic panel     Status: Abnormal   Collection Time: 09/19/19  6:27 PM  Result Value Ref Range   Sodium 135 135 - 145 mmol/L   Potassium 3.6 3.5 - 5.1 mmol/L   Chloride 100 98 - 111 mmol/L   CO2 26 22 - 32 mmol/L   Glucose, Bld 102 (H) 70 - 99 mg/dL   BUN 11 6 - 20 mg/dL   Creatinine, Ser 8.65 0.44 - 1.00 mg/dL   Calcium 9.7 8.9 - 78.4 mg/dL   Total Protein 7.5 6.5 - 8.1 g/dL   Albumin 4.5 3.5 - 5.0 g/dL   AST 19 15 - 41 U/L   ALT 12 0 - 44 U/L   Alkaline Phosphatase 40 38 - 126 U/L   Total Bilirubin 0.4 0.3 - 1.2 mg/dL   GFR calc non Af Amer >60 >60 mL/min   GFR calc Af Amer >60 >60 mL/min   Anion gap 9 5 - 15    Comment: Performed at [facility], [address] [place], [place] [address]  Lipid panel     Status: Abnormal   Collection Time: 09/19/19  6:27 PM  Result Value Ref Range   Cholesterol 160 0 - 200 mg/dL   Triglycerides 52 <528 mg/dL   HDL 46 >41 mg/dL   Total CHOL/HDL Ratio 3.5 RATIO   VLDL 10 0 - 40 mg/dL   LDL Cholesterol 324 (H) 0 - 99 mg/dL    Comment:        Total Cholesterol/HDL:CHD Risk Coronary Heart Disease Risk Table                     Men   Women  1/2 Average Risk   3.4   3.3  Average Risk       5.0   4.4  2 X Average Risk   9.6   7.1  3 X Average Risk  23.4   11.0        Use the calculated Patient Ratio above and the CHD Risk Table to determine the patient's CHD Risk.  ATP III CLASSIFICATION (LDL):  <100     mg/dL   Optimal  426-834  mg/dL   Near or Above                    Optimal  130-159  mg/dL   Borderline  196-222  mg/dL   High  >979     mg/dL   Very High Performed at [facility], [address], [place], [place] [address]     Blood Alcohol level:  Lab Results  Component Value  Date   [facility] <10 08/31/2019   ETH <10 08/20/2019    Physical Findings: AIMS: Facial and Oral Movements Muscles of Facial Expression: None, normal Lips and Perioral Area: None, normal Jaw: None, normal Tongue: None, normal,Extremity Movements Upper (arms, wrists, hands, fingers): None, normal Lower (legs, knees, ankles, toes): None, normal, Trunk Movements Neck, shoulders, hips: None, normal, Overall Severity Severity of abnormal movements (highest score from questions above): None, normal Incapacitation due to abnormal movements: None, normal Patient's awareness of abnormal movements (rate only patient's report): No Awareness, Dental Status Current problems with teeth and/or dentures?: No Does patient usually wear dentures?: No  CIWA:    COWS:     Musculoskeletal: Strength & Muscle Tone: within normal limits Gait & Station: normal Patient leans: Backward and N/A  Psychiatric Specialty Exam: Physical Exam  Constitutional: She appears well-developed.  HENT:  Head: Normocephalic.  Eyes: Pupils are equal, round, and reactive to light.  Neck: Normal range of motion.  Neurological: She is alert.    Review of Systems  Psychiatric/Behavioral: Negative for hallucinations.    Blood pressure 125/78, pulse 93, temperature 98.2 F (36.8 C), temperature source Oral, resp. rate 16, SpO2 99 %.There is no height or weight on file to calculate BMI.  General Appearance: Casual and Disheveled  Eye Contact:  Poor  Speech:  low  Volume:  Decreased  Mood:  Depressed  Affect:  Flat  Thought Process:  Disorganized and Linear  Orientation:  Negative  Thought Content:  Paranoid Ideation and audible hallucinations  Suicidal Thoughts:  No  Homicidal Thoughts:  No  Memory:  Negative Immediate;   Poor Recent;   Poor Remote;   Poor  Judgement:  Impaired  Insight:  Lacking  Psychomotor Activity:  Normal and Increased  Concentration:  Concentration: Fair and Attention Span: Fair  Recall:   unable to assess  Fund of Knowledge:  Fair  Language:  Fair  Akathisia:  Negative  Handed:  Right  AIMS (if indicated):     Assets:  Desire for Improvement  ADL's:  Impaired  Cognition:  Impaired,  Mild  Sleep:   >6 hours      Treatment Plan Summary: Daily contact with patient to assess and evaluate symptoms and progress in treatment, Medication management and Plan Patient continues to meet requirements for inpatient hospitalization and currently awaiting placement.    Continue with the following medications:  Schizophrenia: iloperidone 2mg  two times daily  Risperidone 6mg  tab po qhs  Drug-Induced Extrapyramidal Reaction: Benztropine 1mg  po two times daily  MDD: Sertraline 50mg  po daily  Sleep: Temazepam 30mg  po qhs  Agitation:  Ziprasidone 20mg  injection, q 12 hours, prn  , NP 09/21/2019, 12:16 PM  Patient seen face-to-face for psychiatric evaluation, chart reviewed and case discussed with the physician extender and developed treatment plan. Reviewed the information documented and agree with the treatment plan. , MD

## 2019-09-21 NOTE — Progress Notes (Signed)
Patient ID: [name] [name], female   DOB: 1976-04-26, 43 y.o.   MRN: [id number] Pt sitting in dayroom watching TV quietly. No apparent distress noted or complaints voiced. Pt is calm and cooperative with staff. Noted wearing her mask. Denies SI, HI, and AVH. Pt is alert and oriented to environment. No attempts made to harm self or others noted. Will continue to monitor q 15 mins for safety.

## 2019-09-21 NOTE — Progress Notes (Signed)
Patient ID: [name] [name], female   DOB: November 14, 1975, 43 y.o.   MRN: [id number] Patient reporting feeling very tired this morning and sleepy, encouraged to get some sleep, denies SI/HI/AVH, and took all meds as scheduled.  Pt being maintained on Q15 minute checks for safety, will continue to monitor.

## 2019-09-22 ENCOUNTER — Encounter (HOSPITAL_COMMUNITY): Payer: Self-pay | Admitting: Registered Nurse

## 2019-09-22 DIAGNOSIS — F200 Paranoid schizophrenia: Secondary | ICD-10-CM | POA: Diagnosis not present

## 2019-09-22 DIAGNOSIS — Z03818 Encounter for observation for suspected exposure to other biological agents ruled out: Secondary | ICD-10-CM | POA: Diagnosis not present

## 2019-09-22 DIAGNOSIS — F329 Major depressive disorder, single episode, unspecified: Secondary | ICD-10-CM | POA: Diagnosis not present

## 2019-09-22 DIAGNOSIS — Z79899 Other long term (current) drug therapy: Secondary | ICD-10-CM | POA: Diagnosis not present

## 2019-09-22 DIAGNOSIS — Z20828 Contact with and (suspected) exposure to other viral communicable diseases: Secondary | ICD-10-CM | POA: Diagnosis not present

## 2019-09-22 MED ORDER — SERTRALINE HCL 50 MG PO TABS: 50.0000 mg | ORAL_TABLET | Freq: Every day | ORAL | 0 refills | Status: DC

## 2019-09-22 MED ORDER — BENZONATATE 100 MG PO CAPS
100.0000 mg | ORAL_CAPSULE | Freq: Three times a day (TID) | ORAL | Status: DC | PRN
  Filled 2019-09-22: qty 1

## 2019-09-22 MED ORDER — BENZONATATE 100 MG PO CAPS: 100.0000 mg | ORAL_CAPSULE | Freq: Three times a day (TID) | ORAL | 0 refills | Status: DC | PRN

## 2019-09-22 NOTE — Discharge Summary (Signed)
[facility]  09/22/2019 1:40 PM [name] DittyBillie Nipper  MRN:  [id number] Principal Problem: Schizophrenia [facility](HCC) Discharge Diagnoses: Principal Problem:   Schizophrenia (HCC)   Subjective: [name] DittyBillie Quizhpi, 43 y.o., female patient seen via tele psych by this provider, Dr. Lucianne MussKumar; and chart reviewed on 09/22/19.  On evaluation [name] [name] reports that she is feeling much better today.  Patient denies auditory and visual hallucinations.  Patient also denies suicidal/self-harm/homicidal ideations and paranoia.  Patient reports that she has been eating and sleeping without any difficulty, and tolerating her medications without any adverse reactions.  Patient states that she came in on Friday related to being in out of her medications but since she has been taking them she is feeling much better and ready to go.  Patient also states that she has a ACT team to visit about 3 times a week. During evaluation [name] DittyBillie Younis is alert/oriented x 4; calm/cooperative; and mood is congruent with affect.  She does not appear to be responding to internal/external stimuli or delusional thoughts at this time.  Patient denies suicidal/self-harm/homicidal ideation, psychosis, and paranoia.  Patient answered question appropriately.     Total Time spent with patient: 30 minutes  Past Psychiatric History: Schizophrenia  Past Medical History:  Past Medical History:  Diagnosis Date  . Arthritis   . Depression    History reviewed. No pertinent surgical history. Family History: History reviewed. No pertinent family history. Family Psychiatric  History: Unaware Social History:  Social History   Substance and Sexual Activity  Alcohol Use Not Currently   Comment: BAC not available     Social History   Substance and Sexual Activity  Drug Use Not Currently   Comment: UDS not available    Social History   Socioeconomic History  . Marital status: Single    Spouse name: Not on file  . Number of children:  Not on file  . Years of education: Not on file  . Highest education level: Not on file  Occupational History  . Occupation: Unknown  Social Needs  . Financial resource strain: Not on file  . Food insecurity    Worry: Not on file    Inability: Not on file  . Transportation needs    Medical: Not on file    Non-medical: Not on file  Tobacco Use  . Smoking status: Never Smoker  . Smokeless tobacco: Never Used  Substance and Sexual Activity  . Alcohol use: Not Currently    Comment: BAC not available  . Drug use: Not Currently    Comment: UDS not available  . Sexual activity: Not Currently  Lifestyle  . Physical activity    Days per week: Not on file    Minutes per session: Not on file  . Stress: Not on file  Relationships  . Social Musicianconnections    Talks on phone: Not on file    Gets together: Not on file    Attends religious service: Not on file    Active member of club or organization: Not on file    Attends meetings of clubs or organizations: Not on file    Relationship status: Not on file  Other Topics Concern  . Not on file  Social History Narrative   Pt stated that she lives in [place], and that she lives alone.      Has this patient used any form of tobacco in the last 30 days? (Cigarettes, Smokeless Tobacco, Cigars, and/or Pipes) Prescription not provided because: Patient does not use  tobacco products  Current Medications: Current Facility-Administered Medications  Medication Dose Route Frequency Provider Last Rate Last Dose  . benzonatate (TESSALON) capsule 100 mg  100 mg Oral TID PRN Mordecai Maes, NP      . benztropine (COGENTIN) tablet 1 mg  1 mg Oral BID ,  B, NP   1 mg at 09/22/19 0825  . iloperidone (FANAPT) tablet 2 mg  2 mg Oral BID ,  B, NP   2 mg at 09/22/19 0825  . risperiDONE (RISPERDAL) tablet 6 mg  6 mg Oral QHS ,  B, NP   6 mg at 09/21/19 2227  . sertraline (ZOLOFT) tablet 50 mg  50 mg Oral Daily ,   B, NP   50 mg at 09/22/19 0825  . temazepam (RESTORIL) capsule 30 mg  30 mg Oral QHS ,  B, NP   30 mg at 09/21/19 2227  . ziprasidone (GEODON) injection 20 mg  20 mg Intramuscular Q12H PRN Emmaline Kluver, FNP       PTA Medications: Medications Prior to Admission  Medication Sig Dispense Refill Last Dose  . ibuprofen (ADVIL) 400 MG tablet Take 400 mg by mouth every 6 (six) hours as needed for headache or mild pain.     . benztropine (COGENTIN) 1 MG tablet Take 1 tablet (1 mg total) by mouth 2 (two) times daily. 60 tablet 3   . iloperidone (FANAPT) 4 MG TABS tablet Take 0.5 tablets (2 mg total) by mouth 2 (two) times daily. 30 tablet 2   . risperiDONE (RISPERDAL) 3 MG tablet Take 2 tablets (6 mg total) by mouth at bedtime. 60 tablet 2   . temazepam (RESTORIL) 30 MG capsule Take 1 capsule (30 mg total) by mouth at bedtime. 30 capsule 1   . [DISCONTINUED] sertraline (ZOLOFT) 50 MG tablet Take 1 tablet (50 mg total) by mouth daily. 90 tablet 1     Musculoskeletal: Strength & Muscle Tone: within normal limits Gait & Station: normal Patient leans: N/A  Psychiatric Specialty Exam: Physical Exam  Nursing note and vitals reviewed. Constitutional: She is oriented to person, place, and time. She appears well-nourished. No distress.  Neck: Normal range of motion.  Respiratory: Effort normal.  Musculoskeletal: Normal range of motion.  Neurological: She is alert and oriented to person, place, and time.  Skin: Skin is warm and dry.  Psychiatric: Anxious:  Stable. She is actively hallucinating ( At baseline). Thought content is not paranoid and not delusional. Cognition and memory are normal. She expresses impulsivity. She expresses no homicidal and no suicidal ideation.    Review of Systems  Psychiatric/Behavioral: Depression:  Stable. Hallucinations:  Denies at this time.  Patient reports that she is not hearing nor seeing anything that others can. Substance abuse:  Denies. Suicidal  ideas:  Denies. Nervous/anxious:  Stable. Insomnia:  Stable.   All other systems reviewed and are negative.   Blood pressure 125/78, pulse 93, temperature 98.2 F (36.8 C), temperature source Oral, resp. rate 16, SpO2 99 %.There is no height or weight on file to calculate BMI.  General Appearance: Casual  Eye Contact:  Good  Speech:  Clear and Coherent and Normal Rate  Volume:  Normal  Mood:  " I am better."  Appropriate  Affect:  Appropriate and Congruent  Thought Process:  Coherent and Goal Directed  Orientation:  Full (Time, Place, and Person)  Thought Content:  WDL  Suicidal Thoughts:  No  Homicidal Thoughts:  No  Memory:  Immediate;  Good Recent;   Good  Judgement:  Intact  Insight:  Present  Psychomotor Activity:  Normal  Concentration:  Concentration: Fair and Attention Span: Fair  Recall:  Good  Fund of Knowledge:  Fair  Language:  Good  Akathisia:  No  Handed:  Right  AIMS (if indicated):     Assets:  Communication Skills Housing Social Support  ADL's:  Intact  Cognition:  WNL  Sleep:        Demographic Factors:  Living alone  Loss Factors: NA  Historical Factors: NA  Risk Reduction Factors:   Religious beliefs about death and Positive therapeutic relationship  Continued Clinical Symptoms:  Previous Psychiatric Diagnoses and Treatments  Cognitive Features That Contribute To Risk:  None    Suicide Risk:  Minimal: No identifiable suicidal ideation.  Patients presenting with no risk factors but with morbid ruminations; may be classified as minimal risk based on the severity of the depressive symptoms  Follow-up Information    [facility]. Call.   Contact information: [address] [place] [address] [phone]           Plan Of Care/Follow-up recommendations:  Activity:  As tolerated Diet:  Heart healthy Other:  Follow-up with current outpatient psychiatric provider  Disposition: Patient psychiatrically  cleared No evidence of imminent risk to self or others at present.   Patient does not meet criteria for psychiatric inpatient admission. Supportive therapy provided about ongoing stressors. Discussed crisis plan, support from social network, calling 911, coming to the Emergency Department, and calling Suicide Hotline.   , NP 09/22/2019, 1:40 PM

## 2019-09-22 NOTE — BH Assessment (Addendum)
[place] Assessment Progress Note  Per Shuvon Rankin, FNP, this pt does not require psychiatric hospitalization at this time.  Pt is to be discharged from the [facility] Observation Unit with recommendation to continue treatment with the [facility].  This has been included in pt's discharge instructions.  Pt's nurse, Nicoletta Dress, has been notified.  Jalene Mullet, [place] Triage Specialist [phone]

## 2019-09-22 NOTE — Progress Notes (Addendum)
Pt awake, visible in dayroom watching TV. A & O to self and place. Denies SI, HI, VH and pain when assessed. Remains suspicious, paranoid and preoccupied on interactions "I believe someone is still trying to kill me". Observed with bouts of nonproductive coughs while in dayroom watching TV, spat in trash can X2 per pt "I have been coughing like this since yesterday". Pt noted talking and whispering to self at intervals. Remains medication complaint when offered. Tolerates all PO intake well. Showered and attended to her ADLs. Support and encouragement offered to pt. Writer notified provider of pt's cough; new order received for Tessalon 100 mg PO TID PRN. Pt made aware. Q 15 minutes checks maintained without self harm gestures or outburst to note thus far.

## 2019-09-22 NOTE — Discharge Instructions (Signed)
For your behavioral health needs, you are advised to continue treatment with the [facility]: ° °     [facility] °     [address] °     [place], [place] [address] °     [phone] °

## 2019-09-22 NOTE — Progress Notes (Signed)
D: Pt A & O X 3. Denies SI, HI, AVH and pain at this time. Observed talking / whispering to self but pt is redirectable and follows commands. D/C home as ordered. Picked up in front of facility by "my sister". A: D/C instructions reviewed with pt including prescriptions, and follow up care with [facility]; whose pt's ACT team; compliance encouraged. All belongings from locker 53 given to pt at time of departure. Scheduled medications administered with verbal education and effects monitored. Safety checks maintained without incident till time of d/c.  R: Pt receptive to care. Compliant with medications when offered. Denies adverse drug reactions when assessed. Verbalized understanding related to d/c instructions. Signed belonging sheet in agreement with items received from locker. Ambulatory with a steady gait. Appears to be in no physical distress at time of departure.

## 2019-09-26 ENCOUNTER — Encounter (HOSPITAL_COMMUNITY): Payer: Self-pay | Admitting: Psychiatry

## 2019-10-02 ENCOUNTER — Other Ambulatory Visit: Payer: Self-pay

## 2019-10-02 ENCOUNTER — Encounter (HOSPITAL_COMMUNITY): Payer: Self-pay | Admitting: Emergency Medicine

## 2019-10-02 ENCOUNTER — Observation Stay (HOSPITAL_COMMUNITY)
Admission: RE | Admit: 2019-10-02 | Discharge: 2019-10-03 | Disposition: A | Discharge status: Home / Self Care | Payer: Medicare Other | Attending: Psychiatry | Admitting: Psychiatry

## 2019-10-02 DIAGNOSIS — F329 Major depressive disorder, single episode, unspecified: Secondary | ICD-10-CM | POA: Insufficient documentation

## 2019-10-02 DIAGNOSIS — M1990 Unspecified osteoarthritis, unspecified site: Secondary | ICD-10-CM | POA: Insufficient documentation

## 2019-10-02 DIAGNOSIS — F200 Paranoid schizophrenia: Secondary | ICD-10-CM | POA: Diagnosis not present

## 2019-10-02 DIAGNOSIS — Z79899 Other long term (current) drug therapy: Secondary | ICD-10-CM | POA: Insufficient documentation

## 2019-10-02 DIAGNOSIS — Z20828 Contact with and (suspected) exposure to other viral communicable diseases: Secondary | ICD-10-CM | POA: Diagnosis not present

## 2019-10-02 DIAGNOSIS — F29 Unspecified psychosis not due to a substance or known physiological condition: Secondary | ICD-10-CM | POA: Diagnosis not present

## 2019-10-02 LAB — SARS CORONAVIRUS 2 BY RT PCR (HOSPITAL ORDER, PERFORMED IN ~~LOC~~ HOSPITAL LAB): SARS Coronavirus 2: NEGATIVE

## 2019-10-02 MED ORDER — TEMAZEPAM 15 MG PO CAPS
30.0000 mg | ORAL_CAPSULE | Freq: Every day | ORAL | Status: DC
  Administered 2019-10-02: 30 mg via ORAL

## 2019-10-02 MED ORDER — BENZTROPINE MESYLATE 1 MG PO TABS
1.0000 mg | ORAL_TABLET | Freq: Two times a day (BID) | ORAL | Status: DC
  Administered 2019-10-02 – 2019-10-03 (×2): 1 mg via ORAL
  Filled 2019-10-02: qty 1

## 2019-10-02 MED ORDER — TEMAZEPAM 15 MG PO CAPS
ORAL_CAPSULE | ORAL | Status: AC
  Filled 2019-10-02: qty 2

## 2019-10-02 MED ORDER — BENZTROPINE MESYLATE 1 MG PO TABS
ORAL_TABLET | ORAL | Status: AC
  Filled 2019-10-02: qty 1

## 2019-10-02 MED ORDER — ILOPERIDONE 4 MG PO TABS
2.0000 mg | ORAL_TABLET | Freq: Two times a day (BID) | ORAL | Status: DC
  Administered 2019-10-03: 2 mg via ORAL
  Filled 2019-10-02 (×5): qty 1

## 2019-10-02 MED ORDER — RISPERIDONE 3 MG PO TABS
ORAL_TABLET | ORAL | Status: AC
  Filled 2019-10-02: qty 2

## 2019-10-02 MED ORDER — SERTRALINE HCL 50 MG PO TABS
50.0000 mg | ORAL_TABLET | Freq: Every day | ORAL | Status: DC
  Administered 2019-10-03: 50 mg via ORAL
  Filled 2019-10-02: qty 1

## 2019-10-02 MED ORDER — RISPERIDONE 3 MG PO TABS
6.0000 mg | ORAL_TABLET | Freq: Every day | ORAL | Status: DC
  Administered 2019-10-02: 6 mg via ORAL

## 2019-10-02 NOTE — H&P (Addendum)
BH Observation Unit Provider Admission PAA/H&P  Patient Identification: [name] [name] MRN:  [id number] Date of Evaluation:  10/02/2019 Chief Complaint:  anxiety Principal Diagnosis: Paranoid schizophrenia (HCC) Diagnosis:  Principal Problem:   Paranoid schizophrenia (HCC)  History of Present Illness: [name] [name], 43 y.o., female patient presented to [facility] as a walk in via EMS.  Patient seen face to face by this provider, consulted with Dr. Jama Flavors; and chart reviewed on 10/02/19.  On evaluation Latissa Frick reports that Jesus has been following her around and messing with her.  States that Jesus had been disorganizing her paper, dumping stuff out of her purse, and telling her to do things causing her to be confused and feels that she is going to do something to hurt herself because she can't take it anymore.  Patient denies suicidal/self-harm/homicidal ideation; but endorses auditory/visual hallucinations and paranoia  Associated Signs/Symptoms: Depression Symptoms:  depressed mood, difficulty concentrating, disturbed sleep, (Hypo) Manic Symptoms:  Delusions, Hallucinations, Impulsivity, Anxiety Symptoms:  Excessive Worry, Psychotic Symptoms:  Hallucinations: Auditory Command:  Ileene Musa is telling her to do things causing her to be confused Paranoia, PTSD Symptoms: Would not elaborate Total Time spent with patient: 30 minutes  Past Psychiatric History: Schizophrenia   Is the patient at risk to self? No.  Has the patient been a risk to self in the past 6 months? Yes.    Has the patient been a risk to self within the distant past? Yes.    Is the patient a risk to others? No.  Has the patient been a risk to others in the past 6 months? No.  Has the patient been a risk to others within the distant past? No.   Prior Inpatient Therapy:  Yes Prior Outpatient Therapy:  Yes  Alcohol Screening:   Substance Abuse History in the last 12 months:  No. Consequences of Substance  Abuse: NA Previous Psychotropic Medications: Yes  Psychological Evaluations: Yes  Past Medical History:  Past Medical History:  Diagnosis Date  . Arthritis   . Depression    No past surgical history on file. Family History: No family history on file. Family Psychiatric History: Unaware  Tobacco Screening:   Social History:  Social History   Substance and Sexual Activity  Alcohol Use Not Currently   Comment: BAC not available     Social History   Substance and Sexual Activity  Drug Use Not Currently   Comment: UDS not available    Additional Social History:                           Allergies:  No Known Allergies Lab Results: No results found for this or any previous visit (from the past 48 hour(s)).  Blood Alcohol level:  Lab Results  Component Value Date   ETH <10 08/31/2019   ETH <10 08/20/2019    Metabolic Disorder Labs:  Lab Results  Component Value Date   HGBA1C 4.8 08/06/2019   MPG 91.06 08/06/2019   MPG 88.19 06/26/2018   Lab Results  Component Value Date   PROLACTIN 28.9 (H) 06/26/2018   Lab Results  Component Value Date   CHOL 160 09/19/2019   TRIG 52 09/19/2019   HDL 46 09/19/2019   CHOLHDL 3.5 09/19/2019   VLDL 10 09/19/2019   LDLCALC 104 (H) 09/19/2019   LDLCALC 90 08/06/2019    Current Medications: Current Outpatient Medications  Medication Sig Dispense Refill  . benzonatate (TESSALON) 100 MG capsule Take  1 capsule (100 mg total) by mouth 3 (three) times daily as needed for cough. 20 capsule 0  . benztropine (COGENTIN) 1 MG tablet Take 1 tablet (1 mg total) by mouth 2 (two) times daily. 60 tablet 3  . iloperidone (FANAPT) 4 MG TABS tablet Take 0.5 tablets (2 mg total) by mouth 2 (two) times daily. 30 tablet 2  . risperiDONE (RISPERDAL) 3 MG tablet Take 2 tablets (6 mg total) by mouth at bedtime. 60 tablet 2  . sertraline (ZOLOFT) 50 MG tablet Take 1 tablet (50 mg total) by mouth daily. 30 tablet 0  . temazepam (RESTORIL) 30  MG capsule Take 1 capsule (30 mg total) by mouth at bedtime. 30 capsule 1   No current facility-administered medications for this encounter.    PTA Medications: (Not in a hospital admission)   Musculoskeletal: Strength & Muscle Tone: within normal limits Gait & Station: normal Patient leans: Right  Psychiatric Specialty Exam: Physical Exam  Nursing note reviewed. Constitutional: She is oriented to person, place, and time. She appears well-nourished. No distress.  Neck: Normal range of motion. Neck supple.  Respiratory: Effort normal.  Musculoskeletal: Normal range of motion.  Neurological: She is alert and oriented to person, place, and time.  Skin: Skin is warm and dry.  Psychiatric: Her speech is normal. Her mood appears anxious. She is actively hallucinating. Thought content is paranoid. Cognition and memory are normal. She expresses impulsivity. She exhibits a depressed mood.    Review of Systems  Psychiatric/Behavioral: Positive for depression and hallucinations. Negative for substance abuse and suicidal ideas. The patient is nervous/anxious. The patient does not have insomnia.   All other systems reviewed and are negative.   Blood pressure 106/78, pulse 93, temperature 98.4 F (36.9 C), temperature source Oral, resp. rate 18, SpO2 100 %.There is no height or weight on file to calculate BMI.  General Appearance: Casual  Eye Contact:  Good  Speech:  Clear and Coherent and Normal Rate  Volume:  Normal  Mood:  Anxious  Affect:  Depressed and Labile  Thought Process:  Coherent and Linear  Orientation:  Full (Time, Place, and Person)  Thought Content:  Hallucinations: Auditory and Paranoid Ideation  Suicidal Thoughts:  No  Homicidal Thoughts:  No  Memory:  Immediate;   Good Recent;   Good  Judgement:  Fair  Insight:  Fair  Psychomotor Activity:  Normal  Concentration:  Concentration: Good and Attention Span: Good  Recall:  Good  Fund of Knowledge:  Fair  Language:   Good  Akathisia:  No  Handed:  Right  AIMS (if indicated):     Assets:  Communication Skills Desire for Improvement Housing  ADL's:  Intact  Cognition:  WNL  Sleep:         Treatment Plan Summary: Plan Overnight Observation possible discharge in morning  Observation Level/Precautions:  15 minute checks Laboratory:  CBC Chemistry Profile UDS UA Ordered Psychotherapy:  As needed Medications:  Home medications restarted Consultations:  As needed Discharge Concerns:  Safety Estimated LOS: Over night Other:      Shuvon Rankin, NP 11/26/20206:18 PM   Attest to NP Note

## 2019-10-02 NOTE — Plan of Care (Signed)
[place] Observation Crisis Plan  Reason for Crisis Plan:  Chronic Mental Illness/Medical Illness, Crisis Stabilization and Medication Management   Plan of Care:  Referral for Telepsychiatry/Psychiatric Consult  Family Support:      Current Living Environment:  Living Arrangements: Alone  Insurance:   Hospital Account    Name Acct ID Class Status Primary Coverage   [name] [name] [id number] BEHAVIORAL HEALTH OBSERVATION Open MEDICARE - MEDICARE PART A AND B        Guarantor Account (for Hospital Account [id number])    Name Relation to Pt Service Area Active? Acct Type   [name] [name] Self [facility] Yes Behavioral Health   Address Phone       [address] [place], [place] [address] [phone])          Coverage Information (for [place] [id number])    1. [place] PART A AND B    F/O Payor/Plan Precert #   MEDICARE/MEDICARE PART A AND B    Subscriber Subscriber #   [name], [name] [id number]   Address Phone   [address], [place] [address]        2. [place]    F/O Payor/Plan Precert #   [facility] MEDICAID/[place]    Subscriber Subscriber #   [name], [name] [id number]   Address Phone   [address], [place] [address] [phone]          Legal Guardian:  Legal Guardian: (NA)  Primary Care Provider:  Patient, No Pcp Per  Current Outpatient Providers:  N/A  Psychiatrist:  Name of Psychiatrist: [facility]  Counselor/Therapist:  Name of Therapist: [facility]  Compliant with Medications:  No  Additional Information:   [name] [name] 11/26/20207:21 PM

## 2019-10-02 NOTE — Progress Notes (Addendum)
Patient ID: Nevayah Faust, female   DOB: [birth year], 43 y.o.   MRN: [id number]   D: Pt alert and oriented during [facility] admission process. Pt denies SI/HI, and VH, and endorses AH. "Jesus is talking to me. He's telling me to move my foot and do things to myself."  Pt is cooperative.  A: Education, support, reassurance, and encouragement provided, q15 minute safety checks initiated. Pt's belongings in locker # 44.   "History of Present Illness: Cassell Smiles, 43 y.o., female patient presented to [facility] as a walk in via EMS.  Patient seen face to face by this provider, consulted with Dr. Parke Poisson; and chart reviewed on 10/02/19.  On evaluation [facility] reports that Jesus has been following her around and messing with her.  States that Jesus had been disorganizing her paper, dumping stuff out of her purse, and telling her to do things causing her to be confused and feels that she is going to do something to hurt herself because she can't take it anymore.  Patient denies suicidal/self-harm/homicidal ideation; but endorses auditory/visual hallucinations and paranoia"  R: Pt denies any concerns at this time, and verbally contracts for safety. Pt ambulating on the unit with no issues. Pt remains safe on the unit.

## 2019-10-02 NOTE — BH Assessment (Signed)
Assessment Note  [name] [name] is an 43 y.o. female that presents this date voluntary brought in by EMS. Patient denies any S/I, H/I or VH. Patient reports ongoing AH for the last two days stating "Jesus has been talking to her and trying to make her mess up things." Patient will not elaborate on content of statement and renders limited history. Patient appears to be confused, paranoia, and cannot contract for safety although denies any thoughts of self harm. Patient when asked in reference to orientation states "Jesus Day" and will not respond any further. Patient has multiple visits to ED and was last seen on 09/19/19 when she presented with similar symptoms. Patient this date exhibited thought blocking and pre-occupation and was difficult to redirect. It is unclear if patient is responding to any internal stimuli. Patient is currently receiving services from [facility] although patient is unsure when she last met with that provider. Patient reports current medication compliance. Per notes patient has had 3 prior attempts at self harm. Patient denies any SA history. place. Patient has a history of Schizoaffective disorder and states she resides alone. Patient seems to have little insight into her current condition. Patient's speech was coherent but repetitive at times and slow. Patient's  mood was preoccupied and affect congruent with mood. Patient's motor activity normal. Patient's judgment and insight are very poor. Patient exhibited thought blocking although did not appear to be responding to internal stimuli. Case was staffed with Rankin NP who recommended patient be observed and monitored.   Diagnosis: Schizoaffective disorder  Past Medical History:  Past Medical History:  Diagnosis Date  . Arthritis   . Depression     No past surgical history on file.  Family History: No family history on file.  Social History:  reports that she has never smoked. She has never used smokeless tobacco. She  reports previous alcohol use. She reports previous drug use.  Additional Social History:  Alcohol / Drug Use Pain Medications: See MAR Prescriptions: See MAR Over the Counter: See MAR History of alcohol / drug use?: No history of alcohol / drug abuse  CIWA: CIWA-Ar BP: 106/78 Pulse Rate: 93 COWS:    Allergies: No Known Allergies  Home Medications:  Medications Prior to Admission  Medication Sig Dispense Refill  . benzonatate (TESSALON) 100 MG capsule Take 1 capsule (100 mg total) by mouth 3 (three) times daily as needed for cough. 20 capsule 0  . benztropine (COGENTIN) 1 MG tablet Take 1 tablet (1 mg total) by mouth 2 (two) times daily. 60 tablet 3  . iloperidone (FANAPT) 4 MG TABS tablet Take 0.5 tablets (2 mg total) by mouth 2 (two) times daily. 30 tablet 2  . risperiDONE (RISPERDAL) 3 MG tablet Take 2 tablets (6 mg total) by mouth at bedtime. 60 tablet 2  . sertraline (ZOLOFT) 50 MG tablet Take 1 tablet (50 mg total) by mouth daily. 30 tablet 0  . temazepam (RESTORIL) 30 MG capsule Take 1 capsule (30 mg total) by mouth at bedtime. 30 capsule 1    OB/GYN Status:  No LMP recorded.  General Assessment Data Location of Assessment: [facility] Assessment Services TTS Assessment: In system Is this a Tele or Face-to-Face Assessment?: Face-to-Face Is this an Initial Assessment or a Re-assessment for this encounter?: Initial Assessment Patient Accompanied by:: N/A Language Other than English: No Living Arrangements: Other (Comment) What gender do you identify as?: Female Marital status: Single Living Arrangements: Alone Can pt return to current living arrangement?: Yes Admission Status:  Voluntary Is patient capable of signing voluntary admission?: Yes Referral Source: Self/Family/Friend Insurance type: Medicaid  Medical Screening Exam ([place]) Medical Exam completed: Yes  Crisis Care Plan Living Arrangements: Alone Legal Guardian: (NA) Name of Psychiatrist: [facility] Name of Therapist: [facility]  Education Status Is patient currently in school?: No Is the patient employed, unemployed or receiving disability?: Receiving disability income  Risk to self with the past 6 months Suicidal Ideation: No Has patient been a risk to self within the past 6 months prior to admission? : No Suicidal Intent: No Has patient had any suicidal intent within the past 6 months prior to admission? : No Is patient at risk for suicide?: Yes Suicidal Plan?: No Has patient had any suicidal plan within the past 6 months prior to admission? : No Specify Current Suicidal Plan: Denies Access to Means: No What has been your use of drugs/alcohol within the last 12 months?: Denies Previous Attempts/Gestures: Yes How many times?: 3 Other Self Harm Risks: (NA) Triggers for Past Attempts: Unknown Intentional Self Injurious Behavior: None Comment - Self Injurious Behavior: (NA) Family Suicide History: No Recent stressful life event(s): Other (Comment)(Holidays per patient) Persecutory voices/beliefs?: Yes Depression: No Depression Symptoms: (Denies) Substance abuse history and/or treatment for substance abuse?: No Suicide prevention information given to non-admitted patients: Not applicable  Risk to Others within the past 6 months Homicidal Ideation: No Does patient have any lifetime risk of violence toward others beyond the six months prior to admission? : No Thoughts of Harm to Others: No Current Homicidal Intent: No Current Homicidal Plan: No Access to Homicidal Means: No Identified Victim: NA History of harm to others?: No Assessment of Violence: None Noted Violent Behavior Description: NA Does patient have access to weapons?: No Criminal Charges Pending?: No Does patient have a court date: No Is patient on probation?: No  Psychosis Hallucinations: Auditory Delusions: None noted  Mental Status Report Appearance/Hygiene: Layered clothes, Poor hygiene Eye  Contact: Fair Motor Activity: Freedom of movement Speech: Pressured Level of Consciousness: Quiet/awake Mood: Preoccupied Affect: Appropriate to circumstance Anxiety Level: Moderate Thought Processes: Thought Blocking Judgement: Partial Orientation: Person, Place, Time Obsessive Compulsive Thoughts/Behaviors: None  Cognitive Functioning Concentration: Decreased Memory: Recent Intact, Remote Intact Is patient IDD: No Insight: Poor Impulse Control: Poor Appetite: Good Have you had any weight changes? : No Change Sleep: No Change Total Hours of Sleep: 6 Vegetative Symptoms: None  ADLScreening [facility]) Patient's cognitive ability adequate to safely complete daily activities?: Yes Patient able to express need for assistance with ADLs?: Yes Independently performs ADLs?: Yes (appropriate for developmental age)  Prior Inpatient Therapy Prior Inpatient Therapy: Yes Prior Therapy Dates: 2020, 2019 Prior Therapy Facilty/Provider(s): [facility], [facility] Reason for Treatment: MH issues  Prior Outpatient Therapy Prior Outpatient Therapy: Yes Prior Therapy Dates: Ongoing Prior Therapy Facilty/Provider(s): [facility] Reason for Treatment: Med mang Does patient have an ACCT team?: Yes Does patient have Intensive In-House Services?  : No Does patient have [facility] services? : No Does patient have P4[facility] services?: No  ADL Screening (condition at time of admission) Patient's cognitive ability adequate to safely complete daily activities?: Yes Is the patient deaf or have difficulty hearing?: No Does the patient have difficulty seeing, even when wearing glasses/contacts?: No Does the patient have difficulty concentrating, remembering, or making decisions?: No Patient able to express need for assistance with ADLs?: Yes Does the patient have difficulty dressing or bathing?: No Independently performs ADLs?: Yes (appropriate for developmental age) Does  the patient have  difficulty walking or climbing stairs?: No Weakness of Legs: None Weakness of Arms/Hands: None  Home Assistive Devices/Equipment Home Assistive Devices/Equipment: None  Therapy Consults (therapy consults require a physician order) PT Evaluation Needed: No OT Evalulation Needed: No SLP Evaluation Needed: No Abuse/Neglect Assessment (Assessment to be complete while patient is alone) Physical Abuse: Denies Verbal Abuse: Denies Sexual Abuse: Denies Exploitation of patient/patient's resources: Denies Self-Neglect: Denies Values / Beliefs Cultural Requests During Hospitalization: None Spiritual Requests During Hospitalization: None Consults Spiritual Care Consult Needed: No Social Work Consult Needed: No Regulatory affairs officer (For Healthcare) Does Patient Have a Medical Advance Directive?: No Would patient like information on creating a medical advance directive?: No - Patient declined          Disposition: Case was staffed with Rankin NP who recommended patient be observed and monitored.   Disposition Initial Assessment Completed for this Encounter: Yes Disposition of Patient: (Observe and monitor)  On Site Evaluation by:   Reviewed with Physician:    Mamie Nick 10/02/2019 6:29 PM

## 2019-10-02 NOTE — BH Assessment (Signed)
[facility] Assessment Progress Note  Case was staffed with Rankin NP who recommended patient be observed and monitored.      

## 2019-10-03 DIAGNOSIS — F200 Paranoid schizophrenia: Secondary | ICD-10-CM | POA: Diagnosis not present

## 2019-10-03 DIAGNOSIS — Z79899 Other long term (current) drug therapy: Secondary | ICD-10-CM | POA: Diagnosis not present

## 2019-10-03 DIAGNOSIS — F329 Major depressive disorder, single episode, unspecified: Secondary | ICD-10-CM | POA: Diagnosis not present

## 2019-10-03 DIAGNOSIS — M1990 Unspecified osteoarthritis, unspecified site: Secondary | ICD-10-CM | POA: Diagnosis not present

## 2019-10-03 DIAGNOSIS — Z20828 Contact with and (suspected) exposure to other viral communicable diseases: Secondary | ICD-10-CM | POA: Diagnosis not present

## 2019-10-03 LAB — COMPREHENSIVE METABOLIC PANEL
ALT: 10 U/L (ref 0–44)
AST: 13 U/L — ABNORMAL LOW (ref 15–41)
Albumin: 3.9 g/dL (ref 3.5–5.0)
Alkaline Phosphatase: 39 U/L (ref 38–126)
Anion gap: 9 (ref 5–15)
BUN: 19 mg/dL (ref 6–20)
CO2: 25 mmol/L (ref 22–32)
Calcium: 9.9 mg/dL (ref 8.9–10.3)
Chloride: 102 mmol/L (ref 98–111)
Creatinine, Ser: 0.53 mg/dL (ref 0.44–1.00)
GFR calc Af Amer: >60 mL/min (ref >60)
GFR calc non Af Amer: >60 mL/min (ref >60)
Glucose, Bld: 89 mg/dL (ref 70–99)
Potassium: 3.9 mmol/L (ref 3.5–5.1)
Sodium: 136 mmol/L (ref 135–145)
Total Bilirubin: 0.3 mg/dL (ref 0.3–1.2)
Total Protein: 6.8 g/dL (ref 6.5–8.1)

## 2019-10-03 LAB — CBC
HCT: 35.9 % — ABNORMAL LOW (ref 36.0–46.0)
Hemoglobin: 11.3 g/dL — ABNORMAL LOW (ref 12.0–15.0)
MCH: 28 pg (ref 26.0–34.0)
MCHC: 31.5 g/dL (ref 30.0–36.0)
MCV: 88.9 fL (ref 80.0–100.0)
Platelets: 300 10*3/uL (ref 150–400)
RBC: 4.04 MIL/uL (ref 3.87–5.11)
RDW: 13.4 % (ref 11.5–15.5)
WBC: 4.6 10*3/uL (ref 4.0–10.5)
nRBC: 0 % (ref 0.0–0.2)

## 2019-10-03 NOTE — Progress Notes (Signed)
Patient has been sleeping and refusing to get up for medications and breakfast. Staff continue to monitor for safety.

## 2019-10-03 NOTE — Progress Notes (Signed)
Patient just woke up, ate lunch and received medications. Was informed about discharge and was cooperative.

## 2019-10-03 NOTE — Progress Notes (Signed)
Patient discharged as recommended. Discharge instructions provided an patient verbalized understanding. Denied SI upon discharge. Belongings returned  And patient has no concerns.

## 2019-10-03 NOTE — Progress Notes (Signed)
Patient was informed about discharge and attempted to call [facility] but the service is closed today. Patient reported that she does not have transportation to go back home. Social worker was contacted to arrange transportation.

## 2019-10-03 NOTE — Discharge Instructions (Addendum)
For your behavioral health needs, you are advised to continue treatment with the [facility] Team:       [facility]      [address] [place]      [place], [place] [address]      [phone]  A different service for which you may be eligible is an ACT Team.  This would offer more support than a [employer], and would replace the services that you are currently receiving.  Ask your [employer] Team about having them refer you to an ACT Team, or contact one of the ACT Team providers listed below directly:       [facility]      [address], [place] [address]      [phone]       [facility]      [address]      [place], [place] [address]      [phone]       [facility]      [facility], [address]      [address]      [place], [place]  [address]      [phone]       [facility]      [address]      [place], [place] [address]      [phone]

## 2019-10-03 NOTE — Discharge Summary (Addendum)
[facility] Observation Discharge  10/03/2019 12:33 PM [name] [name]  MRN:  [id number] Principal Problem: Paranoid schizophrenia [facility]) Discharge Diagnoses: Principal Problem:   Paranoid schizophrenia (HCC)   Subjective: [name] [name], 43 y.o., female patient seen and assessed with Attending MD, chart reviewed on 10/03/19.  On evaluation [name] [name] is observed to be lying in bed and uncooperative. She is difficult to arouse, and appears comatose, she does respond by loud shouting of her name,however does not wake up to particpate in the assessment. Later this day she is observed walking the unit, appears to be compliant with nursing staff and medications. Patient was recently discharged from the hospital about 10 days ago, and she presents with similar presentations. Patient with ongoing and long standing psych history to include schizophrenia that is minimally compliant with her treatment plan. Patient denies si/hi/avh at this time. SHe appears to be much improved since her admission to the unit. She appears to have no difficulty sleeping or eating. It is discovered that patient has CST, not an ACT team. THerefore will recommend referral to ACT team services to ensure ongoing assistance and adherence to medical treatment.   During evaluation [name] is alert/oriented x 4; calm/cooperative; and mood is congruent with affect.  She does not appear to be responding to internal/external stimuli or delusional thoughts at this time.  Patient denies suicidal/self-harm/homicidal ideation, psychosis, and paranoia.  Patient answered question appropriately.     Total Time spent with patient: 30 minutes  Past Psychiatric History: Schizophrenia  Past Medical History:  Past Medical History:  Diagnosis Date  . Arthritis   . Depression    History reviewed. No pertinent surgical history. Family History: History reviewed. No pertinent family history. Family Psychiatric  History: Unaware Social History:   Social History   Substance and Sexual Activity  Alcohol Use Not Currently   Comment: BAC not available     Social History   Substance and Sexual Activity  Drug Use Not Currently   Comment: UDS not available    Social History   Socioeconomic History  . Marital status: Single    Spouse name: Not on file  . Number of children: Not on file  . Years of education: Not on file  . Highest education level: Not on file  Occupational History  . Occupation: Unknown  Social Needs  . Financial resource strain: Not on file  . Food insecurity    Worry: Not on file    Inability: Not on file  . Transportation needs    Medical: Not on file    Non-medical: Not on file  Tobacco Use  . Smoking status: Never Smoker  . Smokeless tobacco: Never Used  Substance and Sexual Activity  . Alcohol use: Not Currently    Comment: BAC not available  . Drug use: Not Currently    Comment: UDS not available  . Sexual activity: Not Currently  Lifestyle  . Physical activity    Days per week: Not on file    Minutes per session: Not on file  . Stress: Not on file  Relationships  . Social Musician on phone: Not on file    Gets together: Not on file    Attends religious service: Not on file    Active member of club or organization: Not on file    Attends meetings of clubs or organizations: Not on file    Relationship status: Not on file  Other Topics Concern  . Not on file  Social History Narrative   Pt stated that she lives in [place], and that she lives alone.      Has this patient used any form of tobacco in the last 30 days? (Cigarettes, Smokeless Tobacco, Cigars, and/or Pipes) Prescription not provided because: Patient does not use tobacco products  Current Medications: Current Facility-Administered Medications  Medication Dose Route Frequency Provider Last Rate Last Dose  . benztropine (COGENTIN) tablet 1 mg  1 mg Oral BID Rankin, Shuvon B, NP   1 mg at 10/03/19 1205  .  iloperidone (FANAPT) tablet 2 mg  2 mg Oral BID Rankin, Shuvon B, NP   2 mg at 10/03/19 1204  . risperiDONE (RISPERDAL) tablet 6 mg  6 mg Oral QHS Rankin, Shuvon B, NP   6 mg at 10/02/19 2221  . sertraline (ZOLOFT) tablet 50 mg  50 mg Oral Daily Rankin, Shuvon B, NP   50 mg at 10/03/19 1205  . temazepam (RESTORIL) capsule 30 mg  30 mg Oral QHS Rankin, Shuvon B, NP   30 mg at 10/02/19 2221   PTA Medications: Medications Prior to Admission  Medication Sig Dispense Refill Last Dose  . benzonatate (TESSALON) 100 MG capsule Take 1 capsule (100 mg total) by mouth 3 (three) times daily as needed for cough. 20 capsule 0   . benztropine (COGENTIN) 1 MG tablet Take 1 tablet (1 mg total) by mouth 2 (two) times daily. 60 tablet 3   . iloperidone (FANAPT) 4 MG TABS tablet Take 0.5 tablets (2 mg total) by mouth 2 (two) times daily. 30 tablet 2   . risperiDONE (RISPERDAL) 3 MG tablet Take 2 tablets (6 mg total) by mouth at bedtime. 60 tablet 2   . sertraline (ZOLOFT) 50 MG tablet Take 1 tablet (50 mg total) by mouth daily. 30 tablet 0   . temazepam (RESTORIL) 30 MG capsule Take 1 capsule (30 mg total) by mouth at bedtime. 30 capsule 1     Musculoskeletal: Strength & Muscle Tone: within normal limits Gait & Station: normal Patient leans: N/A  Psychiatric Specialty Exam: Physical Exam  Nursing note and vitals reviewed. Constitutional: She is oriented to person, place, and time. She appears well-nourished. No distress.  Neck: Normal range of motion.  Respiratory: Effort normal.  Musculoskeletal: Normal range of motion.  Neurological: She is alert and oriented to person, place, and time.  Skin: Skin is warm and dry.  Psychiatric: Anxious:  Stable. She is actively hallucinating ( At baseline). Thought content is not paranoid and not delusional. Cognition and memory are normal. She expresses impulsivity. She expresses no homicidal and no suicidal ideation.    Review of Systems  Psychiatric/Behavioral:  Depression:  Stable. Hallucinations:  Denies at this time.  Patient reports that she is not hearing nor seeing anything that others can. Substance abuse:  Denies. Suicidal ideas:  Denies. Nervous/anxious:  Stable. Insomnia:  Stable.   All other systems reviewed and are negative.   Blood pressure 106/78, pulse 93, temperature 98.4 F (36.9 C), temperature source Oral, resp. rate 18, SpO2 100 %.There is no height or weight on file to calculate BMI.  General Appearance: Casual  Eye Contact:  Good  Speech:  Clear and Coherent and Normal Rate  Volume:  Normal  Mood:  Euthymic  Affect:  Appropriate and Congruent  Thought Process:  Coherent and Goal Directed  Orientation:  Full (Time, Place, and Person)  Thought Content:  WDL  Suicidal Thoughts:  No  Homicidal Thoughts:  No  Memory:  Immediate;   Good Recent;   Good  Judgement:  Intact  Insight:  Present  Psychomotor Activity:  Normal  Concentration:  Concentration: Fair and Attention Span: Fair  Recall:  Good  Fund of Knowledge:  Fair  Language:  Good  Akathisia:  No  Handed:  Right  AIMS (if indicated):     Assets:  Communication Skills Housing Social Support  ADL's:  Intact  Cognition:  WNL  Sleep:        Demographic Factors:  Living alone  Loss Factors: NA  Historical Factors: NA  Risk Reduction Factors:   Religious beliefs about death and Positive therapeutic relationship  Continued Clinical Symptoms:  Previous Psychiatric Diagnoses and Treatments  Cognitive Features That Contribute To Risk:  None    Suicide Risk:  Minimal: No identifiable suicidal ideation.  Patients presenting with no risk factors but with morbid ruminations; may be classified as minimal risk based on the severity of the depressive symptoms   Plan Of Care/Follow-up recommendations:  Activity:  As tolerated Diet:  Heart healthy Other:  Follow-up with current outpatient psychiatric provider  Disposition: Discharge home. Referral to ACT  team services. Due to limitations that be on holiday weekend, patient will need to follow up with her CST team to inquire about increasing her level of care to ACT services. Will include resources about [place].  No evidence of imminent risk to self or others at present.   Patient does not meet criteria for psychiatric inpatient admission. Supportive therapy provided about ongoing stressors. Discussed crisis plan, support from social network, calling 911, coming to the Emergency Department, and calling Suicide Hotline.  Maryagnes Amosakia S Starkes-Perry, FNP 10/03/2019, 12:33 PM    Attest to NP Note

## 2019-10-03 NOTE — BH Assessment (Signed)
[place] Assessment Progress Note  Per Sheran Fava, PMHNP, this pt does not require psychiatric hospitalization at this time.  Pt is to be discharged from the [facility] Observation Unit.  Pt currently receives [employer] through [employer], but she may benefit from changing over to an ACT Team service provider.  Discharge instructions include recommendation to continue treatment with [facility], but also advise her to discuss referral to an ACT Team service provider, and also include contact information for the four area ACT Team providers.  Pt's nurse, Louanne Skye, has been notified.  Jalene Mullet, [facility] Triage Specialist [phone]

## 2019-10-05 ENCOUNTER — Other Ambulatory Visit: Payer: Self-pay

## 2019-10-05 ENCOUNTER — Emergency Department (HOSPITAL_COMMUNITY)
Admission: EM | Admit: 2019-10-05 | Discharge: 2019-10-06 | Disposition: A | Discharge status: Home / Self Care | Payer: Medicare Other | Attending: Emergency Medicine | Admitting: Emergency Medicine

## 2019-10-05 DIAGNOSIS — R440 Auditory hallucinations: Secondary | ICD-10-CM | POA: Diagnosis not present

## 2019-10-05 DIAGNOSIS — F250 Schizoaffective disorder, bipolar type: Secondary | ICD-10-CM | POA: Diagnosis present

## 2019-10-05 DIAGNOSIS — Z0489 Encounter for examination and observation for other specified reasons: Secondary | ICD-10-CM | POA: Diagnosis not present

## 2019-10-05 DIAGNOSIS — F209 Schizophrenia, unspecified: Secondary | ICD-10-CM

## 2019-10-05 DIAGNOSIS — Z20828 Contact with and (suspected) exposure to other viral communicable diseases: Secondary | ICD-10-CM | POA: Insufficient documentation

## 2019-10-05 DIAGNOSIS — Z03818 Encounter for observation for suspected exposure to other biological agents ruled out: Secondary | ICD-10-CM | POA: Diagnosis not present

## 2019-10-05 DIAGNOSIS — R45851 Suicidal ideations: Secondary | ICD-10-CM | POA: Diagnosis not present

## 2019-10-05 DIAGNOSIS — Z590 Homelessness: Secondary | ICD-10-CM | POA: Diagnosis not present

## 2019-10-05 DIAGNOSIS — R442 Other hallucinations: Secondary | ICD-10-CM | POA: Diagnosis not present

## 2019-10-05 DIAGNOSIS — Z79899 Other long term (current) drug therapy: Secondary | ICD-10-CM | POA: Insufficient documentation

## 2019-10-05 LAB — COMPREHENSIVE METABOLIC PANEL
ALT: 11 U/L (ref 0–44)
AST: 17 U/L (ref 15–41)
Albumin: 4.1 g/dL (ref 3.5–5.0)
Alkaline Phosphatase: 41 U/L (ref 38–126)
Anion gap: 9 (ref 5–15)
BUN: 5 mg/dL — ABNORMAL LOW (ref 6–20)
CO2: 25 mmol/L (ref 22–32)
Calcium: 9.2 mg/dL (ref 8.9–10.3)
Chloride: 105 mmol/L (ref 98–111)
Creatinine, Ser: 0.42 mg/dL — ABNORMAL LOW (ref 0.44–1.00)
GFR calc Af Amer: >60 mL/min (ref >60)
GFR calc non Af Amer: >60 mL/min (ref >60)
Glucose, Bld: 80 mg/dL (ref 70–99)
Potassium: 3.2 mmol/L — ABNORMAL LOW (ref 3.5–5.1)
Sodium: 139 mmol/L (ref 135–145)
Total Bilirubin: 0.5 mg/dL (ref 0.3–1.2)
Total Protein: 6.8 g/dL (ref 6.5–8.1)

## 2019-10-05 LAB — CBC WITH DIFFERENTIAL/PLATELET
Abs Immature Granulocytes: 0.01 10*3/uL (ref 0.00–0.07)
Basophils Absolute: 0 10*3/uL (ref 0.0–0.1)
Basophils Relative: 0 %
Eosinophils Absolute: 0 10*3/uL (ref 0.0–0.5)
Eosinophils Relative: 0 %
HCT: 33.2 % — ABNORMAL LOW (ref 36.0–46.0)
Hemoglobin: 10.7 g/dL — ABNORMAL LOW (ref 12.0–15.0)
Immature Granulocytes: 0 %
Lymphocytes Relative: 40 %
Lymphs Abs: 2 10*3/uL (ref 0.7–4.0)
MCH: 28.8 pg (ref 26.0–34.0)
MCHC: 32.2 g/dL (ref 30.0–36.0)
MCV: 89.2 fL (ref 80.0–100.0)
Monocytes Absolute: 0.4 10*3/uL (ref 0.1–1.0)
Monocytes Relative: 8 %
Neutro Abs: 2.6 10*3/uL (ref 1.7–7.7)
Neutrophils Relative %: 52 %
Platelets: 280 10*3/uL (ref 150–400)
RBC: 3.72 MIL/uL — ABNORMAL LOW (ref 3.87–5.11)
RDW: 13.4 % (ref 11.5–15.5)
WBC: 5 10*3/uL (ref 4.0–10.5)
nRBC: 0 % (ref 0.0–0.2)

## 2019-10-05 LAB — RAPID URINE DRUG SCREEN, HOSP PERFORMED
Amphetamines: NOT DETECTED
Barbiturates: NOT DETECTED
Benzodiazepines: POSITIVE — AB
Cocaine: NOT DETECTED
Opiates: NOT DETECTED
Tetrahydrocannabinol: NOT DETECTED

## 2019-10-05 LAB — HCG, QUANTITATIVE, PREGNANCY: hCG, Beta Chain, Quant, S: 1 m[IU]/mL (ref <5)

## 2019-10-05 LAB — ETHANOL: Alcohol, Ethyl (B): <10 mg/dL (ref <10)

## 2019-10-05 MED ORDER — POTASSIUM CHLORIDE CRYS ER 20 MEQ PO TBCR
40.0000 meq | EXTENDED_RELEASE_TABLET | Freq: Once | ORAL | Status: AC
  Administered 2019-10-05: 40 meq via ORAL
  Filled 2019-10-05: qty 2

## 2019-10-05 NOTE — ED Notes (Signed)
Dr. Tamera Punt wrote IVC papers on pt., I faxed them. Magistrate Evans faxed back the findings and Custody order and [employer] has served them @17 :10.

## 2019-10-05 NOTE — ED Triage Notes (Signed)
Per EMS- patient came from [place]. Patient was sent for medical clearance and once cleared she can go back to [place].  Patient is having auditory hallucinations and states that people are trying to kill her Patient does have a history of schizophrenia and has not been taking her medications.

## 2019-10-05 NOTE — Progress Notes (Signed)
10/05/2019  1800  Called lab to come and draw labs. Per lab they will be here as soon as they can.

## 2019-10-05 NOTE — Progress Notes (Signed)
10/05/2019  1651  Pt refused labs

## 2019-10-05 NOTE — Progress Notes (Signed)
Lindon Romp, NP recommends pt be observed and monitored for safety and reassessed in the AM by psych. EDP Malvin Johns, MD and pt's nurse Mickie Kay, RN have been advised.  Lind Covert, MSW, LCSW Therapeutic Triage Specialist  [phone]
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
## 2019-10-05 NOTE — ED Provider Notes (Signed)
[facility]-EMERGENCY DEPT Provider Note   CSN: [id number] Arrival date & time: 10/05/19  1509     History   Chief Complaint Chief Complaint  Patient presents with  . Hallucinations  . Medical Clearance    HPI [name] [name] is a 43 y.o. female.     Patient is a 43 year old female with a history of schizophrenia who presents for medical clearance.  She presented to [facility] and was sent here for medical clearance.  Per report, she is having auditory hallucinations and to me she says that she is scared that people are after her.  She is having very bizarre behavior and is standing in her room continuing to put things in her purse and then take them out again and then put them back in her purse.  She is very fidgety and paranoid.  When I asked her if she has any thoughts of wanting to hurt herself, she will answer me.  She denies any alcohol or drug use.  She tells me that she is taking her medications but per the report from [facility], she has not been taking her medications.  She denies any fevers or recent illnesses.     Past Medical History:  Diagnosis Date  . Arthritis   . Depression     Patient Active Problem List   Diagnosis Date Noted  . Schizophrenia (HCC) 09/19/2019  . Depression 09/01/2019  . Suicidal ideation   . Homelessness 07/06/2019  . Adjustment disorder with disturbance of emotion 07/06/2019  . Paranoid schizophrenia (HCC) 07/01/2019  . Hallucinations   . Schizoaffective disorder, bipolar type (HCC) 09/01/2018    No past surgical history on file.   OB History   No obstetric history on file.      Home Medications    Prior to Admission medications   Medication Sig Start Date End Date Taking? Authorizing Provider  benzonatate (TESSALON) 100 MG capsule Take 1 capsule (100 mg total) by mouth 3 (three) times daily as needed for cough. 09/22/19   Rankin, Shuvon B, NP  benztropine (COGENTIN) 1 MG tablet Take 1 tablet (1 mg total) by  mouth 2 (two) times daily. 08/08/19   Malvin Johns, MD  iloperidone (FANAPT) 4 MG TABS tablet Take 0.5 tablets (2 mg total) by mouth 2 (two) times daily. 08/08/19   Malvin Johns, MD  risperiDONE (RISPERDAL) 3 MG tablet Take 2 tablets (6 mg total) by mouth at bedtime. 08/08/19   Malvin Johns, MD  sertraline (ZOLOFT) 50 MG tablet Take 1 tablet (50 mg total) by mouth daily. 09/22/19   Rankin, Shuvon B, NP  temazepam (RESTORIL) 30 MG capsule Take 1 capsule (30 mg total) by mouth at bedtime. 08/08/19   Malvin Johns, MD    Family History No family history on file.  Social History Social History   Tobacco Use  . Smoking status: Never Smoker  . Smokeless tobacco: Never Used  Substance Use Topics  . Alcohol use: Not Currently    Comment: BAC not available  . Drug use: Not Currently    Comment: UDS not available     Allergies   Patient has no known allergies.   Review of Systems Review of Systems  Unable to perform ROS: Psychiatric disorder     Physical Exam Updated Vital Signs BP 108/67 (BP Location: Left Arm)   Pulse 76   Temp 98.2 F (36.8 C) (Oral)   Resp 17   SpO2 99%   Physical Exam Constitutional:  Appearance: She is well-developed.  HENT:     Head: Normocephalic and atraumatic.  Eyes:     Pupils: Pupils are equal, round, and reactive to light.  Neck:     Musculoskeletal: Normal range of motion and neck supple.  Cardiovascular:     Rate and Rhythm: Normal rate and regular rhythm.     Heart sounds: Normal heart sounds.  Pulmonary:     Effort: Pulmonary effort is normal. No respiratory distress.     Breath sounds: Normal breath sounds. No wheezing or rales.  Chest:     Chest wall: No tenderness.  Abdominal:     General: Bowel sounds are normal.     Palpations: Abdomen is soft.     Tenderness: There is no abdominal tenderness. There is no guarding or rebound.  Musculoskeletal: Normal range of motion.  Lymphadenopathy:     Cervical: No cervical adenopathy.   Skin:    General: Skin is warm and dry.     Findings: No rash.  Neurological:     Mental Status: She is alert and oriented to person, place, and time.  Psychiatric:        Mood and Affect: Affect is labile.        Behavior: Behavior is withdrawn.        Thought Content: Thought content is paranoid.      ED Treatments / Results  Labs (all labs ordered are listed, but only abnormal results are displayed) Labs Reviewed  COMPREHENSIVE METABOLIC PANEL - Abnormal; Notable for the following components:      Result Value   Potassium 3.2 (*)    BUN 5 (*)    Creatinine, Ser 0.42 (*)    All other components within normal limits  CBC WITH DIFFERENTIAL/PLATELET - Abnormal; Notable for the following components:   RBC 3.72 (*)    Hemoglobin 10.7 (*)    HCT 33.2 (*)    All other components within normal limits  ETHANOL  HCG, QUANTITATIVE, PREGNANCY  RAPID URINE DRUG SCREEN, HOSP PERFORMED  I-STAT BETA HCG BLOOD, ED ([facility], [facility], [facility] ONLY)    EKG EKG Interpretation  Date/Time:  Sunday October 05 2019 17:15:50 EST Ventricular Rate:  81 PR Interval:  182 QRS Duration: 82 QT Interval:  402 QTC Calculation: 466 R Axis:   68 Text Interpretation: Normal sinus rhythm Normal ECG since last tracing no significant change Confirmed by Malvin Johns [phone]) on 10/05/2019 5:58:29 PM   Radiology No results found.  Procedures Procedures (including critical care time)  Medications Ordered in ED Medications  potassium chloride SA (KLOR-CON) CR tablet 40 mEq (40 mEq Oral Given 10/05/19 2019)     Initial Impression / Assessment and Plan / ED Course  I have reviewed the triage vital signs and the nursing notes.  Pertinent labs & imaging results that were available during my care of the patient were reviewed by me and considered in my medical decision making (see chart for details).        Patient presents with psychotic behavior. IVC papers were initiated in the ED. She is medically clear  and awaiting TTS evaluation.  Final Clinical Impressions(s) / ED Diagnoses   Final diagnoses:  Schizophrenia, unspecified type [facility])    ED Discharge Orders    None       Malvin Johns, MD 10/05/19 2021

## 2019-10-05 NOTE — ED Notes (Signed)
Patient would not answer any triage questions. Patient was mumbling and Probation officer could not understand her. Patient then clearly stated, Go get the doctor. I want to go home.I want out of here."

## 2019-10-05 NOTE — ED Notes (Signed)
Patient requested her emergency contact [name] [phone] to be notified of her current location and to call her in the morning.  Left voice mail with the instruction.

## 2019-10-05 NOTE — Progress Notes (Signed)
10/05/2019  1850  Labs drawn. Dark green, light green, gold, and purples tubes sent to main lab. Dark green sent to mini lab in ED.

## 2019-10-05 NOTE — ED Notes (Signed)
Dr. Tamera Punt made aware of the situation.

## 2019-10-06 ENCOUNTER — Encounter (HOSPITAL_COMMUNITY): Payer: Self-pay | Admitting: Registered Nurse

## 2019-10-06 LAB — SARS CORONAVIRUS 2 (TAT 6-24 HRS): SARS Coronavirus 2: NEGATIVE

## 2019-10-06 NOTE — Discharge Instructions (Signed)
For your behavioral health needs, you are advised to continue treatment with the [facility]:       [facility]      [address], [place]      [place],  [address]      [phone]

## 2019-10-06 NOTE — ED Notes (Signed)
Pt was discharged home and to self not to [facility].

## 2019-10-06 NOTE — ED Notes (Signed)
Report given To Ulice Dash at [facility].

## 2019-10-06 NOTE — ED Notes (Signed)
Pt is talking with TTS at this time.

## 2019-10-06 NOTE — Consult Note (Signed)
[facility] Psych ED Discharge  10/06/2019 12:36 PM [name] [name]  MRN:  [id number] Principal Problem: <principal problem not specified> Discharge Diagnoses: Active Problems:   Schizoaffective disorder, bipolar type (HCC)   Suicidal ideation   Subjective: [name] [name], 43 y.o., female patient seen via tele psych by this provider, Dr. Lucianne Muss; and chart reviewed on 10/06/19.  Patient was recently discharged from the hospital 10/03/19 and 10 days prior to that both visits with similar presentations. Patient with ongoing and long-standing psychiatric history of schizophrenia with minimal compliance  Patient arrives to ED from [facility] for medical clearance.  On evaluation [name] [name] reports that she is feeling much better this morning and does not feel that she needs inpatient psychiatric treatment.  Currently patient denies suicidal/self-harm/homicidal ideations, psychosis, paranoia. During evaluation [name] [name] is alert/oriented x 4; calm/cooperative; and mood is congruent with affect.  She does not appear to be responding to internal/external stimuli or delusional thoughts.  Patient denies suicidal/self-harm/homicidal ideation, psychosis, and paranoia.  Patient answered question appropriately.  During patient's last visit on 10/03/19 it was noted that patient has services with CST and not ACT.  A referral to act team services was placed to ensure ongoing assistance and adherence to medical and psychiatric treatment.   Total Time spent with patient: 30 minutes  Past Psychiatric History: See above  Past Medical History:  Past Medical History:  Diagnosis Date  . Arthritis   . Depression    History reviewed. No pertinent surgical history. Family History: History reviewed. No pertinent family history. Family Psychiatric  History: Unaware Social History:  Social History   Substance and Sexual Activity  Alcohol Use Not Currently   Comment: BAC not available     Social History   Substance and  Sexual Activity  Drug Use Not Currently   Comment: UDS not available    Social History   Socioeconomic History  . Marital status: Single    Spouse name: Not on file  . Number of children: Not on file  . Years of education: Not on file  . Highest education level: Not on file  Occupational History  . Occupation: Unknown  Social Needs  . Financial resource strain: Not on file  . Food insecurity    Worry: Not on file    Inability: Not on file  . Transportation needs    Medical: Not on file    Non-medical: Not on file  Tobacco Use  . Smoking status: Never Smoker  . Smokeless tobacco: Never Used  Substance and Sexual Activity  . Alcohol use: Not Currently    Comment: BAC not available  . Drug use: Not Currently    Comment: UDS not available  . Sexual activity: Not Currently  Lifestyle  . Physical activity    Days per week: Not on file    Minutes per session: Not on file  . Stress: Not on file  Relationships  . Social Musician on phone: Not on file    Gets together: Not on file    Attends religious service: Not on file    Active member of club or organization: Not on file    Attends meetings of clubs or organizations: Not on file    Relationship status: Not on file  Other Topics Concern  . Not on file  Social History Narrative   Pt stated that she lives in [place], and that she lives alone.      Has this patient used any form of tobacco  in the last 30 days? (Cigarettes, Smokeless Tobacco, Cigars, and/or Pipes) Prescription not provided because: Patient does not use tobacco products  Current Medications: No current facility-administered medications for this encounter.    Current Outpatient Medications  Medication Sig Dispense Refill  . benzonatate (TESSALON) 100 MG capsule Take 1 capsule (100 mg total) by mouth 3 (three) times daily as needed for cough. 20 capsule 0  . benztropine (COGENTIN) 1 MG tablet Take 1 tablet (1 mg total) by mouth 2 (two) times  daily. 60 tablet 3  . iloperidone (FANAPT) 4 MG TABS tablet Take 0.5 tablets (2 mg total) by mouth 2 (two) times daily. 30 tablet 2  . risperiDONE (RISPERDAL) 3 MG tablet Take 2 tablets (6 mg total) by mouth at bedtime. 60 tablet 2  . sertraline (ZOLOFT) 50 MG tablet Take 1 tablet (50 mg total) by mouth daily. 30 tablet 0  . temazepam (RESTORIL) 30 MG capsule Take 1 capsule (30 mg total) by mouth at bedtime. 30 capsule 1   PTA Medications: (Not in a hospital admission)   Musculoskeletal: Strength & Muscle Tone: within normal limits Gait & Station: normal Patient leans: N/A  Psychiatric Specialty Exam: Physical Exam  ROS  Blood pressure 109/68, pulse 95, temperature 98.6 F (37 C), temperature source Oral, resp. rate 18, SpO2 99 %.There is no height or weight on file to calculate BMI.  General Appearance: Casual  Eye Contact:  Good  Speech:  Clear and Coherent and Normal Rate  Volume:  Normal  Mood:  " Good" appropriate  Affect:  Appropriate and Congruent  Thought Process:  Coherent and Goal Directed  Orientation:  Full (Time, Place, and Person)  Thought Content:  WDL  Suicidal Thoughts:  No  Homicidal Thoughts:  No  Memory:  Immediate;   Good Recent;   Good  Judgement:  Intact  Insight:  Present  Psychomotor Activity:  Normal  Concentration:  Concentration: Good and Attention Span: Good  Recall:  Good  Fund of Knowledge:  Fair  Language:  Good  Akathisia:  No  Handed:  Right  AIMS (if indicated):     Assets:  Communication Skills Desire for Improvement Housing Social Support  ADL's:  Intact  Cognition:  WNL  Sleep:        Demographic Factors:  NA  Loss Factors: NA  Historical Factors: NA  Risk Reduction Factors:   Religious beliefs about death, Positive social support and Positive therapeutic relationship  Continued Clinical Symptoms:  Previous Psychiatric Diagnoses and Treatments  Cognitive Features That Contribute To Risk:  None    Suicide  Risk:  Minimal: No identifiable suicidal ideation.  Patients presenting with no risk factors but with morbid ruminations; may be classified as minimal risk based on the severity of the depressive symptoms    Plan Of Care/Follow-up recommendations:  Activity:  As tolerated Diet:  Heart healthy Other:  Follow-up with current outpatient psychiatric provider and resources given   Disposition: No evidence of imminent risk to self or others at present.   Patient does not meet criteria for psychiatric inpatient admission. Supportive therapy provided about ongoing stressors. Discussed crisis plan, support from social network, calling 911, coming to the Emergency Department, and calling Suicide Hotline.  Shuvon Rankin, NP 10/06/2019, 12:36 PM

## 2019-10-06 NOTE — BH Assessment (Signed)
[facility] Assessment Progress Note  Per Shuvon Rankin, FNP, this pt does not require psychiatric hospitalization at this time.  Pt presents under IVC initiated by EDP Malvin Johns, MD, which Hampton Abbot, MD has rescinded.  Pt is to be discharged from [facility] with recommendation to continue treatment with the [facility].  Pt's nurse, Celestial, has been notified.  Jalene Mullet, [facility] Triage Specialist [phone]

## 2019-10-09 ENCOUNTER — Other Ambulatory Visit: Payer: Self-pay

## 2019-10-09 ENCOUNTER — Emergency Department (HOSPITAL_COMMUNITY)
Admission: EM | Admit: 2019-10-09 | Discharge: 2019-10-11 | Disposition: A | Discharge status: Home / Self Care | Payer: Medicare Other | Attending: Emergency Medicine | Admitting: Emergency Medicine

## 2019-10-09 DIAGNOSIS — F209 Schizophrenia, unspecified: Secondary | ICD-10-CM

## 2019-10-09 DIAGNOSIS — F4329 Adjustment disorder with other symptoms: Secondary | ICD-10-CM | POA: Diagnosis not present

## 2019-10-09 DIAGNOSIS — Z20828 Contact with and (suspected) exposure to other viral communicable diseases: Secondary | ICD-10-CM | POA: Insufficient documentation

## 2019-10-09 DIAGNOSIS — Z046 Encounter for general psychiatric examination, requested by authority: Secondary | ICD-10-CM | POA: Diagnosis present

## 2019-10-09 DIAGNOSIS — Z79899 Other long term (current) drug therapy: Secondary | ICD-10-CM | POA: Insufficient documentation

## 2019-10-09 DIAGNOSIS — F29 Unspecified psychosis not due to a substance or known physiological condition: Secondary | ICD-10-CM | POA: Diagnosis not present

## 2019-10-09 DIAGNOSIS — R442 Other hallucinations: Secondary | ICD-10-CM | POA: Diagnosis not present

## 2019-10-09 DIAGNOSIS — F250 Schizoaffective disorder, bipolar type: Secondary | ICD-10-CM | POA: Diagnosis not present

## 2019-10-09 LAB — COMPREHENSIVE METABOLIC PANEL
ALT: 12 U/L (ref 0–44)
AST: 19 U/L (ref 15–41)
Albumin: 4 g/dL (ref 3.5–5.0)
Alkaline Phosphatase: 38 U/L (ref 38–126)
Anion gap: 7 (ref 5–15)
BUN: 11 mg/dL (ref 6–20)
CO2: 25 mmol/L (ref 22–32)
Calcium: 9.3 mg/dL (ref 8.9–10.3)
Chloride: 104 mmol/L (ref 98–111)
Creatinine, Ser: 0.67 mg/dL (ref 0.44–1.00)
GFR calc Af Amer: >60 mL/min (ref >60)
GFR calc non Af Amer: >60 mL/min (ref >60)
Glucose, Bld: 84 mg/dL (ref 70–99)
Potassium: 3.3 mmol/L — ABNORMAL LOW (ref 3.5–5.1)
Sodium: 136 mmol/L (ref 135–145)
Total Bilirubin: 0.3 mg/dL (ref 0.3–1.2)
Total Protein: 6.5 g/dL (ref 6.5–8.1)

## 2019-10-09 LAB — CBC
HCT: 32 % — ABNORMAL LOW (ref 36.0–46.0)
Hemoglobin: 10.4 g/dL — ABNORMAL LOW (ref 12.0–15.0)
MCH: 28.2 pg (ref 26.0–34.0)
MCHC: 32.5 g/dL (ref 30.0–36.0)
MCV: 86.7 fL (ref 80.0–100.0)
Platelets: 269 10*3/uL (ref 150–400)
RBC: 3.69 MIL/uL — ABNORMAL LOW (ref 3.87–5.11)
RDW: 13.4 % (ref 11.5–15.5)
WBC: 5.1 10*3/uL (ref 4.0–10.5)
nRBC: 0 % (ref 0.0–0.2)

## 2019-10-09 LAB — ACETAMINOPHEN LEVEL: Acetaminophen (Tylenol), Serum: <10 ug/mL — ABNORMAL LOW (ref 10–30)

## 2019-10-09 LAB — ETHANOL: Alcohol, Ethyl (B): <10 mg/dL (ref <10)

## 2019-10-09 LAB — SALICYLATE LEVEL: Salicylate Lvl: <7 mg/dL (ref 2.8–30.0)

## 2019-10-09 NOTE — ED Triage Notes (Signed)
Pt is A&Ox4 
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
## 2019-10-09 NOTE — ED Notes (Signed)
Pt can be heard talking to "someone", i.e. Jesus. States "he just called me a bastard".

## 2019-10-09 NOTE — ED Notes (Signed)
Patient belongings in locker #1

## 2019-10-10 DIAGNOSIS — F250 Schizoaffective disorder, bipolar type: Secondary | ICD-10-CM | POA: Diagnosis not present

## 2019-10-10 LAB — SARS CORONAVIRUS 2 (TAT 6-24 HRS): SARS Coronavirus 2: NEGATIVE

## 2019-10-10 LAB — RAPID URINE DRUG SCREEN, HOSP PERFORMED
Amphetamines: NOT DETECTED
Barbiturates: NOT DETECTED
Benzodiazepines: NOT DETECTED
Cocaine: NOT DETECTED
Opiates: NOT DETECTED
Tetrahydrocannabinol: NOT DETECTED

## 2019-10-10 MED ORDER — TEMAZEPAM 15 MG PO CAPS
30.0000 mg | ORAL_CAPSULE | Freq: Every day | ORAL | Status: DC
  Administered 2019-10-10: 30 mg via ORAL
  Filled 2019-10-10: qty 2

## 2019-10-10 MED ORDER — BENZTROPINE MESYLATE 1 MG PO TABS
1.0000 mg | ORAL_TABLET | Freq: Two times a day (BID) | ORAL | Status: DC
  Administered 2019-10-10 (×2): 1 mg via ORAL
  Filled 2019-10-10 (×2): qty 1

## 2019-10-10 MED ORDER — ILOPERIDONE 2 MG PO TABS
2.0000 mg | ORAL_TABLET | Freq: Two times a day (BID) | ORAL | Status: DC
  Administered 2019-10-10 (×2): 2 mg via ORAL
  Filled 2019-10-10 (×3): qty 1

## 2019-10-10 MED ORDER — RISPERIDONE 3 MG PO TABS
6.0000 mg | ORAL_TABLET | Freq: Every day | ORAL | Status: DC
  Administered 2019-10-10: 6 mg via ORAL
  Filled 2019-10-10: qty 2

## 2019-10-10 MED ORDER — SERTRALINE HCL 50 MG PO TABS
50.0000 mg | ORAL_TABLET | Freq: Every day | ORAL | Status: DC
  Administered 2019-10-10: 50 mg via ORAL
  Filled 2019-10-10: qty 1

## 2019-10-10 NOTE — Progress Notes (Signed)
CSW consult received. Pt has been psych cleared and is to be discharged. CSW will notify [facility] that their client was recently seen in the ED and psych cleared and to follow up with this patient. Pt also is active with an ACTT team in the community. CSW to email owner and notify them of patients ED visit and have them follow up.   [facility]  Clinical Social Worker  Ph: [phone]

## 2019-10-10 NOTE — Consult Note (Signed)
  Principal Problem: Paranoid schizophrenia [facility]) Discharge Diagnoses: Principal Problem:  Paranoid schizophrenia ([place])   Subjective: [name] [name], 43 y.o., female patient seen and assessed with Attending MD, chart reviewed on 10/10/19.  On evaluation patient is observed to be lying in bed, alert and oriented x 3. She is calm and cooperative, however will not tell writer her name. " [name] will not let me tell you my name." she is able to recall most current information including her CST member who will be picking her up.  Patient has a long standing history of schizophrenia and appears to be stable at this time.   During evaluation [name] [name] is alert/oriented x 4; calm/cooperative; and mood is congruent with affect.  She does not appear to be responding to internal/external stimuli or delusional thoughts at this time.  Patient denies suicidal/self-harm/homicidal ideation, psychosis, and paranoia.  Patient answered question appropriately.    Total Time spent with patient: 20 minutes  Past Psychiatric History: Schizophrenia  Disposition: Discharge home. Referral to ACT team services. Social worker to contact CST about discharge and coordinating of appropriate services for higher level of care such as ACT. No evidence of imminent risk to self or others at present.   Patient does not meet criteria for psychiatric inpatient admission. Supportive therapy provided about ongoing stressors. Discussed crisis plan, support from social network, calling 911, coming to the Emergency Department, and calling Suicide Hotline.

## 2019-10-10 NOTE — ED Notes (Signed)
Spoke with Dr. Eulis Foster about pt status-does he want to IVC pt if she wants to leave or can we let her go? This RN cannot get in touch with social work to see pt. Per Dr. Eulis Foster, we can let her go and not IVC her.

## 2019-10-10 NOTE — ED Notes (Signed)
Pt.s lunch has been ordered. 

## 2019-10-10 NOTE — ED Notes (Signed)
Pt yelling in room, pacing in room. This RN and sitter watching her. Acting as if there is someone in the room and she is talking to them or responding to internal stimuli. When asked if she is alright, pt stops and states yes or will not answer.
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
## 2019-10-10 NOTE — ED Notes (Signed)
Spoke with Nurse Practitioner explained patient yelled out twice very briefly currently calm cooperative currently not talking to her self however does talk to herself intermittently.

## 2019-10-10 NOTE — ED Notes (Signed)
Pt. Began screaming. When asked if everything was alright, she would respond with "I'm doing okay". RN notified.

## 2019-10-10 NOTE — ED Notes (Signed)
After TTS being completed patient had two yelling episodes lasting both 2 seconds. When nurse ask if everything okay patient responded "I doing okay" Calm cooperative when talking to nurse. When patient by herself she intermittently talk to self. Will notify [place] TTS.

## 2019-10-10 NOTE — ED Notes (Signed)
Sent [name] [name] a text page message on Epic. Awaiting response.

## 2019-10-10 NOTE — ED Notes (Signed)
Pt continues to yell in room. Making religious statements and accusations. Pt speaking very fast and very angry. When approached, pt changes instantly. Will not answer this RN though. Pt demands to know when she is going to be discharged. Spoke to Dr. Eulis Foster and he feels that pt should see social work and feels she should not be discharged.

## 2019-10-10 NOTE — ED Notes (Signed)
Refaxed IVC papers to [facility]

## 2019-10-10 NOTE — ED Notes (Signed)
Patient resting comfortably not talking to self at this time.

## 2019-10-10 NOTE — ED Notes (Addendum)
Attempted to reach social work at [phone], Boykin Peek.

## 2019-10-10 NOTE — Discharge Instructions (Addendum)
Follow up with your psychiatric care team as soon as possible.

## 2019-10-10 NOTE — BHH Counselor (Signed)
Pt consented for clinician to call Sharyn Lull her counselor in [place] from college. Pt reported, her counselors' information was in her chart. There is no contact information in the pt's chart for pt's college counselor. Clinician did not make contact.    Vertell Novak, [place], [facility], [facility] Triage Specialist [phone]

## 2019-10-10 NOTE — ED Notes (Signed)
Behavioral health called stated will perform TTS shortly. Notified patient and placed computer in room.

## 2019-10-10 NOTE — ED Notes (Signed)
Took over care of pt at 1900. Pt yelling out at times and heard in room yelling at someone. When asked who she was talking to, pt would not respond. When asked if she was ok, pt would not respond. Report given to this RN that pt was to be discharged per [facility] but Dr. Eulis Foster felt this was not the appropriate disposition for this pt. Dr. Eulis Foster placed social work consult for pt to be assessed.

## 2019-10-10 NOTE — ED Notes (Signed)
Patient having TTS now.

## 2019-10-10 NOTE — ED Notes (Signed)
Spoke with Behavioral Health TTS and explained what patient is doing. The Nurse Practitioner that spoke with patient via the computer helping another patient. Left nurse phone number to call back.

## 2019-10-11 NOTE — ED Notes (Signed)
Breakfast tray ordered 

## 2019-10-11 NOTE — ED Notes (Addendum)
Ate few bites of breakfast - ALL belongings - 4 labeled belongings bags, purse, and Home Meds - returned to pt - Pt verified all items present - D/C instructions given and questions answered to satisfaction - SW bringing pt cab voucher.

## 2019-10-11 NOTE — ED Notes (Addendum)
Dr. Eulis Foster and this RN spoke with the social worker. She will get in touch with the pts therapist and they will get in touch with her. Pt is good to be discharged. Pt has had all of her nighttime medications though, and is very sleepy. Will see if she wants to leave and how she is, when she wakes up.

## 2019-10-11 NOTE — Progress Notes (Signed)
CSW spoke with RN regarding consult. CSW noted patient will need a taxi voucher for discharge as they are not appropriate for bus transportation at this time. CSW provided RN with voucher. CSW signing off consult and will continue to follow for needed discharge supports.

## 2019-10-11 NOTE — ED Notes (Signed)
No Sitter available for pt per Staffing Office - Being monitored by staff.

## 2019-10-15 ENCOUNTER — Encounter (HOSPITAL_COMMUNITY): Payer: Self-pay | Admitting: Emergency Medicine

## 2019-10-15 ENCOUNTER — Other Ambulatory Visit: Payer: Self-pay

## 2019-10-15 ENCOUNTER — Emergency Department (HOSPITAL_COMMUNITY)
Admission: EM | Admit: 2019-10-15 | Discharge: 2019-10-16 | Disposition: A | Discharge status: Home / Self Care | Payer: Medicare Other | Source: Home / Self Care | Attending: Emergency Medicine | Admitting: Emergency Medicine

## 2019-10-15 ENCOUNTER — Emergency Department (HOSPITAL_COMMUNITY)
Admission: EM | Admit: 2019-10-15 | Discharge: 2019-10-15 | Disposition: A | Discharge status: Home / Self Care | Payer: Medicare Other | Attending: Emergency Medicine | Admitting: Emergency Medicine

## 2019-10-15 DIAGNOSIS — F209 Schizophrenia, unspecified: Secondary | ICD-10-CM | POA: Insufficient documentation

## 2019-10-15 DIAGNOSIS — F331 Major depressive disorder, recurrent, moderate: Secondary | ICD-10-CM | POA: Insufficient documentation

## 2019-10-15 DIAGNOSIS — F200 Paranoid schizophrenia: Secondary | ICD-10-CM | POA: Insufficient documentation

## 2019-10-15 DIAGNOSIS — Z79899 Other long term (current) drug therapy: Secondary | ICD-10-CM | POA: Insufficient documentation

## 2019-10-15 DIAGNOSIS — Z03818 Encounter for observation for suspected exposure to other biological agents ruled out: Secondary | ICD-10-CM | POA: Diagnosis not present

## 2019-10-15 DIAGNOSIS — Z5321 Procedure and treatment not carried out due to patient leaving prior to being seen by health care provider: Secondary | ICD-10-CM | POA: Insufficient documentation

## 2019-10-15 DIAGNOSIS — R45851 Suicidal ideations: Secondary | ICD-10-CM

## 2019-10-15 DIAGNOSIS — Z20828 Contact with and (suspected) exposure to other viral communicable diseases: Secondary | ICD-10-CM | POA: Insufficient documentation

## 2019-10-15 DIAGNOSIS — Z1330 Encounter for screening examination for mental health and behavioral disorders, unspecified: Secondary | ICD-10-CM | POA: Insufficient documentation

## 2019-10-15 LAB — CBC
HCT: 34.1 % — ABNORMAL LOW (ref 36.0–46.0)
Hemoglobin: 11.1 g/dL — ABNORMAL LOW (ref 12.0–15.0)
MCH: 28.3 pg (ref 26.0–34.0)
MCHC: 32.6 g/dL (ref 30.0–36.0)
MCV: 87 fL (ref 80.0–100.0)
Platelets: 266 10*3/uL (ref 150–400)
RBC: 3.92 MIL/uL (ref 3.87–5.11)
RDW: 13.5 % (ref 11.5–15.5)
WBC: 6.7 10*3/uL (ref 4.0–10.5)
nRBC: 0 % (ref 0.0–0.2)

## 2019-10-15 LAB — I-STAT BETA HCG BLOOD, ED (MC, WL, AP ONLY): I-stat hCG, quantitative: <5 m[IU]/mL (ref <5)

## 2019-10-15 NOTE — ED Provider Notes (Addendum)
Called to waiting room by nursing staff as this patient reporting she wants to leave and go to behvaioral health.  Patient reportedly told EMS she was "suicidal" to EMS, but reported no active suicidal ideation to me. She appeared to be fully oriented on my brief assessment, and did not appear grossly intoxicated or visibly medically ill. I believe she has a right to refuse service and was free to go to [facility]  Wyvonnia Dusky, MD 10/15/19 1559    Wyvonnia Dusky, MD 10/15/19 1600
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
## 2019-10-15 NOTE — ED Triage Notes (Signed)
Patient is complaining of being suicidal. Patient states that god told her to hit herself in the head. Patient states that she wants to hurt herself. Patient did not give any specific plans.

## 2019-10-15 NOTE — ED Triage Notes (Signed)
Per security, when pt found out that she cannot get a ride over to [facility], pt stating that she wanted to stay. Beginning to scream and act irrationally in lobby. Charge RN made aware, security made aware

## 2019-10-15 NOTE — ED Triage Notes (Signed)
Pt BIB [facility] for eval of reported suicidality. Pt activated EMS for SI. On arrival, pt gets out of wheelchair and states "I need to go to behavioral health". Explained to pt that because she was SI, she'd need to be cleared by a physician before leaving. Explained that EMS cannot transport her now to [facility] d/t this also being a hospital that provides medical and psychiatric services. MD Trifan up to triage to assess and reports that pt is able to leave and go over to [facility].

## 2019-10-15 NOTE — ED Triage Notes (Signed)
Pt now screaming in lobby and refusing to leave. Charge RN Roselyn Reef made aware. Per charge, ok for pt to be escorted out.

## 2019-10-16 ENCOUNTER — Other Ambulatory Visit: Payer: Self-pay

## 2019-10-16 ENCOUNTER — Encounter (HOSPITAL_COMMUNITY): Payer: Self-pay | Admitting: Behavioral Health

## 2019-10-16 ENCOUNTER — Inpatient Hospital Stay (HOSPITAL_COMMUNITY)
Admission: AD | Admit: 2019-10-16 | Discharge: 2019-10-21 | DRG: 885 | Disposition: A | Discharge status: Home / Self Care | Payer: Medicare Other | Source: Intra-hospital | Attending: Psychiatry | Admitting: Psychiatry

## 2019-10-16 DIAGNOSIS — R45851 Suicidal ideations: Secondary | ICD-10-CM | POA: Diagnosis present

## 2019-10-16 DIAGNOSIS — F419 Anxiety disorder, unspecified: Secondary | ICD-10-CM | POA: Diagnosis present

## 2019-10-16 DIAGNOSIS — Z20828 Contact with and (suspected) exposure to other viral communicable diseases: Secondary | ICD-10-CM | POA: Diagnosis present

## 2019-10-16 DIAGNOSIS — G4700 Insomnia, unspecified: Secondary | ICD-10-CM | POA: Diagnosis present

## 2019-10-16 DIAGNOSIS — F259 Schizoaffective disorder, unspecified: Principal | ICD-10-CM | POA: Diagnosis present

## 2019-10-16 DIAGNOSIS — F250 Schizoaffective disorder, bipolar type: Secondary | ICD-10-CM | POA: Diagnosis not present

## 2019-10-16 LAB — RAPID URINE DRUG SCREEN, HOSP PERFORMED
Amphetamines: NOT DETECTED
Barbiturates: NOT DETECTED
Benzodiazepines: NOT DETECTED
Cocaine: NOT DETECTED
Opiates: NOT DETECTED
Tetrahydrocannabinol: NOT DETECTED

## 2019-10-16 LAB — COMPREHENSIVE METABOLIC PANEL
ALT: 11 U/L (ref 0–44)
AST: 17 U/L (ref 15–41)
Albumin: 4.3 g/dL (ref 3.5–5.0)
Alkaline Phosphatase: 37 U/L — ABNORMAL LOW (ref 38–126)
Anion gap: 9 (ref 5–15)
BUN: 7 mg/dL (ref 6–20)
CO2: 24 mmol/L (ref 22–32)
Calcium: 9.4 mg/dL (ref 8.9–10.3)
Chloride: 102 mmol/L (ref 98–111)
Creatinine, Ser: 0.47 mg/dL (ref 0.44–1.00)
GFR calc Af Amer: >60 mL/min (ref >60)
GFR calc non Af Amer: >60 mL/min (ref >60)
Glucose, Bld: 102 mg/dL — ABNORMAL HIGH (ref 70–99)
Potassium: 3.2 mmol/L — ABNORMAL LOW (ref 3.5–5.1)
Sodium: 135 mmol/L (ref 135–145)
Total Bilirubin: 0.9 mg/dL (ref 0.3–1.2)
Total Protein: 7.2 g/dL (ref 6.5–8.1)

## 2019-10-16 LAB — ACETAMINOPHEN LEVEL: Acetaminophen (Tylenol), Serum: <10 ug/mL — ABNORMAL LOW (ref 10–30)

## 2019-10-16 LAB — ETHANOL: Alcohol, Ethyl (B): <10 mg/dL (ref <10)

## 2019-10-16 LAB — SALICYLATE LEVEL: Salicylate Lvl: <7 mg/dL (ref 2.8–30.0)

## 2019-10-16 LAB — POC SARS CORONAVIRUS 2 AG -  ED: SARS Coronavirus 2 Ag: NEGATIVE

## 2019-10-16 MED ORDER — INFLUENZA VAC SPLIT QUAD 0.5 ML IM SUSY
0.5000 mL | PREFILLED_SYRINGE | INTRAMUSCULAR | Status: DC
  Filled 2019-10-16: qty 0.5

## 2019-10-16 MED ORDER — TEMAZEPAM 15 MG PO CAPS
30.0000 mg | ORAL_CAPSULE | Freq: Every day | ORAL | Status: DC
  Administered 2019-10-16: 30 mg via ORAL
  Filled 2019-10-16: qty 2

## 2019-10-16 MED ORDER — RISPERIDONE 2 MG PO TABS
3.0000 mg | ORAL_TABLET | Freq: Two times a day (BID) | ORAL | Status: DC
  Filled 2019-10-16: qty 1

## 2019-10-16 MED ORDER — ACETAMINOPHEN 325 MG PO TABS
650.0000 mg | ORAL_TABLET | Freq: Four times a day (QID) | ORAL | Status: DC | PRN
  Administered 2019-10-16 – 2019-10-18 (×3): 650 mg via ORAL
  Filled 2019-10-16 (×3): qty 2

## 2019-10-16 MED ORDER — ILOPERIDONE 4 MG PO TABS
2.0000 mg | ORAL_TABLET | Freq: Two times a day (BID) | ORAL | Status: DC
  Administered 2019-10-16: 07:00:00 2 mg via ORAL
  Filled 2019-10-16 (×2): qty 1

## 2019-10-16 MED ORDER — HYDROXYZINE HCL 25 MG PO TABS
25.0000 mg | ORAL_TABLET | Freq: Three times a day (TID) | ORAL | Status: DC | PRN
  Administered 2019-10-18 – 2019-10-20 (×5): 25 mg via ORAL
  Filled 2019-10-16 (×6): qty 1

## 2019-10-16 MED ORDER — SERTRALINE HCL 50 MG PO TABS: 50.0000 mg | ORAL_TABLET | Freq: Every day | ORAL | Status: DC

## 2019-10-16 MED ORDER — BENZTROPINE MESYLATE 1 MG PO TABS
1.0000 mg | ORAL_TABLET | Freq: Two times a day (BID) | ORAL | Status: DC
  Administered 2019-10-16: 1 mg via ORAL
  Filled 2019-10-16: qty 1

## 2019-10-16 MED ORDER — ONDANSETRON 4 MG PO TBDP
4.0000 mg | ORAL_TABLET | Freq: Once | ORAL | Status: AC
  Administered 2019-10-16: 4 mg via ORAL
  Filled 2019-10-16: qty 1

## 2019-10-16 MED ORDER — ALUM & MAG HYDROXIDE-SIMETH 200-200-20 MG/5ML PO SUSP
30.0000 mL | ORAL | Status: DC | PRN
  Administered 2019-10-17: 30 mL via ORAL

## 2019-10-16 MED ORDER — TRAZODONE HCL 50 MG PO TABS: 50.0000 mg | ORAL_TABLET | Freq: Every evening | ORAL | Status: DC | PRN

## 2019-10-16 MED ORDER — MAGNESIUM HYDROXIDE 400 MG/5ML PO SUSP: 30.0000 mL | Freq: Every day | ORAL | Status: DC | PRN

## 2019-10-16 MED ORDER — SERTRALINE HCL 50 MG PO TABS
50.0000 mg | ORAL_TABLET | Freq: Every day | ORAL | Status: DC
  Administered 2019-10-16 – 2019-10-21 (×5): 50 mg via ORAL
  Filled 2019-10-16 (×3): qty 1
  Filled 2019-10-16: qty 7
  Filled 2019-10-16 (×5): qty 1

## 2019-10-16 MED ORDER — RISPERIDONE 2 MG PO TABS
4.0000 mg | ORAL_TABLET | Freq: Every day | ORAL | Status: DC
  Administered 2019-10-16: 4 mg via ORAL
  Filled 2019-10-16 (×2): qty 2

## 2019-10-16 MED ORDER — ILOPERIDONE 4 MG PO TABS
2.0000 mg | ORAL_TABLET | Freq: Two times a day (BID) | ORAL | Status: DC
  Administered 2019-10-16 – 2019-10-17 (×3): 2 mg via ORAL
  Filled 2019-10-16 (×5): qty 1

## 2019-10-16 NOTE — Progress Notes (Signed)
Patient ID: [name] [name], female   DOB: 02/09/1976, 43 y.o.   MRN: [id number]  Patient presents voluntarily complaining of increased depression and suicidal thoughts. Was recently discharged from observation unit. Presents with history of schizophrenia and not taking medications. Currently unable to precise her living arrangements. Thought process is disorganized. Patient is talking to self, preoccupied. No aggressive behaviors noted. Medical problems expressed. Skin assessment performed by this Probation officer, assisted by Will, RN. No issues noted.  Patient was admitted and oriented to the unit. Safety precautions initiated.

## 2019-10-16 NOTE — Tx Team (Signed)
Initial Treatment Plan 10/16/2019 9:58 PM [name] [name] [id number]    PATIENT STRESSORS: Marital or family conflict Medication change or noncompliance   PATIENT STRENGTHS: General fund of knowledge Motivation for treatment/growth   PATIENT IDENTIFIED PROBLEMS:  psychosis  SI  "depression"                 DISCHARGE CRITERIA:  Improved stabilization in mood, thinking, and/or behavior Verbal commitment to aftercare and medication compliance  PRELIMINARY DISCHARGE PLAN: Attend PHP/IOP Outpatient therapy  PATIENT/FAMILY INVOLVEMENT: This treatment plan has been presented to and reviewed with the patient, [name] [name].  The patient and family have been given the opportunity to ask questions and make suggestions.  Providence Crosby, RN 10/16/2019, 9:58 PM

## 2019-10-16 NOTE — Progress Notes (Signed)
Patient ID: [name] [name], female   DOB: 03-02-1976, 43 y.o.   MRN: [id number]  Patient is seen and examined.  Patient is a 43 year old female with a past psychiatric history significant for schizophrenia.  She is currently sedated secondary to medications making examination limited.  The patient presented to the [facility] emergency department on 10/15/2019.  The patient had reportedly told the emergency medical services staff that she was "suicidal".  She was taken to the hospital.  After evaluation in the emergency department she attempted to leave.  They were going to allow the patient to walk to the behavioral health hospital, but then became significantly irritated after she was unable to get a ride to the behavioral health hospital.  She began to scream and act irrationally in the lobby.  It is unclear but then she was assessed at the [facility] emergency department on 10/16/2019.  She was brought by EMS secondary to suicidal ideation.  She stated that someone was trying to kill her and when asked who that was she stated "[name]".  The patient's last psychiatric hospitalization was at our facility on 10/02/2019.  At that time she was having hyper religious delusions about Jesus as well.  She was noted to be having auditory and visual hallucinations as well as paranoid delusions.  She remained in the hospital until 10/03/2019.  She was discharged on Cogentin, Fanapt, Risperdal, sertraline and temazepam.  A full examination is difficult secondary to her oversedation.  Her medications have been restarted as previously written.  We will attempt to reassess her later today.  Hopefully her mental status will improve, and we will be able to treat her in the observation unit.  Review of her laboratories revealed a mildly low potassium at 3.2, mildly elevated glucose at 102.  Her hematocrit and hemoglobin were slightly low at 11.1 and 34.1.  Her beta-hCG was less than 5,  alcohol was less than 10, drug screen was completely negative.

## 2019-10-16 NOTE — ED Notes (Signed)
Spoke with Vassie Loll, RN, regarding patient acceptance to Observation Unit. Rapid COVID to be completed, after negative test patient will be transported to Observation Unit.

## 2019-10-16 NOTE — BH Assessment (Addendum)
[facility] Assessment Progress Note  Per Myles Lipps, MD, this voluntary pt requires psychiatric hospitalization at this time.  Heather, RN has assigned pt to [facility] Rm 506-2.  Pt's nurse, Louanne Skye, has been notified.  Jalene Mullet, [place] Coordinator [phone]

## 2019-10-16 NOTE — Progress Notes (Signed)
[facility] MD Progress Note  10/16/2019 3:38 PM [name] [name]  MRN:  [id number]  Subjective:  [name] [name], 43 y.o., female patient seen face to face by this provider, Dr. Mallie Darting; and chart reviewed on 10/16/19.  On evaluation [name] [name] reports that she has "a lot going on." When asked what brings her in, patient stated that she wanted to kill herself last night because she was waiting on a delivery that never arrived. "I got a new place and I was waiting on my washer, dryer, microwave and dishes to arrive."  When asked, if she hears voices, pt responds, "no just god, he tells me to hit myself sometimes, god is jealous of my beauty, smarts and cleanliness."   Pt reports  having command hallucinations and sleeping 3-4 hours a sleep at night despite currently being prescribed 52m of Risperdal at bedtime.    During evaluation [name] [name] alert/oriented x 4; calm/cooperative; and mood is congruent with affect.  She does respond to command auditory hallucinations and has delusional thoughts.  Patient denies suicidal/self-harm/homicidal ideation. Pt is actively  psychotic, and is experiencing paranoia (god is after her)  Patient answered question appropriately.    Principal Problem: Schizoaffective disorder ([place]den Diagnosis: Principal Problem:   Schizoaffective disorder (HSwartz  Total Time spent with patient: 30 minutes  Past Psychiatric History: Yes  Past Medical History:  Past Medical History:  Diagnosis Date  . Arthritis   . Depression    History reviewed. No pertinent surgical history. Family History: History reviewed. No pertinent family history. Family Psychiatric  History: unknown Social History:  Social History   Substance and Sexual Activity  Alcohol Use Not Currently   Comment: BAC not available     Social History   Substance and Sexual Activity  Drug Use Not Currently   Comment: UDS not available    Social History   Socioeconomic History  . Marital status: Single    Spouse  name: Not on file  . Number of children: Not on file  . Years of education: Not on file  . Highest education level: Not on file  Occupational History  . Occupation: Unknown  Tobacco Use  . Smoking status: Never Smoker  . Smokeless tobacco: Never Used  Substance and Sexual Activity  . Alcohol use: Not Currently    Comment: BAC not available  . Drug use: Not Currently    Comment: UDS not available  . Sexual activity: Not Currently  Other Topics Concern  . Not on file  Social History Narrative   [place] and that she lives alone.     Social Determinants of Health   Financial Resource Strain:   . Difficulty of Paying Living Expenses: Not on file  Food Insecurity:   . Worried About RCharity fundraiserin the Last Year: Not on file  . Ran Out of Food in the Last Year: Not on file  Transportation Needs:   . Lack of Transportation (Medical): Not on file  . Lack of Transportation (Non-Medical): Not on file  Physical Activity:   . Days of Exercise per Week: Not on file  . Minutes of Exercise per Session: Not on file  Stress:   . Feeling of Stress : Not on file  Social Connections:   . Frequency of Communication with Friends and Family: Not on file  . Frequency of Social Gatherings with Friends and Family: Not on file  . Attends Religious Services: Not on file  .  Active Member of Clubs or Organizations: Not on file  . Attends Archivist Meetings: Not on file  . Marital Status: Not on file   Additional Social History:                         Sleep: Poor  Appetite:  Good  Current Medications: Current Facility-Administered Medications  Medication Dose Route Frequency Provider Last Rate Last Admin  . acetaminophen (TYLENOL) tablet 650 mg  650 mg Oral Q6H PRN Anike, Adaku C, NP      . alum & mag hydroxide-simeth (MAALOX/MYLANTA) 200-200-20 MG/5ML suspension 30 mL  30 mL Oral Q4H PRN Anike, Adaku C, NP      . hydrOXYzine  (ATARAX/VISTARIL) tablet 25 mg  25 mg Oral TID PRN Anike, Adaku C, NP      . iloperidone (FANAPT) tablet 2 mg  2 mg Oral BID [name], MD   2 mg at 10/16/19 1236  . [START ON 10/17/2019] influenza vac split quadrivalent PF (FLUARIX) injection 0.5 mL  0.5 mL Intramuscular Tomorrow-1000 Rankin, Shuvon B, NP      . magnesium hydroxide (MILK OF MAGNESIA) suspension 30 mL  30 mL Oral Daily PRN Anike, Adaku C, NP      . risperiDONE (RISPERDAL) tablet 4 mg  4 mg Oral QHS [name], MD      . sertraline (ZOLOFT) tablet 50 mg  50 mg Oral Daily [name], MD   50 mg at 10/16/19 1236  . traZODone (DESYREL) tablet 50 mg  50 mg Oral QHS PRN Anike, Adaku C, NP        Lab Results:  Results for orders placed or performed during the hospital encounter of 10/15/19 (from the past 48 hour(s))  Rapid urine drug screen (hospital performed)     Status: None   Collection Time: 10/15/19 11:27 PM  Result Value Ref Range   Opiates NONE DETECTED NONE DETECTED   Cocaine NONE DETECTED NONE DETECTED   Benzodiazepines NONE DETECTED NONE DETECTED   Amphetamines NONE DETECTED NONE DETECTED   Tetrahydrocannabinol NONE DETECTED NONE DETECTED   Barbiturates NONE DETECTED NONE DETECTED    Comment: (NOTE) DRUG SCREEN FOR MEDICAL PURPOSES ONLY.  IF CONFIRMATION IS NEEDED FOR ANY PURPOSE, NOTIFY LAB WITHIN 5 DAYS. LOWEST DETECTABLE LIMITS FOR URINE DRUG SCREEN Drug Class                     Cutoff (ng/mL) Amphetamine and metabolites    1000 Barbiturate and metabolites    200 Benzodiazepine                 080 Tricyclics and metabolites     300 Opiates and metabolites        300 Cocaine and metabolites        300 THC                            50 Performed at [facility], [place] [address], [place], [place] [address]   Comprehensive metabolic panel     Status: Abnormal   Collection Time: 10/15/19 11:40 PM  Result Value Ref Range   Sodium 135 135 - 145 mmol/L   Potassium  3.2 (L) 3.5 - 5.1 mmol/L   Chloride 102 98 - 111 mmol/L   CO2 24 22 - 32 mmol/L   Glucose, Bld 102 (H) 70 - 99 mg/dL  BUN 7 6 - 20 mg/dL   Creatinine, Ser 0.47 0.44 - 1.00 mg/dL   Calcium 9.4 8.9 - 10.3 mg/dL   Total Protein 7.2 6.5 - 8.1 g/dL   Albumin 4.3 3.5 - 5.0 g/dL   AST 17 15 - 41 U/L   ALT 11 0 - 44 U/L   Alkaline Phosphatase 37 (L) 38 - 126 U/L   Total Bilirubin 0.9 0.3 - 1.2 mg/dL   GFR calc non Af Amer >60 >60 mL/min   GFR calc Af Amer >60 >60 mL/min   Anion gap 9 5 - 15    Comment: Performed at [facility], [place] [address], [place], [place] [address]  cbc     Status: Abnormal   Collection Time: 10/15/19 11:40 PM  Result Value Ref Range   WBC 6.7 4.0 - 10.5 K/uL   RBC 3.92 3.87 - 5.11 MIL/uL   Hemoglobin 11.1 (L) 12.0 - 15.0 g/dL   HCT 34.1 (L) 36.0 - 46.0 %   MCV 87.0 80.0 - 100.0 fL   MCH 28.3 26.0 - 34.0 pg   MCHC 32.6 30.0 - 36.0 g/dL   RDW 13.5 11.5 - 15.5 %   Platelets 266 150 - 400 K/uL   nRBC 0.0 0.0 - 0.2 %    Comment: Performed at [facility], [place] [address] [place], [place] [address]  Ethanol     Status: None   Collection Time: 10/15/19 11:41 PM  Result Value Ref Range   Alcohol, Ethyl (B) <10 <10 mg/dL    Comment: (NOTE) Lowest detectable limit for serum alcohol is 10 mg/dL. For medical purposes only. Performed at [facility], [place] [address], [place], [place] [address]   Salicylate level     Status: None   Collection Time: 10/15/19 11:41 PM  Result Value Ref Range   Salicylate Lvl <9.1 2.8 - 30.0 mg/dL    Comment: Performed at [facility], [place] [address], [place], [place] [address]  Acetaminophen level     Status: Abnormal   Collection Time: 10/15/19 11:41 PM  Result Value Ref Range   Acetaminophen (Tylenol), Serum <10 (L) 10 - 30 ug/mL    Comment: (NOTE) Therapeutic concentrations vary significantly. A range of 10-30 ug/mL  may be an effective concentration for  many patients. However, some  are best treated at concentrations outside of this range. Acetaminophen concentrations >150 ug/mL at 4 hours after ingestion  and >50 ug/mL at 12 hours after ingestion are often associated with  toxic reactions. Performed at [facility], [place] [address], [place], [place] [address]   I-Stat beta hCG blood, ED     Status: None   Collection Time: 10/15/19 11:45 PM  Result Value Ref Range   I-stat hCG, quantitative <5.0 <5 mIU/mL   Comment 3            Comment:   GEST. AGE      CONC.  (mIU/mL)   <=1 WEEK        5 - 50     2 WEEKS       50 - 500     3 WEEKS       100 - 10,000     4 WEEKS     1,000 - 30,000        FEMALE AND NON-PREGNANT FEMALE:     LESS THAN 5 mIU/mL   POC SARS Coronavirus 2 Ag-ED - Nasal Swab (BD Veritor  Kit)     Status: None   Collection Time: 10/16/19  6:21 AM  Result Value Ref Range   SARS Coronavirus 2 Ag NEGATIVE NEGATIVE    Comment: (NOTE) SARS-CoV-2 antigen NOT DETECTED.  Negative results are presumptive.  Negative results do not preclude SARS-CoV-2 infection and should not be used as the sole basis for treatment or other patient management decisions, including infection  control decisions, particularly in the presence of clinical signs and  symptoms consistent with COVID-19, or in those who have been in contact with the virus.  Negative results must be combined with clinical observations, patient history, and epidemiological information. The expected result is Negative. Fact Sheet for Patients: PodPark.tn Fact Sheet for Healthcare Providers: GiftContent.is This test is not yet approved or cleared by the [place] FDA and  has been authorized for detection and/or diagnosis of SARS-CoV-2 by FDA under an Emergency Use Authorization (EUA).  This EUA will remain in effect (meaning this test can be used) for the duration of  the COVID-19 de claration  under Section 564(b)(1) of the Act, 21 U.S.C. section 360bbb-3(b)(1), unless the authorization is terminated or revoked sooner.     Blood Alcohol level:  Lab Results  Component Value Date   ETH <10 10/15/2019   ETH <10 50/93/2671    Metabolic Disorder Labs: Lab Results  Component Value Date   HGBA1C 4.8 08/06/2019   MPG 91.06 08/06/2019   MPG 88.19 06/26/2018   Lab Results  Component Value Date   PROLACTIN 28.9 (H) 06/26/2018   Lab Results  Component Value Date   CHOL 160 09/19/2019   TRIG 52 09/19/2019   HDL 46 09/19/2019   CHOLHDL 3.5 09/19/2019   VLDL 10 09/19/2019   LDLCALC 104 (H) 09/19/2019   [place] 90 08/06/2019    Physical Findings: AIMS:  , ,  ,  ,    CIWA:    COWS:     Musculoskeletal: Strength & Muscle Tone: within normal limits Gait & Station: normal Patient leans: N/A  Psychiatric Specialty Exam: Physical Exam  Nursing note and vitals reviewed. Constitutional: She is oriented to person, place, and time. She appears well-developed.  Respiratory: Effort normal.  Musculoskeletal:        General: Normal range of motion.     Cervical back: Normal range of motion.  Neurological: She is alert and oriented to person, place, and time.  Skin: Skin is dry.  Psychiatric: She has a normal mood and affect. Her speech is normal and behavior is normal. Thought content is delusional. Cognition and memory are impaired. She expresses inappropriate judgment.    Review of Systems  There were no vitals taken for this visit.There is no height or weight on file to calculate BMI.  General Appearance: Casual  Eye Contact:  Good  Speech:  Normal Rate  Volume:  Normal  Mood:  Euthymic  Affect:  Flat  Thought Process:  Disorganized and Descriptions of Associations: Intact  Orientation:  Full (Time, Place, and Person)  Thought Content:  Delusions and Hallucinations: Auditory Command:  god sometimes tells her to hurt herself  Suicidal Thoughts:  No  Homicidal  Thoughts:  No  Memory:  Immediate;   Fair  Judgement:  Impaired  Insight:  Lacking  Psychomotor Activity:  Normal  Concentration:  Concentration: Fair  Recall:  [employer] of Knowledge:  Fair  Language:  Fair  Akathisia:  NA  Handed:  Right  AIMS (if indicated):     Assets:  Social Support  ADL's:  Intact  Cognition:  Impaired,  Mild  Sleep:        Treatment Plan Summary: Medication management and Plan Admit Patient to 500 hall as soon as bed is available for possible medication adjustment and psychiatric stabilization.  Deloria Lair, NP 10/16/2019, 3:38 PM

## 2019-10-16 NOTE — ED Notes (Signed)
[name] [name], patient accepted to [facility] Observation Unit  Pending resulted COVID test Arrival time, after resulted COVID test Room 407 Bed 1 Attending is Dr. Dwyane Dee. Report [phone] or [phone]

## 2019-10-16 NOTE — Progress Notes (Signed)
Pt brought from the observation unit 400 hall.
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
## 2019-10-17 DIAGNOSIS — F250 Schizoaffective disorder, bipolar type: Secondary | ICD-10-CM

## 2019-10-17 MED ORDER — BENZTROPINE MESYLATE 0.5 MG PO TABS
0.5000 mg | ORAL_TABLET | Freq: Two times a day (BID) | ORAL | Status: DC
  Administered 2019-10-17 – 2019-10-21 (×8): 0.5 mg via ORAL
  Filled 2019-10-17: qty 1
  Filled 2019-10-17 (×2): qty 14
  Filled 2019-10-17 (×11): qty 1

## 2019-10-17 MED ORDER — ILOPERIDONE 2 MG PO TABS
2.0000 mg | ORAL_TABLET | Freq: Three times a day (TID) | ORAL | Status: DC
  Administered 2019-10-17 – 2019-10-21 (×11): 2 mg via ORAL
  Filled 2019-10-17: qty 1
  Filled 2019-10-17: qty 21
  Filled 2019-10-17 (×4): qty 1
  Filled 2019-10-17: qty 21
  Filled 2019-10-17 (×7): qty 1
  Filled 2019-10-17: qty 21
  Filled 2019-10-17 (×3): qty 1

## 2019-10-17 MED ORDER — TRAZODONE HCL 100 MG PO TABS
200.0000 mg | ORAL_TABLET | Freq: Every day | ORAL | Status: DC
  Filled 2019-10-17 (×4): qty 2

## 2019-10-17 MED ORDER — RISPERIDONE 3 MG PO TABS
3.0000 mg | ORAL_TABLET | Freq: Two times a day (BID) | ORAL | Status: DC
  Administered 2019-10-17 – 2019-10-21 (×8): 3 mg via ORAL
  Filled 2019-10-17 (×14): qty 1

## 2019-10-17 NOTE — Progress Notes (Signed)
Per patient request, CSW called patient's [facility] TCLI coordinator, Eliezer Mccoy [phone]). Shelton Silvas is aware that patient is currently hospitalized, patient was supposed to have furniture/appliances delivered to her apartment through [facility] today. Shelton Silvas requested patient call her back immediately, request to call and contact information was provided to the patient.  Stephanie Acre, MSW, [place] Social Worker [facility] Adult Unit  [phone]
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
## 2019-10-17 NOTE — BHH Suicide Risk Assessment (Addendum)
[facility] Admission Suicide Risk Assessment   Nursing information obtained from:  Patient Demographic factors:  Low socioeconomic status, Unemployed, Living alone Current Mental Status:  Suicidal ideation indicated by patient Loss Factors:  Decrease in vocational status Historical Factors:  Family history of mental illness or substance abuse Risk Reduction Factors:  NA  Total Time spent with patient: 45 minutes Principal Problem: Schizoaffective disorder ([place]) Diagnosis:  Principal Problem:   Schizoaffective disorder ([place])  Subjective Data: readmitted for psychosis   Continued Clinical Symptoms:  Alcohol Use Disorder Identification Test Final Score (AUDIT): 0 The "Alcohol Use Disorders Identification Test", Guidelines for Use in Primary Care, Second Edition.  World Pharmacologist [facility]). Score between 0-7:  no or low risk or alcohol related problems. Score between 8-15:  moderate risk of alcohol related problems. Score between 16-19:  high risk of alcohol related problems. Score 20 or above:  warrants further diagnostic evaluation for alcohol dependence and treatment.   CLINICAL FACTORS: schizoaffective chronic delusions   Musculoskeletal: Strength & Muscle Tone: within normal limits Gait & Station: normal Patient leans: N/A  Psychiatric Specialty Exam: Physical Exam  Nursing note and vitals reviewed. Constitutional: She appears well-developed and well-nourished.  Cardiovascular: Normal rate and regular rhythm.    Review of Systems  Constitutional: Negative.   HENT: Negative.   Eyes: Negative.   Respiratory: Negative.   Cardiovascular: Negative.   Endocrine: Negative.   Genitourinary: Negative.   Allergic/Immunologic: Negative.   Neurological: Negative.   Hematological: Negative.     Blood pressure 111/69, pulse 98, temperature 97.7 F (36.5 C), temperature source Oral, resp. rate 16, height 4\' 10"  (1.473 m), weight 48.1 kg, SpO2 100 %.Body mass index is 22.15 kg/m.   General Appearance: Casual  Eye Contact:  Fair  Speech:  Pressured  Volume:  Decreased  Mood:  Irritable and labile  Affect:  Labile  Thought Process:  Descriptions of Associations: Loose  Orientation:  Full (Time, Place, and Person)  Thought Content:  Delusions and Hallucinations: Auditory  Suicidal Thoughts:  No  Homicidal Thoughts:  No  Memory:  Immediate;   Poor Recent;   Fair Remote;   Fair  Judgement:  Impaired  Insight:  Shallow  Psychomotor Activity:  Normal  Concentration:  Concentration: Fair and Attention Span: Poor  Recall:  [employer] of Knowledge:  Fair  Language:  Fair  Akathisia:  Negative  Handed:  Right  AIMS (if indicated):     Assets:  Leisure Time Physical Health Resilience  ADL's:  Intact  Cognition:  WNL  Sleep:  Number of Hours: 7.5      COGNITIVE FEATURES THAT CONTRIBUTE TO RISK:  Polarized thinking    SUICIDE RISK:   Minimal: No identifiable suicidal ideation.  Patients presenting with no risk factors but with morbid ruminations; may be classified as minimal risk based on the severity of the depressive symptoms  PLAN OF CARE: admit for med adjustements  I certify that inpatient services furnished can reasonably be expected to improve the patient's condition.   [name], MD 10/17/2019, 10:45 AM

## 2019-10-17 NOTE — BHH Suicide Risk Assessment (Signed)
[place] INPATIENT:  Family/Significant Other Suicide Prevention Education  Suicide Prevention Education:  Patient Refusal for Family/Significant Other Suicide Prevention Education: The patient [name] [name] has refused to provide written consent for family/significant other to be provided Family/Significant Other Suicide Prevention Education during admission and/or prior to discharge.  Physician notified.  Joellen Jersey 10/17/2019, 1:21 PM

## 2019-10-17 NOTE — Tx Team (Signed)
Interdisciplinary Treatment and Diagnostic Plan Update  10/17/2019 Time of Session: 10:00am [name] [name] MRN: [id number]  Principal Diagnosis: Schizoaffective disorder [name])  Secondary Diagnoses: Principal Problem:   Schizoaffective disorder ([name])   Current Medications:  Current Facility-Administered Medications  Medication Dose Route Frequency Provider Last Rate Last Admin  . acetaminophen (TYLENOL) tablet 650 mg  650 mg Oral Q6H PRN Anike, Adaku C, NP   650 mg at 10/16/19 2003  . alum & mag hydroxide-simeth (MAALOX/MYLANTA) 200-200-20 MG/5ML suspension 30 mL  30 mL Oral Q4H PRN Anike, Adaku C, NP   30 mL at 10/17/19 0806  . benztropine (COGENTIN) tablet 0.5 mg  0.5 mg Oral BID [name], MD      . hydrOXYzine (ATARAX/VISTARIL) tablet 25 mg  25 mg Oral TID PRN Anike, Adaku C, NP      . iloperidone (FANAPT) tablet 2 mg  2 mg Oral TID [name], MD      . influenza vac split quadrivalent PF (FLUARIX) injection 0.5 mL  0.5 mL Intramuscular Tomorrow-1000 Rankin, Shuvon B, NP      . magnesium hydroxide (MILK OF MAGNESIA) suspension 30 mL  30 mL Oral Daily PRN Anike, Adaku C, NP      . risperiDONE (RISPERDAL) tablet 3 mg  3 mg Oral BID [name], MD      . sertraline (ZOLOFT) tablet 50 mg  50 mg Oral Daily [name], MD   50 mg at 10/17/19 0806  . traZODone (DESYREL) tablet 200 mg  200 mg Oral QHS [name], MD       PTA Medications: Medications Prior to Admission  Medication Sig Dispense Refill Last Dose  . benzonatate (TESSALON) 100 MG capsule Take 1 capsule (100 mg total) by mouth 3 (three) times daily as needed for cough. (Patient not taking: Reported on 10/10/2019) 20 capsule 0   . benztropine (COGENTIN) 1 MG tablet Take 1 tablet (1 mg total) by mouth 2 (two) times daily. (Patient not taking: Reported on 10/16/2019) 60 tablet 3   . iloperidone (FANAPT) 4 MG TABS tablet Take 0.5 tablets (2 mg total) by mouth 2 (two) times daily. (Patient taking differently: Take 4 mg  by mouth every morning. ) 30 tablet 2   . risperiDONE (RISPERDAL) 3 MG tablet Take 2 tablets (6 mg total) by mouth at bedtime. (Patient taking differently: Take 3 mg by mouth at bedtime. ) 60 tablet 2   . sertraline (ZOLOFT) 50 MG tablet Take 1 tablet (50 mg total) by mouth daily. 30 tablet 0   . temazepam (RESTORIL) 30 MG capsule Take 1 capsule (30 mg total) by mouth at bedtime. (Patient not taking: Reported on 10/10/2019) 30 capsule 1     Patient Stressors: Marital or family conflict Medication change or noncompliance  Patient Strengths: Technical sales engineer for treatment/growth  Treatment Modalities: Medication Management, Group therapy, Case management,  1 to 1 session with clinician, Psychoeducation, Recreational therapy.   Physician Treatment Plan for Primary Diagnosis: Schizoaffective disorder ([place]) Long Term Goal(s):     Short Term Goals:    Medication Management: Evaluate patient's response, side effects, and tolerance of medication regimen.  Therapeutic Interventions: 1 to 1 sessions, Unit Group sessions and Medication administration.  Evaluation of Outcomes: Not Met  Physician Treatment Plan for Secondary Diagnosis: Principal Problem:   Schizoaffective disorder ([place])  Long Term Goal(s):     Short Term Goals:       Medication Management: Evaluate patient's response, side effects, and tolerance  of medication regimen.  Therapeutic Interventions: 1 to 1 sessions, Unit Group sessions and Medication administration.  Evaluation of Outcomes: Not Met   RN Treatment Plan for Primary Diagnosis: Schizoaffective disorder ([place]) Long Term Goal(s): Knowledge of disease and therapeutic regimen to maintain health will improve  Short Term Goals: Ability to participate in decision making will improve, Ability to identify and develop effective coping behaviors will improve and Compliance with prescribed medications will improve  Medication Management: RN will  administer medications as ordered by provider, will assess and evaluate patient's response and provide education to patient for prescribed medication. RN will report any adverse and/or side effects to prescribing provider.  Therapeutic Interventions: 1 on 1 counseling sessions, Psychoeducation, Medication administration, Evaluate responses to treatment, Monitor vital signs and CBGs as ordered, Perform/monitor CIWA, COWS, AIMS and Fall Risk screenings as ordered, Perform wound care treatments as ordered.  Evaluation of Outcomes: Not Met   LCSW Treatment Plan for Primary Diagnosis: Schizoaffective disorder ([place]) Long Term Goal(s): Safe transition to appropriate next level of care at discharge, Engage patient in therapeutic group addressing interpersonal concerns.  Short Term Goals: Engage patient in aftercare planning with referrals and resources, Increase social support, Increase emotional regulation, Identify triggers associated with mental health/substance abuse issues and Increase skills for wellness and recovery  Therapeutic Interventions: Assess for all discharge needs, 1 to 1 time with Social worker, Explore available resources and support systems, Assess for adequacy in community support network, Educate family and significant other(s) on suicide prevention, Complete Psychosocial Assessment, Interpersonal group therapy.  Evaluation of Outcomes: Not Met  Progress in Treatment: Attending groups: No. New to unit.  Participating in groups: No. Taking medication as prescribed: Yes. Toleration medication: Yes. Family/Significant other contact made: No, will contact:  supports if consents are granted. Patient understands diagnosis: Yes. Discussing patient identified problems/goals with staff: Yes. Medical problems stabilized or resolved: No. Denies suicidal/homicidal ideation: No. Issues/concerns per patient self-inventory: Yes.  New problem(s) identified: Yes, Describe:  patient unsure if  she wants to return to her home.  New Short Term/Long Term Goal(s): medication management for mood stabilization; elimination of SI thoughts; development of comprehensive mental wellness/sobriety plan.  Patient Goals:    Discharge Plan or Barriers: CSW assessing for a safe and appropriate discharge plan. Patient has her own apartment and is current with [facility] for ACTT services.  Reason for Continuation of Hospitalization: Delusions  Depression Hallucinations Suicidal ideation  Estimated Length of Stay: 5-7 days  Attendees: Patient: 10/17/2019 10:45 AM  Physician: Dr.Farah 10/17/2019 10:45 AM  Nursing: Benjamine Mola, RN 10/17/2019 10:45 AM  RN Care Manager: 10/17/2019 10:45 AM  Social Worker: Stephanie Acre, [place] 10/17/2019 10:45 AM  Recreational Therapist:  10/17/2019 10:45 AM  Other:  10/17/2019 10:45 AM  Other:  10/17/2019 10:45 AM  Other: 10/17/2019 10:45 AM    Scribe for Treatment Team: Joellen Jersey, LCSWA 10/17/2019 10:45 AM
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
## 2019-10-17 NOTE — Progress Notes (Signed)
   10/17/19 2100  Psych Admission Type (Psych Patients Only)  Admission Status Voluntary  Psychosocial Assessment  Patient Complaints Anxiety  Eye Contact Brief  Facial Expression Anxious  Affect Anxious;Depressed;Preoccupied  Speech Rapid;Tangential  Interaction Forwards little  Motor Activity Restless  Appearance/Hygiene Disheveled;In scrubs  Behavior Characteristics Anxious  Mood Anxious  Thought Process  Coherency Circumstantial  Content Delusions;Obsessions;Paranoia  Delusions Paranoid  Perception Derealization;Hallucinations  Hallucination Auditory  Judgment Impaired  Confusion Mild  Danger to Self  Current suicidal ideation? Denies  Self-Injurious Behavior No self-injurious ideation or behavior indicators observed or expressed   Agreement Not to Harm Self Yes  Description of Agreement verbal  Danger to Others  Danger to Others None reported or observed   Pt kept to herself this evening. Pt continues to be responding to internal stimuli and paranoid

## 2019-10-17 NOTE — Progress Notes (Signed)
Recreation Therapy Notes  Date: 12.11.20 Time: 1000 Location: 500 Hall Dayroom  Group Topic: Self-Esteem  Goal Area(s) Addresses:  Patient will successfully identify positive attributes about themselves.  Patient will successfully identify benefit of improved self-esteem.   Behavioral Response: Engaged  Intervention: Visual merchandiser, colored pencils, music  Activity: Personalized License Plate.  Patients were to create a personalized license plate that focused on the good things about themselves.  Patients could also highlight important dates, accomplishments or things hoped to accomplish.  Education:  Self-Esteem, Dentist.   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.   Victorino Sparrow, LRT/CTRS         Victorino Sparrow A 10/17/2019 11:48 AM
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
## 2019-10-18 MED ORDER — TRAZODONE HCL 50 MG PO TABS
50.0000 mg | ORAL_TABLET | Freq: Every evening | ORAL | Status: DC | PRN
  Administered 2019-10-18 – 2019-10-19 (×2): 50 mg via ORAL
  Filled 2019-10-18 (×2): qty 1

## 2019-10-18 NOTE — Plan of Care (Signed)
  Problem: Coping: Goal: Ability to demonstrate self-control will improve Outcome: Not Progressing   Problem: Safety: Goal: Periods of time without injury will increase Outcome: Progressing   Problem: Coping: Goal: Coping ability will improve Outcome: Not Progressing   Problem: Medication: Goal: Compliance with prescribed medication regimen will improve Outcome: Progressing

## 2019-10-18 NOTE — Progress Notes (Signed)
[facility] Group Notes:  (Nursing/MHT/Case Management/Adjunct)  Date:  10/18/2019  Time:  0900  Type of Therapy:  Nurse Education  Art Painting Group   Participation Level:  Did Not Attend 

## 2019-10-18 NOTE — Progress Notes (Signed)
Patient ID: [name] [name], female   DOB: 10/02/1976, 43 y.o.   MRN: [id number]  [place] NOVEL CORONAVIRUS (COVID-19) DAILY CHECK-OFF SYMPTOMS - answer yes or no to each - every day NO YES  Have you had a fever in the past 24 hours?  . Fever (Temp > 37.80C / 100F) X   Have you had any of these symptoms in the past 24 hours? . New Cough .  Sore Throat  .  Shortness of Breath .  Difficulty Breathing .  Unexplained Body Aches   X   Have you had any one of these symptoms in the past 24 hours not related to allergies?   . Runny Nose .  Nasal Congestion .  Sneezing   X   If you have had runny nose, nasal congestion, sneezing in the past 24 hours, has it worsened?  X   EXPOSURES - check yes or no X   Have you traveled outside the state in the past 14 days?  X   Have you been in contact with someone with a confirmed diagnosis of COVID-19 or PUI in the past 14 days without wearing appropriate PPE?  X   Have you been living in the same home as a person with confirmed diagnosis of COVID-19 or a PUI (household contact)?    X   Have you been diagnosed with COVID-19?    X              What to do next: Answered NO to all: Answered YES to anything:   Proceed with unit schedule Follow the [facility] Inpatient Flowsheet.

## 2019-10-18 NOTE — Progress Notes (Addendum)
[name] MD Progress Note  10/18/2019 11:48 AM [name]  MRN:  [id number] Subjective:  [name] [name] found lying in bed. She appears fatigued and presents with minimal speech. Per prior notes she presented to the hospital reporting that [name] was trying to kill her. She was restarted on Fanapt, Risperdal, and Zoloft yesterday. She admits to paranoia that someone is trying to hurt her but is vague on further assessment. She is oriented x 3. She admits to recent SI but denies current SI and contracts for safety. Denies HI/AVH and shows no signs of responding to internal stimuli. She has been isolating to her room. She is asking about getting into a program at [name].  From admission H&P: This is the latest of numerous psychiatric admissions and healthcare system encounters for this 43 year old chronic schizoaffective patient, presenting multiple times in recent days and weeks with various complaints-more recently she presented with acute psychosis, yelling at unseen others, and reporting variable delusions.  Principal Problem: Schizoaffective disorder ([place]) Diagnosis: Principal Problem:   Schizoaffective disorder ([place])  Total Time spent with patient: 15 minutes  Past Psychiatric History: See admission H&P  Past Medical History:  Past Medical History:  Diagnosis Date  . Arthritis   . Depression    History reviewed. No pertinent surgical history. Family History: History reviewed. No pertinent family history. Family Psychiatric  History: See admission H&P Social History:  Social History   Substance and Sexual Activity  Alcohol Use Not Currently   Comment: BAC not available     Social History   Substance and Sexual Activity  Drug Use Not Currently   Comment: UDS not available    Social History   Socioeconomic History  . Marital status: Single    Spouse name: Not on file  . Number of children: Not on file  . Years of education: Not on file  . Highest education level:  Not on file  Occupational History  . Occupation: Unknown  Tobacco Use  . Smoking status: Never Smoker  . Smokeless tobacco: Never Used  Substance and Sexual Activity  . Alcohol use: Not Currently    Comment: BAC not available  . Drug use: Not Currently    Comment: UDS not available  . Sexual activity: Not Currently  Other Topics Concern  . Not on file  Social History Narrative   Pt stated that she lives in [place], and that she lives alone.     Social Determinants of Health   Financial Resource Strain:   . Difficulty of Paying Living Expenses: Not on file  Food Insecurity:   . Worried About Charity fundraiser in the Last Year: Not on file  . Ran Out of Food in the Last Year: Not on file  Transportation Needs:   . Lack of Transportation (Medical): Not on file  . Lack of Transportation (Non-Medical): Not on file  Physical Activity:   . Days of Exercise per Week: Not on file  . Minutes of Exercise per Session: Not on file  Stress:   . Feeling of Stress : Not on file  Social Connections:   . Frequency of Communication with Friends and Family: Not on file  . Frequency of Social Gatherings with Friends and Family: Not on file  . Attends Religious Services: Not on file  . Active Member of Clubs or Organizations: Not on file  . Attends Archivist Meetings: Not on file  . Marital Status: Not on file   Additional Social History:  Sleep: Good  Appetite:  Good  Current Medications: Current Facility-Administered Medications  Medication Dose Route Frequency Provider Last Rate Last Admin  . acetaminophen (TYLENOL) tablet 650 mg  650 mg Oral Q6H PRN Anike, Adaku C, NP   650 mg at 10/18/19 0816  . alum & mag hydroxide-simeth (MAALOX/MYLANTA) 200-200-20 MG/5ML suspension 30 mL  30 mL Oral Q4H PRN Anike, Adaku C, NP   30 mL at 10/17/19 0806  . benztropine (COGENTIN) tablet 0.5 mg  0.5 mg Oral BID Malvin JohnsFarah, Brian, MD   0.5 mg at 10/18/19  0801  . hydrOXYzine (ATARAX/VISTARIL) tablet 25 mg  25 mg Oral TID PRN Anike, Adaku C, NP   25 mg at 10/18/19 0802  . iloperidone (FANAPT) tablet 2 mg  2 mg Oral TID Malvin JohnsFarah, Brian, MD   2 mg at 10/18/19 1148  . influenza vac split quadrivalent PF (FLUARIX) injection 0.5 mL  0.5 mL Intramuscular Tomorrow-1000 Rankin, Shuvon B, NP      . magnesium hydroxide (MILK OF MAGNESIA) suspension 30 mL  30 mL Oral Daily PRN Anike, Adaku C, NP      . risperiDONE (RISPERDAL) tablet 3 mg  3 mg Oral BID Malvin JohnsFarah, Brian, MD   3 mg at 10/18/19 0802  . sertraline (ZOLOFT) tablet 50 mg  50 mg Oral Daily Antonieta Pertlary, Greg Lawson, MD   50 mg at 10/18/19 0802  . traZODone (DESYREL) tablet 200 mg  200 mg Oral QHS Malvin JohnsFarah, Brian, MD        Lab Results: No results found for this or any previous visit (from the past 48 hour(s)).  Blood Alcohol level:  Lab Results  Component Value Date   ETH <10 10/15/2019   ETH <10 10/09/2019    Metabolic Disorder Labs: Lab Results  Component Value Date   HGBA1C 4.8 08/06/2019   MPG 91.06 08/06/2019   MPG 88.19 06/26/2018   Lab Results  Component Value Date   PROLACTIN 28.9 (H) 06/26/2018   Lab Results  Component Value Date   CHOL 160 09/19/2019   TRIG 52 09/19/2019   HDL 46 09/19/2019   CHOLHDL 3.5 09/19/2019   VLDL 10 09/19/2019   LDLCALC 104 (H) 09/19/2019   LDLCALC 90 08/06/2019    Physical Findings: AIMS: Facial and Oral Movements Muscles of Facial Expression: None, normal Lips and Perioral Area: None, normal Jaw: None, normal Tongue: None, normal,Extremity Movements Upper (arms, wrists, hands, fingers): None, normal Lower (legs, knees, ankles, toes): None, normal, Trunk Movements Neck, shoulders, hips: None, normal, Overall Severity Severity of abnormal movements (highest score from questions above): None, normal Incapacitation due to abnormal movements: None, normal Patient's awareness of abnormal movements (rate only patient's report): No Awareness, Dental  Status Current problems with teeth and/or dentures?: No Does patient usually wear dentures?: No  CIWA:    COWS:     Musculoskeletal: Strength & Muscle Tone: within normal limits Gait & Station: normal Patient leans: N/A  Psychiatric Specialty Exam: Physical Exam  Nursing note and vitals reviewed. Constitutional: She is oriented to person, place, and time. She appears well-developed and well-nourished.  Cardiovascular: Normal rate.  Respiratory: Effort normal.  Neurological: She is alert and oriented to person, place, and time.    Review of Systems  Constitutional: Negative.   Respiratory: Negative for cough and shortness of breath.   Gastrointestinal: Negative for nausea and vomiting.  Psychiatric/Behavioral: Negative for behavioral problems, hallucinations, self-injury, sleep disturbance and suicidal ideas.    Blood pressure (!) 140/123, pulse 93, temperature 98.2  F (36.8 C), temperature source Oral, resp. rate 16, height 4\' 10"  (1.473 m), weight 48.1 kg, SpO2 100 %.Body mass index is 22.15 kg/m.  General Appearance: Disheveled  Eye Contact:  Minimal  Speech:  Slow  Volume:  Decreased  Mood:  Depressed  Affect:  Congruent  Thought Process:  Descriptions of Associations: Tangential  Orientation:  Full (Time, Place, and Person)  Thought Content:  Delusions and Paranoid Ideation  Suicidal Thoughts:  No  Homicidal Thoughts:  No  Memory:  Immediate;   Fair Recent;   Fair  Judgement:  Impaired  Insight:  Lacking  Psychomotor Activity:  Decreased  Concentration:  Concentration: Fair and Attention Span: Fair  Recall:  of Knowledge:  Fair  Language:  Fair  Akathisia:  No  Handed:  Right  AIMS (if indicated):     Assets:  Desire for Improvement Leisure Time Resilience  ADL's:  Intact  Cognition:  WNL  Sleep:  Number of Hours: 10.25     Treatment Plan Summary: Daily contact with patient to assess and evaluate symptoms and progress in treatment and  Medication management   Continue inpatient hospitalization.  Continue Fanapt 2 mg PO TID for psychosis Continue Risperdal 3 mg PO BID for psychosis Continue Cogentin 0.5 mg PO BID for EPS Continue Vistaril 25 mg PO TID PRN anxiety Continue Zoloft 50 mg PO daily for mood Decrease trazodone to 50 mg PO QHS PRN insomnia, may repeat x1 PRN  Patient will participate in the therapeutic group milieu.  Discharge disposition in progress.   [employer], NP 10/18/2019, 11:48 AM   Agree with NP note

## 2019-10-19 MED ORDER — TRAZODONE HCL 100 MG PO TABS
100.0000 mg | ORAL_TABLET | Freq: Every evening | ORAL | Status: DC | PRN
  Administered 2019-10-19 – 2019-10-20 (×2): 100 mg via ORAL
  Filled 2019-10-19 (×2): qty 1

## 2019-10-19 NOTE — Plan of Care (Signed)
Progress note  D: pt found in bed; compliant with medication administration. Pt is minimal during assessment. Pt denies any physical complaints or pain. Pt seems to be responding to internal stimuli. Pt is focused on discharge. Pt is pleasant. Pt denies si/hi/vh and verbally agrees to approach staff if these become apparent or before harming themself/others while at [facility].  A: Pt provided support and encouragement. Pt given medication per protocol and standing orders. Q42m safety checks implemented and continued.  R: Pt safe on the unit. Will continue to monitor.  Pt progressing in the following metrics  Problem: Education: Goal: Knowledge of [facility] Education information/materials will improve Outcome: Progressing Goal: Verbalization of understanding the information provided will improve Outcome: Progressing   Problem: Activity: Goal: Sleeping patterns will improve Outcome: Progressing

## 2019-10-19 NOTE — Progress Notes (Signed)
   10/18/19 2045  Psych Admission Type (Psych Patients Only)  Admission Status Voluntary  Psychosocial Assessment  Patient Complaints None  Eye Contact Brief  Facial Expression Flat  Affect Flat  Speech Soft  Interaction Minimal  Motor Activity Slow  Appearance/Hygiene Disheveled;In scrubs  Behavior Characteristics Cooperative;Appropriate to situation  Mood Depressed;Pleasant  Thought Process  Coherency Circumstantial  Content Paranoia;Delusions;Obsessions  Delusions Paranoid  Perception Hallucinations;Derealization  Hallucination Auditory  Judgment Impaired  Confusion Mild  Danger to Self  Current suicidal ideation? Denies  Self-Injurious Behavior No self-injurious ideation or behavior indicators observed or expressed   Agreement Not to Harm Self Yes  Description of Agreement verbally contracts for safety  Danger to Others  Danger to Others None reported or observed

## 2019-10-19 NOTE — Progress Notes (Signed)
Patient has been restless even after taking medications to aid with rest. She is heard from her room talking loudly or making noises that can be heard at nursing station. When Probation officer or mht go to her room she is lying in bed with covers over her as if nothing has happened. Wi;; continue to monitor

## 2019-10-19 NOTE — Progress Notes (Addendum)
[facility] MD Progress Note  10/19/2019 11:45 AM [name]  MRN:  [id number] Subjective:  "I'm alright."  [name] found in the dayroom. She is more alert and verbal today. She reports difficulty sleeping overnight. Per nursing report she was awake and heard talking to herself overnight. 2.75 hours of sleep recorded. She reports that she had come to the hospital because "God told me to slap myself in the face" but denies feeling this way anymore. She denies SI/HI/AVH. She is med compliant and denies medication side effects. She shows no signs of responding to internal stimuli. No paranoid or delusional thought content expressed.  From admission H&P: This is the latest of numerous psychiatric admissions and healthcare system encounters for this 43 year old chronic schizoaffective patient, presenting multiple times in recent days and weeks with various complaints-more recently she presented with acute psychosis, yelling at unseen others, and reporting variable delusions.  Principal Problem: Schizoaffective disorder ([place]) Diagnosis: Principal Problem:   Schizoaffective disorder ([place])  Total Time spent with patient: 15 minutes  Past Psychiatric History: See admission H&P  Past Medical History:  Past Medical History:  Diagnosis Date  . Arthritis   . Depression    History reviewed. No pertinent surgical history. Family History: History reviewed. No pertinent family history. Family Psychiatric  History: See admission H&P  Social History:  Social History   Substance and Sexual Activity  Alcohol Use Not Currently   Comment: BAC not available     Social History   Substance and Sexual Activity  Drug Use Not Currently   Comment: UDS not available    Social History   Socioeconomic History  . Marital status: Single    Spouse name: Not on file  . Number of children: Not on file  . Years of education: Not on file  . Highest education level: Not on file  Occupational History  .  Occupation: Unknown  Tobacco Use  . Smoking status: Never Smoker  . Smokeless tobacco: Never Used  Substance and Sexual Activity  . Alcohol use: Not Currently    Comment: BAC not available  . Drug use: Not Currently    Comment: UDS not available  . Sexual activity: Not Currently  Other Topics Concern  . Not on file  Social History Narrative   Pt stated that she lives in [place], and that she lives alone.     Social Determinants of Health   Financial Resource Strain:   . Difficulty of Paying Living Expenses: Not on file  Food Insecurity:   . Worried About Charity fundraiser in the Last Year: Not on file  . Ran Out of Food in the Last Year: Not on file  Transportation Needs:   . Lack of Transportation (Medical): Not on file  . Lack of Transportation (Non-Medical): Not on file  Physical Activity:   . Days of Exercise per Week: Not on file  . Minutes of Exercise per Session: Not on file  Stress:   . Feeling of Stress : Not on file  Social Connections:   . Frequency of Communication with Friends and Family: Not on file  . Frequency of Social Gatherings with Friends and Family: Not on file  . Attends Religious Services: Not on file  . Active Member of Clubs or Organizations: Not on file  . Attends Archivist Meetings: Not on file  . Marital Status: Not on file   Additional Social History:  Sleep: Poor  Appetite:  Good  Current Medications: Current Facility-Administered Medications  Medication Dose Route Frequency Provider Last Rate Last Admin  . acetaminophen (TYLENOL) tablet 650 mg  650 mg Oral Q6H PRN Anike, Adaku C, NP   650 mg at 10/18/19 2045  . alum & mag hydroxide-simeth (MAALOX/MYLANTA) 200-200-20 MG/5ML suspension 30 mL  30 mL Oral Q4H PRN Anike, Adaku C, NP   30 mL at 10/17/19 0806  . benztropine (COGENTIN) tablet 0.5 mg  0.5 mg Oral BID Malvin JohnsFarah, Brian, MD   0.5 mg at 10/19/19 [id number]  . hydrOXYzine (ATARAX/VISTARIL)  tablet 25 mg  25 mg Oral TID PRN Anike, Adaku C, NP   25 mg at 10/19/19 0117  . iloperidone (FANAPT) tablet 2 mg  2 mg Oral TID Malvin JohnsFarah, Brian, MD   2 mg at 10/19/19 1143  . influenza vac split quadrivalent PF (FLUARIX) injection 0.5 mL  0.5 mL Intramuscular Tomorrow-1000 Rankin, Shuvon B, NP      . magnesium hydroxide (MILK OF MAGNESIA) suspension 30 mL  30 mL Oral Daily PRN Anike, Adaku C, NP      . risperiDONE (RISPERDAL) tablet 3 mg  3 mg Oral BID Malvin JohnsFarah, Brian, MD   3 mg at 10/19/19 [id number]  . sertraline (ZOLOFT) tablet 50 mg  50 mg Oral Daily Antonieta Pertlary, Greg Lawson, MD   50 mg at 10/19/19 [id number]  . traZODone (DESYREL) tablet 100 mg  100 mg Oral QHS PRN,MR X 1 Aldean BakerSykes, Janet E, NP        Lab Results: No results found for this or any previous visit (from the past 48 hour(s)).  Blood Alcohol level:  Lab Results  Component Value Date   ETH <10 10/15/2019   ETH <10 10/09/2019    Metabolic Disorder Labs: Lab Results  Component Value Date   HGBA1C 4.8 08/06/2019   MPG 91.06 08/06/2019   MPG 88.19 06/26/2018   Lab Results  Component Value Date   PROLACTIN 28.9 (H) 06/26/2018   Lab Results  Component Value Date   CHOL 160 09/19/2019   TRIG 52 09/19/2019   HDL 46 09/19/2019   CHOLHDL 3.5 09/19/2019   VLDL 10 09/19/2019   LDLCALC 104 (H) 09/19/2019   LDLCALC 90 08/06/2019    Physical Findings: AIMS: Facial and Oral Movements Muscles of Facial Expression: None, normal Lips and Perioral Area: None, normal Jaw: None, normal Tongue: None, normal,Extremity Movements Upper (arms, wrists, hands, fingers): None, normal Lower (legs, knees, ankles, toes): None, normal, Trunk Movements Neck, shoulders, hips: None, normal, Overall Severity Severity of abnormal movements (highest score from questions above): None, normal Incapacitation due to abnormal movements: None, normal Patient's awareness of abnormal movements (rate only patient's report): No Awareness, Dental Status Current problems with  teeth and/or dentures?: No Does patient usually wear dentures?: No  CIWA:    COWS:     Musculoskeletal: Strength & Muscle Tone: within normal limits Gait & Station: normal Patient leans: N/A  Psychiatric Specialty Exam: Physical Exam  Nursing note and vitals reviewed. Constitutional: She is oriented to person, place, and time. She appears well-developed and well-nourished.  Cardiovascular: Normal rate.  Respiratory: Effort normal.  Neurological: She is alert and oriented to person, place, and time.    Review of Systems  Constitutional: Negative.   Respiratory: Negative for cough and shortness of breath.   Psychiatric/Behavioral: Positive for sleep disturbance. Negative for agitation, behavioral problems, dysphoric mood, self-injury and suicidal ideas.    Blood pressure 112/68, pulse 93, temperature 98  F (36.7 C), temperature source Oral, resp. rate 16, height 4\' 10"  (1.473 m), weight 48.1 kg, SpO2 100 %.Body mass index is 22.15 kg/m.  General Appearance: Casual  Eye Contact:  Fair  Speech:  Normal Rate  Volume:  Decreased  Mood:  Euthymic  Affect:  Blunt  Thought Process:  Coherent  Orientation:  Full (Time, Place, and Person)  Thought Content:  Logical  Suicidal Thoughts:  No  Homicidal Thoughts:  No  Memory:  Immediate;   Fair Recent;   Fair  Judgement:  Intact  Insight:  Fair  Psychomotor Activity:  Normal  Concentration:  Concentration: Fair and Attention Span: Fair  Recall:  of Knowledge:  Fair  Language:  Fair  Akathisia:  No  Handed:  Right  AIMS (if indicated):     Assets:  Communication Skills Desire for Improvement Housing Resilience Social Support  ADL's:  Intact  Cognition:  WNL  Sleep:  Number of Hours: 2.75     Treatment Plan Summary: Daily contact with patient to assess and evaluate symptoms and progress in treatment and Medication management   Continue inpatient hospitalization.  Increase trazodone to 100 mg PO QHS, may  repeat x1 PRN insomnia Continue Fanapt 2 mg PO TID for psychosis Continue Risperdal 3 mg PO BID for psychosis Continue Cogentin 0.5 mg PO BID for EPS Continue Vistaril 25 mg PO TID PRN anxiety Continue Zoloft 50 mg PO daily for mood  Patient will participate in the therapeutic group milieu.  Discharge disposition in progress.   [employer], NP 10/19/2019, 11:45 AM   Agree with NP note

## 2019-10-19 NOTE — Progress Notes (Signed)
[facility] Group Notes:  (Nursing/MHT/Case Management/Adjunct)  Date:  10/19/2019  Time:  900  Type of Therapy:  Nurse Education [employer] for 2021  Participation Level:  Active  Participation Quality:  Appropriate  Affect:  Appropriate  Cognitive:  Appropriate  Insight:  Appropriate  Engagement in Group:  Engaged  Modes of Intervention:  Activity  Summary of Progress/Problems:  Gor Vestal L  

## 2019-10-20 MED ORDER — LORAZEPAM 2 MG/ML IJ SOLN
INTRAMUSCULAR | Status: AC
  Administered 2019-10-20: 16:00:00
  Filled 2019-10-20: qty 1

## 2019-10-20 MED ORDER — HALOPERIDOL LACTATE 5 MG/ML IJ SOLN
INTRAMUSCULAR | Status: AC
  Administered 2019-10-20: 16:00:00
  Filled 2019-10-20: qty 1

## 2019-10-20 NOTE — Progress Notes (Signed)
   10/20/19 2200  Psych Admission Type (Psych Patients Only)  Admission Status Voluntary  Psychosocial Assessment  Patient Complaints None  Eye Contact Brief  Facial Expression Anxious;Pensive;Worried  Affect Preoccupied  Soil scientist;Soft  Interaction Cautious;Childlike;Forwards little;Guarded;Minimal  Motor Activity Fidgety;Slow  Appearance/Hygiene In scrubs  Behavior Characteristics Cooperative  Mood Anxious  Thought Process  Coherency Circumstantial;Flight of ideas  Content Preoccupation;Paranoia  Delusions Paranoid  Perception Hallucinations  Hallucination Auditory  Judgment Impaired  Confusion Mild  Danger to Self  Current suicidal ideation? Denies  Self-Injurious Behavior No self-injurious ideation or behavior indicators observed or expressed   Agreement Not to Harm Self Yes  Description of Agreement verbally contracts for safety  Danger to Others  Danger to Others None reported or observed   Pt tries to split staff with wanting people to look things up for her and getting things out of her locker

## 2019-10-20 NOTE — Progress Notes (Signed)
   10/19/19 2130  Psychosocial Assessment  Patient Complaints None  Eye Contact Brief  Facial Expression Anxious  Affect Preoccupied  Speech Logical/coherent;Soft  Interaction Cautious;Minimal  Motor Activity Fidgety  Appearance/Hygiene In scrubs  Behavior Characteristics Cooperative;Appropriate to situation  Mood Anxious;Pleasant  Thought Process  Coherency Circumstantial;Flight of ideas  Content Preoccupation;Paranoia  Delusions Paranoid  Perception Hallucinations  Hallucination Auditory  Judgment Impaired  Confusion Mild  Danger to Self  Current suicidal ideation? Denies  Self-Injurious Behavior No self-injurious ideation or behavior indicators observed or expressed   Agreement Not to Harm Self Yes  Description of Agreement verbally contracts for safety  Danger to Others  Danger to Others None reported or observed

## 2019-10-20 NOTE — Progress Notes (Signed)
Recreation Therapy Notes  Date: 12.14.20 Time: 1000 Location: [address] Dayroom  Group Topic: Triggers  Goal Area(s) Addresses:  Patient will effectively identify triggers.  Patient will identify strategies to avoid/reduce exposure to triggers. Patient will identify strategies to deal with triggers head on.  Intervention:  Worksheet, pencils  Activity: Triggers.  Patients were to identify their biggest triggers, ways to deal with triggers head on and ways to avoid triggers when they can't be avoided.  Education: Communication, Discharge Planning  Education Outcome: Acknowledges understanding/In group clarification offered/Needs additional education.   Clinical Observations/Feedback:  Pt did not attend group.   Victorino Sparrow, LRT/CTRS         Victorino Sparrow A 10/20/2019 12:04 PM

## 2019-10-20 NOTE — [facility] Group Notes (Signed)
LCSW Group Therapy Notes  Type of Therapy and Topic: Group Therapy: Core Beliefs  Participation Level: Active  Description of Group: In this group patients will be encouraged to explore their negative and positive core beliefs about themselves, others, and the world. Each patient will be challenged to identify these beliefs and ways to challenge negative core beliefs. This group will be process-oriented, with patients participating in exploration of their own experiences as well as giving and receiving support and challenge from other group members.  Therapeutic Goals: 1. Patient will identify personal core beliefs, both negative and positive. 2. Patient will identify core beliefs relating to others, both negative and positive. 3. Patient will challenge their negative beliefs about themselves and others. 4. Patient will identify three changes they can make to replace negative core beliefs with positive beliefs.  Summary of Patient Progress  Due to the COVID-19 pandemic, this group has been supplemented with worksheets.  Therapeutic Modalities: Cognitive Behavioral Therapy Solution Focused Therapy Motivational Interviewing  

## 2019-10-20 NOTE — Progress Notes (Signed)
[facility] MD Progress Note  10/20/2019 11:06 AM [name] [name]  MRN:  [id number] Subjective:   [name] [name] is in bed she is calm she denies current hallucinations  No previously expressed delusional material elaborated today but she is guarded today and less talkative no EPS or TD denies wanting to harm self or others may be close to her baseline status discussed with team Principal Problem: Schizoaffective disorder (HCC) Diagnosis: Principal Problem:   Schizoaffective disorder (HCC)  Total Time spent with patient: 20 minutes  Past Psychiatric History: See eval  Past Medical History:  Past Medical History:  Diagnosis Date  . Arthritis   . Depression    History reviewed. No pertinent surgical history. Family History: History reviewed. No pertinent family history. Family Psychiatric  History: See eval Social History:  Social History   Substance and Sexual Activity  Alcohol Use Not Currently   Comment: BAC not available     Social History   Substance and Sexual Activity  Drug Use Not Currently   Comment: UDS not available    Social History   Socioeconomic History  . Marital status: Single    Spouse name: Not on file  . Number of children: Not on file  . Years of education: Not on file  . Highest education level: Not on file  Occupational History  . Occupation: Unknown  Tobacco Use  . Smoking status: Never Smoker  . Smokeless tobacco: Never Used  Substance and Sexual Activity  . Alcohol use: Not Currently    Comment: BAC not available  . Drug use: Not Currently    Comment: UDS not available  . Sexual activity: Not Currently  Other Topics Concern  . Not on file  Social History Narrative   Pt stated that she lives in [place], and that she lives alone.     Social Determinants of Health   Financial Resource Strain:   . Difficulty of Paying Living Expenses: Not on file  Food Insecurity:   . Worried About Programme researcher, broadcasting/film/video in the Last Year: Not on file  . Ran Out of  Food in the Last Year: Not on file  Transportation Needs:   . Lack of Transportation (Medical): Not on file  . Lack of Transportation (Non-Medical): Not on file  Physical Activity:   . Days of Exercise per Week: Not on file  . Minutes of Exercise per Session: Not on file  Stress:   . Feeling of Stress : Not on file  Social Connections:   . Frequency of Communication with Friends and Family: Not on file  . Frequency of Social Gatherings with Friends and Family: Not on file  . Attends Religious Services: Not on file  . Active Member of Clubs or Organizations: Not on file  . Attends Banker Meetings: Not on file  . Marital Status: Not on file   Additional Social History:                         Sleep: Fair  Appetite:  Fair  Current Medications: Current Facility-Administered Medications  Medication Dose Route Frequency Provider Last Rate Last Admin  . acetaminophen (TYLENOL) tablet 650 mg  650 mg Oral Q6H PRN Anike, Adaku C, NP   650 mg at 10/18/19 2045  . alum & mag hydroxide-simeth (MAALOX/MYLANTA) 200-200-20 MG/5ML suspension 30 mL  30 mL Oral Q4H PRN Anike, Adaku C, NP   30 mL at 10/17/19 0806  . benztropine (COGENTIN) tablet 0.5  mg  0.5 mg Oral BID [name], MD   0.5 mg at 10/19/19 1630  . hydrOXYzine (ATARAX/VISTARIL) tablet 25 mg  25 mg Oral TID PRN Anike, Adaku C, NP   25 mg at 10/19/19 2127  . iloperidone (FANAPT) tablet 2 mg  2 mg Oral TID [name], MD   2 mg at 10/19/19 1630  . influenza vac split quadrivalent PF (FLUARIX) injection 0.5 mL  0.5 mL Intramuscular Tomorrow-1000 Rankin, Shuvon B, NP      . magnesium hydroxide (MILK OF MAGNESIA) suspension 30 mL  30 mL Oral Daily PRN Anike, Adaku C, NP      . risperiDONE (RISPERDAL) tablet 3 mg  3 mg Oral BID [name], MD   3 mg at 10/19/19 1630  . sertraline (ZOLOFT) tablet 50 mg  50 mg Oral Daily [name], MD   50 mg at 10/19/19 9528  . traZODone (DESYREL) tablet 100 mg  100 mg Oral  QHS PRN,MR X 1 Connye Burkitt, NP   100 mg at 10/19/19 2128    Lab Results: No results found for this or any previous visit (from the past 7 hour(s)).  Blood Alcohol level:  Lab Results  Component Value Date   ETH <10 10/15/2019   ETH <10 41/32/4401    Metabolic Disorder Labs: Lab Results  Component Value Date   HGBA1C 4.8 08/06/2019   MPG 91.06 08/06/2019   MPG 88.19 06/26/2018   Lab Results  Component Value Date   PROLACTIN 28.9 (H) 06/26/2018   Lab Results  Component Value Date   CHOL 160 09/19/2019   TRIG 52 09/19/2019   HDL 46 09/19/2019   CHOLHDL 3.5 09/19/2019   VLDL 10 09/19/2019   LDLCALC 104 (H) 09/19/2019   LDLCALC 90 08/06/2019    Physical Findings: AIMS: Facial and Oral Movements Muscles of Facial Expression: None, normal Lips and Perioral Area: None, normal Jaw: None, normal Tongue: None, normal,Extremity Movements Upper (arms, wrists, hands, fingers): None, normal Lower (legs, knees, ankles, toes): None, normal, Trunk Movements Neck, shoulders, hips: None, normal, Overall Severity Severity of abnormal movements (highest score from questions above): None, normal Incapacitation due to abnormal movements: None, normal Patient's awareness of abnormal movements (rate only patient's report): No Awareness, Dental Status Current problems with teeth and/or dentures?: No Does patient usually wear dentures?: No  CIWA:    COWS:     Musculoskeletal: Strength & Muscle Tone: within normal limits Gait & Station: normal Patient leans: N/A  Psychiatric Specialty Exam: Physical Exam  Review of Systems  Blood pressure 112/68, pulse 93, temperature 98 F (36.7 C), temperature source Oral, resp. rate 16, height 4\' 10"  (1.473 m), weight 48.1 kg, SpO2 100 %.Body mass index is 22.15 kg/m.  General Appearance: Casual  Eye Contact:  Fair  Speech:  Slow  Volume:  Decreased  Mood:  Dysphoric  Affect:  Congruent  Thought Process:  Linear and Descriptions of  Associations: Circumstantial  Orientation:  Full (Time, Place, and Person)  Thought Content:  Denies hallucinations  Suicidal Thoughts:  No  Homicidal Thoughts:  No  Memory:  Immediate;   Fair Recent;   Fair Remote;   Fair  Judgement:  Fair  Insight:  Fair  Psychomotor Activity:  Normal  Concentration:  Concentration: Fair and Attention Span: Fair  Recall:  [employer] of Knowledge:  Fair  Language:  Negative  Akathisia:  Negative  Handed:  Right  AIMS (if indicated):     Assets:  Communication Skills Desire for Improvement  ADL's:  Intact  Cognition:  WNL  Sleep:  Number of Hours: 5     Treatment Plan Summary: Daily contact with patient to assess and evaluate symptoms and progress in treatment and Medication management  Continue current antipsychotic therapy reality based therapy no change in precautions continue to monitor under 15-minute checks continue to discuss standard risk-benefit side effects and warnings with medication  Courtney Bellizzi, MD 10/20/2019, 11:06 AM

## 2019-10-21 MED ORDER — RISPERIDONE 4 MG PO TABS: 4.0000 mg | ORAL_TABLET | Freq: Two times a day (BID) | ORAL | 2 refills | Status: DC

## 2019-10-21 MED ORDER — RISPERIDONE 2 MG PO TABS
4.0000 mg | ORAL_TABLET | Freq: Two times a day (BID) | ORAL | Status: DC
  Filled 2019-10-21 (×3): qty 28

## 2019-10-21 MED ORDER — ILOPERIDONE 4 MG PO TABS: 2.0000 mg | ORAL_TABLET | Freq: Three times a day (TID) | ORAL | 2 refills | Status: DC

## 2019-10-21 MED ORDER — TRAZODONE HCL 150 MG PO TABS
150.0000 mg | ORAL_TABLET | Freq: Every evening | ORAL | Status: DC | PRN
  Filled 2019-10-21 (×2): qty 14

## 2019-10-21 MED ORDER — TRAZODONE HCL 150 MG PO TABS: 150.0000 mg | ORAL_TABLET | Freq: Every evening | ORAL | 1 refills | Status: DC | PRN

## 2019-10-21 MED ORDER — BENZTROPINE MESYLATE 1 MG PO TABS: 1.0000 mg | ORAL_TABLET | Freq: Two times a day (BID) | ORAL | 3 refills | Status: DC

## 2019-10-21 NOTE — Progress Notes (Signed)
  [facility] Adult Case Management Discharge Plan :  Will you be returning to the same living situation after discharge:  Yes, home. At discharge, do you have transportation home?: Yes,  Lyft at 11:00am Do you have the ability to pay for your medications: Yes,  Medicare.  Release of information consent forms completed and in the chart; letter on chart. Patient to Follow up at: Follow-up Information    [facility]. Go on 10/22/2019.   Why: Your hospital discharge appointment with Dr.Alphonzo is scheduled for 10/22/2019 at 9:00am. Please bring your hospital discharge paperwork with you and your current medications.  Contact information: [address] [place], [place] [address] Phone: [phone] Fax: [phone]          Next level of care provider has access to [place] and Suicide Prevention discussed: Yes,  with patient. Patient declined consents.  Have you used any form of tobacco in the last 30 days? (Cigarettes, Smokeless Tobacco, Cigars, and/or Pipes): No  Has patient been referred to the Quitline?: N/A patient is not a smoker  Patient has been referred for addiction treatment: Yes  Joellen Jersey, [place] 10/21/2019, 10:44 AM

## 2019-10-21 NOTE — Progress Notes (Signed)
Recreation Therapy Notes  INPATIENT RECREATION TR PLAN  Patient Details Name: [name] [name] MRN: [id number] DOB: [birth year] Today's Date: 10/21/2019  Rec Therapy Plan Is patient appropriate for Therapeutic Recreation?: Yes Treatment times per week: about 3 days Estimated Length of Stay: 5-7 days TR Treatment/Interventions: Group participation (Comment)  Discharge Criteria Pt will be discharged from therapy if:: Discharged Treatment plan/goals/alternatives discussed and agreed upon by:: Patient/family  Discharge Summary Short term goals set: See patient care plan Short term goals met: Adequate for discharge Progress toward goals comments: Groups attended Which groups?: Wellness, Self-esteem Reason goals not met: None Therapeutic equipment acquired: N/A Reason patient discharged from therapy: Discharge from hospital Pt/family agrees with progress & goals achieved: Yes Date patient discharged from therapy: 10/21/19    Victorino Sparrow, LRT/CTRS  Ria Comment, Glorious Flicker A 10/21/2019, 10:56 AM

## 2019-10-21 NOTE — Progress Notes (Signed)
Patient ID: [name] [name], female   DOB: 1976-09-18, 43 y.o.   MRN: [id number] Patient discharged to home/self care on her own accord.  Patient denies SI, HI and AVH upon discharge.  Patient acknowledged understanding of all discharge instructions and receipt of personal belongings.

## 2019-10-21 NOTE — BHH Suicide Risk Assessment (Signed)
[facility] Discharge Suicide Risk Assessment   Principal Problem: Schizoaffective disorder [facility]) Discharge Diagnoses: Principal Problem:   Schizoaffective disorder ([place])   Total Time spent with patient: 45 minutes Musculoskeletal: Strength & Muscle Tone: within normal limits Gait & Station: normal Patient leans: N/A  Psychiatric Specialty Exam: Physical Exam  Review of Systems  Blood pressure 112/68, pulse 93, temperature 98 F (36.7 C), temperature source Oral, resp. rate 16, height 4\' 10"  (1.473 m), weight 48.1 kg, SpO2 100 %.Body mass index is 22.15 kg/m.  General Appearance: Casual  Eye Contact:  Fair  Speech:  Slow  Volume:  Decreased  Mood:  Euthymic  Affect:  Congruent  Thought Process:  Goal Directed and Descriptions of Associations: Tangential  Orientation:  Full (Time, Place, and Person)  Thought Content:  Denies hallucinations  Suicidal Thoughts:  No  Homicidal Thoughts:  No  Memory:  Immediate;   Fair Recent;   Fair Remote;   Fair  Judgement:  Fair  Insight:  Fair  Psychomotor Activity:  Normal  Concentration:  Concentration: Fair and Attention Span: Fair  Recall:  [employer] of Knowledge:  Fair  Language:  Poor  Akathisia:  Negative  Handed:  Right  AIMS (if indicated):     Assets:  Physical Health Resilience Social Support  ADL's:  Intact  Cognition:  WNL  Sleep:  Number of Hours: 8.5    Mental Status Per Nursing Assessment::   On Admission:  Suicidal ideation indicated by patient  Demographic Factors:  Low socioeconomic status  Loss Factors: Decrease in vocational status  Historical Factors: Impulsivity  Risk Reduction Factors:   Sense of responsibility to family, Religious beliefs about death and Living with another person, especially a relative  Continued Clinical Symptoms:  Schizophrenia:   Paranoid or undifferentiated type  Cognitive Features That Contribute To Risk:  Loss of executive function    Suicide Risk:  Minimal: No  identifiable suicidal ideation.  Patients presenting with no risk factors but with morbid ruminations; may be classified as minimal risk based on the severity of the depressive symptoms  Follow-up Information    [facility]. Go on 10/22/2019.   Why: Your hospital discharge appointment with Dr.Alphonzo is scheduled for 10/22/2019 at 9:00am. Please bring your hospital discharge paperwork with you and your current medications.  Contact information: [address], [place], [place] [address] Phone: [phone] Fax: [phone]          Plan Of Care/Follow-up recommendations:  Activity:  full  Jaelin Devincentis, MD 10/21/2019, 10:51 AM

## 2019-10-21 NOTE — Plan of Care (Signed)
Pt was able to identify some coping strategies at completion of recreation therapy group sessions.   [name] [name], LRT/CTRS 

## 2019-10-21 NOTE — Progress Notes (Signed)
Recreation Therapy Notes  Date: 12.15.20 Time: 0945 Location: [address] Dayroom  Group Topic: Wellness  Goal Area(s) Addresses:  Patient will define components of whole wellness. Patient will verbalize benefit of whole wellness.  Behavioral Response: Minimal  Intervention: Music    Activity:  Exercise.  LRT led patients in a series of stretches to get them loosened up.  Patients then led the group in exercises of their choosing.  Patients were to give at least 30 minutes of exercise.  Patients were also encouraged to get water and take breaks when needed.  Patients were also encouraged to pay attention to any aches or pain they may experience and skip those exercises.  Education: Wellness, Dentist.   Education Outcome: Acknowledges education/In group clarification offered/Needs additional education.   Clinical Observations/Feedback: Pt was bright and completed the first round of exercises.  Pt observed for the remainder of group.  Pt was appropriate while in group.    Victorino Sparrow, LRT/CTRS   Victorino Sparrow A 10/21/2019 10:44 AM
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
## 2019-11-06 ENCOUNTER — Emergency Department (HOSPITAL_COMMUNITY)
Admission: EM | Admit: 2019-11-06 | Discharge: 2019-11-07 | Disposition: A | Discharge status: Home / Self Care | Payer: Medicare Other | Attending: Emergency Medicine | Admitting: Emergency Medicine

## 2019-11-06 DIAGNOSIS — F250 Schizoaffective disorder, bipolar type: Secondary | ICD-10-CM | POA: Insufficient documentation

## 2019-11-06 DIAGNOSIS — Z590 Homelessness: Secondary | ICD-10-CM | POA: Insufficient documentation

## 2019-11-06 DIAGNOSIS — R45851 Suicidal ideations: Secondary | ICD-10-CM

## 2019-11-06 DIAGNOSIS — R109 Unspecified abdominal pain: Secondary | ICD-10-CM

## 2019-11-06 DIAGNOSIS — R45850 Homicidal ideations: Secondary | ICD-10-CM | POA: Diagnosis not present

## 2019-11-06 DIAGNOSIS — F209 Schizophrenia, unspecified: Secondary | ICD-10-CM | POA: Diagnosis present

## 2019-11-06 DIAGNOSIS — Z79899 Other long term (current) drug therapy: Secondary | ICD-10-CM | POA: Diagnosis not present

## 2019-11-06 DIAGNOSIS — Z20822 Contact with and (suspected) exposure to covid-19: Secondary | ICD-10-CM | POA: Diagnosis not present

## 2019-11-06 LAB — ETHANOL: Alcohol, Ethyl (B): <10 mg/dL (ref <10)

## 2019-11-06 LAB — COMPREHENSIVE METABOLIC PANEL
ALT: 11 U/L (ref 0–44)
AST: 18 U/L (ref 15–41)
Albumin: 4.3 g/dL (ref 3.5–5.0)
Alkaline Phosphatase: 38 U/L (ref 38–126)
Anion gap: 12 (ref 5–15)
BUN: 12 mg/dL (ref 6–20)
CO2: 23 mmol/L (ref 22–32)
Calcium: 9.8 mg/dL (ref 8.9–10.3)
Chloride: 101 mmol/L (ref 98–111)
Creatinine, Ser: 0.72 mg/dL (ref 0.44–1.00)
GFR calc Af Amer: >60 mL/min (ref >60)
GFR calc non Af Amer: >60 mL/min (ref >60)
Glucose, Bld: 97 mg/dL (ref 70–99)
Potassium: 4 mmol/L (ref 3.5–5.1)
Sodium: 136 mmol/L (ref 135–145)
Total Bilirubin: 0.6 mg/dL (ref 0.3–1.2)
Total Protein: 7.3 g/dL (ref 6.5–8.1)

## 2019-11-06 LAB — CBC WITH DIFFERENTIAL/PLATELET
Abs Immature Granulocytes: 0.01 10*3/uL (ref 0.00–0.07)
Basophils Absolute: 0 10*3/uL (ref 0.0–0.1)
Basophils Relative: 0 %
Eosinophils Absolute: 0 10*3/uL (ref 0.0–0.5)
Eosinophils Relative: 0 %
HCT: 37.3 % (ref 36.0–46.0)
Hemoglobin: 11.9 g/dL — ABNORMAL LOW (ref 12.0–15.0)
Immature Granulocytes: 0 %
Lymphocytes Relative: 23 %
Lymphs Abs: 1.3 10*3/uL (ref 0.7–4.0)
MCH: 28 pg (ref 26.0–34.0)
MCHC: 31.9 g/dL (ref 30.0–36.0)
MCV: 87.8 fL (ref 80.0–100.0)
Monocytes Absolute: 0.4 10*3/uL (ref 0.1–1.0)
Monocytes Relative: 7 %
Neutro Abs: 3.7 10*3/uL (ref 1.7–7.7)
Neutrophils Relative %: 70 %
Platelets: 305 10*3/uL (ref 150–400)
RBC: 4.25 MIL/uL (ref 3.87–5.11)
RDW: 14 % (ref 11.5–15.5)
WBC: 5.4 10*3/uL (ref 4.0–10.5)
nRBC: 0 % (ref 0.0–0.2)

## 2019-11-06 LAB — I-STAT BETA HCG BLOOD, ED (MC, WL, AP ONLY): I-stat hCG, quantitative: <5 m[IU]/mL (ref <5)

## 2019-11-06 LAB — RESPIRATORY PANEL BY RT PCR (FLU A&B, COVID)
Influenza A by PCR: NEGATIVE
Influenza B by PCR: NEGATIVE
SARS Coronavirus 2 by RT PCR: NEGATIVE

## 2019-11-06 LAB — LIPASE, BLOOD: Lipase: 27 U/L (ref 11–51)

## 2019-11-06 LAB — ACETAMINOPHEN LEVEL: Acetaminophen (Tylenol), Serum: <10 ug/mL — ABNORMAL LOW (ref 10–30)

## 2019-11-06 LAB — SALICYLATE LEVEL: Salicylate Lvl: <7 mg/dL — ABNORMAL LOW (ref 7.0–30.0)

## 2019-11-06 MED ORDER — ILOPERIDONE 4 MG PO TABS
2.0000 mg | ORAL_TABLET | Freq: Three times a day (TID) | ORAL | Status: DC
  Filled 2019-11-06: qty 1

## 2019-11-06 MED ORDER — TRAZODONE HCL 50 MG PO TABS: 150.0000 mg | ORAL_TABLET | Freq: Every evening | ORAL | Status: DC | PRN

## 2019-11-06 MED ORDER — ACETAMINOPHEN 325 MG PO TABS: 650.0000 mg | ORAL_TABLET | ORAL | Status: DC | PRN

## 2019-11-06 MED ORDER — ONDANSETRON HCL 4 MG PO TABS: 4.0000 mg | ORAL_TABLET | Freq: Three times a day (TID) | ORAL | Status: DC | PRN

## 2019-11-06 MED ORDER — BENZTROPINE MESYLATE 1 MG PO TABS
1.0000 mg | ORAL_TABLET | Freq: Two times a day (BID) | ORAL | Status: DC
  Administered 2019-11-07: 05:00:00 1 mg via ORAL
  Filled 2019-11-06: qty 1

## 2019-11-06 MED ORDER — ILOPERIDONE 2 MG PO TABS
2.0000 mg | ORAL_TABLET | Freq: Two times a day (BID) | ORAL | Status: DC
  Administered 2019-11-07: 05:00:00 2 mg via ORAL
  Filled 2019-11-06 (×3): qty 1

## 2019-11-06 MED ORDER — SERTRALINE HCL 50 MG PO TABS
50.0000 mg | ORAL_TABLET | Freq: Every day | ORAL | Status: DC
  Filled 2019-11-06: qty 1

## 2019-11-06 MED ORDER — RISPERIDONE 3 MG PO TABS
4.0000 mg | ORAL_TABLET | Freq: Two times a day (BID) | ORAL | Status: DC
  Administered 2019-11-07: 4 mg via ORAL
  Filled 2019-11-06 (×3): qty 2

## 2019-11-06 NOTE — ED Notes (Signed)
Dinner Tray Ordered @ [phone].

## 2019-11-06 NOTE — BHH Counselor (Signed)
Clock stopped, patient in hallway bed

## 2019-11-06 NOTE — ED Notes (Signed)
Attempted to call TTS no answer will call again later.

## 2019-11-06 NOTE — ED Notes (Signed)
Pt. Belongings inventoried and placed in locker #3 ; cell phone taken to security

## 2019-11-06 NOTE — ED Notes (Signed)
Patient asked to speak with provider. Provider notified and at bedside. Currently waiting for TTS.

## 2019-11-06 NOTE — Progress Notes (Addendum)
Patient ID: [name] [name], female   DOB: 10/21/1976, 43 y.o.   MRN: [id number]   Psychiatric assessment  HPI Per EDP notes:  [name] [name] is a 43 y.o. female with history of schizoaffective disorder and schizophrenia, homelessness presents from psychiatrist office for HI.  Patient denies this and states she is here because her stomach does not feel good.  She denies SI and HI, AVH.  She denies any chest pain, shortness of breath, vomiting.  Patient would not really answer whether she is having abdominal pain or just nausea, although she is asking for some water to drink. Patient denies drug or alcohol use.  Patient presenting from unknown psychiatrist office and HI.  Patient denies this.  She reports EMS was called because her stomach did not feel good.  EMS could not provide any more information other than that she denied HI to them as well and would not allow any further vitals besides a temperature.  Labs are unremarkable here. There is no abdominal tenderness.  She is eating and drinking.  She is feeling improved.  She denies SI and HI to me, however given reported HI, TTS evaluation pending.  Disposition pending their evaluation.  Patient is here voluntarily at this time, although does not want to go home.  If she does try to leave, I discussed I would need to IVC considering report told to EMS from psychiatry. Home medications ordered, patient placed in psych hold.  Psychiatric assessment by [facility] provider: Reola [name] is a 43 year old female who reports she in the ED because she was having stomach problems. Per chart review, it was documented that patient presented from unknown psychiatrist office with reports of  HI. Patient reports she is seeing a psychiatrists at [facility]. She denies that she told her psychiatrist that she was having homicidal thoughts. This has been denied to Clinical research associate, EDP, and EMS. She denies active or passive suicidal thoughts, auditory or visual hallucinations, or other  psychosis. During this evaluation, she does not make any delusional statements and she does not appear internally preoccupied. Patient was discharged from [facility] 10/21/2019 upon improvement in symptoms. She reports that she has been taking her medications as prescribed. She denies substance abuse or use. Her UDS is not resulted and ethanol is negative.   I called [employer].to see if I could speak with patients psychiatrist. I spoke with Vertis Kelch, who reported that patient sees a Nurse Practitioner who was not at available at that time. She reports that patient came into the office yesterday threatening to kill herself and others. Reports patient participates in PSR (psychosocial rehab) and reports while they were seeing her today, patient made threats again to kill herself and others. Reports the PSR team became concerned, called the police, and she was taken to the hospital. Reports patient has been non-complaint with all of her medications. Reports they have been trying to get patient to take a shot although she refuses. States," she is not trying to help herself so at this point, we do not know what to do." Reports patient does have housing now although they have got calls from her landlord that she is on the verge of getting put out because she make threats to harm others. Reports when patient is not taking her medication, they do feel as though she is a danger to herself.   Disposition: At this time, I am recommending overnight observation. I will recommned that patients home medications be restarted. It appears that the issue  is her non-compliance with treatment. Per chart review and collateral, patient always resists long-acting injectable medication. We will intiate this conversation again. Patient will be reassessed by psychiatry in the morning. Her COVID is negative.

## 2019-11-06 NOTE — ED Triage Notes (Signed)
Pt brought in by [facility] from her psychiatrist office for HI. EMS unable to provide more information about pt, states she has a temp of 99.29F but pt was not let them re-check her vitals. Pt mumbling to herself, tearful in triage. Pt states "I don't feel good" but will not provide any further details. Pt denies SI or HI but seems to be responding to internal stimuli.

## 2019-11-06 NOTE — ED Provider Notes (Addendum)
[facility] Provider Note   CSN: [id number] Arrival date & time: 11/06/19  1123     History Chief Complaint  Patient presents with  . Psychiatric Evaluation    [name] [name] is a 43 y.o. female with history of schizoaffective disorder and schizophrenia, homelessness presents from psychiatrist office for HI.  Patient denies this and states she is here because her stomach does not feel good.  She denies SI and HI, AVH.  She denies any chest pain, shortness of breath, vomiting.  Patient would not really answer whether she is having abdominal pain or just nausea, although she is asking for some water to drink.  Patient denies drug or alcohol use.  HPI     Past Medical History:  Diagnosis Date  . Arthritis   . Depression     Patient Active Problem List   Diagnosis Date Noted  . Schizoaffective disorder ([place]) 10/16/2019  . Schizophrenia ([place]) 09/19/2019  . Depression 09/01/2019  . Suicidal ideation   . Homelessness 07/06/2019  . Adjustment disorder with disturbance of emotion 07/06/2019  . Paranoid schizophrenia ([place]) 07/01/2019  . Hallucinations   . Schizoaffective disorder, bipolar type ([place]) 09/01/2018    No past surgical history on file.   OB History   No obstetric history on file.     No family history on file.  Social History   Tobacco Use  . Smoking status: Never Smoker  . Smokeless tobacco: Never Used  Substance Use Topics  . Alcohol use: Not Currently    Comment: BAC not available  . Drug use: Not Currently    Comment: UDS not available    Home Medications Prior to Admission medications   Medication Sig Start Date End Date Taking? Authorizing Provider  benzonatate (TESSALON) 100 MG capsule Take 1 capsule (100 mg total) by mouth 3 (three) times daily as needed for cough. Patient not taking: Reported on 10/10/2019 09/22/19   Rankin, Shuvon B, NP  benztropine (COGENTIN) 1 MG tablet Take 1 tablet (1 mg total) by  mouth 2 (two) times daily. 10/21/19   [name], MD  iloperidone (FANAPT) 4 MG TABS tablet Take 0.5 tablets (2 mg total) by mouth 3 (three) times daily. 10/21/19   [name], MD  risperiDONE (RISPERDAL) 4 MG tablet Take 1 tablet (4 mg total) by mouth 2 (two) times daily. 10/21/19   [name], MD  sertraline (ZOLOFT) 50 MG tablet Take 1 tablet (50 mg total) by mouth daily. 09/22/19   Rankin, Shuvon B, NP  traZODone (DESYREL) 150 MG tablet Take 1 tablet (150 mg total) by mouth at bedtime as needed and may repeat dose one time if needed for sleep. 10/21/19   [name], MD    Allergies    Patient has no known allergies.  Review of Systems   Review of Systems  Constitutional: Negative for chills and fever.  HENT: Negative for facial swelling and sore throat.   Respiratory: Negative for shortness of breath.   Cardiovascular: Negative for chest pain.  Gastrointestinal: Negative for abdominal pain (? "does not feel good"), nausea (? "stomach does not feel good") and vomiting.  Genitourinary: Negative for dysuria.  Musculoskeletal: Negative for back pain.  Skin: Negative for rash and wound.  Neurological: Negative for headaches.  Psychiatric/Behavioral: The patient is not nervous/anxious.     Physical Exam Updated Vital Signs BP 107/68 (BP Location: Left Arm)   Pulse 89   Temp 97.9 F (36.6 C) (Oral)   Resp  16   SpO2 100%   Physical Exam Vitals and nursing note reviewed.  Constitutional:      General: She is not in acute distress.    Appearance: She is well-developed. She is not diaphoretic.  HENT:     Head: Normocephalic and atraumatic.     Mouth/Throat:     Pharynx: No oropharyngeal exudate.  Eyes:     General: No scleral icterus.       Right eye: No discharge.        Left eye: No discharge.     Conjunctiva/sclera: Conjunctivae normal.     Pupils: Pupils are equal, round, and reactive to light.  Neck:     Thyroid: No thyromegaly.  Cardiovascular:     Rate and  Rhythm: Normal rate and regular rhythm.     Heart sounds: Normal heart sounds. No murmur. No friction rub. No gallop.   Pulmonary:     Effort: Pulmonary effort is normal. No respiratory distress.     Breath sounds: Normal breath sounds. No stridor. No wheezing or rales.  Abdominal:     General: Bowel sounds are normal. There is no distension.     Palpations: Abdomen is soft.     Tenderness: There is no abdominal tenderness. There is no guarding or rebound.  Musculoskeletal:     Cervical back: Normal range of motion and neck supple.  Lymphadenopathy:     Cervical: No cervical adenopathy.  Skin:    General: Skin is warm and dry.     Coloration: Skin is not pale.     Findings: No rash.  Neurological:     Mental Status: She is alert.     Coordination: Coordination normal.  Psychiatric:        Mood and Affect: Mood is depressed. Affect is blunt.        Behavior: Behavior is withdrawn.     ED Results / Procedures / Treatments   Labs (all labs ordered are listed, but only abnormal results are displayed) Labs Reviewed  CBC WITH DIFFERENTIAL/PLATELET - Abnormal; Notable for the following components:      Result Value   Hemoglobin 11.9 (*)    All other components within normal limits  ACETAMINOPHEN LEVEL - Abnormal; Notable for the following components:   Acetaminophen (Tylenol), Serum <10 (*)    All other components within normal limits  SALICYLATE LEVEL - Abnormal; Notable for the following components:   Salicylate Lvl <7.0 (*)    All other components within normal limits  RESPIRATORY PANEL BY RT PCR (FLU A&B, COVID)  LIPASE, BLOOD  COMPREHENSIVE METABOLIC PANEL  ETHANOL  RAPID URINE DRUG SCREEN, HOSP PERFORMED  I-STAT BETA HCG BLOOD, ED ([facility], [facility], [facility] ONLY)    EKG None  Radiology No results found.  Procedures Procedures (including critical care time)  Medications Ordered in ED Medications - No data to display  ED Course  I have reviewed the triage vital signs and  the nursing notes.  Pertinent labs & imaging results that were available during my care of the patient were reviewed by me and considered in my medical decision making (see chart for details).  Clinical Course as of Nov 06 1543  Thu Nov 06, 2019  1354 Patient medically clear. Labs stable and no abdominal tenderness indicating imaging at this time. Patient is drinking water. Disposition pending TTS evaluation.   [AL]    Clinical Course User Index [AL] Emi Holes, PA-C   MDM Rules/Calculators/A&P  Patient presenting from unknown psychiatrist office and HI.  Patient denies this.  She reports EMS was called because her stomach did not feel good.  EMS could not provide any more information other than that she denied HI to them as well and would not allow any further vitals besides a temperature.  Labs are unremarkable here.  There is no abdominal tenderness.  She is eating and drinking.  She is feeling improved.  She denies SI and HI to me, however given reported HI, TTS evaluation pending.  Disposition pending their evaluation.  Patient is here voluntarily at this time, although does not want to go home.  If she does try to leave, I discussed I would need to IVC considering report told to EMS from psychiatry. Home medications ordered, patient placed in psych hold.  Final Clinical Impression(s) / ED Diagnoses Final diagnoses:  Abdominal pain, unspecified abdominal location    Rx / DC Orders ED Discharge Orders    None       Emi HolesLaw, Cassidy Tabet M, PA-C 11/06/19 1545    Emi HolesLaw, Earlyne Feeser M, PA-C 11/06/19 1606    Gwyneth SproutPlunkett, [place], MD 11/07/19 1927

## 2019-11-06 NOTE — BH Assessment (Signed)
Tele Assessment Note   Patient Name: [name] [name] MRN: [id number] Referring Physician: Rica Mote, PA-C Location of Patient: [facility] Location of Provider: Behavioral Health TTS Department  [name] [name] is an 43 y.o. female. HPI Per EDP notes:  [name] a 43 y.o.femalewith history of schizoaffective disorder and schizophrenia, homelessness presents from psychiatrist office for HI. Patient denies this and states she is here because her stomach does not feel good. She denies SI and HI, AVH. She denies any chest pain, shortness of breath, vomiting. Patient would not really answer whether she is having abdominal pain or just nausea, although she is asking for some water to drink. Patient denies drug or alcohol use.  Patient presenting from unknown psychiatrist office and HI. Patient denies this. She reports EMS was called because her stomach did not feel good. EMS could not provide any more information other than that she denied HI to them as well and would not allow any further vitals besides a temperature. Labs are unremarkable here. There is no abdominal tenderness. She is eating and drinking. She is feeling improved. She denies SI and HI to me, however given reported HI, TTS evaluation pending. Disposition pending their evaluation.Patient is here voluntarily at this time, although does not want to go home. If she does try to leave, I discussed I would need to IVC considering reporttold to EMS from psychiatry.Home medications ordered, patient placed in psych hold.  Psychiatric assessment by [facility] provider, Denzil Magnuson 11/06/19 [name] [name] is a 43 year old female who reports she in the ED because she was having stomach problems. Per chart review, it was documented that patient presented from unknown psychiatrist office with reports of  HI. Patient reports she is seeing a psychiatrists at [facility]. She denies that she told her psychiatrist that she was having  homicidal thoughts. This has been denied to Clinical research associate, EDP, and EMS. She denies active or passive suicidal thoughts, auditory or visual hallucinations, or other psychosis. During this evaluation, she does not make any delusional statements and she does not appear internally preoccupied. Patient was discharged from [facility] 10/21/2019 upon improvement in symptoms. She reports that she has been taking her medications as prescribed. She denies substance abuse or use. Her UDS is not resulted and ethanol is negative.   I called [name] [name].to see if I could speak with patients psychiatrist. I spoke with [name] [name], who reported that patient sees a Nurse Practitioner who was not at available at that time. She reports that patient came into the office yesterday threatening to kill herself and others. Reports patient participates in PSR (psychosocial rehab) and reports while they were seeing her today, patient made threats again to kill herself and others. Reports the PSR team became concerned, called the police, and she was taken to the hospital. Reports patient has been non-complaint with all of her medications. Reports they have been trying to get patient to take a shot although she refuses. States," she is not trying to help herself so at this point, we do not know what to do." Reports patient does have housing now although they have got calls from her landlord that she is on the verge of getting put out because she make threats to harm others. Reports when patient is not taking her medication, they do feel as though she is a danger to herself.    TTS: Pt presented calm but quite during assessment. Pt speech was logical but incoherent at certain points. Pt was asked what brought her in  to the [facility]. Pt states, " My stomach was upset". Pt states that she has feeling sick since this morning. Pt denied SI, HI, AVH, self injurious behaviors and any substance use. Pt UDS currently pending. Pt states she is really hungry and  has not ate anything today. Pt reports lack of sleep last few days as well. Pt reports that she takes sleep medicine but has not took her medication last few days along with her other psychiatric medications. Pt currently has provider with [name] [name] and last seen her therapist yesterday she reports. Pt states that she also wants to move to a new home. Pt denies any other symptoms and issues and states again that her stomach is hurting. Pt was oriented x3 , logical speech, depressed and sullen mood and affect, partial judgment an good motor activity.  Disposition:  Per [name] Maes, FNP " At this time, I am recommending overnight observation. I will recommned that patients home medications be restarted. It appears that the issue is her non-compliance with treatment. Per chart review and collateral, patient always resists long-acting injectable medication. We will intiate this conversation again. Patient will be reassessed by psychiatry in the morning. Her COVID is negative.  TTS confirmed with attending provider.  Diagnosis: Schizophrenia  Past Medical History:  Past Medical History:  Diagnosis Date  . Arthritis   . Depression     No past surgical history on file.  Family History: No family history on file.  Social History:  reports that she has never smoked. She has never used smokeless tobacco. She reports previous alcohol use. She reports previous drug use.  Additional Social History:  Alcohol / Drug Use Pain Medications: SEE MAR Prescriptions: SEE MAR Over the Counter: SEE MAR  CIWA: CIWA-Ar BP: 107/68 Pulse Rate: 89 COWS:    Allergies: No Known Allergies  Home Medications: (Not in a hospital admission)   OB/GYN Status:  No LMP recorded.  General Assessment Data Assessment unable to be completed: Yes Reason for not completing assessment: patient in hallway  Location of Assessment: [facility] ED TTS Assessment: In system Is this a Tele or Face-to-Face Assessment?: Tele  Assessment Is this an Initial Assessment or a Re-assessment for this encounter?: Initial Assessment Patient Accompanied by:: N/A Language Other than English: No Living Arrangements: Other (Comment) What gender do you identify as?: Female Marital status: Single Admission Status: Voluntary           Risk to self with the past 6 months Suicidal Ideation: No Has patient been a risk to self within the past 6 months prior to admission? : Yes Suicidal Intent: No Has patient had any suicidal intent within the past 6 months prior to admission? : Yes Is patient at risk for suicide?: No Suicidal Plan?: No Has patient had any suicidal plan within the past 6 months prior to admission? : Yes Access to Means: No Specify Access to Suicidal Means: (na) What has been your use of drugs/alcohol within the last 12 months?: (UNKNOWN) Previous Attempts/Gestures: Yes How many times?: 3 Triggers for Past Attempts: Unknown Intentional Self Injurious Behavior: Cutting Family Suicide History: No Recent stressful life event(s): Divorce, Other (Comment) Persecutory voices/beliefs?: No Depression: Yes Depression Symptoms: Feeling angry/irritable, Insomnia, Feeling worthless/self pity Substance abuse history and/or treatment for substance abuse?: No Suicide prevention information given to non-admitted patients: Not applicable  Risk to Others within the past 6 months Homicidal Ideation: No Does patient have any lifetime risk of violence toward others beyond the six months prior  to admission? : No Thoughts of Harm to Others: No Current Homicidal Intent: No Current Homicidal Plan: No Access to Homicidal Means: No Identified Victim: NA History of harm to others?: No Assessment of Violence: None Noted Does patient have access to weapons?: No Criminal Charges Pending?: No Does patient have a court date: No Is patient on probation?: No  Psychosis Hallucinations: None noted Delusions: None  noted  Mental Status Report Appearance/Hygiene: In scrubs Eye Contact: Fair Motor Activity: Freedom of movement Speech: Logical/coherent, Soft Level of Consciousness: Quiet/awake Mood: Depressed Affect: Depressed Anxiety Level: None Thought Processes: Coherent Judgement: Partial Orientation: Person, Place, Time, Situation Obsessive Compulsive Thoughts/Behaviors: None  Cognitive Functioning Concentration: Normal Memory: Recent Intact Is patient IDD: No Insight: Fair Impulse Control: Fair Appetite: Poor Have you had any weight changes? : No Change Sleep: No Change Total Hours of Sleep: (varies) Vegetative Symptoms: None  ADLScreening [facility] Assessment Services) Patient's cognitive ability adequate to safely complete daily activities?: Yes Patient able to express need for assistance with ADLs?: Yes Independently performs ADLs?: Yes (appropriate for developmental age)  Prior Inpatient Therapy Prior Inpatient Therapy: Yes Prior Therapy Dates: Per chart, "2020, 2019." Prior Therapy Facilty/Provider(s): Per chart, "[facility]." Reason for Treatment: MH issues  Prior Outpatient Therapy Prior Outpatient Therapy: Yes Prior Therapy Dates: Current.  [name] Reason for Treatment: Medication management, CST, counselinfg.  Does patient have an ACCT team?: No Does patient have Intensive In-House Services?  : No Does patient have [facility] services? : No Does patient have P4CC services?: No  ADL Screening (condition at time of admission) Patient's cognitive ability adequate to safely complete daily activities?: Yes Patient able to express need for assistance with ADLs?: Yes Independently performs ADLs?: Yes (appropriate for developmental age)         Disposition Initial Assessment Completed for this Encounter: Yes  This service was provided via telemedicine using a 2-way, interactive audio and video technology.  Names of  all persons participating in this telemedicine service and their role in this encounter. Name: Marcelyn DittyBillie Boniface Role: Patient  Name: Lacey JensenKiara Lynley Killilea Role: TTS  Name:  Role:   Name:  Role:     Natasha MeadKiara M Lucky Alverson 11/06/2019 5:19 PM

## 2019-11-06 NOTE — ED Notes (Signed)
Patient moved to purple zone to have TTS per charge nurse and will return back to assigned spot after TTS.

## 2019-11-07 ENCOUNTER — Other Ambulatory Visit: Payer: Self-pay

## 2019-11-07 DIAGNOSIS — R109 Unspecified abdominal pain: Secondary | ICD-10-CM | POA: Diagnosis not present

## 2019-11-07 DIAGNOSIS — R45850 Homicidal ideations: Secondary | ICD-10-CM | POA: Diagnosis not present

## 2019-11-07 DIAGNOSIS — F209 Schizophrenia, unspecified: Secondary | ICD-10-CM | POA: Diagnosis present

## 2019-11-07 DIAGNOSIS — Z20822 Contact with and (suspected) exposure to covid-19: Secondary | ICD-10-CM | POA: Diagnosis not present

## 2019-11-07 DIAGNOSIS — F250 Schizoaffective disorder, bipolar type: Secondary | ICD-10-CM | POA: Diagnosis not present

## 2019-11-07 DIAGNOSIS — Z79899 Other long term (current) drug therapy: Secondary | ICD-10-CM | POA: Diagnosis not present

## 2019-11-07 DIAGNOSIS — R45851 Suicidal ideations: Secondary | ICD-10-CM | POA: Diagnosis not present

## 2019-11-07 DIAGNOSIS — Z590 Homelessness: Secondary | ICD-10-CM | POA: Diagnosis not present

## 2019-11-07 DIAGNOSIS — Z03818 Encounter for observation for suspected exposure to other biological agents ruled out: Secondary | ICD-10-CM | POA: Diagnosis not present

## 2019-11-07 LAB — RAPID URINE DRUG SCREEN, HOSP PERFORMED
Amphetamines: NOT DETECTED
Barbiturates: NOT DETECTED
Benzodiazepines: NOT DETECTED
Cocaine: NOT DETECTED
Opiates: NOT DETECTED
Tetrahydrocannabinol: NOT DETECTED

## 2019-11-07 NOTE — Progress Notes (Signed)
-  Patient received snack bag

## 2019-11-07 NOTE — ED Notes (Signed)
Family at bedside. 

## 2019-11-07 NOTE — ED Notes (Signed)
Patient belongings from locker and security returned to pt.

## 2019-11-07 NOTE — Discharge Instructions (Signed)
Please follow-up with your outpatient psychiatric team.  If any symptoms change or worsen, please return to the nearest emergency department.  Please rest and stay hydrated and follow-up with your primary doctor as well.

## 2019-11-07 NOTE — Consult Note (Signed)
Telepsych Consultation   Reason for Consult:  Reports of suicidal ideations Referring Physician:  EDP Location of Patient: [facility] ED Location of Provider: Behavioral Health TTS Department  Patient Identification: [name] [name] MRN:  [id number] Principal Diagnosis: Schizoaffective disorder, bipolar type (HCC) Diagnosis:  Principal Problem:   Schizoaffective disorder, bipolar type (HCC) Active Problems:   Homicidal ideations   Total Time spent with patient: 30 minutes  Tele Assessment  [name] [name], 44 y.o., female patient presented to [facility] ED at the request of her outpatient psychiatrist for suicidal and homicidal ideations in the setting of medication non-compliance. Of note, the patient denies this and relates she is here for abdominal pain.  Of note, when assessed at the EDP, she did not report abdominal pain.  Patient seen via telepsych by this provider; chart reviewed and consulted with Dr. Lucianne Muss on 11/07/19.  On evaluation [name] [name] reports "doing okay". She restarted her medications, relates she slept well and ate breakfast today.  She denies GI concerns after restarting medications. She was admitted to [facility] on 12/11 for schizoaffective disorder and medication adjustments. Patient was discharged with medication regimen and recommended to community mental health resources.  Per records, she has been non-compliant with her medications.  Motivational interviewing used to ascertain patient goals and fitness for medication adherence.  She relates her medications helps her when taken but states she ran of medicationss which prevented her from taking them. She states she will take her meds as prescribed when discharged and follow up with [facility] outpatient as recommended.    During evaluation [name] [name] is seated in upright position on the bed. She is alert/oriented x 4; calm/cooperative and demonstrates willingness to participate in the interview. and mood congruent with affect.  Patient  is speaking in a clear tone at moderate volume, and normal pace; with good eye contact.  Her thought process is coherent and relevant; There is no indication that she is currently responding to internal/external stimuli or experiencing delusional thought content.  Patient denies suicidal/self-harm/homicidal ideation, psychosis, and paranoia.  Patient has remained calm throughout assessment and has answered questions appropriately.   Past Psychiatric History: Schizoaffective disorder  Risk to Self: Suicidal Ideation: No Suicidal Intent: No Is patient at risk for suicide?: No Suicidal Plan?: No Access to Means: No Specify Access to Suicidal Means: (na) What has been your use of drugs/alcohol within the last 12 months?: (UNKNOWN) How many times?: 3 Triggers for Past Attempts: Unknown Intentional Self Injurious Behavior: Cutting Risk to Others: Homicidal Ideation: No Thoughts of Harm to Others: No Current Homicidal Intent: No Current Homicidal Plan: No Access to Homicidal Means: No Identified Victim: NA History of harm to others?: No Assessment of Violence: None Noted Does patient have access to weapons?: No Criminal Charges Pending?: No Does patient have a court date: No Prior Inpatient Therapy: Prior Inpatient Therapy: Yes Prior Therapy Dates: Per chart, "2020, 2019." Prior Therapy Facilty/Provider(s): Per chart, "[facility]." Reason for Treatment: MH issues Prior Outpatient Therapy: Prior Outpatient Therapy: Yes Prior Therapy Dates: Current.  Prior Therapy Facilty/Provider(s): [facility] Reason for Treatment: Medication management, CST, counselinfg.  Does patient have an ACCT team?: No Does patient have Intensive In-House Services?  : No Does patient have [facility] services? : No Does patient have P4CC services?: No  Past Medical History:  Past Medical History:  Diagnosis Date  . Arthritis   . Depression    No past surgical history on file. Family History: No  family history on  file. Family Psychiatric  History: unknown Social History:  Social History   Substance and Sexual Activity  Alcohol Use Not Currently   Comment: BAC not available     Social History   Substance and Sexual Activity  Drug Use Not Currently   Comment: UDS not available    Social History   Socioeconomic History  . Marital status: Single    Spouse name: Not on file  . Number of children: Not on file  . Years of education: Not on file  . Highest education level: Not on file  Occupational History  . Occupation: Unknown  Tobacco Use  . Smoking status: Never Smoker  . Smokeless tobacco: Never Used  Substance and Sexual Activity  . Alcohol use: Not Currently    Comment: BAC not available  . Drug use: Not Currently    Comment: UDS not available  . Sexual activity: Not Currently  Other Topics Concern  . Not on file  Social History Narrative   Pt stated that she lives in [place], and that she lives alone.     Social Determinants of Health   Financial Resource Strain:   . Difficulty of Paying Living Expenses: Not on file  Food Insecurity:   . Worried About Programme researcher, broadcasting/film/video in the Last Year: Not on file  . Ran Out of Food in the Last Year: Not on file  Transportation Needs:   . Lack of Transportation (Medical): Not on file  . Lack of Transportation (Non-Medical): Not on file  Physical Activity:   . Days of Exercise per Week: Not on file  . Minutes of Exercise per Session: Not on file  Stress:   . Feeling of Stress : Not on file  Social Connections:   . Frequency of Communication with Friends and Family: Not on file  . Frequency of Social Gatherings with Friends and Family: Not on file  . Attends Religious Services: Not on file  . Active Member of Clubs or Organizations: Not on file  . Attends Banker Meetings: Not on file  . Marital Status: Not on file   Additional Social History:    Allergies:  No Known Allergies  Labs:   Results for orders placed or performed during the hospital encounter of 11/06/19 (from the past 48 hour(s))  Lipase, blood     Status: None   Collection Time: 11/06/19 12:23 PM  Result Value Ref Range   Lipase 27 11 - 51 U/L    Comment: Performed at [facility], [address] [place], [place] [address]  Comprehensive metabolic panel     Status: None   Collection Time: 11/06/19 12:23 PM  Result Value Ref Range   Sodium 136 135 - 145 mmol/L   Potassium 4.0 3.5 - 5.1 mmol/L   Chloride 101 98 - 111 mmol/L   CO2 23 22 - 32 mmol/L   Glucose, Bld 97 70 - 99 mg/dL   BUN 12 6 - 20 mg/dL   Creatinine, Ser 4.43 0.44 - 1.00 mg/dL   Calcium 9.8 8.9 - 15.4 mg/dL   Total Protein 7.3 6.5 - 8.1 g/dL   Albumin 4.3 3.5 - 5.0 g/dL   AST 18 15 - 41 U/L   ALT 11 0 - 44 U/L   Alkaline Phosphatase 38 38 - 126 U/L   Total Bilirubin 0.6 0.3 - 1.2 mg/dL   GFR calc non Af Amer >60 >60 mL/min   GFR calc Af Amer >60 >60 mL/min  Anion gap 12 5 - 15    Comment: Performed at [facility], [address] [place], [place] [address]  CBC with Diff     Status: Abnormal   Collection Time: 11/06/19 12:23 PM  Result Value Ref Range   WBC 5.4 4.0 - 10.5 K/uL   RBC 4.25 3.87 - 5.11 MIL/uL   Hemoglobin 11.9 (L) 12.0 - 15.0 g/dL   HCT 60.437.3 54.036.0 - 98.146.0 %   MCV 87.8 80.0 - 100.0 fL   MCH 28.0 26.0 - 34.0 pg   MCHC 31.9 30.0 - 36.0 g/dL   RDW 19.114.0 47.811.5 - 29.515.5 %   Platelets 305 150 - 400 K/uL   nRBC 0.0 0.0 - 0.2 %   Neutrophils Relative % 70 %   Neutro Abs 3.7 1.7 - 7.7 K/uL   Lymphocytes Relative 23 %   Lymphs Abs 1.3 0.7 - 4.0 K/uL   Monocytes Relative 7 %   Monocytes Absolute 0.4 0.1 - 1.0 K/uL   Eosinophils Relative 0 %   Eosinophils Absolute 0.0 0.0 - 0.5 K/uL   Basophils Relative 0 %   Basophils Absolute 0.0 0.0 - 0.1 K/uL   Immature Granulocytes 0 %   Abs Immature Granulocytes 0.01 0.00 - 0.07 K/uL    Comment: Performed at [facility], [address] [place], [place] [address]   Ethanol     Status: None   Collection Time: 11/06/19 12:25 PM  Result Value Ref Range   Alcohol, Ethyl (B) <10 <10 mg/dL    Comment: (NOTE) Lowest detectable limit for serum alcohol is 10 mg/dL. For medical purposes only. Performed at [facility], [address] [place], [place] [address]   Acetaminophen level     Status: Abnormal   Collection Time: 11/06/19 12:25 PM  Result Value Ref Range   Acetaminophen (Tylenol), Serum <10 (L) 10 - 30 ug/mL    Comment: (NOTE) Therapeutic concentrations vary significantly. A range of 10-30 ug/mL  may be an effective concentration for many patients. However, some  are best treated at concentrations outside of this range. Acetaminophen concentrations >150 ug/mL at 4 hours after ingestion  and >50 ug/mL at 12 hours after ingestion are often associated with  toxic reactions. Performed at [facility], [address] [place], [place] [address]   Salicylate level     Status: Abnormal   Collection Time: 11/06/19 12:25 PM  Result Value Ref Range   Salicylate Lvl <7.0 (L) 7.0 - 30.0 mg/dL    Comment: Performed at [facility], [address] [place], [place] [address]  I-Stat beta hCG blood, ED     Status: None   Collection Time: 11/06/19 12:30 PM  Result Value Ref Range   I-stat hCG, quantitative <5.0 <5 mIU/mL   Comment 3            Comment:   GEST. AGE      CONC.  (mIU/mL)   <=1 WEEK        5 - 50     2 WEEKS       50 - 500     3 WEEKS       100 - 10,000     4 WEEKS     1,000 - 30,000        FEMALE AND NON-PREGNANT FEMALE:     LESS THAN 5 mIU/mL   Respiratory Panel by RT PCR (Flu A&B, Covid) - Nasopharyngeal Swab  Status: None   Collection Time: 11/06/19 12:56 PM   Specimen: Nasopharyngeal Swab  Result Value Ref Range   SARS Coronavirus 2 by RT PCR NEGATIVE NEGATIVE    Comment: (NOTE) SARS-CoV-2 target nucleic acids are NOT DETECTED. The SARS-CoV-2 RNA is generally detectable in upper respiratoy specimens  during the acute phase of infection. The lowest concentration of SARS-CoV-2 viral copies this assay can detect is 131 copies/mL. A negative result does not preclude SARS-Cov-2 infection and should not be used as the sole basis for treatment or other patient management decisions. A negative result may occur with  improper specimen collection/handling, submission of specimen other than nasopharyngeal swab, presence of viral mutation(s) within the areas targeted by this assay, and inadequate number of viral copies (<131 copies/mL). A negative result must be combined with clinical observations, patient history, and epidemiological information. The expected result is Negative. Fact Sheet for Patients:  https://www.moore.com/https://www.fda.gov/media/142436/download Fact Sheet for Healthcare Providers:  https://www.young.biz/https://www.fda.gov/media/142435/download This test is not yet ap proved or cleared by the [place] FDA and  has been authorized for detection and/or diagnosis of SARS-CoV-2 by FDA under an Emergency Use Authorization (EUA). This EUA will remain  in effect (meaning this test can be used) for the duration of the COVID-19 declaration under Section 564(b)(1) of the Act, 21 U.S.C. section 360bbb-3(b)(1), unless the authorization is terminated or revoked sooner.    Influenza A by PCR NEGATIVE NEGATIVE   Influenza B by PCR NEGATIVE NEGATIVE    Comment: (NOTE) The Xpert Xpress SARS-CoV-2/FLU/RSV assay is intended as an aid in  the diagnosis of influenza from Nasopharyngeal swab specimens and  should not be used as a sole basis for treatment. Nasal washings and  aspirates are unacceptable for Xpert Xpress SARS-CoV-2/FLU/RSV  testing. Fact Sheet for Patients: https://www.moore.com/https://www.fda.gov/media/142436/download Fact Sheet for Healthcare Providers: https://www.young.biz/https://www.fda.gov/media/142435/download This test is not yet approved or cleared by the [place] FDA and  has been authorized for detection and/or diagnosis of SARS-CoV-2  by  FDA under an Emergency Use Authorization (EUA). This EUA will remain  in effect (meaning this test can be used) for the duration of the  Covid-19 declaration under Section 564(b)(1) of the Act, 21  U.S.C. section 360bbb-3(b)(1), unless the authorization is  terminated or revoked. Performed at [facility], [address] [place], [place] [address]   Urine rapid drug screen (hosp performed)     Status: None   Collection Time: 11/07/19  1:29 AM  Result Value Ref Range   Opiates NONE DETECTED NONE DETECTED   Cocaine NONE DETECTED NONE DETECTED   Benzodiazepines NONE DETECTED NONE DETECTED   Amphetamines NONE DETECTED NONE DETECTED   Tetrahydrocannabinol NONE DETECTED NONE DETECTED   Barbiturates NONE DETECTED NONE DETECTED    Comment: (NOTE) DRUG SCREEN FOR MEDICAL PURPOSES ONLY.  IF CONFIRMATION IS NEEDED FOR ANY PURPOSE, NOTIFY LAB WITHIN 5 DAYS. LOWEST DETECTABLE LIMITS FOR URINE DRUG SCREEN Drug Class                     Cutoff (ng/mL) Amphetamine and metabolites    1000 Barbiturate and metabolites    200 Benzodiazepine                 200 Tricyclics and metabolites     300 Opiates and metabolites        300 Cocaine and metabolites        300 THC  50 Performed at [facility], [place] [address], [place], [place] [address]     Medications:  Current Facility-Administered Medications  Medication Dose Route Frequency Provider Last Rate Last Admin  . acetaminophen (TYLENOL) tablet 650 mg  650 mg Oral Q4H PRN Law, Alexandra M, PA-C      . benztropine (COGENTIN) tablet 1 mg  1 mg Oral BID Eliezer Mccoy M, PA-C   1 mg at 11/07/19 0435  . Iloperidone TABS 2 mg  2 mg Oral BID Eliezer Mccoy M, PA-C   2 mg at 11/07/19 0436  . ondansetron (ZOFRAN) tablet 4 mg  4 mg Oral Q8H PRN Law, Alexandra M, PA-C      . risperiDONE (RISPERDAL) tablet 4 mg  4 mg Oral BID Eliezer Mccoy M, PA-C   4 mg at 11/07/19 0435  . sertraline (ZOLOFT) tablet 50 mg   50 mg Oral Daily Law, Alexandra M, PA-C      . traZODone (DESYREL) tablet 150 mg  150 mg Oral QHS PRN,MR X 1 Law, Alexandra M, PA-C       Current Outpatient Medications  Medication Sig Dispense Refill  . benztropine (COGENTIN) 1 MG tablet Take 1 tablet (1 mg total) by mouth 2 (two) times daily. 60 tablet 3  . iloperidone (FANAPT) 4 MG TABS tablet Take 0.5 tablets (2 mg total) by mouth 3 (three) times daily. (Patient taking differently: Take 2 mg by mouth 2 (two) times daily. ) 45 tablet 2  . risperiDONE (RISPERDAL) 4 MG tablet Take 1 tablet (4 mg total) by mouth 2 (two) times daily. 60 tablet 2  . sertraline (ZOLOFT) 50 MG tablet Take 1 tablet (50 mg total) by mouth daily. 30 tablet 0  . traZODone (DESYREL) 150 MG tablet Take 1 tablet (150 mg total) by mouth at bedtime as needed and may repeat dose one time if needed for sleep. 90 tablet 1  . azithromycin (ZITHROMAX) 250 MG tablet Take 250-500 mg by mouth See admin instructions. 500mg  on day one and 250mg  on days 2 through 5.    . benzonatate (TESSALON) 100 MG capsule Take 1 capsule (100 mg total) by mouth 3 (three) times daily as needed for cough. (Patient not taking: Reported on 10/10/2019) 20 capsule 0    Musculoskeletal: Strength & Muscle To   Psychiatric Specialty Exam: Physical Exam  Constitutional: She is oriented to person, place, and time. She appears well-developed.  HENT:  Head: Normocephalic.  Eyes: Pupils are equal, round, and reactive to light.  Cardiovascular: Normal rate.  Respiratory: Effort normal.  Musculoskeletal:        General: Normal range of motion.     Cervical back: Normal range of motion.  Neurological: She is alert and oriented to person, place, and time.  Psychiatric: She has a normal mood and affect. Her behavior is normal. Judgment and thought content normal.    Review of Systems  Gastrointestinal: Negative for abdominal pain, constipation, diarrhea, nausea and vomiting.       Denies GI concerns after  restarting medications.    Blood pressure 107/74, pulse (!) 105, temperature 98.3 F (36.8 C), temperature source Oral, resp. rate 17, SpO2 100 %.There is no height or weight on file to calculate BMI.  General Appearance: Casual and Fairly Groomed  Eye Contact:  Good  Speech:  Clear and Coherent  Volume:  Normal  Mood:  Euthymic and patient is calm   Affect:  Congruent and Constricted  Thought Process:  Coherent and Descriptions  of Associations: Intact  Orientation:  Full (Time, Place, and Person)  Thought Content:  Logical  Suicidal Thoughts:  No  Homicidal Thoughts:  No  Memory:  Immediate;   Good Recent;   Fair Remote;   Fair  Judgement:  Intact  Insight:  Good  Psychomotor Activity:  Normal  Concentration:  Concentration: Good and Attention Span: Good  Recall:  [employer] of Knowledge:  Fair  Language:  Good  Akathisia:  Negative  Handed:  Right  AIMS (if indicated):     Assets:  Communication Skills Desire for Improvement Housing Social Support  ADL's:  Intact  Cognition:  WNL  Sleep:   <6 hours     Treatment Plan Summary:  [name] [name] was admitted for Schizoaffective disorder, bipolar type (HCC) and crisis management.  She was restarted on the following medications  lloperidone 2mg  tabs BID; risperidone 4mg  tabs BID; sertraline 50mg  tab daily.  Improvement was monitored by observation and daily report of symptom reduction.  Emotional and mental status was monitored by daily self-inventory reports completed by and clinical staff.    The patient appears reasonably screened and/or stabilized for discharge and does not appear to have emergency psychiatric concerns/conditions requiring further screening, evaluation, or treatment at this time prior to discharge.   Current medication reviewed and the patient was instructed on how to take medications as prescribed; (details listed below under Medication List).  Will call 2 week supply of medications  to [facility] per patient request.     will follow up with the services as listed below under Follow Up Information.     Upon completion of this admission the [name] [name] was both mentally and medically stable for discharge denying suicidal/homicidal ideation, auditory/visual/tactile hallucinations, delusional thoughts and paranoia.     SW to coordinate discharge with outpatient MH team.    Disposition: No evidence of imminent risk to self or others at present.   Patient does not meet criteria for psychiatric inpatient admission. Discussed crisis plan, support from social network, calling 911, coming to the Emergency Department, and calling Suicide Hotline.  This service was provided via telemedicine using a 2-way, interactive audio and video technology.  Names of all persons participating in this telemedicine service and their role in this encounter. Name: [name] [name] Role: patient  Name: [name] [name] Role: PMHNP    [name] Ditty, NP 11/07/2019 1:06 PM

## 2019-11-07 NOTE — ED Provider Notes (Signed)
2:03 PM Psychiatry team called report that patient has been psychiatrically cleared for discharge.  She is no longer having SI or HI and she is up her mental status baseline in regards to hallucinations.  I went and reassessed the patient and she has no physical complaints and denied SI or HI to me.  As this fits with the psychiatry team's plan, we will get her paperwork ready for discharge.  She was instructed to follow-up with her outpatient team and return if any symptoms change or worsen.  Patient discharged in good condition.     Giannis Corpuz, Canary Brim, MD 11/07/19 [phone]

## 2019-11-10 ENCOUNTER — Other Ambulatory Visit: Payer: Self-pay

## 2019-11-10 ENCOUNTER — Inpatient Hospital Stay (HOSPITAL_COMMUNITY)
Admission: AD | Admit: 2019-11-10 | Discharge: 2019-11-13 | DRG: 885 | Disposition: A | Discharge status: Home / Self Care | Payer: Medicare Other | Attending: Psychiatry | Admitting: Psychiatry

## 2019-11-10 ENCOUNTER — Encounter (HOSPITAL_COMMUNITY): Payer: Self-pay | Admitting: Behavioral Health

## 2019-11-10 DIAGNOSIS — F203 Undifferentiated schizophrenia: Secondary | ICD-10-CM | POA: Diagnosis present

## 2019-11-10 DIAGNOSIS — R45851 Suicidal ideations: Secondary | ICD-10-CM | POA: Diagnosis not present

## 2019-11-10 DIAGNOSIS — G4700 Insomnia, unspecified: Secondary | ICD-10-CM | POA: Diagnosis present

## 2019-11-10 DIAGNOSIS — F209 Schizophrenia, unspecified: Secondary | ICD-10-CM | POA: Diagnosis present

## 2019-11-10 DIAGNOSIS — Z79899 Other long term (current) drug therapy: Secondary | ICD-10-CM | POA: Diagnosis not present

## 2019-11-10 DIAGNOSIS — Z9119 Patient's noncompliance with other medical treatment and regimen: Secondary | ICD-10-CM | POA: Diagnosis not present

## 2019-11-10 DIAGNOSIS — F250 Schizoaffective disorder, bipolar type: Secondary | ICD-10-CM | POA: Diagnosis not present

## 2019-11-10 DIAGNOSIS — M1990 Unspecified osteoarthritis, unspecified site: Secondary | ICD-10-CM | POA: Diagnosis present

## 2019-11-10 DIAGNOSIS — Z20822 Contact with and (suspected) exposure to covid-19: Secondary | ICD-10-CM | POA: Diagnosis present

## 2019-11-10 DIAGNOSIS — F308 Other manic episodes: Secondary | ICD-10-CM | POA: Diagnosis present

## 2019-11-10 LAB — RESPIRATORY PANEL BY RT PCR (FLU A&B, COVID)
Influenza A by PCR: NEGATIVE
Influenza B by PCR: NEGATIVE
SARS Coronavirus 2 by RT PCR: NEGATIVE

## 2019-11-10 MED ORDER — ONDANSETRON HCL 4 MG PO TABS
4.0000 mg | ORAL_TABLET | Freq: Three times a day (TID) | ORAL | Status: DC | PRN
  Filled 2019-11-10: qty 1

## 2019-11-10 MED ORDER — LORAZEPAM 2 MG/ML IJ SOLN
4.0000 mg | Freq: Once | INTRAMUSCULAR | Status: AC
  Administered 2019-11-10: 4 mg via INTRAMUSCULAR
  Filled 2019-11-10: qty 2

## 2019-11-10 MED ORDER — RISPERIDONE 2 MG PO TABS
4.0000 mg | ORAL_TABLET | Freq: Two times a day (BID) | ORAL | Status: DC
  Administered 2019-11-10 – 2019-11-11 (×2): 4 mg via ORAL
  Filled 2019-11-10 (×10): qty 2

## 2019-11-10 MED ORDER — SERTRALINE HCL 50 MG PO TABS
50.0000 mg | ORAL_TABLET | Freq: Every day | ORAL | Status: DC
  Administered 2019-11-10 – 2019-11-13 (×3): 50 mg via ORAL
  Filled 2019-11-10 (×7): qty 1

## 2019-11-10 MED ORDER — ACETAMINOPHEN 325 MG PO TABS
650.0000 mg | ORAL_TABLET | ORAL | Status: DC | PRN
  Administered 2019-11-12 – 2019-11-13 (×3): 650 mg via ORAL
  Filled 2019-11-10 (×3): qty 2

## 2019-11-10 MED ORDER — BENZTROPINE MESYLATE 1 MG PO TABS
1.0000 mg | ORAL_TABLET | Freq: Two times a day (BID) | ORAL | Status: DC
  Administered 2019-11-10 – 2019-11-11 (×2): 1 mg via ORAL
  Filled 2019-11-10 (×12): qty 1

## 2019-11-10 MED ORDER — HALOPERIDOL LACTATE 5 MG/ML IJ SOLN
10.0000 mg | Freq: Four times a day (QID) | INTRAMUSCULAR | Status: DC | PRN
  Administered 2019-11-10: 10 mg via INTRAMUSCULAR
  Filled 2019-11-10: qty 2

## 2019-11-10 MED ORDER — HALOPERIDOL 5 MG PO TABS
5.0000 mg | ORAL_TABLET | Freq: Four times a day (QID) | ORAL | Status: DC | PRN
  Filled 2019-11-10: qty 1

## 2019-11-10 MED ORDER — TRAZODONE HCL 150 MG PO TABS
150.0000 mg | ORAL_TABLET | Freq: Every evening | ORAL | Status: DC | PRN
  Administered 2019-11-11: 150 mg via ORAL
  Filled 2019-11-10 (×2): qty 1

## 2019-11-10 NOTE — H&P (Signed)
Behavioral Health Medical Screening Exam  [name] [name] is an 44 y.o. female. She is presenting under IVC from [facility]. Per IVC paperwork, patient has been refusing medications including LAI and believes that someone is trying to kill her. She has been threatening toward the staff at the agency. On assessment she is acutely psychotic. She is responding to internal stimuli so severely that she is unable to answer basic questions about orientation, SI/HI/AVH or provide meaningful history. She is crying and talking incoherently to herself with pressured speech.  Total Time spent with patient: 15 minutes  Psychiatric Specialty Exam: Physical Exam  Vitals reviewed. Constitutional: She appears well-developed and well-nourished.  HENT:  Head: Normocephalic and atraumatic.  Cardiovascular: Normal rate.  Respiratory: Effort normal.  Musculoskeletal:        General: Normal range of motion.     Cervical back: Normal range of motion.  Neurological: She is alert.    Review of Systems  Constitutional: Negative.   Psychiatric/Behavioral: Positive for agitation, behavioral problems, dysphoric mood and hallucinations. The patient is nervous/anxious and is hyperactive.     Blood pressure (P) 125/72, pulse (P) 93, temperature (P) 98 F (36.7 C), temperature source (P) Oral, resp. rate (P) 20, SpO2 (P) 99 %.There is no height or weight on file to calculate BMI.  General Appearance: Disheveled  Eye Contact:  Poor  Speech:  Pressured  Volume:  Increased  Mood:  Anxious  Affect:  Congruent and Tearful  Thought Process:  Disorganized and Irrelevant  Orientation:  Other:  UTA- patient not responding to questions  Thought Content:  Illogical, Delusions, Paranoid Ideation and Rumination  Suicidal Thoughts:  UTA- patient not responding to questions  Homicidal Thoughts:  UTA- patient not responding to questions  Memory:  UTA- patient not responding to questions  Judgement:  Impaired  Insight:   Lacking  Psychomotor Activity:  Increased  Concentration: Concentration: Poor and Attention Span: Poor  Recall:  Poor  Fund of Knowledge:Poor  Language: Poor  Akathisia:  No  Handed:  Right  AIMS (if indicated):     Assets:  Physical Health Resilience Social Support  Sleep:       Musculoskeletal: Strength & Muscle Tone: within normal limits Gait & Station: normal Patient leans: N/A  Blood pressure (P) 125/72, pulse (P) 93, temperature (P) 98 F (36.7 C), temperature source (P) Oral, resp. rate (P) 20, SpO2 (P) 99 %.  Recommendations:  Admit to observation unit. Based on my evaluation the patient does not appear to have an emergency medical condition.  Aldean Baker, NP 11/10/2019, 3:50 PM

## 2019-11-10 NOTE — BH Assessment (Signed)
Assessment Note  [name] [name] is an 44 y.o. female that presents this date with IVC. Per IVC patient is a client of [facility] respondent is non-compliant with current medication regimen. Respondent states that someone is trying to kill her and when she takes medication she always "gets worse." Respondent has a long history of inpatient hospitalizations and her psychotic symptoms continue to exacerbate without proper medication interventions. Respondent has threatened staff at the agency this date and stated "[name] [name] is trying to kill me." Patient is unable to be assessed this date being actively psychotic with altered mental state at the time of assessment. Patient is not oriented to time or place and is speaking incoherently. Patient is unable to be redirected. It is unclear if patient is currently responding to any internal stimuli. Information to complete assessment was obtained from admission notes and prior history. Per chart review [name] [name] notes, 'Patient is presenting under IVC from [facility]. Per IVC paperwork, patient has been refusing medications including LAI and believes that someone is trying to kill her. She has been threatening toward the staff at the agency. On assessment she is acutely psychotic. She is responding to internal stimuli so severely that she is unable to answer basic questions about orientation, SI/HI/AVH or provide meaningful history. She is crying and talking incoherently to herself with pressured speech". Patient was last seen on 11/05/20 when she presented with similar symptoms. Per that note patient has a history of schizoaffective disorder and schizophrenia. Patient is currently receiving services from [facility] and has not been compliant with her current medication regimen. Patient has no noted history of SA issues. Patientis dressed in layers of clothes with poor hygiene. Patient is not oriented and is noted to be speaking incoherently in a loud pressured  voice. Patient is unable to be redirected. Eye contact ispoor. Patient'smood is irritable and preoccupied;affect is congruent with mood. Thought process isdisorganized. It is unclear if patient is currently responding to internal stimuli. Case was staffed with [name] [name] who recommended patient be observed and monitored.       Diagnosis: F25.0 Schizoaffective disorder   Past Medical History:  Past Medical History:  Diagnosis Date  . Arthritis   . Depression     No past surgical history on file.  Family History: No family history on file.  Social History:  reports that she has never smoked. She has never used smokeless tobacco. She reports previous alcohol use. She reports previous drug use.  Additional Social History:  Alcohol / Drug Use Pain Medications: See MAR Prescriptions: See MAR Over the Counter: See MAR History of alcohol / drug use?: No history of alcohol / drug abuse  CIWA: CIWA-Ar BP: (P) 125/72 Pulse Rate: (P) 93 COWS:    Allergies: No Known Allergies  Home Medications:  Medications Prior to Admission  Medication Sig Dispense Refill  . azithromycin (ZITHROMAX) 250 MG tablet Take 250-500 mg by mouth See admin instructions. 500mg  on day one and 250mg  on days 2 through 5.    . benzonatate (TESSALON) 100 MG capsule Take 1 capsule (100 mg total) by mouth 3 (three) times daily as needed for cough. (Patient not taking: Reported on 10/10/2019) 20 capsule 0  . benztropine (COGENTIN) 1 MG tablet Take 1 tablet (1 mg total) by mouth 2 (two) times daily. 60 tablet 3  . iloperidone (FANAPT) 4 MG TABS tablet Take 0.5 tablets (2 mg total) by mouth 3 (three) times daily. (Patient taking differently: Take 2 mg by mouth  2 (two) times daily. ) 45 tablet 2  . risperiDONE (RISPERDAL) 4 MG tablet Take 1 tablet (4 mg total) by mouth 2 (two) times daily. 60 tablet 2  . sertraline (ZOLOFT) 50 MG tablet Take 1 tablet (50 mg total) by mouth daily. 30 tablet 0  . traZODone (DESYREL) 150 MG  tablet Take 1 tablet (150 mg total) by mouth at bedtime as needed and may repeat dose one time if needed for sleep. 90 tablet 1    OB/GYN Status:  No LMP recorded.  General Assessment Data Location of Assessment: [facility] TTS Assessment: In system Is this a Tele or Face-to-Face Assessment?: Face-to-Face Is this an Initial Assessment or a Re-assessment for this encounter?: Initial Assessment Patient Accompanied by:: N/A Language Other than English: No Living Arrangements: Other (Comment) What gender do you identify as?: Female Marital status: Single Pregnancy Status: No Living Arrangements: Alone Can pt return to current living arrangement?: Yes Admission Status: Involuntary Petitioner: Other Is patient capable of signing voluntary admission?: Yes Referral Source: Other Insurance type: Medicaid  Medical Screening Exam [facility] Walk-in ONLY) Medical Exam completed: Yes  Crisis Care Plan Living Arrangements: Alone Legal Guardian: (NA) Name of Psychiatrist: [facility] Name of Therapist: [facility]  Education Status Is patient currently in school?: No Is the patient employed, unemployed or receiving disability?: Receiving disability income  Risk to self with the past 6 months Suicidal Ideation: No Has patient been a risk to self within the past 6 months prior to admission? : Yes Suicidal Intent: No Has patient had any suicidal intent within the past 6 months prior to admission? : Yes Is patient at risk for suicide?: Yes Suicidal Plan?: No Has patient had any suicidal plan within the past 6 months prior to admission? : Yes Specify Current Suicidal Plan: (NA) Access to Means: No Specify Access to Suicidal Means: (NA) What has been your use of drugs/alcohol within the last 12 months?: Denies Previous Attempts/Gestures: Yes How many times?: 3 Other Self Harm Risks: (Off medications) Triggers for Past Attempts: Unknown Intentional Self Injurious Behavior:  Cutting Comment - Self Injurious Behavior: (Per hx) Family Suicide History: No Recent stressful life event(s): (UTA) Persecutory voices/beliefs?: No Depression: (UTA) Depression Symptoms: (UTA) Substance abuse history and/or treatment for substance abuse?: No Suicide prevention information given to non-admitted patients: Not applicable  Risk to Others within the past 6 months Homicidal Ideation: No Does patient have any lifetime risk of violence toward others beyond the six months prior to admission? : No Thoughts of Harm to Others: No Current Homicidal Intent: No Current Homicidal Plan: No Access to Homicidal Means: No Identified Victim: NA History of harm to others?: No Assessment of Violence: None Noted Violent Behavior Description: NA Does patient have access to weapons?: No Criminal Charges Pending?: No Does patient have a court date: No Is patient on probation?: No  Psychosis Hallucinations: Auditory, Visual Delusions: Persecutory  Mental Status Report Appearance/Hygiene: Unremarkable Eye Contact: Unable to Assess Motor Activity: Freedom of movement Speech: Unable to assess Level of Consciousness: Crying Mood: Anxious Affect: Angry Anxiety Level: Moderate Thought Processes: Unable to Assess Judgement: Unable to Assess Orientation: Unable to assess Obsessive Compulsive Thoughts/Behaviors: Unable to Assess  Cognitive Functioning Concentration: Unable to Assess Memory: Unable to Assess Is patient IDD: No Insight: Unable to Assess Impulse Control: Unable to Assess Appetite: (UTA) Have you had any weight changes? : (UTA) Sleep: (UTA) Total Hours of Sleep: (UTA) Vegetative Symptoms: None  ADLScreening [facility] Assessment Services) Patient's cognitive  ability adequate to safely complete daily activities?: Yes Patient able to express need for assistance with ADLs?: Yes Independently performs ADLs?: Yes (appropriate for developmental age)  Prior Inpatient  Therapy Prior Inpatient Therapy: Yes Prior Therapy Dates: 2020, 2019, 2018 Prior Therapy Facilty/Provider(s): [facility] Reason for Treatment: MH issues  Prior Outpatient Therapy Prior Outpatient Therapy: Yes Prior Therapy Dates: Ongoing Prior Therapy Facilty/Provider(s): [facility] Reason for Treatment: med mang Does patient have an ACCT team?: No Does patient have Intensive In-House Services?  : No Does patient have [facility] services? : No Does patient have P4CC services?: No  ADL Screening (condition at time of admission) Patient's cognitive ability adequate to safely complete daily activities?: Yes Is the patient deaf or have difficulty hearing?: No Does the patient have difficulty seeing, even when wearing glasses/contacts?: No Does the patient have difficulty concentrating, remembering, or making decisions?: Yes Patient able to express need for assistance with ADLs?: Yes Does the patient have difficulty dressing or bathing?: No Independently performs ADLs?: Yes (appropriate for developmental age) Does the patient have difficulty walking or climbing stairs?: No Weakness of Legs: None Weakness of Arms/Hands: None  Home Assistive Devices/Equipment Home Assistive Devices/Equipment: None  Therapy Consults (therapy consults require a physician order) PT Evaluation Needed: No OT Evalulation Needed: No SLP Evaluation Needed: No Abuse/Neglect Assessment (Assessment to be complete while patient is alone) Physical Abuse: Denies Verbal Abuse: Denies Sexual Abuse: Denies Exploitation of patient/patient's resources: Denies Self-Neglect: Denies Values / Beliefs Cultural Requests During Hospitalization: None Spiritual Requests During Hospitalization: None Consults Spiritual Care Consult Needed: No Transition of Care Team Consult Needed: No Advance Directives (For Healthcare) Does Patient Have a Medical Advance Directive?: No Would patient like information on  creating a medical advance directive?: No - Patient declined          Disposition: Case was staffed with Jenne [facility] [name] who recommended patient be observed and monitored.      Disposition Initial Assessment Completed for this Encounter: Yes Disposition of Patient: Admit Type of inpatient treatment program: Adult  On Site Evaluation by:   Reviewed with Physician:    Mamie Nick 11/10/2019 4:13 PM

## 2019-11-10 NOTE — BH Assessment (Signed)
[facility] Assessment Progress Note  Case was staffed with [name] NP who recommended patient be observed and monitored.

## 2019-11-10 NOTE — Progress Notes (Signed)
RN attempted to arouse pt to transfer to [address].  Pt sleeping, sonorous respirations noted.  Skin color good, resp. Even & unlabored. No respiratory distress noted.  Difficult to arouse pt for transfer, will re-attempt later.

## 2019-11-10 NOTE — Progress Notes (Signed)
Pt presents on IVC status accompanied by 3 [employer] officers on arrival to Obs unit. Observed to be paranoid, suspecious believes someone is trying to kill her, hypervigilant and actively responding to internal stimuli 'talking loudly to self. Patient speech is  Pressured loud and non coherent with avertive eye contact and is unable to engage with writer to participate in assessment process at this time. Pt assessed by Provider new orders received and medications administered as ordered  (see emar). Pt was cooperative with Covid Test after multiple prompts post med administration. Q15 minutes safety checks initiated and Pt remain safe on unit at this time.

## 2019-11-10 NOTE — H&P (Signed)
BH Observation Unit Provider Admission PAA/H&P  Patient Identification: [name] [name] MRN:  [id number] Date of Evaluation:  11/10/2019 Chief Complaint:  Schizophrenia (HCC) [F20.9] Principal Diagnosis: <principal problem not specified> Diagnosis:  Active Problems:   Schizoaffective disorder, bipolar type (HCC)   Schizophrenia (HCC)  History of Present Illness: [name] [name] is a 44 year old female with history of schizoaffective disorder, presenting under IVC from [facility]. Per IVC paperwork, patient has been refusing medications including LAI and believes that someone is trying to kill her. She has been threatening toward the staff at the agency. On assessment she is acutely psychotic. She is responding to internal stimuli so severely that she is unable to answer basic questions about orientation, SI/HI/AVH or provide meaningful history. She is crying and talking incoherently to herself with pressured speech. She was admitted to [facility] 10/16/19-10/21/19 and refused LAI at that time. She was discharged on Fanapt 2 mg TID, Risperdal 4 mg BID, Cogentin 1 mg BID, Zoloft 50 mg daily, and trazodone 150 mg QHS. She has history of poor compliance with oral medications at home and did not continue medications. She was seen in [facility] on 11/07/19 for reports of SI and HI. She was restarted on Fanapt, Risperdal, and Zoloft at that time and discharged. Per IVC paperwork, patient has not continued medications. Labwork for this admission is pending. Labwork 11/06/19-11/07/19 with no concerning findings, and UDS from last several admission have been negative.  Associated Signs/Symptoms: Depression Symptoms:  UTA due to severity of patient's psychosis; per IVC paperwork and chart review, patient has recent history of SI (Hypo) Manic Symptoms:  Delusions, Distractibility, Flight of Ideas, Hallucinations, Labiality of Mood, Anxiety Symptoms:  Excessive Worry, Psychotic Symptoms:  Delusions, Hallucinations:  Auditory Paranoia, PTSD Symptoms: UTA- patient acutely psychotic Total Time spent with patient: 30 minutes  Past Psychiatric History: History of schizoaffective disorder with numerous recent hospitalizations and ED visits for psychosis, SI and HI. Poor compliance with oral medications. Seen by CST at [facility].  Is the patient at risk to self? Yes.    Has the patient been a risk to self in the past 6 months? Yes.    Has the patient been a risk to self within the distant past? Yes.    Is the patient a risk to others? Yes.    Has the patient been a risk to others in the past 6 months? Yes.    Has the patient been a risk to others within the distant past? Yes.     Prior Inpatient Therapy:   Prior Outpatient Therapy:    Alcohol Screening:   Substance Abuse History in the last 12 months:  No. Consequences of Substance Abuse: NA Previous Psychotropic Medications: Yes  Psychological Evaluations: No  Past Medical History:  Past Medical History:  Diagnosis Date  . Arthritis   . Depression    No past surgical history on file. Family History: No family history on file. Family Psychiatric History: UTA Tobacco Screening:   Social History:  Social History   Substance and Sexual Activity  Alcohol Use Not Currently   Comment: BAC not available     Social History   Substance and Sexual Activity  Drug Use Not Currently   Comment: UDS not available    Additional Social History: Marital status: (P) Single    Pain Medications: See MAR Prescriptions: See MAR Over the Counter: See MAR History of alcohol / drug use?: No history of alcohol / drug abuse  Allergies:  No Known Allergies Lab Results: No results found for this or any previous visit (from the past 48 hour(s)).  Blood Alcohol level:  Lab Results  Component Value Date   ETH <10 11/06/2019   ETH <10 63/87/5643    Metabolic Disorder Labs:  Lab Results  Component Value Date   HGBA1C  4.8 08/06/2019   MPG 91.06 08/06/2019   MPG 88.19 06/26/2018   Lab Results  Component Value Date   PROLACTIN 28.9 (H) 06/26/2018   Lab Results  Component Value Date   CHOL 160 09/19/2019   TRIG 52 09/19/2019   HDL 46 09/19/2019   CHOLHDL 3.5 09/19/2019   VLDL 10 09/19/2019   LDLCALC 104 (H) 09/19/2019   [place] 90 08/06/2019    Current Medications: Current Facility-Administered Medications  Medication Dose Route Frequency Provider Last Rate Last Admin  . acetaminophen (TYLENOL) tablet 650 mg  650 mg Oral Q4H PRN Mordecai Maes, NP      . benztropine (COGENTIN) tablet 1 mg  1 mg Oral BID Mordecai Maes, NP      . haloperidol (HALDOL) tablet 5 mg  5 mg Oral Q6H PRN [name], MD       Or  . haloperidol lactate (HALDOL) injection 10 mg  10 mg Intramuscular Q6H PRN [name], MD      . LORazepam (ATIVAN) injection 4 mg  4 mg Intramuscular Once [name], MD      . ondansetron [facility]) tablet 4 mg  4 mg Oral Q8H PRN Mordecai Maes, NP      . risperiDONE (RISPERDAL) tablet 4 mg  4 mg Oral BID Mordecai Maes, NP      . sertraline (ZOLOFT) tablet 50 mg  50 mg Oral Daily Mordecai Maes, NP      . traZODone (DESYREL) tablet 150 mg  150 mg Oral QHS PRN,MR X 1 Mordecai Maes, NP       PTA Medications: Medications Prior to Admission  Medication Sig Dispense Refill Last Dose  . azithromycin (ZITHROMAX) 250 MG tablet Take 250-500 mg by mouth See admin instructions. 500mg  on day one and 250mg  on days 2 through 5.     . benzonatate (TESSALON) 100 MG capsule Take 1 capsule (100 mg total) by mouth 3 (three) times daily as needed for cough. (Patient not taking: Reported on 10/10/2019) 20 capsule 0   . benztropine (COGENTIN) 1 MG tablet Take 1 tablet (1 mg total) by mouth 2 (two) times daily. 60 tablet 3   . iloperidone (FANAPT) 4 MG TABS tablet Take 0.5 tablets (2 mg total) by mouth 3 (three) times daily. (Patient taking differently: Take 2 mg by mouth 2 (two) times daily. ) 45  tablet 2   . risperiDONE (RISPERDAL) 4 MG tablet Take 1 tablet (4 mg total) by mouth 2 (two) times daily. 60 tablet 2   . sertraline (ZOLOFT) 50 MG tablet Take 1 tablet (50 mg total) by mouth daily. 30 tablet 0   . traZODone (DESYREL) 150 MG tablet Take 1 tablet (150 mg total) by mouth at bedtime as needed and may repeat dose one time if needed for sleep. 90 tablet 1     Musculoskeletal: Strength & Muscle Tone: within normal limits Gait & Station: normal Patient leans: N/A  Psychiatric Specialty Exam: See [place] Screening Exam for Physical Exam  See [place] Screening Exam for ROS  Blood pressure (P) 125/72, pulse (P) 93, temperature (P) 98 F (36.7 C), temperature source (P)  Oral, resp. rate (P) 20, SpO2 (P) 99 %.There is no height or weight on file to calculate BMI.  See Behavioral Health Medical Screening Exam for MSE      Treatment Plan Summary: Daily contact with patient to assess and evaluate symptoms and progress in treatment and Medication management   Admit for overnight observation. Continue Risperdal 4 mg PO BID for psychosis Continue Zoloft 50 mg PO daily for depression Continue Cogentin 1 mg PO BID for EPS Continue trazodone 150 mg PO QHS PRN insomnia Continue Haldol PO/IM Q6HR PRN agitation  Observation Level/Precautions:  15 minute checks Laboratory:  CBC Chemistry Profile HbAIC HCG UDS UA    Aldean Baker, NP 1/4/20214:00 PM

## 2019-11-10 NOTE — Progress Notes (Signed)
Patient ID: [name] [name], female   DOB: 06/20/1976, 44 y.o.   MRN: [id number] Pt remains sleeping at present, no distress noted, calm & cooperative.  Skin color good, resp. Even & unlabored.  Monitoring for safety.

## 2019-11-11 DIAGNOSIS — F209 Schizophrenia, unspecified: Secondary | ICD-10-CM | POA: Diagnosis present

## 2019-11-11 DIAGNOSIS — G4700 Insomnia, unspecified: Secondary | ICD-10-CM | POA: Diagnosis present

## 2019-11-11 DIAGNOSIS — M1990 Unspecified osteoarthritis, unspecified site: Secondary | ICD-10-CM | POA: Diagnosis present

## 2019-11-11 DIAGNOSIS — Z79899 Other long term (current) drug therapy: Secondary | ICD-10-CM | POA: Diagnosis not present

## 2019-11-11 DIAGNOSIS — Z20822 Contact with and (suspected) exposure to covid-19: Secondary | ICD-10-CM | POA: Diagnosis present

## 2019-11-11 DIAGNOSIS — F250 Schizoaffective disorder, bipolar type: Secondary | ICD-10-CM | POA: Diagnosis present

## 2019-11-11 DIAGNOSIS — Z9119 Patient's noncompliance with other medical treatment and regimen: Secondary | ICD-10-CM | POA: Diagnosis not present

## 2019-11-11 DIAGNOSIS — R45851 Suicidal ideations: Secondary | ICD-10-CM | POA: Diagnosis not present

## 2019-11-11 DIAGNOSIS — F308 Other manic episodes: Secondary | ICD-10-CM | POA: Diagnosis present

## 2019-11-11 NOTE — H&P (Signed)
Psychiatric Admission Assessment Adult  Patient Identification: [name] [name] MRN:  [id number] Date of Evaluation:  11/11/2019 Chief Complaint:  Schizophrenia (HCC) [F20.9] Principal Diagnosis: <principal problem not specified> Diagnosis:  Active Problems:   Schizoaffective disorder, bipolar type (HCC)   Schizophrenia (HCC)  History of Present Illness:   [name] [name] is a 44 year old treatment resistant schizophrenic patient who has had baseline level of fixed delusions, however she re- presented yesterday in a very disorganized state of mind.  She was literally talking nonstop but babbling nonsensically-required IM Ativan/Geodon and slept pretty well and woke up this morning a little more organized however she continues to have residual symptoms  Though she is alert and sluggish and oriented to person place situation, not exact date she continues to believe Jesus is "overwhelming her" and this is a fixed delusion that Jesus is somehow doing harm to her either physically/psychologically so forth. At the same time she is able to give coherent sentences now even though she is sluggish.  She denies wanting to harm self or others.  Her chart is extensive and the majority of symptoms tend to repeat themselves, recent visits were not elaborated here but are in the chart under chart review  Associated Signs/Symptoms: Depression Symptoms:  insomnia, psychomotor agitation, (Hypo) Manic Symptoms:  Delusions, Distractibility, Flight of Ideas, Irritable Mood, Anxiety Symptoms:  n/a Psychotic Symptoms:  Delusions, PTSD Symptoms: NA Total Time spent with patient: 45 minutes  Past Psychiatric History: exstensive  Is the patient at risk to self? Yes.    Has the patient been a risk to self in the past 6 months? Yes.    Has the patient been a risk to self within the distant past? Yes.    Is the patient a risk to others? No.  Has the patient been a risk to others in the past 6 months? No.  Has the  patient been a risk to others within the distant past? No.   Prior Inpatient Therapy: Prior Inpatient Therapy: Yes Prior Therapy Dates: 2020, 2019, 2018 Prior Therapy Facilty/Provider(s): [facility], [facility], [facility] Reason for Treatment: MH issues Prior Outpatient Therapy: Prior Outpatient Therapy: Yes Prior Therapy Dates: Ongoing Prior Therapy Facilty/Provider(s): [facility] Reason for Treatment: med mang Does patient have an ACCT team?: No Does patient have Intensive In-House Services?  : No Does patient have [facility] services? : No Does patient have P4CC services?: No  Alcohol Screening:   Substance Abuse History in the last 12 months:  Yes.   Consequences of Substance Abuse: NA Previous Psychotropic Medications: Yes  Psychological Evaluations: No  Past Medical History:  Past Medical History:  Diagnosis Date  . Arthritis   . Depression    History reviewed. No pertinent surgical history. Family History: History reviewed. No pertinent family history. Family Psychiatric  History: see eval Tobacco Screening:   Social History:  Social History   Substance and Sexual Activity  Alcohol Use Not Currently   Comment: BAC not available     Social History   Substance and Sexual Activity  Drug Use Not Currently   Comment: UDS not available    Additional Social History: Marital status: Single    Pain Medications: See MAR Prescriptions: See MAR Over the Counter: See MAR History of alcohol / drug use?: No history of alcohol / drug abuse                    Allergies:  No Known Allergies Lab Results:  Results for orders placed or performed  during the hospital encounter of 11/10/19 (from the past 48 hour(s))  Respiratory Panel by RT PCR (Flu A&B, Covid) - Nasopharyngeal Swab     Status: None   Collection Time: 11/10/19  3:51 PM   Specimen: Nasopharyngeal Swab  Result Value Ref Range   SARS Coronavirus 2 by RT PCR NEGATIVE NEGATIVE    Comment: (NOTE) SARS-CoV-2  target nucleic acids are NOT DETECTED. The SARS-CoV-2 RNA is generally detectable in upper respiratoy specimens during the acute phase of infection. The lowest concentration of SARS-CoV-2 viral copies this assay can detect is 131 copies/mL. A negative result does not preclude SARS-Cov-2 infection and should not be used as the sole basis for treatment or other patient management decisions. A negative result may occur with  improper specimen collection/handling, submission of specimen other than nasopharyngeal swab, presence of viral mutation(s) within the areas targeted by this assay, and inadequate number of viral copies (<131 copies/mL). A negative result must be combined with clinical observations, patient history, and epidemiological information. The expected result is Negative. Fact Sheet for Patients:  PinkCheek.be Fact Sheet for Healthcare Providers:  GravelBags.it This test is not yet ap proved or cleared by the [place] FDA and  has been authorized for detection and/or diagnosis of SARS-CoV-2 by FDA under an Emergency Use Authorization (EUA). This EUA will remain  in effect (meaning this test can be used) for the duration of the COVID-19 declaration under Section 564(b)(1) of the Act, 21 U.S.C. section 360bbb-3(b)(1), unless the authorization is terminated or revoked sooner.    Influenza A by PCR NEGATIVE NEGATIVE   Influenza B by PCR NEGATIVE NEGATIVE    Comment: (NOTE) The Xpert Xpress SARS-CoV-2/FLU/RSV assay is intended as an aid in  the diagnosis of influenza from Nasopharyngeal swab specimens and  should not be used as a sole basis for treatment. Nasal washings and  aspirates are unacceptable for Xpert Xpress SARS-CoV-2/FLU/RSV  testing. Fact Sheet for Patients: PinkCheek.be Fact Sheet for Healthcare Providers: GravelBags.it This test is not yet  approved or cleared by the [place] FDA and  has been authorized for detection and/or diagnosis of SARS-CoV-2 by  FDA under an Emergency Use Authorization (EUA). This EUA will remain  in effect (meaning this test can be used) for the duration of the  Covid-19 declaration under Section 564(b)(1) of the Act, 21  U.S.C. section 360bbb-3(b)(1), unless the authorization is  terminated or revoked. Performed at [facility], [place] [address] [place], [place] [address]     Blood Alcohol level:  Lab Results  Component Value Date   [facility] <10 11/06/2019   ETH <10 56/43/3295    Metabolic Disorder Labs:  Lab Results  Component Value Date   HGBA1C 4.8 08/06/2019   MPG 91.06 08/06/2019   MPG 88.19 06/26/2018   Lab Results  Component Value Date   PROLACTIN 28.9 (H) 06/26/2018   Lab Results  Component Value Date   CHOL 160 09/19/2019   TRIG 52 09/19/2019   HDL 46 09/19/2019   CHOLHDL 3.5 09/19/2019   VLDL 10 09/19/2019   LDLCALC 104 (H) 09/19/2019   [place] 90 08/06/2019    Current Medications: Current Facility-Administered Medications  Medication Dose Route Frequency Provider Last Rate Last Admin  . acetaminophen (TYLENOL) tablet 650 mg  650 mg Oral Q4H PRN Mordecai Maes, NP      . benztropine (COGENTIN) tablet 1 mg  1 mg Oral BID Mordecai Maes, NP   1 mg at 11/10/19 1720  .  haloperidol (HALDOL) tablet 5 mg  5 mg Oral Q6H PRN Malvin Johns, MD       Or  . haloperidol lactate (HALDOL) injection 10 mg  10 mg Intramuscular Q6H PRN Malvin Johns, MD   10 mg at 11/10/19 1613  . ondansetron (ZOFRAN) tablet 4 mg  4 mg Oral Q8H PRN Denzil Magnuson, NP      . risperiDONE (RISPERDAL) tablet 4 mg  4 mg Oral BID Denzil Magnuson, NP   4 mg at 11/10/19 1649  . sertraline (ZOLOFT) tablet 50 mg  50 mg Oral Daily Denzil Magnuson, NP   50 mg at 11/10/19 1649  . traZODone (DESYREL) tablet 150 mg  150 mg Oral QHS PRN,MR X 1 Denzil Magnuson, NP       PTA  Medications: Medications Prior to Admission  Medication Sig Dispense Refill Last Dose  . acetaminophen (TYLENOL) 325 MG tablet Take 650 mg by mouth every 6 (six) hours as needed (For menstrual cycle.).   11/10/2019  . benztropine (COGENTIN) 1 MG tablet Take 1 tablet (1 mg total) by mouth 2 (two) times daily. 60 tablet 3 11/06/2019  . iloperidone (FANAPT) 4 MG TABS tablet Take 0.5 tablets (2 mg total) by mouth 3 (three) times daily. (Patient taking differently: Take 2 mg by mouth 2 (two) times daily. ) 45 tablet 2 11/06/2019  . risperiDONE (RISPERDAL) 4 MG tablet Take 1 tablet (4 mg total) by mouth 2 (two) times daily. 60 tablet 2 11/06/2019  . sertraline (ZOLOFT) 50 MG tablet Take 1 tablet (50 mg total) by mouth daily. 30 tablet 0 11/06/2019  . traZODone (DESYREL) 150 MG tablet Take 1 tablet (150 mg total) by mouth at bedtime as needed and may repeat dose one time if needed for sleep. 90 tablet 1 11/06/2019  . azithromycin (ZITHROMAX) 250 MG tablet Take 250-500 mg by mouth See admin instructions. 500mg  on day one and 250mg  on days 2 through 5.       Musculoskeletal: Strength & Muscle Tone: within normal limits Gait & Station: normal Patient leans: N/A  Psychiatric Specialty Exam: Physical Exam  Nursing note and vitals reviewed. Constitutional: She appears well-developed and well-nourished.  Cardiovascular: Normal rate and regular rhythm.    Review of Systems  Constitutional: Negative.   Respiratory: Negative.   Cardiovascular: Negative.   Endocrine: Negative.   Genitourinary: Negative.   Neurological: Negative.     Blood pressure 107/74, pulse (!) 105, temperature 98 F (36.7 C), temperature source Oral, resp. rate 20, height 4\' 10"  (1.473 m), weight 48.1 kg, SpO2 99 %.Body mass index is 22.15 kg/m.  General Appearance: Disheveled  Eye Contact:  Poor  Speech:  Garbled  Volume:  Decreased  Mood:  Dysphoric  Affect:  Restricted  Thought Process:  Disorganized and Irrelevant   Orientation:  Other:  Person place situation  Thought Content:  Illogical, Delusions and Paranoid Ideation  Suicidal Thoughts:  No  Homicidal Thoughts:  No  Memory:  Immediate;   Poor Recent;   Poor Remote;   Fair  Judgement:  Impaired  Insight:  Shallow  Psychomotor Activity:  Normal  Concentration:  Concentration: Fair and Attention Span: Poor  Recall:  Poor  Fund of Knowledge:  Fair  Language:  Fair  Akathisia:  Negative  Handed:  Right  AIMS (if indicated):     Assets:  Communication Skills Leisure Time Physical Health  ADL's:  Intact  Cognition:  WNL  Sleep:       Treatment Plan Summary: Daily  contact with patient to assess and evaluate symptoms and progress in treatment and Medication management  Observation Level/Precautions:  Elopement 15 minute checks  Laboratory:  UDS  Psychotherapy: Reality based  Medications: Resume Risperdal  Consultations: Not necessary  Discharge Concerns: Longer-term stability  Estimated LOS: 5-7  Other:     Physician Treatment Plan for Primary Diagnosis: For treatment resistant schizophrenia resume Risperdal and reality based therapy had received some acute medications yesterday with relief  Long Term Goal(s): Improvement in symptoms so as ready for discharge  Short Term Goals: Ability to identify changes in lifestyle to reduce recurrence of condition will improve, Ability to disclose and discuss suicidal ideas, Ability to demonstrate self-control will improve, Ability to identify and develop effective coping behaviors will improve and Ability to maintain clinical measurements within normal limits will improve  Physician Treatment Plan for Secondary Diagnosis: Active Problems:   Schizoaffective disorder, bipolar type (HCC)   Schizophrenia (HCC)  Long Term Goal(s): Improvement in symptoms so as ready for discharge  Short Term Goals: Ability to disclose and discuss suicidal ideas, Ability to demonstrate self-control will improve,  Ability to identify and develop effective coping behaviors will improve and Ability to maintain clinical measurements within normal limits will improve  I certify that inpatient services furnished can reasonably be expected to improve the patient's condition.    Malvin Johns, MD 1/5/20211:19 PM

## 2019-11-11 NOTE — Discharge Summary (Addendum)
  Patient to be transferred to [facility] inpatient for psychiatric treatment and stabilization  Attest to NP Note

## 2019-11-11 NOTE — [facility] Suicide Risk Assessment (Signed)
[facility] Admission Suicide Risk Assessment   Nursing information obtained from:  Patient, Review of record Demographic factors:  Low socioeconomic status Current Mental Status:  NA Loss Factors:  NA Historical Factors:  NA Risk Reduction Factors:  NA  Total Time spent with patient: 45 minutes Principal Problem: Exacerbation of psychotic disorder Diagnosis:  Active Problems:   Schizoaffective disorder, bipolar type (HCC)   Schizophrenia (HCC)  Subjective Data: Resented in a disorganized state though some improved but not yet baseline  Continued Clinical Symptoms:    The "Alcohol Use Disorders Identification Test", Guidelines for Use in Primary Care, Second Edition.  World Science writer [facility]). Score between 0-7:  no or low risk or alcohol related problems. Score between 8-15:  moderate risk of alcohol related problems. Score between 16-19:  high risk of alcohol related problems. Score 20 or above:  warrants further diagnostic evaluation for alcohol dependence and treatment.   CLINICAL FACTORS:   Schizophrenia:   Paranoid or undifferentiated type  Musculoskeletal: Strength & Muscle Tone: within normal limits Gait & Station: normal Patient leans: N/A  Psychiatric Specialty Exam: Physical Exam  Nursing note and vitals reviewed. Constitutional: She appears well-developed and well-nourished.  Cardiovascular: Normal rate and regular rhythm.    Review of Systems  Constitutional: Negative.   Respiratory: Negative.   Cardiovascular: Negative.   Endocrine: Negative.   Genitourinary: Negative.   Neurological: Negative.     Blood pressure 107/74, pulse (!) 105, temperature 98 F (36.7 C), temperature source Oral, resp. rate 20, height 4\' 10"  (1.473 m), weight 48.1 kg, SpO2 99 %.Body mass index is 22.15 kg/m.  General Appearance: Disheveled  Eye Contact:  Poor  Speech:  Garbled  Volume:  Decreased  Mood:  Dysphoric  Affect:  Restricted  Thought Process:  Disorganized and  Irrelevant  Orientation:  Other:  Person place situation  Thought Content:  Illogical, Delusions and Paranoid Ideation  Suicidal Thoughts:  No  Homicidal Thoughts:  No  Memory:  Immediate;   Poor Recent;   Poor Remote;   Fair  Judgement:  Impaired  Insight:  Shallow  Psychomotor Activity:  Normal  Concentration:  Concentration: Fair and Attention Span: Poor  Recall:  Poor  Fund of Knowledge:  Fair  Language:  Fair  Akathisia:  Negative  Handed:  Right  AIMS (if indicated):     Assets:  Communication Skills Leisure Time Physical Health  ADL's:  Intact  Cognition:  WNL  Sleep:         COGNITIVE FEATURES THAT CONTRIBUTE TO RISK:  Loss of executive function    SUICIDE RISK:   Minimal: No identifiable suicidal ideation.  Patients presenting with no risk factors but with morbid ruminations; may be classified as minimal risk based on the severity of the depressive symptoms  PLAN OF CARE: see eval   I certify that inpatient services furnished can reasonably be expected to improve the patient's condition.   , MD 11/11/2019, 1:39 PM

## 2019-11-11 NOTE — Progress Notes (Signed)
  Pt is black female of 43 years,  Patient drowsy and oriented. Patient needed constant stimulation for limited response. Pt stated she slept "good" last night, unsure of LBM,  Appetite reported as "poor"  refused breakfast,  energy level reported "low" refused to get up for assessment. She reports no pain today.     Patient denies SI, HI, and AVH at this time. Marland Kitchen    A- Scheduled medications refused by patient, Support and encouragement provided.  Routine safety checks conducted every 15 minutes.  Patient agreed to notify staff with problems or concerns.    Patient compliant with medications and treatment plan. Patient non-receptive and calm,  Patient not interacting with others on the unit.  Patient remains safe at this time.   Einar Crow. Melvyn Neth MSN, RN, [facility] [phone]

## 2019-11-11 NOTE — Progress Notes (Signed)
Adult Psychoeducational Group Note  Date:  11/11/2019 Time:  10:28 PM  Group Topic/Focus:  Wrap-Up Group:   The focus of this group is to help patients review their daily goal of treatment and discuss progress on daily workbooks.  Participation Level:  Did Not Attend  Participation Quality:  Did Not Attend  Affect:  Did Not Attend  Cognitive:  Did Not Attend  Insight: None  Engagement in Group:  Did Not Attend  Modes of Intervention:  Did Not Attend  Additional Comments:  Pt did not attend evening wrap up group tonight.  Felipa Furnace 11/11/2019, 10:28 PM

## 2019-11-11 NOTE — BH Assessment (Signed)
[facility] Assessment Progress Note  Per Nelly Rout, MD, this pt requires psychiatric hospitalization.  Percell Boston, RN has assigned pt to [facility] Rm [id number].  Pt presents under IVC initiated by pt's therapist, and upheld by Nehemiah Massed, MD, and IVC documents have been sent to [facility].  Pt's nurse has been notified.   Doylene Canning, [place] Behavioral Health Coordinator [phone]

## 2019-11-11 NOTE — Progress Notes (Signed)
[place] NOVEL CORONAVIRUS (COVID-19) DAILY CHECK-OFF SYMPTOMS - answer yes or no to each - every day NO YES  Have you had a fever in the past 24 hours?  . Fever (Temp > 37.80C / 100F) X   Have you had any of these symptoms in the past 24 hours? . New Cough .  Sore Throat  .  Shortness of Breath .  Difficulty Breathing .  Unexplained Body Aches   X   Have you had any one of these symptoms in the past 24 hours not related to allergies?   . Runny Nose .  Nasal Congestion .  Sneezing   X   If you have had runny nose, nasal congestion, sneezing in the past 24 hours, has it worsened?  X   EXPOSURES - check yes or no X   Have you traveled outside the state in the past 14 days?  X   Have you been in contact with someone with a confirmed diagnosis of COVID-19 or PUI in the past 14 days without wearing appropriate PPE?  X   Have you been living in the same home as a person with confirmed diagnosis of COVID-19 or a PUI (household contact)?    X   Have you been diagnosed with COVID-19?    X              What to do next: Answered NO to all: Answered YES to anything:   Proceed with unit schedule Follow the [facility] Inpatient Flowsheet.   

## 2019-11-12 NOTE — Progress Notes (Signed)
Progress note  D: pt found in bed; compliant with medication administration. Pt did decline their Risperdal, stating that this medication upset their stomach. Pt states they slept well. Pt rates their depression/hopelessness/anxiety a 1/1/1 out of 10 respectively. Pt as complaints of menstrual cramps that they rate at a 4-5 out of 10. Pt states their goal for today is to continue to eat right and rest because of the cramps. Pt has been viewed in the milieu. Pt seems to be blocking at times. Pt denies si/hi/ah/vh and verbally agrees to approach staff if these become apparent or before harming themself/others while at [facility].  A: Pt provided support and encouragement. Pt given medication per protocol and standing orders. Q66m safety checks implemented and continued.  R: Pt safe on the unit. Will continue to monitor.

## 2019-11-12 NOTE — Progress Notes (Signed)
BH MD/PA/NP OP Progress Note  11/12/2019 9:17 AM [name] [name]  MRN:  [id number]  Chief Complaint: Schizophrenia (HCC) [F20.9} HPI: [name] [name] is a 44 year old  Resistant schizophrenic patient who has had baseline level of fixed delusions, who presented two days ago in a very disorganized state of mind. Today she states she slept very well without taking her usual trazodone medication, and has goal directed and more organized thought processes this morning. She denies any visual or auditory hallucinations.  She is oriented to person, place, and situation. She states several concerns regarding her washing machine and dryer being delivered as well as starting school next week. She is able to give coherent sentences and has an organized train of thought, but she state she no longer wants to take her Abilify, Risperdal, or haldol because they are "too much" for her. She states that she is currently on her menstrual cycle, and the medications are affecting her pain and "ability to think through things." She states she does not mind taking her Zoloft and trazodone, but she refuses to take other medications. She states "she just wants to go back home, because she has a lot of things to do at home." She states this was a huge misunderstanding.  She denies any SI/HI.    Visit Diagnosis: Active Problems: Schizoaffective disorder, bipolar type (HCC), Schizophrenia (HCC) Past Psychiatric History: extensive  Past Medical History:  Past Medical History:  Diagnosis Date  . Arthritis   . Depression    History reviewed. No pertinent surgical history.  Family Psychiatric History: see eval  Family History: History reviewed. No pertinent family history.  Social History:  Social History   Socioeconomic History  . Marital status: Single    Spouse name: Not on file  . Number of children: Not on file  . Years of education: Not on file  . Highest education level: Not on file  Occupational History  .  Occupation: Unknown  Tobacco Use  . Smoking status: Never Smoker  . Smokeless tobacco: Never Used  Substance and Sexual Activity  . Alcohol use: Not Currently    Comment: BAC not available  . Drug use: Not Currently    Comment: UDS not available  . Sexual activity: Not Currently  Other Topics Concern  . Not on file  Social History Narrative   Pt stated that she lives in [place], and that she lives alone.     Social Determinants of Health   Financial Resource Strain:   . Difficulty of Paying Living Expenses: Not on file  Food Insecurity:   . Worried About Programme researcher, broadcasting/film/video in the Last Year: Not on file  . Ran Out of Food in the Last Year: Not on file  Transportation Needs:   . Lack of Transportation (Medical): Not on file  . Lack of Transportation (Non-Medical): Not on file  Physical Activity:   . Days of Exercise per Week: Not on file  . Minutes of Exercise per Session: Not on file  Stress:   . Feeling of Stress : Not on file  Social Connections:   . Frequency of Communication with Friends and Family: Not on file  . Frequency of Social Gatherings with Friends and Family: Not on file  . Attends Religious Services: Not on file  . Active Member of Clubs or Organizations: Not on file  . Attends Banker Meetings: Not on file  . Marital Status: Not on file    Allergies: No Known Allergies  Metabolic  Disorder Labs: Lab Results  Component Value Date   HGBA1C 4.8 08/06/2019   MPG 91.06 08/06/2019   MPG 88.19 06/26/2018   Lab Results  Component Value Date   PROLACTIN 28.9 (H) 06/26/2018   Lab Results  Component Value Date   CHOL 160 09/19/2019   TRIG 52 09/19/2019   HDL 46 09/19/2019   CHOLHDL 3.5 09/19/2019   VLDL 10 09/19/2019   LDLCALC 104 (H) 09/19/2019   LDLCALC 90 08/06/2019   Lab Results  Component Value Date   TSH 1.329 08/06/2019    Therapeutic Level Labs: No results found for: LITHIUM No results found for: VALPROATE No components  found for:  CBMZ  Current Medications: Current Facility-Administered Medications  Medication Dose Route Frequency Provider Last Rate Last Admin  . acetaminophen (TYLENOL) tablet 650 mg  650 mg Oral Q4H PRN Denzil Magnuson, NP   650 mg at 11/12/19 0914  . benztropine (COGENTIN) tablet 1 mg  1 mg Oral BID Denzil Magnuson, NP   1 mg at 11/11/19 1805  . haloperidol (HALDOL) tablet 5 mg  5 mg Oral Q6H PRN Malvin Johns, MD       Or  . haloperidol lactate (HALDOL) injection 10 mg  10 mg Intramuscular Q6H PRN Malvin Johns, MD   10 mg at 11/10/19 1613  . ondansetron (ZOFRAN) tablet 4 mg  4 mg Oral Q8H PRN Denzil Magnuson, NP      . risperiDONE (RISPERDAL) tablet 4 mg  4 mg Oral BID Denzil Magnuson, NP   4 mg at 11/11/19 1805  . sertraline (ZOLOFT) tablet 50 mg  50 mg Oral Daily Denzil Magnuson, NP   50 mg at 11/12/19 0745  . traZODone (DESYREL) tablet 150 mg  150 mg Oral QHS PRN,MR X 1 Denzil Magnuson, NP   150 mg at 11/11/19 2101     Musculoskeletal: Strength & Muscle Tone: within normal limits Gait & Station: normal Patient leans: N/A   Psychiatric Specialty Exam: Review of Systems  Blood pressure 99/67, pulse (!) 119, temperature 98 F (36.7 C), temperature source Oral, resp. rate 20, height 4\' 10"  (1.473 m), weight 48.1 kg, SpO2 99 %.Body mass index is 22.15 kg/m.  General Appearance: Casual  Eye Contact:  Good  Speech:  Clear and Coherent and Normal Rate  Volume:  Normal  Mood:  Euthymic  Affect:  Appropriate  Thought Process:  Goal Directed  Orientation:  Full (Time, Place, and Person)  Thought Content: Logical   Suicidal Thoughts:  No  Homicidal Thoughts:  No  Memory:  Immediate;   Fair  Judgement:  Fair  Insight:  Fair  Psychomotor Activity:  Restlessness  Concentration:  Attention Span: Good  Recall:  Fair  Fund of Knowledge: Good  Language: Good  Akathisia:  No  Handed:  Right  AIMS (if indicated): done  Assets:  Desire for Improvement Housing  ADL's:  Intact   Cognition: WNL  Sleep:  Fair   Screenings: AIMS     Ad[facility] (Current) [facility]) [facility] (Discharged) from [facility] (Discharged) from OP Visit from 09[facility] (Discharged[facility]  AIMS Total Score  0  0  0  0  3    AUDIT     Admission (Discharged) from 10/16/2019 in [facility]  [place] [facility] Admission (Discharged) from OP Visit from 10/02/2019 in [place] Admission (Discharged) from OP Visit from 09/19/2019 in [place] Admission (Discharged) from 09/02/2018 in [facility] Admission (Discharged) from 06/24/2018 in [facility] [facility]  Alcohol Use Disorder Identification Test Final Score (AUDIT)  0  0  0  0  0       Assessment and Plan:  [name] [name] train of thought is improving and she denies experiencing any fixed delusions or hallucinations at this time.  Plan to reeducate pt on importance of medication adherence and continue current medication regimen.   Deanna Artis, Medical Student     11/12/2019, 9:17 AM   .

## 2019-11-12 NOTE — Tx Team (Signed)
Interdisciplinary Treatment and Diagnostic Plan Update  11/12/2019 Time of Session: 10:00am [name] [name] MRN: [id number]  Principal Diagnosis: <principal problem not specified>  Secondary Diagnoses: Active Problems:   Schizoaffective disorder, bipolar type (HCC)   Schizophrenia ([place])   Current Medications:  Current Facility-Administered Medications  Medication Dose Route Frequency Provider Last Rate Last Admin  . acetaminophen (TYLENOL) tablet 650 mg  650 mg Oral Q4H PRN Mordecai Maes, NP   650 mg at 11/12/19 0914  . benztropine (COGENTIN) tablet 1 mg  1 mg Oral BID Mordecai Maes, NP   1 mg at 11/11/19 1805  . haloperidol (HALDOL) tablet 5 mg  5 mg Oral Q6H PRN [name], MD       Or  . haloperidol lactate (HALDOL) injection 10 mg  10 mg Intramuscular Q6H PRN [name], MD   10 mg at 11/10/19 1613  . ondansetron (ZOFRAN) tablet 4 mg  4 mg Oral Q8H PRN Mordecai Maes, NP      . risperiDONE (RISPERDAL) tablet 4 mg  4 mg Oral BID Mordecai Maes, NP   4 mg at 11/11/19 1805  . sertraline (ZOLOFT) tablet 50 mg  50 mg Oral Daily Mordecai Maes, NP   50 mg at 11/12/19 0745  . traZODone (DESYREL) tablet 150 mg  150 mg Oral QHS PRN,MR X 1 Mordecai Maes, NP   150 mg at 11/11/19 2101   PTA Medications: Medications Prior to Admission  Medication Sig Dispense Refill Last Dose  . acetaminophen (TYLENOL) 325 MG tablet Take 650 mg by mouth every 6 (six) hours as needed (For menstrual cycle.).   11/10/2019  . benztropine (COGENTIN) 1 MG tablet Take 1 tablet (1 mg total) by mouth 2 (two) times daily. 60 tablet 3 11/06/2019  . iloperidone (FANAPT) 4 MG TABS tablet Take 0.5 tablets (2 mg total) by mouth 3 (three) times daily. (Patient taking differently: Take 2 mg by mouth 2 (two) times daily. ) 45 tablet 2 11/06/2019  . risperiDONE (RISPERDAL) 4 MG tablet Take 1 tablet (4 mg total) by mouth 2 (two) times daily. 60 tablet 2 11/06/2019  . sertraline (ZOLOFT) 50 MG tablet Take 1 tablet (50  mg total) by mouth daily. 30 tablet 0 11/06/2019  . traZODone (DESYREL) 150 MG tablet Take 1 tablet (150 mg total) by mouth at bedtime as needed and may repeat dose one time if needed for sleep. 90 tablet 1 11/06/2019  . azithromycin (ZITHROMAX) 250 MG tablet Take 250-500 mg by mouth See admin instructions. '500mg'$  on day one and '250mg'$  on days 2 through 5.       Patient Stressors:    Patient Strengths:    Treatment Modalities: Medication Management, Group therapy, Case management,  1 to 1 session with clinician, Psychoeducation, Recreational therapy.   Physician Treatment Plan for Primary Diagnosis: <principal problem not specified> Long Term Goal(s): Improvement in symptoms so as ready for discharge Improvement in symptoms so as ready for discharge   Short Term Goals: Ability to identify changes in lifestyle to reduce recurrence of condition will improve Ability to disclose and discuss suicidal ideas Ability to demonstrate self-control will improve Ability to identify and develop effective coping behaviors will improve Ability to maintain clinical measurements within normal limits will improve Ability to disclose and discuss suicidal ideas Ability to demonstrate self-control will improve Ability to identify and develop effective coping behaviors will improve Ability to maintain clinical measurements within normal limits will improve  Medication Management: Evaluate patient's response, side effects, and tolerance of  medication regimen.  Therapeutic Interventions: 1 to 1 sessions, Unit Group sessions and Medication administration.  Evaluation of Outcomes: Not Met  Physician Treatment Plan for Secondary Diagnosis: Active Problems:   Schizoaffective disorder, bipolar type ([place])   Schizophrenia ([place])  Long Term Goal(s): Improvement in symptoms so as ready for discharge Improvement in symptoms so as ready for discharge   Short Term Goals: Ability to identify changes in lifestyle to  reduce recurrence of condition will improve Ability to disclose and discuss suicidal ideas Ability to demonstrate self-control will improve Ability to identify and develop effective coping behaviors will improve Ability to maintain clinical measurements within normal limits will improve Ability to disclose and discuss suicidal ideas Ability to demonstrate self-control will improve Ability to identify and develop effective coping behaviors will improve Ability to maintain clinical measurements within normal limits will improve     Medication Management: Evaluate patient's response, side effects, and tolerance of medication regimen.  Therapeutic Interventions: 1 to 1 sessions, Unit Group sessions and Medication administration.  Evaluation of Outcomes: Not Met   RN Treatment Plan for Primary Diagnosis: <principal problem not specified> Long Term Goal(s): Knowledge of disease and therapeutic regimen to maintain health will improve  Short Term Goals: Ability to participate in decision making will improve, Ability to verbalize feelings will improve, Ability to disclose and discuss suicidal ideas, Ability to identify and develop effective coping behaviors will improve and Compliance with prescribed medications will improve  Medication Management: RN will administer medications as ordered by provider, will assess and evaluate patient's response and provide education to patient for prescribed medication. RN will report any adverse and/or side effects to prescribing provider.  Therapeutic Interventions: 1 on 1 counseling sessions, Psychoeducation, Medication administration, Evaluate responses to treatment, Monitor vital signs and CBGs as ordered, Perform/monitor CIWA, COWS, AIMS and Fall Risk screenings as ordered, Perform wound care treatments as ordered.  Evaluation of Outcomes: Not Met   LCSW Treatment Plan for Primary Diagnosis: <principal problem not specified> Long Term Goal(s): Safe  transition to appropriate next level of care at discharge, Engage patient in therapeutic group addressing interpersonal concerns.  Short Term Goals: Engage patient in aftercare planning with referrals and resources and Increase skills for wellness and recovery  Therapeutic Interventions: Assess for all discharge needs, 1 to 1 time with Social worker, Explore available resources and support systems, Assess for adequacy in community support network, Educate family and significant other(s) on suicide prevention, Complete Psychosocial Assessment, Interpersonal group therapy.  Evaluation of Outcomes: Not Met   Progress in Treatment: Attending groups: No. Participating in groups: No. Taking medication as prescribed: Yes. Toleration medication: Yes. Family/Significant other contact made: No, will contact:  will contact if given consent to contact Patient understands diagnosis: No. Discussing patient identified problems/goals with staff: Yes. Medical problems stabilized or resolved: Yes. Denies suicidal/homicidal ideation: Yes. Issues/concerns per patient self-inventory: No. Other:   New problem(s) identified: No, Describe:  None  New Short Term/Long Term Goal(s): Medication stabilization, elimination of SI thoughts, and development of a comprehensive mental wellness plan.   Patient Goals:    Discharge Plan or Barriers: CSW will continue to follow up for appropriate referrals and possible discharge planning  Reason for Continuation of Hospitalization: Delusions  Medication stabilization  Estimated Length of Stay: 2-3 days   Attendees: Patient: 11/12/2019   Physician: Dr. [name], MD 11/12/2019   Nursing: Legrand Como, RN  11/12/2019   RN Care Manager: 11/12/2019   Social Worker: Ardelle Anton, LCSW  11/12/2019  Recreational Therapist:  11/12/2019   Other:  11/12/2019   Other:  11/12/2019   Other: 11/12/2019     Scribe for Treatment Team: Trecia Rogers, LCSW 11/12/2019 10:21 AM

## 2019-11-12 NOTE — Progress Notes (Signed)
Recreation Therapy Notes  Date: 1.6.21 Time: 1000 Location: 500 Hall Dayroom  Group Topic: Coping Skills  Goal Area(s) Addresses:  Patient will identify positive coping skills. Patient will identify the benefit of positive coping skills. Patient will identify benefit of using coping skills post d/c.  Behavioral Response: Engaged  Intervention: Worksheet  Activity: Mind Map.  LRT and patients filled in the first 8 boxes (anger, depression, anxiety, social anxiety, cravings, stress, release and loneliness) with situations in which coping skills would be needed.  Patients worked on their own to identify coping skills for each of these areas.  LRT writes the coping skills on the board when the group comes back together.  Education: Pharmacologist, Building control surveyor.   Education Outcome: Acknowledges understanding/In group clarification offered/Needs additional education.   Clinical Observations/Feedback: Pt was active and appropriate during activity.  Pt was able to identify coping skills such as write-anger; listen to music-depression; counseling-anxiety; stay home, limit phone calls-social anxiety; snack-cravings; exercise- stress; cry- release; and make friends-loneliness.     [name], LRT/CTRS   [name], Markice Torbert A 11/12/2019 11:11 AM

## 2019-11-12 NOTE — Progress Notes (Signed)
   11/12/19 2100  Psych Admission Type (Psych Patients Only)  Admission Status Involuntary  Psychosocial Assessment  Patient Complaints Anxiety  Eye Contact Fair  Facial Expression Anxious;Pensive  Affect Anxious;Preoccupied  Speech Logical/coherent;Soft  Interaction Cautious;Childlike  Motor Activity Fidgety  Appearance/Hygiene Improved  Behavior Characteristics Anxious  Mood Anxious  Thought Process  Coherency Blocking  Content Paranoia  Delusions Paranoid  Perception Hallucinations  Hallucination Auditory  Judgment Poor  Confusion None  Danger to Self  Current suicidal ideation? Denies  Self-Injurious Behavior No self-injurious ideation or behavior indicators observed or expressed   Agreement Not to Harm Self Yes  Description of Agreement verbally contracts for safety  Danger to Others  Danger to Others None reported or observed

## 2019-11-12 NOTE — Progress Notes (Signed)
Recreation Therapy Notes  INPATIENT RECREATION THERAPY ASSESSMENT  Patient Details Name: [name] [name] [name] MRN: [id number] DOB: Mar 20, 1976 Today's Date: 11/12/2019       Information Obtained From: Patient  Able to Participate in Assessment/Interview: Yes  Patient Presentation: Alert  Reason for Admission (Per Patient): Other (Comments)(Pt stated stress)  Patient Stressors: Other (Comment)(Pt stated "just living")  Coping Skills:   Isolation, Write, TV, Exercise, Music, Prayer, Avoidance, Read, Hot Bath/Shower  Leisure Interests (2+):  Individual - TV, Individual - Other (Comment), Community - Other (Comment)(Cleaning; Shopping)  Frequency of Recreation/Participation: Other (Comment)(Weekly)  Awareness of Community Resources:   Yes  Community Resources:  Park  Current Use: No  If no, Barriers?: Other (Comment)(Weather)  Expressed Interest in [employer] Information: No  [employer] of Residence:  [place]  Patient Main Form of Transportation: Therapist, music  Patient Strengths:  Building control surveyor; Determination  Patient Identified Areas of Improvement:  Education officer, community; Mobility  Patient Goal for Hospitalization:  "get better so I can go home and take care of myself"  Current SI (including self-harm):  No  Current HI:  No  Current AVH: No  Staff Intervention Plan: Group Attendance, Collaborate with Interdisciplinary Treatment Team  Consent to Intern Participation: N/[name] [name]    [name] [name] [name], LRT/CTRS  [name], [name] [name] [name] 11/12/2019, 11:50 AM

## 2019-11-13 MED ORDER — BENZTROPINE MESYLATE 1 MG PO TABS: 1.0000 mg | ORAL_TABLET | Freq: Two times a day (BID) | ORAL | 3 refills | Status: DC

## 2019-11-13 MED ORDER — TRAZODONE HCL 150 MG PO TABS: 150.0000 mg | ORAL_TABLET | Freq: Every evening | ORAL | 1 refills | Status: DC | PRN

## 2019-11-13 MED ORDER — RISPERIDONE 4 MG PO TABS: 4.0000 mg | ORAL_TABLET | Freq: Two times a day (BID) | ORAL | 2 refills | Status: DC

## 2019-11-13 MED ORDER — PALIPERIDONE PALMITATE ER 156 MG/ML IM SUSY: 156.0000 mg | PREFILLED_SYRINGE | Freq: Once | INTRAMUSCULAR | 0 refills | Status: DC

## 2019-11-13 MED ORDER — PALIPERIDONE PALMITATE ER 156 MG/ML IM SUSY
156.0000 mg | PREFILLED_SYRINGE | Freq: Once | INTRAMUSCULAR | Status: AC
  Administered 2019-11-13: 156 mg via INTRAMUSCULAR
  Filled 2019-11-13: qty 1

## 2019-11-13 NOTE — Progress Notes (Signed)
  [facility] Adult Case Management Discharge Plan :  Will you be returning to the same living situation after discharge:  Yes,  home At discharge, do you have transportation home?: Yes,  [employer] Do you have the ability to pay for your medications: Yes,  medicare part a and b  Release of information consent forms completed and in the chart;  Patient's signature needed at discharge.  Patient to Follow up at: Follow-up Information    [facility]. Go on 11/14/2019.   Why: Your PSR team will be picking you up at 8:30am in the morning on 11/14/2019. If weather is bad, then they will do a home visit on Saturday, 11/15/2019 Contact information: [address], [place], [place] [address]  P: [phone] F: [phone]          Next level of care provider has access to [facility] Link:no  Safety Planning and Suicide Prevention discussed: No.; Pt declined SPE; with pt     Has patient been referred to the Quitline?: N/A patient is not a smoker  Patient has been referred for addiction treatment: Yes  Delphia Grates, LCSW 11/13/2019, 10:40 AM

## 2019-11-13 NOTE — [facility] Suicide Risk Assessment (Signed)
[facility] INPATIENT:  Family/Significant Other Suicide Prevention Education  Suicide Prevention Education:  Patient Refusal for Family/Significant Other Suicide Prevention Education: The patient [name] [name] has refused to provide written consent for family/significant other to be provided Family/Significant Other Suicide Prevention Education during admission and/or prior to discharge.  Physician notified.  Delphia Grates 11/13/2019, 10:12 AM

## 2019-11-13 NOTE — Plan of Care (Signed)
Pt participated in one recreation therapy group without prompting at completion of recreation therapy group sessions.   [name], LRT/CTRS

## 2019-11-13 NOTE — Progress Notes (Signed)
Recreation Therapy Notes  INPATIENT RECREATION TR PLAN  Patient Details Name: [name] [name] MRN: [id number] DOB: 02-Jun-1976 Today's Date: 11/13/2019  Rec Therapy Plan Is patient appropriate for Therapeutic Recreation?: Yes Treatment times per week: about 3 days Estimated Length of Stay: 5-7 days TR Treatment/Interventions: Group participation (Comment)  Discharge Criteria Pt will be discharged from therapy if:: Discharged Treatment plan/goals/alternatives discussed and agreed upon by:: Patient/family  Discharge Summary Short term goals set: See patient care plan Short term goals met: Adequate for discharge Progress toward goals comments: Groups attended Which groups?: Coping skills, Self-esteem Reason goals not met: None Therapeutic equipment acquired: N/A Reason patient discharged from therapy: Discharge from hospital Pt/family agrees with progress & goals achieved: Yes Date patient discharged from therapy: 11/13/19    Victorino Sparrow, LRT/CTRS  Ria Comment, Tagg Eustice A 11/13/2019, 11:13 AM

## 2019-11-13 NOTE — Progress Notes (Signed)
Recreation Therapy Notes  Date: 1.7.21 Time: 0945 Location: 500 Hall Dayroom  Group Topic: Self-Esteem  Goal Area(s) Addresses:  Patient will successfully identify positive attributes about themselves.  Patient will successfully identify benefit of improved self-esteem.   Behavioral Response: None  Intervention: Construction paper, scissors, magazines, glue sticks  Activity: Collage.  Patients created collages that highlighted the positive qualities they possess.  Patients used the magazines to find pictures, words or sayings that helped bring their vision together of how they see themselves.  Education:  Self-Esteem, Building control surveyor.   Education Outcome: Acknowledges education/In group clarification offered/Needs additional education  Clinical Observations/Feedback: Pt did not participate.  Pt observed and would talk to self but wasn't disruptive.  Pt flipped through a few magazines.     [name], LRT/CTRS         [name], Stephani Janak A 11/13/2019 11:08 AM

## 2019-11-13 NOTE — Progress Notes (Signed)
Patient ID: [name] [name], female   DOB: 06-May-1976, 44 y.o.   MRN: [id number] Patient denies SI, HI and AVH upon discharge.  Patient acknowledges understanding of all discharge instructions and receipt of personal belongings. Patient discharge to home/self care on her own accord.

## 2019-11-13 NOTE — BHH Suicide Risk Assessment (Signed)
[facility] Discharge Suicide Risk Assessment   Principal Problem: <principal problem not specified> Discharge Diagnoses: Active Problems:   Schizoaffective disorder, bipolar type (HCC)   Schizophrenia (HCC)   Total Time spent with patient: 45 minutes Musculoskeletal: Strength & Muscle Tone: within normal limits Gait & Station: normal Patient leans: N/A  Psychiatric Specialty Exam: Physical Exam  Review of Systems  Blood pressure 106/69, pulse 89, temperature 98.2 F (36.8 C), temperature source Oral, resp. rate 20, height 4\' 10"  (1.473 m), weight 48.1 kg, SpO2 99 %.Body mass index is 22.15 kg/m.  General Appearance: Casual  Eye Contact:  Good  Speech:  Clear and Coherent  Volume:  Decreased  Mood:  Euthymic  Affect:  Appropriate  Thought Process:  Goal Directed and Linear  Orientation:  Full (Time, Place, and Person)  Thought Content:  Delusions - fixed and baseline but nonproblematic at present  Suicidal Thoughts:  No  Homicidal Thoughts:  No  Memory:  Immediate;   Fair Recent;   Fair Remote generally intact as well  Judgement:  Fair  Insight:  Fair  Psychomotor Activity:  Normal  Concentration:  Concentration: Fair and Attention Span: Fair  Recall:  of Knowledge:  Fair  Language:  Fair  Akathisia:  Negative  Handed:  Right  AIMS (if indicated):     Assets:  Resilience Social Support  ADL's:  Intact  Cognition:  WNL  Sleep:  Number of Hours: 7.75     Mental Status Per Nursing Assessment::   On Admission:  NA  Demographic Factors:  Unemployed  Loss Factors: Decrease in vocational status  Historical Factors: NA  Risk Reduction Factors:   Sense of responsibility to family and Religious beliefs about death  Continued Clinical Symptoms:  Schizophrenia:   Paranoid or undifferentiated type  Cognitive Features That Contribute To Risk:  Loss of executive function    Suicide Risk:  Minimal: No identifiable suicidal ideation.  Patients presenting  with no risk factors but with morbid ruminations; may be classified as minimal risk based on the severity of the depressive symptoms    Plan Of Care/Follow-up recommendations:  Activity:  full  Trisha Ken, MD 11/13/2019, 8:05 AM

## 2019-11-13 NOTE — BHH Counselor (Signed)
Adult Comprehensive Assessment  Patient ID: [name] [name], female   DOB: 1976/08/31, 44 y.o.   MRN: [id number]    Information Source: Information source: Patient  Current Stressors: Patient states their primary concerns and needs for treatment are::Stress: Patient states their goals for this hospitilization and ongoing recovery are::"I want to get better:   Housing / Lack of housing: Patient reports that she lives in her own house but trying to continue getting more furniture for her place and obtain things from her last housing location.  Employment: On disability for mental health, but reports she wants a job and working towards getting back into school.  Financial: Has disability income and Medicare Family Relationships: Pt reports that she lacks family support.  Living/Environment/Situation: Living Arrangements: Ptreports she lives in her own housing in [place], [name] Living conditions (as described by patient or guardian):"It's nice" Who else lives in the home?:Alone How long has patient lived in current situation?:"2 months" What is atmosphere in current home: Temporary; comfortable  Family History: Current relationship?Single Are you sexually active?: No What is your sexual orientation?: straight Does patient have children?: Yes How many children?: 1 How is patient's relationship with their children?: 18YO daughter living with aunt in [place] daughter's father is in [place]. Pt reports that their relationship is "better now".  Childhood History: By whom was/is the patient raised?:Pt parents split up when pt was 85, father left, mother became involved with alcohol and pt ended up in foster care from age [age], but lived with her god parents, which was OK. Description of patient's relationship with caregiver when they were a child: good Patient's description of current relationship with people who raised him/her: Father in [place], some contact.mother  died 24 years ago Does patient have siblings?: Yes Number of Siblings: 3 Description of patient's current relationship with siblings: 1 sister deceased, i sister in [place], one in [place]. Little contact with sisters. Did patient suffer any verbal/emotional/physical/sexual abuse as a child?: No Did patient suffer from severe childhood neglect?: No Has patient ever been sexually abused/assaulted/raped as an adolescent or adult?: No Was the patient ever a victim of a crime or a disaster?: No Witnessed domestic violence?: No Has patient been effected by domestic violence as an adult?: No  Education: Highest grade of school patient has completed: bachelor's from [employer] Currently a student?: No(States her goal is to enroll at [employer]) Learning disability?: No  Employment/Work Situation: Employment situation: On disability Why is patient on disability: mental health How long has patient been on disability:17Years Patient's job has been impacted by current illness: No Did You Receive Any Psychiatric Treatment/Services While in the Military?: No Are There Guns or Other Weapons in [place]?: Noguns reported.  Financial Resources: Financial resources: [employer], Medicare Does patient have a Programmer, applications or guardian?: No  Alcohol/Substance Abuse: Alcohol/Substance Abuse Treatment RW:ERXVQMG: pt denies, drugs: pt denies Has alcohol/substance abuse ever caused legal problems?: No  Social Support System: Heritage manager System:None Describe Community Support System:Pt reports that she does not have any support systems. Type of faith/religion: N/A How does patient's faith help to cope with current illness?: N/A  Leisure/Recreation: Leisure and Hobbies:Doing hair, shopping and listening to music   Strengths/Needs: What is the patient's perception of their strengths?: Artistic, good at cleaning. Patient states they can use  these personal strengths during their treatment to contribute to their recovery:Expresses interest in being discharged. Patient states these barriers may affect/interfere with their treatment:N/A Patient states these barriers may  affect their return to the community:N/A Other important information patient would like considered in planning for their treatment:N/A  Discharge Plan: Currently receiving community mental health services: Yes No longer with [facility] for ACTT. She has [facility] for CST, therapy, and medication management. Patient states concerns and preferences for aftercare planning are:She is happy with [facility] and would like to continue. Patient states they will know when they are safe and ready for discharge when:Feels ready now. Does patient have access to transportation?: No Does patient have financial barriers related to discharge medications?: No, has Medicare. Plan for no access to transportation at discharge:[employer] Will patient be returning to same living situation after discharge?:yes; her own house  Summary/Recommendations:   Summary and Recommendations (to be completed by the evaluator): Patient is a 44 year old female who a client of [facility] respondent is non-compliant with current medication regimen. Respondent states that someone is trying to kill her and when she takes medication she always "gets worse." Pt's diagnosis is: Schizoaffective disorder, bipolar type (HCC). Recommendations for pt include: crisis stabilization, therapeutic milieu, medication management, attend and participate in group therapy, and developement of a comprehensive mental wellness plan.  Delphia Grates. 11/13/2019

## 2019-11-13 NOTE — BHH Counselor (Signed)
CSW contacted Shannell with [facility]. [facility] transferred the patient from CST to [facility] program due to medication incompliant. Patient has been having multiple behaviors at [facility]. She often refuses to take her medications and when she does then she will spit it out and get other peers to do the same. Shannell stated that an injection will be better for her. CSW informed Doristine Church that she obtained an invega injection today. Shannell asked for prescription and discharge summary to be faxed over.

## 2019-11-13 NOTE — Discharge Summary (Signed)
Physician Discharge Summary Note  Patient:  [name] [name] is an 44 y.o., female MRN:  [id number] DOB:  1975/11/12 Patient phone:  [phone] (home)  Patient address:   [address] [place] [address],  Total Time spent with patient: 45 minutes  Date of Admission:  11/10/2019 Date of Discharge: 11/13/2019  Reason for Admission:    Ms. Nery is a 44 year old treatment resistant schizophrenic patient who has had baseline level of fixed delusions, however she re- presented yesterday in a very disorganized state of mind.  She was literally talking nonstop but babbling nonsensically-required IM Ativan/Geodon and slept pretty well and woke up this morning a little more organized however she continues to have residual symptoms  Though she is alert and sluggish and oriented to person place situation, not exact date she continues to believe Jesus is "overwhelming her" and this is a fixed delusion that Jesus is somehow doing harm to her either physically/psychologically so forth. At the same time she is able to give coherent sentences now even though she is sluggish.  She denies wanting to harm self or others.  Her chart is extensive and the majority of symptoms tend to repeat themselves, recent visits were not elaborated here but are in the chart under chart review   Principal Problem: <principal problem not specified> Discharge Diagnoses: Active Problems:   Schizoaffective disorder, bipolar type (HCC)   Schizophrenia (HCC)   Past Psychiatric History: Extensive with numerous multiple and recent admissions  Past Medical History:  Past Medical History:  Diagnosis Date  . Arthritis   . Depression    History reviewed. No pertinent surgical history. Family History: History reviewed. No pertinent family history. Family Psychiatric  History: No new data Social History:  Social History   Substance and Sexual Activity  Alcohol Use Not Currently   Comment: BAC not available     Social  History   Substance and Sexual Activity  Drug Use Not Currently   Comment: UDS not available    Social History   Socioeconomic History  . Marital status: Single    Spouse name: Not on file  . Number of children: Not on file  . Years of education: Not on file  . Highest education level: Not on file  Occupational History  . Occupation: Unknown  Tobacco Use  . Smoking status: Never Smoker  . Smokeless tobacco: Never Used  Substance and Sexual Activity  . Alcohol use: Not Currently    Comment: BAC not available  . Drug use: Not Currently    Comment: UDS not available  . Sexual activity: Not Currently  Other Topics Concern  . Not on file  Social History Narrative   Pt stated that she lives in [place], and that she lives alone.     Social Determinants of Health   Financial Resource Strain:   . Difficulty of Paying Living Expenses: Not on file  Food Insecurity:   . Worried About Programme researcher, broadcasting/film/video in the Last Year: Not on file  . Ran Out of Food in the Last Year: Not on file  Transportation Needs:   . Lack of Transportation (Medical): Not on file  . Lack of Transportation (Non-Medical): Not on file  Physical Activity:   . Days of Exercise per Week: Not on file  . Minutes of Exercise per Session: Not on file  Stress:   . Feeling of Stress : Not on file  Social Connections:   . Frequency of Communication with Friends and  Family: Not on file  . Frequency of Social Gatherings with Friends and Family: Not on file  . Attends Religious Services: Not on file  . Active Member of Clubs or Organizations: Not on file  . Attends Archivist Meetings: Not on file  . Marital Status: Not on file    Hospital Course:    As discussed Ms. Robins presented in a very disorganized state literally babbling nonsense nonstop, however she was given IM lorazepam and Geodon and slept for about 12 hours woke up in a more coherent state, her Risperdal was resumed.  Though she stated  she did not think the Risperdal was the best medicine for her when she was more coherent, she had responded to it in the past and we felt it was worth staying with this agent because she had done so well so her usual dose was resumed.  By the date of the seventh she was alert oriented cooperative knew everything but the exact date but knew the day of the week and so forth she denied auditory or visual hallucinations her speech was coherent and goal-directed thought was clear she did still have some delusions involving "Jesus" but would not elaborate them but was not propelled by them in any manner.  No EPS or TD  Because of the noncompliance leading to the acute decline we did administer long-acting injectable in the past she had been on low-dose to paliperidone probably because of her small stature so we resumed the lowest amount of monthly injection of paliperidone in addition to her oral meds  Physical Findings: AIMS: Facial and Oral Movements Muscles of Facial Expression: None, normal Lips and Perioral Area: None, normal Jaw: None, normal Tongue: None, normal,Extremity Movements Upper (arms, wrists, hands, fingers): None, normal Lower (legs, knees, ankles, toes): None, normal, Trunk Movements Neck, shoulders, hips: None, normal, Overall Severity Severity of abnormal movements (highest score from questions above): None, normal Incapacitation due to abnormal movements: None, normal Patient's awareness of abnormal movements (rate only patient's report): No Awareness, Dental Status Current problems with teeth and/or dentures?: No Does patient usually wear dentures?: No  CIWA:  CIWA-Ar Total: 0 COWS:  COWS Total Score: 1  Musculoskeletal: Strength & Muscle Tone: within normal limits Gait & Station: normal Patient leans: N/A  Psychiatric Specialty Exam: Physical Exam  Review of Systems  Blood pressure 106/69, pulse 89, temperature 98.2 F (36.8 C), temperature source Oral, resp. rate  20, height 4\' 10"  (1.473 m), weight 48.1 kg, SpO2 99 %.Body mass index is 22.15 kg/m.  General Appearance: Casual  Eye Contact:  Good  Speech:  Clear and Coherent  Volume:  Decreased  Mood:  Euthymic  Affect:  Appropriate  Thought Process:  Goal Directed and Linear  Orientation:  Full (Time, Place, and Person)  Thought Content:  Delusions - fixed and baseline but nonproblematic at present  Suicidal Thoughts:  No  Homicidal Thoughts:  No  Memory:  Immediate;   Fair Recent;   Fair Remote generally intact as well  Judgement:  Fair  Insight:  Fair  Psychomotor Activity:  Normal  Concentration:  Concentration: Fair and Attention Span: Fair  Recall:  [employer] of Knowledge:  Fair  Language:  Fair  Akathisia:  Negative  Handed:  Right  AIMS (if indicated):     Assets:  Resilience Social Support  ADL's:  Intact  Cognition:  WNL  Sleep:  Number of Hours: 7.75        Has this patient  used any form of tobacco in the last 30 days? (Cigarettes, Smokeless Tobacco, Cigars, and/or Pipes) Yes, No  Blood Alcohol level:  Lab Results  Component Value Date   ETH <10 11/06/2019   ETH <10 10/15/2019    Metabolic Disorder Labs:  Lab Results  Component Value Date   HGBA1C 4.8 08/06/2019   MPG 91.06 08/06/2019   MPG 88.19 06/26/2018   Lab Results  Component Value Date   PROLACTIN 28.9 (H) 06/26/2018   Lab Results  Component Value Date   CHOL 160 09/19/2019   TRIG 52 09/19/2019   HDL 46 09/19/2019   CHOLHDL 3.5 09/19/2019   VLDL 10 09/19/2019   LDLCALC 104 (H) 09/19/2019   LDLCALC 90 08/06/2019    See Psychiatric Specialty Exam and Suicide Risk Assessment completed by Attending Physician prior to discharge.  Discharge destination:  Home  Is patient on multiple antipsychotic therapies at discharge:  No   Has Patient had three or more failed trials of antipsychotic monotherapy by history:  No  Recommended Plan for Multiple Antipsychotic Therapies: NA   Allergies as of  11/13/2019   No Known Allergies     Medication List    STOP taking these medications   azithromycin 250 MG tablet Commonly known as: ZITHROMAX   iloperidone 4 MG Tabs tablet Commonly known as: FANAPT     TAKE these medications     Indication  acetaminophen 325 MG tablet Commonly known as: TYLENOL Take 650 mg by mouth every 6 (six) hours as needed (For menstrual cycle.).  Indication: Pain   benztropine 1 MG tablet Commonly known as: COGENTIN Take 1 tablet (1 mg total) by mouth 2 (two) times daily.  Indication: Extrapyramidal Reaction caused by Medications   paliperidone 156 MG/ML Susy injection Commonly known as: INVEGA SUSTENNA Inject 1 mL (156 mg total) into the muscle once for 1 dose. DUE 12/13/2019  Indication: Schizoaffective Disorder   risperidone 4 MG tablet Commonly known as: RISPERDAL Take 1 tablet (4 mg total) by mouth 2 (two) times daily.  Indication: Hypomanic Episode of Bipolar Disorder   sertraline 50 MG tablet Commonly known as: ZOLOFT Take 1 tablet (50 mg total) by mouth daily.  Indication: Major Depressive Disorder   traZODone 150 MG tablet Commonly known as: DESYREL Take 1 tablet (150 mg total) by mouth at bedtime as needed and may repeat dose one time if needed for sleep.  Indication: Trouble Sleeping      SignedMalvin Johns, MD 11/13/2019, 8:01 AM

## 2019-11-13 NOTE — Progress Notes (Signed)
Patient ID: [name] [name], female   DOB: [birth year], 44 y.o.   MRN: [id number]   CSW scheduled [employer] for the patient for 11:30am. Pt's nurse was notified.

## 2019-11-30 ENCOUNTER — Emergency Department (HOSPITAL_COMMUNITY)
Admission: EM | Admit: 2019-11-30 | Discharge: 2019-11-30 | Disposition: A | Discharge status: Other Acute Inpatient Hospital | Payer: Medicare Other | Attending: Emergency Medicine | Admitting: Emergency Medicine

## 2019-11-30 ENCOUNTER — Encounter (HOSPITAL_COMMUNITY): Payer: Self-pay | Admitting: Behavioral Health

## 2019-11-30 ENCOUNTER — Other Ambulatory Visit: Payer: Self-pay

## 2019-11-30 DIAGNOSIS — R45851 Suicidal ideations: Secondary | ICD-10-CM | POA: Diagnosis not present

## 2019-11-30 DIAGNOSIS — F259 Schizoaffective disorder, unspecified: Secondary | ICD-10-CM | POA: Insufficient documentation

## 2019-11-30 DIAGNOSIS — T50904A Poisoning by unspecified drugs, medicaments and biological substances, undetermined, initial encounter: Secondary | ICD-10-CM | POA: Diagnosis present

## 2019-11-30 DIAGNOSIS — Z20822 Contact with and (suspected) exposure to covid-19: Secondary | ICD-10-CM | POA: Insufficient documentation

## 2019-11-30 DIAGNOSIS — M1651 Unilateral post-traumatic osteoarthritis, right hip: Secondary | ICD-10-CM | POA: Diagnosis not present

## 2019-11-30 DIAGNOSIS — F29 Unspecified psychosis not due to a substance or known physiological condition: Secondary | ICD-10-CM | POA: Diagnosis not present

## 2019-11-30 DIAGNOSIS — R440 Auditory hallucinations: Secondary | ICD-10-CM | POA: Diagnosis not present

## 2019-11-30 DIAGNOSIS — F200 Paranoid schizophrenia: Secondary | ICD-10-CM | POA: Diagnosis not present

## 2019-11-30 DIAGNOSIS — R9431 Abnormal electrocardiogram [ECG] [EKG]: Secondary | ICD-10-CM | POA: Diagnosis not present

## 2019-11-30 DIAGNOSIS — Z79899 Other long term (current) drug therapy: Secondary | ICD-10-CM | POA: Insufficient documentation

## 2019-11-30 DIAGNOSIS — T40421A Poisoning by tramadol, accidental (unintentional), initial encounter: Secondary | ICD-10-CM | POA: Diagnosis not present

## 2019-11-30 DIAGNOSIS — R442 Other hallucinations: Secondary | ICD-10-CM | POA: Diagnosis not present

## 2019-11-30 DIAGNOSIS — Z03818 Encounter for observation for suspected exposure to other biological agents ruled out: Secondary | ICD-10-CM | POA: Diagnosis not present

## 2019-11-30 DIAGNOSIS — Z915 Personal history of self-harm: Secondary | ICD-10-CM | POA: Diagnosis not present

## 2019-11-30 DIAGNOSIS — I959 Hypotension, unspecified: Secondary | ICD-10-CM | POA: Diagnosis not present

## 2019-11-30 DIAGNOSIS — Z91410 Personal history of adult physical and sexual abuse: Secondary | ICD-10-CM | POA: Diagnosis not present

## 2019-11-30 LAB — RAPID URINE DRUG SCREEN, HOSP PERFORMED
Amphetamines: NOT DETECTED
Barbiturates: NOT DETECTED
Benzodiazepines: NOT DETECTED
Cocaine: NOT DETECTED
Opiates: NOT DETECTED
Tetrahydrocannabinol: NOT DETECTED

## 2019-11-30 LAB — CBC WITH DIFFERENTIAL/PLATELET
Abs Immature Granulocytes: 0.01 10*3/uL (ref 0.00–0.07)
Basophils Absolute: 0 10*3/uL (ref 0.0–0.1)
Basophils Relative: 0 %
Eosinophils Absolute: 0 10*3/uL (ref 0.0–0.5)
Eosinophils Relative: 0 %
HCT: 33.9 % — ABNORMAL LOW (ref 36.0–46.0)
Hemoglobin: 10.9 g/dL — ABNORMAL LOW (ref 12.0–15.0)
Immature Granulocytes: 0 %
Lymphocytes Relative: 24 %
Lymphs Abs: 1.1 10*3/uL (ref 0.7–4.0)
MCH: 28.8 pg (ref 26.0–34.0)
MCHC: 32.2 g/dL (ref 30.0–36.0)
MCV: 89.7 fL (ref 80.0–100.0)
Monocytes Absolute: 0.4 10*3/uL (ref 0.1–1.0)
Monocytes Relative: 8 %
Neutro Abs: 3.1 10*3/uL (ref 1.7–7.7)
Neutrophils Relative %: 68 %
Platelets: 303 10*3/uL (ref 150–400)
RBC: 3.78 MIL/uL — ABNORMAL LOW (ref 3.87–5.11)
RDW: 14.3 % (ref 11.5–15.5)
WBC: 4.5 10*3/uL (ref 4.0–10.5)
nRBC: 0 % (ref 0.0–0.2)

## 2019-11-30 LAB — COMPREHENSIVE METABOLIC PANEL
ALT: 10 U/L (ref 0–44)
AST: 15 U/L (ref 15–41)
Albumin: 4.2 g/dL (ref 3.5–5.0)
Alkaline Phosphatase: 42 U/L (ref 38–126)
Anion gap: 10 (ref 5–15)
BUN: 14 mg/dL (ref 6–20)
CO2: 26 mmol/L (ref 22–32)
Calcium: 9.3 mg/dL (ref 8.9–10.3)
Chloride: 102 mmol/L (ref 98–111)
Creatinine, Ser: 0.59 mg/dL (ref 0.44–1.00)
GFR calc Af Amer: >60 mL/min (ref >60)
GFR calc non Af Amer: >60 mL/min (ref >60)
Glucose, Bld: 77 mg/dL (ref 70–99)
Potassium: 3.5 mmol/L (ref 3.5–5.1)
Sodium: 138 mmol/L (ref 135–145)
Total Bilirubin: 0.6 mg/dL (ref 0.3–1.2)
Total Protein: 7.2 g/dL (ref 6.5–8.1)

## 2019-11-30 LAB — ACETAMINOPHEN LEVEL: Acetaminophen (Tylenol), Serum: <10 ug/mL — ABNORMAL LOW (ref 10–30)

## 2019-11-30 LAB — SALICYLATE LEVEL: Salicylate Lvl: <7 mg/dL — ABNORMAL LOW (ref 7.0–30.0)

## 2019-11-30 LAB — RESPIRATORY PANEL BY RT PCR (FLU A&B, COVID)
Influenza A by PCR: NEGATIVE
Influenza B by PCR: NEGATIVE
SARS Coronavirus 2 by RT PCR: NEGATIVE

## 2019-11-30 LAB — ETHANOL: Alcohol, Ethyl (B): <10 mg/dL (ref <10)

## 2019-11-30 LAB — I-STAT BETA HCG BLOOD, ED (MC, WL, AP ONLY): I-stat hCG, quantitative: <5 m[IU]/mL (ref <5)

## 2019-11-30 MED ORDER — BENZTROPINE MESYLATE 1 MG PO TABS
1.0000 mg | ORAL_TABLET | Freq: Two times a day (BID) | ORAL | Status: DC
  Filled 2019-11-30: qty 1

## 2019-11-30 MED ORDER — SERTRALINE HCL 50 MG PO TABS
50.0000 mg | ORAL_TABLET | Freq: Every day | ORAL | Status: DC
  Filled 2019-11-30: qty 1

## 2019-11-30 MED ORDER — RISPERIDONE 2 MG PO TABS
4.0000 mg | ORAL_TABLET | Freq: Two times a day (BID) | ORAL | Status: DC
  Filled 2019-11-30: qty 2

## 2019-11-30 MED ORDER — TRAZODONE HCL 50 MG PO TABS: 150.0000 mg | ORAL_TABLET | Freq: Every evening | ORAL | Status: DC | PRN

## 2019-11-30 NOTE — ED Notes (Signed)
Pt requested female aide to perform EKG, will continue to monitor.

## 2019-11-30 NOTE — ED Notes (Signed)
11/30/2019  1624 Spoke with Candi at [facility] and gave her report. Patient going to room 107 per Candi.

## 2019-11-30 NOTE — ED Notes (Signed)
Pt removed vital signs devices. Will attempt to collect a set.

## 2019-11-30 NOTE — Progress Notes (Signed)
Patient meets criteria for inpatient treatment per Hillery Jacks, NP. No appropriate beds at [facility] currently. CSW faxed referrals to the following facilities for review:  [facility]   [facility]   [facility]   [facility]   [facility]   [facility]   [facility]   [facility]   [facility]   [facility]     TTS will continue to seek bed placement.     Ruthann Cancer MSW, [facility] Clincal Social Worker Disposition  [facility] Ph: [phone] Fax: [phone] 11/30/2019 1:35 PM

## 2019-11-30 NOTE — ED Notes (Signed)
Pt belongings placed in bags, pt advised to change into burgundy scrubs.

## 2019-11-30 NOTE — Progress Notes (Signed)
PT is under review at [facility]. CSW faxed additional documents to facility.  Ruthann Cancer MSW, LCSWA Clincal Social Worker Disposition  [facility] Ph: [phone] Fax: [phone]

## 2019-11-30 NOTE — ED Notes (Signed)
[phone] [employer] (Therapist) [phone] (Therapist)

## 2019-11-30 NOTE — ED Provider Notes (Signed)
[facility]-EMERGENCY DEPT Provider Note   CSN: [id number] Arrival date & time: 11/30/19  2536     History Chief Complaint  Patient presents with  . Suicidal  . Drug Overdose    [name] [name] is a 44 y.o. female.  Patient arrives by EMS after she called them saying that she took more medicine than prescribed.  She said that Jesus told her to take an extra dose of her medicine.  Zoloft trazodone risperidone.  She denies any other ingestions.  Denies any alcohol or drug use.  She said she feels a little dizzy but otherwise unremarkable.  The history is provided by the patient and the EMS personnel.  Drug Overdose This is a new problem. The current episode started less than 1 hour ago. The problem has not changed since onset.Pertinent negatives include no chest pain, no abdominal pain, no headaches and no shortness of breath. Nothing aggravates the symptoms. Nothing relieves the symptoms. She has tried nothing for the symptoms. The treatment provided no relief.       Past Medical History:  Diagnosis Date  . Arthritis   . Depression     Patient Active Problem List   Diagnosis Date Noted  . Homicidal ideations 11/07/2019  . Schizoaffective disorder (HCC) 10/16/2019  . Schizophrenia (HCC) 09/19/2019  . Depression 09/01/2019  . Suicidal ideation   . Homelessness 07/06/2019  . Adjustment disorder with disturbance of emotion 07/06/2019  . Paranoid schizophrenia (HCC) 07/01/2019  . Hallucinations   . Schizoaffective disorder, bipolar type (HCC) 09/01/2018    No past surgical history on file.   OB History   No obstetric history on file.     No family history on file.  Social History   Tobacco Use  . Smoking status: Never Smoker  . Smokeless tobacco: Never Used  Substance Use Topics  . Alcohol use: Not Currently    Comment: BAC not available  . Drug use: Not Currently    Comment: UDS not available    Home Medications Prior to Admission  medications   Medication Sig Start Date End Date Taking? Authorizing Provider  acetaminophen (TYLENOL) 325 MG tablet Take 650 mg by mouth every 6 (six) hours as needed (For menstrual cycle.).    [provider]  benztropine (COGENTIN) 1 MG tablet Take 1 tablet (1 mg total) by mouth 2 (two) times daily. 11/13/19   Malvin Johns, MD  paliperidone (INVEGA SUSTENNA) 156 MG/ML SUSY injection Inject 1 mL (156 mg total) into the muscle once for 1 dose. DUE 12/13/2019 11/13/19 11/13/19  Malvin Johns, MD  risperidone (RISPERDAL) 4 MG tablet Take 1 tablet (4 mg total) by mouth 2 (two) times daily. 11/13/19   Malvin Johns, MD  sertraline (ZOLOFT) 50 MG tablet Take 1 tablet (50 mg total) by mouth daily. 09/22/19   Rankin, Shuvon B, NP  traZODone (DESYREL) 150 MG tablet Take 1 tablet (150 mg total) by mouth at bedtime as needed and may repeat dose one time if needed for sleep. 11/13/19   Malvin Johns, MD    Allergies    Patient has no known allergies.  Review of Systems   Review of Systems  Constitutional: Negative for fever.  HENT: Negative for sore throat.   Respiratory: Negative for shortness of breath.   Cardiovascular: Negative for chest pain.  Gastrointestinal: Negative for abdominal pain.  Genitourinary: Negative for dysuria.  Musculoskeletal: Negative for back pain.  Skin: Negative for rash.  Neurological: Positive for dizziness. Negative for headaches.  Psychiatric/Behavioral: Positive for hallucinations.    Physical Exam Updated Vital Signs BP 100/64 (BP Location: Right Arm)   Pulse 72   Resp 15   SpO2 100%   Physical Exam Vitals and nursing note reviewed.  Constitutional:      General: She is not in acute distress.    Appearance: She is well-developed.  HENT:     Head: Normocephalic and atraumatic.  Eyes:     Conjunctiva/sclera: Conjunctivae normal.  Cardiovascular:     Rate and Rhythm: Normal rate and regular rhythm.     Heart sounds: No murmur.  Pulmonary:     Effort:  Pulmonary effort is normal. No respiratory distress.     Breath sounds: Normal breath sounds.  Abdominal:     Palpations: Abdomen is soft.     Tenderness: There is no abdominal tenderness.  Musculoskeletal:        General: Normal range of motion.     Cervical back: Neck supple.     Right lower leg: No edema.     Left lower leg: No edema.  Skin:    General: Skin is warm and dry.     Capillary Refill: Capillary refill takes less than 2 seconds.  Neurological:     General: No focal deficit present.     Mental Status: She is alert.     Comments: Patient is awake and alert.  She is speaking softly but coherently.  Psychiatric:        Thought Content: Thought content is delusional.     ED Results / Procedures / Treatments   Labs (all labs ordered are listed, but only abnormal results are displayed) Labs Reviewed  CBC WITH DIFFERENTIAL/PLATELET - Abnormal; Notable for the following components:      Result Value   RBC 3.78 (*)    Hemoglobin 10.9 (*)    HCT 33.9 (*)    All other components within normal limits  ACETAMINOPHEN LEVEL - Abnormal; Notable for the following components:   Acetaminophen (Tylenol), Serum <10 (*)    All other components within normal limits  SALICYLATE LEVEL - Abnormal; Notable for the following components:   Salicylate Lvl <9.6 (*)    All other components within normal limits  RESPIRATORY PANEL BY RT PCR (FLU A&B, COVID)  COMPREHENSIVE METABOLIC PANEL  ETHANOL  RAPID URINE DRUG SCREEN, HOSP PERFORMED  I-STAT BETA HCG BLOOD, ED ([facility], [facility], [facility] ONLY)    EKG EKG Interpretation  Date/Time:  Sunday November 30 2019 08:52:39 EST Ventricular Rate:  60 PR Interval:    QRS Duration: 89 QT Interval:  425 QTC Calculation: 425 R Axis:   87 Text Interpretation: Sinus rhythm Nonspecific T abnrm, anterolateral leads normal intervals No significant change since 11/20 Confirmed by Aletta Edouard [id number]) on 11/30/2019 9:59:01 AM   Radiology No results found.   Procedures Procedures (including critical care time)  Medications Ordered in ED Medications  risperiDONE (RISPERDAL) tablet 4 mg (4 mg Oral Refused 11/30/19 1137)  benztropine (COGENTIN) tablet 1 mg (1 mg Oral Refused 11/30/19 1138)  sertraline (ZOLOFT) tablet 50 mg (50 mg Oral Refused 11/30/19 1138)  traZODone (DESYREL) tablet 150 mg (has no administration in time range)    ED Course  I have reviewed the triage vital signs and the nursing notes.  Pertinent labs & imaging results that were available during my care of the patient were reviewed by me and considered in my medical decision making (see chart for details).  Clinical Course as of Nov 29 8248  Sun Nov 30, 2019  4865 44 year old female history of mental health issues here after an intentional overdose of her regular medication.  She said she took 2 pills instead of 1.  Differential includes overdose, arrhythmia, metabolic disturbance.  Checking labs EKG.   [MB]  0835 Blood pressure 100/64 but on review of prior values she is usually no higher than 110.   [MB]  [phone] Patient is anemic with a hemoglobin of 10.9 but has been in that range prior.   [MB]  1347 Patient is voluntary and not on an IVC.  She has been screened by behavioral health and they are recommending inpatient treatment.  Bed search is in progress.  I have ordered her home medications.   [MB]    Clinical Course User Index [MB] Terrilee Files, MD   MDM Rules/Calculators/A&P                       Final Clinical Impression(s) / ED Diagnoses Final diagnoses:  Drug overdose, undetermined intent, initial encounter  Auditory hallucinations    Rx / DC Orders ED Discharge Orders    None       Terrilee Files, MD 11/30/19 1704

## 2019-11-30 NOTE — Progress Notes (Signed)
11/30/2019  1557  Called [phone] to give report. Spoke with Candy at [facility], per their policy she can not take report until transport is here to transport patient. Will call back.

## 2019-11-30 NOTE — Progress Notes (Signed)
Pt accepted to [facility] Dr. Flossie Dibble is the accepting provider.  Call report to [phone] [facility], RN @ [facility] ED notified.   Pt is Voluntary  Pt may be transported by [employer] Pt scheduled to arrive as soon as transportation is set up.   Ruthann Cancer MSW, [facility] Social Worker Disposition  [facility] Ph: [phone] Fax: [phone]  11/30/2019 3:45 PM

## 2019-11-30 NOTE — ED Notes (Signed)
Pt belongings labeled and placed into belonging storage at nursing station, pink purse, 2 pt belonging bags w/ clothing.

## 2019-11-30 NOTE — Progress Notes (Signed)
11/30/2019  1601  Called [facility] [phone] to transport patient to [facility]. Per transport they will be here in 10 minutes.

## 2019-11-30 NOTE — ED Notes (Signed)
Pt walk assisted to restroom for urine sample.

## 2019-11-30 NOTE — ED Triage Notes (Signed)
Pt BIBA from home.   Per EMS-  Pt called EMS reporting taking "more medication than prescribed"  "Jesus told me to take them to kill myself" (2 Zoloft, 2 trazadone, 2 respiderone).  Pt denies any other ingestion.   Pt arrives AOx4, ambulatory.  Pt c/o "feeling unwell, nothing specifically wrong."  Denies n/v/d.  Denies dizziness, blurred vision, denies pain.

## 2019-11-30 NOTE — ED Provider Notes (Signed)
[facility] accepted to [facility] for [facility] care by Dr. Flossie Dibble. Stable at time of transfer.    Virgina Norfolk, DO 11/30/19 1614

## 2019-11-30 NOTE — BH Assessment (Signed)
Tele Assessment Note   Patient Name: [name] [name] MRN: [id number] Referring Physician: Aletta Edouard, MD Location of Patient: [facility] Location of Provider: [place] Department  [name] [name] is a 44 y.o. female who presented to [facility] on voluntary basis with complaint of intentional overdose and apparent delusion.  Pt lives alone in [place], and she receives outpatient psychiatric services through [facility].  Pt has been assessed by TTS numerous times in the past, and she was most recently treated inpatient at [facility] in early January 2021.  Pt reported as follows:  Pt stated that [name] [name] has been bothering her for about two decades, and that last night [name] [name] told her to intentionally overdose on medication so she would kill herself.  Per notes, Pt endorsed taking 2 Zoloft, 2 trazadone, 2 risperidone.  Pt denied being suicidal -- ''I was doing what [name] told me.''  ''I just want [name] to leave me alone.''  Pt endorsed some sadness and irritability.  ''God is trying to kill me.''  Pt denied self-injurious behavior, hallucination, homicidal ideation, and substance use.  She endorsed hearing the voice of God, and she had no insight into the hallucinatory nature of this experience.  During assessment, Pt presented as alert and oriented.  She had good eye contact and was cooperative.  Pt was gowned, and she appeared appropriately groomed.  Pt's mood and affect were preoccupied -- ''I need God to leave me alone.  I don't have a psychiatric problem.''  Pt's speech was normal in rate, rhythm, and volume.  Thought processes were within normal range, and thought content was tangential.  Thought content suggested persecutory delusion.  Pt's memory and concentration were intact.  Insight, judgment, and impulse control were poor.  Per T. Bobby Rumpf, NP, Pt meets inpatient criteria  Diagnosis: Schizoaffective Disorder  Past Medical History:  Past Medical History:  Diagnosis Date  .  Arthritis   . Depression     No past surgical history on file.  Family History: No family history on file.  Social History:  reports that she has never smoked. She has never used smokeless tobacco. She reports previous alcohol use. She reports previous drug use.  Additional Social History:  Alcohol / Drug Use Pain Medications: See MAR Prescriptions: See MAR Over the Counter: See MAR History of alcohol / drug use?: No history of alcohol / drug abuse  CIWA: CIWA-Ar BP: 106/62 Pulse Rate: 69 COWS:    Allergies: No Known Allergies  Home Medications: (Not in a hospital admission)   OB/GYN Status:  No LMP recorded.  General Assessment Data Assessment unable to be completed: Yes Reason for not completing assessment: Cart not available(Staff advised they would set it up when ready) Location of Assessment: [facility] ED TTS Assessment: In system Is this a Tele or Face-to-Face Assessment?: Tele Assessment Is this an Initial Assessment or a Re-assessment for this encounter?: Initial Assessment Language Other than English: No Living Arrangements: Other (Comment)(Lives by self) Marital status: Single Maiden name: Gillean Pregnancy Status: No Living Arrangements: Alone Can pt return to current living arrangement?: Yes Admission Status: Voluntary Is patient capable of signing voluntary admission?: Yes Referral Source: Self/Family/Friend Insurance type: [facility] MCR     Crisis Care Plan Living Arrangements: Alone Name of Psychiatrist: [place] Name of Therapist: [place]  Education Status Is patient currently in school?: No Is the patient employed, unemployed or receiving disability?: Receiving disability income  Risk to self with the past 6 months Suicidal Ideation: No(Pt denied --  see notes) Has patient been a risk to self within the past 6 months prior to admission? : Yes Suicidal Intent: No(Pt denied, but see notes) Has patient had any suicidal intent within the past  6 months prior to admission? : Yes Is patient at risk for suicide?: Yes Suicidal Plan?: (See notes) Has patient had any suicidal plan within the past 6 months prior to admission? : No What has been your use of drugs/alcohol within the last 12 months?: Denied Previous Attempts/Gestures: Yes How many times?: 3 Other Self Harm Risks: May not be compliant with medication Triggers for Past Attempts: Other (Comment)(Delusion) Intentional Self Injurious Behavior: None Family Suicide History: Unknown Recent stressful life event(s): Other (Comment)(Continued delusion) Persecutory voices/beliefs?: No Depression: Yes Depression Symptoms: Insomnia, Feeling angry/irritable Substance abuse history and/or treatment for substance abuse?: No Suicide prevention information given to non-admitted patients: Not applicable  Risk to Others within the past 6 months Homicidal Ideation: No Does patient have any lifetime risk of violence toward others beyond the six months prior to admission? : No Thoughts of Harm to Others: No Current Homicidal Intent: No Current Homicidal Plan: No Access to Homicidal Means: No Assessment of Violence: None Noted Does patient have access to weapons?: No Criminal Charges Pending?: No Does patient have a court date: No Is patient on probation?: No  Psychosis Hallucinations: None noted Delusions: Persecutory(See notes)  Mental Status Report Appearance/Hygiene: Unremarkable, In scrubs Eye Contact: Good Motor Activity: Freedom of movement, Unremarkable Level of Consciousness: Alert Mood: Preoccupied Affect: Preoccupied Anxiety Level: None Thought Processes: Tangential Judgement: Impaired Orientation: Person, Place, Time, Situation Obsessive Compulsive Thoughts/Behaviors: None  Cognitive Functioning Concentration: Fair Memory: Remote Intact, Recent Intact Is patient IDD: No Insight: Poor Impulse Control: Poor Appetite: Good Have you had any weight changes? : No  Change Sleep: Decreased Total Hours of Sleep: (Not sure) Vegetative Symptoms: None  ADLScreening [facility] Assessment Services) Patient's cognitive ability adequate to safely complete daily activities?: Yes Patient able to express need for assistance with ADLs?: Yes Independently performs ADLs?: Yes (appropriate for developmental age)  Prior Inpatient Therapy Prior Inpatient Therapy: Yes Prior Therapy Dates: 2020, 2019, 2018 Prior Therapy Facilty/Provider(s): [facility], [facility], [facility] Reason for Treatment: MH issues  Prior Outpatient Therapy Prior Outpatient Therapy: Yes Prior Therapy Dates: Ongoing Prior Therapy Facilty/Provider(s): [facility] Reason for Treatment: med mang Does patient have an ACCT team?: No Does patient have Intensive In-House Services?  : No Does patient have [facility] services? : No Does patient have P4CC services?: No  ADL Screening (condition at time of admission) Patient's cognitive ability adequate to safely complete daily activities?: Yes Is the patient deaf or have difficulty hearing?: No Does the patient have difficulty seeing, even when wearing glasses/contacts?: No Does the patient have difficulty concentrating, remembering, or making decisions?: No Patient able to express need for assistance with ADLs?: Yes Does the patient have difficulty dressing or bathing?: No Independently performs ADLs?: Yes (appropriate for developmental age) Does the patient have difficulty walking or climbing stairs?: No Weakness of Legs: None Weakness of Arms/Hands: None  Home Assistive Devices/Equipment Home Assistive Devices/Equipment: None  Therapy Consults (therapy consults require a physician order) PT Evaluation Needed: No OT Evalulation Needed: No SLP Evaluation Needed: No Abuse/Neglect Assessment (Assessment to be complete while patient is alone) Abuse/Neglect Assessment Can Be Completed: Yes Physical Abuse: Denies Verbal Abuse: Denies Sexual Abuse:  Denies Exploitation of patient/patient's resources: Denies Self-Neglect: Denies Values / Beliefs Cultural Requests During Hospitalization: None Spiritual Requests During Hospitalization: None Consults  Spiritual Care Consult Needed: No Transition of Care Team Consult Needed: No Advance Directives (For Healthcare) Does Patient Have a Medical Advance Directive?: No          Disposition:  Disposition Initial Assessment Completed for this Encounter: Yes Disposition of Patient: Admit Type of inpatient treatment program: Adult(Per T. Melvyn Neth, NP, Pt meets inpt criteria)  This service was provided via telemedicine using a 2-way, interactive audio and video technology.  Names of all persons participating in this telemedicine service and their role in this encounter. Name: [name] [name] Role: Patient             Earline Mayotte 11/30/2019 1:12 PM

## 2019-12-01 DIAGNOSIS — F200 Paranoid schizophrenia: Secondary | ICD-10-CM | POA: Diagnosis not present

## 2019-12-02 DIAGNOSIS — F200 Paranoid schizophrenia: Secondary | ICD-10-CM | POA: Diagnosis not present

## 2019-12-03 DIAGNOSIS — F200 Paranoid schizophrenia: Secondary | ICD-10-CM | POA: Diagnosis not present

## 2019-12-04 DIAGNOSIS — F200 Paranoid schizophrenia: Secondary | ICD-10-CM | POA: Diagnosis not present

## 2019-12-05 DIAGNOSIS — F200 Paranoid schizophrenia: Secondary | ICD-10-CM | POA: Diagnosis not present

## 2019-12-06 DIAGNOSIS — F200 Paranoid schizophrenia: Secondary | ICD-10-CM | POA: Diagnosis not present

## 2019-12-07 DIAGNOSIS — F200 Paranoid schizophrenia: Secondary | ICD-10-CM | POA: Diagnosis not present

## 2019-12-08 DIAGNOSIS — F200 Paranoid schizophrenia: Secondary | ICD-10-CM | POA: Diagnosis not present

## 2019-12-09 DIAGNOSIS — F200 Paranoid schizophrenia: Secondary | ICD-10-CM | POA: Diagnosis not present

## 2019-12-11 DIAGNOSIS — F200 Paranoid schizophrenia: Secondary | ICD-10-CM | POA: Diagnosis not present

## 2019-12-12 DIAGNOSIS — F200 Paranoid schizophrenia: Secondary | ICD-10-CM | POA: Diagnosis not present

## 2019-12-30 ENCOUNTER — Encounter (HOSPITAL_COMMUNITY): Payer: Self-pay | Admitting: Family Medicine

## 2019-12-30 ENCOUNTER — Other Ambulatory Visit: Payer: Self-pay

## 2019-12-30 ENCOUNTER — Emergency Department (HOSPITAL_COMMUNITY)
Admission: EM | Admit: 2019-12-30 | Discharge: 2019-12-30 | Disposition: A | Discharge status: Home / Self Care | Payer: Medicare Other | Attending: Emergency Medicine | Admitting: Emergency Medicine

## 2019-12-30 DIAGNOSIS — R1084 Generalized abdominal pain: Secondary | ICD-10-CM | POA: Insufficient documentation

## 2019-12-30 DIAGNOSIS — F29 Unspecified psychosis not due to a substance or known physiological condition: Secondary | ICD-10-CM | POA: Diagnosis not present

## 2019-12-30 DIAGNOSIS — R52 Pain, unspecified: Secondary | ICD-10-CM | POA: Diagnosis not present

## 2019-12-30 DIAGNOSIS — Z79899 Other long term (current) drug therapy: Secondary | ICD-10-CM | POA: Diagnosis not present

## 2019-12-30 HISTORY — DX: Paranoid schizophrenia: F20.0

## 2019-12-30 HISTORY — DX: Hallucinations, unspecified: R44.3

## 2019-12-30 HISTORY — DX: Schizoaffective disorder, unspecified: F25.9

## 2019-12-30 LAB — COMPREHENSIVE METABOLIC PANEL
ALT: 15 U/L (ref 0–44)
AST: 19 U/L (ref 15–41)
Albumin: 4.2 g/dL (ref 3.5–5.0)
Alkaline Phosphatase: 44 U/L (ref 38–126)
Anion gap: 9 (ref 5–15)
BUN: 12 mg/dL (ref 6–20)
CO2: 26 mmol/L (ref 22–32)
Calcium: 9.5 mg/dL (ref 8.9–10.3)
Chloride: 107 mmol/L (ref 98–111)
Creatinine, Ser: 0.65 mg/dL (ref 0.44–1.00)
GFR calc Af Amer: >60 mL/min (ref >60)
GFR calc non Af Amer: >60 mL/min (ref >60)
Glucose, Bld: 119 mg/dL — ABNORMAL HIGH (ref 70–99)
Potassium: 3.7 mmol/L (ref 3.5–5.1)
Sodium: 142 mmol/L (ref 135–145)
Total Bilirubin: 0.4 mg/dL (ref 0.3–1.2)
Total Protein: 7.4 g/dL (ref 6.5–8.1)

## 2019-12-30 LAB — URINALYSIS, ROUTINE W REFLEX MICROSCOPIC
Bilirubin Urine: NEGATIVE
Glucose, UA: NEGATIVE mg/dL
Hgb urine dipstick: NEGATIVE
Ketones, ur: NEGATIVE mg/dL
Nitrite: NEGATIVE
Protein, ur: NEGATIVE mg/dL
Specific Gravity, Urine: 1.004 — ABNORMAL LOW (ref 1.005–1.030)
pH: 7 (ref 5.0–8.0)

## 2019-12-30 LAB — CBC WITH DIFFERENTIAL/PLATELET
Abs Immature Granulocytes: 0.05 10*3/uL (ref 0.00–0.07)
Basophils Absolute: 0 10*3/uL (ref 0.0–0.1)
Basophils Relative: 0 %
Eosinophils Absolute: 0.1 10*3/uL (ref 0.0–0.5)
Eosinophils Relative: 2 %
HCT: 36 % (ref 36.0–46.0)
Hemoglobin: 11.5 g/dL — ABNORMAL LOW (ref 12.0–15.0)
Immature Granulocytes: 1 %
Lymphocytes Relative: 30 %
Lymphs Abs: 1.4 10*3/uL (ref 0.7–4.0)
MCH: 28.9 pg (ref 26.0–34.0)
MCHC: 31.9 g/dL (ref 30.0–36.0)
MCV: 90.5 fL (ref 80.0–100.0)
Monocytes Absolute: 0.4 10*3/uL (ref 0.1–1.0)
Monocytes Relative: 9 %
Neutro Abs: 2.8 10*3/uL (ref 1.7–7.7)
Neutrophils Relative %: 58 %
Platelets: 281 10*3/uL (ref 150–400)
RBC: 3.98 MIL/uL (ref 3.87–5.11)
RDW: 14.3 % (ref 11.5–15.5)
WBC: 4.8 10*3/uL (ref 4.0–10.5)
nRBC: 0 % (ref 0.0–0.2)

## 2019-12-30 LAB — PREGNANCY, URINE: Preg Test, Ur: NEGATIVE

## 2019-12-30 LAB — LIPASE, BLOOD: Lipase: 34 U/L (ref 11–51)

## 2019-12-30 MED ORDER — ACETAMINOPHEN 325 MG PO TABS
650.0000 mg | ORAL_TABLET | Freq: Once | ORAL | Status: AC
  Administered 2019-12-30: 650 mg via ORAL
  Filled 2019-12-30: qty 2

## 2019-12-30 MED ORDER — DICYCLOMINE HCL 20 MG PO TABS: 20.0000 mg | ORAL_TABLET | Freq: Two times a day (BID) | ORAL | 0 refills | Status: DC

## 2019-12-30 MED ORDER — DICYCLOMINE HCL 10 MG PO CAPS
20.0000 mg | ORAL_CAPSULE | Freq: Once | ORAL | Status: AC
  Administered 2019-12-30: 20 mg via ORAL
  Filled 2019-12-30: qty 2

## 2019-12-30 NOTE — ED Triage Notes (Signed)
Patient was picked up from a psych therapy facility and transported via [facility]. Patient got into an argument with someone at the facility and started complaining of abd for the 30 minutes. Denies any nausea, vomiting, and diarrhea. Also, EMS states she has been talking to herself during transport. She refused to allow EMS to perform an assessment or obtain vital signs.

## 2019-12-30 NOTE — ED Provider Notes (Signed)
[facility]-EMERGENCY DEPT Provider Note   CSN: [id number] Arrival date & time: 12/30/19  1026     History Chief Complaint  Patient presents with  . Abdominal Pain    [name] [name] is a 44 y.o. female.  HPI  Patient is a 44 year old female with no significant past medical history other than depression and paranoid schizophrenia which she uses Zoloft and trazodone and Risperdal for respectively.  Patient resents today from her therapist office where she states that she was discussing " pain bills" such as rent electricity when she states that she experienced a gradual onset of generalized abdominal aching and cramping which she states is mild, nonfocal and with no associated fever, chills, nausea, vomiting, diarrhea, vaginal discharge/irritation or any urinary symptoms.  She states she is otherwise feeling well and states that her last menstrual period was on the 21st of last month and that she believes that she is experiencing the beginning of her cycle.  Patient states there is no chance that she could be pregnant she has not been sexually active in several months.  She denies any concern for STDs.  She denies any abdominal trauma.  States that she has never had any abdominal surgeries in the past.  Patient has a history of schizoaffective, paranoid schizophrenia and depression and has endorsed suicidal ideations in the past.  She denies any suicidal ideation, homicidal ideation or AVH today.  States she has no intent to hurt herself.    Past Medical History:  Diagnosis Date  . Arthritis   . Depression   . Hallucinations   . Paranoid schizophrenia (HCC)   . Schizoaffective disorder [facility])     Patient Active Problem List   Diagnosis Date Noted  . Homicidal ideations 11/07/2019  . Schizoaffective disorder (HCC) 10/16/2019  . Schizophrenia (HCC) 09/19/2019  . Depression 09/01/2019  . Suicidal ideation   . Homelessness 07/06/2019  . Adjustment disorder with  disturbance of emotion 07/06/2019  . Paranoid schizophrenia (HCC) 07/01/2019  . Hallucinations   . Schizoaffective disorder, bipolar type (HCC) 09/01/2018    History reviewed. No pertinent surgical history.   OB History   No obstetric history on file.     History reviewed. No pertinent family history.  Social History   Tobacco Use  . Smoking status: Never Smoker  . Smokeless tobacco: Never Used  Substance Use Topics  . Alcohol use: Not Currently    Comment: She denies   . Drug use: Not Currently    Comment: She denies     Home Medications Prior to Admission medications   Medication Sig Start Date End Date Taking? Authorizing Provider  OLANZapine (ZYPREXA) 15 MG tablet Take 15 mg by mouth at bedtime. 12/15/19  Yes [provider]  OLANZapine (ZYPREXA) 5 MG tablet Take 5 mg by mouth daily. 12/15/19  Yes [provider]  paliperidone (INVEGA SUSTENNA) 156 MG/ML SUSY injection Inject 1 mL (156 mg total) into the muscle once for 1 dose. DUE 12/13/2019 11/13/19 12/30/19 Yes Malvin Johns, MD  sertraline (ZOLOFT) 50 MG tablet Take 1 tablet (50 mg total) by mouth daily. 09/22/19  Yes Rankin, Shuvon B, NP  traZODone (DESYREL) 150 MG tablet Take 1 tablet (150 mg total) by mouth at bedtime as needed and may repeat dose one time if needed for sleep. 11/13/19  Yes Malvin Johns, MD  benztropine (COGENTIN) 1 MG tablet Take 1 tablet (1 mg total) by mouth 2 (two) times daily. Patient not taking: Reported on 12/30/2019 11/13/19  Malvin Johns, MD  dicyclomine (BENTYL) 20 MG tablet Take 1 tablet (20 mg total) by mouth 2 (two) times daily. 12/30/19   Gailen Shelter, PA  risperidone (RISPERDAL) 4 MG tablet Take 1 tablet (4 mg total) by mouth 2 (two) times daily. Patient not taking: Reported on 12/30/2019 11/13/19   Malvin Johns, MD    Allergies    Patient has no known allergies.  Review of Systems   Review of Systems  Constitutional: Negative for chills and fever.  HENT: Negative for  congestion.   Eyes: Negative for pain.  Respiratory: Negative for cough and shortness of breath.   Cardiovascular: Negative for chest pain and leg swelling.  Gastrointestinal: Positive for abdominal pain. Negative for vomiting.  Genitourinary: Negative for dysuria.  Musculoskeletal: Negative for myalgias.  Skin: Negative for rash.  Neurological: Negative for dizziness and headaches.    Physical Exam Updated Vital Signs BP (!) 142/86   Pulse 81   Temp 98.1 F (36.7 C) (Oral)   Resp 18   Ht 4\' 10"  (1.473 m)   Wt 49 kg   SpO2 99%   BMI 22.57 kg/m   Physical Exam Vitals and nursing note reviewed.  Constitutional:      General: She is not in acute distress.    Comments: Patient is 44 year old female appears stated age in no acute distress, pleasant, able answer questions verbally follow commands.  HENT:     Head: Normocephalic and atraumatic.     Nose: Nose normal.     Mouth/Throat:     Mouth: Mucous membranes are moist.  Eyes:     General: No scleral icterus. Cardiovascular:     Rate and Rhythm: Normal rate and regular rhythm.     Pulses: Normal pulses.     Heart sounds: Normal heart sounds.  Pulmonary:     Effort: Pulmonary effort is normal. No respiratory distress.     Breath sounds: No wheezing.  Abdominal:     Palpations: Abdomen is soft.     Tenderness: There is no abdominal tenderness. There is no right CVA tenderness, left CVA tenderness, guarding or rebound.     Comments: Negative Rovsing, negative obturator, negative McBurney, negative Murphy  Musculoskeletal:     Cervical back: Normal range of motion and neck supple.     Right lower leg: No edema.     Left lower leg: No edema.  Skin:    General: Skin is warm and dry.     Capillary Refill: Capillary refill takes less than 2 seconds.  Neurological:     Mental Status: She is alert. Mental status is at baseline.     Comments: AAOx3  Psychiatric:        Mood and Affect: Mood normal.        Behavior:  Behavior normal.     Comments: Makes good eye contact, thought process and thought content normal.  Judgment grossly normal.  Denies any SI/HI/AVH.  Paucity of words     ED Results / Procedures / Treatments   Labs (all labs ordered are listed, but only abnormal results are displayed) Labs Reviewed  CBC WITH DIFFERENTIAL/PLATELET - Abnormal; Notable for the following components:      Result Value   Hemoglobin 11.5 (*)    All other components within normal limits  COMPREHENSIVE METABOLIC PANEL - Abnormal; Notable for the following components:   Glucose, Bld 119 (*)    All other components within normal limits  URINALYSIS, ROUTINE W REFLEX MICROSCOPIC -  Abnormal; Notable for the following components:   Color, Urine STRAW (*)    Specific Gravity, Urine 1.004 (*)    Leukocytes,Ua TRACE (*)    Bacteria, UA RARE (*)    All other components within normal limits  LIPASE, BLOOD  PREGNANCY, URINE    EKG None  Radiology No results found.  Procedures Procedures (including critical care time)  Medications Ordered in ED Medications  dicyclomine (BENTYL) capsule 20 mg (20 mg Oral Given 12/30/19 1141)  acetaminophen (TYLENOL) tablet 650 mg (650 mg Oral Given 12/30/19 1140)    ED Course  I have reviewed the triage vital signs and the nursing notes.  Pertinent labs & imaging results that were available during my care of the patient were reviewed by me and considered in my medical decision making (see chart for details).    MDM Rules/Calculators/A&P                      [name] [name] is a 44 year old female with no significant past medical history of abdominal surgery history presented today with generalized abdominal aches.  He started just prior to arrival when she is having a discussion with her therapist involving bills.   Physical exam is largely unremarkable.  Her vital signs are within normal limits she is afebrile not tachycardic or hypoxic or tachypneic.  Her abdominal exam is  remarkable for no tenderness to palpation.  Mucous membranes are moist and she is tolerating p.o. without difficulty.  She has no nausea or vomiting.  She also denies any pelvic or vaginal pain which makes ectopic, ovarian torsion, PID or symptomatic STI, vaginitis unlikely.  She has no tenderness in right lower quadrant to indicate that she could be having appendicitis, no right upper quadrant tenderness or Murphy sign to indicate cholecystitis.  CBC with no leukocytosis or anemia CMP unremarkable with no anion gap-doubt new onset diabetes/DKA.  Neurological abnormalities indicate muscle cramps as etiology of her symptoms. UA is unremarkable and she has no urinary symptoms.  No negation for culture or treatment today. Urine pregnancy test is negative.  Lipase is within normal limits.  Doubt pancreatitis.  Patient given 1 dose of Tylenol and 1 dose of Bentyl and states that she feels significantly better on reassessment. 12:56 PM --repeat abdominal exam patient has no tenderness to palpation.  No guarding or rebound.  Negative McBurney and Rovsing, negative psoas.  Negative Murphy.  Considered psychiatric constipation symptoms as well as possible menstrual cycle symptoms.  She decision-making irritation patient she would prefer to defer CT imaging at this time.  I think this is reasonable and we will discharge patient with close return precautions.  She will follow up with her PCP.  As her symptoms improve dramatically with Bentyl I will provide her with a prescription for this.  She will follow up with your PCP within the week.    The medical records were personally reviewed by myself. I personally reviewed all lab results and interpreted all imaging studies and either concurred with their official read or contacted radiology for clarification.   This patient appears reasonably screened and I doubt any other medical condition requiring further workup, evaluation, or treatment in the ED at this  time prior to discharge.   Patient's vitals are WNL apart from vital sign abnormalities discussed above, patient is in NAD, and able to ambulate in the ED at their baseline and able to tolerate PO.  Pain has been managed or a plan has been  made for home management and has no complaints prior to discharge. Patient is comfortable with above plan and for discharge at this time. All questions were answered prior to disposition. Results from the ER workup discussed with the patient face to face and all questions answered to the best of my ability. The patient is safe for discharge with strict return precautions. Patient appears safe for discharge with appropriate follow-up. Conveyed my impression with the patient and they voiced understanding and are agreeable to plan.   An After Visit Summary was printed and given to the patient.  Portions of this note were generated with [employer]. Dictation errors may occur despite best attempts at proofreading.    Final Clinical Impression(s) / ED Diagnoses Final diagnoses:  Generalized abdominal pain    Rx / DC Orders ED Discharge Orders         Ordered    dicyclomine (BENTYL) 20 MG tablet  2 times daily     12/30/19 1255           Pati Gallo [place], [place] 12/30/19 [place], [place], DO 12/30/19 1303

## 2019-12-30 NOTE — Discharge Instructions (Addendum)
Take Bentyl as prescribed.  Please follow-up with your primary care doctor.  Please return to ED if you have any new or concerning symptoms.

## 2020-01-05 ENCOUNTER — Emergency Department (HOSPITAL_COMMUNITY)
Admission: EM | Admit: 2020-01-05 | Discharge: 2020-01-05 | Discharge status: Against Medical Advice | Payer: Medicare Other | Attending: Emergency Medicine | Admitting: Emergency Medicine

## 2020-01-05 ENCOUNTER — Encounter (HOSPITAL_COMMUNITY): Payer: Self-pay

## 2020-01-05 DIAGNOSIS — F200 Paranoid schizophrenia: Secondary | ICD-10-CM

## 2020-01-05 DIAGNOSIS — Z5329 Procedure and treatment not carried out because of patient's decision for other reasons: Secondary | ICD-10-CM | POA: Insufficient documentation

## 2020-01-05 DIAGNOSIS — N939 Abnormal uterine and vaginal bleeding, unspecified: Secondary | ICD-10-CM

## 2020-01-05 DIAGNOSIS — R1084 Generalized abdominal pain: Secondary | ICD-10-CM | POA: Diagnosis not present

## 2020-01-05 DIAGNOSIS — R58 Hemorrhage, not elsewhere classified: Secondary | ICD-10-CM | POA: Diagnosis not present

## 2020-01-05 DIAGNOSIS — Z79899 Other long term (current) drug therapy: Secondary | ICD-10-CM | POA: Diagnosis not present

## 2020-01-05 DIAGNOSIS — R52 Pain, unspecified: Secondary | ICD-10-CM | POA: Diagnosis not present

## 2020-01-05 DIAGNOSIS — F22 Delusional disorders: Secondary | ICD-10-CM | POA: Diagnosis not present

## 2020-01-05 NOTE — ED Notes (Signed)
ED Provider at bedside. 

## 2020-01-05 NOTE — ED Notes (Addendum)
Patient yelling in room. Stating "I feel unsafe and someone is trying to kill me." MD at bedside. Patient reports intentions of leaving. Provider explained risks of leaving AMA.

## 2020-01-05 NOTE — ED Notes (Signed)
Patient and belongings not found in room. Patient left prior to receiving printed AMA paperwork.

## 2020-01-05 NOTE — ED Provider Notes (Signed)
[facility]-EMERGENCY DEPT Provider Note   CSN: [id number] Arrival date & time: 01/05/20  1136     History Chief Complaint  Patient presents with  . Abdominal Pain  . Vaginal Bleeding    [name] [name] is a 44 y.o. female.  HPI Patient here for evaluation of vaginal bleeding. She reports onset of her regular menses yesterday but was particularly heavy today. She was apparently at her Counselor's office being evaluated for her long standing paranoid schizophrenia. EMS was called for evaluation and the patient was initially uncooperative but eventually consented to transport. On my initial evaluation she is calm and cooperative. Reports some vaginal bleeding with cramping, improved with Aleve. Denies discharge. Denies pregnancy. Denies trauma/injury,     Past Medical History:  Diagnosis Date  . Arthritis   . Depression   . Hallucinations   . Paranoid schizophrenia (HCC)   . Schizoaffective disorder [facility])     Patient Active Problem List   Diagnosis Date Noted  . Homicidal ideations 11/07/2019  . Schizoaffective disorder (HCC) 10/16/2019  . Schizophrenia (HCC) 09/19/2019  . Depression 09/01/2019  . Suicidal ideation   . Homelessness 07/06/2019  . Adjustment disorder with disturbance of emotion 07/06/2019  . Paranoid schizophrenia (HCC) 07/01/2019  . Hallucinations   . Schizoaffective disorder, bipolar type (HCC) 09/01/2018    No past surgical history on file.   OB History   No obstetric history on file.     No family history on file.  Social History   Tobacco Use  . Smoking status: Never Smoker  . Smokeless tobacco: Never Used  Substance Use Topics  . Alcohol use: Not Currently    Comment: She denies   . Drug use: Not Currently    Comment: She denies     Home Medications Prior to Admission medications   Medication Sig Start Date End Date Taking? Authorizing Provider  benztropine (COGENTIN) 1 MG tablet Take 1 tablet (1 mg total) by mouth  2 (two) times daily. Patient not taking: Reported on 12/30/2019 11/13/19   Malvin Johns, MD  dicyclomine (BENTYL) 20 MG tablet Take 1 tablet (20 mg total) by mouth 2 (two) times daily. 12/30/19   Fondaw, Wylder S, PA  OLANZapine (ZYPREXA) 15 MG tablet Take 15 mg by mouth at bedtime. 12/15/19   [provider]  OLANZapine (ZYPREXA) 5 MG tablet Take 5 mg by mouth daily. 12/15/19   [provider]  paliperidone (INVEGA SUSTENNA) 156 MG/ML SUSY injection Inject 1 mL (156 mg total) into the muscle once for 1 dose. DUE 12/13/2019 11/13/19 12/30/19  Malvin Johns, MD  risperidone (RISPERDAL) 4 MG tablet Take 1 tablet (4 mg total) by mouth 2 (two) times daily. Patient not taking: Reported on 12/30/2019 11/13/19   Malvin Johns, MD  sertraline (ZOLOFT) 50 MG tablet Take 1 tablet (50 mg total) by mouth daily. 09/22/19   Rankin, Shuvon B, NP  traZODone (DESYREL) 150 MG tablet Take 1 tablet (150 mg total) by mouth at bedtime as needed and may repeat dose one time if needed for sleep. 11/13/19   Malvin Johns, MD    Allergies    Patient has no known allergies.  Review of Systems   Review of Systems  Constitutional: Negative for fever.  HENT: Negative for congestion and sore throat.   Respiratory: Negative for cough and shortness of breath.   Cardiovascular: Negative for chest pain.  Gastrointestinal: Negative for abdominal pain, diarrhea, nausea and vomiting.  Genitourinary: Positive for vaginal bleeding. Negative  for dysuria.  Musculoskeletal: Negative for myalgias.  Skin: Negative for rash.  Neurological: Negative for headaches.  Psychiatric/Behavioral: Negative for behavioral problems.    Physical Exam Updated Vital Signs BP 116/72 (BP Location: Right Arm)   Pulse 85   Temp 98.1 F (36.7 C) (Oral)   Resp 20   Ht 4\' 10"  (1.473 m)   Wt 48.5 kg   LMP 01/04/2020 (Exact Date)   SpO2 99%   BMI 22.36 kg/m   Physical Exam Constitutional:      Appearance: Normal appearance.  HENT:     Head:  Normocephalic and atraumatic.     Nose: Nose normal.     Mouth/Throat:     Mouth: Mucous membranes are dry.  Eyes:     Extraocular Movements: Extraocular movements intact.     Conjunctiva/sclera: Conjunctivae normal.  Cardiovascular:     Rate and Rhythm: Normal rate.  Pulmonary:     Effort: Pulmonary effort is normal.     Breath sounds: Normal breath sounds.  Abdominal:     General: Abdomen is flat.     Palpations: Abdomen is soft.     Tenderness: There is no abdominal tenderness.  Musculoskeletal:        General: No swelling. Normal range of motion.     Cervical back: Neck supple.  Skin:    General: Skin is warm and dry.  Neurological:     General: No focal deficit present.     Mental Status: She is alert.  Psychiatric:        Mood and Affect: Mood normal.     Comments: Flat affect     ED Results / Procedures / Treatments   Labs (all labs ordered are listed, but only abnormal results are displayed) Labs Reviewed - No data to display  EKG None  Radiology No results found.  Procedures Procedures (including critical care time)  Medications Ordered in ED Medications - No data to display  ED Course  I have reviewed the triage vital signs and the nursing notes.  Pertinent labs & imaging results that were available during my care of the patient were reviewed by me and considered in my medical decision making (see chart for details).  Clinical Course as of Jan 05 1240  Mon Jan 05, 2020  1236 Shortly after my initial evaluation during which I discussed bloodwork, urine preg and pelvic exam the patient was noted to be pacing the room, stating she 'needs to go' because 'someone is trying to kill her' and she wants to make a police report. She is asking to leave, she understands that without a full evaluation I cannot ascertain if her vaginal bleeding is normal menstruation or something more serious. She is not having SI/HI and is not a danger to herself. She has chronic  paranoia and does not wish to stay in the ED for any further evaluation. She was encouraged to return at any time.    [CS]    Clinical Course User Index [CS] Truddie Hidden, MD   MDM Rules/Calculators/A&P                     Final Clinical Impression(s) / ED Diagnoses Final diagnoses:  Vaginal bleeding  Paranoid schizophrenia [facility])    Rx / DC Orders ED Discharge Orders    None       Truddie Hidden, MD 01/05/20 1241

## 2020-01-05 NOTE — ED Triage Notes (Addendum)
44 yo female called 911 for vaginal bleeding and abd pain for the past 1 day. Pt was picked up outside counselor's office. Uncooperative with EMS. Refusing vitals. Hx of paranoid/schizophrenia. Counselor was going to IVC pt but was agreeable to having her be seen in ED. Refused vitals for EMS. Pt sts she started her menses on 01/04/20 but bleeding heavier than normal.

## 2020-01-10 IMAGING — CT CT RENAL STONE PROTOCOL
2 of 4 series · 15 of 46 positions shown, 17 images · non-contrast
Comparison: None available.

CLINICAL DATA: Initial evaluation for acute hematuria.

EXAM:
CT ABDOMEN AND PELVIS WITHOUT CONTRAST
TECHNIQUE: Multidetector CT imaging of the abdomen and pelvis was performed
following the standard protocol without IV contrast.

[Series 3: ap without · axial · non-contrast · 0.79mm/px · z∈[+763,+1138]mm · 12 of 85 slices shown, 14 images]
[im 5/85  soft-tissue]
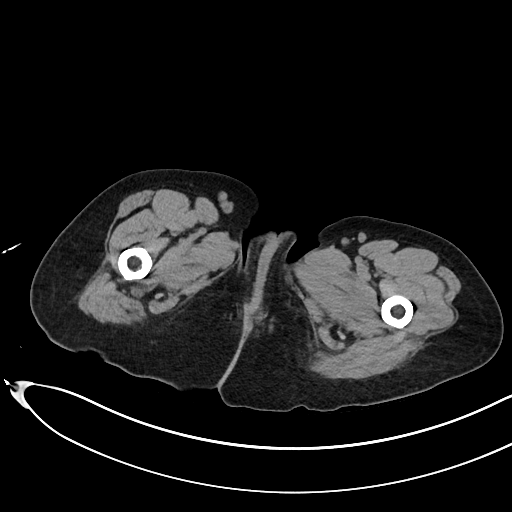
[im 5/85  bone]
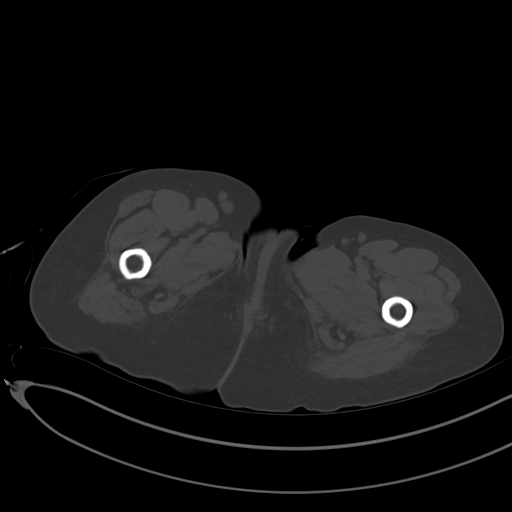
[im 15/85  soft-tissue]
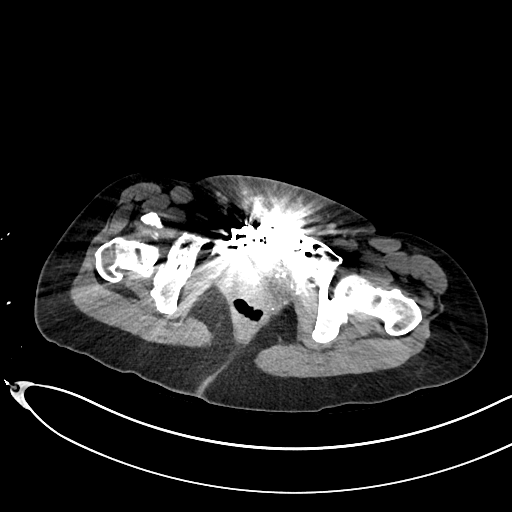
[im 19/85  soft-tissue]
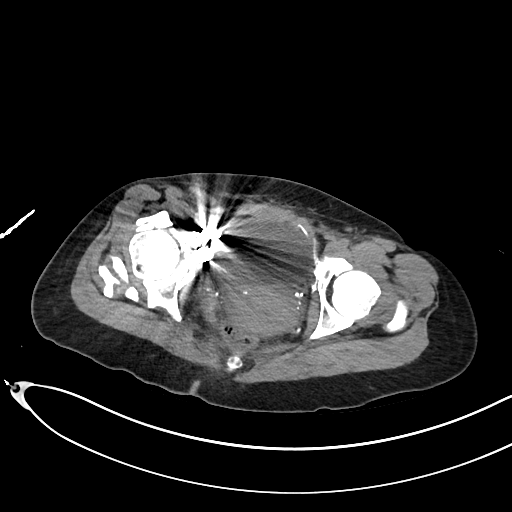
[im 24/85  soft-tissue]
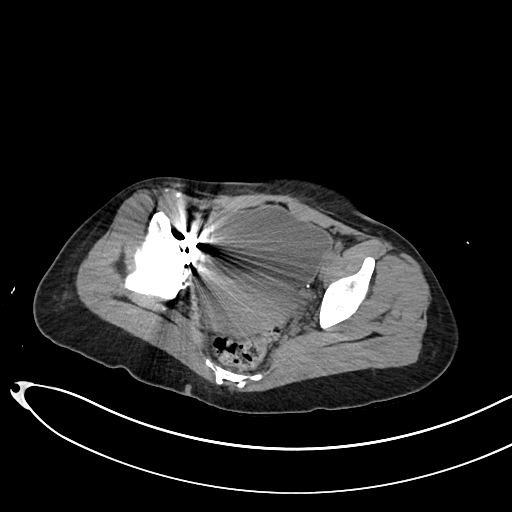
[im 33/85  soft-tissue]
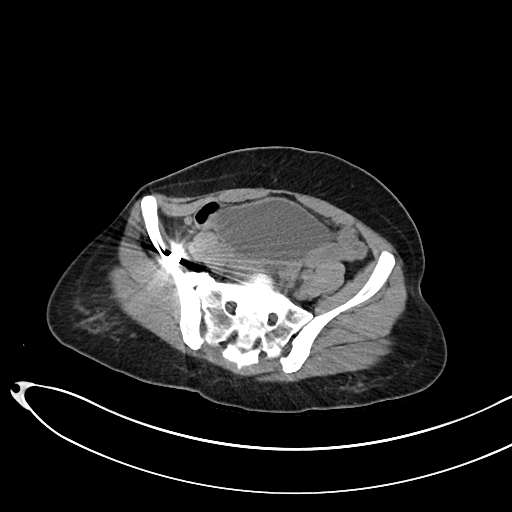
[im 38/85  soft-tissue]
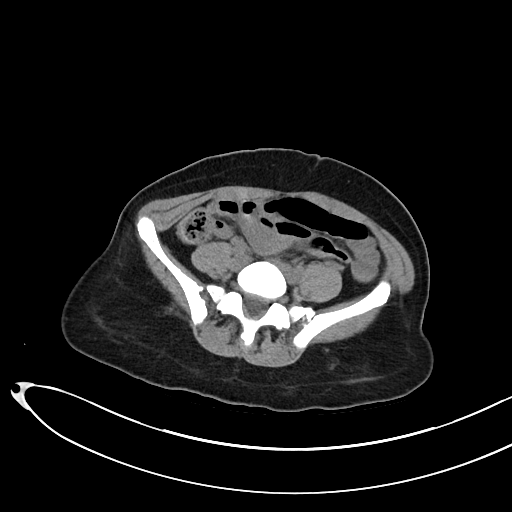
[im 47/85  soft-tissue]
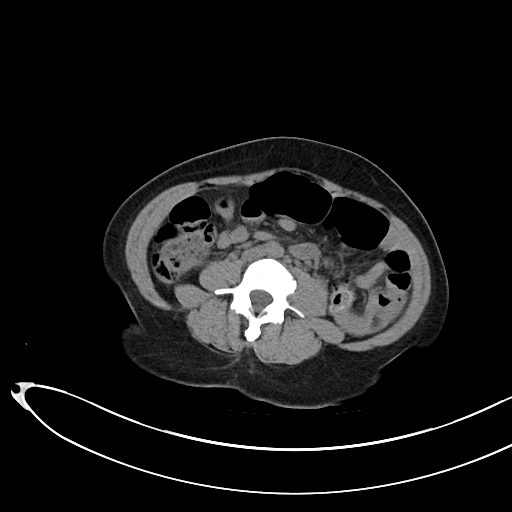
[im 52/85  soft-tissue]
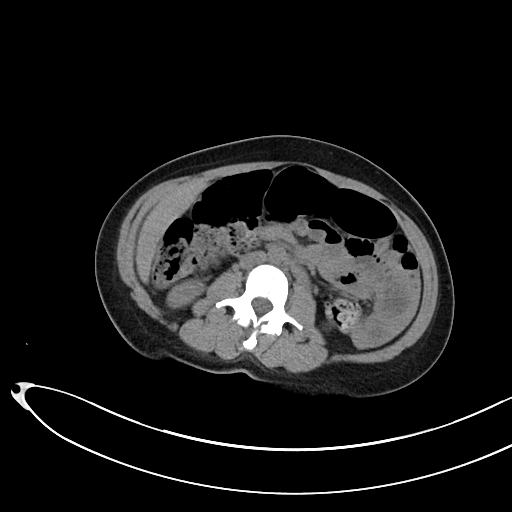
[im 61/85  soft-tissue]
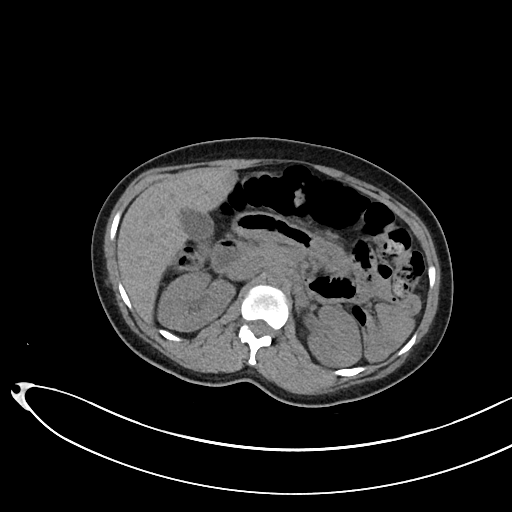
[im 61/85  bone]
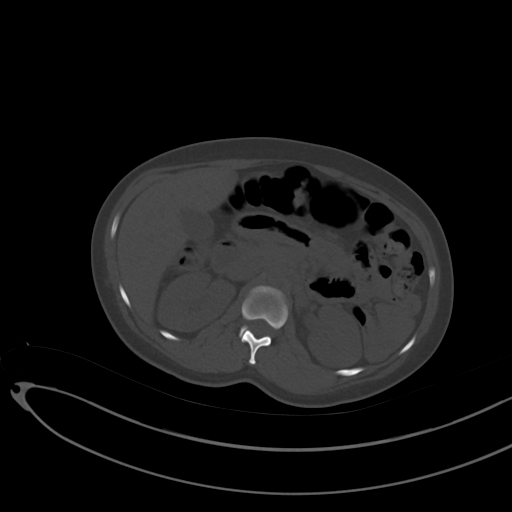
[im 66/85  soft-tissue]
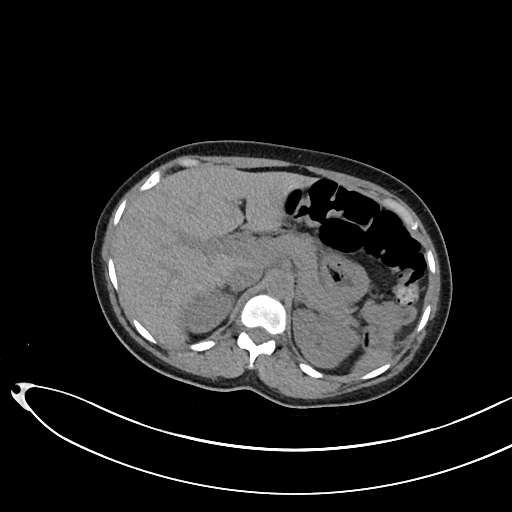
[im 71/85  soft-tissue]
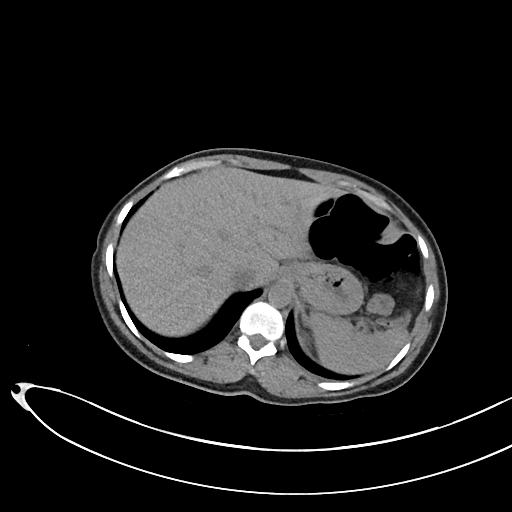
[im 80/85  soft-tissue]
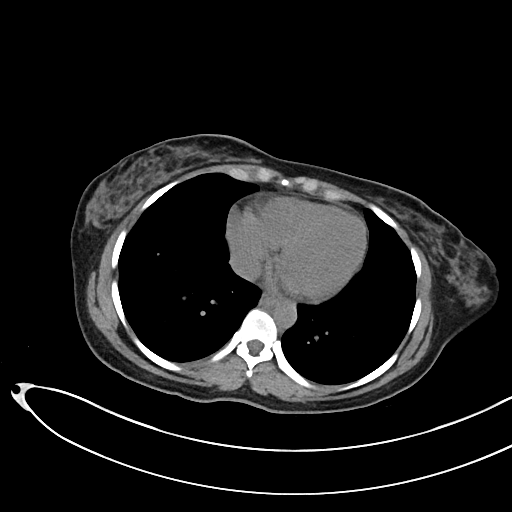

[Series 6: cor · coronal · 0.60mm/px · 3 of 101 slices shown]
[im 34/101  soft-tissue]
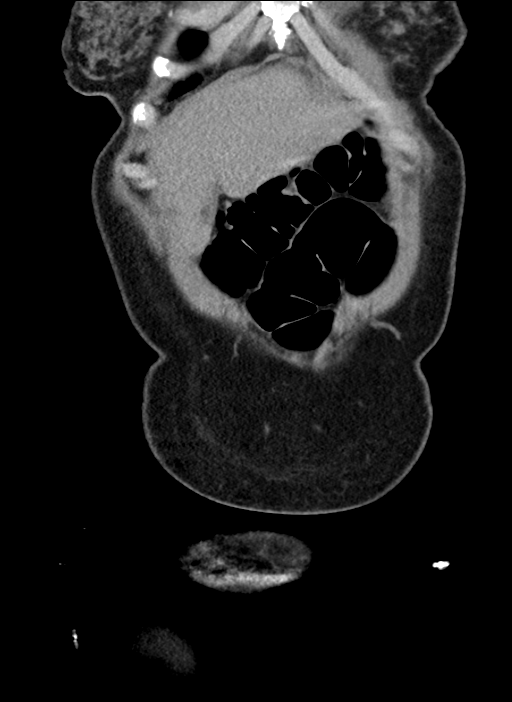
[im 45/101  soft-tissue]
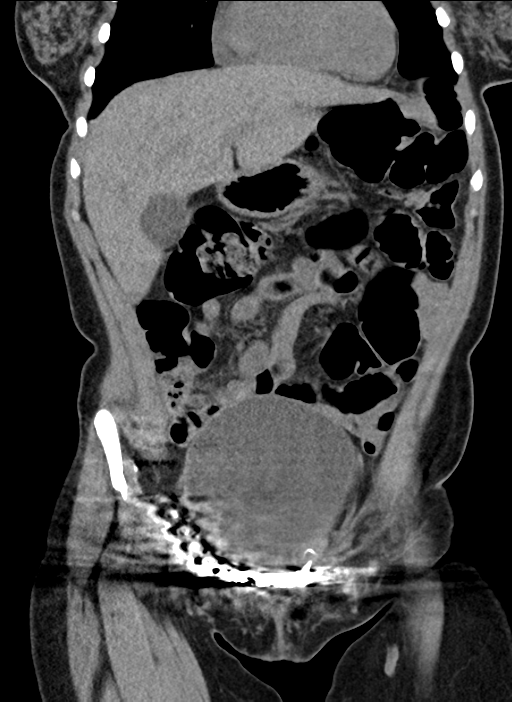
[im 56/101  soft-tissue]
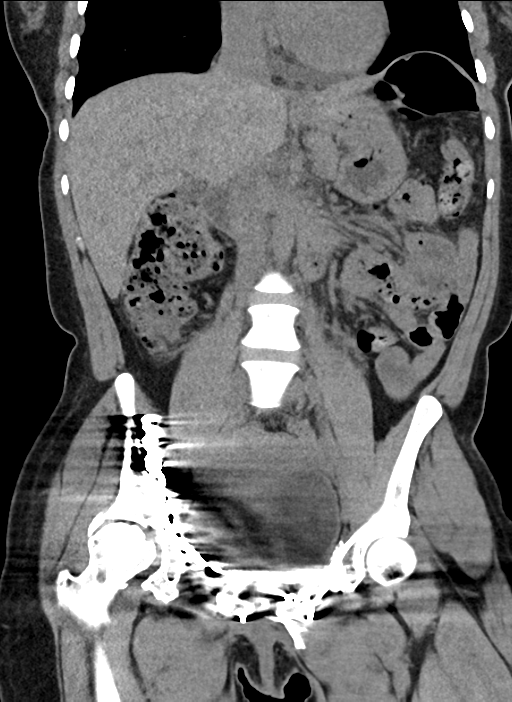

[15 of 46 positions shown; findings below may reference images not displayed]

FINDINGS: Lower chest: Visualized lung bases are clear. Subcentimeter
granuloma noted within the right middle lobe.

Hepatobiliary: Liver demonstrates a normal unenhanced appearance.
Subcentimeter hypodensity within the central aspect of the liver too
small the characterize, but may reflect a small cyst. Gallbladder
normal. No biliary dilatation.

Pancreas: Pancreas within normal limits.

Spleen: Spleen within normal limits.

Adrenals/Urinary Tract: Adrenal glands are normal. Kidneys equal in
size without evidence for nephrolithiasis or hydronephrosis. No
definite calculi seen along the course of either renal collecting
system. No hydroureter. Please note evaluation somewhat limited by
streak artifact from fixation hardware about the pelvis. Bladder
moderately distended without definite abnormality. No appreciable
layering stones within the bladder lumen.

Stomach/Bowel: Stomach within normal limits. No evidence for bowel
obstruction. Normal appendix. No acute inflammatory changes seen
about the bowels.

Vascular/Lymphatic: Intra-abdominal aorta of normal caliber. No
adenopathy.

Reproductive: Calcification within the uterus likely related to
fibroid. Uterus and ovaries otherwise grossly within normal limits.

Other: No free air or fluid. Small fat containing paraumbilical
hernia noted without associated inflammation.

Musculoskeletal: Multiple remotely healed pelvic fractures with
sequelae of prior ORIF. No acute osseous abnormality. No worrisome
lytic or blastic osseous lesions.
IMPRESSION: 1. No CT evidence for nephrolithiasis or obstructive uropathy.
2. No other acute intra-abdominal or pelvic process.
3. Fibroid uterus.
4. Multiple remotely healed pelvic fractures with sequelae of prior
ORIF.

## 2020-02-06 ENCOUNTER — Ambulatory Visit (HOSPITAL_COMMUNITY)
Admission: AD | Admit: 2020-02-06 | Discharge: 2020-02-06 | Disposition: A | Discharge status: Home / Self Care | Payer: Medicare Other | Attending: Psychiatry | Admitting: Psychiatry

## 2020-02-06 DIAGNOSIS — F209 Schizophrenia, unspecified: Secondary | ICD-10-CM | POA: Insufficient documentation
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
## 2020-02-06 NOTE — H&P (Addendum)
Behavioral Health Medical Screening Exam  [name] [name] is an 44 y.o. female who was brought by the police to [facility] for unclear reasons. Upon arrival reported that "I want to go home. I have an appointment. Yes I am taking my medications. I just have some issues." Her chart indicates a history of schizophrenia. She was seen by Dr. Jeannine Kitten who felt patient was at her baseline. She was provided with transportation home. Patient has previously been inpatient at [facility]. Reports that she is compliant with her psychiatric medications with her current outpatient Provider.   Total Time spent with patient: 20 minutes  Psychiatric Specialty Exam: Physical Exam  Review of Systems  Respiratory: Negative for cough, choking, chest tightness and shortness of breath.   Cardiovascular: Negative for chest pain and leg swelling.  Gastrointestinal: Negative for abdominal distention and abdominal pain.  Neurological: Negative for dizziness, tremors, syncope, facial asymmetry, weakness and headaches.    There were no vitals taken for this visit.There is no height or weight on file to calculate BMI.  General Appearance: Casual  Eye Contact:  Fair  Speech:  Garbled  Volume:  Decreased  Mood:  Anxious  Affect:  Blunt  Thought Process:  Coherent  Orientation:  Full (Time, Place, and Person)  Thought Content:  Rumination  Suicidal Thoughts:  No  Homicidal Thoughts:  No  Memory:  Immediate;   Fair Recent;   Fair Remote;   Fair  Judgement:  Fair  Insight:  Shallow  Psychomotor Activity:  Normal  Concentration: Concentration: Fair and Attention Span: Fair  Recall:  [employer] of Knowledge:Fair  Language: Fair  Akathisia:  No  Handed:  Right  AIMS (if indicated):     Assets:  Desire for Improvement Housing Leisure Time Physical Health Resilience  Sleep:       Musculoskeletal: Strength & Muscle Tone: within normal limits Gait & Station: normal Patient leans: N/A  There were no vitals taken for this  visit.  Recommendations:  Based on my evaluation the patient does not appear to have an emergency medical condition.  Patient seen and examined in the waiting area she is alert and oriented she denied auditory or visual hallucinations and denied her previously expressed delusional material she denies thoughts of harming self or others she was nonspecific as to why she sought help but I do find her to be at baseline without acute dangerousness and can be discharged  Fransisca Kaufmann, NP 02/06/2020, 1:33 PM

## 2020-02-12 DIAGNOSIS — F209 Schizophrenia, unspecified: Secondary | ICD-10-CM | POA: Diagnosis not present

## 2020-02-14 ENCOUNTER — Emergency Department (HOSPITAL_COMMUNITY)
Admission: EM | Admit: 2020-02-14 | Discharge: 2020-02-14 | Disposition: A | Discharge status: Home / Self Care | Payer: Medicare Other | Attending: Emergency Medicine | Admitting: Emergency Medicine

## 2020-02-14 ENCOUNTER — Encounter (HOSPITAL_COMMUNITY): Payer: Self-pay | Admitting: *Deleted

## 2020-02-14 DIAGNOSIS — R110 Nausea: Secondary | ICD-10-CM | POA: Diagnosis not present

## 2020-02-14 DIAGNOSIS — R42 Dizziness and giddiness: Secondary | ICD-10-CM | POA: Diagnosis not present

## 2020-02-14 DIAGNOSIS — Z79899 Other long term (current) drug therapy: Secondary | ICD-10-CM | POA: Insufficient documentation

## 2020-02-14 LAB — I-STAT CHEM 8, ED
BUN: 9 mg/dL (ref 6–20)
Calcium, Ion: 1.25 mmol/L (ref 1.15–1.40)
Chloride: 101 mmol/L (ref 98–111)
Creatinine, Ser: 0.6 mg/dL (ref 0.44–1.00)
Glucose, Bld: 81 mg/dL (ref 70–99)
HCT: 37 % (ref 36.0–46.0)
Hemoglobin: 12.6 g/dL (ref 12.0–15.0)
Potassium: 3.5 mmol/L (ref 3.5–5.1)
Sodium: 139 mmol/L (ref 135–145)
TCO2: 27 mmol/L (ref 22–32)

## 2020-02-14 LAB — I-STAT BETA HCG BLOOD, ED (MC, WL, AP ONLY): I-stat hCG, quantitative: <5 m[IU]/mL (ref <5)

## 2020-02-14 MED ORDER — ONDANSETRON 4 MG PO TBDP: 4.0000 mg | ORAL_TABLET | Freq: Three times a day (TID) | ORAL | 0 refills | Status: DC | PRN

## 2020-02-14 MED ORDER — ONDANSETRON 8 MG PO TBDP
8.0000 mg | ORAL_TABLET | Freq: Once | ORAL | Status: AC
  Administered 2020-02-14: 13:00:00 8 mg via ORAL
  Filled 2020-02-14: qty 1

## 2020-02-14 NOTE — ED Notes (Signed)
Pt would not allow VS nor sign discharge, instructions reviewed with pt

## 2020-02-14 NOTE — ED Provider Notes (Signed)
[facility]-EMERGENCY DEPT Provider Note   CSN: [id number] Arrival date & time: 02/14/20  1149     History Chief Complaint  Patient presents with  . Nausea    [name] [name] is a 44 y.o. female.  HPI    Patient presents concern of dizziness and nausea.  She notes that this began about half an hour prior to ED arrival.  No clear precipitant provided, though she notes that she feels generally poorly.  She denies any pain, denies fever.  She acknowledges a history of psychiatric disease, states that she takes medication regularly, does not also recent changes in dosage, nor therapy. Since onset of her dizziness, nausea, reviewed, exacerbating factors, syncope, pain, as above.  Past Medical History:  Diagnosis Date  . Arthritis   . Depression   . Hallucinations   . Paranoid schizophrenia (HCC)   . Schizoaffective disorder [facility])     Patient Active Problem List   Diagnosis Date Noted  . Homicidal ideations 11/07/2019  . Schizoaffective disorder (HCC) 10/16/2019  . Schizophrenia (HCC) 09/19/2019  . Depression 09/01/2019  . Suicidal ideation   . Homelessness 07/06/2019  . Adjustment disorder with disturbance of emotion 07/06/2019  . Paranoid schizophrenia (HCC) 07/01/2019  . Hallucinations   . Schizoaffective disorder, bipolar type (HCC) 09/01/2018    History reviewed. No pertinent surgical history.   OB History   No obstetric history on file.     No family history on file.  Social History   Tobacco Use  . Smoking status: Never Smoker  . Smokeless tobacco: Never Used  Substance Use Topics  . Alcohol use: Not Currently    Comment: She denies   . Drug use: Not Currently    Comment: She denies     Home Medications Prior to Admission medications   Medication Sig Start Date End Date Taking? Authorizing Provider  benztropine (COGENTIN) 1 MG tablet Take 1 tablet (1 mg total) by mouth 2 (two) times daily. Patient not taking: Reported on  12/30/2019 11/13/19   Malvin Johns, MD  dicyclomine (BENTYL) 20 MG tablet Take 1 tablet (20 mg total) by mouth 2 (two) times daily. 12/30/19   Fondaw, Wylder S, PA  OLANZapine (ZYPREXA) 15 MG tablet Take 15 mg by mouth at bedtime. 12/15/19   [provider]  OLANZapine (ZYPREXA) 5 MG tablet Take 5 mg by mouth daily. 12/15/19   [provider]  paliperidone (INVEGA SUSTENNA) 156 MG/ML SUSY injection Inject 1 mL (156 mg total) into the muscle once for 1 dose. DUE 12/13/2019 11/13/19 12/30/19  Malvin Johns, MD  risperidone (RISPERDAL) 4 MG tablet Take 1 tablet (4 mg total) by mouth 2 (two) times daily. Patient not taking: Reported on 12/30/2019 11/13/19   Malvin Johns, MD  sertraline (ZOLOFT) 50 MG tablet Take 1 tablet (50 mg total) by mouth daily. 09/22/19   Rankin, Shuvon B, NP  traZODone (DESYREL) 150 MG tablet Take 1 tablet (150 mg total) by mouth at bedtime as needed and may repeat dose one time if needed for sleep. 11/13/19   Malvin Johns, MD    Allergies    Patient has no known allergies.  Review of Systems   Review of Systems  Constitutional:       Per HPI, otherwise negative  HENT:       Per HPI, otherwise negative  Respiratory:       Per HPI, otherwise negative  Cardiovascular:       Per HPI, otherwise negative  Gastrointestinal:  Positive for nausea. Negative for vomiting.  Endocrine:       Negative aside from HPI  Genitourinary:       Neg aside from HPI   Musculoskeletal:       Per HPI, otherwise negative  Skin: Negative.   Neurological: Positive for dizziness. Negative for syncope.  Psychiatric/Behavioral: Positive for decreased concentration, dysphoric mood and suicidal ideas.    Physical Exam Updated Vital Signs BP 134/84 (BP Location: Right Arm)   Pulse 92   Temp 97.7 F (36.5 C) (Oral)   Resp 16   Ht 4\' 10"  (1.473 m)   Wt 48.5 kg   SpO2 97%   BMI 22.36 kg/m   Physical Exam Vitals and nursing note reviewed.  Constitutional:      General: She is not in  acute distress.    Appearance: She is well-developed.  HENT:     Head: Normocephalic and atraumatic.  Eyes:     Conjunctiva/sclera: Conjunctivae normal.  Cardiovascular:     Rate and Rhythm: Normal rate and regular rhythm.  Pulmonary:     Effort: Pulmonary effort is normal. No respiratory distress.     Breath sounds: Normal breath sounds. No stridor.  Abdominal:     General: There is no distension.  Skin:    General: Skin is warm and dry.  Neurological:     Mental Status: She is alert and oriented to person, place, and time.     Cranial Nerves: No cranial nerve deficit.  Psychiatric:        Mood and Affect: Affect is flat.        Behavior: Behavior is slowed and withdrawn.        Thought Content: Thought content does not include homicidal or suicidal ideation. Thought content does not include homicidal or suicidal plan.     ED Results / Procedures / Treatments   Labs (all labs ordered are listed, but only abnormal results are displayed) Labs Reviewed  URINALYSIS, ROUTINE W REFLEX MICROSCOPIC  I-STAT BETA HCG BLOOD, ED ([facility], [facility], [facility] ONLY)  I-STAT CHEM 8, ED    Procedures Procedures (including critical care time)  Medications Ordered in ED Medications  ondansetron (ZOFRAN-ODT) disintegrating tablet 8 mg (8 mg Oral Given 02/14/20 1242)    ED Course  I have reviewed the triage vital signs and the nursing notes.  Pertinent labs & imaging results that were available during my care of the patient were reviewed by me and considered in my medical decision making (see chart for details).  4:34 PM Patient in no distress, awake, alert, states that she feels better.  This well-appearing, adult female presents with concern of dizziness, patient has no pain, benign abdomen, has no hemodynamic instability suggesting occult intra-abdominal infection, peritonitis. She has no focal neurologic deficiencies suggesting CNS pathology she has no pain suggesting injury. Labs reassuring, and  with her history of multiple prior evaluations, 11 preceding 6 months, patient discharged in stable condition to follow-up with outpatient evaluation.  Final Clinical Impression(s) / ED Diagnoses Final diagnoses:  Nausea  Dizziness     Carmin Muskrat, MD 02/14/20 [phone]

## 2020-02-14 NOTE — Discharge Instructions (Signed)
As discussed, your evaluation today has been largely reassuring.  But, it is important that you monitor your condition carefully, and do not hesitate to return to the ED if you develop new, or concerning changes in your condition. ? ?Otherwise, please follow-up with your physician for appropriate ongoing care. ? ?
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
## 2020-02-14 NOTE — ED Notes (Signed)
Patient notified of need for urine sample.  

## 2020-02-14 NOTE — ED Notes (Signed)
Patient notified again of need for urine sample. Patient stated she did not need to go right now. Vitals updated.

## 2020-02-17 DIAGNOSIS — Z8659 Personal history of other mental and behavioral disorders: Secondary | ICD-10-CM | POA: Diagnosis not present

## 2020-02-17 DIAGNOSIS — Z79899 Other long term (current) drug therapy: Secondary | ICD-10-CM | POA: Diagnosis not present

## 2020-02-17 DIAGNOSIS — Z3202 Encounter for pregnancy test, result negative: Secondary | ICD-10-CM | POA: Diagnosis not present

## 2020-02-17 DIAGNOSIS — R519 Headache, unspecified: Secondary | ICD-10-CM | POA: Diagnosis not present

## 2020-02-17 DIAGNOSIS — R109 Unspecified abdominal pain: Secondary | ICD-10-CM | POA: Diagnosis not present

## 2020-02-24 DIAGNOSIS — Z03818 Encounter for observation for suspected exposure to other biological agents ruled out: Secondary | ICD-10-CM | POA: Diagnosis not present

## 2020-03-03 DIAGNOSIS — F258 Other schizoaffective disorders: Secondary | ICD-10-CM | POA: Diagnosis not present

## 2020-03-10 ENCOUNTER — Emergency Department (HOSPITAL_COMMUNITY)
Admission: EM | Admit: 2020-03-10 | Discharge: 2020-03-10 | Disposition: A | Discharge status: Home / Self Care | Payer: Medicare Other | Attending: Emergency Medicine | Admitting: Emergency Medicine

## 2020-03-10 DIAGNOSIS — F250 Schizoaffective disorder, bipolar type: Secondary | ICD-10-CM | POA: Insufficient documentation

## 2020-03-10 DIAGNOSIS — R443 Hallucinations, unspecified: Secondary | ICD-10-CM | POA: Diagnosis not present

## 2020-03-10 DIAGNOSIS — Z046 Encounter for general psychiatric examination, requested by authority: Secondary | ICD-10-CM | POA: Diagnosis present

## 2020-03-10 DIAGNOSIS — Z20822 Contact with and (suspected) exposure to covid-19: Secondary | ICD-10-CM | POA: Insufficient documentation

## 2020-03-10 DIAGNOSIS — F209 Schizophrenia, unspecified: Secondary | ICD-10-CM | POA: Insufficient documentation

## 2020-03-10 DIAGNOSIS — Z79899 Other long term (current) drug therapy: Secondary | ICD-10-CM | POA: Diagnosis not present

## 2020-03-10 DIAGNOSIS — Z03818 Encounter for observation for suspected exposure to other biological agents ruled out: Secondary | ICD-10-CM | POA: Diagnosis not present

## 2020-03-10 LAB — COMPREHENSIVE METABOLIC PANEL
ALT: 16 U/L (ref 0–44)
AST: 17 U/L (ref 15–41)
Albumin: 3.8 g/dL (ref 3.5–5.0)
Alkaline Phosphatase: 48 U/L (ref 38–126)
Anion gap: 8 (ref 5–15)
BUN: 13 mg/dL (ref 6–20)
CO2: 25 mmol/L (ref 22–32)
Calcium: 9.3 mg/dL (ref 8.9–10.3)
Chloride: 105 mmol/L (ref 98–111)
Creatinine, Ser: 0.71 mg/dL (ref 0.44–1.00)
GFR calc Af Amer: >60 mL/min (ref >60)
GFR calc non Af Amer: >60 mL/min (ref >60)
Glucose, Bld: 100 mg/dL — ABNORMAL HIGH (ref 70–99)
Potassium: 3.8 mmol/L (ref 3.5–5.1)
Sodium: 138 mmol/L (ref 135–145)
Total Bilirubin: 0.2 mg/dL — ABNORMAL LOW (ref 0.3–1.2)
Total Protein: 6.6 g/dL (ref 6.5–8.1)

## 2020-03-10 LAB — ETHANOL: Alcohol, Ethyl (B): <10 mg/dL (ref <10)

## 2020-03-10 LAB — CBC
HCT: 38.3 % (ref 36.0–46.0)
Hemoglobin: 11.9 g/dL — ABNORMAL LOW (ref 12.0–15.0)
MCH: 28.5 pg (ref 26.0–34.0)
MCHC: 31.1 g/dL (ref 30.0–36.0)
MCV: 91.6 fL (ref 80.0–100.0)
Platelets: 306 10*3/uL (ref 150–400)
RBC: 4.18 MIL/uL (ref 3.87–5.11)
RDW: 13.3 % (ref 11.5–15.5)
WBC: 7 10*3/uL (ref 4.0–10.5)
nRBC: 0 % (ref 0.0–0.2)

## 2020-03-10 LAB — ACETAMINOPHEN LEVEL: Acetaminophen (Tylenol), Serum: <10 ug/mL — ABNORMAL LOW (ref 10–30)

## 2020-03-10 LAB — RESPIRATORY PANEL BY RT PCR (FLU A&B, COVID)
Influenza A by PCR: NEGATIVE
Influenza B by PCR: NEGATIVE
SARS Coronavirus 2 by RT PCR: NEGATIVE

## 2020-03-10 LAB — SALICYLATE LEVEL: Salicylate Lvl: <7 mg/dL — ABNORMAL LOW (ref 7.0–30.0)

## 2020-03-10 MED ORDER — OLANZAPINE 5 MG PO TBDP
10.0000 mg | ORAL_TABLET | Freq: Once | ORAL | Status: AC
  Administered 2020-03-10: 10 mg via ORAL
  Filled 2020-03-10: qty 2

## 2020-03-10 MED ORDER — LORAZEPAM 2 MG/ML IJ SOLN
INTRAMUSCULAR | Status: AC
  Filled 2020-03-10: qty 1

## 2020-03-10 MED ORDER — DIPHENHYDRAMINE HCL 50 MG/ML IJ SOLN
INTRAMUSCULAR | Status: AC
  Filled 2020-03-10: qty 1

## 2020-03-10 MED ORDER — HALOPERIDOL LACTATE 5 MG/ML IJ SOLN
INTRAMUSCULAR | Status: AC
  Filled 2020-03-10: qty 1
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
## 2020-03-10 NOTE — ED Triage Notes (Signed)
Pt bib gpd with paranoia. Pt states Jesus is out to get her. Disorganized thoughts. Denies HI/SI.
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
## 2020-03-10 NOTE — [facility] Counselor (Signed)
Staffed case with Denzil Magnuson, NP and Dr. Lucianne Muss.  They have requested that we reach out to [facility] to determine patient's treatment compliance.  Per Milagros Loll, Intake Coordinator, patient has been compliant with medications, as she doses daily at their office and has CST staff administering evening medications.  She also received her [place] long acting injection last week, as scheduled.  The only missed dose has been this morning, as patient was having a rough morning and didn't want to get out of the transport van to come in the office to receive medications.  Per Dr. Lucianne Muss, patient does not meet criteria for inpatient treatment at this time, as overall she has been medication compliant.  The recommendation is that she follow up with [facility] for continued outpatient treatment.  LPC informed Milagros Loll of the recommendation and their CST staff, Chrissie Noa, who has good rapport with patient, will pick her up at [facility] ED soon.

## 2020-03-10 NOTE — ED Notes (Signed)
Per EDP, Pt appropriate for discharge. Patient verbalizes understanding of discharge instructions. Opportunity for questioning and answers were provided. Armband removed by staff, pt discharged from ED ambulatory to home with peer support.
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
## 2020-03-10 NOTE — ED Notes (Signed)
IVC paperwork on hold for now. Pt has calmed and is cooperating with other nurse. [facility] aware of pt, very familiar with her, and working on plan. They will keep staff updated.

## 2020-03-20 ENCOUNTER — Observation Stay (HOSPITAL_COMMUNITY)
Admission: RE | Admit: 2020-03-20 | Discharge: 2020-03-21 | Disposition: A | Discharge status: Home / Self Care | Payer: Medicare Other | Attending: Psychiatry | Admitting: Psychiatry

## 2020-03-20 DIAGNOSIS — F69 Unspecified disorder of adult personality and behavior: Secondary | ICD-10-CM | POA: Diagnosis not present

## 2020-03-20 DIAGNOSIS — Z20822 Contact with and (suspected) exposure to covid-19: Secondary | ICD-10-CM | POA: Insufficient documentation

## 2020-03-20 DIAGNOSIS — E161 Other hypoglycemia: Secondary | ICD-10-CM | POA: Diagnosis not present

## 2020-03-20 DIAGNOSIS — E162 Hypoglycemia, unspecified: Secondary | ICD-10-CM | POA: Diagnosis not present

## 2020-03-20 DIAGNOSIS — F22 Delusional disorders: Secondary | ICD-10-CM | POA: Diagnosis not present

## 2020-03-20 DIAGNOSIS — F200 Paranoid schizophrenia: Secondary | ICD-10-CM | POA: Diagnosis not present

## 2020-03-20 DIAGNOSIS — F29 Unspecified psychosis not due to a substance or known physiological condition: Secondary | ICD-10-CM | POA: Diagnosis not present

## 2020-03-20 LAB — SARS CORONAVIRUS 2 BY RT PCR (HOSPITAL ORDER, PERFORMED IN ~~LOC~~ HOSPITAL LAB): SARS Coronavirus 2: NEGATIVE

## 2020-03-20 MED ORDER — SERTRALINE HCL 50 MG PO TABS
50.0000 mg | ORAL_TABLET | Freq: Every day | ORAL | Status: DC
  Administered 2020-03-20 – 2020-03-21 (×2): 50 mg via ORAL
  Filled 2020-03-20 (×2): qty 1

## 2020-03-20 MED ORDER — RISPERIDONE 2 MG PO TABS
4.0000 mg | ORAL_TABLET | Freq: Two times a day (BID) | ORAL | Status: DC
  Administered 2020-03-20 – 2020-03-21 (×2): 4 mg via ORAL
  Filled 2020-03-20 (×2): qty 2

## 2020-03-20 MED ORDER — ONDANSETRON 4 MG PO TBDP: 4.0000 mg | ORAL_TABLET | Freq: Three times a day (TID) | ORAL | Status: DC | PRN

## 2020-03-20 MED ORDER — BENZTROPINE MESYLATE 1 MG PO TABS
1.0000 mg | ORAL_TABLET | Freq: Two times a day (BID) | ORAL | Status: DC
  Administered 2020-03-20 – 2020-03-21 (×2): 1 mg via ORAL
  Filled 2020-03-20: qty 1

## 2020-03-20 MED ORDER — TRAZODONE HCL 50 MG PO TABS
150.0000 mg | ORAL_TABLET | Freq: Every evening | ORAL | Status: DC | PRN
  Administered 2020-03-20: 150 mg via ORAL
  Filled 2020-03-20: qty 1

## 2020-03-20 MED ORDER — DICYCLOMINE HCL 20 MG PO TABS
20.0000 mg | ORAL_TABLET | Freq: Two times a day (BID) | ORAL | Status: DC
  Administered 2020-03-21: 20 mg via ORAL
  Filled 2020-03-20 (×2): qty 1

## 2020-03-20 MED ORDER — ALUM & MAG HYDROXIDE-SIMETH 200-200-20 MG/5ML PO SUSP: 30.0000 mL | ORAL | Status: DC | PRN

## 2020-03-20 MED ORDER — MAGNESIUM HYDROXIDE 400 MG/5ML PO SUSP: 30.0000 mL | Freq: Every day | ORAL | Status: DC | PRN

## 2020-03-20 MED ORDER — ACETAMINOPHEN 325 MG PO TABS: 650.0000 mg | ORAL_TABLET | Freq: Four times a day (QID) | ORAL | Status: DC | PRN

## 2020-03-20 NOTE — BH Assessment (Addendum)
Assessment Note  [name] [name] is an 44 y.o. female that presents this date voluntarily to [facility] for assessment via [facility]. Patient appears guarded but answers questions appropriately when prompted. Patient denies any S/I, H/I or AVH. Patient states her home health care nurse did not bring her medications to her earlier this date and she has been without them for three days now. Patient reports "death is after her" and "she needs to hide here." Patient speaks in a low soft voice and renders limited information on arrival. Patient has a history of schizophrenia and numerous prior psychiatric hospitalizations. Patient has had multiple past admissions to [facility] for similar presentations and was last seen on 03/10/20 when she presented with similar symptoms. Patient's baseline has been fixed delusions and they are chronic for this patient. She is currently followed by [facility] and states she takes her medications as prescribed when she has them.  She can list current medications to include: Zoloft, Risperdal, Cogentin and Trazodone. She is also prescribed Gean Birchwood and states she received her dose as scheduled last week.  Patient does appear paranoid and MHT reports patient has been talking to herself while in waiting area. This appears to be patient's baseline.  Patient is alert and oriented x 4. She is dressed appropriately and presents with a flat affect. Patient's speech is delayed slow and soft. Her eye contact is poor, and she appears to be experiencing some active thought blocking. Her mood is guarded and she appears to have limited insight but fair judgement and impulse control. Patient appears to be responding to internal stimuli as evidenced by MHT's report above. Patient appears delusional. Case was staffed with Shaune Pollack DNP who also evlauted patient and recommends patient be evaluated for possible medication interventions and be observed and monitored.      Diagnosis: F25.0 Schizoaffective D/O    Past Medical History:  Past Medical History:  Diagnosis Date  . Arthritis   . Depression   . Hallucinations   . Paranoid schizophrenia (HCC)   . Schizoaffective disorder (HCC)     No past surgical history on file.  Family History: No family history on file.  Social History:  reports that she has never smoked. She has never used smokeless tobacco. She reports previous alcohol use. She reports previous drug use.  Additional Social History:  Alcohol / Drug Use Pain Medications: See MAR Prescriptions: See MAR Over the Counter: See MAR History of alcohol / drug use?: No history of alcohol / drug abuse  CIWA:   COWS:    Allergies: No Known Allergies  Home Medications:  No medications prior to admission.    OB/GYN Status:  No LMP recorded.  General Assessment Data Location of Assessment: [facility] Assessment Services TTS Assessment: In system Is this a Tele or Face-to-Face Assessment?: Face-to-Face Is this an Initial Assessment or a Re-assessment for this encounter?: Initial Assessment Patient Accompanied by:: N/A Language Other than English: No Living Arrangements: Other (Comment)(Private home) What gender do you identify as?: Female Marital status: Single Living Arrangements: Alone Can pt return to current living arrangement?: Yes Admission Status: Voluntary Is patient capable of signing voluntary admission?: Yes Referral Source: Self/Family/Friend Insurance type: [facility]     Crisis Care Plan Living Arrangements: Alone Legal Guardian: Other:(Self) Name of Psychiatrist: [facility] Name of Therapist: [facility]   Education Status Is patient currently in school?: No Is the patient employed, unemployed or receiving disability?: Receiving disability income  Risk to self with the past 6 months  Suicidal Ideation: No Has patient been a risk to self within the past 6 months prior to admission? : No Suicidal Intent: No Has patient had any suicidal intent within  the past 6 months prior to admission? : No Is patient at risk for suicide?: No Suicidal Plan?: No Has patient had any suicidal plan within the past 6 months prior to admission? : No Access to Means: No What has been your use of drugs/alcohol within the last 12 months?: None Previous Attempts/Gestures: No How many times?: 3 Other Self Harm Risks: (Ongoing MH issues) Triggers for Past Attempts: Unknown Intentional Self Injurious Behavior: None Family Suicide History: No Recent stressful life event(s): Other (Comment)(Medication issues) Persecutory voices/beliefs?: No Depression: No Depression Symptoms: (Denies) Substance abuse history and/or treatment for substance abuse?: No Suicide prevention information given to non-admitted patients: Not applicable  Risk to Others within the past 6 months Homicidal Ideation: No Does patient have any lifetime risk of violence toward others beyond the six months prior to admission? : No Thoughts of Harm to Others: No Current Homicidal Intent: No Current Homicidal Plan: No Access to Homicidal Means: No Identified Victim: NA History of harm to others?: No Assessment of Violence: None Noted Violent Behavior Description: NA Does patient have access to weapons?: No Criminal Charges Pending?: No Does patient have a court date: No Is patient on probation?: No  Psychosis Hallucinations: None noted Delusions: Persecutory  Mental Status Report Appearance/Hygiene: Unremarkable Eye Contact: Fair Motor Activity: Freedom of movement Speech: Soft, Slow Level of Consciousness: Quiet/awake Mood: Preoccupied Affect: Flat Anxiety Level: Minimal Thought Processes: Thought Blocking Judgement: Impaired Orientation: Person, Place, Time Obsessive Compulsive Thoughts/Behaviors: None  Cognitive Functioning Concentration: Decreased Memory: Recent Intact, Remote Intact Is patient IDD: No Insight: Poor Impulse Control: Poor Appetite: Good Have you had  any weight changes? : No Change Sleep: No Change Total Hours of Sleep: 6 Vegetative Symptoms: None  ADLScreening [facility] Assessment Services) Patient's cognitive ability adequate to safely complete daily activities?: Yes Patient able to express need for assistance with ADLs?: Yes Independently performs ADLs?: Yes (appropriate for developmental age)  Prior Inpatient Therapy Prior Inpatient Therapy: Yes Prior Therapy Dates: Multiple Prior Therapy Facilty/Provider(s): [facility], [place] Reason for Treatment: MH issues  Prior Outpatient Therapy Prior Outpatient Therapy: Yes Prior Therapy Dates: Ongoing Prior Therapy Facilty/Provider(s): [facility] Reason for Treatment: Med mang Does patient have an ACCT team?: No Does patient have Intensive In-House Services?  : No Does patient have [facility] services? : No Does patient have P4CC services?: No  ADL Screening (condition at time of admission) Patient's cognitive ability adequate to safely complete daily activities?: Yes Is the patient deaf or have difficulty hearing?: No Does the patient have difficulty seeing, even when wearing glasses/contacts?: No Does the patient have difficulty concentrating, remembering, or making decisions?: No Patient able to express need for assistance with ADLs?: Yes Does the patient have difficulty dressing or bathing?: No Independently performs ADLs?: Yes (appropriate for developmental age) Does the patient have difficulty walking or climbing stairs?: No Weakness of Legs: None Weakness of Arms/Hands: None  Home Assistive Devices/Equipment Home Assistive Devices/Equipment: None  Therapy Consults (therapy consults require a physician order) PT Evaluation Needed: No OT Evalulation Needed: No SLP Evaluation Needed: No Abuse/Neglect Assessment (Assessment to be complete while patient is alone) Physical Abuse: Denies Verbal Abuse: Denies Sexual Abuse: Denies Exploitation of patient/patient's  resources: Denies Self-Neglect: Denies Values / Beliefs Cultural Requests During Hospitalization: None Spiritual Requests During Hospitalization: None Consults Spiritual Care Consult  Needed: No Transition of Care Team Consult Needed: No Advance Directives (For Healthcare) Does Patient Have a Medical Advance Directive?: No Would patient like information on creating a medical advance directive?: No - Patient declined          Disposition: Case was staffed with Shaune Pollack DNP who also evlauted patient and recommends patient be evaluated for possible medication interventions and be observed and monitored.  Disposition Initial Assessment Completed for this Encounter: Yes Disposition of Patient: (Observe and monitor)  On Site Evaluation by:   Reviewed with Physician:    Alfredia Ferguson 03/20/2020 6:05 PM

## 2020-03-20 NOTE — Progress Notes (Signed)
Patient ID: [name] [name], female   DOB: 1975-12-03, 44 y.o.   MRN: [id number]                            [facility] Observation Crisis Plan  Reason for Crisis Plan:  Chronic Mental Illness/Medical Illness, Crisis Stabilization and Medication Management   Plan of Care:  Referral for Telepsychiatry/Psychiatric Consult  Family Support:      Current Living Environment:  Living Arrangements: Alone  Insurance:   Hospital Account    Name Acct ID Class Status Primary Coverage   Raynie, Steinhaus [id number] BEHAVIORAL HEALTH OBSERVATION Open MEDICARE - MEDICARE PART A AND B        Guarantor Account (for Hospital Account [id number])    Name Relation to Pt Service Area Active? Acct Type   [name] Self [facility] Yes Behavioral Health   Address Phone       [address] [place], [place] [address] [phone](H)          Coverage Information (for Hospital Account [id number])    1. MEDICARE/MEDICARE PART A AND B    F/O Payor/Plan Precert #   MEDICARE/MEDICARE PART A AND B    Subscriber Subscriber #   [place], [place] [id number]   Address Phone   [address] [place], [place] [address]        2. [place]    F/O Payor/Plan Precert #   [facility] MEDICAID/[place]    Subscriber Subscriber #   Pam, [place] [id number]   Address Phone   [address] [place], [place] [address] [phone]          Legal Guardian:  Legal Guardian: Other:(Self)  Primary Care Provider:  Patient, No Pcp Per  Current Outpatient Providers:  [facility]  Psychiatrist:  Name of Psychiatrist: [facility]  Counselor/Therapist:  Name of Therapist: [facility]   Compliant with Medications:  No   Additional Information: n/a   Tania Ade 5/15/20217:22 PM

## 2020-03-20 NOTE — BH Assessment (Signed)
[facility] Assessment Progress Note Case was staffed with Shaune Pollack DNP who also evaluated patient and recommends patient be evaluated for possible medication interventions and be observed and monitored.

## 2020-03-20 NOTE — Progress Notes (Signed)
Patient ID: [name] [name], female   DOB: [birth year], 44 y.o.   MRN: [id number]   Pt alert and oriented on the unit. Pt denies SI/HI, and AH, but endorses VH. Pt is observed and heard responding to internal stimuli. Pt anxious and preoccupied stating, "I want to get my medicine right." Pt is cooperative. COVID test complete. Education, support, and encouragement provided, q15 minute safety checks remain in effect. Pt denies any concerns at this time, and verbally contracts for safety. Pt ambulating on the unit with no issues. Pt remains safe on the unit.

## 2020-03-21 DIAGNOSIS — Z20822 Contact with and (suspected) exposure to covid-19: Secondary | ICD-10-CM | POA: Diagnosis not present

## 2020-03-21 DIAGNOSIS — F200 Paranoid schizophrenia: Secondary | ICD-10-CM | POA: Diagnosis not present

## 2020-03-21 NOTE — Progress Notes (Signed)
   03/21/20 0900  Psych Admission Type (Psych Patients Only)  Admission Status Voluntary  Psychosocial Assessment  Patient Complaints Worrying  Eye Contact Fair  Facial Expression Flat  Affect Flat  Speech Soft  Interaction Minimal  Motor Activity Fidgety  Appearance/Hygiene Unremarkable  Behavior Characteristics Cooperative  Mood Preoccupied  Thought Process  Coherency Tangential  Content Preoccupation  Delusions None reported or observed  Perception Hallucinations  Hallucination Auditory  Judgment Impaired  Confusion None  Danger to Self  Current suicidal ideation? Denies  Danger to Others  Danger to Others None reported or observed

## 2020-03-21 NOTE — Discharge Instructions (Signed)
Schizophrenia Schizophrenia is a mental illness. It may cause disturbed or disorganized thinking, speech, or behavior. People with schizophrenia have problems functioning in one or more areas of life. People with schizophrenia are at increased risk for suicide, certain long-term (chronic) physical illnesses, and unhealthy behaviors, such as smoking and drug use. People who have family members with schizophrenia are at higher risk of developing the illness. Schizophrenia affects men and women equally, but it usually appears at an earlier age (teenage or early adult years) in men. What are the causes? The cause of this condition is not known. What increases the risk? The following factors may make you more likely to develop this condition:  Having a family member who has schizophrenia. Some gene combinations may increase the risk, but there is no single gene that causes schizophrenia.  Impaired brain or neurotransmitter development or chemistry. Neurotransmitters are chemicals in the brain. What are the signs or symptoms? The earliest symptoms are often subtle and may go unnoticed until the illness becomes more severe (first-break psychosis). Symptoms of schizophrenia may be ongoing (continuous) or may come and go in severity. Episodes are often triggered by major life events, such as:  Family stress.  College.  Military service.  Marriage.  Pregnancy or childbirth.  Divorce.  Loss of a loved one. Symptoms may include:  Seeing, hearing, or feeling things that do not exist (hallucinations).  Having false beliefs (delusions). Delusions often involve beliefs that you are being attacked, harassed, cheated, persecuted, or conspired against (persecutory delusions).  Speech that does not make sense to others or is hard to understand (incoherent).  Behavior that is odd, confused, unfocused, withdrawn, or disorganized.  Extremely overactive or underactive motor activity (catatonia). Motor  activity is any action that involves the muscles.  Bland or blunted emotions (flat affect).  Loss of will power (avolition).  Withdrawal from social contacts (social isolation). Symptoms may affect the level of functioning in one or more major areas of life, such as work, school, relationships, or self-care. How is this diagnosed? Schizophrenia is diagnosed through an assessment by a mental health care provider.  Your mental health care provider may ask questions about: ? Your thoughts, behavior, and mood. ? Your ability to function in daily life. ? Your medical history. ? Any use of alcohol or drugs, including prescription medicines.  You may have blood tests and imaging exams. How is this treated? Schizophrenia is a chronic illness that is best controlled with continuous treatment rather than treatment only when symptoms occur. The following treatments are used to manage schizophrenia:  Medicine. This is the most effective and important form of treatment for schizophrenia. Antipsychotic medicines are usually prescribed to help manage schizophrenia. Other types of medicine may be added to relieve any symptoms that may occur despite the use of antipsychotic medicines.  Counseling or talk therapy. Individual, group, or family counseling may be helpful in providing education, support, and guidance. Many people also benefit from social skills and job skills (vocational) training. A combination of medicine and counseling is best for managing the disorder over time. A procedure in which electricity is applied to the brain through the scalp (electroconvulsive therapy) may be used to treat catatonic schizophrenia or schizophrenia in people who cannot take medicine or do not respond to medicine and counseling. Follow these instructions at home:   Keep stress under control. Stress may trigger psychosis and make symptoms worse.  Try to get as much sleep as you can.  Avoid alcohol and drugs.    They can affect how medicine works and make symptoms worse.  Surround yourself with people who care about you and can help you manage your condition.  Take over-the-counter and prescription medicines only as told by your health care provider.  Keep all follow-up visits as told by your health care provider and counselor. This is important. Contact a health care provider if:  You have a bad response to changes in medicines or to your treatment plan.  You have trouble falling sleep.  You have a low mood that will not go away.  You are using: ? Drugs. ? Too much caffeine. ? Tobacco products. ? Alcohol. Get help right away if:  You feel out of control.  You or others notice warning signs of suicide such as: ? Increased use of drugs or alcohol ? Expressing feelings of not having a purpose in life, being trapped, guilty, anxious and agitated, or hopeless. ? Withdrawing from friends and family. ? Showing uncontrolled anger, recklessness, and dramatic mood changes. ? Talking about suicide, discussing or searching for methods. If you ever feel like you may hurt yourself or others, or have thoughts about taking your own life, get help right away. You can go to your nearest emergency department or call:  Your local emergency services (911 in the [place]  A suicide crisis helpline, such as the National Suicide Prevention Lifeline at 1-800-273-8255. This is open 24 hours a day. Summary  Schizophrenia is a mental illness that causes disturbed or disorganized thinking, speech, or behavior.  Symptoms of schizophrenia may be ongoing or may come and go. They are often triggered by major life events.  Keep stress under control. Stress may trigger psychosis and make symptoms worse.  Avoid alcohol and drugs. They can affect how medicine works and make symptoms worse.  Get help right away if you feel out of control. This information is not intended to replace advice given to you by your health  care provider. Make sure you discuss any questions you have with your health care provider. Document Revised: 10/05/2017 Document Reviewed: 08/04/2016 Elsevier Patient Education  2020 Elsevier Inc.  

## 2020-03-21 NOTE — [facility] Suicide Risk Assessment (Cosign Needed Addendum)
Suicide Risk Assessment    [facility] Discharge Suicide Risk Assessment   Principal Problem: Schizophrenia, paranoid type [facility]) Discharge Diagnoses: Principal Problem:   Schizophrenia, paranoid type [facility])  Patient seen and evaluated in person by this provider.  She presented as a walk-in yesterday for not having medications delivered by her services.  She was also voicing paranoia in worried people were after her.  Requested to restart her medications to stabilize and consider discharge in the morning.  Her medications were restarted and client stabilized.  Yesterday she also reported that she was easily agitated from people "talking about her".  Today she denies any paranoia and plans to follow-up with her ACT team.  Reports living alone and feels safe to return home.  Psychiatrically cleared for discharge.  Dr. Jannifer Franklin reviewed this client and concurs with the treatment plan.  Total Time spent with patient: 45 minutes  Musculoskeletal: Strength & Muscle Tone: within normal limits Gait & Station: normal Patient leans: N/A  Psychiatric Specialty Exam:   There were no vitals taken for this visit.There is no height or weight on file to calculate BMI.  General Appearance: Casual  Eye Contact::  Good  Speech:  Normal Rate409  Volume:  Normal  Mood:  Euthymic  Affect:  Blunt  Thought Process:  Coherent and Descriptions of Associations: Intact  Orientation:  Full (Time, Place, and Person)  Thought Content:  WDL and Logical  Suicidal Thoughts:  No  Homicidal Thoughts:  No  Memory:  Immediate;   Good Recent;   Good Remote;   Good  Judgement:  Fair  Insight:  Fair  Psychomotor Activity:  Normal  Concentration:  Good  Recall:  Good  Fund of Knowledge:Good  Language: Good  Akathisia:  No  Handed:  Right  AIMS (if indicated):     Assets:  Housing Leisure Time Physical Health Resilience Social Support  Sleep:     Cognition: WNL  ADL's:  Intact   Mental Status Per Nursing  Assessment::   On Admission:  NA  Demographic Factors:  Living alone  Loss Factors: NA  Historical Factors: NA  Risk Reduction Factors:   Sense of responsibility to family, Positive social support and Positive therapeutic relationship  Continued Clinical Symptoms:  None  Cognitive Features That Contribute To Risk:  None    Suicide Risk:  Minimal: No identifiable suicidal ideation.  Patients presenting with no risk factors but with morbid ruminations; may be classified as minimal risk based on the severity of the depressive symptoms   Plan Of Care/Follow-up recommendations:  Schizophrenia, paranoid type: -continued Risperdal 4 mg BID -Continue with ACT team via [facility]  Depression: -Continued Zoloft 50 mg dailly  EPS: -Continued Cogentin 1 mg BID  Insomnia: -Continued Trazodone 150 mg at bedtime PRN Activity:  as tolerated Diet:  heart healthy diet  Nanine Means, NP 03/21/2020, 4:45 PM  Patient seen face-to-face for psychiatric evaluation, chart reviewed and case discussed with the physician extender and developed treatment plan. Reviewed the information documented and agree with the treatment plan. Thedore Mins, MD

## 2020-03-21 NOTE — Progress Notes (Signed)
Patient ID: [name] [name], female   DOB: 01/14/1976, 44 y.o.   MRN: [id number]   D: Pt alert and oriented on the unit.   A: Education, support, and encouragement provided. Discharge summary, medications and follow up appointments reviewed with pt. Suicide prevention resources provided and belongings in [place].  R: Pt denies SI/HI, AH, and pain, or any concerns at this time. Pt ambulatory on and off unit. Pt discharged to lobby.

## 2020-03-21 NOTE — Progress Notes (Signed)
Pt did not endorse SI/HI to this nurse. Appeared to be responding to internal stimuli during med pass, talking to self. Ambulated to TV room after COVID result came back negative. Took all meds except Cogentin, stating she did not take that. Pt slept throughout the night with no complaints. Respirations even and unlabored during hours of sleep. Safety maintained.

## 2020-03-23 DIAGNOSIS — Z03818 Encounter for observation for suspected exposure to other biological agents ruled out: Secondary | ICD-10-CM | POA: Diagnosis not present

## 2020-03-24 ENCOUNTER — Ambulatory Visit (HOSPITAL_COMMUNITY)
Admission: AD | Admit: 2020-03-24 | Discharge: 2020-03-24 | Disposition: A | Discharge status: Home / Self Care | Payer: Medicare Other | Attending: Psychiatry | Admitting: Psychiatry

## 2020-03-24 DIAGNOSIS — F200 Paranoid schizophrenia: Secondary | ICD-10-CM | POA: Diagnosis present

## 2020-03-24 DIAGNOSIS — F22 Delusional disorders: Secondary | ICD-10-CM | POA: Diagnosis not present

## 2020-03-24 MED ORDER — BENZTROPINE MESYLATE 1 MG PO TABS
1.0000 mg | ORAL_TABLET | ORAL | Status: DC
  Filled 2020-03-24: qty 1

## 2020-03-24 MED ORDER — RISPERIDONE 2 MG PO TABS
4.0000 mg | ORAL_TABLET | ORAL | Status: DC
  Filled 2020-03-24: qty 2

## 2020-03-24 MED ORDER — TRAZODONE HCL 150 MG PO TABS
150.0000 mg | ORAL_TABLET | ORAL | Status: DC
  Filled 2020-03-24: qty 1

## 2020-03-24 NOTE — BH Assessment (Signed)
Assessment Note  [name] [name] is a 44 y.o. female who presented to [facility] on voluntary basis with complaint of missing her medication this morning and continued paranoia.  Pt lives alone in [place], and she receives outpatient treatment for Schizophrenia through [facility].  Pt has been assessed by TTS on numerous occasions.  Pt reported that she missed her medication this morning, and she also missed it this evening because she was concerned about being followed.  Pt stated that she is followed by [facility].  She stated that she wants to be left alone.  Pt's endorsement of paranoia is consistent with past presentation.  Pt denied suicidal ideation, homicidal ideation, hallucination, and self-injurious behavior.  Pt requested that she be given medication and money for an Benedetto Goad so she can go home.  During assessment, Pt presented as alert and oriented.  She had fair eye contact and was cooperative.  Pt was dressed in street clothes, and she appeared appropriately groomed.  Pt's mood was preoccupied (paranoid delusion), and affect was blunted.  Pt's speech was normal in rate, rhythm, and volume.  Thought processes were within normal range, and thought content was coherent; paranoid ideation suggested delusion.  Pt's memory and concentration were fair.  Pt's insight was poor.  Judgment and impulse control were fair.  Consulted with S. Rankin, NP, who also spoke with Pt.  Attending NP ordered medication, and author provided bus pass.  Pt is psych-cleared.  Diagnosis: Schizophrenia  Past Medical History:  Past Medical History:  Diagnosis Date  . Arthritis   . Depression   . Hallucinations   . Paranoid schizophrenia (HCC)   . Schizoaffective disorder (HCC)     No past surgical history on file.  Family History: No family history on file.  Social History:  reports that she has never smoked. She has never used smokeless tobacco. She reports previous alcohol use. She reports previous drug  use.  Additional Social History:  Alcohol / Drug Use Pain Medications: See MAR Prescriptions: See MAR Over the Counter: See MAR  CIWA: CIWA-Ar BP: 122/77 Pulse Rate: (!) 101([employer] Designer, television/film set was notified) COWS:    Allergies: No Known Allergies  Home Medications: (Not in a hospital admission)   OB/GYN Status:  No LMP recorded.  General Assessment Data Location of Assessment: [facility] Assessment Services TTS Assessment: In system Is this a Tele or Face-to-Face Assessment?: Face-to-Face Is this an Initial Assessment or a Re-assessment for this encounter?: Initial Assessment Patient Accompanied by:: N/A Language Other than English: No Living Arrangements: Other (Comment)(Single family home) What gender do you identify as?: Female Marital status: Single Maiden name: Dolecki Pregnancy Status: No Living Arrangements: Alone Can pt return to current living arrangement?: Yes Admission Status: Voluntary Is patient capable of signing voluntary admission?: Yes Referral Source: Self/Family/Friend Insurance type: [facility] MCR     Crisis Care Plan Living Arrangements: Alone Legal Guardian: Other:(Self) Name of Psychiatrist: [facility] Name of Therapist: [facility]   Education Status Is patient currently in school?: No Is the patient employed, unemployed or receiving disability?: Receiving disability income  Risk to self with the past 6 months Suicidal Ideation: No Has patient been a risk to self within the past 6 months prior to admission? : No Suicidal Intent: No Has patient had any suicidal intent within the past 6 months prior to admission? : No Is patient at risk for suicide?: No Suicidal Plan?: No Has patient had any suicidal plan within the past 6 months prior to admission? :  No Access to Means: No What has been your use of drugs/alcohol within the last 12 months?: None Previous Attempts/Gestures: No Triggers for Past Attempts: Unknown Intentional Self Injurious  Behavior: None Family Suicide History: No Recent stressful life event(s): Other (Comment)(Missed medication this AM) Persecutory voices/beliefs?: No Depression: No Depression Symptoms: Despondent, Isolating Substance abuse history and/or treatment for substance abuse?: No Suicide prevention information given to non-admitted patients: Not applicable  Risk to Others within the past 6 months Homicidal Ideation: No Does patient have any lifetime risk of violence toward others beyond the six months prior to admission? : No Thoughts of Harm to Others: No Current Homicidal Intent: No Current Homicidal Plan: No Access to Homicidal Means: No History of harm to others?: No Assessment of Violence: None Noted Does patient have access to weapons?: No Criminal Charges Pending?: No Does patient have a court date: No Is patient on probation?: No  Psychosis Hallucinations: None noted Delusions: Persecutory  Mental Status Report Appearance/Hygiene: Unremarkable Eye Contact: Good Motor Activity: Freedom of movement, Unremarkable Speech: Logical/coherent Level of Consciousness: Alert Mood: Preoccupied Affect: Blunted Anxiety Level: Moderate Thought Processes: Coherent Judgement: Impaired Orientation: Person, Place, Time, Situation Obsessive Compulsive Thoughts/Behaviors: None  Cognitive Functioning Concentration: Fair Memory: Remote Intact, Recent Intact Is patient IDD: No Insight: Poor Impulse Control: Poor Appetite: Good Have you had any weight changes? : No Change Sleep: No Change Total Hours of Sleep: 6 Vegetative Symptoms: None  ADLScreening [facility] Assessment Services) Patient's cognitive ability adequate to safely complete daily activities?: Yes Patient able to express need for assistance with ADLs?: Yes Independently performs ADLs?: Yes (appropriate for developmental age)  Prior Inpatient Therapy Prior Inpatient Therapy: Yes Prior Therapy Dates: Multiple Prior Therapy  Facilty/Provider(s): [facility], [facility] Reason for Treatment: Schizophrenia, paranoid type  Prior Outpatient Therapy Prior Outpatient Therapy: Yes Prior Therapy Dates: Ongoing Prior Therapy Facilty/Provider(s): [facility] Reason for Treatment: Med mang Does patient have an ACCT team?: No Does patient have Intensive In-House Services?  : No Does patient have [facility] services? : No Does patient have P4CC services?: No  ADL Screening (condition at time of admission) Patient's cognitive ability adequate to safely complete daily activities?: Yes Is the patient deaf or have difficulty hearing?: No Does the patient have difficulty seeing, even when wearing glasses/contacts?: No Does the patient have difficulty concentrating, remembering, or making decisions?: No Patient able to express need for assistance with ADLs?: Yes Does the patient have difficulty dressing or bathing?: No Independently performs ADLs?: Yes (appropriate for developmental age) Does the patient have difficulty walking or climbing stairs?: No Weakness of Legs: None Weakness of Arms/Hands: None  Home Assistive Devices/Equipment Home Assistive Devices/Equipment: None  Therapy Consults (therapy consults require a physician order) PT Evaluation Needed: No OT Evalulation Needed: No SLP Evaluation Needed: No     Consults Spiritual Care Consult Needed: No Transition of Care Team Consult Needed: No Advance Directives (For Healthcare) Does Patient Have a Medical Advance Directive?: No          Disposition:  Disposition Initial Assessment Completed for this Encounter: Yes Disposition of Patient: Discharge(Per S. Rankin, NP-- see notes)  On Site Evaluation by:   Reviewed with Physician:    Laurena Slimmer Sheana Bir 03/24/2020 6:39 PM

## 2020-03-24 NOTE — H&P (Signed)
Behavioral Health Medical Screening Exam  [name] [name] is an 44 y.o. female patient presented to [facility] as walk in; brought in voluntary by police.  Patient left and then came back stating she wanted an [place] home.  Patient reporting paranoia and missing her morning and evening dose of medications.  Patient denies suicidal/self-harm/homicidal ideation, auditory/visual hallucinations.  Patient does present with paranoia which is her baseline that Jesus is following her.  Patient states that she is not afraid that she just wants to go home and doesn't want to walk.  Patient informed that I could give her a bus ticket but no one was here to order her an Melburn Popper unless she could pay for it.  States that the bus is just around the corner from her house but doesn't want to walk "but I guess I can if I have to; I don't want to pay for Melburn Popper."  Patient was given her even medications (Risperdal, Cogentin, and Trazodone).  States that her ACT team sees her twice daily 7 days a week.    Total Time spent with patient: 30 minutes  Psychiatric Specialty Exam: Physical Exam  Vitals reviewed. Constitutional: She is oriented to person, place, and time. She appears well-developed and well-nourished. No distress.  Respiratory: Effort normal.  Musculoskeletal:        General: Normal range of motion.     Cervical back: Normal range of motion.  Neurological: She is alert and oriented to person, place, and time.  Skin: Skin is warm and dry.  Psychiatric: Her speech is normal and behavior is normal. Her affect is labile. Thought content is paranoid (Baseline). Cognition and memory are normal. She expresses impulsivity.    Review of Systems  Psychiatric/Behavioral: Negative for self-injury, sleep disturbance and suicidal ideas. Hallucinations: Denies. Nervous/anxious: Stable.        Patient reporting that she missed her morning dose of medication "I was at [facility] and didn't make it home in time because somebody was  following me."  Patient also states she missed her evening medication because she wasn't home.    All other systems reviewed and are negative.   Blood pressure 122/77, pulse (!) 101, temperature 98.4 F (36.9 C), temperature source Oral, resp. rate 16.There is no height or weight on file to calculate BMI.  General Appearance: Casual  Eye Contact:  Good  Speech:  Clear and Coherent and Normal Rate  Volume:  Normal  Mood:  "Fine"  Affect:  Appropriate and Congruent  Thought Process:  Coherent, Goal Directed and Descriptions of Associations: Intact  Orientation:  Full (Time, Place, and Person)  Thought Content:  Paranoid Ideation  Suicidal Thoughts:  No  Homicidal Thoughts:  No  Memory:  Immediate;   Good Recent;   Good  Judgement:  Intact  Insight:  Present  Psychomotor Activity:  Normal  Concentration: Concentration: Good and Attention Span: Good  Recall:  Good  Fund of Knowledge:Fair  Language: Good  Akathisia:  No  Handed:  Right  AIMS (if indicated):     Assets:  Communication Skills Desire for Improvement Housing Leisure Time Social Support  Sleep:       Musculoskeletal: Strength & Muscle Tone: within normal limits Gait & Station: normal Patient leans: N/A  Blood pressure 122/77, pulse (!) 101, temperature 98.4 F (36.9 C), temperature source Oral, resp. rate 16.  Recommendations:  Follow up with ACT team and current psychiatric provider  Based on my evaluation the patient does not appear to have an  emergency medical condition.   Disposition:  Psychiatrically cleared No evidence of imminent risk to self or others at present.   Patient does not meet criteria for psychiatric inpatient admission. Supportive therapy provided about ongoing stressors. Discussed crisis plan, support from social network, calling 911, coming to the Emergency Department, and calling Suicide Hotline.  Hedi Barkan, NP 03/24/2020, 6:30 PM

## 2020-03-27 ENCOUNTER — Observation Stay (HOSPITAL_COMMUNITY)
Admission: AD | Admit: 2020-03-27 | Discharge: 2020-03-28 | Disposition: A | Discharge status: Home / Self Care | Payer: Medicare Other | Attending: Psychiatry | Admitting: Psychiatry

## 2020-03-27 DIAGNOSIS — Z79899 Other long term (current) drug therapy: Secondary | ICD-10-CM | POA: Insufficient documentation

## 2020-03-27 DIAGNOSIS — F200 Paranoid schizophrenia: Secondary | ICD-10-CM | POA: Diagnosis not present

## 2020-03-27 DIAGNOSIS — M1990 Unspecified osteoarthritis, unspecified site: Secondary | ICD-10-CM | POA: Diagnosis not present

## 2020-03-27 DIAGNOSIS — Z20822 Contact with and (suspected) exposure to covid-19: Secondary | ICD-10-CM | POA: Insufficient documentation

## 2020-03-27 LAB — SARS CORONAVIRUS 2 BY RT PCR (HOSPITAL ORDER, PERFORMED IN ~~LOC~~ HOSPITAL LAB): SARS Coronavirus 2: NEGATIVE

## 2020-03-27 MED ORDER — RISPERIDONE 2 MG PO TABS
2.0000 mg | ORAL_TABLET | Freq: Two times a day (BID) | ORAL | Status: DC
  Filled 2020-03-27: qty 1

## 2020-03-27 MED ORDER — MAGNESIUM HYDROXIDE 400 MG/5ML PO SUSP: 30.0000 mL | Freq: Every day | ORAL | Status: DC | PRN

## 2020-03-27 MED ORDER — TRAZODONE HCL 100 MG PO TABS
100.0000 mg | ORAL_TABLET | Freq: Every day | ORAL | Status: DC
  Administered 2020-03-27: 100 mg via ORAL
  Filled 2020-03-27: qty 1

## 2020-03-27 MED ORDER — OLANZAPINE 5 MG PO TBDP
5.0000 mg | ORAL_TABLET | Freq: Three times a day (TID) | ORAL | Status: DC | PRN
  Administered 2020-03-28: 5 mg via ORAL
  Filled 2020-03-27 (×2): qty 1

## 2020-03-27 MED ORDER — ALUM & MAG HYDROXIDE-SIMETH 200-200-20 MG/5ML PO SUSP
30.0000 mL | ORAL | Status: DC | PRN
  Administered 2020-03-28: 30 mL via ORAL
  Filled 2020-03-27: qty 30

## 2020-03-27 MED ORDER — MENTHOL 3 MG MT LOZG
1.0000 | LOZENGE | OROMUCOSAL | Status: DC | PRN
  Administered 2020-03-28: 3 mg via ORAL

## 2020-03-27 MED ORDER — ZIPRASIDONE MESYLATE 20 MG IM SOLR: 20.0000 mg | INTRAMUSCULAR | Status: DC | PRN

## 2020-03-27 MED ORDER — ACETAMINOPHEN 325 MG PO TABS: 650.0000 mg | ORAL_TABLET | Freq: Four times a day (QID) | ORAL | Status: DC | PRN

## 2020-03-27 MED ORDER — HYDROXYZINE HCL 25 MG PO TABS
25.0000 mg | ORAL_TABLET | Freq: Three times a day (TID) | ORAL | Status: DC | PRN
  Administered 2020-03-27: 25 mg via ORAL
  Filled 2020-03-27: qty 1

## 2020-03-27 MED ORDER — RISPERIDONE 2 MG PO TABS
ORAL_TABLET | ORAL | Status: AC
  Administered 2020-03-27: 2 mg via ORAL
  Filled 2020-03-27: qty 1

## 2020-03-27 MED ORDER — LORAZEPAM 1 MG PO TABS
1.0000 mg | ORAL_TABLET | ORAL | Status: AC | PRN
  Administered 2020-03-28: 1 mg via ORAL
  Filled 2020-03-27 (×2): qty 2

## 2020-03-27 NOTE — Progress Notes (Signed)
Patient ID: [name] [name], female   DOB: 09-13-1976, 44 y.o.   MRN: [id number]                           [facility] Observation Crisis Plan  Reason for Crisis Plan:  Chronic Mental Illness/Medical Illness, Crisis Stabilization and Medication Management   Plan of Care:  Referral for Telepsychiatry/Psychiatric Consult  Family Support:      Current Living Environment:  Living Arrangements: Alone  Insurance:   Hospital Account    Name Acct ID Class Status Primary Coverage   Albert, Devaul [id number] BEHAVIORAL HEALTH OBSERVATION Open MEDICARE - MEDICARE PART A AND B        Guarantor Account (for Hospital Account [id number])    Name Relation to Pt Service Area Active? Acct Type   [name] Self [facility] Yes Behavioral Health   Address Phone       [address] [place], [place] [address] [phone](H)          Coverage Information (for Hospital Account [id number])    1. MEDICARE/MEDICARE PART A AND B    F/O Payor/Plan Precert #   MEDICARE/MEDICARE PART A AND B    Subscriber Subscriber #   Londan, [place] [id number]   Address Phone   [address] [place], [place] [address]        2. [place]    F/O Payor/Plan Precert #   [facility] [place]ICAID/[place]    Subscriber Subscriber #   [place], [place] [id number]   Address Phone   [address] [place], [place] [address] [phone]          Legal Guardian:  Legal Guardian: Other:(self)  Primary Care Provider:  Patient, No Pcp Per  Current Outpatient Providers:  N/A  Psychiatrist:  Name of Psychiatrist: [facility]  Counselor/Therapist:  Name of Therapist: [facility]   Compliant with Medications:  No  Additional Information:   Tania Ade 5/22/20216:48 PM

## 2020-03-27 NOTE — BH Assessment (Signed)
Assessment Note  [name] [name] is an 44 y.o. female present [facility] as a walk-in stating, "I feel sick." Patient has mental health history of Paranoid Schizophrenia, Schizoaffective Disorder and hallucinations. She has been assessed by TTS on numerous occassions most recently 03/24/2020. Patient lives alone in [place], and and she receives outpatient treatment services at [facility]. Patient complains about not feeling safe. Patient's endorsement of paranoia is consistent with past presentation.  Pt denied suicidal ideation, homicidal ideation, hallucination, and self-injurious behavior.   During assessment, Pt presented as alert and oriented.  She had fair eye contact and was cooperative.  Pt was dressed in street clothes, and she appeared appropriately groomed.  Pt's mood was preoccupied (paranoid delusion), and affect was blunted.  Pt's speech was normal in rate, rhythm, and volume.  Thought processes were within normal range, and thought content was coherent; paranoid ideation suggested delusion.  Pt's memory and concentration were fair.  Pt's insight was poor.  Judgment and impulse control were fair.  Disposition: [name] Libra, NP, recommend overnight observation and attempt to get collateral.  Diagnosis:  . Depression  . Hallucinations  . Paranoid schizophrenia ([place])  . Schizoaffective disorder [facility])    Past Medical History:  Past Medical History:  Diagnosis Date  . Arthritis   . Depression   . Hallucinations   . Paranoid schizophrenia ([place])   . Schizoaffective disorder ([place])     No past surgical history on file.  Family History: No family history on file.  Social History:  reports that she has never smoked. She has never used smokeless tobacco. She reports previous alcohol use. She reports previous drug use.  Additional Social History:  Alcohol / Drug Use Pain Medications: see MAR Prescriptions: see MAR Over the Counter: see MAR History of alcohol / drug use?: No history of  alcohol / drug abuse  CIWA: CIWA-Ar BP: 134/85 Pulse Rate: (!) 102 COWS:    Allergies: No Known Allergies  Home Medications: (Not in a hospital admission)   OB/GYN Status:  No LMP recorded.  General Assessment Data Location of Assessment: [facility] Assessment Services(walk-in) TTS Assessment: In system Is this a Tele or Face-to-Face Assessment?: Face-to-Face Is this an Initial Assessment or a Re-assessment for this encounter?: Initial Assessment Patient Accompanied by:: N/A Language Other than English: No Living Arrangements: Other (Comment)(single family home) What gender do you identify as?: Female Marital status: Single Maiden name: Messamore Pregnancy Status: No Living Arrangements: Alone Can pt return to current living arrangement?: Yes Admission Status: Voluntary Is patient capable of signing voluntary admission?: Yes Referral Source: Self/Family/Friend Insurance type: [employer] MCR     Crisis Care Plan Living Arrangements: Alone Legal Guardian: Other:(self) Name of Psychiatrist: [facility] Name of Therapist: [facility]   Education Status Is patient currently in school?: No Is the patient employed, unemployed or receiving disability?: Receiving disability income  Risk to self with the past 6 months Suicidal Ideation: No Has patient been a risk to self within the past 6 months prior to admission? : No Suicidal Intent: No Has patient had any suicidal intent within the past 6 months prior to admission? : No Is patient at risk for suicide?: No Suicidal Plan?: No Has patient had any suicidal plan within the past 6 months prior to admission? : No Access to Means: No What has been your use of drugs/alcohol within the last 12 months?: None Previous Attempts/Gestures: No Triggers for Past Attempts: Unknown Intentional Self Injurious Behavior: None Family Suicide History: No Recent stressful life  event(s): Other (Comment)(unclear) Persecutory voices/beliefs?:  No Depression: No Depression Symptoms: Despondent, Isolating, Feeling worthless/self pity Substance abuse history and/or treatment for substance abuse?: No Suicide prevention information given to non-admitted patients: Not applicable  Risk to Others within the past 6 months Homicidal Ideation: No Does patient have any lifetime risk of violence toward others beyond the six months prior to admission? : No Thoughts of Harm to Others: No Current Homicidal Intent: No Current Homicidal Plan: No Access to Homicidal Means: No History of harm to others?: No Assessment of Violence: None Noted Does patient have access to weapons?: No Criminal Charges Pending?: No Does patient have a court date: No Is patient on probation?: No  Psychosis Hallucinations: None noted Delusions: Persecutory  Mental Status Report Appearance/Hygiene: Unremarkable Eye Contact: Good Motor Activity: Freedom of movement Speech: Logical/coherent Level of Consciousness: Alert Mood: Preoccupied Affect: Flat Anxiety Level: Minimal Thought Processes: Thought Blocking Judgement: Impaired Orientation: Person, Place, Time, Situation Obsessive Compulsive Thoughts/Behaviors: None  Cognitive Functioning Concentration: Decreased Memory: Recent Intact, Remote Intact Is patient IDD: No Insight: Poor Impulse Control: Poor Appetite: Good Have you had any weight changes? : No Change Sleep: No Change Total Hours of Sleep: 6 Vegetative Symptoms: None  ADLScreening [facility] Assessment Services) Patient's cognitive ability adequate to safely complete daily activities?: Yes Patient able to express need for assistance with ADLs?: Yes Independently performs ADLs?: Yes (appropriate for developmental age)  Prior Inpatient Therapy Prior Inpatient Therapy: Yes Prior Therapy Dates: Multiple Prior Therapy Facilty/Provider(s): [facility], [facility] Reason for Treatment: Schizophrenia, paranoid type  Prior Outpatient Therapy Prior  Outpatient Therapy: Yes Prior Therapy Dates: Ongoing Prior Therapy Facilty/Provider(s): [facility] Reason for Treatment: Med [facility] Does patient have an ACCT team?: No Does patient have Intensive In-House Services?  : No Does patient have [facility] services? : No Does patient have P4CC services?: No  ADL Screening (condition at time of admission) Patient's cognitive ability adequate to safely complete daily activities?: Yes Is the patient deaf or have difficulty hearing?: No Does the patient have difficulty seeing, even when wearing glasses/contacts?: No Does the patient have difficulty concentrating, remembering, or making decisions?: No Patient able to express need for assistance with ADLs?: Yes Does the patient have difficulty dressing or bathing?: No Independently performs ADLs?: Yes (appropriate for developmental age) Does the patient have difficulty walking or climbing stairs?: No       Abuse/Neglect Assessment (Assessment to be complete while patient is alone) Abuse/Neglect Assessment Can Be Completed: Unable to assess, patient is non-responsive or altered mental status     Advance Directives (For Healthcare) Does Patient Have a Medical Advance Directive?: No Would patient like information on creating a medical advance directive?: No - Patient declined          Disposition:  Disposition Initial Assessment Completed for this Encounter: Waverly Ferrari, NP, recommend overnight observation) Disposition of Patient: Berneice Heinrich, NP, recommend overnight observation)  On Site Evaluation by:   Reviewed with Physician:    Dian Situ 03/27/2020 1:55 PM

## 2020-03-27 NOTE — Progress Notes (Signed)
Spoke with patient's CST team member Maren Beach [phone] who verbalizes "I can speak to her on the phone but I cannot come in today." Per [place] patient receives medications from staff twice daily Monday through Friday. ALL Patient medications are managed by CST team, patient does not have medications in her home. Per [place], patient "does not want to take her mediations on weekends." Per [facility] patient did receive evening dose of medications on Friday 03/26/2020. Per [place] patient "has called the police several times this week."

## 2020-03-27 NOTE — Progress Notes (Addendum)
Patient watching TV in the dayroom.  Presents guarded with sullen affect and soft speech. ambulatory with a steady gait. Denies SI, HI, AVH but seen responding to internal stimuli. Pt denies and pain at this time. Complains of cough given Cepacol lozenge per MD order.  Pleasant and cooperative on approach. Compliant with HS medications. Denies any issues or concerns this evening.  Pt remains on Q 15 minutes safety checks and without self harm gestures. Writer encouraged pt to voice concerns. Pt remains safe on unit.

## 2020-03-27 NOTE — Progress Notes (Signed)
Patient ID: [name] [name], female   DOB: Dec 12, 1975, 44 y.o.   MRN: [id number]   Pt alert and oriented on the unit. Pt is voluntary and endorsing SI, AH, and is stating "[name] is trying to kill me. He's telling me things and keeps talking to me." COVID test complete. Education, support and encouragement provided, q15 minute safety checks started. Pt ambulating on the unit with no issues. Pt remains safe on the unit.

## 2020-03-27 NOTE — H&P (Addendum)
BH Observation Unit Provider Admission PAA/H&P  Patient Identification: [name] [name] MRN:  [id number] Date of Evaluation:  03/27/2020 Chief Complaint:  Paranoid schizophrenia (HCC) [F20.0] Principal Diagnosis: <principal problem not specified> Diagnosis:  Active Problems:   Paranoid schizophrenia (HCC)  History of Present Illness: Patient presents after calling police to transport to [facility] behavioral health.  Patient currently voluntarily at [facility] behavioral health for walk-in assessment. Patient reports fixed delusion that "Jesus is trying to kill me." Patient currently unable to contract verbally for safety's.  Patient states "Jesus tells me to do things and I am not sure that I can keep myself safe at home." Patient currently reports suicidal ideations, denies plan.  Patient denies homicidal ideations.  Patient denies auditory visual hallucinations.  Patient reports symptoms of paranoia states "somebody has been trying to kill me since I was a little girl." Patient reports CST team meets with her 5 days/week and brings her medications.  Patient reports compliance with medication. Patient reports she typically attends a day program but "they did not pick me up on Wednesday Thursday and Friday this week.". Patient contracts for safety while here at behavioral health. Attempted to reach CST team phone #[phone].  Left HIPAA compliant voicemail. Patient discussed Dr. Jannifer Franklin.      Associated Signs/Symptoms: Depression Symptoms:  suicidal thoughts without plan, anxiety, (Hypo) Manic Symptoms:  Delusions, Distractibility, Flight of Ideas, Impulsivity, Anxiety Symptoms:  Excessive Worry, Psychotic Symptoms:  Delusions, Paranoia, PTSD Symptoms: NA Total Time spent with patient: 45 minutes  Past Psychiatric History:   Is the patient at risk to self? Yes.    Has the patient been a risk to self in the past 6 months? Yes.    Has the patient been a risk to self within the distant  past? Yes.    Is the patient a risk to others? No.  Has the patient been a risk to others in the past 6 months? No.  Has the patient been a risk to others within the distant past? No.   Prior Inpatient Therapy: Prior Inpatient Therapy: Yes Prior Therapy Dates: Multiple Prior Therapy Facilty/Provider(s): [facility], [facility] Reason for Treatment: Schizophrenia, paranoid type Prior Outpatient Therapy: Prior Outpatient Therapy: Yes Prior Therapy Dates: Ongoing Prior Therapy Facilty/Provider(s): [facility] Reason for Treatment: [facility] Does patient have an ACCT team?: No Does patient have Intensive In-House Services?  : No Does patient have [facility] services? : No Does patient have P4CC services?: No  Alcohol Screening:   Substance Abuse History in the last 12 months:  No. Consequences of Substance Abuse: NA Previous Psychotropic Medications: Yes  Psychological Evaluations: Yes  Past Medical History:  Past Medical History:  Diagnosis Date   Arthritis    Depression    Hallucinations    Paranoid schizophrenia (HCC)    Schizoaffective disorder (HCC)    No past surgical history on file. Family History: No family history on file. Family Psychiatric History: Unknown Tobacco Screening:   Social History:  Social History   Substance and Sexual Activity  Alcohol Use Not Currently   Comment: She denies      Social History   Substance and Sexual Activity  Drug Use Not Currently   Comment: She denies     Additional Social History: Marital status: Single    Pain Medications: see MAR Prescriptions: see MAR Over the Counter: see MAR History of alcohol / drug use?: No history of alcohol / drug abuse  Allergies:  No Known Allergies Lab Results: No results found for this or any previous visit (from the past 48 hour(s)).  Blood Alcohol level:  Lab Results  Component Value Date   ETH <10 03/10/2020   ETH <10 62/13/0865    Metabolic Disorder  Labs:  Lab Results  Component Value Date   HGBA1C 4.8 08/06/2019   MPG 91.06 08/06/2019   MPG 88.19 06/26/2018   Lab Results  Component Value Date   PROLACTIN 28.9 (H) 06/26/2018   Lab Results  Component Value Date   CHOL 160 09/19/2019   TRIG 52 09/19/2019   HDL 46 09/19/2019   CHOLHDL 3.5 09/19/2019   VLDL 10 09/19/2019   LDLCALC 104 (H) 09/19/2019   LDLCALC 90 08/06/2019    Current Medications: Current Facility-Administered Medications  Medication Dose Route Frequency Provider Last Rate Last Admin   acetaminophen (TYLENOL) tablet 650 mg  650 mg Oral Q6H PRN Emmaline Kluver, FNP       alum & mag hydroxide-simeth (MAALOX/MYLANTA) 200-200-20 MG/5ML suspension 30 mL  30 mL Oral Q4H PRN Emmaline Kluver, FNP       hydrOXYzine (ATARAX/VISTARIL) tablet 25 mg  25 mg Oral TID PRN Emmaline Kluver, FNP   25 mg at 03/27/20 1509   magnesium hydroxide (MILK OF MAGNESIA) suspension 30 mL  30 mL Oral Daily PRN Emmaline Kluver, FNP       risperiDONE (RISPERDAL) tablet 2 mg  2 mg Oral BID Emmaline Kluver, FNP       traZODone (DESYREL) tablet 100 mg  100 mg Oral QHS Emmaline Kluver, FNP       PTA Medications: Medications Prior to Admission  Medication Sig Dispense Refill Last Dose   benztropine (COGENTIN) 1 MG tablet Take 1 tablet (1 mg total) by mouth 2 (two) times daily. (Patient not taking: Reported on 12/30/2019) 60 tablet 3    dicyclomine (BENTYL) 20 MG tablet Take 1 tablet (20 mg total) by mouth 2 (two) times daily. 20 tablet 0    ondansetron (ZOFRAN ODT) 4 MG disintegrating tablet Take 1 tablet (4 mg total) by mouth every 8 (eight) hours as needed for nausea or vomiting. 20 tablet 0    paliperidone (INVEGA SUSTENNA) 156 MG/ML SUSY injection Inject 1 mL (156 mg total) into the muscle once for 1 dose. DUE 12/13/2019 1 mL 0    risperidone (RISPERDAL) 4 MG tablet Take 1 tablet (4 mg total) by mouth 2 (two) times daily. (Patient not taking: Reported on 12/30/2019) 60 tablet 2    sertraline (ZOLOFT) 50 MG tablet  Take 1 tablet (50 mg total) by mouth daily. 30 tablet 0    traZODone (DESYREL) 150 MG tablet Take 1 tablet (150 mg total) by mouth at bedtime as needed and may repeat dose one time if needed for sleep. 90 tablet 1     Musculoskeletal: Strength & Muscle Tone: within normal limits Gait & Station: normal Patient leans: N/A  Psychiatric Specialty Exam: Physical Exam  Nursing note and vitals reviewed. Constitutional: She is oriented to person, place, and time. She appears well-developed.  HENT:  Head: Normocephalic.  Cardiovascular: Normal rate.  Respiratory: Effort normal.  Musculoskeletal:        General: Normal range of motion.  Neurological: She is alert and oriented to person, place, and time.  Psychiatric: Her mood appears anxious. Her speech is tangential. She is actively hallucinating. Thought content is paranoid and delusional. Cognition and memory are impaired. She expresses inappropriate  judgment.    Review of Systems  Blood pressure 134/85, pulse (!) 102, temperature 98.4 F (36.9 C), temperature source Oral, resp. rate 18, SpO2 97 %.There is no height or weight on file to calculate BMI.  General Appearance: Casual and Fairly Groomed  Eye Contact:  Good  Speech:  Clear and Coherent and Normal Rate  Volume:  Normal  Mood:  Anxious  Affect:  Appropriate and Congruent  Thought Process:  Coherent, Goal Directed and Descriptions of Associations: Tangential  Orientation:  Full (Time, Place, and Person)  Thought Content:  Delusions and Paranoid Ideation  Suicidal Thoughts:  Yes.  without intent/plan  Homicidal Thoughts:  No  Memory:  Immediate;   Good Recent;   Good  Judgement:  Fair  Insight:  Lacking  Psychomotor Activity:  Normal  Concentration:  Concentration: Fair and Attention Span: Fair  Recall:  [employer] of Knowledge:  Fair  Language:  Fair  Akathisia:  No  Handed:  Right  AIMS (if indicated):     Assets:  Communication Skills Desire for  Improvement Financial Resources/Insurance Housing Intimacy Leisure Time Physical Health Resilience Social Support  ADL's:  Intact  Cognition:  WNL  Sleep:         Treatment Plan Summary: Daily contact with patient to assess and evaluate symptoms and progress in treatment  Observation Level/Precautions:  15 minute checks Laboratory:  HCG Psychotherapy:   Medications:  See [facility] Consultations:  PRN Discharge Concerns:   Estimated LOS:24 hours Other:      Patrcia Dolly, FNP 5/22/20215:29 PM Patient seen face-to-face for psychiatric evaluation, chart reviewed and case discussed with the physician extender and developed treatment plan. Reviewed the information documented and agree with the treatment plan. Thedore Mins, MD

## 2020-03-28 DIAGNOSIS — F200 Paranoid schizophrenia: Secondary | ICD-10-CM | POA: Diagnosis not present

## 2020-03-28 DIAGNOSIS — Z79899 Other long term (current) drug therapy: Secondary | ICD-10-CM | POA: Diagnosis not present

## 2020-03-28 DIAGNOSIS — M1990 Unspecified osteoarthritis, unspecified site: Secondary | ICD-10-CM | POA: Diagnosis not present

## 2020-03-28 DIAGNOSIS — Z20822 Contact with and (suspected) exposure to covid-19: Secondary | ICD-10-CM | POA: Diagnosis not present

## 2020-03-28 NOTE — [facility] Suicide Risk Assessment (Cosign Needed)
Suicide Risk Assessment  Discharge Assessment   [facility] Discharge Suicide Risk Assessment   Principal Problem: <principal problem not specified> Discharge Diagnoses: Active Problems:   Paranoid schizophrenia (HCC)   Total Time spent with patient: 30 minutes  Musculoskeletal: Strength & Muscle Tone: within normal limits Gait & Station: normal Patient leans: N/A  Psychiatric Specialty Exam:   Blood pressure 120/72, pulse 75, temperature 97.6 F (36.4 C), temperature source Oral, resp. rate 18, SpO2 97 %.There is no height or weight on file to calculate BMI.  General Appearance: Casual and Fairly Groomed  Eye Contact::  Good  Speech:  Clear and Coherent and Normal Rate409  Volume:  Normal  Mood:  Euthymic  Affect:  Appropriate and Congruent  Thought Process:  Coherent, Goal Directed and Descriptions of Associations: Intact  Orientation:  Full (Time, Place, and Person)  Thought Content:  WDL and Logical  Suicidal Thoughts:  No  Homicidal Thoughts:  No  Memory:  Immediate;   Good Recent;   Good Remote;   Good  Judgement:  Fair  Insight:  Fair  Psychomotor Activity:  Normal  Concentration:  Good  Recall:  Good  Fund of Knowledge:Good  Language: Good  Akathisia:  No  Handed:  Right  AIMS (if indicated):     Assets:  Communication Skills Desire for Improvement Financial Resources/Insurance Housing Intimacy Leisure Time Physical Health Resilience Social Support Talents/Skills  Sleep:     Cognition: WNL  ADL's:  Intact   Mental Status Per Nursing Assessment::   On Admission:  Suicidal ideation indicated by patient  Patient assessed by nurse practitioner along with Dr Jannifer Franklin. Patient denies suicidal and homicidal ideations. Patient denies auditory and visual hallucinations. Patient denies paranoid ideations at this time.   Demographic Factors:  NA  Loss Factors: NA  Historical Factors: NA  Risk Reduction Factors:   Positive social support, Positive  therapeutic relationship and Positive coping skills or problem solving skills  Continued Clinical Symptoms:  Schizophrenia:   Paranoid or undifferentiated type  Cognitive Features That Contribute To Risk:  None    Suicide Risk:  Minimal: No identifiable suicidal ideation.  Patients presenting with no risk factors but with morbid ruminations; may be classified as minimal risk based on the severity of the depressive symptoms    Plan Of Care/Follow-up recommendations:  Other:  Follow up with CST team  Patrcia Dolly, FNP 03/28/2020, 12:27 PM

## 2020-03-28 NOTE — Progress Notes (Signed)
Patient ID: [name] [name], female   DOB: 1975/12/07, 44 y.o.   MRN: [id number] Patient currently denies suicidal ideations, denies homicidal ideations, and denies auditory/visual hallucinations, even though she is observed talking to her self. Pt verbally contracts for safety outside of the hospital, discharge orders have been entered, and pt is awaiting her CSP team to provide transportation home.

## 2020-03-28 NOTE — Progress Notes (Signed)
Patient ID: [name] [name], female   DOB: [birth year], 44 y.o.   MRN: [id number] Transportation home provided by pt's CSP team. Pt given all discharge instructions and educated on them, left hospital with all personal belongings

## 2020-03-28 NOTE — Progress Notes (Signed)
   03/28/20 0827  Psych Admission Type (Psych Patients Only)  Admission Status Voluntary  Psychosocial Assessment  Patient Complaints None  Eye Contact Fair  Facial Expression Anxious;Worried  Affect Anxious  Armed forces logistics/support/administrative officer Activity Other (Comment) (WNL)  Appearance/Hygiene Unremarkable  Thought Chartered certified accountant of ideas  Content Ambivalence  Delusions None reported or observed  Perception Hallucinations  Hallucination Auditory  Judgment Impaired  Confusion None  Danger to Self  Current suicidal ideation? Denies  Self-Injurious Behavior No self-injurious ideation or behavior indicators observed or expressed   Agreement Not to Harm Self Yes  Description of Agreement verbally contracts for safety  Danger to Others  Danger to Others None reported or observed

## 2020-03-28 NOTE — Progress Notes (Signed)
Patient with loud screaming in room responding to voices. Patient experiencing visual hallucinations as well told mental health tech someone went into her room and took water out of her bathroom. Patient accusing this RN of going into her room and taking the water. Patient reoriented to reality and offered po medication to which patient refused. Patient also paranoid stating I don't know what you put in that water. Will continue to monitor for increased agitation.

## 2020-03-28 NOTE — Discharge Summary (Addendum)
Physician Discharge Summary Note  Patient:  [name] [name] is an 44 y.o., female MRN:  [id number] DOB:  [birth year] Patient phone:  [phone] (home)  Patient address:   [address] [place] [address],  Total Time spent with patient: 30 minutes  Date of Admission:  03/27/2020 Date of Discharge: 03/28/2020  Reason for Admission:.  Patient admitted with paranoid ideations.  Patient currently denies suicidal and homicidal ideations.  Patient denies auditory visual hallucinations.  Patient denies symptoms of paranoia today.  Principal Problem: <principal problem not specified> Discharge Diagnoses: Active Problems:   Paranoid schizophrenia (HCC)   Past Psychiatric History: Paranoid schizophrenia  Past Medical History:  Past Medical History:  Diagnosis Date   Arthritis    Depression    Hallucinations    Paranoid schizophrenia (HCC)    Schizoaffective disorder (HCC)    No past surgical history on file. Family History: No family history on file. Family Psychiatric  History: unknown Social History:  Social History   Substance and Sexual Activity  Alcohol Use Not Currently   Comment: She denies      Social History   Substance and Sexual Activity  Drug Use Not Currently   Comment: She denies     Social History   Socioeconomic History   Marital status: Single    Spouse name: Not on file   Number of children: Not on file   Years of education: Not on file   Highest education level: Not on file  Occupational History   Occupation: Disability  Tobacco Use   Smoking status: Never Smoker   Smokeless tobacco: Never Used  Substance and Sexual Activity   Alcohol use: Not Currently    Comment: She denies    Drug use: Not Currently    Comment: She denies    Sexual activity: Not Currently  Other Topics Concern   Not on file  Social History Narrative   Pt stated that she lives in [place], and that she lives alone.  Pt receives outpatient psychiatric resources  through [employer].   Social Determinants of Health   Financial Resource Strain:    Difficulty of Paying Living Expenses:   Food Insecurity:    Worried About Programme researcher, broadcasting/film/video in the Last Year:    Barista in the Last Year:   Transportation Needs:    Freight forwarder (Medical):    Lack of Transportation (Non-Medical):   Physical Activity:    Days of Exercise per Week:    Minutes of Exercise per Session:   Stress:    Feeling of Stress :   Social Connections:    Frequency of Communication with Friends and Family:    Frequency of Social Gatherings with Friends and Family:    Attends Religious Services:    Active Member of Clubs or Organizations:    Attends Banker Meetings:    Marital Status:     Hospital Course: Patient admitted to observation for safety and stabilization.  Patient denies all crisis criteria today.  Patient discharged, verbalizes plan to follow-up with CST team on Monday morning.  Physical Findings: AIMS:  , ,  ,  ,    CIWA:    COWS:     Musculoskeletal: Strength & Muscle Tone: within normal limits Gait & Station: normal Patient leans: N/A  Psychiatric Specialty Exam: Physical Exam  Nursing note and vitals reviewed. Constitutional: She is oriented to person, place, and time. She appears well-developed.  HENT:  Head:  Normocephalic.  Cardiovascular: Normal rate.  Respiratory: Effort normal.  Musculoskeletal:        General: Normal range of motion.     Cervical back: Normal range of motion.  Neurological: She is alert and oriented to person, place, and time.  Psychiatric: She has a normal mood and affect. Her behavior is normal. Judgment and thought content normal.    Review of Systems  Constitutional: Negative.   HENT: Negative.   Eyes: Negative.   Respiratory: Negative.   Cardiovascular: Negative.   Gastrointestinal: Negative.   Genitourinary: Negative.   Musculoskeletal: Negative.   Skin: Negative.    Neurological: Negative.   Psychiatric/Behavioral: Negative.     Blood pressure 120/72, pulse 75, temperature 97.6 F (36.4 C), temperature source Oral, resp. rate 18, SpO2 97 %.There is no height or weight on file to calculate BMI.  General Appearance: Casual and Fairly Groomed  Eye Contact:  Good  Speech:  Clear and Coherent and Normal Rate  Volume:  Normal  Mood:  Euthymic  Affect:  Appropriate and Congruent  Thought Process:  Coherent, Goal Directed and Descriptions of Associations: Intact  Orientation:  Full (Time, Place, and Person)  Thought Content:  WDL and Logical  Suicidal Thoughts:  No  Homicidal Thoughts:  No  Memory:  Immediate;   Good Recent;   Good Remote;   Good  Judgement:  Fair  Insight:  Fair  Psychomotor Activity:  Normal  Concentration:  Concentration: Good and Attention Span: Good  Recall:  Good    Fund of Knowledge:  Good  Language:  Good  Akathisia:  No  Handed:  Right  AIMS (if indicated):     Assets:  Communication Skills Desire for Improvement Financial Resources/Insurance Housing Intimacy Leisure Time Physical Health Resilience Social Support Talents/Skills  ADL's:  Intact  Cognition:  WNL  Sleep:           Has this patient used any form of tobacco in the last 30 days? (Cigarettes, Smokeless Tobacco, Cigars, and/or Pipes) Yes, Yes, A prescription for an FDA-approved tobacco cessation medication was offered at discharge and the patient refused  Blood Alcohol level:  Lab Results  Component Value Date   ETH <10 03/10/2020   ETH <10 11/30/2019    Metabolic Disorder Labs:  Lab Results  Component Value Date   HGBA1C 4.8 08/06/2019   MPG 91.06 08/06/2019   MPG 88.19 06/26/2018   Lab Results  Component Value Date   PROLACTIN 28.9 (H) 06/26/2018   Lab Results  Component Value Date   CHOL 160 09/19/2019   TRIG 52 09/19/2019   HDL 46 09/19/2019   CHOLHDL 3.5 09/19/2019   VLDL 10 09/19/2019   LDLCALC 104 (H) 09/19/2019    LDLCALC 90 08/06/2019    See Psychiatric Specialty Exam and Suicide Risk Assessment completed by Attending Physician prior to discharge.  Discharge destination:  Home  Is patient on multiple antipsychotic therapies at discharge:  No   Has Patient had three or more failed trials of antipsychotic monotherapy by history:  No  Recommended Plan for Multiple Antipsychotic Therapies: NA    Allergies as of 03/28/2020   No Known Allergies      Medication List     TAKE these medications      Indication  benztropine 1 MG tablet Commonly known as: COGENTIN Take 1 tablet (1 mg total) by mouth 2 (two) times daily.  Indication: Extrapyramidal Reaction caused by Medications   Invega Sustenna 234 MG/1.5ML Susy injection Generic drug:  paliperidone Inject 234 mg into the muscle every 28 (twenty-eight) days.  Indication: Schizophrenia   risperidone 4 MG tablet Commonly known as: RISPERDAL Take 1 tablet (4 mg total) by mouth 2 (two) times daily.  Indication: Hypomanic Episode of Bipolar Disorder   sertraline 50 MG tablet Commonly known as: ZOLOFT Take 1 tablet (50 mg total) by mouth daily.  Indication: Major Depressive Disorder   traZODone 150 MG tablet Commonly known as: DESYREL Take 1 tablet (150 mg total) by mouth at bedtime as needed and may repeat dose one time if needed for sleep.  Indication: Trouble Sleeping          Follow-up recommendations:  Other:  Follow up with established outpatient  Comments:  Discharge  Signed: Emmaline Kluver, [place] 03/28/2020, 12:37 PM Patient seen face-to-face for psychiatric evaluation, chart reviewed and case discussed with the physician extender and developed treatment plan. Reviewed the information documented and agree with the treatment plan. Corena Pilgrim, MD

## 2020-04-11 ENCOUNTER — Emergency Department (HOSPITAL_COMMUNITY)
Admission: EM | Admit: 2020-04-11 | Discharge: 2020-04-12 | Disposition: A | Discharge status: Home / Self Care | Payer: Medicare Other | Attending: Emergency Medicine | Admitting: Emergency Medicine

## 2020-04-11 DIAGNOSIS — R442 Other hallucinations: Secondary | ICD-10-CM | POA: Diagnosis not present

## 2020-04-11 DIAGNOSIS — F29 Unspecified psychosis not due to a substance or known physiological condition: Secondary | ICD-10-CM | POA: Diagnosis not present

## 2020-04-11 DIAGNOSIS — Z79899 Other long term (current) drug therapy: Secondary | ICD-10-CM | POA: Insufficient documentation

## 2020-04-11 DIAGNOSIS — R52 Pain, unspecified: Secondary | ICD-10-CM | POA: Diagnosis not present

## 2020-04-11 DIAGNOSIS — Z760 Encounter for issue of repeat prescription: Secondary | ICD-10-CM | POA: Insufficient documentation

## 2020-04-11 DIAGNOSIS — R4182 Altered mental status, unspecified: Secondary | ICD-10-CM | POA: Diagnosis not present

## 2020-04-11 DIAGNOSIS — R5381 Other malaise: Secondary | ICD-10-CM | POA: Diagnosis not present

## 2020-04-11 NOTE — ED Notes (Signed)
no answer for triage x1

## 2020-04-11 NOTE — ED Notes (Signed)
No answer for triage x3 

## 2020-04-12 ENCOUNTER — Other Ambulatory Visit: Payer: Self-pay

## 2020-04-12 DIAGNOSIS — Z760 Encounter for issue of repeat prescription: Secondary | ICD-10-CM | POA: Diagnosis not present

## 2020-04-12 MED ORDER — SERTRALINE HCL 50 MG PO TABS
50.0000 mg | ORAL_TABLET | Freq: Once | ORAL | Status: AC
  Administered 2020-04-12: 50 mg via ORAL
  Filled 2020-04-12: qty 1

## 2020-04-12 MED ORDER — RISPERIDONE 2 MG PO TABS
4.0000 mg | ORAL_TABLET | Freq: Once | ORAL | Status: AC
  Administered 2020-04-12: 4 mg via ORAL
  Filled 2020-04-12: qty 2

## 2020-04-12 MED ORDER — BENZTROPINE MESYLATE 1 MG PO TABS
1.0000 mg | ORAL_TABLET | Freq: Once | ORAL | Status: AC
  Administered 2020-04-12: 1 mg via ORAL
  Filled 2020-04-12: qty 1

## 2020-04-12 NOTE — ED Notes (Signed)
All discharge instructions reviewed with pt. Pt denies questions. Pt discharged without issue.

## 2020-04-12 NOTE — ED Triage Notes (Signed)
Pt was asleep in the lobby, that is why she did not hear her name for triage.  Pt reports she needs a refill on her medications.

## 2020-04-12 NOTE — Discharge Instructions (Signed)
Call your psychiatrist in the morning to have medications refilled.

## 2020-04-12 NOTE — ED Provider Notes (Signed)
[facility] EMERGENCY DEPARTMENT Provider Note   CSN: [id number] Arrival date & time: 04/11/20  1845     History Chief Complaint  Patient presents with   medication refill    [name] [name] is a 44 y.o. female.  The history is provided by the patient and medical records.    44 year old female with history of schizoaffective disorder, depression, arthritis, presenting to the ED requesting medication.  States she ran out of her medications this morning so she has not had them today.  These are routinely prescribed by psychiatry.  She has not contacted them to let them know her prescriptions have lapsed and.  She is requesting her medications here.  No other complaints at this time.  Past Medical History:  Diagnosis Date   Arthritis    Depression    Hallucinations    Paranoid schizophrenia ([place])    Schizoaffective disorder [facility])     Patient Active Problem List   Diagnosis Date Noted   Paranoid schizophrenia ([place]) 03/27/2020   Schizophrenia, paranoid type ([place]) 03/20/2020   Homelessness 07/06/2019   Hallucinations     No past surgical history on file.   OB History   No obstetric history on file.     No family history on file.  Social History   Tobacco Use   Smoking status: Never Smoker   Smokeless tobacco: Never Used  Substance Use Topics   Alcohol use: Not Currently    Comment: She denies    Drug use: Not Currently    Comment: She denies     Home Medications Prior to Admission medications   Medication Sig Start Date End Date Taking? Authorizing Provider  benztropine (COGENTIN) 1 MG tablet Take 1 tablet (1 mg total) by mouth 2 (two) times daily. 11/13/19   [name], MD  INVEGA SUSTENNA 234 MG/1.5ML SUSY injection Inject 234 mg into the muscle every 28 (twenty-eight) days. 03/24/20   [provider]  risperidone (RISPERDAL) 4 MG tablet Take 1 tablet (4 mg total) by mouth 2 (two) times daily. 11/13/19   [name], MD   sertraline (ZOLOFT) 50 MG tablet Take 1 tablet (50 mg total) by mouth daily. 09/22/19   Rankin, Shuvon B, NP  traZODone (DESYREL) 150 MG tablet Take 1 tablet (150 mg total) by mouth at bedtime as needed and may repeat dose one time if needed for sleep. 11/13/19   [name], MD    Allergies    Patient has no known allergies.  Review of Systems   Review of Systems  Constitutional:       Med refill  All other systems reviewed and are negative.   Physical Exam Updated Vital Signs BP 104/71 (BP Location: Left Arm)    Pulse 80    Temp 97.7 F (36.5 C) (Oral)    Resp 16    SpO2 100%   Physical Exam Vitals and nursing note reviewed.  Constitutional:      Appearance: She is well-developed.     Comments: Sleeping, awoken for exam but continues rolling over to sleep when asked questions  HENT:     Head: Normocephalic and atraumatic.  Eyes:     Conjunctiva/sclera: Conjunctivae normal.     Pupils: Pupils are equal, round, and reactive to light.  Cardiovascular:     Rate and Rhythm: Normal rate and regular rhythm.     Heart sounds: Normal heart sounds.  Pulmonary:     Effort: Pulmonary effort is normal.  Breath sounds: Normal breath sounds.  Abdominal:     General: Bowel sounds are normal.     Palpations: Abdomen is soft.  Musculoskeletal:        General: Normal range of motion.     Cervical back: Normal range of motion.  Skin:    General: Skin is warm and dry.  Neurological:     Mental Status: She is alert and oriented to person, place, and time.     ED Results / Procedures / Treatments   Labs (all labs ordered are listed, but only abnormal results are displayed) Labs Reviewed - No data to display  EKG None  Radiology No results found.  Procedures Procedures (including critical care time)  Medications Ordered in ED Medications  benztropine (COGENTIN) tablet 1 mg (1 mg Oral Given 04/12/20 0235)  risperiDONE (RISPERDAL) tablet 4 mg (4 mg Oral Given 04/12/20 0235)   sertraline (ZOLOFT) tablet 50 mg (50 mg Oral Given 04/12/20 0235)    ED Course  I have reviewed the triage vital signs and the nursing notes.  Pertinent labs & imaging results that were available during my care of the patient were reviewed by me and considered in my medical decision making (see chart for details).    MDM Rules/Calculators/A&P  44 y.o. F here requesting refills on psychiatric medications.  States she just ran out earlier today.  She did not call her psychiatrist yet to tell them she was out of medications.  Patient without further complaints, no SI/HI.  She is not responding to internal stimuli on exam, NAD.  Given dose of meds here, encouraged to call her psychiatrist in the morning to have her medications refilled.  She may return here for new concerns.  Final Clinical Impression(s) / ED Diagnoses Final diagnoses:  Encounter for medication refill    Rx / DC Orders ED Discharge Orders    None       Garlon Hatchet, PA-C 04/12/20 0319    Gilda Crease, MD 04/12/20 [phone]

## 2020-04-18 ENCOUNTER — Emergency Department (HOSPITAL_COMMUNITY)
Admission: EM | Admit: 2020-04-18 | Discharge: 2020-04-19 | Disposition: A | Discharge status: Home / Self Care | Payer: Medicare Other | Attending: Emergency Medicine | Admitting: Emergency Medicine

## 2020-04-18 ENCOUNTER — Encounter (HOSPITAL_COMMUNITY): Payer: Self-pay | Admitting: Emergency Medicine

## 2020-04-18 ENCOUNTER — Other Ambulatory Visit: Payer: Self-pay

## 2020-04-18 DIAGNOSIS — F23 Brief psychotic disorder: Secondary | ICD-10-CM

## 2020-04-18 DIAGNOSIS — Z79899 Other long term (current) drug therapy: Secondary | ICD-10-CM | POA: Insufficient documentation

## 2020-04-18 DIAGNOSIS — Z20822 Contact with and (suspected) exposure to covid-19: Secondary | ICD-10-CM | POA: Diagnosis not present

## 2020-04-18 DIAGNOSIS — R110 Nausea: Secondary | ICD-10-CM | POA: Diagnosis not present

## 2020-04-18 DIAGNOSIS — F22 Delusional disorders: Secondary | ICD-10-CM | POA: Insufficient documentation

## 2020-04-18 DIAGNOSIS — R197 Diarrhea, unspecified: Secondary | ICD-10-CM | POA: Diagnosis not present

## 2020-04-18 DIAGNOSIS — F259 Schizoaffective disorder, unspecified: Secondary | ICD-10-CM | POA: Insufficient documentation

## 2020-04-18 DIAGNOSIS — F200 Paranoid schizophrenia: Secondary | ICD-10-CM | POA: Diagnosis not present

## 2020-04-18 LAB — RAPID URINE DRUG SCREEN, HOSP PERFORMED
Amphetamines: NOT DETECTED
Barbiturates: NOT DETECTED
Benzodiazepines: NOT DETECTED
Cocaine: NOT DETECTED
Opiates: NOT DETECTED
Tetrahydrocannabinol: NOT DETECTED

## 2020-04-18 LAB — URINALYSIS, ROUTINE W REFLEX MICROSCOPIC
Bilirubin Urine: NEGATIVE
Glucose, UA: NEGATIVE mg/dL
Ketones, ur: NEGATIVE mg/dL
Nitrite: NEGATIVE
Protein, ur: NEGATIVE mg/dL
Specific Gravity, Urine: 1.003 — ABNORMAL LOW (ref 1.005–1.030)
pH: 6 (ref 5.0–8.0)

## 2020-04-18 LAB — LIPASE, BLOOD: Lipase: 29 U/L (ref 11–51)

## 2020-04-18 LAB — CBC
HCT: 39.3 % (ref 36.0–46.0)
Hemoglobin: 12.4 g/dL (ref 12.0–15.0)
MCH: 28.2 pg (ref 26.0–34.0)
MCHC: 31.6 g/dL (ref 30.0–36.0)
MCV: 89.3 fL (ref 80.0–100.0)
Platelets: 309 10*3/uL (ref 150–400)
RBC: 4.4 MIL/uL (ref 3.87–5.11)
RDW: 13.6 % (ref 11.5–15.5)
WBC: 4.7 10*3/uL (ref 4.0–10.5)
nRBC: 0 % (ref 0.0–0.2)

## 2020-04-18 LAB — COMPREHENSIVE METABOLIC PANEL
ALT: 16 U/L (ref 0–44)
AST: 22 U/L (ref 15–41)
Albumin: 4 g/dL (ref 3.5–5.0)
Alkaline Phosphatase: 45 U/L (ref 38–126)
Anion gap: 11 (ref 5–15)
BUN: 12 mg/dL (ref 6–20)
CO2: 23 mmol/L (ref 22–32)
Calcium: 9.7 mg/dL (ref 8.9–10.3)
Chloride: 105 mmol/L (ref 98–111)
Creatinine, Ser: 0.65 mg/dL (ref 0.44–1.00)
GFR calc Af Amer: >60 mL/min (ref >60)
GFR calc non Af Amer: >60 mL/min (ref >60)
Glucose, Bld: 105 mg/dL — ABNORMAL HIGH (ref 70–99)
Potassium: 3.7 mmol/L (ref 3.5–5.1)
Sodium: 139 mmol/L (ref 135–145)
Total Bilirubin: 0.7 mg/dL (ref 0.3–1.2)
Total Protein: 7.3 g/dL (ref 6.5–8.1)

## 2020-04-18 LAB — SARS CORONAVIRUS 2 BY RT PCR (HOSPITAL ORDER, PERFORMED IN ~~LOC~~ HOSPITAL LAB): SARS Coronavirus 2: NEGATIVE

## 2020-04-18 LAB — ETHANOL: Alcohol, Ethyl (B): <10 mg/dL (ref <10)

## 2020-04-18 LAB — I-STAT BETA HCG BLOOD, ED (MC, WL, AP ONLY): I-stat hCG, quantitative: <5 m[IU]/mL (ref <5)

## 2020-04-18 MED ORDER — ZIPRASIDONE MESYLATE 20 MG IM SOLR
10.0000 mg | Freq: Once | INTRAMUSCULAR | Status: AC
  Administered 2020-04-18: 10 mg via INTRAMUSCULAR
  Filled 2020-04-18: qty 20

## 2020-04-18 MED ORDER — ACETAMINOPHEN 325 MG PO TABS: 650.0000 mg | ORAL_TABLET | ORAL | Status: DC | PRN

## 2020-04-18 MED ORDER — ONDANSETRON 4 MG PO TBDP
8.0000 mg | ORAL_TABLET | Freq: Once | ORAL | Status: DC
  Filled 2020-04-18: qty 2

## 2020-04-18 MED ORDER — LORAZEPAM 2 MG/ML IJ SOLN
2.0000 mg | Freq: Once | INTRAMUSCULAR | Status: AC
  Administered 2020-04-18: 2 mg via INTRAMUSCULAR
  Filled 2020-04-18: qty 1

## 2020-04-18 MED ORDER — ZOLPIDEM TARTRATE 5 MG PO TABS: 5.0000 mg | ORAL_TABLET | Freq: Every evening | ORAL | Status: DC | PRN

## 2020-04-18 MED ORDER — TRAZODONE HCL 50 MG PO TABS: 150.0000 mg | ORAL_TABLET | Freq: Every evening | ORAL | Status: DC | PRN

## 2020-04-18 MED ORDER — RISPERIDONE 3 MG PO TABS
4.0000 mg | ORAL_TABLET | Freq: Two times a day (BID) | ORAL | Status: DC
  Administered 2020-04-18 – 2020-04-19 (×3): 4 mg via ORAL
  Filled 2020-04-18 (×3): qty 1

## 2020-04-18 MED ORDER — SODIUM CHLORIDE 0.9% FLUSH: 3.0000 mL | Freq: Once | INTRAVENOUS | Status: DC

## 2020-04-18 MED ORDER — STERILE WATER FOR INJECTION IJ SOLN
INTRAMUSCULAR | Status: AC
  Administered 2020-04-18: 1.2 mL
  Filled 2020-04-18: qty 10

## 2020-04-18 MED ORDER — BENZTROPINE MESYLATE 1 MG PO TABS
1.0000 mg | ORAL_TABLET | Freq: Two times a day (BID) | ORAL | Status: DC
  Administered 2020-04-18 – 2020-04-19 (×3): 1 mg via ORAL
  Filled 2020-04-18 (×3): qty 1

## 2020-04-18 MED ORDER — SERTRALINE HCL 50 MG PO TABS
50.0000 mg | ORAL_TABLET | Freq: Every day | ORAL | Status: DC
  Administered 2020-04-18 – 2020-04-19 (×2): 50 mg via ORAL
  Filled 2020-04-18 (×2): qty 1

## 2020-04-18 NOTE — ED Notes (Signed)
we

## 2020-04-18 NOTE — ED Provider Notes (Signed)
[facility] EMERGENCY DEPARTMENT Provider Note   CSN: [id number] Arrival date & time: 04/18/20  1038     History Chief Complaint  Patient presents with  . Nausea    [name] [name] is a 44 y.o. female.  HPI She presents for evaluation of nausea which started "2 hours ago."  She has also had some diarrhea in the last few days.  She denies fever, chills, cough, shortness of breath, weakness or dizziness.  She also states "I want to have my diagnosis changed."  She states that she has "depression, not schizophrenia."  I asked her if she had a psychiatrist and she stated that she had a nurse practitioner who which she has previously asked to have schizophrenia removed from her diagnoses, but was told that that practitioner could not do it.  She sees this practitioner in the outpatient setting at a psychiatric treatment facility.  There are no other known modifying factors.    Past Medical History:  Diagnosis Date  . Arthritis   . Depression   . Hallucinations   . Paranoid schizophrenia ([place])   . Schizoaffective disorder [facility])     Patient Active Problem List   Diagnosis Date Noted  . Paranoid schizophrenia ([place]) 03/27/2020  . Schizophrenia, paranoid type ([place]) 03/20/2020  . Homelessness 07/06/2019  . Hallucinations     History reviewed. No pertinent surgical history.   OB History   No obstetric history on file.     No family history on file.  Social History   Tobacco Use  . Smoking status: Never Smoker  . Smokeless tobacco: Never Used  Vaping Use  . Vaping Use: Never used  Substance Use Topics  . Alcohol use: Not Currently    Comment: She denies   . Drug use: Not Currently    Comment: She denies     Home Medications Prior to Admission medications   Medication Sig Start Date End Date Taking? Authorizing Provider  benztropine (COGENTIN) 1 MG tablet Take 1 tablet (1 mg total) by mouth 2 (two) times daily. 11/13/19   [name], MD  INVEGA  SUSTENNA 234 MG/1.5ML SUSY injection Inject 234 mg into the muscle every 28 (twenty-eight) days. 03/24/20   [provider]  risperidone (RISPERDAL) 4 MG tablet Take 1 tablet (4 mg total) by mouth 2 (two) times daily. 11/13/19   [name], MD  sertraline (ZOLOFT) 50 MG tablet Take 1 tablet (50 mg total) by mouth daily. 09/22/19   Rankin, Shuvon B, NP  traZODone (DESYREL) 150 MG tablet Take 1 tablet (150 mg total) by mouth at bedtime as needed and may repeat dose one time if needed for sleep. 11/13/19   [name], MD    Allergies    Patient has no known allergies.  Review of Systems   Review of Systems  All other systems reviewed and are negative.   Physical Exam Updated Vital Signs BP 125/74 (BP Location: Left Arm)   Pulse (!) 108   Temp 98.1 F (36.7 C) (Oral)   Resp 14   LMP 03/18/2020   SpO2 98%   Physical Exam Vitals and nursing note reviewed.  Constitutional:      Appearance: She is well-developed.  HENT:     Head: Normocephalic and atraumatic.  Eyes:     Conjunctiva/sclera: Conjunctivae normal.     Pupils: Pupils are equal, round, and reactive to light.  Neck:     Trachea: Phonation normal.  Cardiovascular:     Rate and  Rhythm: Normal rate and regular rhythm.  Pulmonary:     Effort: Pulmonary effort is normal.     Breath sounds: Normal breath sounds.  Chest:     Chest wall: No tenderness.  Abdominal:     General: There is no distension.     Palpations: Abdomen is soft.     Tenderness: There is no abdominal tenderness. There is no guarding.  Musculoskeletal:        General: Normal range of motion.     Cervical back: Normal range of motion and neck supple.  Skin:    General: Skin is warm and dry.  Neurological:     Mental Status: She is alert and oriented to person, place, and time.     Cranial Nerves: No cranial nerve deficit.     Motor: No abnormal muscle tone.     Coordination: Coordination normal.  Psychiatric:        Attention and  Perception: Attention normal.        Mood and Affect: Mood is depressed.        Speech: She is communicative.        Behavior: Behavior is slowed and withdrawn. Behavior is not agitated, aggressive or hyperactive.        Thought Content: Thought content is not delusional. Thought content does not include suicidal plan.        Cognition and Memory: Cognition is not impaired.        Judgment: Judgment is not impulsive or inappropriate.     Comments: The patient speaks very softly, nearly a whisper.     ED Results / Procedures / Treatments   Labs (all labs ordered are listed, but only abnormal results are displayed) Labs Reviewed  COMPREHENSIVE METABOLIC PANEL - Abnormal; Notable for the following components:      Result Value   Glucose, Bld 105 (*)    All other components within normal limits  URINALYSIS, ROUTINE W REFLEX MICROSCOPIC - Abnormal; Notable for the following components:   Color, Urine STRAW (*)    Specific Gravity, Urine 1.003 (*)    Hgb urine dipstick SMALL (*)    Leukocytes,Ua TRACE (*)    Bacteria, UA RARE (*)    All other components within normal limits  SARS CORONAVIRUS 2 BY RT PCR (HOSPITAL ORDER, PERFORMED IN [facility])  LIPASE, BLOOD  CBC  ETHANOL  RAPID URINE DRUG SCREEN, HOSP PERFORMED  I-STAT BETA HCG BLOOD, ED ([facility], [facility], [facility] ONLY)    EKG None  Radiology No results found.  Procedures .Critical Care Performed by: Mancel Bale, MD Authorized by: Mancel Bale, MD   Critical care provider statement:    Critical care time (minutes):  45   Critical care start time:  04/18/2020 11:45 AM   Critical care end time:  04/18/2020 3:06 PM   Critical care time was exclusive of:  Separately billable procedures and treating other patients   Critical care was necessary to treat or prevent imminent or life-threatening deterioration of the following conditions: Acute psychosis.   Critical care was time spent personally by me on the following  activities:  Blood draw for specimens, development of treatment plan with patient or surrogate, discussions with consultants, evaluation of patient's response to treatment, examination of patient, obtaining history from patient or surrogate, ordering and performing treatments and interventions, ordering and review of laboratory studies, pulse oximetry, re-evaluation of patient's condition, review of old charts and ordering and review of radiographic studies   (including  critical care time)  Medications Ordered in ED Medications  sodium chloride flush (NS) 0.9 % injection 3 mL (has no administration in time range)  ondansetron (ZOFRAN-ODT) disintegrating tablet 8 mg (8 mg Oral Refused 04/18/20 1231)  acetaminophen (TYLENOL) tablet 650 mg (has no administration in time range)  zolpidem (AMBIEN) tablet 5 mg (has no administration in time range)  risperiDONE (RISPERDAL) tablet 4 mg (4 mg Oral Given 04/18/20 1350)  sertraline (ZOLOFT) tablet 50 mg (50 mg Oral Given 04/18/20 1350)  traZODone (DESYREL) tablet 150 mg (has no administration in time range)  benztropine (COGENTIN) tablet 1 mg (1 mg Oral Given 04/18/20 1350)  ziprasidone (GEODON) injection 10 mg (10 mg Intramuscular Given 04/18/20 1249)  LORazepam (ATIVAN) injection 2 mg (2 mg Intramuscular Given 04/18/20 1253)  sterile water (preservative free) injection (1.2 mLs  Given 04/18/20 1302)    ED Course  I have reviewed the triage vital signs and the nursing notes.  Pertinent labs & imaging results that were available during my care of the patient were reviewed by me and considered in my medical decision making (see chart for details).  Clinical Course as of Apr 18 1506  Sun Apr 18, 2020  1157 The patient was informed by me that I could not change her diagnosis of schizophrenia.   [EW]  1222 Normal except presence of hemoglobin, trace leukocytes and rare bacteria, with increased squamous epithelials.  This indicates a contaminated sample.   Urinalysis, Routine w reflex microscopic(!) [EW]  1222 Normal  Lipase, blood [EW]  1222 Normal  I-Stat beta hCG blood, ED [EW]  1222 Normal  Comprehensive metabolic panel(!) [EW]  1222 Normal  CBC [EW]  1222 Since the patient has been here she has contacted a police department in [place], by the telephone in her room.  Law Engineer, manufacturing systems here has been informed.  The patient was asked not to make additional calls, by the emergency department tech.   [EW]  1223 At this time the patient is tearful, crying, and will not answer questions.  A few minutes ago she told the nurse that she was "being tortured."  Prior to that the patient had been screaming, described as "wailing," by the nurse; then stopped when the nurse asked her to.  We will continue to monitor patient for now.   [EW]  1243 Patient is now delusional, running in the hall, screaming stating "God is trying to kill me."   [EW]  1243 Restraints and sedation ordered.   [EW]  1502 Normal  Ethanol [EW]  1502 Normal except presence of hemoglobin, leukocytes, rare bacteria and squamous cells.  This is not diagnostic for UTI.  Urinalysis, Routine w reflex microscopic(!) [EW]  1502 Normal  Urine rapid drug screen (hosp performed) [EW]  1503 Normal  SARS Coronavirus 2 by RT PCR (hospital order, performed in [facility] hospital lab) Nasopharyngeal Nasopharyngeal Swab [EW]    Clinical Course User Index [EW] Mancel Bale, MD   MDM Rules/Calculators/A&P                           Patient Vitals for the past 24 hrs:  BP Temp Temp src Pulse Resp SpO2  04/18/20 1103 125/74 98.1 F (36.7 C) Oral (!) 108 14 98 %    3:03 PM Reevaluation with update and discussion. After initial assessment and treatment, an updated evaluation reveals she remains calm and directable after IM medication.  IVC petition and first opinion paperwork  have been completed by me.Mancel Bale   Medical Decision Making:  This patient is presenting for  evaluation of nausea, with acute psychiatric condition, which does require a range of treatment options, and is a complaint that involves a high risk of morbidity and mortality. The differential diagnoses include acute infection, metabolic instability, psychiatric illness. I decided to review old records, and in summary patient with known schizophrenia, recently hospitalized twice for same, presenting with vague symptoms, with decompensation in the ED.  I did not require additional historical information from anyone.  Clinical Laboratory Tests Ordered, included CBC, metabolic panel, urinalysis, pregnancy test, UDS, alcohol level, Covid testing . Review indicates normal findings, not requiring intervention     Critical Interventions-clinical evaluation, laboratory testing, medication treatment, observation reassessment.  Holding orders for psychiatric care, and TTS consultation initiated.  IVC paperwork completed.  After These Interventions, the Patient was reevaluated and was found stable for management by psychiatric services, medically cleared.  CRITICAL CARE-yes Performed by: Mancel Bale  Nursing Notes Reviewed/ Care Coordinated Applicable Imaging Reviewed Interpretation of Laboratory Data incorporated into ED treatment   Plan-position per TTS in conjunction with oncoming provider team    Final Clinical Impression(s) / ED Diagnoses Final diagnoses:  Acute psychosis (HCC)  Nausea    Rx / DC Orders ED Discharge Orders    None       Mancel Bale, MD 04/18/20 1507

## 2020-04-18 NOTE — BH Assessment (Addendum)
Tele Assessment Note   Patient Name: [name] [name] MRN: [id number] Referring Physician: Eber Hong, MD Location of Patient: [facility] Location of Provider: Behavioral Health TTS Department  [name] [name] is a 44 y.o. female who presented to [facility] on voluntary basis with apparent ongoing delusion and altered mental status.  Pt lives in alone in [place], and she is on disability.  Pt receives outpatient psychiatric treatment for Schizoaffective Disorder through [facility].  Pt was last assessed by TTS in May 2021.  At that time, she presented with apparent paranoia.  Pt is known to TTS and has been assessed for delusion/paranoia on several occasions.  Pt is now under IVC (petitioner is EDP) due to altered mental status and bizarre behavior at hospital (crying, running down the hall).  Pt stated that she came to the hospital because she felt nauseated, and also because she wanted to have schizophrenia removed from her diagnosis.  She reported that she has not had her medication in two days.  She stated also that she is part of an investigation in [place].  When asked about whether she believed someone was trying to harm her, she stated that she does people are trying to harm her.  Pt has a history of paranoid delusion.  Pt stated also that she feels hopeless and that she has poor sleep.  Pt denied suicidal ideation, homicidal ideation, self-injurious behavior, hallucination, and substance use concerns.    Per hospital notes, Pt has appeared labile, crying and screaming, and she also ran down the hospital.  She also told hospital staff that people are trying to kill her.  When asked what she wanted to do today, Pt stated that she wanted to come into the hospital.  However, she also stated that she would be safe if discharged.  During assessment, Pt presented as alert and oriented.  She had good eye contact and was cooperative.  Pt was dressed in scrubs, and she appeared appropriately groomed.  Pt's  mood and affect were preoccupied.  Pt's speech was normal in rate, rhythm, and volume.  Thought processes were within normal range, and thought content suggested paranoia.  Pt's memory and concentration were intact.  Pt's insight, judgment, and impulse control were fair.  Consulted with Ander Slade, NP, who recommended Pt be observed, stabilized, AM psych eval.  Diagnosis: Schizoaffective Disorder  Past Medical History:  Past Medical History:  Diagnosis Date  . Arthritis   . Depression   . Hallucinations   . Paranoid schizophrenia (HCC)   . Schizoaffective disorder (HCC)     History reviewed. No pertinent surgical history.  Family History: No family history on file.  Social History:  reports that she has never smoked. She has never used smokeless tobacco. She reports previous alcohol use. She reports previous drug use.  Additional Social History:  Alcohol / Drug Use Pain Medications: See MAR Prescriptions: See MAR Over the Counter: See MAR History of alcohol / drug use?: No history of alcohol / drug abuse  CIWA: CIWA-Ar BP: 125/74 Pulse Rate: (!) 108 COWS:    Allergies: No Known Allergies  Home Medications: (Not in a hospital admission)   OB/GYN Status:  Patient's last menstrual period was 03/18/2020.  General Assessment Data Location of Assessment: [facility] ED TTS Assessment: In system Is this a Tele or Face-to-Face Assessment?: Tele Assessment Is this an Initial Assessment or a Re-assessment for this encounter?: Initial Assessment Patient Accompanied by:: N/A Language Other than English: No Living Arrangements: Other (Comment) (Single family  home) What gender do you identify as?: Female Marital status: Single Maiden name: Labo Living Arrangements: Alone Can pt return to current living arrangement?: Yes Admission Status: Involuntary Petitioner: ED Attending Is patient capable of signing voluntary admission?: Yes Referral Source: Self/Family/Friend Insurance type:  [facility]     Crisis Care Plan Living Arrangements: Alone Name of Psychiatrist: [facility] Name of Therapist: [facility]   Education Status Is patient currently in school?: No Is the patient employed, unemployed or receiving disability?: Receiving disability income  Risk to self with the past 6 months Suicidal Ideation: No Has patient been a risk to self within the past 6 months prior to admission? : No Suicidal Intent: No Has patient had any suicidal intent within the past 6 months prior to admission? : No Is patient at risk for suicide?: No Suicidal Plan?: No Has patient had any suicidal plan within the past 6 months prior to admission? : No Access to Means: No What has been your use of drugs/alcohol within the last 12 months?: Denied Previous Attempts/Gestures: Yes How many times?: 3 Triggers for Past Attempts: Unknown Intentional Self Injurious Behavior: None Family Suicide History: No Recent stressful life event(s): Other (Comment) (Hasn't taken medication in two days) Persecutory voices/beliefs?: No (Pt denied) Depression: No Substance abuse history and/or treatment for substance abuse?: No Suicide prevention information given to non-admitted patients: Not applicable  Risk to Others within the past 6 months Homicidal Ideation: No Does patient have any lifetime risk of violence toward others beyond the six months prior to admission? : No Thoughts of Harm to Others: No Current Homicidal Intent: No Current Homicidal Plan: No Access to Homicidal Means: No History of harm to others?: No Assessment of Violence: None Noted Does patient have access to weapons?: No Criminal Charges Pending?: No Does patient have a court date: No Is patient on probation?: No  Psychosis Hallucinations: None noted Delusions: Persecutory  Mental Status Report Appearance/Hygiene: Unremarkable Eye Contact: Good Motor Activity: Freedom of movement, Unremarkable Speech:  Logical/coherent Level of Consciousness: Alert Mood: Preoccupied Affect: Preoccupied Anxiety Level: Moderate Thought Processes: Relevant, Coherent Judgement: Partial Orientation: Person, Place, Time, Situation Obsessive Compulsive Thoughts/Behaviors: None  Cognitive Functioning Concentration: Normal Memory: Remote Intact, Recent Intact Is patient IDD: No Insight: Poor Impulse Control: Fair Appetite: Good Have you had any weight changes? : No Change Sleep: Decreased Total Hours of Sleep:  (''Not sure -- poor'') Vegetative Symptoms: None  ADLScreening [facility]) Patient's cognitive ability adequate to safely complete daily activities?: Yes Patient able to express need for assistance with ADLs?: Yes Independently performs ADLs?: Yes (appropriate for developmental age)  Prior Inpatient Therapy Prior Inpatient Therapy: Yes Prior Therapy Dates: Multiple Prior Therapy Facilty/Provider(s): [facility], [facility] Reason for Treatment: Schizophrenia, paranoid type  Prior Outpatient Therapy Prior Outpatient Therapy: Yes Prior Therapy Dates: Ongoing Prior Therapy Facilty/Provider(s): [facility] Reason for Treatment: Med mang Does patient have an ACCT team?: No Does patient have Intensive In-House Services?  : No Does patient have [facility] services? : No Does patient have P4CC services?: No  ADL Screening (condition at time of admission) Patient's cognitive ability adequate to safely complete daily activities?: Yes Is the patient deaf or have difficulty hearing?: No Does the patient have difficulty seeing, even when wearing glasses/contacts?: No Does the patient have difficulty concentrating, remembering, or making decisions?: No Patient able to express need for assistance with ADLs?: Yes Does the patient have difficulty dressing or bathing?: No Independently performs ADLs?: Yes (appropriate for developmental age)  Does the patient have difficulty walking or  climbing stairs?: No Weakness of Legs: None Weakness of Arms/Hands: None  Home Assistive Devices/Equipment Home Assistive Devices/Equipment: None  Therapy Consults (therapy consults require a physician order) PT Evaluation Needed: No OT Evalulation Needed: No SLP Evaluation Needed: No Abuse/Neglect Assessment (Assessment to be complete while patient is alone) Abuse/Neglect Assessment Can Be Completed: Yes Physical Abuse: Denies Verbal Abuse: Denies Sexual Abuse: Denies Exploitation of patient/patient's resources: Denies Self-Neglect: Denies Values / Beliefs Cultural Requests During Hospitalization: None Spiritual Requests During Hospitalization: None Consults Spiritual Care Consult Needed: No Transition of Care Team Consult Needed: No Advance Directives (For Healthcare) Does Patient Have a Medical Advance Directive?: No          Disposition:  Disposition Initial Assessment Completed for this Encounter: Yes Patient referred to: Other (Comment) (AM psych eval)  This service was provided via telemedicine using a 2-way, interactive audio and video technology.  Names of all persons participating in this telemedicine service and their role in this encounter. Name: Dayja Loveridge Role: Patient             Earline Mayotte 04/18/2020 3:05 PM

## 2020-04-18 NOTE — ED Triage Notes (Signed)
Pt very soft spoken.  Reports "upset stomach" since this morning.  Denies abd pain, vomiting, and diarrhea.  States she only has nausea.

## 2020-04-18 NOTE — ED Notes (Signed)
Pt ambulated to bathroom with EMT and back to room to bed.

## 2020-04-18 NOTE — ED Notes (Signed)
Pt has ate a happy meal, cheese, crackers.  TTS being done now.

## 2020-04-18 NOTE — ED Notes (Signed)
Pt is calm at this time, agreeable to medication IM injections x 2.  Pt cooperative while injections being given.

## 2020-04-18 NOTE — ED Notes (Signed)
Pt denies nausea, pt states "she just doesn't feel good", asked pt to describe pt states "some people are trying to kill me", pt talking softly, looking at wall.  Denies auditory and visual hallucinations.

## 2020-04-18 NOTE — ED Notes (Addendum)
Entered pt room and pt is screaming and crying.  When this nurse asks her why she was screaming pt stated "I'm tortured".  Took few minutes for pt to calm down, not answering questions.

## 2020-04-18 NOTE — ED Notes (Signed)
Per previous RN, 6pm vital signs not obtained to avoid waking sedated pt.

## 2020-04-18 NOTE — ED Notes (Signed)
Pt talking out loud.  States "God is in my head". Drank some water, turned on right side and still talking to self.

## 2020-04-18 NOTE — ED Notes (Signed)
Pt got up from bed and stated "I need to use the bathroom", this nurse escorted pt to bathroom, after few minutes pt came screaming and running down the hall.  Directed pt into room and back to bed.  Pt calmed down, MD at bedside.  See orders.

## 2020-04-18 NOTE — ED Notes (Signed)
Pt agreed to COVID swab, pt cooperative .

## 2020-04-18 NOTE — ED Notes (Signed)
Called for sitter, per staffing no sitter available at this time.

## 2020-04-18 NOTE — ED Notes (Signed)
Belongings bag x 3 put in locker #4 in [place] zone.  Sent phone and clear bag with numerous credit cards, drivers license, other misc cards to [employer] envelope # [phone]

## 2020-04-18 NOTE — ED Notes (Signed)
PT calling Police desk sargent in [place] stating that she needs help. [employer] alerted me of this. I have spoken to PT and stated that she is in a safe place and if she need to talk to PD that we have people here at [facility]. PT stated that she didn't need the police anymore.  PT now screaming out and crying, both myself and Carla (RN) in the room trying to carm PT down. PT continues to scream out for around 5-10 mins.

## 2020-04-18 NOTE — ED Notes (Signed)
[employer] came to ED and served IVC papers. [employer] officer spoke with patient and advised pt is IVC'd.

## 2020-04-19 DIAGNOSIS — F200 Paranoid schizophrenia: Secondary | ICD-10-CM | POA: Diagnosis not present

## 2020-04-19 DIAGNOSIS — F259 Schizoaffective disorder, unspecified: Secondary | ICD-10-CM | POA: Diagnosis not present

## 2020-04-19 NOTE — ED Notes (Signed)
[facility] contacted this RN stating that after reassessment by them, patient is cleared for discharge and does not need any outpatient resources as she has already been provided those, they will be placing a note in chart regarding this. This RN will make EDP aware.

## 2020-04-19 NOTE — Discharge Instructions (Addendum)
Please be sure to schedule follow-up with your physician.  Return here for concerning changes in your condition.

## 2020-04-19 NOTE — ED Provider Notes (Signed)
Per behavioral health patient is appropriate for reversal of her involuntary commitment, discharge, outpatient follow-up.   Gerhard Munch, MD 04/19/20 1058

## 2020-04-19 NOTE — ED Notes (Signed)
Dr Jeraldine Loots, EDP made aware that patient cleared by [facility] and can be discharged by [place] once their note is in.

## 2020-04-19 NOTE — ED Notes (Signed)
IVC rescind paperwork signed by Dr. Jeraldine Loots, EDP and faxed to magistrate by ED secretary.

## 2020-04-19 NOTE — ED Notes (Signed)
Lunch Tray Ordered @ 1043. 

## 2020-04-19 NOTE — Consult Note (Signed)
  Principal Problem: Paranoid schizophrenia [facility]) Discharge Diagnoses: Principal Problem:  Paranoid schizophrenia (HCC)   Subjective: [name] [name], 44 y.o., female patient seen and assessed, chart reviewed on 04/19/2020.  On evaluation patient is observed to be lying in bed, alert and oriented x 3. She is calm and cooperative. Reports that "I came in for paranoia but I'm not feeling that way now. I really was hoping to get my diagnosis changed to depressive disorder."  Patient has a long standing history of schizophrenia and appears to be stable at this time.   During evaluation Brinlee Gambrell is alert/oriented x 4; calm/cooperative; and mood is congruent with affect.  She does not appear to be responding to internal/external stimuli or delusional thoughts at this time.  Patient denies suicidal/self-harm/homicidal ideation, psychosis, and paranoia.  Patient answered question appropriately.    Total Time spent with patient: 20 minutes  Past Psychiatric History: Schizophrenia  Disposition: Discharge home. Referral to ACT team services. Reports current outpatient psychiatric care at [facility].  No evidence of imminent risk to self or others at present.  Patient does not meet criteria for psychiatric inpatient admission. Supportive therapy provided about ongoing stressors. Discussed crisis plan, support from social network, calling 911, coming to the Emergency Department, and calling Suicide Hotline.

## 2020-04-19 NOTE — ED Notes (Signed)
Belongings and valuables returned to patient at this time. Pt attempting to call someone to pick her up at discharge.

## 2020-04-19 NOTE — ED Notes (Signed)
TTS machine placed at bedside for reassessment by [facility]

## 2020-04-23 ENCOUNTER — Encounter (HOSPITAL_COMMUNITY): Payer: Self-pay

## 2020-04-23 ENCOUNTER — Emergency Department (EMERGENCY_DEPARTMENT_HOSPITAL)
Admission: EM | Admit: 2020-04-23 | Discharge: 2020-04-24 | Disposition: A | Discharge status: Home / Self Care | Payer: Medicare Other | Source: Home / Self Care | Attending: Emergency Medicine | Admitting: Emergency Medicine

## 2020-04-23 ENCOUNTER — Encounter (HOSPITAL_COMMUNITY): Payer: Self-pay | Admitting: Emergency Medicine

## 2020-04-23 ENCOUNTER — Emergency Department (HOSPITAL_COMMUNITY)
Admission: EM | Admit: 2020-04-23 | Discharge: 2020-04-23 | Disposition: A | Discharge status: Home / Self Care | Payer: Medicare Other | Attending: Emergency Medicine | Admitting: Emergency Medicine

## 2020-04-23 ENCOUNTER — Other Ambulatory Visit: Payer: Self-pay

## 2020-04-23 DIAGNOSIS — R69 Illness, unspecified: Secondary | ICD-10-CM | POA: Diagnosis not present

## 2020-04-23 DIAGNOSIS — F200 Paranoid schizophrenia: Secondary | ICD-10-CM | POA: Insufficient documentation

## 2020-04-23 DIAGNOSIS — Z5321 Procedure and treatment not carried out due to patient leaving prior to being seen by health care provider: Secondary | ICD-10-CM | POA: Insufficient documentation

## 2020-04-23 DIAGNOSIS — Z20822 Contact with and (suspected) exposure to covid-19: Secondary | ICD-10-CM | POA: Insufficient documentation

## 2020-04-23 DIAGNOSIS — R410 Disorientation, unspecified: Secondary | ICD-10-CM | POA: Diagnosis not present

## 2020-04-23 DIAGNOSIS — Z03818 Encounter for observation for suspected exposure to other biological agents ruled out: Secondary | ICD-10-CM | POA: Diagnosis not present

## 2020-04-23 DIAGNOSIS — R440 Auditory hallucinations: Secondary | ICD-10-CM

## 2020-04-23 DIAGNOSIS — Z046 Encounter for general psychiatric examination, requested by authority: Secondary | ICD-10-CM | POA: Diagnosis not present

## 2020-04-23 DIAGNOSIS — R5381 Other malaise: Secondary | ICD-10-CM | POA: Diagnosis not present

## 2020-04-23 DIAGNOSIS — E162 Hypoglycemia, unspecified: Secondary | ICD-10-CM | POA: Diagnosis not present

## 2020-04-23 DIAGNOSIS — F29 Unspecified psychosis not due to a substance or known physiological condition: Secondary | ICD-10-CM | POA: Insufficient documentation

## 2020-04-23 DIAGNOSIS — E161 Other hypoglycemia: Secondary | ICD-10-CM | POA: Diagnosis not present

## 2020-04-23 DIAGNOSIS — F22 Delusional disorders: Secondary | ICD-10-CM | POA: Diagnosis not present

## 2020-04-23 LAB — BASIC METABOLIC PANEL
Anion gap: 11 (ref 5–15)
BUN: 9 mg/dL (ref 6–20)
CO2: 25 mmol/L (ref 22–32)
Calcium: 9.5 mg/dL (ref 8.9–10.3)
Chloride: 100 mmol/L (ref 98–111)
Creatinine, Ser: 0.6 mg/dL (ref 0.44–1.00)
GFR calc Af Amer: >60 mL/min (ref >60)
GFR calc non Af Amer: >60 mL/min (ref >60)
Glucose, Bld: 79 mg/dL (ref 70–99)
Potassium: 3.3 mmol/L — ABNORMAL LOW (ref 3.5–5.1)
Sodium: 136 mmol/L (ref 135–145)

## 2020-04-23 LAB — CBC WITH DIFFERENTIAL/PLATELET
Abs Immature Granulocytes: 0.01 10*3/uL (ref 0.00–0.07)
Basophils Absolute: 0 10*3/uL (ref 0.0–0.1)
Basophils Relative: 0 %
Eosinophils Absolute: 0.1 10*3/uL (ref 0.0–0.5)
Eosinophils Relative: 1 %
HCT: 34.9 % — ABNORMAL LOW (ref 36.0–46.0)
Hemoglobin: 11.4 g/dL — ABNORMAL LOW (ref 12.0–15.0)
Immature Granulocytes: 0 %
Lymphocytes Relative: 41 %
Lymphs Abs: 2.1 10*3/uL (ref 0.7–4.0)
MCH: 28.9 pg (ref 26.0–34.0)
MCHC: 32.7 g/dL (ref 30.0–36.0)
MCV: 88.4 fL (ref 80.0–100.0)
Monocytes Absolute: 0.5 10*3/uL (ref 0.1–1.0)
Monocytes Relative: 9 %
Neutro Abs: 2.5 10*3/uL (ref 1.7–7.7)
Neutrophils Relative %: 49 %
Platelets: 295 10*3/uL (ref 150–400)
RBC: 3.95 MIL/uL (ref 3.87–5.11)
RDW: 13.4 % (ref 11.5–15.5)
WBC: 5.2 10*3/uL (ref 4.0–10.5)
nRBC: 0 % (ref 0.0–0.2)

## 2020-04-23 LAB — RAPID URINE DRUG SCREEN, HOSP PERFORMED
Amphetamines: NOT DETECTED
Barbiturates: NOT DETECTED
Benzodiazepines: NOT DETECTED
Cocaine: NOT DETECTED
Opiates: NOT DETECTED
Tetrahydrocannabinol: NOT DETECTED

## 2020-04-23 LAB — ETHANOL: Alcohol, Ethyl (B): <10 mg/dL (ref <10)

## 2020-04-23 LAB — I-STAT BETA HCG BLOOD, ED (MC, WL, AP ONLY): I-stat hCG, quantitative: <5 m[IU]/mL (ref <5)

## 2020-04-23 NOTE — BH Assessment (Signed)
Comprehensive Clinical Assessment (CCA) Note  04/23/2020 [name] [name] [id number]  Visit Diagnosis: F20.9, Schizophrenia  Pt is a 44 year old female who was brought to [facility] via EMS due to experiencing delusions while at [facility]. Pt shares, "[name] is trying to kill me--He's been after me all day. He makes me hit myself in the face and calls me all kinds of names, like 'whore,' 'blackie,' 'bitch'... He says He wishes I was dead." Pt shares she has been experiencing this turmoil for years.  Pt denies she is currently experiencing SI, though she acknowledges she took 11 Aleve pills 2 weeks ago, then states she was considering going home today and attempting to engage in the same activity. She states she was last hospitalized at [facility]; clinician viewed pt's chart and pt was at [facility] for three days in January 2021. Pt denies HI, AVH, NSSIB, access to guns/weapons, engagement with the legal system, or SA.  Pt's protective factors include her ability to identify when she needs to come to the hospital, consistent housing, family support, and ongoing mental health care.  Pt was open to providing clinician her sister's number for clinician to speak to her, but pt's sister's phone number was not in her chart and pt did not know what her number is.   Pt's orientation is x5. Her recent and remote memory appears to be intact. Pt was cooperative throughout the assessment process. Pt's insight, judgement, and impulse control is fair to poor at this time.   CCA Screening, Triage and Referral (STR)  Patient Reported Information How did you hear about [place]? Self  Referral name: No data recorded Referral phone number: No data recorded  Whom do you see for routine medical problems? No data recorded Practice/Facility Name: No data recorded Practice/Facility Phone Number: No data recorded Name of Contact: No data recorded Contact Number: No data recorded Contact Fax Number: No data  recorded Prescriber Name: No data recorded Prescriber Address (if known): No data recorded  What Is the Reason for Your Visit/Call Today? Pt is experiencing AH  How Long Has This Been Causing You Problems? > than 6 months (Inpatient hospitalization)  What Do You Feel Would Help You the Most Today? Other (Comment) (Inpatient hospitalization)   Have You Recently Been in Any Inpatient Treatment (Hospital/Detox/Crisis Center/28-Day Program)? Yes  Name/Location of Program/Hospital:[facility]  How Long Were You There? 11/10/2019 - 1/07/20210  When Were You Discharged? 11/13/19   Have You Ever Received Services From [employer] [facility] Before? Yes  Who Do You See at [facility]? Inpatient services - saw Dr. Malvin Johns   Have You Recently Had Any Thoughts About Hurting Yourself? Yes  Are You Planning to Commit Suicide/Harm Yourself At This time? No   Have you Recently Had Thoughts About Hurting Someone [name]? No  Explanation: No data recorded  Have You Used Any Alcohol or Drugs in the Past 24 Hours? No  How Long Ago Did You Use Drugs or Alcohol? No data recorded What Did You Use and How Much? No data recorded  Do You Currently Have a Therapist/Psychiatrist? Yes  Name of Therapist/Psychiatrist: Wynne Dust - pt has been seeing her for one month   Have You Been Recently Discharged From Any Office Practice or Programs? No  Explanation of Discharge From Practice/Program: No data recorded    CCA Screening Triage Referral Assessment Type of Contact: Tele-Assessment  Is this Initial or Reassessment? Initial Assessment  Date Telepsych consult ordered in [facility]:  04/23/20  Time Telepsych consult ordered in [facility]:  2001   Patient Reported Information Reviewed? Yes  Patient Left Without Being Seen? No data recorded Reason for Not Completing Assessment: Cart not available (Staff advised they would set it up when ready)   Collateral Involvement: None - sister's contact information is not  in pt's chart, pt does not have access to her phone with her sister's number in it   Does Patient Have a [place]? No data recorded Name and Contact of Legal Guardian: Self  If Minor and Not Living with Parent(s), Who has Custody? N/A  Is CPS involved or ever been involved? Never  Is APS involved or ever been involved? Never   Patient Determined To Be At Risk for Harm To Self or Others Based on Review of Patient Reported Information or Presenting Complaint? Yes, for Self-Harm  Method: No data recorded Availability of Means: No data recorded Intent: No data recorded Notification Required: No data recorded Additional Information for Danger to Others Potential: No data recorded Additional Comments for Danger to Others Potential: No data recorded Are There Guns or Other Weapons in Your Home? No data recorded Types of Guns/Weapons: No data recorded Are These Weapons Safely Secured?                            No data recorded Who Could Verify You Are Able To Have These Secured: No data recorded Do You Have any Outstanding Charges, Pending Court Dates, Parole/Probation? No data recorded Contacted To Inform of Risk of Harm To Self or Others: No data recorded  Location of Assessment: [facility]   Does Patient Present under Involuntary Commitment? No  IVC Papers Initial File Date: No data recorded  [place] of Residence: [place]   Patient Currently Receiving the Following Services: Medication Management;Individual Therapy   Determination of Need: No data recorded  Options For Referral: Inpatient Hospitalization     CCA Biopsychosocial  Intake/Chief Complaint:  CCA Intake With Chief Complaint CCA Part Two Date: 04/23/20 CCA Part Two Time: 2017 Chief Complaint/Presenting Problem: Pt is currently experiencing AH/delusions Patient's Currently Reported Symptoms/Problems: Pt believes [name] is attempting to kill her Individual's Strengths: Pt was able to  identify she needed to come to the hospital. Pt denies she uses substances. Individual's Preferences: None noted. Individual's Abilities: Pt lives independently. Pt attends therapy and sees a psychiatrist. Type of Services Patient Feels Are Needed: Inpatient hospitalization. Initial Clinical Notes/Concerns: Pt is experiencing delusions that are resulting in thoughts of SI.  Mental Health Symptoms Depression:  Depression: Duration of symptoms greater than two weeks, Difficulty Concentrating  Mania:  Mania: None  Anxiety:   Anxiety: Difficulty concentrating, Worrying  Psychosis:  Psychosis: Delusions, Hallucinations, Duration of symptoms greater than six months  Trauma:  Trauma: None  Obsessions:  Obsessions: Cause anxiety, Disrupts routine/functioning, Poor insight, Recurrent & persistent thoughts/impulses/images  Compulsions:  Compulsions: None  Inattention:  Inattention: Symptoms present in 2 or more settings  Hyperactivity/Impulsivity:  Hyperactivity/Impulsivity: N/A  Oppositional/Defiant Behaviors:  Oppositional/Defiant Behaviors: None  Emotional Irregularity:  Emotional Irregularity: None  Other Mood/Personality Symptoms:      Mental Status Exam Appearance and self-care  Stature:  Stature:  (UTA)  Weight:  Weight:  (UTA)  Clothing:  Clothing: Casual  Grooming:  Grooming: Neglected  Cosmetic use:  Cosmetic Use: None  Posture/gait:  Posture/Gait: Slumped  Motor activity:  Motor Activity: Not Remarkable  Sensorium  Attention:  Attention: Normal  Concentration:  Concentration: Normal  Orientation:  Orientation: X5  Recall/memory:  Recall/Memory: Normal  Affect and Mood  Affect:  Affect: Anxious, Depressed  Mood:  Mood: Anxious, Depressed  Relating  Eye contact:  Eye Contact: Normal  Facial expression:  Facial Expression: Tense, Anxious  Attitude toward examiner:  Attitude Toward Examiner: Cooperative  Thought and Language  Speech flow: Speech Flow: Clear and Coherent   Thought content:  Thought Content: Appropriate to Mood and Circumstances  Preoccupation:  Preoccupations: None  Hallucinations:  Hallucinations: Auditory  Organization:     Company secretary of Knowledge:  Fund of Knowledge: Fair  Intelligence:     Abstraction:     Judgement:  Judgement: Impaired  Reality Testing:     Insight:  Insight: Poor  Decision Making:  Decision Making: Normal  Social Functioning  Social Maturity:     Social Judgement:     Stress  Stressors:     Coping Ability:  Coping Ability: Building surveyor Deficits:     Supports:        Religion:    Leisure/Recreation:    Exercise/Diet:     CCA Employment/Education  Employment/Work Situation:    Education:     CCA Family/Childhood History  Family and Relationship History:    Childhood History:     Child/Adolescent Assessment:     CCA Substance Use  Alcohol/Drug Use: Alcohol / Drug Use Pain Medications: Please see MAR Prescriptions: Please see MAR Over the Counter: Please see MAR History of alcohol / drug use?: No history of alcohol / drug abuse Longest period of sobriety (when/how long): Pt has a hx of SA but denies use at this time                         ASAM's:  Six Dimensions of Multidimensional Assessment  Dimension 1:  Acute Intoxication and/or Withdrawal Potential:      Dimension 2:  Biomedical Conditions and Complications:      Dimension 3:  Emotional, Behavioral, or Cognitive Conditions and Complications:     Dimension 4:  Readiness to Change:     Dimension 5:  Relapse, Continued use, or Continued Problem Potential:     Dimension 6:  Recovery/Living Environment:     ASAM Severity Score:    ASAM Recommended Level of Treatment:     Substance use Disorder (SUD)    Recommendations for Services/Supports/Treatments: [name], NP, reviewed pt's chart and information and determined pt meets criteria for inpatient hospitalization. Pt is currently  under review at [facility]. This information was provided to pt's nurse, Alba Cory, at 2135 and to pt's provider, Dr. Anitra Lauth, at 2137.    DSM5 Diagnoses: Patient Active Problem List   Diagnosis Date Noted  . Paranoid schizophrenia (HCC) 03/27/2020  . Schizophrenia, paranoid type (HCC) 03/20/2020  . Homelessness 07/06/2019  . Hallucinations     Patient Centered Plan: Patient is on the following Treatment Plan(s):  Depression   Referrals to Alternative Service(s): Referred to Alternative Service(s):   Place:   Date:   Time:    Referred to Alternative Service(s):   Place:   Date:   Time:    Referred to Alternative Service(s):   Place:   Date:   Time:    Referred to Alternative Service(s):   Place:   Date:   Time:     Ralph Dowdy

## 2020-04-23 NOTE — ED Triage Notes (Signed)
Pt BIB [facility] for eval of paranoid delusions. States Jesus is trying to harm her. Pt denies SI/HI. Reports compliance w/ her meds. Hx of being seen for same

## 2020-04-23 NOTE — ED Provider Notes (Signed)
[facility]-EMERGENCY DEPT Provider Note   CSN: [id number] Arrival date & time: 04/23/20  1915     History Chief Complaint  Patient presents with  . Medical Clearance    [name] [name] is a 44 y.o. female.  Patient is a 44 year old female with a history of depression, paranoid schizophrenia who is presenting today stating that somebody is trying to kill her.  When I asked the patient who that is she reports its Jesus.  She states for all of her life he has been trying to kill her but he is trying much harder now.  She reports that she hears him talking to her and he tells her to kill herself.  She states she does not want to kill herself and she is trying not to but it is hard to resist the voices.  She does report that she is taking her medication and says her only medical problem is depression.  She does not want to hurt anyone else and states she is not been able to sleep because of the voices.  She denies cough, congestion, fever, chest pain, abdominal pain or urinary symptoms.  The history is provided by the patient.       Past Medical History:  Diagnosis Date  . Arthritis   . Depression   . Hallucinations   . Paranoid schizophrenia (HCC)   . Schizoaffective disorder [facility])     Patient Active Problem List   Diagnosis Date Noted  . Paranoid schizophrenia (HCC) 03/27/2020  . Schizophrenia, paranoid type (HCC) 03/20/2020  . Homelessness 07/06/2019  . Hallucinations     History reviewed. No pertinent surgical history.   OB History   No obstetric history on file.     History reviewed. No pertinent family history.  Social History   Tobacco Use  . Smoking status: Never Smoker  . Smokeless tobacco: Never Used  Vaping Use  . Vaping Use: Never used  Substance Use Topics  . Alcohol use: Not Currently    Comment: She denies   . Drug use: Not Currently    Comment: She denies     Home Medications Prior to Admission medications   Medication Sig  Start Date End Date Taking? Authorizing Provider  benztropine (COGENTIN) 1 MG tablet Take 1 tablet (1 mg total) by mouth 2 (two) times daily. 11/13/19   Malvin Johns, MD  INVEGA SUSTENNA 234 MG/1.5ML SUSY injection Inject 234 mg into the muscle every 28 (twenty-eight) days. 03/24/20   [provider]  risperidone (RISPERDAL) 4 MG tablet Take 1 tablet (4 mg total) by mouth 2 (two) times daily. 11/13/19   Malvin Johns, MD  sertraline (ZOLOFT) 50 MG tablet Take 1 tablet (50 mg total) by mouth daily. 09/22/19   Rankin, Shuvon B, NP  traZODone (DESYREL) 150 MG tablet Take 1 tablet (150 mg total) by mouth at bedtime as needed and may repeat dose one time if needed for sleep. 11/13/19   Malvin Johns, MD    Allergies    Patient has no known allergies.  Review of Systems   Review of Systems  All other systems reviewed and are negative.   Physical Exam Updated Vital Signs BP 121/77   Pulse 70   Temp 97.6 F (36.4 C)   Resp 18   Ht 4\' 11"  (1.499 m)   Wt 49.4 kg   SpO2 100%   BMI 22.02 kg/m   Physical Exam Vitals and nursing note reviewed.  Constitutional:  General: She is not in acute distress.    Appearance: Normal appearance. She is well-developed and normal weight.  HENT:     Head: Normocephalic and atraumatic.  Eyes:     Conjunctiva/sclera: Conjunctivae normal.     Pupils: Pupils are equal, round, and reactive to light.  Cardiovascular:     Rate and Rhythm: Normal rate and regular rhythm.     Heart sounds: No murmur heard.   Pulmonary:     Effort: Pulmonary effort is normal. No respiratory distress.     Breath sounds: Normal breath sounds. No wheezing or rales.  Abdominal:     General: There is no distension.     Palpations: Abdomen is soft.     Tenderness: There is no abdominal tenderness. There is no guarding or rebound.  Musculoskeletal:        General: No tenderness. Normal range of motion.     Cervical back: Normal range of motion and neck supple.  Skin:     General: Skin is warm and dry.     Findings: No erythema or rash.  Neurological:     General: No focal deficit present.     Mental Status: She is alert and oriented to person, place, and time. Mental status is at baseline.  Psychiatric:        Attention and Perception: She perceives auditory hallucinations.        Mood and Affect: Affect is blunt.        Behavior: Behavior is withdrawn.        Thought Content: Thought content is paranoid.        Cognition and Memory: Memory normal.      ED Results / Procedures / Treatments   Labs (all labs ordered are listed, but only abnormal results are displayed) Labs Reviewed  CBC WITH DIFFERENTIAL/PLATELET - Abnormal; Notable for the following components:      Result Value   Hemoglobin 11.4 (*)    HCT 34.9 (*)    All other components within normal limits  BASIC METABOLIC PANEL - Abnormal; Notable for the following components:   Potassium 3.3 (*)    All other components within normal limits  SARS CORONAVIRUS 2 BY RT PCR (HOSPITAL ORDER, [facility])  ETHANOL  RAPID URINE DRUG SCREEN, HOSP PERFORMED  I-STAT BETA HCG BLOOD, ED ([facility], [facility], [facility] ONLY)    EKG None  Radiology No results found.  Procedures Procedures (including critical care time)  Medications Ordered in ED Medications - No data to display  ED Course  I have reviewed the triage vital signs and the nursing notes.  Pertinent labs & imaging results that were available during my care of the patient were reviewed by me and considered in my medical decision making (see chart for details).    MDM Rules/Calculators/A&P                          Patient presenting today stating that Jesus is trying to kill her and that he is telling her to kill herself and the voices are getting louder and she is having a harder time not listening to them.  Patient does have a history of paranoid schizophrenia and is on medications which she reports she is taking.  She  has been evaluated several times recently by behavioral health but at that time was cleared and thought not to be an inpatient candidate.  However based on patient's symptoms today  we will have them reevaluate.  Patient's labs otherwise without acute findings.  She is currently medically clear at this time.  Behavioral health evaluated and feel that she meets inpatient criteria and they are looking for placement.  MDM Number of Diagnoses or Management Options   Amount and/or Complexity of Data Reviewed Clinical lab tests: ordered and reviewed Decide to obtain previous medical records or to obtain history from someone other than the patient: yes Obtain history from someone other than the patient: yes Review and summarize past medical records: yes Discuss the patient with other providers: yes Independent visualization of images, tracings, or specimens: yes  Risk of Complications, Morbidity, and/or Mortality Presenting problems: moderate Diagnostic procedures: low Management options: low  Patient Progress Patient progress: stable  Final Clinical Impression(s) / ED Diagnoses Final diagnoses:  Psychosis, unspecified psychosis type [facility])  Auditory hallucination    Rx / DC Orders ED Discharge Orders    None       Gwyneth Sprout, MD 04/23/20 2141

## 2020-04-23 NOTE — ED Triage Notes (Signed)
Per [facility] to was having delusions. Pt was found at [employer] stating the Jesus wants hurt her.

## 2020-04-24 DIAGNOSIS — F200 Paranoid schizophrenia: Secondary | ICD-10-CM

## 2020-04-24 DIAGNOSIS — Z046 Encounter for general psychiatric examination, requested by authority: Secondary | ICD-10-CM | POA: Diagnosis not present

## 2020-04-24 LAB — SARS CORONAVIRUS 2 BY RT PCR (HOSPITAL ORDER, PERFORMED IN ~~LOC~~ HOSPITAL LAB): SARS Coronavirus 2: NEGATIVE

## 2020-04-24 MED ORDER — BENZTROPINE MESYLATE 1 MG PO TABS
1.0000 mg | ORAL_TABLET | Freq: Two times a day (BID) | ORAL | Status: DC
  Administered 2020-04-24: 1 mg via ORAL
  Filled 2020-04-24: qty 1

## 2020-04-24 MED ORDER — RISPERIDONE 2 MG PO TABS
4.0000 mg | ORAL_TABLET | Freq: Two times a day (BID) | ORAL | Status: DC
  Administered 2020-04-24: 4 mg via ORAL
  Filled 2020-04-24: qty 2

## 2020-04-24 MED ORDER — SERTRALINE HCL 50 MG PO TABS
50.0000 mg | ORAL_TABLET | Freq: Every day | ORAL | Status: DC
  Administered 2020-04-24: 50 mg via ORAL
  Filled 2020-04-24: qty 1

## 2020-04-24 MED ORDER — TRAZODONE HCL 100 MG PO TABS: 150.0000 mg | ORAL_TABLET | Freq: Every evening | ORAL | Status: DC | PRN
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
## 2020-04-24 NOTE — ED Notes (Signed)
Psych team at bedside speaking with patient.

## 2020-04-24 NOTE — ED Notes (Signed)
Patient ambulatory to restroom  ?

## 2020-04-24 NOTE — ED Notes (Signed)
Meal tray given 

## 2020-04-24 NOTE — Discharge Instructions (Signed)
Supporting Someone With Schizophrenia If you are a friend or family member of someone diagnosed with schizophrenia, you can play a helpful role in that person's life. Understanding as much as you can about schizophrenia will help you to be as supportive as possible. What do I need to know about this condition? Schizophrenia is a lifelong mental illness that causes episodes of severe loss of contact with reality (psychosis). It may interfere with personal relationships or normal daily activities. Symptoms of schizophrenia may be continuous, or they may come and go. Some common symptoms include:  Seeing, hearing, or feeling things that are not there (hallucinations).  Having fixed, false beliefs (delusions). These often involve a person believing that he or she is being attacked, harassed, cheated, persecuted, or plotted against.  Speech or behavior that is odd or disorganized or does not make sense to others (incoherent). What do I need to know about the treatment options? Schizophrenia requires long-term treatment. Treatment may include:  Medicines, such as antipsychotics. Other medicines may be added depending on the type and severity of the person's symptoms.  Counseling or talk therapy. Individual, group, or family counseling may be helpful in providing education, support, and guidance. Many people with schizophrenia also benefit from social skills and job skills (vocational) training.  Electroconvulsive therapy (ECT). This procedure involves applying short electrical pulses to the brain through the scalp to change brain chemicals (neurotransmitters). ECT may be used to treat people with severe symptoms if other treatments are not effective. A combination of medicine and counseling is usually best for managing this disorder over time. How can I support my loved one? Talk about the condition Good communication can be helpful in supporting your friend or family member. Here are a few things to  keep in mind:  Be careful about too much prodding. Try not to overdo reminders to an adult friend or family member about things like taking medicines. Ask how your loved one prefers that you help.  Be patient, listen well, and speak encouraging words.  Be available if your loved one wants to talk. Make an effort to acknowledge his or her feelings. Find support and resources A health care provider may be able to recommend mental health resources that are available online or over the phone. You could start with:  Substance Abuse and Mental Health Services Administration (SAMHSA): www.samhsa.gov  National Alliance on Mental Illness (NAMI): www.nami.org Think about joining self-help and support groups, not only for your friend or family member, but also for yourself. People in these peer and family support groups understand what you and your loved one are going through. They can help you feel a sense of hope and connect you with local resources to help you learn more. You could also try family or couples therapy. General support  Make an effort to learn all you can about schizophrenia.  Ask your loved one how you can best help him or her. Offer to help with daily responsibilities, such as laundry or meals.  Help your loved one follow his or her treatment plan as directed by health care providers. This could mean driving him or her to therapy sessions or suggesting ways to cope with stress, such as going for a walk together.  Do not argue with your loved one about his or her perception of reality. How can I create a safe environment?  Create a written crisis plan. Include important phone numbers, such as the local crisis intervention team. Make sure that: ? The person   with schizophrenia knows about this plan. ? Everyone who has regular contact with that person knows about the plan and knows what to do in an emergency.  Be aware that sometimes there is a risk of violence toward you when a  person has schizophrenia. Set limits when it comes to physical or verbal abuse. One thing you could do is tell your loved one that if he or she is getting violent, you will leave and then call the police. Talk ahead of time about your plans for handling violent situations. Doing that will help to make violent episodes easier to manage if they happen.  To lower the risk of violence or suicide during a crisis, remove or lock up guns and other weapons. If you do not have a safe place to keep a gun, local law enforcement may store a gun for you. How should I care for myself? Supporting someone with schizophrenia can cause stress. Be sure to find ways to take care of your body, mind, and well-being.  Find someone you can talk to who will also help you develop coping skills to manage stress.  Try different techniques for coping with stress, such as: ? Muscle relaxation, meditation, and breathing exercises. ? Exercise, even if it is just taking a short walk a few times a week. ? Getting the right amount of good-quality sleep.  Try to maintain your normal routines. This can help you remember that your life is about more than your loved one's condition.  Make time for activities that help you relax, and try to not feel guilty about taking time for yourself. What are some signs that the condition is getting worse? Warning signs that your loved one's condition is getting worse include:  Clear, sudden change in behavior or symptoms.  Struggling with daily activities.  Increased use of drugs or alcohol.  Not being able to fulfill basic needs (food, clothing, shelter). Warning signs that your loved one is thinking about suicide include:  Withdrawing from friends and family.  Talking about suicide or searching for methods.  Expressing feelings of being trapped or not having a purpose in life. Ask a counselor or your loved one's health care provider about when to get help if you are concerned about  behavior changes. Privacy laws limit how much a health care provider can share with you without your loved one's permission, but if you feel that a situation is an emergency, do not wait to call a health care provider or emergency services. Get help right away if:  You are in a situation that threatens your life. Leave the situation and call emergency services (911 in the [place] as soon as possible. If you ever feel like your loved one may hurt himself or herself or others, or may have thoughts about taking his or her own life, get help right away. You can go to your nearest emergency department or call:  Your local emergency services (911 in the [place]  A suicide crisis helpline, such as the National Suicide Prevention Lifeline at 1-800-273-8255. This is open 24 hours a day. Summary  Schizophrenia is a lifelong illness that causes episodes of severe loss of contact with reality (psychosis).  Treatment for schizophrenia is ongoing, and it may include medicines, counseling, therapy, support groups, and social skills or job skills training.  Support that you give to your loved one is an important part of a successful treatment plan.  Make sure to take care of yourself while supporting someone with   schizophrenia. Get help when you need it.  Expect episodes, and have a plan for how to help your loved one manage them. If you feel threatened or are worried about your loved one's safety, contact your local emergency services as soon as possible. This information is not intended to replace advice given to you by your health care provider. Make sure you discuss any questions you have with your health care provider. Document Revised: 02/13/2019 Document Reviewed: 03/06/2017 Elsevier Patient Education  2020 Elsevier Inc.  

## 2020-04-27 DIAGNOSIS — Z03818 Encounter for observation for suspected exposure to other biological agents ruled out: Secondary | ICD-10-CM | POA: Diagnosis not present

## 2020-04-30 DIAGNOSIS — Z03818 Encounter for observation for suspected exposure to other biological agents ruled out: Secondary | ICD-10-CM | POA: Diagnosis not present

## 2020-05-02 ENCOUNTER — Emergency Department (HOSPITAL_COMMUNITY)
Admission: EM | Admit: 2020-05-02 | Discharge: 2020-05-03 | Disposition: A | Discharge status: Other Acute Inpatient Hospital | Payer: Medicare Other | Attending: Emergency Medicine | Admitting: Emergency Medicine

## 2020-05-02 ENCOUNTER — Other Ambulatory Visit: Payer: Self-pay

## 2020-05-02 ENCOUNTER — Encounter (HOSPITAL_COMMUNITY): Payer: Self-pay | Admitting: *Deleted

## 2020-05-02 DIAGNOSIS — Z20822 Contact with and (suspected) exposure to covid-19: Secondary | ICD-10-CM | POA: Diagnosis not present

## 2020-05-02 DIAGNOSIS — F259 Schizoaffective disorder, unspecified: Secondary | ICD-10-CM | POA: Diagnosis not present

## 2020-05-02 DIAGNOSIS — F29 Unspecified psychosis not due to a substance or known physiological condition: Secondary | ICD-10-CM | POA: Diagnosis not present

## 2020-05-02 DIAGNOSIS — F22 Delusional disorders: Secondary | ICD-10-CM | POA: Insufficient documentation

## 2020-05-02 DIAGNOSIS — R440 Auditory hallucinations: Secondary | ICD-10-CM

## 2020-05-02 DIAGNOSIS — R5381 Other malaise: Secondary | ICD-10-CM | POA: Diagnosis not present

## 2020-05-02 DIAGNOSIS — R45851 Suicidal ideations: Secondary | ICD-10-CM | POA: Insufficient documentation

## 2020-05-02 DIAGNOSIS — Z8659 Personal history of other mental and behavioral disorders: Secondary | ICD-10-CM

## 2020-05-02 DIAGNOSIS — Z03818 Encounter for observation for suspected exposure to other biological agents ruled out: Secondary | ICD-10-CM | POA: Diagnosis not present
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
## 2020-05-02 NOTE — ED Provider Notes (Signed)
[name] EM[facility]GENCY DE[facility]MENT Provider Note   CSN: [id number] Arrival date & time: 05/02/20  2313     History Chief Complaint  Patient presents with  . Psychiatric Evaluation    [name] [name] is a 44 y.o. female.  Patient with history of paranoid schizophrenia presents to ED with [facility] after they were called by the patient stating "I need help". She reports she is hearing the voice of God telling her to kill herself. She denies HI. Per [facility], as they were leaving to come to the [name], the patient attempted to lock herself in her apartment, ran into the kitchen and put a number of Aleve in her mouth, after which she spit them out without swallowing them. She arrived to ED in restraints which were removed. The patient states she wants to stay for help voluntarily.  The history is provided by the patient and the police. No language interpreter was used.       Past Medical History:  Diagnosis Date  . Arthritis   . Depression   . Hallucinations   . Paranoid schizophrenia (HCC)   . Schizoaffective disorder [facility])     Patient Active Problem List   Diagnosis Date Noted  . Schizophrenia, paranoid type (HCC) 03/20/2020  . Homelessness 07/06/2019  . Hallucinations     History reviewed. No pertinent surgical history.   OB History   No obstetric history on file.     No family history on file.  Social History   Tobacco Use  . Smoking status: Never Smoker  . Smokeless tobacco: Never Used  Vaping Use  . Vaping Use: Never used  Substance Use Topics  . Alcohol use: Not Currently    Comment: She denies   . Drug use: Not Currently    Comment: She denies     Home Medications Prior to Admission medications   Medication Sig Start Date End Date Taking? Authorizing Provider  benztropine (COGENTIN) 1 MG tablet Take 1 tablet (1 mg total) by mouth 2 (two) times daily. 11/13/19   Malvin Johns, MD  INVEGA SUSTENNA 234 MG/1.5ML SUSY injection Inject 234 mg into the  muscle every 28 (twenty-eight) days. 03/24/20   [provider]  risperidone (RISPERDAL) 4 MG tablet Take 1 tablet (4 mg total) by mouth 2 (two) times daily. 11/13/19   Malvin Johns, MD  sertraline (ZOLOFT) 50 MG tablet Take 1 tablet (50 mg total) by mouth daily. 09/22/19   Rankin, Shuvon B, NP  traZODone (DESYREL) 150 MG tablet Take 1 tablet (150 mg total) by mouth at bedtime as needed and may repeat dose one time if needed for sleep. 11/13/19   Malvin Johns, MD    Allergies    Patient has no known allergies.  Review of Systems   Review of Systems  Constitutional: Negative for chills and fever.  HENT: Negative.   Respiratory: Negative.   Cardiovascular: Negative.   Gastrointestinal: Negative.   Musculoskeletal: Negative.   Skin: Negative.   Neurological: Negative.   Psychiatric/Behavioral: Positive for hallucinations and suicidal ideas.    Physical Exam Updated Vital Signs Ht 4\' 11"  (1.499 m)   Wt 49.4 kg   BMI 22.00 kg/m   Physical Exam Vitals and nursing note reviewed.  Constitutional:      Appearance: She is well-developed.  HENT:     Head: Normocephalic.  Cardiovascular:     Rate and Rhythm: Normal rate and regular rhythm.  Pulmonary:     Effort: Pulmonary effort is normal.  Breath sounds: Normal breath sounds. No wheezing, rhonchi or rales.  Abdominal:     General: Bowel sounds are normal.     Palpations: Abdomen is soft.     Tenderness: There is no abdominal tenderness. There is no guarding or rebound.  Musculoskeletal:        General: Normal range of motion.     Cervical back: Normal range of motion and neck supple.  Skin:    General: Skin is warm and dry.     Findings: No rash.  Neurological:     Mental Status: She is alert and oriented to person, place, and time.  Psychiatric:        Attention and Perception: She perceives auditory hallucinations. She does not perceive visual hallucinations.        Mood and Affect: Affect is flat.        Speech:  Speech is delayed.        Behavior: Behavior is cooperative.        Thought Content: Thought content is delusional. Thought content includes suicidal ideation.     ED Results / Procedures / Treatments   Labs (all labs ordered are listed, but only abnormal results are displayed) Labs Reviewed - No data to display  EKG None  Radiology No results found.  Procedures Procedures (including critical care time)  Medications Ordered in ED Medications - No data to display  ED Course  I have reviewed the triage vital signs and the nursing notes.  Pertinent labs & imaging results that were available during my care of the patient were reviewed by me and considered in my medical decision making (see chart for details).    MDM Rules/Calculators/A&P                          Patient to ED with [employer] for command auditory hallucinations of the voice of God telling her to kill herself. She arrived restrained but is calm and cooperative on arrival and wants to stay voluntarily.   IVC paperwork on hold, to file in the event the patient attempts to discharge herself.  Labs reviewed. She is considered medically cleared for psychiatric assessment.   Final Clinical Impression(s) / ED Diagnoses Final diagnoses:  None   1. Auditory hallucinations 2. History of schizophrenia  Rx / DC Orders ED Discharge Orders    None       Dennie Bible 05/03/20 1700    Mesner, Corene Cornea, MD 05/03/20 [phone]

## 2020-05-03 ENCOUNTER — Ambulatory Visit (HOSPITAL_COMMUNITY)
Admission: EM | Admit: 2020-05-03 | Discharge: 2020-05-04 | Disposition: A | Discharge status: Home / Self Care | Payer: Medicare Other | Attending: Nurse Practitioner | Admitting: Nurse Practitioner

## 2020-05-03 ENCOUNTER — Other Ambulatory Visit: Payer: Self-pay

## 2020-05-03 ENCOUNTER — Encounter (HOSPITAL_COMMUNITY): Payer: Self-pay

## 2020-05-03 DIAGNOSIS — Z915 Personal history of self-harm: Secondary | ICD-10-CM | POA: Insufficient documentation

## 2020-05-03 DIAGNOSIS — Z79899 Other long term (current) drug therapy: Secondary | ICD-10-CM | POA: Diagnosis not present

## 2020-05-03 DIAGNOSIS — F200 Paranoid schizophrenia: Secondary | ICD-10-CM | POA: Diagnosis not present

## 2020-05-03 DIAGNOSIS — F203 Undifferentiated schizophrenia: Secondary | ICD-10-CM | POA: Diagnosis not present

## 2020-05-03 LAB — COMPREHENSIVE METABOLIC PANEL
ALT: 16 U/L (ref 0–44)
AST: 20 U/L (ref 15–41)
Albumin: 3.7 g/dL (ref 3.5–5.0)
Alkaline Phosphatase: 39 U/L (ref 38–126)
Anion gap: 10 (ref 5–15)
BUN: 9 mg/dL (ref 6–20)
CO2: 23 mmol/L (ref 22–32)
Calcium: 9.1 mg/dL (ref 8.9–10.3)
Chloride: 104 mmol/L (ref 98–111)
Creatinine, Ser: 0.65 mg/dL (ref 0.44–1.00)
GFR calc Af Amer: >60 mL/min (ref >60)
GFR calc non Af Amer: >60 mL/min (ref >60)
Glucose, Bld: 123 mg/dL — ABNORMAL HIGH (ref 70–99)
Potassium: 3.4 mmol/L — ABNORMAL LOW (ref 3.5–5.1)
Sodium: 137 mmol/L (ref 135–145)
Total Bilirubin: 0.5 mg/dL (ref 0.3–1.2)
Total Protein: 6.6 g/dL (ref 6.5–8.1)

## 2020-05-03 LAB — RAPID URINE DRUG SCREEN, HOSP PERFORMED
Amphetamines: NOT DETECTED
Barbiturates: NOT DETECTED
Benzodiazepines: NOT DETECTED
Cocaine: NOT DETECTED
Opiates: NOT DETECTED
Tetrahydrocannabinol: NOT DETECTED

## 2020-05-03 LAB — CBC
HCT: 34.7 % — ABNORMAL LOW (ref 36.0–46.0)
Hemoglobin: 10.8 g/dL — ABNORMAL LOW (ref 12.0–15.0)
MCH: 27.8 pg (ref 26.0–34.0)
MCHC: 31.1 g/dL (ref 30.0–36.0)
MCV: 89.2 fL (ref 80.0–100.0)
Platelets: 289 10*3/uL (ref 150–400)
RBC: 3.89 MIL/uL (ref 3.87–5.11)
RDW: 13.6 % (ref 11.5–15.5)
WBC: 4.9 10*3/uL (ref 4.0–10.5)
nRBC: 0 % (ref 0.0–0.2)

## 2020-05-03 LAB — I-STAT BETA HCG BLOOD, ED (MC, WL, AP ONLY): I-stat hCG, quantitative: <5 m[IU]/mL (ref <5)

## 2020-05-03 LAB — SALICYLATE LEVEL: Salicylate Lvl: <7 mg/dL — ABNORMAL LOW (ref 7.0–30.0)

## 2020-05-03 LAB — ETHANOL: Alcohol, Ethyl (B): <10 mg/dL (ref <10)

## 2020-05-03 LAB — ACETAMINOPHEN LEVEL: Acetaminophen (Tylenol), Serum: <10 ug/mL — ABNORMAL LOW (ref 10–30)

## 2020-05-03 LAB — SARS CORONAVIRUS 2 BY RT PCR (HOSPITAL ORDER, PERFORMED IN ~~LOC~~ HOSPITAL LAB): SARS Coronavirus 2: NEGATIVE

## 2020-05-03 MED ORDER — MAGNESIUM HYDROXIDE 400 MG/5ML PO SUSP: 30.0000 mL | Freq: Every day | ORAL | Status: DC | PRN

## 2020-05-03 MED ORDER — RISPERIDONE 2 MG PO TABS
4.0000 mg | ORAL_TABLET | Freq: Two times a day (BID) | ORAL | Status: DC
  Administered 2020-05-03 – 2020-05-04 (×3): 4 mg via ORAL
  Filled 2020-05-03 (×3): qty 2

## 2020-05-03 MED ORDER — LORAZEPAM 1 MG PO TABS: 1.0000 mg | ORAL_TABLET | Freq: Once | ORAL | Status: DC

## 2020-05-03 MED ORDER — HYDROXYZINE HCL 25 MG PO TABS: 25.0000 mg | ORAL_TABLET | Freq: Three times a day (TID) | ORAL | Status: DC | PRN

## 2020-05-03 MED ORDER — ALUM & MAG HYDROXIDE-SIMETH 200-200-20 MG/5ML PO SUSP: 30.0000 mL | ORAL | Status: DC | PRN

## 2020-05-03 MED ORDER — SERTRALINE HCL 50 MG PO TABS
50.0000 mg | ORAL_TABLET | Freq: Every day | ORAL | Status: DC
  Administered 2020-05-03 – 2020-05-04 (×2): 50 mg via ORAL
  Filled 2020-05-03 (×2): qty 1

## 2020-05-03 MED ORDER — PALIPERIDONE PALMITATE ER 156 MG/ML IM SUSY
156.0000 mg | PREFILLED_SYRINGE | Freq: Once | INTRAMUSCULAR | Status: AC
  Administered 2020-05-03: 156 mg via INTRAMUSCULAR
  Filled 2020-05-03: qty 1

## 2020-05-03 MED ORDER — ACETAMINOPHEN 325 MG PO TABS: 650.0000 mg | ORAL_TABLET | Freq: Four times a day (QID) | ORAL | Status: DC | PRN

## 2020-05-03 MED ORDER — TRAZODONE HCL 150 MG PO TABS: 150.0000 mg | ORAL_TABLET | Freq: Every evening | ORAL | Status: DC | PRN

## 2020-05-03 NOTE — ED Provider Notes (Signed)
Behavioral Health Admission H&P [facility] & OBS)  Date: 05/03/20 Patient Name: [name] [name] MRN: [id number] Chief Complaint: No chief complaint on file.     Diagnoses:  Final diagnoses:  None    HPI: Patient with history of paranoid schizophrenia presents to ED with [employer] after they were called by the patient stating "I need help". She reports she is hearing the voice of God telling her to kill herself. She denies HI. Per [employer], as they were leaving to come to the hospital, the patient attempted to lock herself in her apartment, ran into the kitchen and put a number of Aleve in her mouth, after which she spit them out without swallowing them. She arrived to ED in restraints which were removed. The patient states she wants to stay for help voluntarily.     PHQ 2-9:     ED from 05/02/2020 in [facility] EMERGENCY DEPARTMENT ED from 04/23/2020 in [facility] EMERGENCY DEPARTMENT Admission (Discharged) from OP Visit from 03/27/2020 in BEHAVIORAL HEALTH OBSERVATION UNIT  C-SSRS RISK CATEGORY High Risk No Risk High Risk       Total Time spent with patient: 30 minutes  Musculoskeletal  Strength & Muscle Tone: within normal limits Gait & Station: normal Patient leans: N/A  Psychiatric Specialty Exam  Presentation General Appearance: Appropriate for Environment;Casual;Fairly Groomed  Eye Contact:Good  Speech:Blocked;Normal Rate  Speech Volume:Normal  Handedness:Right   Mood and Affect  Mood:Hopeless;Worthless  Affect:Full Range;Flat   Thought Tourist information centre manager  Descriptions of Associations:Loose  Orientation:Full (Time, Place and Person)  Thought Content:Paranoid Ideation;Delusions;Rumination;Scattered  Hallucinations:Hallucinations: Auditory Description of Auditory Hallucinations: hearing jesus "he tells me I am ugly and he hates me because Im rich"  Ideas of Reference:Delusions  Suicidal Thoughts:Suicidal  Thoughts: No  Homicidal Thoughts:Homicidal Thoughts: No   Sensorium  Memory:Immediate Fair;Remote Fair  Judgment:Poor  Insight:Present   Executive Functions  Concentration:Fair  Attention Span:Fair  Recall:Poor  Fund of Knowledge:Fair  Language:Poor   Psychomotor Activity  Psychomotor Activity:Psychomotor Activity: Restlessness   Assets  Assets:Physical Health;Desire for Improvement;Social Support   Sleep  Sleep:Sleep: Poor   Physical Exam ROS  There were no vitals taken for this visit. There is no height or weight on file to calculate BMI.  Past Psychiatric History:  Schizophrenia,. Currently taking Risperdal  po BID, Zoloft  po daily. She is receiving wrap around services at [facility] (ACT team services). SHe denies any recent suicide attempts at this time. She has mutliple ED admissions and observations. Last inpatient admission was 11/2019.   Is the patient at risk to self? Yes  Has the patient been a risk to self in the past 6 months? No .    Has the patient been a risk to self within the distant past? No   Is the patient a risk to others? No   Has the patient been a risk to others in the past 6 months? No   Has the patient been a risk to others within the distant past? No   Past Medical History:  Past Medical History:  Diagnosis Date  . Arthritis   . Depression   . Hallucinations   . Paranoid schizophrenia (HCC)   . Schizoaffective disorder (HCC)    No past surgical history on file.  Family History: No family history on file.  Social History:  Social History   Socioeconomic History  . Marital status: Single    Spouse name: Not on file  . Number of children: Not  on file  . Years of education: Not on file  . Highest education level: Not on file  Occupational History  . Occupation: Disability  Tobacco Use  . Smoking status: Never Smoker  . Smokeless tobacco: Never Used  Vaping Use  . Vaping Use: Never used  Substance and  Sexual Activity  . Alcohol use: Not Currently    Comment: She denies   . Drug use: Not Currently    Comment: She denies   . Sexual activity: Not Currently  Other Topics Concern  . Not on file  Social History Narrative   Pt stated that she lives in [place], and that she lives alone.  Pt receives outpatient psychiatric resources through [facility].   Social Determinants of Health   Financial Resource Strain:   . Difficulty of Paying Living Expenses:   Food Insecurity:   . Worried About Programme researcher, broadcasting/film/video in the Last Year:   . Barista in the Last Year:   Transportation Needs:   . Freight forwarder (Medical):   Marland Kitchen Lack of Transportation (Non-Medical):   Physical Activity:   . Days of Exercise per Week:   . Minutes of Exercise per Session:   Stress:   . Feeling of Stress :   Social Connections:   . Frequency of Communication with Friends and Family:   . Frequency of Social Gatherings with Friends and Family:   . Attends Religious Services:   . Active Member of Clubs or Organizations:   . Attends Banker Meetings:   Marland Kitchen Marital Status:   Intimate Partner Violence:   . Fear of Current or Ex-Partner:   . Emotionally Abused:   Marland Kitchen Physically Abused:   . Sexually Abused:     SDOH:  SDOH Screenings   Alcohol Screen: Low Risk   . Last Alcohol Screening Score (AUDIT): 0  Depression (PHQ2-9):   . PHQ-2 Score:   Financial Resource Strain:   . Difficulty of Paying Living Expenses:   Food Insecurity:   . Worried About Programme researcher, broadcasting/film/video in the Last Year:   . [employer] of Food in the Last Year:   Housing:   . Last Housing Risk Score:   Physical Activity:   . Days of Exercise per Week:   . Minutes of Exercise per Session:   Social Connections:   . Frequency of Communication with Friends and Family:   . Frequency of Social Gatherings with Friends and Family:   . Attends Religious Services:   . Active Member of Clubs or Organizations:   . Attends  Banker Meetings:   Marland Kitchen Marital Status:   Stress:   . Feeling of Stress :   Tobacco Use: Low Risk   . Smoking Tobacco Use: Never Smoker  . Smokeless Tobacco Use: Never Used  Transportation Needs:   . Freight forwarder (Medical):   Marland Kitchen Lack of Transportation (Non-Medical):     Last Labs:  Admission on 05/02/2020, Discharged on 05/03/2020  Component Date Value Ref Range Status  . Sodium 05/03/2020 137  135 - 145 mmol/L Final  . Potassium 05/03/2020 3.4* 3.5 - 5.1 mmol/L Final  . Chloride 05/03/2020 104  98 - 111 mmol/L Final  . CO2 05/03/2020 23  22 - 32 mmol/L Final  . Glucose, Bld 05/03/2020 123* 70 - 99 mg/dL Final   Glucose reference range applies only to samples taken after fasting for at least 8 hours.  . BUN 05/03/2020 9  6 - 20 mg/dL Final  . Creatinine, Ser 05/03/2020 0.65  0.44 - 1.00 mg/dL Final  . Calcium 16/10/960406/28/2021 9.1  8.9 - 10.3 mg/dL Final  . Total Protein 05/03/2020 6.6  6.5 - 8.1 g/dL Final  . Albumin 54/09/811906/28/2021 3.7  3.5 - 5.0 g/dL Final  . AST 14/78/295606/28/2021 20  15 - 41 U/L Final  . ALT 05/03/2020 16  0 - 44 U/L Final  . Alkaline Phosphatase 05/03/2020 39  38 - 126 U/L Final  . Total Bilirubin 05/03/2020 0.5  0.3 - 1.2 mg/dL Final  . GFR calc non Af Amer 05/03/2020 >60  >60 mL/min Final  . GFR calc Af Amer 05/03/2020 >60  >60 mL/min Final  . Anion gap 05/03/2020 10  5 - 15 Final   Performed at [facility], [address] [place], [place] [address]  . Alcohol, Ethyl (B) 05/03/2020 <10  <10 mg/dL Final   Comment: (NOTE) Lowest detectable limit for serum alcohol is 10 mg/dL.  For medical purposes only. Performed at [facility], [address] [place], [place] [address]   . Salicylate Lvl 05/03/2020 <7.0* 7.0 - 30.0 mg/dL Final   Performed at [facility], [address] [place], [place] [address]  . Acetaminophen (Tylenol), Serum 05/03/2020 <10* 10 - 30 ug/mL Final   Comment: (NOTE) Therapeutic concentrations vary  significantly. A range of 10-30 ug/mL  may be an effective concentration for many patients. However, some  are best treated at concentrations outside of this range. Acetaminophen concentrations >150 ug/mL at 4 hours after ingestion  and >50 ug/mL at 12 hours after ingestion are often associated with  toxic reactions.  Performed at [facility], [address] [place], [place] [address]   . WBC 05/03/2020 4.9  4.0 - 10.5 K/uL Final  . RBC 05/03/2020 3.89  3.87 - 5.11 MIL/uL Final  . Hemoglobin 05/03/2020 10.8* 12.0 - 15.0 g/dL Final  . HCT 32/44/010206/28/2021 34.7* 36 - 46 % Final  . MCV 05/03/2020 89.2  80.0 - 100.0 fL Final  . MCH 05/03/2020 27.8  26.0 - 34.0 pg Final  . MCHC 05/03/2020 31.1  30.0 - 36.0 g/dL Final  . RDW 72/53/664406/28/2021 13.6  11.5 - 15.5 % Final  . Platelets 05/03/2020 289  150 - 400 K/uL Final  . nRBC 05/03/2020 0.0  0.0 - 0.2 % Final   Performed at [facility], [address] [place], [place] [address]  . Opiates 05/02/2020 NONE DETECTED  NONE DETECTED Final  . Cocaine 05/02/2020 NONE DETECTED  NONE DETECTED Final  . Benzodiazepines 05/02/2020 NONE DETECTED  NONE DETECTED Final  . Amphetamines 05/02/2020 NONE DETECTED  NONE DETECTED Final  . Tetrahydrocannabinol 05/02/2020 NONE DETECTED  NONE DETECTED Final  . Barbiturates 05/02/2020 NONE DETECTED  NONE DETECTED Final   Comment: (NOTE) DRUG SCREEN FOR MEDICAL PURPOSES ONLY.  IF CONFIRMATION IS NEEDED FOR ANY PURPOSE, NOTIFY LAB WITHIN 5 DAYS.  LOWEST DETECTABLE LIMITS FOR URINE DRUG SCREEN Drug Class                     Cutoff (ng/mL) Amphetamine and metabolites    1000 Barbiturate and metabolites    200 Benzodiazepine                 200 Tricyclics and metabolites     300 Opiates and metabolites        300 Cocaine and metabolites  300 THC                            50 Performed at [facility], [address], [place], [place] [address]   . I-stat hCG, quantitative 05/03/2020 <5.0  <5  mIU/mL Final  . Comment 3 05/03/2020          Final   Comment:   GEST. AGE      CONC.  (mIU/mL)   <=1 WEEK        5 - 50     2 WEEKS       50 - 500     3 WEEKS       100 - 10,000     4 WEEKS     1,000 - 30,000        FEMALE AND NON-PREGNANT FEMALE:     LESS THAN 5 mIU/mL   . SARS Coronavirus 2 05/03/2020 NEGATIVE  NEGATIVE Final   Comment: SARS-CoV-2 target nucleic acids are NOT DETECTED. The SARS-CoV-2 RNA is generally detectable in upper and lower respiratory specimens during the acute phase of infection. The lowest concentration of SARS-CoV-2 viral copies this assay can detect is 250 copies / mL. A negative result does not preclude SARS-CoV-2 infection and should not be used as the sole basis for treatment or other patient management decisions. Performed at [facility], [address], [place], [place] [address]   Admission on 04/23/2020, Discharged on 04/24/2020  Component Date Value Ref Range Status  . WBC 04/23/2020 5.2  4.0 - 10.5 K/uL Final  . RBC 04/23/2020 3.95  3.87 - 5.11 MIL/uL Final  . Hemoglobin 04/23/2020 11.4* 12.0 - 15.0 g/dL Final  . HCT 09/81/1914 34.9* 36 - 46 % Final  . MCV 04/23/2020 88.4  80.0 - 100.0 fL Final  . MCH 04/23/2020 28.9  26.0 - 34.0 pg Final  . MCHC 04/23/2020 32.7  30.0 - 36.0 g/dL Final  . RDW 78/29/5621 13.4  11.5 - 15.5 % Final  . Platelets 04/23/2020 295  150 - 400 K/uL Final  . nRBC 04/23/2020 0.0  0.0 - 0.2 % Final  . Neutrophils Relative % 04/23/2020 49  % Final  . Neutro Abs 04/23/2020 2.5  1.7 - 7.7 K/uL Final  . Lymphocytes Relative 04/23/2020 41  % Final  . Lymphs Abs 04/23/2020 2.1  0.7 - 4.0 K/uL Final  . Monocytes Relative 04/23/2020 9  % Final  . Monocytes Absolute 04/23/2020 0.5  0 - 1 K/uL Final  . Eosinophils Relative 04/23/2020 1  % Final  . Eosinophils Absolute 04/23/2020 0.1  0 - 0 K/uL Final  . Basophils Relative 04/23/2020 0  % Final  . Basophils Absolute 04/23/2020 0.0  0 - 0 K/uL Final  . Immature Granulocytes  04/23/2020 0  % Final  . Abs Immature Granulocytes 04/23/2020 0.01  0.00 - 0.07 K/uL Final   Performed at [facility], [address], [place], [place] [address]  . Sodium 04/23/2020 136  135 - 145 mmol/L Final  . Potassium 04/23/2020 3.3* 3.5 - 5.1 mmol/L Final  . Chloride 04/23/2020 100  98 - 111 mmol/L Final  . CO2 04/23/2020 25  22 - 32 mmol/L Final  . Glucose, Bld 04/23/2020 79  70 - 99 mg/dL Final   Glucose reference range applies only to samples taken after fasting for at least 8 hours.  . BUN 04/23/2020 9  6 -  20 mg/dL Final  . Creatinine, Ser 04/23/2020 0.60  0.44 - 1.00 mg/dL Final  . Calcium 16/08/9603 9.5  8.9 - 10.3 mg/dL Final  . GFR calc non Af Amer 04/23/2020 >60  >60 mL/min Final  . GFR calc Af Amer 04/23/2020 >60  >60 mL/min Final  . Anion gap 04/23/2020 11  5 - 15 Final   Performed at [facility], [address], [place], [place] [address]  . Alcohol, Ethyl (B) 04/23/2020 <10  <10 mg/dL Final   Comment: (NOTE) Lowest detectable limit for serum alcohol is 10 mg/dL.  For medical purposes only. Performed at [facility], [address], [place], [place] [address]   . Opiates 04/23/2020 NONE DETECTED  NONE DETECTED Final  . Cocaine 04/23/2020 NONE DETECTED  NONE DETECTED Final  . Benzodiazepines 04/23/2020 NONE DETECTED  NONE DETECTED Final  . Amphetamines 04/23/2020 NONE DETECTED  NONE DETECTED Final  . Tetrahydrocannabinol 04/23/2020 NONE DETECTED  NONE DETECTED Final  . Barbiturates 04/23/2020 NONE DETECTED  NONE DETECTED Final   Comment: (NOTE) DRUG SCREEN FOR MEDICAL PURPOSES ONLY.  IF CONFIRMATION IS NEEDED FOR ANY PURPOSE, NOTIFY LAB WITHIN 5 DAYS.  LOWEST DETECTABLE LIMITS FOR URINE DRUG SCREEN Drug Class                     Cutoff (ng/mL) Amphetamine and metabolites    1000 Barbiturate and metabolites    200 Benzodiazepine                 200 Tricyclics and metabolites     300 Opiates and  metabolites        300 Cocaine and metabolites        300 THC                            50 Performed at [facility], [address], [place], [place] [address]   . SARS Coronavirus 2 04/23/2020 NEGATIVE  NEGATIVE Final   Comment: (NOTE) SARS-CoV-2 target nucleic acids are NOT DETECTED.  The SARS-CoV-2 RNA is generally detectable in upper and lower respiratory specimens during the acute phase of infection. The lowest concentration of SARS-CoV-2 viral copies this assay can detect is 250 copies / mL. A negative result does not preclude SARS-CoV-2 infection and should not be used as the sole basis for treatment or other patient management decisions.  A negative result may occur with improper specimen collection / handling, submission of specimen other than nasopharyngeal swab, presence of viral mutation(s) within the areas targeted by this assay, and inadequate number of viral copies (<250 copies / mL). A negative result must be combined with clinical observations, patient history, and epidemiological information.  Fact Sheet for Patients:   BoilerBrush.com.cy  Fact Sheet for Healthcare Providers: https://pope.com/  This test is not yet approved or                           cleared by the [place] FDA and has been authorized for detection and/or diagnosis of SARS-CoV-2 by FDA under an Emergency Use Authorization (EUA).  This EUA will remain in effect (meaning this test can be used) for the duration of the COVID-19 declaration under Section 564(b)(1) of the Act, 21 U.S.C. section 360bbb-3(b)(1), unless the authorization is terminated or revoked sooner.  Performed at [facility], [address] [place], [place] [address]   .  I-stat hCG, quantitative 04/23/2020 <5.0  <5 mIU/mL Final  . Comment 3 04/23/2020          Final   Comment:   GEST. AGE      CONC.  (mIU/mL)   <=1 WEEK        5 -  50     2 WEEKS       50 - 500     3 WEEKS       100 - 10,000     4 WEEKS     1,000 - 30,000        FEMALE AND NON-PREGNANT FEMALE:     LESS THAN 5 mIU/mL   Admission on 04/18/2020, Discharged on 04/19/2020  Component Date Value Ref Range Status  . Lipase 04/18/2020 29  11 - 51 U/L Final   Performed at [facility], [address], [place], [place] [address]  . Sodium 04/18/2020 139  135 - 145 mmol/L Final  . Potassium 04/18/2020 3.7  3.5 - 5.1 mmol/L Final  . Chloride 04/18/2020 105  98 - 111 mmol/L Final  . CO2 04/18/2020 23  22 - 32 mmol/L Final  . Glucose, Bld 04/18/2020 105* 70 - 99 mg/dL Final   Glucose reference range applies only to samples taken after fasting for at least 8 hours.  . BUN 04/18/2020 12  6 - 20 mg/dL Final  . Creatinine, Ser 04/18/2020 0.65  0.44 - 1.00 mg/dL Final  . Calcium 60/45/4098 9.7  8.9 - 10.3 mg/dL Final  . Total Protein 04/18/2020 7.3  6.5 - 8.1 g/dL Final  . Albumin 11/91/4782 4.0  3.5 - 5.0 g/dL Final  . AST 95/62/1308 22  15 - 41 U/L Final  . ALT 04/18/2020 16  0 - 44 U/L Final  . Alkaline Phosphatase 04/18/2020 45  38 - 126 U/L Final  . Total Bilirubin 04/18/2020 0.7  0.3 - 1.2 mg/dL Final  . GFR calc non Af Amer 04/18/2020 >60  >60 mL/min Final  . GFR calc Af Amer 04/18/2020 >60  >60 mL/min Final  . Anion gap 04/18/2020 11  5 - 15 Final   Performed at [facility], [address], [place], [place] [address]  . WBC 04/18/2020 4.7  4.0 - 10.5 K/uL Final  . RBC 04/18/2020 4.40  3.87 - 5.11 MIL/uL Final  . Hemoglobin 04/18/2020 12.4  12.0 - 15.0 g/dL Final  . HCT 69/62/9528 39.3  36 - 46 % Final  . MCV 04/18/2020 89.3  80.0 - 100.0 fL Final  . MCH 04/18/2020 28.2  26.0 - 34.0 pg Final  . MCHC 04/18/2020 31.6  30.0 - 36.0 g/dL Final  . RDW 41/32/4401 13.6  11.5 - 15.5 % Final  . Platelets 04/18/2020 309  150 - 400 K/uL Final  . nRBC 04/18/2020 0.0  0.0 - 0.2 % Final   Performed at [facility], [address],  [place], [place] [address]  . Color, Urine 04/18/2020 STRAW* YELLOW Final  . APPearance 04/18/2020 CLEAR  CLEAR Final  . Specific Gravity, Urine 04/18/2020 1.003* 1.005 - 1.030 Final  . pH 04/18/2020 6.0  5.0 - 8.0 Final  . Glucose, UA 04/18/2020 NEGATIVE  NEGATIVE mg/dL Final  . Hgb urine dipstick 04/18/2020 SMALL* NEGATIVE Final  . Bilirubin Urine 04/18/2020 NEGATIVE  NEGATIVE Final  . Ketones, ur 04/18/2020 NEGATIVE  NEGATIVE mg/dL Final  . Protein, ur 36/64/4034 NEGATIVE  NEGATIVE mg/dL Final  . Nitrite 74/25/9563 NEGATIVE  NEGATIVE Final  . Leukocytes,Ua 04/18/2020 TRACE* NEGATIVE Final  . RBC / HPF 04/18/2020 0-5  0 - 5 RBC/hpf Final  . WBC, UA 04/18/2020 0-5  0 - 5 WBC/hpf Final  . Bacteria, UA 04/18/2020 RARE* NONE SEEN Final  . Squamous Epithelial / LPF 04/18/2020 6-10  0 - 5 Final   Performed at [facility], [address], [place], [place] [address]  . I-stat hCG, quantitative 04/18/2020 <5.0  <5 mIU/mL Final  . Comment 3 04/18/2020          Final   Comment:   GEST. AGE      CONC.  (mIU/mL)   <=1 WEEK        5 - 50     2 WEEKS       50 - 500     3 WEEKS       100 - 10,000     4 WEEKS     1,000 - 30,000        FEMALE AND NON-PREGNANT FEMALE:     LESS THAN 5 mIU/mL   . Alcohol, Ethyl (B) 04/18/2020 <10  <10 mg/dL Final   Comment: (NOTE) Lowest detectable limit for serum alcohol is 10 mg/dL.  For medical purposes only. Performed at [facility], [address] [place], [place] [address]   . Opiates 04/18/2020 NONE DETECTED  NONE DETECTED Final  . Cocaine 04/18/2020 NONE DETECTED  NONE DETECTED Final  . Benzodiazepines 04/18/2020 NONE DETECTED  NONE DETECTED Final  . Amphetamines 04/18/2020 NONE DETECTED  NONE DETECTED Final  . Tetrahydrocannabinol 04/18/2020 NONE DETECTED  NONE DETECTED Final  . Barbiturates 04/18/2020 NONE DETECTED  NONE DETECTED Final   Comment: (NOTE) DRUG SCREEN FOR MEDICAL PURPOSES ONLY.  IF CONFIRMATION IS NEEDED FOR ANY PURPOSE,  NOTIFY LAB WITHIN 5 DAYS.  LOWEST DETECTABLE LIMITS FOR URINE DRUG SCREEN Drug Class                     Cutoff (ng/mL) Amphetamine and metabolites    1000 Barbiturate and metabolites    200 Benzodiazepine                 200 Tricyclics and metabolites     300 Opiates and metabolites        300 Cocaine and metabolites        300 THC                            50 Performed at [facility], [address], [place], [place] [address]   . SARS Coronavirus 2 04/18/2020 NEGATIVE  NEGATIVE Final   Comment: (NOTE) SARS-CoV-2 target nucleic acids are NOT DETECTED.  The SARS-CoV-2 RNA is generally detectable in upper and lower respiratory specimens during the acute phase of infection. The lowest concentration of SARS-CoV-2 viral copies this assay can detect is 250 copies / mL. A negative result does not preclude SARS-CoV-2 infection and should not be used as the sole basis for treatment or other patient management decisions.  A negative result may occur with improper specimen collection / handling, submission of specimen other than nasopharyngeal swab, presence of viral mutation(s) within the areas targeted by this assay, and inadequate number of viral copies (<250 copies / mL). A negative result must be combined with clinical observations, patient history, and epidemiological information.  Fact Sheet for Patients:   BoilerBrush.com.cy  Fact Sheet for Healthcare  Providers: https://pope.com/  This test is not yet approved or                           cleared by the [place] and has been authorized for detection and/or diagnosis of SARS-CoV-2 by FDA under an Emergency Use Authorization (EUA).  This EUA will remain in effect (meaning this test can be used) for the duration of the COVID-19 declaration under Section 564(b)(1) of the Act, 21 U.S.C. section 360bbb-3(b)(1), unless the authorization is terminated or revoked  sooner.  Performed at [facility], [address] [place], [place] [address]   Admission on 03/27/2020, Discharged on 03/28/2020  Component Date Value Ref Range Status  . SARS Coronavirus 2 03/27/2020 NEGATIVE  NEGATIVE Final   Comment: (NOTE) SARS-CoV-2 target nucleic acids are NOT DETECTED. The SARS-CoV-2 RNA is generally detectable in upper and lower respiratory specimens during the acute phase of infection. The lowest concentration of SARS-CoV-2 viral copies this assay can detect is 250 copies / mL. A negative result does not preclude SARS-CoV-2 infection and should not be used as the sole basis for treatment or other patient management decisions.  A negative result may occur with improper specimen collection / handling, submission of specimen other than nasopharyngeal swab, presence of viral mutation(s) within the areas targeted by this assay, and inadequate number of viral copies (<250 copies / mL). A negative result must be combined with clinical observations, patient history, and epidemiological information. Fact Sheet for Patients:   BoilerBrush.com.cy Fact Sheet for Healthcare Providers: https://pope.com/ This test is not yet approved or cleared                           by the [place] FDA and has been authorized for detection and/or diagnosis of SARS-CoV-2 by FDA under an Emergency Use Authorization (EUA).  This EUA will remain in effect (meaning this test can be used) for the duration of the COVID-19 declaration under Section 564(b)(1) of the Act, 21 U.S.C. section 360bbb-3(b)(1), unless the authorization is terminated or revoked sooner. Performed at [facility], [address] [place], [place] [address]   Admission on 03/20/2020, Discharged on 03/21/2020  Component Date Value Ref Range Status  . SARS Coronavirus 2 03/20/2020 NEGATIVE  NEGATIVE Final   Comment: (NOTE) SARS-CoV-2  target nucleic acids are NOT DETECTED. The SARS-CoV-2 RNA is generally detectable in upper and lower respiratory specimens during the acute phase of infection. The lowest concentration of SARS-CoV-2 viral copies this assay can detect is 250 copies / mL. A negative result does not preclude SARS-CoV-2 infection and should not be used as the sole basis for treatment or other patient management decisions.  A negative result may occur with improper specimen collection / handling, submission of specimen other than nasopharyngeal swab, presence of viral mutation(s) within the areas targeted by this assay, and inadequate number of viral copies (<250 copies / mL). A negative result must be combined with clinical observations, patient history, and epidemiological information. Fact Sheet for Patients:   BoilerBrush.com.cy Fact Sheet for Healthcare Providers: https://pope.com/ This test is not yet approved or cleared                           by the [place] FDA and has been authorized for detection and/or diagnosis of SARS-CoV-2 by FDA under an Emergency Use Authorization (  EUA).  This EUA will remain in effect (meaning this test can be used) for the duration of the COVID-19 declaration under Section 564(b)(1) of the Act, 21 U.S.C. section 360bbb-3(b)(1), unless the authorization is terminated or revoked sooner. Performed at [facility], [address] [place], [place] [address]   Admission on 03/10/2020, Discharged on 03/10/2020  Component Date Value Ref Range Status  . Sodium 03/10/2020 138  135 - 145 mmol/L Final  . Potassium 03/10/2020 3.8  3.5 - 5.1 mmol/L Final  . Chloride 03/10/2020 105  98 - 111 mmol/L Final  . CO2 03/10/2020 25  22 - 32 mmol/L Final  . Glucose, Bld 03/10/2020 100* 70 - 99 mg/dL Final   Glucose reference range applies only to samples taken after fasting for at least 8 hours.  . BUN 03/10/2020 13   6 - 20 mg/dL Final  . Creatinine, Ser 03/10/2020 0.71  0.44 - 1.00 mg/dL Final  . Calcium 60/45/4098 9.3  8.9 - 10.3 mg/dL Final  . Total Protein 03/10/2020 6.6  6.5 - 8.1 g/dL Final  . Albumin 11/91/4782 3.8  3.5 - 5.0 g/dL Final  . AST 95/62/1308 17  15 - 41 U/L Final  . ALT 03/10/2020 16  0 - 44 U/L Final  . Alkaline Phosphatase 03/10/2020 48  38 - 126 U/L Final  . Total Bilirubin 03/10/2020 0.2* 0.3 - 1.2 mg/dL Final  . GFR calc non Af Amer 03/10/2020 >60  >60 mL/min Final  . GFR calc Af Amer 03/10/2020 >60  >60 mL/min Final  . Anion gap 03/10/2020 8  5 - 15 Final   Performed at [facility], [address] [place], [place] [address]  . Alcohol, Ethyl (B) 03/10/2020 <10  <10 mg/dL Final   Comment: (NOTE) Lowest detectable limit for serum alcohol is 10 mg/dL. For medical purposes only. Performed at [facility], [address], [place], [place] [address]   . Salicylate Lvl 03/10/2020 <7.0* 7.0 - 30.0 mg/dL Final   Performed at [facility], [address], [place], [place] [address]  . Acetaminophen (Tylenol), Serum 03/10/2020 <10* 10 - 30 ug/mL Final   Comment: (NOTE) Therapeutic concentrations vary significantly. A range of 10-30 ug/mL  may be an effective concentration for many patients. However, some  are best treated at concentrations outside of this range. Acetaminophen concentrations >150 ug/mL at 4 hours after ingestion  and >50 ug/mL at 12 hours after ingestion are often associated with  toxic reactions. Performed at [facility], [address], [place], [place] [address]   . WBC 03/10/2020 7.0  4.0 - 10.5 K/uL Final  . RBC 03/10/2020 4.18  3.87 - 5.11 MIL/uL Final  . Hemoglobin 03/10/2020 11.9* 12.0 - 15.0 g/dL Final  . HCT 09/02/2535 38.3  36 - 46 % Final  . MCV 03/10/2020 91.6  80.0 - 100.0 fL Final  . MCH 03/10/2020 28.5  26.0 - 34.0 pg Final  . MCHC 03/10/2020 31.1  30.0 - 36.0 g/dL Final  . RDW 64/40/3474 13.3  11.5 - 15.5 % Final   . Platelets 03/10/2020 306  150 - 400 K/uL Final  . nRBC 03/10/2020 0.0  0.0 - 0.2 % Final   Performed at [facility], [address] [place], [place] [address]  . SARS Coronavirus 2 by RT PCR 03/10/2020 NEGATIVE  NEGATIVE Final   Comment: (NOTE) SARS-CoV-2 target nucleic acids are NOT DETECTED. The SARS-CoV-2 RNA is generally  detectable in upper respiratoy specimens during the acute phase of infection. The lowest concentration of SARS-CoV-2 viral copies this assay can detect is 131 copies/mL. A negative result does not preclude SARS-Cov-2 infection and should not be used as the sole basis for treatment or other patient management decisions. A negative result may occur with  improper specimen collection/handling, submission of specimen other than nasopharyngeal swab, presence of viral mutation(s) within the areas targeted by this assay, and inadequate number of viral copies (<131 copies/mL). A negative result must be combined with clinical observations, patient history, and epidemiological information. The expected result is Negative. Fact Sheet for Patients:  https://www.moore.com/ Fact Sheet for Healthcare Providers:  https://www.young.biz/ This test is not yet ap                          proved or cleared by the [place] FDA and  has been authorized for detection and/or diagnosis of SARS-CoV-2 by FDA under an Emergency Use Authorization (EUA). This EUA will remain  in effect (meaning this test can be used) for the duration of the COVID-19 declaration under Section 564(b)(1) of the Act, 21 U.S.C. section 360bbb-3(b)(1), unless the authorization is terminated or revoked sooner.   . Influenza A by PCR 03/10/2020 NEGATIVE  NEGATIVE Final  . Influenza B by PCR 03/10/2020 NEGATIVE  NEGATIVE Final   Comment: (NOTE) The Xpert Xpress SARS-CoV-2/FLU/RSV assay is intended as an aid in  the diagnosis of influenza from Nasopharyngeal swab  specimens and  should not be used as a sole basis for treatment. Nasal washings and  aspirates are unacceptable for Xpert Xpress SARS-CoV-2/FLU/RSV  testing. Fact Sheet for Patients: https://www.moore.com/ Fact Sheet for Healthcare Providers: https://www.young.biz/ This test is not yet approved or cleared by the [place] FDA and  has been authorized for detection and/or diagnosis of SARS-CoV-2 by  FDA under an Emergency Use Authorization (EUA). This EUA will remain  in effect (meaning this test can be used) for the duration of the  Covid-19 declaration under Section 564(b)(1) of the Act, 21  U.S.C. section 360bbb-3(b)(1), unless the authorization is  terminated or revoked. Performed at [facility], [address] [place], [place] [address]   Admission on 02/14/2020, Discharged on 02/14/2020  Component Date Value Ref Range Status  . I-stat hCG, quantitative 02/14/2020 <5.0  <5 mIU/mL Final  . Comment 3 02/14/2020          Final   Comment:   GEST. AGE      CONC.  (mIU/mL)   <=1 WEEK        5 - 50     2 WEEKS       50 - 500     3 WEEKS       100 - 10,000     4 WEEKS     1,000 - 30,000        FEMALE AND NON-PREGNANT FEMALE:     LESS THAN 5 mIU/mL   . Sodium 02/14/2020 139  135 - 145 mmol/L Final  . Potassium 02/14/2020 3.5  3.5 - 5.1 mmol/L Final  . Chloride 02/14/2020 101  98 - 111 mmol/L Final  . BUN 02/14/2020 9  6 - 20 mg/dL Final  . Creatinine, Ser 02/14/2020 0.60  0.44 - 1.00 mg/dL Final  . Glucose, Bld 19/14/7829 81  70 - 99 mg/dL Final   Glucose reference range applies only to samples taken after fasting for at least 8  hours.  . Calcium, Ion 02/14/2020 1.25  1.15 - 1.40 mmol/L Final  . TCO2 02/14/2020 27  22 - 32 mmol/L Final  . Hemoglobin 02/14/2020 12.6  12.0 - 15.0 g/dL Final  . HCT 16/08/9603 37.0  36 - 46 % Final  Admission on 12/30/2019, Discharged on 12/30/2019  Component Date Value Ref Range Status  . WBC  12/30/2019 4.8  4.0 - 10.5 K/uL Final  . RBC 12/30/2019 3.98  3.87 - 5.11 MIL/uL Final  . Hemoglobin 12/30/2019 11.5* 12.0 - 15.0 g/dL Final  . HCT 54/07/8118 36.0  36 - 46 % Final  . MCV 12/30/2019 90.5  80.0 - 100.0 fL Final  . MCH 12/30/2019 28.9  26.0 - 34.0 pg Final  . MCHC 12/30/2019 31.9  30.0 - 36.0 g/dL Final  . RDW 14/78/2956 14.3  11.5 - 15.5 % Final  . Platelets 12/30/2019 281  150 - 400 K/uL Final  . nRBC 12/30/2019 0.0  0.0 - 0.2 % Final  . Neutrophils Relative % 12/30/2019 58  % Final  . Neutro Abs 12/30/2019 2.8  1.7 - 7.7 K/uL Final  . Lymphocytes Relative 12/30/2019 30  % Final  . Lymphs Abs 12/30/2019 1.4  0.7 - 4.0 K/uL Final  . Monocytes Relative 12/30/2019 9  % Final  . Monocytes Absolute 12/30/2019 0.4  0 - 1 K/uL Final  . Eosinophils Relative 12/30/2019 2  % Final  . Eosinophils Absolute 12/30/2019 0.1  0 - 0 K/uL Final  . Basophils Relative 12/30/2019 0  % Final  . Basophils Absolute 12/30/2019 0.0  0 - 0 K/uL Final  . Immature Granulocytes 12/30/2019 1  % Final  . Abs Immature Granulocytes 12/30/2019 0.05  0.00 - 0.07 K/uL Final   Performed at [facility], [address], [place], [place] [address]  . Sodium 12/30/2019 142  135 - 145 mmol/L Final  . Potassium 12/30/2019 3.7  3.5 - 5.1 mmol/L Final  . Chloride 12/30/2019 107  98 - 111 mmol/L Final  . CO2 12/30/2019 26  22 - 32 mmol/L Final  . Glucose, Bld 12/30/2019 119* 70 - 99 mg/dL Final   Glucose reference range applies only to samples taken after fasting for at least 8 hours.  . BUN 12/30/2019 12  6 - 20 mg/dL Final  . Creatinine, Ser 12/30/2019 0.65  0.44 - 1.00 mg/dL Final  . Calcium 65/78/4696 9.5  8.9 - 10.3 mg/dL Final  . Total Protein 12/30/2019 7.4  6.5 - 8.1 g/dL Final  . Albumin 29/52/8413 4.2  3.5 - 5.0 g/dL Final  . AST 24/40/1027 19  15 - 41 U/L Final  . ALT 12/30/2019 15  0 - 44 U/L Final  . Alkaline Phosphatase 12/30/2019 44  38 - 126 U/L Final  . Total Bilirubin  12/30/2019 0.4  0.3 - 1.2 mg/dL Final  . GFR calc non Af Amer 12/30/2019 >60  >60 mL/min Final  . GFR calc Af Amer 12/30/2019 >60  >60 mL/min Final  . Anion gap 12/30/2019 9  5 - 15 Final   Performed at [facility], [address] [place], [place] [address]  . Lipase 12/30/2019 34  11 - 51 U/L Final   Performed at [facility], [address] [place], [place] [address]  . Preg Test, Ur 12/30/2019 NEGATIVE  NEGATIVE Final   Comment:        THE SENSITIVITY OF THIS METHODOLOGY IS >20 mIU/mL. Performed at [facility],  [place] [address] [place], [place] [address]   . Color, Urine 12/30/2019 STRAW* YELLOW Final  . APPearance 12/30/2019 CLEAR  CLEAR Final  . Specific Gravity, Urine 12/30/2019 1.004* 1.005 - 1.030 Final  . pH 12/30/2019 7.0  5.0 - 8.0 Final  . Glucose, UA 12/30/2019 NEGATIVE  NEGATIVE mg/dL Final  . Hgb urine dipstick 12/30/2019 NEGATIVE  NEGATIVE Final  . Bilirubin Urine 12/30/2019 NEGATIVE  NEGATIVE Final  . Ketones, ur 12/30/2019 NEGATIVE  NEGATIVE mg/dL Final  . Protein, ur 12/30/2019 NEGATIVE  NEGATIVE mg/dL Final  . Nitrite 12/30/2019 NEGATIVE  NEGATIVE Final  . Chalmers Guest 12/30/2019 TRACE* NEGATIVE Final  . RBC / HPF 12/30/2019 0-5  0 - 5 RBC/hpf Final  . WBC, UA 12/30/2019 0-5  0 - 5 WBC/hpf Final  . Bacteria, UA 12/30/2019 RARE* NONE SEEN Final  . Squamous Epithelial / LPF 12/30/2019 0-5  0 - 5 Final   Performed at [facility], [place] [address] [place], [place] [address]  Admission on 11/30/2019, Discharged on 11/30/2019  Component Date Value Ref Range Status  . SARS Coronavirus 2 by RT PCR 11/30/2019 NEGATIVE  NEGATIVE Final   Comment: (NOTE) SARS-CoV-2 target nucleic acids are NOT DETECTED. The SARS-CoV-2 RNA is generally detectable in upper respiratoy specimens during the acute phase of infection. The lowest concentration of SARS-CoV-2 viral copies this assay can detect is 131  copies/mL. A negative result does not preclude SARS-Cov-2 infection and should not be used as the sole basis for treatment or other patient management decisions. A negative result may occur with  improper specimen collection/handling, submission of specimen other than nasopharyngeal swab, presence of viral mutation(s) within the areas targeted by this assay, and inadequate number of viral copies (<131 copies/mL). A negative result must be combined with clinical observations, patient history, and epidemiological information. The expected result is Negative. Fact Sheet for Patients:  PinkCheek.be Fact Sheet for Healthcare Providers:  GravelBags.it This test is not yet ap                          proved or cleared by the [place] FDA and  has been authorized for detection and/or diagnosis of SARS-CoV-2 by FDA under an Emergency Use Authorization (EUA). This EUA will remain  in effect (meaning this test can be used) for the duration of the COVID-19 declaration under Section 564(b)(1) of the Act, 21 U.S.C. section 360bbb-3(b)(1), unless the authorization is terminated or revoked sooner.   . Influenza A by PCR 11/30/2019 NEGATIVE  NEGATIVE Final  . Influenza B by PCR 11/30/2019 NEGATIVE  NEGATIVE Final   Comment: (NOTE) The Xpert Xpress SARS-CoV-2/FLU/RSV assay is intended as an aid in  the diagnosis of influenza from Nasopharyngeal swab specimens and  should not be used as a sole basis for treatment. Nasal washings and  aspirates are unacceptable for Xpert Xpress SARS-CoV-2/FLU/RSV  testing. Fact Sheet for Patients: PinkCheek.be Fact Sheet for Healthcare Providers: GravelBags.it This test is not yet approved or cleared by the [place] FDA and  has been authorized for detection and/or diagnosis of SARS-CoV-2 by  FDA under an Emergency Use Authorization (EUA). This  EUA will remain  in effect (meaning this test can be used) for the duration of the  Covid-19 declaration under Section 564(b)(1) of the Act, 21  U.S.C. section 360bbb-3(b)(1), unless the authorization is  terminated or revoked. Performed at [facility], [place] [address] [place], [place] [address]   .  Sodium 11/30/2019 138  135 - 145 mmol/L Final  . Potassium 11/30/2019 3.5  3.5 - 5.1 mmol/L Final  . Chloride 11/30/2019 102  98 - 111 mmol/L Final  . CO2 11/30/2019 26  22 - 32 mmol/L Final  . Glucose, Bld 11/30/2019 77  70 - 99 mg/dL Final  . BUN 36/62/9476 14  6 - 20 mg/dL Final  . Creatinine, Ser 11/30/2019 0.59  0.44 - 1.00 mg/dL Final  . Calcium 54/65/0354 9.3  8.9 - 10.3 mg/dL Final  . Total Protein 11/30/2019 7.2  6.5 - 8.1 g/dL Final  . Albumin 65/68/1275 4.2  3.5 - 5.0 g/dL Final  . AST 17/00/1749 15  15 - 41 U/L Final  . ALT 11/30/2019 10  0 - 44 U/L Final  . Alkaline Phosphatase 11/30/2019 42  38 - 126 U/L Final  . Total Bilirubin 11/30/2019 0.6  0.3 - 1.2 mg/dL Final  . GFR calc non Af Amer 11/30/2019 >60  >60 mL/min Final  . GFR calc Af Amer 11/30/2019 >60  >60 mL/min Final  . Anion gap 11/30/2019 10  5 - 15 Final   Performed at [facility], [address], [place], [place] [address]  . Alcohol, Ethyl (B) 11/30/2019 <10  <10 mg/dL Final   Comment: (NOTE) Lowest detectable limit for serum alcohol is 10 mg/dL. For medical purposes only. Performed at [facility], [address] [place], [place] [address]   . Opiates 11/30/2019 NONE DETECTED  NONE DETECTED Final  . Cocaine 11/30/2019 NONE DETECTED  NONE DETECTED Final  . Benzodiazepines 11/30/2019 NONE DETECTED  NONE DETECTED Final  . Amphetamines 11/30/2019 NONE DETECTED  NONE DETECTED Final  . Tetrahydrocannabinol 11/30/2019 NONE DETECTED  NONE DETECTED Final  . Barbiturates 11/30/2019 NONE DETECTED  NONE DETECTED Final   Comment: (NOTE) DRUG SCREEN FOR  MEDICAL PURPOSES ONLY.  IF CONFIRMATION IS NEEDED FOR ANY PURPOSE, NOTIFY LAB WITHIN 5 DAYS. LOWEST DETECTABLE LIMITS FOR URINE DRUG SCREEN Drug Class                     Cutoff (ng/mL) Amphetamine and metabolites    1000 Barbiturate and metabolites    200 Benzodiazepine                 200 Tricyclics and metabolites     300 Opiates and metabolites        300 Cocaine and metabolites        300 THC                            50 Performed at [facility], [address] [place], [place] [address]   . WBC 11/30/2019 4.5  4.0 - 10.5 K/uL Final  . RBC 11/30/2019 3.78* 3.87 - 5.11 MIL/uL Final  . Hemoglobin 11/30/2019 10.9* 12.0 - 15.0 g/dL Final  . HCT 99/35/7017 33.9* 36 - 46 % Final  . MCV 11/30/2019 89.7  80.0 - 100.0 fL Final  . MCH 11/30/2019 28.8  26.0 - 34.0 pg Final  . MCHC 11/30/2019 32.2  30.0 - 36.0 g/dL Final  . RDW 79/39/0300 14.3  11.5 - 15.5 % Final  . Platelets 11/30/2019 303  150 - 400 K/uL Final  . nRBC 11/30/2019 0.0  0.0 - 0.2 % Final  . Neutrophils Relative % 11/30/2019 68  % Final  . Neutro Abs 11/30/2019 3.1  1.7 - 7.7 K/uL Final  .  Lymphocytes Relative 11/30/2019 24  % Final  . Lymphs Abs 11/30/2019 1.1  0.7 - 4.0 K/uL Final  . Monocytes Relative 11/30/2019 8  % Final  . Monocytes Absolute 11/30/2019 0.4  0 - 1 K/uL Final  . Eosinophils Relative 11/30/2019 0  % Final  . Eosinophils Absolute 11/30/2019 0.0  0 - 0 K/uL Final  . Basophils Relative 11/30/2019 0  % Final  . Basophils Absolute 11/30/2019 0.0  0 - 0 K/uL Final  . Immature Granulocytes 11/30/2019 0  % Final  . Abs Immature Granulocytes 11/30/2019 0.01  0.00 - 0.07 K/uL Final   Performed at [facility], [address], [place], [place] [address]  . I-stat hCG, quantitative 11/30/2019 <5.0  <5 mIU/mL Final  . Comment 3 11/30/2019          Final   Comment:   GEST. AGE      CONC.  (mIU/mL)   <=1 WEEK        5 - 50     2 WEEKS       50 - 500     3 WEEKS        100 - 10,000     4 WEEKS     1,000 - 30,000        FEMALE AND NON-PREGNANT FEMALE:     LESS THAN 5 mIU/mL   . Acetaminophen (Tylenol), Serum 11/30/2019 <10* 10 - 30 ug/mL Final   Comment: (NOTE) Therapeutic concentrations vary significantly. A range of 10-30 ug/mL  may be an effective concentration for many patients. However, some  are best treated at concentrations outside of this range. Acetaminophen concentrations >150 ug/mL at 4 hours after ingestion  and >50 ug/mL at 12 hours after ingestion are often associated with  toxic reactions. Performed at [facility], [address] [place], [place] [address]   . Salicylate Lvl 11/30/2019 <7.0* 7.0 - 30.0 mg/dL Final   Performed at [facility], [address] [place], [place] [address]  Admission on 11/10/2019, Discharged on 11/13/2019  Component Date Value Ref Range Status  . SARS Coronavirus 2 by RT PCR 11/10/2019 NEGATIVE  NEGATIVE Final   Comment: (NOTE) SARS-CoV-2 target nucleic acids are NOT DETECTED. The SARS-CoV-2 RNA is generally detectable in upper respiratoy specimens during the acute phase of infection. The lowest concentration of SARS-CoV-2 viral copies this assay can detect is 131 copies/mL. A negative result does not preclude SARS-Cov-2 infection and should not be used as the sole basis for treatment or other patient management decisions. A negative result may occur with  improper specimen collection/handling, submission of specimen other than nasopharyngeal swab, presence of viral mutation(s) within the areas targeted by this assay, and inadequate number of viral copies (<131 copies/mL). A negative result must be combined with clinical observations, patient history, and epidemiological information. The expected result is Negative. Fact Sheet for Patients:  https://www.moore.com/ Fact Sheet for Healthcare Providers:   https://www.young.biz/ This test is not yet ap                          proved or cleared by the [place] FDA and  has been authorized for detection and/or diagnosis of SARS-CoV-2 by FDA under an Emergency Use Authorization (EUA). This EUA will remain  in effect (meaning this test can be used) for the duration of the COVID-19 declaration under Section 564(b)(1) of the Act, 21 U.S.C. section 360bbb-3(b)(1), unless the  authorization is terminated or revoked sooner.   . Influenza A by PCR 11/10/2019 NEGATIVE  NEGATIVE Final  . Influenza B by PCR 11/10/2019 NEGATIVE  NEGATIVE Final   Comment: (NOTE) The Xpert Xpress SARS-CoV-2/FLU/RSV assay is intended as an aid in  the diagnosis of influenza from Nasopharyngeal swab specimens and  should not be used as a sole basis for treatment. Nasal washings and  aspirates are unacceptable for Xpert Xpress SARS-CoV-2/FLU/RSV  testing. Fact Sheet for Patients: https://www.moore.com/ Fact Sheet for Healthcare Providers: https://www.young.biz/ This test is not yet approved or cleared by the [place] FDA and  has been authorized for detection and/or diagnosis of SARS-CoV-2 by  FDA under an Emergency Use Authorization (EUA). This EUA will remain  in effect (meaning this test can be used) for the duration of the  Covid-19 declaration under Section 564(b)(1) of the Act, 21  U.S.C. section 360bbb-3(b)(1), unless the authorization is  terminated or revoked. Performed at [facility], [address] [place], [place] [address]   There may be more visits with results that are not included.    Allergies: Patient has no known allergies.  PTA Medications: (Not in a hospital admission)   Medical Decision Making  Patient has been medically cleared. WIll admit to the [facility] for continuous observation. Will resume home medications. SHe has been compliant on Risperdal. She was  previously stable on LAI [place] however she is no longer on this medications. Based on the chronicity of her mental illness and repeated admissions to the ER patient will benefit from ongoing LAI for stabilization of schizophrenia. WIll initate [place]  im in a single dose.     Recommendations  Based on my evaluation the patient does not appear to have an emergency medical condition.  Maryagnes Amos, FNP 05/03/20  11:52 AM

## 2020-05-03 NOTE — ED Notes (Signed)
Pt changed into purple scrubs 

## 2020-05-03 NOTE — ED Notes (Signed)
gpd decided not to take ivd papers because shes co-operative at present

## 2020-05-03 NOTE — ED Notes (Signed)
Pt's breakfast arrived 

## 2020-05-03 NOTE — ED Notes (Signed)
The pt asked to go to the br all the time talking in a  Low voice  Not to me but  To some one not here  As I walked out the door she spoke to me in a loud clear voice and made direct eye contact with me.  She asked for her phone second time    Not given

## 2020-05-03 NOTE — ED Notes (Signed)
The pt keeps obcessing about her phone  Keys and her  Watch in her bag locked up

## 2020-05-03 NOTE — ED Notes (Addendum)
Skin searched performed with Stacey MHT, she hs an old scar on her right upper arm and lower leg.  A old scar is on her left wrist.

## 2020-05-03 NOTE — ED Notes (Signed)
[name] [name] is resting quietly in her chair bed after being medicated earlier.

## 2020-05-03 NOTE — ED Notes (Signed)
Getting tts 

## 2020-05-03 NOTE — ED Notes (Signed)
Patient is manic speaking in run-on sentences gasping in between words.

## 2020-05-03 NOTE — ED Notes (Signed)
Patient is experiencing what seems to be hallucinations. Patient is speaking to herself saying "people will kill you." and other statements such as "who found out?". Observation continues.

## 2020-05-03 NOTE — BH Assessment (Signed)
Comprehensive Clinical Assessment (CCA) Screening, Triage and Referral Note  05/03/2020 [name] [name] [id number]    Pt presents to [facility] for a audio hallucinations, per EDP report, "Patient with history of paranoid schizophrenia presents to ED with [employer] after they were called by the patient stating "I need help". She reports she is hearing the voice of God telling her to kill herself. She denies HI. Per [employer], as they were leaving to come to the hospital, the patient attempted to lock herself in her apartment, ran into the kitchen and put a number of Aleve in her mouth, after which she spit them out without swallowing them. She arrived to ED in restraints which were removed. The patient states she wants to stay for help voluntarily."   During assessment pt states, " I had problems with God, somebody is trying to kill me, God". Pt currently denies SI, HI, AVH and SIB but states she was hearing voice of God  Telling her to kill herself. Per pt chart, pt has history of multiple hospilizations and history of paranoid schizophrenia. Pt was last assessed on 04/18/20 for similar presentation. Pt reports she is getting 5 hours of sleep daily with a fair appetite. Pt lives in alone in [place], and she is on disability.  Pt receives outpatient psychiatric treatment for Schizoaffective Disorder through [facility].  Pt reports she missed one day of not taking medications due to CST team not dropping medications off one day this weekend. Pt states is mainly med compliant most of the time.  During assessment, Pt presented as alert and oriented. She had fair eye contact and was cooperative. Pt was dressed in street clothes, and she appeared appropriately groomed. Pt's mood was preoccupied (paranoid delusion), and affect was blunted. Pt's speech was normal in rate, rhythm, and volume. Thought processes were within normal range, and thought content was coherent; paranoid ideation suggested delusion. Pt's memory  and concentration were fair. Pt's insight was poor. Judgment and impulse control were fair.    [name], FNP recommends pt for continual assessment at [facility], pending negative COVID test. TTS confirm with nurse.     Diagnosis:Schizoaffective Disorder    Visit Diagnosis:    ICD-10-CM   1. Auditory hallucinations  R44.0   2. History of schizophrenia  Z86.59     Patient Reported Information How did you hear about [place]? Self   Referral name: No data recorded  Referral phone number: No data recorded Whom do you see for routine medical problems? I don't have a doctor   Practice/Facility Name: No data recorded  Practice/Facility Phone Number: No data recorded  Name of Contact: No data recorded  Contact Number: No data recorded  Contact Fax Number: No data recorded  Prescriber Name: No data recorded  Prescriber Address (if known): No data recorded What Is the Reason for Your Visit/Call Today? audio hallucinations  How Long Has This Been Causing You Problems? <Week  Have You Recently Been in Any Inpatient Treatment (Hospital/Detox/Crisis Center/28-Day Program)? No   Name/Location of Program/Hospital:[facility]   How Long Were You There? 11/10/2019 - 1/07/20210   When Were You Discharged? 11/13/19  Have You Ever Received Services From [facility] Before? Yes   Who Do You See at [facility]? Inpatient services - saw Dr. Malvin Johns  Have You Recently Had Any Thoughts About Hurting Yourself? No   Are You Planning to Commit Suicide/Harm Yourself At This time?  No  Have you Recently Had Thoughts About Hurting Someone [name]?  No   Explanation: No data recorded Have You Used Any Alcohol or Drugs in the Past 24 Hours? No   How Long Ago Did You Use Drugs or Alcohol?  No data recorded  What Did You Use and How Much? No data recorded What Do You Feel Would Help You the Most Today? Assessment Only  Do You Currently Have a Therapist/Psychiatrist? Yes   Name of  Therapist/Psychiatrist: CST Team   Have You Been Recently Discharged From Any Office Practice or Programs? No   Explanation of Discharge From Practice/Program:  No data recorded    CCA Screening Triage Referral Assessment Type of Contact: Tele-Assessment   Is this Initial or Reassessment? Initial Assessment   Date Telepsych consult ordered in [facility]:  05/02/20   Time Telepsych consult ordered in [facility]:  2001  Patient Reported Information Reviewed? Yes   Patient Left Without Being Seen? No data recorded  Reason for Not Completing Assessment: Cart not available (Staff advised they would set it up when ready)  Collateral Involvement: none  Does Patient Have a [place]? No data recorded  Name and Contact of Legal Guardian:  Self  If Minor and Not Living with Parent(s), Who has Custody? N/A  Is CPS involved or ever been involved? Never  Is APS involved or ever been involved? Never  Patient Determined To Be At Risk for Harm To Self or Others Based on Review of Patient Reported Information or Presenting Complaint? No   Method: No data recorded  Availability of Means: No data recorded  Intent: No data recorded  Notification Required: No data recorded  Additional Information for Danger to Others Potential:  No data recorded  Additional Comments for Danger to Others Potential:  No data recorded  Are There Guns or Other Weapons in Your Home?  No data recorded   Types of Guns/Weapons: No data recorded   Are These Weapons Safely Secured?                              No data recorded   Who Could Verify You Are Able To Have These Secured:    No data recorded Do You Have any Outstanding Charges, Pending Court Dates, Parole/Probation? No data recorded Contacted To Inform of Risk of Harm To Self or Others: No data recorded Location of Assessment: [facility] ED  Does Patient Present under Involuntary Commitment? No   IVC Papers Initial File Date: No data recorded  [place] of  Residence: [place]  Patient Currently Receiving the Following Services: CST Marine scientist)   Determination of Need: No data recorded  Options For Referral: ED Referral   Donato Heinz, [place]

## 2020-05-03 NOTE — ED Provider Notes (Signed)
  Physical Exam  BP 120/70   Pulse 85   Temp 97.8 F (36.6 C)   Resp 16   Ht 4\' 11"  (1.499 m)   Wt 49.4 kg   SpO2 97%   BMI 22.00 kg/m   Physical Exam  ED Course/Procedures     Procedures  MDM  Patient is going to behavioral health voluntarily.       , MD 05/03/20 [phone]

## 2020-05-03 NOTE — ED Notes (Signed)
Pt up and eating breakfast

## 2020-05-03 NOTE — ED Notes (Signed)
Pt talking to herself when  Nurse not in the room

## 2020-05-03 NOTE — ED Notes (Signed)
Pt resting on pull out with eyes closed unlabored respirations. NAD. Continuous monitoring continues.  

## 2020-05-03 NOTE — ED Notes (Signed)
[name] [name] continues to sleep without distress in her chair bed.

## 2020-05-03 NOTE — ED Notes (Signed)
Pt has refused to sign her papers. Clothes and black bag inventoried earrings placed with security    Pt notified

## 2020-05-04 DIAGNOSIS — Z79899 Other long term (current) drug therapy: Secondary | ICD-10-CM | POA: Diagnosis not present

## 2020-05-04 DIAGNOSIS — F200 Paranoid schizophrenia: Secondary | ICD-10-CM | POA: Diagnosis not present

## 2020-05-04 DIAGNOSIS — Z915 Personal history of self-harm: Secondary | ICD-10-CM | POA: Diagnosis not present

## 2020-05-04 DIAGNOSIS — F203 Undifferentiated schizophrenia: Secondary | ICD-10-CM | POA: Diagnosis not present

## 2020-05-04 NOTE — ED Notes (Signed)
[name] [name] was transported at 1220 via [facility] transport in route home with her personal belongings.

## 2020-05-04 NOTE — ED Notes (Signed)
Patient belongings in locker 27 

## 2020-05-04 NOTE — ED Notes (Signed)
[name] [name] received her discharge order, the AVS was reviewed and her questions answered.
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
## 2020-05-04 NOTE — ED Notes (Signed)
Received [name] [name] this AM at the change of shift awake in her bed. She ate , showered and received her medication. She is responding to internal stimuli verbally and was asked to lower her voice on several occassions. She is restless this AM.

## 2020-05-04 NOTE — ED Notes (Signed)
Patient has breakfast at the bedside

## 2020-05-04 NOTE — Discharge Instructions (Addendum)
You will need your second dose of [place] in 2 weeks. You recived your first dose on 05/03/2020 of 156 mg.

## 2020-05-08 ENCOUNTER — Emergency Department (HOSPITAL_COMMUNITY)
Admission: EM | Admit: 2020-05-08 | Discharge: 2020-05-09 | Disposition: A | Discharge status: Home / Self Care | Payer: Medicare Other | Attending: Emergency Medicine | Admitting: Emergency Medicine

## 2020-05-08 DIAGNOSIS — Z03818 Encounter for observation for suspected exposure to other biological agents ruled out: Secondary | ICD-10-CM | POA: Diagnosis not present

## 2020-05-08 DIAGNOSIS — Z79899 Other long term (current) drug therapy: Secondary | ICD-10-CM | POA: Diagnosis not present

## 2020-05-08 DIAGNOSIS — D649 Anemia, unspecified: Secondary | ICD-10-CM

## 2020-05-08 DIAGNOSIS — F200 Paranoid schizophrenia: Secondary | ICD-10-CM | POA: Insufficient documentation

## 2020-05-08 DIAGNOSIS — Z20822 Contact with and (suspected) exposure to covid-19: Secondary | ICD-10-CM | POA: Diagnosis not present

## 2020-05-08 DIAGNOSIS — F22 Delusional disorders: Secondary | ICD-10-CM | POA: Diagnosis present

## 2020-05-08 DIAGNOSIS — F29 Unspecified psychosis not due to a substance or known physiological condition: Secondary | ICD-10-CM | POA: Diagnosis not present

## 2020-05-08 LAB — COMPREHENSIVE METABOLIC PANEL
ALT: 15 U/L (ref 0–44)
AST: 18 U/L (ref 15–41)
Albumin: 3.8 g/dL (ref 3.5–5.0)
Alkaline Phosphatase: 46 U/L (ref 38–126)
Anion gap: 9 (ref 5–15)
BUN: 10 mg/dL (ref 6–20)
CO2: 25 mmol/L (ref 22–32)
Calcium: 10.1 mg/dL (ref 8.9–10.3)
Chloride: 103 mmol/L (ref 98–111)
Creatinine, Ser: 0.57 mg/dL (ref 0.44–1.00)
GFR calc Af Amer: >60 mL/min (ref >60)
GFR calc non Af Amer: >60 mL/min (ref >60)
Glucose, Bld: 85 mg/dL (ref 70–99)
Potassium: 3.6 mmol/L (ref 3.5–5.1)
Sodium: 137 mmol/L (ref 135–145)
Total Bilirubin: 0.6 mg/dL (ref 0.3–1.2)
Total Protein: 6.6 g/dL (ref 6.5–8.1)

## 2020-05-08 LAB — CBC WITH DIFFERENTIAL/PLATELET
Abs Immature Granulocytes: 0.01 10*3/uL (ref 0.00–0.07)
Basophils Absolute: 0 10*3/uL (ref 0.0–0.1)
Basophils Relative: 0 %
Eosinophils Absolute: 0 10*3/uL (ref 0.0–0.5)
Eosinophils Relative: 0 %
HCT: 33.9 % — ABNORMAL LOW (ref 36.0–46.0)
Hemoglobin: 11 g/dL — ABNORMAL LOW (ref 12.0–15.0)
Immature Granulocytes: 0 %
Lymphocytes Relative: 31 %
Lymphs Abs: 1.7 10*3/uL (ref 0.7–4.0)
MCH: 28.4 pg (ref 26.0–34.0)
MCHC: 32.4 g/dL (ref 30.0–36.0)
MCV: 87.6 fL (ref 80.0–100.0)
Monocytes Absolute: 0.5 10*3/uL (ref 0.1–1.0)
Monocytes Relative: 9 %
Neutro Abs: 3.3 10*3/uL (ref 1.7–7.7)
Neutrophils Relative %: 60 %
Platelets: 303 10*3/uL (ref 150–400)
RBC: 3.87 MIL/uL (ref 3.87–5.11)
RDW: 13.7 % (ref 11.5–15.5)
WBC: 5.4 10*3/uL (ref 4.0–10.5)
nRBC: 0 % (ref 0.0–0.2)

## 2020-05-08 LAB — SARS CORONAVIRUS 2 BY RT PCR (HOSPITAL ORDER, PERFORMED IN ~~LOC~~ HOSPITAL LAB): SARS Coronavirus 2: NEGATIVE

## 2020-05-08 LAB — ACETAMINOPHEN LEVEL: Acetaminophen (Tylenol), Serum: <10 ug/mL — ABNORMAL LOW (ref 10–30)

## 2020-05-08 LAB — I-STAT BETA HCG BLOOD, ED (MC, WL, AP ONLY): I-stat hCG, quantitative: <5 m[IU]/mL (ref <5)

## 2020-05-08 LAB — SALICYLATE LEVEL: Salicylate Lvl: <7 mg/dL — ABNORMAL LOW (ref 7.0–30.0)

## 2020-05-08 LAB — ETHANOL: Alcohol, Ethyl (B): <10 mg/dL (ref <10)

## 2020-05-08 MED ORDER — ZIPRASIDONE MESYLATE 20 MG IM SOLR
20.0000 mg | Freq: Once | INTRAMUSCULAR | Status: AC
  Administered 2020-05-08: 20 mg via INTRAMUSCULAR
  Filled 2020-05-08: qty 20

## 2020-05-08 MED ORDER — STERILE WATER FOR INJECTION IJ SOLN
INTRAMUSCULAR | Status: AC
  Filled 2020-05-08: qty 10

## 2020-05-08 MED ORDER — ONDANSETRON HCL 4 MG PO TABS: 4.0000 mg | ORAL_TABLET | Freq: Three times a day (TID) | ORAL | Status: DC | PRN

## 2020-05-08 MED ORDER — BENZTROPINE MESYLATE 1 MG PO TABS
1.0000 mg | ORAL_TABLET | Freq: Two times a day (BID) | ORAL | Status: DC
  Administered 2020-05-08 – 2020-05-09 (×2): 1 mg via ORAL
  Filled 2020-05-08 (×3): qty 1

## 2020-05-08 MED ORDER — ALUM & MAG HYDROXIDE-SIMETH 200-200-20 MG/5ML PO SUSP: 30.0000 mL | Freq: Four times a day (QID) | ORAL | Status: DC | PRN

## 2020-05-08 MED ORDER — RISPERIDONE 2 MG PO TABS
4.0000 mg | ORAL_TABLET | Freq: Two times a day (BID) | ORAL | Status: DC
  Administered 2020-05-08 – 2020-05-09 (×2): 4 mg via ORAL
  Filled 2020-05-08 (×4): qty 2

## 2020-05-08 MED ORDER — OLANZAPINE 5 MG PO TABS
15.0000 mg | ORAL_TABLET | Freq: Every day | ORAL | Status: DC
  Administered 2020-05-08 – 2020-05-09 (×2): 15 mg via ORAL
  Filled 2020-05-08 (×3): qty 1

## 2020-05-08 MED ORDER — SERTRALINE HCL 50 MG PO TABS
50.0000 mg | ORAL_TABLET | Freq: Every day | ORAL | Status: DC
  Administered 2020-05-08 – 2020-05-09 (×2): 50 mg via ORAL
  Filled 2020-05-08 (×3): qty 1

## 2020-05-08 MED ORDER — ACETAMINOPHEN 325 MG PO TABS: 650.0000 mg | ORAL_TABLET | ORAL | Status: DC | PRN

## 2020-05-08 MED ORDER — TRAZODONE HCL 50 MG PO TABS: 150.0000 mg | ORAL_TABLET | Freq: Every evening | ORAL | Status: DC | PRN

## 2020-05-08 NOTE — ED Notes (Signed)
Pt belongings in [place] [address] (  X 2 bags (shoes, pants, socks, shirt). Phone, wallet, giftcards. Drivers license with security.
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
## 2020-05-08 NOTE — ED Notes (Signed)
Belonging w/valuables inventoried by Primary RN and placed in locker 6 in Purple zone-Monique,RN
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
## 2020-05-09 ENCOUNTER — Other Ambulatory Visit: Payer: Self-pay

## 2020-05-09 NOTE — ED Notes (Signed)
Breakfast tray ordered 

## 2020-05-09 NOTE — ED Notes (Signed)
Taxi voucher was given to pt & [name] [name] was contacted for pt before being escorted to the waiting room to wait for the cab.
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
## 2020-05-09 NOTE — ED Notes (Signed)
Pt's breakfast arrived 

## 2020-05-09 NOTE — ED Notes (Signed)
IVC paperwork rescinded by Dr Criss Alvine - Copy faxed to [facility] - Copy sent to Medical Records - Original placed in folder for [employer].

## 2020-05-09 NOTE — ED Notes (Signed)
Pt asked for her cell phone & this RN informed her that she was not able, at this moment, to have it, due to the rules of the department for pt's under psych eval. She was given a snack while waiting on her lunch tray & a phone call was offered to her with a number she already knows without getting her cell phone out to look at her contacts.

## 2020-05-09 NOTE — ED Notes (Signed)
Pt also refused her temperature to be taken before D/C d/t paranoia that it had germs on it.
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
## 2020-05-09 NOTE — ED Notes (Signed)
Signature pad not working at the time of D/C on the WOW being used. Pt verbalized her understanding of the diagnosis she was given & assisted to find a ride home before she was sent out.
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
## 2020-05-09 NOTE — ED Notes (Signed)
Lunch Tray Ordered @ 1040. 
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
## 2020-05-11 ENCOUNTER — Encounter (HOSPITAL_COMMUNITY): Payer: Self-pay | Admitting: Emergency Medicine

## 2020-05-11 ENCOUNTER — Emergency Department (HOSPITAL_COMMUNITY)
Admission: EM | Admit: 2020-05-11 | Discharge: 2020-05-12 | Disposition: A | Discharge status: Home / Self Care | Payer: Medicare Other | Attending: Emergency Medicine | Admitting: Emergency Medicine

## 2020-05-11 ENCOUNTER — Other Ambulatory Visit: Payer: Self-pay

## 2020-05-11 DIAGNOSIS — F22 Delusional disorders: Secondary | ICD-10-CM

## 2020-05-11 DIAGNOSIS — F200 Paranoid schizophrenia: Secondary | ICD-10-CM | POA: Diagnosis not present

## 2020-05-11 DIAGNOSIS — Z79899 Other long term (current) drug therapy: Secondary | ICD-10-CM | POA: Insufficient documentation

## 2020-05-11 DIAGNOSIS — R404 Transient alteration of awareness: Secondary | ICD-10-CM | POA: Diagnosis not present

## 2020-05-11 LAB — ACETAMINOPHEN LEVEL: Acetaminophen (Tylenol), Serum: <10 ug/mL — ABNORMAL LOW (ref 10–30)

## 2020-05-11 LAB — COMPREHENSIVE METABOLIC PANEL
ALT: 15 U/L (ref 0–44)
AST: 19 U/L (ref 15–41)
Albumin: 3.9 g/dL (ref 3.5–5.0)
Alkaline Phosphatase: 42 U/L (ref 38–126)
Anion gap: 10 (ref 5–15)
BUN: 14 mg/dL (ref 6–20)
CO2: 24 mmol/L (ref 22–32)
Calcium: 9.4 mg/dL (ref 8.9–10.3)
Chloride: 103 mmol/L (ref 98–111)
Creatinine, Ser: 0.71 mg/dL (ref 0.44–1.00)
GFR calc Af Amer: >60 mL/min (ref >60)
GFR calc non Af Amer: >60 mL/min (ref >60)
Glucose, Bld: 82 mg/dL (ref 70–99)
Potassium: 3.4 mmol/L — ABNORMAL LOW (ref 3.5–5.1)
Sodium: 137 mmol/L (ref 135–145)
Total Bilirubin: 0.3 mg/dL (ref 0.3–1.2)
Total Protein: 6.9 g/dL (ref 6.5–8.1)

## 2020-05-11 LAB — I-STAT BETA HCG BLOOD, ED (MC, WL, AP ONLY): I-stat hCG, quantitative: <5 m[IU]/mL (ref <5)

## 2020-05-11 LAB — SALICYLATE LEVEL: Salicylate Lvl: <7 mg/dL — ABNORMAL LOW (ref 7.0–30.0)

## 2020-05-11 LAB — CBC
HCT: 34.6 % — ABNORMAL LOW (ref 36.0–46.0)
Hemoglobin: 11 g/dL — ABNORMAL LOW (ref 12.0–15.0)
MCH: 28.1 pg (ref 26.0–34.0)
MCHC: 31.8 g/dL (ref 30.0–36.0)
MCV: 88.3 fL (ref 80.0–100.0)
Platelets: 278 10*3/uL (ref 150–400)
RBC: 3.92 MIL/uL (ref 3.87–5.11)
RDW: 13.6 % (ref 11.5–15.5)
WBC: 5.2 10*3/uL (ref 4.0–10.5)
nRBC: 0 % (ref 0.0–0.2)

## 2020-05-11 LAB — ETHANOL: Alcohol, Ethyl (B): <10 mg/dL (ref <10)

## 2020-05-11 LAB — RAPID URINE DRUG SCREEN, HOSP PERFORMED
Amphetamines: NOT DETECTED
Barbiturates: NOT DETECTED
Benzodiazepines: NOT DETECTED
Cocaine: NOT DETECTED
Opiates: NOT DETECTED
Tetrahydrocannabinol: NOT DETECTED

## 2020-05-11 NOTE — ED Notes (Signed)
Pt resting comfortably on arrival to [facility], NAD noted, no needs or requests expressed at this time

## 2020-05-11 NOTE — ED Provider Notes (Signed)
[facility] EMERGENCY DEPARTMENT Provider Note   CSN: [id number] Arrival date & time: 05/11/20  1559     History Chief Complaint  Patient presents with  . Paranoid    [name] [name] is a 44 y.o. female.  44 y.o female with a PMH of Paranoid Schizophrenia presents to the ED with a chief complaint of paranoid via EMS.  Level 5 caveat due to psychiatric illness.   The history is provided by the patient.       Past Medical History:  Diagnosis Date  . Arthritis   . Depression   . Hallucinations   . Paranoid schizophrenia (HCC)   . Schizoaffective disorder [facility])     Patient Active Problem List   Diagnosis Date Noted  . Schizophrenia, paranoid type (HCC) 03/20/2020  . 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 Homelessness 07/06/2019  . Hallucinations     History reviewed. No pertinent surgical history.   OB History   No obstetric history on file.     No family history on file.  Social History   Tobacco Use  . Smoking status: Never Smoker  . Smokeless tobacco: Never Used  Vaping Use  . Vaping Use: Never used  Substance Use Topics  . Alcohol use: Not Currently    Comment: She denies   . Drug use: Not Currently    Comment: She denies     Home Medications Prior to Admission medications   Medication Sig Start Date End Date Taking? Authorizing Provider  benztropine (COGENTIN) 1 MG tablet Take 1 tablet (1 mg total) by mouth 2 (two) times daily. 11/13/19   Malvin Johns, MD  INVEGA SUSTENNA 234 MG/1.5ML SUSY injection Inject 234 mg into the muscle every 28 (twenty-eight) days. 03/24/20   [provider]  OLANZapine (ZYPREXA) 15 MG tablet Take 15 mg by mouth daily. 04/22/20   [provider]  risperidone (RISPERDAL) 4 MG tablet Take 1 tablet (4 mg total) by mouth 2 (two) times daily. 11/13/19   Malvin Johns, MD  sertraline (ZOLOFT) 50 MG tablet Take 1 tablet (50 mg total) by mouth daily. 09/22/19   Rankin, Shuvon B, NP   traZODone (DESYREL) 150 MG tablet Take 1 tablet (150 mg total) by mouth at bedtime as needed and may repeat dose one time if needed for sleep. 11/13/19   Malvin Johns, MD    Allergies    Patient has no known allergies.  Review of Systems   Review of Systems  Unable to perform ROS: Psychiatric disorder    Physical Exam Updated Vital Signs BP 110/65 (BP Location: Left Arm)   Pulse 68   Temp 98.3 F (36.8 C) (Oral)   Resp 14   SpO2 100%   Physical Exam Vitals and nursing note reviewed.  Constitutional:      Appearance: Normal appearance.     Comments: Resting comfortably  In bed after haldol.   HENT:     Head: Normocephalic and atraumatic.     Mouth/Throat:     Mouth: Mucous membranes are moist.  Eyes:     Pupils: Pupils are equal, round, and reactive to light.  Cardiovascular:     Rate and Rhythm: Normal rate.  Pulmonary:     Effort: Pulmonary effort is normal.     Breath sounds: No wheezing.  Abdominal:     General: Abdomen is flat.  Musculoskeletal:     Cervical back: Normal range of motion and neck supple.  Skin:    General: Skin is  warm and dry.  Neurological:     Mental Status: She is alert and oriented to person, place, and time.     ED Results / Procedures / Treatments   Labs (all labs ordered are listed, but only abnormal results are displayed) Labs Reviewed  COMPREHENSIVE METABOLIC PANEL - Abnormal; Notable for the following components:      Result Value   Potassium 3.4 (*)    All other components within normal limits  SALICYLATE LEVEL - Abnormal; Notable for the following components:   Salicylate Lvl <7.0 (*)    All other components within normal limits  ACETAMINOPHEN LEVEL - Abnormal; Notable for the following components:   Acetaminophen (Tylenol), Serum <10 (*)    All other components within normal limits  CBC - Abnormal; Notable for the following components:   Hemoglobin 11.0 (*)    HCT 34.6 (*)    All other components within normal limits   ETHANOL  RAPID URINE DRUG SCREEN, HOSP PERFORMED  I-STAT BETA HCG BLOOD, ED ([facility], [facility], [facility] ONLY)    EKG None  Radiology No results found.  Procedures Procedures (including critical care time)  Medications Ordered in ED Medications - No data to display  ED Course  I have reviewed the triage vital signs and the nursing notes.  Pertinent labs & imaging results that were available during my care of the patient were reviewed by me and considered in my medical decision making (see chart for details).    MDM Rules/Calculators/A&P   Patient with a past medical history of mental illness presents to the ED brought in by EMS for paranoia, patient reports "Jesus is trying to kill me, he is trying to rape me ".  Patient does have a prior visit for the same complaint, was IVC however somehow discharged home after.  She received 5 mg of Haldol while in the waiting room due to behavioral disturbance.  She was agitated on arrival, however during my evaluation patient is resting comfortably without any complaints.  Interpretation of her labs revealed a CBC which remains within her baseline.  CMP is unremarkable.  Salicylate and acetaminophen level within her baseline.  hCG is negative.  UDS is negative for any illicit drug use.  She does not voice any harm to herself at this time, however she will need to be reassessed and also have psychiatric consultation via TTS.  Cording to her chart which have extensively reviewed, patient is supposed to be checked by CST team twice daily however is in the emergency department today.  Patient clear for psychiatric evaluation.     Portions of this note were generated with Scientist, clinical (histocompatibility and immunogenetics). Dictation errors may occur despite best attempts at proofreading.  Final Clinical Impression(s) / ED Diagnoses Final diagnoses:  Paranoia [facility])    Rx / DC Orders ED Discharge Orders    None       Freddy Jaksch 05/11/20 2125    Linwood Dibbles,  MD 05/11/20 2345

## 2020-05-11 NOTE — ED Triage Notes (Signed)
Pt BIB [facility] and [facility], pt stating she is hearing multiple voices and that Jesus is trying to kill her. Given 5mg  haldol IM by EMS. Pt with flat affect. EMS VSS.

## 2020-05-12 MED ORDER — BENZTROPINE MESYLATE 1 MG PO TABS
1.0000 mg | ORAL_TABLET | Freq: Two times a day (BID) | ORAL | Status: DC
  Administered 2020-05-12: 1 mg via ORAL
  Filled 2020-05-12: qty 1

## 2020-05-12 MED ORDER — OLANZAPINE 5 MG PO TABS
15.0000 mg | ORAL_TABLET | Freq: Every day | ORAL | Status: DC
  Administered 2020-05-12: 15 mg via ORAL
  Filled 2020-05-12: qty 1

## 2020-05-12 MED ORDER — RISPERIDONE 2 MG PO TABS
4.0000 mg | ORAL_TABLET | Freq: Two times a day (BID) | ORAL | Status: DC
  Administered 2020-05-12: 4 mg via ORAL
  Filled 2020-05-12 (×2): qty 2

## 2020-05-12 MED ORDER — SERTRALINE HCL 50 MG PO TABS
50.0000 mg | ORAL_TABLET | Freq: Every day | ORAL | Status: DC
  Administered 2020-05-12: 50 mg via ORAL
  Filled 2020-05-12 (×2): qty 1

## 2020-05-12 NOTE — ED Notes (Signed)
[facility] recommends observation & reassessment this afternoon (05/12/20)

## 2020-05-12 NOTE — Progress Notes (Signed)
Patient ID: [name] [name], female   DOB: Mar 14, 1976, 44 y.o.   MRN: [id number]   Psychiatric reassessment   In brief; [name] [name] is a 44 year old female who is well know to the [facility] system. Her psychiatric history is significant for Paranoid schizophrenia to include fixed delusions mostly related to God and [name]. She commonly presents with these delusions which are chronic and are not of new concern. Per review of chart, patient presented to [facility], where she has had numerous ED admissions, stating that, "God is telling her to kill her self." However, it was noted that she denied current SI, HI, AVH and SIB.  Patient re- evaluated  by this clinician. She is alert an oriented x4, calm and cooperative. She denied SI, HI and hallucinations although continued to present with fixed delusions stating," Jesus is after me and threatening to take my life." She does not appear internally or externally preoccupied. She does not appear in acute distress.  She denies substance abuse or use. She stated that she missed her medication Saturday which was  Suppose to be administered by her CST team although she received her injection Saturday when she presented to the ED.  Stated the CST comes daily to administer her medications and to provide therapy. Stated her only concern is that she does not like the CST team because they," donut believe ne when I say Jesus is coming to get me." Patient provided verbal consent to speak with CST team member.  I spoke to Bernie Covey from the CST team, phone number [phone], who stated that patient has been receiving her medications. He stated that she missed her medication Saturday because when they went to her home, patient did not open the door. In regard to patients delusions, Mr. Chestine Spore stated that her delusions are fixed and and she always state that [name] is trying to get her. He added that this is patients baseline and that he had no concerns.   Disposition: Patient  denies SI, HI and psychosis. She has a psychiatric history of  Paranoid schizophrenia and fixed delusions of [name] trying to harm her. Patient is followed by CST team whom I spoke to verified that patient delusions were chronic and fixed on [name]. CST team member noted that this was patients baseline and they had no concerns. At this time, there is no evidence of imminent risk to self or others at present. Patients case was discussed with Dr. Lucianne Muss who concluded that patient does not meet criteria for psychiatric inpatient admission as such, she is psychiatrically cleared. It is  reccommended that she continue with her outpatient psychiatric services (medication management and therapy) with the CST team.    ED u[pdated on disposition

## 2020-05-12 NOTE — ED Notes (Signed)
Breakfast ordered 

## 2020-05-12 NOTE — ED Provider Notes (Signed)
I was informed by nursing staff that patient has been psychiatrically cleared.  Patient has a psychiatric team that ensure she gets her medications twice a day and follows with her and knows her well.  She reportedly has been at her baseline according to TTS who spoke with Bernie Covey from the CST team.  Reportedly patients delusions are fixed and that this is her baseline.    Patient is resting comfortably.  She denies SI, HI.  She states that she feels safe at home.   Per RN patient is not IVCd.   Patient will be discharged to follow up with her CST team per tts recommendations.    Cristina Gong, Cordelia Poche 05/12/20 1339    Alvira Monday, MD 05/13/20 2237

## 2020-05-12 NOTE — ED Notes (Signed)
Pt in taking shower and started screaming. Door opened and she reports "god is trying to kill me in here". Sitter redirected pt back to room. She took dirty clothes and put in laundry basket and got her self dressed.

## 2020-05-12 NOTE — ED Notes (Signed)
TTS in progress 

## 2020-05-12 NOTE — ED Notes (Signed)
Pt placed in room 14 for TTS

## 2020-05-12 NOTE — Discharge Instructions (Addendum)
If at any point you feel unsafe, or have additional concerns please seek additional medical care and evaluation.  Please touch base with your team.

## 2020-05-12 NOTE — BH Assessment (Signed)
Comprehensive Clinical Assessment (CCA) Screening, Triage and Referral Note  05/12/2020 [name] [name] [id number]      Visit Diagnosis:    ICD-10-CM   1. Paranoia (HCC)  F22     Pt reports to [facility] voluntarily for paranoia. Pt states, " God is after me again". Pt states God is telling her to kill her self. Pt currently denies SI, HI, AVH and SIB. Pt reports getting 5 hours of sleep daily with a fair appetite. Pt denies any current drug use. Pt states she has been of her medications last 3 days due to her CST team not dropping her medications off. Pt reports  Current stressful living situation and states she feels needs help. Per pt chart pt has a history of paranoid schizophrenia diagnosis and delusion of persecution from God as well as multiple hospitilizations. Pt receives outpatient psychiatric treatment for Schizoaffective Disorder through [facility]. Pt reports she missed one day of not taking medications due to CST team not dropping medications off one day this weekend. Pt states is mainly med compliant most of the time. Pt was last seen at [facility] on 05/08/20 for similar presentation.    Diagnosis: Schizophrenia Disposition: Adaku, Anike, FNP recommends pt for  Overnight observation reassess in the morning.  How did you hear about [place]? Self   Referral name: Self   Referral phone number: No data recorded Whom do you see for routine medical problems? Hospital ER   Practice[facility]Facility Name: [facility]   Practice/Facility Phone Number: No data recorded  Name of Contact: No data recorded  Contact Number: No data recorded  Contact Fax Number: No data recorded  Prescriber Name: No data recorded  Comprehensive Clinical Assessment (CCA) Screening, Triage and Referral Note  05/12/2020 [name] [name] [id number]  Visit Diagnosis:    ICD-10-CM   1. Paranoia (HCC)  F22     Patient Reported Information How did you hear about [place]? Self   Referral name: Self   Referral phone number: No  data recorded Whom do you see for routine medical problems? Hospital ER   Practice/Facility Name: [facility]   Practice/Facility Phone Number: No data recorded  Name of Contact: No data recorded  Contact Number: No data recorded  Contact Fax Number: No data recorded  Prescriber Name: No data recorded  Prescriber Address (if known): No data recorded What Is the Reason for Your Visit/Call Today? paranoia  How Long Has This Been Causing You Problems? <Week  Have You Recently Been in Any Inpatient Treatment (Hospital/Detox/Crisis Center/28-Day Program)? No   Name/Location of Program/Hospital:[facility]   How Long Were You There? 11/10/2019 - 1/07/20210   When Were You Discharged? 11/13/19  Have You Ever Received Services From [employer] Before? Yes   Who Do You See at [facility]? Psychatrist  Have You Recently Had Any Thoughts About Hurting Yourself? No   Are You Planning to Commit Suicide/Harm Yourself At This time?  No  Have you Recently Had Thoughts About Hurting Someone [name]? No   Explanation: No data recorded Have You Used Any Alcohol or Drugs in the Past 24 Hours? No   How Long Ago Did You Use Drugs or Alcohol?  No data recorded  What Did You Use and How Much? No data recorded What Do You Feel Would Help You the Most Today? Assessment Only  Do You Currently Have a Therapist/Psychiatrist? Yes   Name of Therapist/Psychiatrist: CST Team   Have You Been Recently Discharged From Any Office Practice or Programs?  No   Explanation of Discharge From Practice/Program:  No data recorded    CCA Screening Triage Referral Assessment Type of Contact: Face-to-Face   Is this Initial or Reassessment? Initial Assessment   Date Telepsych consult ordered in [facility]:  05/11/20   Time Telepsych consult ordered in [facility]:  0559  Patient Reported Information Reviewed? Yes   Patient Left Without Being Seen? No data recorded  Reason for Not Completing Assessment: Cart not available  (Staff advised they would set it up when ready)  Collateral Involvement: none  Does Patient Have a Automotive engineer Guardian? No data recorded  Name and Contact of Legal Guardian:  Self  If Minor and Not Living with Parent(s), Who has Custody? N/A  Is CPS involved or ever been involved? Never  Is APS involved or ever been involved? Never  Patient Determined To Be At Risk for Harm To Self or Others Based on Review of Patient Reported Information or Presenting Complaint? No   Method: No data recorded  Availability of Means: No data recorded  Intent: No data recorded  Notification Required: No data recorded  Additional Information for Danger to Others Potential:  No data recorded  Additional Comments for Danger to Others Potential:  No data recorded  Are There Guns or Other Weapons in Your Home?  No data recorded   Types of Guns/Weapons: No data recorded   Are These Weapons Safely Secured?                              No data recorded   Who Could Verify You Are Able To Have These Secured:    No data recorded Do You Have any Outstanding Charges, Pending Court Dates, Parole/Probation? No data recorded Contacted To Inform of Risk of Harm To Self or Others: No data recorded Location of Assessment: [facility] ED  Does Patient Present under Involuntary Commitment? No   IVC Papers Initial File Date: No data recorded  [place] of Residence: [place]  Patient Currently Receiving the Following Services: CST Media planner)   Determination of Need: Urgent (48 hours)   Options For Referral: Other: Comment   Natasha Mead, LCSWA

## 2020-05-12 NOTE — ED Notes (Signed)
In shower pt began yelling that Jesus is going to kill her.  Pt threw her food.  Regular daily meds ordered for pt.

## 2020-05-19 ENCOUNTER — Ambulatory Visit (HOSPITAL_COMMUNITY)
Admission: AD | Admit: 2020-05-19 | Discharge: 2020-05-19 | Disposition: A | Discharge status: Home / Self Care | Payer: Medicare Other | Source: Home / Self Care | Attending: Psychiatry | Admitting: Psychiatry

## 2020-05-19 ENCOUNTER — Inpatient Hospital Stay (HOSPITAL_COMMUNITY)
Admission: AD | Admit: 2020-05-19 | Discharge: 2020-05-27 | DRG: 885 | Disposition: A | Discharge status: Home / Self Care | Payer: Medicare Other | Source: Intra-hospital | Attending: Psychiatry | Admitting: Psychiatry

## 2020-05-19 ENCOUNTER — Other Ambulatory Visit: Payer: Self-pay

## 2020-05-19 ENCOUNTER — Ambulatory Visit (HOSPITAL_COMMUNITY)
Admission: EM | Admit: 2020-05-19 | Discharge: 2020-05-19 | Disposition: A | Discharge status: Other Acute Inpatient Hospital | Payer: Medicare Other | Attending: Psychiatry | Admitting: Psychiatry

## 2020-05-19 DIAGNOSIS — Z20822 Contact with and (suspected) exposure to covid-19: Secondary | ICD-10-CM | POA: Diagnosis present

## 2020-05-19 DIAGNOSIS — G4700 Insomnia, unspecified: Secondary | ICD-10-CM | POA: Diagnosis present

## 2020-05-19 DIAGNOSIS — Z79899 Other long term (current) drug therapy: Secondary | ICD-10-CM | POA: Diagnosis not present

## 2020-05-19 DIAGNOSIS — F209 Schizophrenia, unspecified: Secondary | ICD-10-CM | POA: Diagnosis not present

## 2020-05-19 DIAGNOSIS — R45851 Suicidal ideations: Secondary | ICD-10-CM | POA: Diagnosis present

## 2020-05-19 DIAGNOSIS — F200 Paranoid schizophrenia: Principal | ICD-10-CM | POA: Diagnosis present

## 2020-05-19 DIAGNOSIS — F329 Major depressive disorder, single episode, unspecified: Secondary | ICD-10-CM | POA: Diagnosis present

## 2020-05-19 DIAGNOSIS — F201 Disorganized schizophrenia: Secondary | ICD-10-CM | POA: Diagnosis not present

## 2020-05-19 DIAGNOSIS — M1990 Unspecified osteoarthritis, unspecified site: Secondary | ICD-10-CM | POA: Insufficient documentation

## 2020-05-19 DIAGNOSIS — F250 Schizoaffective disorder, bipolar type: Secondary | ICD-10-CM | POA: Diagnosis not present

## 2020-05-19 DIAGNOSIS — Z9114 Patient's other noncompliance with medication regimen: Secondary | ICD-10-CM | POA: Diagnosis not present

## 2020-05-19 LAB — POCT URINE DRUG SCREEN - MANUAL ENTRY (I-SCREEN)
POC Amphetamine UR: NOT DETECTED
POC Buprenorphine (BUP): NOT DETECTED
POC Cocaine UR: NOT DETECTED
POC Marijuana UR: NOT DETECTED
POC Methadone UR: NOT DETECTED
POC Methamphetamine UR: NOT DETECTED
POC Morphine: NOT DETECTED
POC Oxazepam (BZO): NOT DETECTED
POC Oxycodone UR: NOT DETECTED
POC Secobarbital (BAR): NOT DETECTED

## 2020-05-19 LAB — CBC WITH DIFFERENTIAL/PLATELET
Abs Immature Granulocytes: 0.01 10*3/uL (ref 0.00–0.07)
Basophils Absolute: 0 10*3/uL (ref 0.0–0.1)
Basophils Relative: 1 %
Eosinophils Absolute: 0 10*3/uL (ref 0.0–0.5)
Eosinophils Relative: 1 %
HCT: 37.3 % (ref 36.0–46.0)
Hemoglobin: 11.9 g/dL — ABNORMAL LOW (ref 12.0–15.0)
Immature Granulocytes: 0 %
Lymphocytes Relative: 35 %
Lymphs Abs: 1.3 10*3/uL (ref 0.7–4.0)
MCH: 28.3 pg (ref 26.0–34.0)
MCHC: 31.9 g/dL (ref 30.0–36.0)
MCV: 88.8 fL (ref 80.0–100.0)
Monocytes Absolute: 0.3 10*3/uL (ref 0.1–1.0)
Monocytes Relative: 9 %
Neutro Abs: 2 10*3/uL (ref 1.7–7.7)
Neutrophils Relative %: 54 %
Platelets: 314 10*3/uL (ref 150–400)
RBC: 4.2 MIL/uL (ref 3.87–5.11)
RDW: 13.6 % (ref 11.5–15.5)
WBC: 3.6 10*3/uL — ABNORMAL LOW (ref 4.0–10.5)
nRBC: 0 % (ref 0.0–0.2)

## 2020-05-19 LAB — SARS CORONAVIRUS 2 BY RT PCR (HOSPITAL ORDER, PERFORMED IN ~~LOC~~ HOSPITAL LAB): SARS Coronavirus 2: NEGATIVE

## 2020-05-19 LAB — LIPID PANEL
Cholesterol: 181 mg/dL (ref 0–200)
HDL: 43 mg/dL (ref >40)
LDL Cholesterol: 121 mg/dL — ABNORMAL HIGH (ref 0–99)
Total CHOL/HDL Ratio: 4.2 RATIO
Triglycerides: 83 mg/dL (ref <150)
VLDL: 17 mg/dL (ref 0–40)

## 2020-05-19 LAB — TSH: TSH: 0.942 u[IU]/mL (ref 0.350–4.500)

## 2020-05-19 LAB — COMPREHENSIVE METABOLIC PANEL
ALT: 14 U/L (ref 0–44)
AST: 20 U/L (ref 15–41)
Albumin: 4.2 g/dL (ref 3.5–5.0)
Alkaline Phosphatase: 44 U/L (ref 38–126)
Anion gap: 11 (ref 5–15)
BUN: 10 mg/dL (ref 6–20)
CO2: 26 mmol/L (ref 22–32)
Calcium: 9.7 mg/dL (ref 8.9–10.3)
Chloride: 102 mmol/L (ref 98–111)
Creatinine, Ser: 0.53 mg/dL (ref 0.44–1.00)
GFR calc Af Amer: >60 mL/min (ref >60)
GFR calc non Af Amer: >60 mL/min (ref >60)
Glucose, Bld: 79 mg/dL (ref 70–99)
Potassium: 3.7 mmol/L (ref 3.5–5.1)
Sodium: 139 mmol/L (ref 135–145)
Total Bilirubin: 0.8 mg/dL (ref 0.3–1.2)
Total Protein: 7.3 g/dL (ref 6.5–8.1)

## 2020-05-19 LAB — HEMOGLOBIN A1C
Hgb A1c MFr Bld: 5 % (ref 4.8–5.6)
Mean Plasma Glucose: 96.8 mg/dL

## 2020-05-19 LAB — POC SARS CORONAVIRUS 2 AG: SARS Coronavirus 2 Ag: NEGATIVE

## 2020-05-19 LAB — POCT PREGNANCY, URINE: Preg Test, Ur: NEGATIVE

## 2020-05-19 LAB — ETHANOL: Alcohol, Ethyl (B): <10 mg/dL (ref <10)

## 2020-05-19 MED ORDER — OLANZAPINE 7.5 MG PO TABS
15.0000 mg | ORAL_TABLET | Freq: Every day | ORAL | Status: DC
  Administered 2020-05-19 – 2020-05-21 (×3): 15 mg via ORAL
  Filled 2020-05-19 (×5): qty 2

## 2020-05-19 MED ORDER — TRAZODONE HCL 150 MG PO TABS
150.0000 mg | ORAL_TABLET | Freq: Every day | ORAL | Status: DC
  Administered 2020-05-19 – 2020-05-26 (×8): 150 mg via ORAL
  Filled 2020-05-19 (×3): qty 1
  Filled 2020-05-19: qty 7
  Filled 2020-05-19 (×8): qty 1

## 2020-05-19 MED ORDER — RISPERIDONE 2 MG PO TBDP
2.0000 mg | ORAL_TABLET | Freq: Two times a day (BID) | ORAL | Status: DC
  Administered 2020-05-19: 2 mg via ORAL
  Filled 2020-05-19: qty 1

## 2020-05-19 MED ORDER — MAGNESIUM HYDROXIDE 400 MG/5ML PO SUSP
30.0000 mL | Freq: Every day | ORAL | Status: DC | PRN
  Administered 2020-05-27: 30 mL via ORAL
  Filled 2020-05-19: qty 30

## 2020-05-19 MED ORDER — ACETAMINOPHEN 325 MG PO TABS
650.0000 mg | ORAL_TABLET | Freq: Four times a day (QID) | ORAL | Status: DC | PRN
  Administered 2020-05-20 – 2020-05-26 (×5): 650 mg via ORAL
  Filled 2020-05-19 (×5): qty 2

## 2020-05-19 MED ORDER — SERTRALINE HCL 50 MG PO TABS
50.0000 mg | ORAL_TABLET | Freq: Every day | ORAL | Status: DC
  Administered 2020-05-20 – 2020-05-27 (×8): 50 mg via ORAL
  Filled 2020-05-19 (×3): qty 1
  Filled 2020-05-19: qty 7
  Filled 2020-05-19 (×8): qty 1

## 2020-05-19 MED ORDER — BENZTROPINE MESYLATE 1 MG PO TABS
1.0000 mg | ORAL_TABLET | Freq: Two times a day (BID) | ORAL | Status: DC
  Administered 2020-05-20 – 2020-05-27 (×15): 1 mg via ORAL
  Filled 2020-05-19 (×2): qty 1
  Filled 2020-05-19: qty 14
  Filled 2020-05-19 (×2): qty 1
  Filled 2020-05-19: qty 14
  Filled 2020-05-19 (×15): qty 1

## 2020-05-19 MED ORDER — HYDROXYZINE HCL 25 MG PO TABS: 25.0000 mg | ORAL_TABLET | Freq: Three times a day (TID) | ORAL | Status: DC | PRN

## 2020-05-19 MED ORDER — MAGNESIUM HYDROXIDE 400 MG/5ML PO SUSP: 30.0000 mL | Freq: Every day | ORAL | Status: DC | PRN

## 2020-05-19 MED ORDER — ALUM & MAG HYDROXIDE-SIMETH 200-200-20 MG/5ML PO SUSP: 30.0000 mL | ORAL | Status: DC | PRN

## 2020-05-19 MED ORDER — TRAZODONE HCL 150 MG PO TABS: 150.0000 mg | ORAL_TABLET | Freq: Every evening | ORAL | Status: DC | PRN

## 2020-05-19 MED ORDER — BENZTROPINE MESYLATE 1 MG PO TABS
1.0000 mg | ORAL_TABLET | Freq: Two times a day (BID) | ORAL | Status: DC
  Administered 2020-05-19: 1 mg via ORAL
  Filled 2020-05-19: qty 1

## 2020-05-19 MED ORDER — PALIPERIDONE PALMITATE ER 234 MG/1.5ML IM SUSY: 234.0000 mg | PREFILLED_SYRINGE | INTRAMUSCULAR | Status: DC

## 2020-05-19 MED ORDER — TRAZODONE HCL 50 MG PO TABS: 50.0000 mg | ORAL_TABLET | Freq: Every evening | ORAL | Status: DC | PRN

## 2020-05-19 MED ORDER — OLANZAPINE 7.5 MG PO TABS: 15.0000 mg | ORAL_TABLET | Freq: Every day | ORAL | Status: DC

## 2020-05-19 MED ORDER — RISPERIDONE 2 MG PO TBDP: 4.0000 mg | ORAL_TABLET | Freq: Two times a day (BID) | ORAL | Status: DC

## 2020-05-19 MED ORDER — ACETAMINOPHEN 325 MG PO TABS: 650.0000 mg | ORAL_TABLET | Freq: Four times a day (QID) | ORAL | Status: DC | PRN

## 2020-05-19 MED ORDER — SERTRALINE HCL 50 MG PO TABS
50.0000 mg | ORAL_TABLET | Freq: Every day | ORAL | Status: DC
  Administered 2020-05-19: 50 mg via ORAL
  Filled 2020-05-19: qty 1

## 2020-05-19 MED ORDER — RISPERIDONE 2 MG PO TABS
2.0000 mg | ORAL_TABLET | Freq: Two times a day (BID) | ORAL | Status: DC
  Administered 2020-05-20 – 2020-05-21 (×3): 2 mg via ORAL
  Filled 2020-05-19 (×8): qty 1

## 2020-05-19 NOTE — Progress Notes (Signed)
Patient ID: [name] [name], female   DOB: Mar 31, 1976, 44 y.o.   MRN: [id number]  Admission Note:  44 yr female who presents [facility] in no acute distress for the treatment of SI, Psychosis and Depression. Pt appears flat and depressed. Pt was calm and cooperative with admission process. Pt presents with passive SI/ AVH and contracts for safety upon admission. Pt denies HI .  Per Assessment: She reported hearing voices from [facility] telling her he wants her dead on arrival. She reported that [employer] won't respect her for "shit." She reported hearing [name] voice constantly and that it's getting worse and she's fed up. She reported having suicidal thoughts with no plan because she has to do something because she's tired of hearing the voices. She reported calling 911 today, on her way to a therapy session at [facility] because the voices wouldn't stop. She reported that the voices started at 3 am this morning when she woke up and stated that it's been constant since then. She reported being compliant with taking her psychotropic medications and stated that the medication do not decrease the voices but helps to keep her calm. She reported having paranoia thoughts and described the thoughts as feeling like people are trying to do something harmful to her or poison her. She reported having these thoughts "many times throughout the week." She reported feeling sad all the time because she's tired of hearing the voices all the time. She reported that she last received her Invega injection two weeks while she was at the emergency department.  She reported receiving medication management and therapy at [facility]),   A:Skin was assessed and found to be clear of any abnormal marks apart from a scars: R-shin, R- upper arm, L-wrist. PT searched and no contraband found, POC and unit policies explained and understanding verbalized. Consents obtained. Food and fluids offered, and fluids accepted.   R: Pt had no  additional questions or concerns.

## 2020-05-19 NOTE — ED Notes (Signed)
Pt belongings secured in locker #1.

## 2020-05-19 NOTE — ED Provider Notes (Signed)
Behavioral Health Admission [facility])  Date: 05/19/20 Patient Name: [name] [name] MRN: [id number] Chief Complaint: No chief complaint on file.     Diagnoses:  Final diagnoses:  None    HPI: [name] [name] is a 44 year-old, African American, female who presented to the [facility] from [facility] via [facility] with complaints of auditory hallucinations and suicidal ideations. She reported hearing voices from [facility] telling her he wants her dead on arrival. She reported that [employer] won't respect her for "shit." She reported hearing [name] voice constantly and that it's getting worse and she's fed up. She reported having suicidal thoughts with no plan because she has to do something because she's tired of hearing the voices. She reported calling 911 today, on her way to a therapy session at [facility] because the voices wouldn't stop. She reported that the voices started at 3 am this morning when she woke up and stated that it's been constant since then.  She reported being compliant with taking her psychotropic medications and stated that the medication do not decrease the voices but helps to keep her calm. She reported having paranoia thoughts and described the thoughts as feeling like people are trying to do something harmful to her or poison her. She reported having these thoughts "many times throughout the week." She reported feeling sad all the time because she's tired of hearing the voices all the time. She reported having a fair appetite. She reported fair sleep and stated that she sleeps about 5 hours per night. She reported living alone in a house. She denies having access to firearms in her home. She reported receiving medication management and therapy at [facility] Monday thru Friday from 9 am to 2 pm. She reported her current home medications to include Zoloft, Risperdal, Cogentin, Trazodone, and Invega IM. She reported that she last  received her Invega injection two weeks while she was at the emergency department.   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 Shannell at [facility] was contacted to obtain collateral information regarding Ms. Lubben treatment. Shannell stated that the patients has been compliant with taking her medications until 1 week ago when she stopped taking all her medications due to delusions. [facility] reported that they have referred the patient to [facility] and that they would like her to be admitted for 90 days.  We discussed that patient's current medications and dosages. The patient's last Invega injection 156 mg was administered on 05/03/20, and this was confirm with Kandis Nab D., at the [facility]. The patient's home medications were restarted as verified: Cogentin 1 mg PO BID, Zyprexa 15 mg PO daily at bedtime, Risperdal 2 mg PO BID, Zoloft 50 mg PO daily, Trazodone 150 mg PO at bedtime as needed. Covid test ordered. Vital signs reviewed. Patient admitted to the Continuous Observation unit pending inpatient placement.     **Invega IM 234 mg due on 05/31/20.  PHQ 2-9:    ED from 05/11/2020 in [facility] EMERGENCY DEPARTMENT  Thoughts that you would be better off dead, or of hurting yourself in some way More than half the days  PHQ-9 Total Score 10        ED from 05/11/2020 in [facility] EMERGENCY DEPARTMENT ED from 05/08/2020 in [facility] EMERGENCY DEPARTMENT ED from 05/02/2020 in [facility] EMERGENCY DEPARTMENT  C-SSRS RISK CATEGORY Error: Q3, 4, or 5  should not be populated when Q2 is No Error: Q3, 4, or 5 should not be populated when Q2 is No High Risk       Total Time spent with patient: 45 minutes  Musculoskeletal  Strength & Muscle Tone: within normal limits Gait & Station: normal Patient leans: Right  Psychiatric Specialty Exam  Presentation General Appearance: Appropriate for Environment  Eye  Contact:Fair  Speech:Clear and Coherent  Speech Volume:Normal  Handedness:Right   Mood and Affect  Mood:Depressed  Affect:Congruent;Depressed   Thought Process  Thought Processes:Coherent  Descriptions of Associations:Intact  Orientation:Full (Time, Place and Person)  Thought Content:Delusions;Paranoid Ideation  Hallucinations:Hallucinations: Auditory Description of Auditory Hallucinations: "[name] is telling me he wants me dead on arrival."  Ideas of Reference:Paranoia;Delusions  Suicidal Thoughts:Suicidal Thoughts: Yes, Active SI Active Intent and/or Plan: Without Plan;With Intent  Homicidal Thoughts:Homicidal Thoughts: No   Sensorium  Memory:Immediate Fair;Recent Fair;Remote Fair  Judgment:Fair  Insight:Fair   Executive Functions  Concentration:Fair  Attention Span:Fair  Recall:Fair  Fund of Knowledge:Fair  Language:Fair   Psychomotor Activity  Psychomotor Activity:Psychomotor Activity: Normal   Assets  Assets:Desire for Improvement;Housing;Leisure Time;Physical Health;Social Support   Sleep  Sleep:Sleep: Fair Number of Hours of Sleep: 5   Physical Exam Vitals and nursing note reviewed.  Constitutional:      Appearance: She is well-developed.  Cardiovascular:     Rate and Rhythm: Normal rate.  Pulmonary:     Effort: Pulmonary effort is normal.  Musculoskeletal:        General: Normal range of motion.  Skin:    General: Skin is warm.  Neurological:     Mental Status: She is alert and oriented to person, place, and time.    Review of Systems  Constitutional: Negative.   HENT: Negative.   Eyes: Negative.   Respiratory: Negative.   Cardiovascular: Negative.   Gastrointestinal: Negative.   Genitourinary: Negative.   Musculoskeletal: Negative.   Skin: Negative.   Neurological: Negative.   Endo/Heme/Allergies: Negative.   Psychiatric/Behavioral: Positive for depression, hallucinations and suicidal ideas.    Blood  pressure 107/72, pulse 81, temperature 97.8 F (36.6 C), temperature source Oral, resp. rate 18, height 4\' 11"  (1.499 m), weight 121 lb (54.9 kg), SpO2 100 %. Body mass index is 24.44 kg/m.  Past Psychiatric History: Frequent emergency department visits.  Is the patient at risk to self? Yes  Has the patient been a risk to self in the past 6 months? No .    Has the patient been a risk to self within the distant past? No   Is the patient a risk to others? No   Has the patient been a risk to others in the past 6 months? No   Has the patient been a risk to others within the distant past? No   Past Medical History:  Past Medical History:  Diagnosis Date  . Arthritis   . Depression   . Hallucinations   . Paranoid schizophrenia (HCC)   . Schizoaffective disorder (HCC)    No past surgical history on file.  Family History: Unknown  Social History:  Social History   Socioeconomic History  . Marital status: Single    Spouse name: Not on file  . Number of children: Not on file  . Years of education: Not on file  . Highest education level: Not on file  Occupational History  . Occupation: Disability  Tobacco Use  . Smoking status: Never Smoker  . Smokeless tobacco: Never Used  Vaping Use  .  Vaping Use: Never used  Substance and Sexual Activity  . Alcohol use: Not Currently    Comment: She denies   . Drug use: Not Currently    Comment: She denies   . Sexual activity: Not Currently  Other Topics Concern  . Not on file  Social History Narrative   Pt stated that she lives in [place], and that she lives alone.  Pt receives outpatient psychiatric resources through [employer].   Social Determinants of Health   Financial Resource Strain:   . Difficulty of Paying Living Expenses:   Food Insecurity:   . Worried About Programme researcher, broadcasting/film/video in the Last Year:   . Barista in the Last Year:   Transportation Needs:   . Freight forwarder (Medical):   Marland Kitchen Lack of  Transportation (Non-Medical):   Physical Activity:   . Days of Exercise per Week:   . Minutes of Exercise per Session:   Stress:   . Feeling of Stress :   Social Connections:   . Frequency of Communication with Friends and Family:   . Frequency of Social Gatherings with Friends and Family:   . Attends Religious Services:   . Active Member of Clubs or Organizations:   . Attends Banker Meetings:   Marland Kitchen Marital Status:   Intimate Partner Violence:   . Fear of Current or Ex-Partner:   . Emotionally Abused:   Marland Kitchen Physically Abused:   . Sexually Abused:     SDOH:  SDOH Screenings   Alcohol Screen: Low Risk   . Last Alcohol Screening Score (AUDIT): 0  Depression (PHQ2-9): Medium Risk  . PHQ-2 Score: 10  Financial Resource Strain:   . Difficulty of Paying Living Expenses:   Food Insecurity:   . Worried About Programme researcher, broadcasting/film/video in the Last Year:   . [employer] of Food in the Last Year:   Housing:   . Last Housing Risk Score:   Physical Activity:   . Days of Exercise per Week:   . Minutes of Exercise per Session:   Social Connections:   . Frequency of Communication with Friends and Family:   . Frequency of Social Gatherings with Friends and Family:   . Attends Religious Services:   . Active Member of Clubs or Organizations:   . Attends Banker Meetings:   Marland Kitchen Marital Status:   Stress:   . Feeling of Stress :   Tobacco Use: Low Risk   . Smoking Tobacco Use: Never Smoker  . Smokeless Tobacco Use: Never Used  Transportation Needs:   . Freight forwarder (Medical):   Marland Kitchen Lack of Transportation (Non-Medical):     Last Labs:  Admission on 05/19/2020  Component Date Value Ref Range Status  . SARS Coronavirus 2 Ag 05/19/2020 NEGATIVE  NEGATIVE Final   Comment: (NOTE) SARS-CoV-2 antigen NOT DETECTED.   Negative results are presumptive.  Negative results do not preclude SARS-CoV-2 infection and should not be used as the sole basis for treatment or other  patient management decisions, including infection  control decisions, particularly in the presence of clinical signs and  symptoms consistent with COVID-19, or in those who have been in contact with the virus.  Negative results must be combined with clinical observations, patient history, and epidemiological information. The expected result is Negative.  Fact Sheet for Patients: https://sanders-williams.net/  Fact Sheet for Healthcare Providers: https://martinez.com/   This test is not yet approved or cleared by the [place]  FDA and  has been authorized for detection and/or diagnosis of SARS-CoV-2 by FDA under an Emergency Use Authorization (EUA).  This EUA will remain in effect (meaning this test can be used) for the duration of  the C                          OVID-19 declaration under Section 564(b)(1) of the Act, 21 U.S.C. section 360bbb-3(b)(1), unless the authorization is terminated or revoked sooner.    . Preg Test, Ur 05/19/2020 NEGATIVE  NEGATIVE Final   Comment:        THE SENSITIVITY OF THIS METHODOLOGY IS >24 mIU/mL   Admission on 05/11/2020, Discharged on 05/12/2020  Component Date Value Ref Range Status  . Sodium 05/11/2020 137  135 - 145 mmol/L Final  . Potassium 05/11/2020 3.4* 3.5 - 5.1 mmol/L Final  . Chloride 05/11/2020 103  98 - 111 mmol/L Final  . CO2 05/11/2020 24  22 - 32 mmol/L Final  . Glucose, Bld 05/11/2020 82  70 - 99 mg/dL Final   Glucose reference range applies only to samples taken after fasting for at least 8 hours.  . BUN 05/11/2020 14  6 - 20 mg/dL Final  . Creatinine, Ser 05/11/2020 0.71  0.44 - 1.00 mg/dL Final  . Calcium 16/08/9603 9.4  8.9 - 10.3 mg/dL Final  . Total Protein 05/11/2020 6.9  6.5 - 8.1 g/dL Final  . Albumin 54/07/8118 3.9  3.5 - 5.0 g/dL Final  . AST 14/78/2956 19  15 - 41 U/L Final  . ALT 05/11/2020 15  0 - 44 U/L Final  . Alkaline Phosphatase 05/11/2020 42  38 - 126 U/L Final  .  Total Bilirubin 05/11/2020 0.3  0.3 - 1.2 mg/dL Final  . GFR calc non Af Amer 05/11/2020 >60  >60 mL/min Final  . GFR calc Af Amer 05/11/2020 >60  >60 mL/min Final  . Anion gap 05/11/2020 10  5 - 15 Final   Performed at [facility], [address] [place], [place] [address]  . Alcohol, Ethyl (B) 05/11/2020 <10  <10 mg/dL Final   Comment: (NOTE) Lowest detectable limit for serum alcohol is 10 mg/dL.  For medical purposes only. Performed at [facility], [address], [place], [place] [address]   . Salicylate Lvl 05/11/2020 <7.0* 7.0 - 30.0 mg/dL Final   Performed at [facility], [address] [place], [place] [address]  . Acetaminophen (Tylenol), Serum 05/11/2020 <10* 10 - 30 ug/mL Final   Comment: (NOTE) Therapeutic concentrations vary significantly. A range of 10-30 ug/mL  may be an effective concentration for many patients. However, some  are best treated at concentrations outside of this range. Acetaminophen concentrations >150 ug/mL at 4 hours after ingestion  and >50 ug/mL at 12 hours after ingestion are often associated with  toxic reactions.  Performed at [facility], [address] [place], [place] [address]   . WBC 05/11/2020 5.2  4.0 - 10.5 K/uL Final  . RBC 05/11/2020 3.92  3.87 - 5.11 MIL/uL Final  . Hemoglobin 05/11/2020 11.0* 12.0 - 15.0 g/dL Final  . HCT 32/44/0102 34.6* 36 - 46 % Final  . MCV 05/11/2020 88.3  80.0 - 100.0 fL Final  . MCH 05/11/2020 28.1  26.0 - 34.0 pg Final  . MCHC 05/11/2020 31.8  30.0 - 36.0 g/dL Final  . RDW 72/53/6644 13.6  11.5 - 15.5 % Final  .  Platelets 05/11/2020 278  150 - 400 K/uL Final  . nRBC 05/11/2020 0.0  0.0 - 0.2 % Final   Performed at [facility], [address] [place], [place] [address]  . Opiates 05/11/2020 NONE DETECTED  NONE DETECTED Final  . Cocaine 05/11/2020 NONE DETECTED  NONE DETECTED Final  . Benzodiazepines 05/11/2020 NONE DETECTED  NONE DETECTED Final  . Amphetamines  05/11/2020 NONE DETECTED  NONE DETECTED Final  . Tetrahydrocannabinol 05/11/2020 NONE DETECTED  NONE DETECTED Final  . Barbiturates 05/11/2020 NONE DETECTED  NONE DETECTED Final   Comment: (NOTE) DRUG SCREEN FOR MEDICAL PURPOSES ONLY.  IF CONFIRMATION IS NEEDED FOR ANY PURPOSE, NOTIFY LAB WITHIN 5 DAYS.  LOWEST DETECTABLE LIMITS FOR URINE DRUG SCREEN Drug Class                     Cutoff (ng/mL) Amphetamine and metabolites    1000 Barbiturate and metabolites    200 Benzodiazepine                 200 Tricyclics and metabolites     300 Opiates and metabolites        300 Cocaine and metabolites        300 THC                            50 Performed at [facility], [address] [place], [place] [address]   . I-stat hCG, quantitative 05/11/2020 <5.0  <5 mIU/mL Final  . Comment 3 05/11/2020          Final   Comment:   GEST. AGE      CONC.  (mIU/mL)   <=1 WEEK        5 - 50     2 WEEKS       50 - 500     3 WEEKS       100 - 10,000     4 WEEKS     1,000 - 30,000        FEMALE AND NON-PREGNANT FEMALE:     LESS THAN 5 mIU/mL   Admission on 05/08/2020, Discharged on 05/09/2020  Component Date Value Ref Range Status  . Sodium 05/08/2020 137  135 - 145 mmol/L Final  . Potassium 05/08/2020 3.6  3.5 - 5.1 mmol/L Final  . Chloride 05/08/2020 103  98 - 111 mmol/L Final  . CO2 05/08/2020 25  22 - 32 mmol/L Final  . Glucose, Bld 05/08/2020 85  70 - 99 mg/dL Final   Glucose reference range applies only to samples taken after fasting for at least 8 hours.  . BUN 05/08/2020 10  6 - 20 mg/dL Final  . Creatinine, Ser 05/08/2020 0.57  0.44 - 1.00 mg/dL Final  . Calcium 26/71/2458 10.1  8.9 - 10.3 mg/dL Final  . Total Protein 05/08/2020 6.6  6.5 - 8.1 g/dL Final  . Albumin 09/98/3382 3.8  3.5 - 5.0 g/dL Final  . AST 50/53/9767 18  15 - 41 U/L Final  . ALT 05/08/2020 15  0 - 44 U/L Final  . Alkaline Phosphatase 05/08/2020 46  38 - 126 U/L Final  . Total Bilirubin 05/08/2020 0.6  0.3 -  1.2 mg/dL Final  . GFR calc non Af Amer 05/08/2020 >60  >60 mL/min Final  . GFR calc Af Amer 05/08/2020 >60  >60 mL/min Final  . Anion gap 05/08/2020 9  5 - 15  Final   Performed at [facility], [address], [place], [place] [address]  . Alcohol, Ethyl (B) 05/08/2020 <10  <10 mg/dL Final   Comment: (NOTE) Lowest detectable limit for serum alcohol is 10 mg/dL.  For medical purposes only. Performed at [facility], [address] [place], [place] [address]   . WBC 05/08/2020 5.4  4.0 - 10.5 K/uL Final  . RBC 05/08/2020 3.87  3.87 - 5.11 MIL/uL Final  . Hemoglobin 05/08/2020 11.0* 12.0 - 15.0 g/dL Final  . HCT 81/19/1478 33.9* 36 - 46 % Final  . MCV 05/08/2020 87.6  80.0 - 100.0 fL Final  . MCH 05/08/2020 28.4  26.0 - 34.0 pg Final  . MCHC 05/08/2020 32.4  30.0 - 36.0 g/dL Final  . RDW 29/56/2130 13.7  11.5 - 15.5 % Final  . Platelets 05/08/2020 303  150 - 400 K/uL Final  . nRBC 05/08/2020 0.0  0.0 - 0.2 % Final  . Neutrophils Relative % 05/08/2020 60  % Final  . Neutro Abs 05/08/2020 3.3  1.7 - 7.7 K/uL Final  . Lymphocytes Relative 05/08/2020 31  % Final  . Lymphs Abs 05/08/2020 1.7  0.7 - 4.0 K/uL Final  . Monocytes Relative 05/08/2020 9  % Final  . Monocytes Absolute 05/08/2020 0.5  0 - 1 K/uL Final  . Eosinophils Relative 05/08/2020 0  % Final  . Eosinophils Absolute 05/08/2020 0.0  0 - 0 K/uL Final  . Basophils Relative 05/08/2020 0  % Final  . Basophils Absolute 05/08/2020 0.0  0 - 0 K/uL Final  . Immature Granulocytes 05/08/2020 0  % Final  . Abs Immature Granulocytes 05/08/2020 0.01  0.00 - 0.07 K/uL Final   Performed at [facility], [address], [place], [place] [address]  . I-stat hCG, quantitative 05/08/2020 <5.0  <5 mIU/mL Final  . Comment 3 05/08/2020          Final   Comment:   GEST. AGE      CONC.  (mIU/mL)   <=1 WEEK        5 - 50     2 WEEKS       50 - 500     3 WEEKS       100 - 10,000     4 WEEKS     1,000 - 30,000        FEMALE AND  NON-PREGNANT FEMALE:     LESS THAN 5 mIU/mL   . Salicylate Lvl 05/08/2020 <7.0* 7.0 - 30.0 mg/dL Final   Performed at [facility], [address] [place], [place] [address]  . Acetaminophen (Tylenol), Serum 05/08/2020 <10* 10 - 30 ug/mL Final   Comment: (NOTE) Therapeutic concentrations vary significantly. A range of 10-30 ug/mL  may be an effective concentration for many patients. However, some  are best treated at concentrations outside of this range. Acetaminophen concentrations >150 ug/mL at 4 hours after ingestion  and >50 ug/mL at 12 hours after ingestion are often associated with  toxic reactions.  Performed at [facility], [address], [place], [place] [address]   . SARS Coronavirus 2 05/08/2020 NEGATIVE  NEGATIVE Final   Comment: (NOTE) SARS-CoV-2 target nucleic acids are NOT DETECTED.  The SARS-CoV-2 RNA is generally detectable in upper and lower respiratory specimens during the acute phase of infection. The lowest concentration of SARS-CoV-2 viral copies this assay can detect is 250 copies / mL. A negative result  does not preclude SARS-CoV-2 infection and should not be used as the sole basis for treatment or other patient management decisions.  A negative result may occur with improper specimen collection / handling, submission of specimen other than nasopharyngeal swab, presence of viral mutation(s) within the areas targeted by this assay, and inadequate number of viral copies (<250 copies / mL). A negative result must be combined with clinical observations, patient history, and epidemiological information.  Fact Sheet for Patients:   BoilerBrush.com.cy  Fact Sheet for Healthcare Providers: https://pope.com/  This test is not yet approved or                           cleared by the [place] FDA and has been authorized for detection and/or diagnosis of SARS-CoV-2 by FDA under an Emergency Use  Authorization (EUA).  This EUA will remain in effect (meaning this test can be used) for the duration of the COVID-19 declaration under Section 564(b)(1) of the Act, 21 U.S.C. section 360bbb-3(b)(1), unless the authorization is terminated or revoked sooner.  Performed at [facility], [address] [place], [place] [address]   Admission on 05/02/2020, Discharged on 05/03/2020  Component Date Value Ref Range Status  . Sodium 05/03/2020 137  135 - 145 mmol/L Final  . Potassium 05/03/2020 3.4* 3.5 - 5.1 mmol/L Final  . Chloride 05/03/2020 104  98 - 111 mmol/L Final  . CO2 05/03/2020 23  22 - 32 mmol/L Final  . Glucose, Bld 05/03/2020 123* 70 - 99 mg/dL Final   Glucose reference range applies only to samples taken after fasting for at least 8 hours.  . BUN 05/03/2020 9  6 - 20 mg/dL Final  . Creatinine, Ser 05/03/2020 0.65  0.44 - 1.00 mg/dL Final  . Calcium 60/45/4098 9.1  8.9 - 10.3 mg/dL Final  . Total Protein 05/03/2020 6.6  6.5 - 8.1 g/dL Final  . Albumin 11/91/4782 3.7  3.5 - 5.0 g/dL Final  . AST 95/62/1308 20  15 - 41 U/L Final  . ALT 05/03/2020 16  0 - 44 U/L Final  . Alkaline Phosphatase 05/03/2020 39  38 - 126 U/L Final  . Total Bilirubin 05/03/2020 0.5  0.3 - 1.2 mg/dL Final  . GFR calc non Af Amer 05/03/2020 >60  >60 mL/min Final  . GFR calc Af Amer 05/03/2020 >60  >60 mL/min Final  . Anion gap 05/03/2020 10  5 - 15 Final   Performed at [facility], [address] [place], [place] [address]  . Alcohol, Ethyl (B) 05/03/2020 <10  <10 mg/dL Final   Comment: (NOTE) Lowest detectable limit for serum alcohol is 10 mg/dL.  For medical purposes only. Performed at [facility], [address], [place], [place] [address]   . Salicylate Lvl 05/03/2020 <7.0* 7.0 - 30.0 mg/dL Final   Performed at [facility], [address] [place], [place] [address]  . Acetaminophen (Tylenol), Serum 05/03/2020 <10* 10 - 30 ug/mL Final   Comment: (NOTE) Therapeutic  concentrations vary significantly. A range of 10-30 ug/mL  may be an effective concentration for many patients. However, some  are best treated at concentrations outside of this range. Acetaminophen concentrations >150 ug/mL at 4 hours after ingestion  and >50 ug/mL at 12 hours after ingestion are often associated with  toxic reactions.  Performed at [facility], [address], [place], [place] [address]   . WBC  05/03/2020 4.9  4.0 - 10.5 K/uL Final  . RBC 05/03/2020 3.89  3.87 - 5.11 MIL/uL Final  . Hemoglobin 05/03/2020 10.8* 12.0 - 15.0 g/dL Final  . HCT 95/28/4132 34.7* 36 - 46 % Final  . MCV 05/03/2020 89.2  80.0 - 100.0 fL Final  . MCH 05/03/2020 27.8  26.0 - 34.0 pg Final  . MCHC 05/03/2020 31.1  30.0 - 36.0 g/dL Final  . RDW 44/11/270 13.6  11.5 - 15.5 % Final  . Platelets 05/03/2020 289  150 - 400 K/uL Final  . nRBC 05/03/2020 0.0  0.0 - 0.2 % Final   Performed at [facility], [address] [place], [place] [address]  . Opiates 05/02/2020 NONE DETECTED  NONE DETECTED Final  . Cocaine 05/02/2020 NONE DETECTED  NONE DETECTED Final  . Benzodiazepines 05/02/2020 NONE DETECTED  NONE DETECTED Final  . Amphetamines 05/02/2020 NONE DETECTED  NONE DETECTED Final  . Tetrahydrocannabinol 05/02/2020 NONE DETECTED  NONE DETECTED Final  . Barbiturates 05/02/2020 NONE DETECTED  NONE DETECTED Final   Comment: (NOTE) DRUG SCREEN FOR MEDICAL PURPOSES ONLY.  IF CONFIRMATION IS NEEDED FOR ANY PURPOSE, NOTIFY LAB WITHIN 5 DAYS.  LOWEST DETECTABLE LIMITS FOR URINE DRUG SCREEN Drug Class                     Cutoff (ng/mL) Amphetamine and metabolites    1000 Barbiturate and metabolites    200 Benzodiazepine                 200 Tricyclics and metabolites     300 Opiates and metabolites        300 Cocaine and metabolites        300 THC                            50 Performed at [facility], [address] [place], [place] [address]   . I-stat hCG, quantitative  05/03/2020 <5.0  <5 mIU/mL Final  . Comment 3 05/03/2020          Final   Comment:   GEST. AGE      CONC.  (mIU/mL)   <=1 WEEK        5 - 50     2 WEEKS       50 - 500     3 WEEKS       100 - 10,000     4 WEEKS     1,000 - 30,000        FEMALE AND NON-PREGNANT FEMALE:     LESS THAN 5 mIU/mL   . SARS Coronavirus 2 05/03/2020 NEGATIVE  NEGATIVE Final   Comment: SARS-CoV-2 target nucleic acids are NOT DETECTED. The SARS-CoV-2 RNA is generally detectable in upper and lower respiratory specimens during the acute phase of infection. The lowest concentration of SARS-CoV-2 viral copies this assay can detect is 250 copies / mL. A negative result does not preclude SARS-CoV-2 infection and should not be used as the sole basis for treatment or other patient management decisions. Performed at [facility], [address], [place], [place] [address]   Admission on 04/23/2020, Discharged on 04/24/2020  Component Date Value Ref Range Status  . WBC 04/23/2020 5.2  4.0 - 10.5 K/uL Final  . RBC 04/23/2020 3.95  3.87 - 5.11 MIL/uL Final  . Hemoglobin 04/23/2020 11.4* 12.0 - 15.0 g/dL Final  . HCT  04/23/2020 34.9* 36 - 46 % Final  . MCV 04/23/2020 88.4  80.0 - 100.0 fL Final  . MCH 04/23/2020 28.9  26.0 - 34.0 pg Final  . MCHC 04/23/2020 32.7  30.0 - 36.0 g/dL Final  . RDW 16/08/9603 13.4  11.5 - 15.5 % Final  . Platelets 04/23/2020 295  150 - 400 K/uL Final  . nRBC 04/23/2020 0.0  0.0 - 0.2 % Final  . Neutrophils Relative % 04/23/2020 49  % Final  . Neutro Abs 04/23/2020 2.5  1.7 - 7.7 K/uL Final  . Lymphocytes Relative 04/23/2020 41  % Final  . Lymphs Abs 04/23/2020 2.1  0.7 - 4.0 K/uL Final  . Monocytes Relative 04/23/2020 9  % Final  . Monocytes Absolute 04/23/2020 0.5  0 - 1 K/uL Final  . Eosinophils Relative 04/23/2020 1  % Final  . Eosinophils Absolute 04/23/2020 0.1  0 - 0 K/uL Final  . Basophils Relative 04/23/2020 0  % Final  . Basophils Absolute 04/23/2020 0.0  0 - 0 K/uL Final  .  Immature Granulocytes 04/23/2020 0  % Final  . Abs Immature Granulocytes 04/23/2020 0.01  0.00 - 0.07 K/uL Final   Performed at [facility], [address], [place], [place] [address]  . Sodium 04/23/2020 136  135 - 145 mmol/L Final  . Potassium 04/23/2020 3.3* 3.5 - 5.1 mmol/L Final  . Chloride 04/23/2020 100  98 - 111 mmol/L Final  . CO2 04/23/2020 25  22 - 32 mmol/L Final  . Glucose, Bld 04/23/2020 79  70 - 99 mg/dL Final   Glucose reference range applies only to samples taken after fasting for at least 8 hours.  . BUN 04/23/2020 9  6 - 20 mg/dL Final  . Creatinine, Ser 04/23/2020 0.60  0.44 - 1.00 mg/dL Final  . Calcium 11/91/4782 9.5  8.9 - 10.3 mg/dL Final  . GFR calc non Af Amer 04/23/2020 >60  >60 mL/min Final  . GFR calc Af Amer 04/23/2020 >60  >60 mL/min Final  . Anion gap 04/23/2020 11  5 - 15 Final   Performed at [facility], [address] [place], [place] [address]  . Alcohol, Ethyl (B) 04/23/2020 <10  <10 mg/dL Final   Comment: (NOTE) Lowest detectable limit for serum alcohol is 10 mg/dL.  For medical purposes only. Performed at [facility], [address] [place], [place] [address]   . Opiates 04/23/2020 NONE DETECTED  NONE DETECTED Final  . Cocaine 04/23/2020 NONE DETECTED  NONE DETECTED Final  . Benzodiazepines 04/23/2020 NONE DETECTED  NONE DETECTED Final  . Amphetamines 04/23/2020 NONE DETECTED  NONE DETECTED Final  . Tetrahydrocannabinol 04/23/2020 NONE DETECTED  NONE DETECTED Final  . Barbiturates 04/23/2020 NONE DETECTED  NONE DETECTED Final   Comment: (NOTE) DRUG SCREEN FOR MEDICAL PURPOSES ONLY.  IF CONFIRMATION IS NEEDED FOR ANY PURPOSE, NOTIFY LAB WITHIN 5 DAYS.  LOWEST DETECTABLE LIMITS FOR URINE DRUG SCREEN Drug Class                     Cutoff (ng/mL) Amphetamine and metabolites    1000 Barbiturate and metabolites    200 Benzodiazepine                 200 Tricyclics and metabolites      300 Opiates and metabolites        300 Cocaine and metabolites        300 THC  50 Performed at [facility], [address] [place], [place] [address]   . SARS Coronavirus 2 04/23/2020 NEGATIVE  NEGATIVE Final   Comment: (NOTE) SARS-CoV-2 target nucleic acids are NOT DETECTED.  The SARS-CoV-2 RNA is generally detectable in upper and lower respiratory specimens during the acute phase of infection. The lowest concentration of SARS-CoV-2 viral copies this assay can detect is 250 copies / mL. A negative result does not preclude SARS-CoV-2 infection and should not be used as the sole basis for treatment or other patient management decisions.  A negative result may occur with improper specimen collection / handling, submission of specimen other than nasopharyngeal swab, presence of viral mutation(s) within the areas targeted by this assay, and inadequate number of viral copies (<250 copies / mL). A negative result must be combined with clinical observations, patient history, and epidemiological information.  Fact Sheet for Patients:   BoilerBrush.com.cy  Fact Sheet for Healthcare Providers: https://pope.com/  This test is not yet approved or                           cleared by the [place] FDA and has been authorized for detection and/or diagnosis of SARS-CoV-2 by FDA under an Emergency Use Authorization (EUA).  This EUA will remain in effect (meaning this test can be used) for the duration of the COVID-19 declaration under Section 564(b)(1) of the Act, 21 U.S.C. section 360bbb-3(b)(1), unless the authorization is terminated or revoked sooner.  Performed at [facility], [address] [place], [place] [address]   . I-stat hCG, quantitative 04/23/2020 <5.0  <5 mIU/mL Final  . Comment 3 04/23/2020          Final   Comment:   GEST. AGE      CONC.  (mIU/mL)   <=1  WEEK        5 - 50     2 WEEKS       50 - 500     3 WEEKS       100 - 10,000     4 WEEKS     1,000 - 30,000        FEMALE AND NON-PREGNANT FEMALE:     LESS THAN 5 mIU/mL   Admission on 04/18/2020, Discharged on 04/19/2020  Component Date Value Ref Range Status  . Lipase 04/18/2020 29  11 - 51 U/L Final   Performed at [facility], [address], [place], [place] [address]  . Sodium 04/18/2020 139  135 - 145 mmol/L Final  . Potassium 04/18/2020 3.7  3.5 - 5.1 mmol/L Final  . Chloride 04/18/2020 105  98 - 111 mmol/L Final  . CO2 04/18/2020 23  22 - 32 mmol/L Final  . Glucose, Bld 04/18/2020 105* 70 - 99 mg/dL Final   Glucose reference range applies only to samples taken after fasting for at least 8 hours.  . BUN 04/18/2020 12  6 - 20 mg/dL Final  . Creatinine, Ser 04/18/2020 0.65  0.44 - 1.00 mg/dL Final  . Calcium 29/52/8413 9.7  8.9 - 10.3 mg/dL Final  . Total Protein 04/18/2020 7.3  6.5 - 8.1 g/dL Final  . Albumin 24/40/1027 4.0  3.5 - 5.0 g/dL Final  . AST 25/36/6440 22  15 - 41 U/L Final  . ALT 04/18/2020 16  0 - 44 U/L Final  . Alkaline Phosphatase 04/18/2020 45  38 - 126 U/L Final  . Total  Bilirubin 04/18/2020 0.7  0.3 - 1.2 mg/dL Final  . GFR calc non Af Amer 04/18/2020 >60  >60 mL/min Final  . GFR calc Af Amer 04/18/2020 >60  >60 mL/min Final  . Anion gap 04/18/2020 11  5 - 15 Final   Performed at [facility], [address], [place], [place] [address]  . WBC 04/18/2020 4.7  4.0 - 10.5 K/uL Final  . RBC 04/18/2020 4.40  3.87 - 5.11 MIL/uL Final  . Hemoglobin 04/18/2020 12.4  12.0 - 15.0 g/dL Final  . HCT 60/45/4098 39.3  36 - 46 % Final  . MCV 04/18/2020 89.3  80.0 - 100.0 fL Final  . MCH 04/18/2020 28.2  26.0 - 34.0 pg Final  . MCHC 04/18/2020 31.6  30.0 - 36.0 g/dL Final  . RDW 11/91/4782 13.6  11.5 - 15.5 % Final  . Platelets 04/18/2020 309  150 - 400 K/uL Final  . nRBC 04/18/2020 0.0  0.0 - 0.2 % Final   Performed at [facility], [address], [place], [place] [address]  . Color, Urine 04/18/2020 STRAW* YELLOW Final  . APPearance 04/18/2020 CLEAR  CLEAR Final  . Specific Gravity, Urine 04/18/2020 1.003* 1.005 - 1.030 Final  . pH 04/18/2020 6.0  5.0 - 8.0 Final  . Glucose, UA 04/18/2020 NEGATIVE  NEGATIVE mg/dL Final  . Hgb urine dipstick 04/18/2020 SMALL* NEGATIVE Final  . Bilirubin Urine 04/18/2020 NEGATIVE  NEGATIVE Final  . Ketones, ur 04/18/2020 NEGATIVE  NEGATIVE mg/dL Final  . Protein, ur 30/86/5784 NEGATIVE  NEGATIVE mg/dL Final  . Nitrite 69/62/9528 NEGATIVE  NEGATIVE Final  . Leukocytes,Ua 04/18/2020 TRACE* NEGATIVE Final  . RBC / HPF 04/18/2020 0-5  0 - 5 RBC/hpf Final  . WBC, UA 04/18/2020 0-5  0 - 5 WBC/hpf Final  . Bacteria, UA 04/18/2020 RARE* NONE SEEN Final  . Squamous Epithelial / LPF 04/18/2020 6-10  0 - 5 Final   Performed at [facility], [address] [place], [place] [address]  . I-stat hCG, quantitative 04/18/2020 <5.0  <5 mIU/mL Final  . Comment 3 04/18/2020          Final   Comment:   GEST. AGE      CONC.  (mIU/mL)   <=1 WEEK        5 - 50     2 WEEKS       50 - 500     3 WEEKS       100 - 10,000     4 WEEKS     1,000 - 30,000        FEMALE AND NON-PREGNANT FEMALE:     LESS THAN 5 mIU/mL   . Alcohol, Ethyl (B) 04/18/2020 <10  <10 mg/dL Final   Comment: (NOTE) Lowest detectable limit for serum alcohol is 10 mg/dL.  For medical purposes only. Performed at [facility], [address], [place], [place] [address]   . Opiates 04/18/2020 NONE DETECTED  NONE DETECTED Final  . Cocaine 04/18/2020 NONE DETECTED  NONE DETECTED Final  . Benzodiazepines 04/18/2020 NONE DETECTED  NONE DETECTED Final  . Amphetamines 04/18/2020 NONE DETECTED  NONE DETECTED Final  . Tetrahydrocannabinol 04/18/2020 NONE DETECTED  NONE DETECTED Final  . Barbiturates 04/18/2020 NONE DETECTED  NONE DETECTED Final   Comment: (NOTE) DRUG SCREEN FOR MEDICAL PURPOSES ONLY.  IF CONFIRMATION IS NEEDED FOR ANY PURPOSE,  NOTIFY LAB WITHIN 5 DAYS.  LOWEST DETECTABLE LIMITS FOR URINE DRUG  SCREEN Drug Class                     Cutoff (ng/mL) Amphetamine and metabolites    1000 Barbiturate and metabolites    200 Benzodiazepine                 200 Tricyclics and metabolites     300 Opiates and metabolites        300 Cocaine and metabolites        300 THC                            50 Performed at [facility], [address], [place], [place] [address]   . SARS Coronavirus 2 04/18/2020 NEGATIVE  NEGATIVE Final   Comment: (NOTE) SARS-CoV-2 target nucleic acids are NOT DETECTED.  The SARS-CoV-2 RNA is generally detectable in upper and lower respiratory specimens during the acute phase of infection. The lowest concentration of SARS-CoV-2 viral copies this assay can detect is 250 copies / mL. A negative result does not preclude SARS-CoV-2 infection and should not be used as the sole basis for treatment or other patient management decisions.  A negative result may occur with improper specimen collection / handling, submission of specimen other than nasopharyngeal swab, presence of viral mutation(s) within the areas targeted by this assay, and inadequate number of viral copies (<250 copies / mL). A negative result must be combined with clinical observations, patient history, and epidemiological information.  Fact Sheet for Patients:   BoilerBrush.com.cy  Fact Sheet for Healthcare Providers: https://pope.com/  This test is not yet approved or                           cleared by the [place] FDA and has been authorized for detection and/or diagnosis of SARS-CoV-2 by FDA under an Emergency Use Authorization (EUA).  This EUA will remain in effect (meaning this test can be used) for the duration of the COVID-19 declaration under Section 564(b)(1) of the Act, 21 U.S.C. section 360bbb-3(b)(1), unless the authorization is terminated or revoked  sooner.  Performed at [facility], [address] [place], [place] [address]   Admission on 03/27/2020, Discharged on 03/28/2020  Component Date Value Ref Range Status  . SARS Coronavirus 2 03/27/2020 NEGATIVE  NEGATIVE Final   Comment: (NOTE) SARS-CoV-2 target nucleic acids are NOT DETECTED. The SARS-CoV-2 RNA is generally detectable in upper and lower respiratory specimens during the acute phase of infection. The lowest concentration of SARS-CoV-2 viral copies this assay can detect is 250 copies / mL. A negative result does not preclude SARS-CoV-2 infection and should not be used as the sole basis for treatment or other patient management decisions.  A negative result may occur with improper specimen collection / handling, submission of specimen other than nasopharyngeal swab, presence of viral mutation(s) within the areas targeted by this assay, and inadequate number of viral copies (<250 copies / mL). A negative result must be combined with clinical observations, patient history, and epidemiological information. Fact Sheet for Patients:   BoilerBrush.com.cy Fact Sheet for Healthcare Providers: https://pope.com/ This test is not yet approved or cleared                           by the [place] FDA and has been authorized for detection and/or diagnosis of SARS-CoV-2  by FDA under an Emergency Use Authorization (EUA).  This EUA will remain in effect (meaning this test can be used) for the duration of the COVID-19 declaration under Section 564(b)(1) of the Act, 21 U.S.C. section 360bbb-3(b)(1), unless the authorization is terminated or revoked sooner. Performed at [facility], [address] [place], [place] [address]   Admission on 03/20/2020, Discharged on 03/21/2020  Component Date Value Ref Range Status  . SARS Coronavirus 2 03/20/2020 NEGATIVE  NEGATIVE Final   Comment: (NOTE) SARS-CoV-2  target nucleic acids are NOT DETECTED. The SARS-CoV-2 RNA is generally detectable in upper and lower respiratory specimens during the acute phase of infection. The lowest concentration of SARS-CoV-2 viral copies this assay can detect is 250 copies / mL. A negative result does not preclude SARS-CoV-2 infection and should not be used as the sole basis for treatment or other patient management decisions.  A negative result may occur with improper specimen collection / handling, submission of specimen other than nasopharyngeal swab, presence of viral mutation(s) within the areas targeted by this assay, and inadequate number of viral copies (<250 copies / mL). A negative result must be combined with clinical observations, patient history, and epidemiological information. Fact Sheet for Patients:   BoilerBrush.com.cyhttps://www.fda.gov/media/136312/download Fact Sheet for Healthcare Providers: https://pope.com/https://www.fda.gov/media/136313/download This test is not yet approved or cleared                           by the [place] FDA and has been authorized for detection and/or diagnosis of SARS-CoV-2 by FDA under an Emergency Use Authorization (EUA).  This EUA will remain in effect (meaning this test can be used) for the duration of the COVID-19 declaration under Section 564(b)(1) of the Act, 21 U.S.C. section 360bbb-3(b)(1), unless the authorization is terminated or revoked sooner. Performed at [facility], [address] [place], [place] [address]   Admission on 03/10/2020, Discharged on 03/10/2020  Component Date Value Ref Range Status  . Sodium 03/10/2020 138  135 - 145 mmol/L Final  . Potassium 03/10/2020 3.8  3.5 - 5.1 mmol/L Final  . Chloride 03/10/2020 105  98 - 111 mmol/L Final  . CO2 03/10/2020 25  22 - 32 mmol/L Final  . Glucose, Bld 03/10/2020 100* 70 - 99 mg/dL Final   Glucose reference range applies only to samples taken after fasting for at least 8 hours.  . BUN 03/10/2020 13   6 - 20 mg/dL Final  . Creatinine, Ser 03/10/2020 0.71  0.44 - 1.00 mg/dL Final  . Calcium 09/81/191405/03/2020 9.3  8.9 - 10.3 mg/dL Final  . Total Protein 03/10/2020 6.6  6.5 - 8.1 g/dL Final  . Albumin 78/29/562105/03/2020 3.8  3.5 - 5.0 g/dL Final  . AST 30/86/578405/03/2020 17  15 - 41 U/L Final  . ALT 03/10/2020 16  0 - 44 U/L Final  . Alkaline Phosphatase 03/10/2020 48  38 - 126 U/L Final  . Total Bilirubin 03/10/2020 0.2* 0.3 - 1.2 mg/dL Final  . GFR calc non Af Amer 03/10/2020 >60  >60 mL/min Final  . GFR calc Af Amer 03/10/2020 >60  >60 mL/min Final  . Anion gap 03/10/2020 8  5 - 15 Final   Performed at [facility], [address] [place], [place] [address]  . Alcohol, Ethyl (B) 03/10/2020 <10  <10 mg/dL Final   Comment: (NOTE) Lowest detectable limit for serum alcohol is 10 mg/dL. For medical purposes only. Performed at  [facility], [address], [place], [place] [address]   . Salicylate Lvl 03/10/2020 <7.0* 7.0 - 30.0 mg/dL Final   Performed at [facility], [address], [place], [place] [address]  . Acetaminophen (Tylenol), Serum 03/10/2020 <10* 10 - 30 ug/mL Final   Comment: (NOTE) Therapeutic concentrations vary significantly. A range of 10-30 ug/mL  may be an effective concentration for many patients. However, some  are best treated at concentrations outside of this range. Acetaminophen concentrations >150 ug/mL at 4 hours after ingestion  and >50 ug/mL at 12 hours after ingestion are often associated with  toxic reactions. Performed at [facility], [address], [place], [place] [address]   . WBC 03/10/2020 7.0  4.0 - 10.5 K/uL Final  . RBC 03/10/2020 4.18  3.87 - 5.11 MIL/uL Final  . Hemoglobin 03/10/2020 11.9* 12.0 - 15.0 g/dL Final  . HCT 47/42/5956 38.3  36 - 46 % Final  . MCV 03/10/2020 91.6  80.0 - 100.0 fL Final  . MCH 03/10/2020 28.5  26.0 - 34.0 pg Final  . MCHC 03/10/2020 31.1  30.0 - 36.0 g/dL Final  . RDW 38/75/6433 13.3  11.5 - 15.5 % Final   . Platelets 03/10/2020 306  150 - 400 K/uL Final  . nRBC 03/10/2020 0.0  0.0 - 0.2 % Final   Performed at [facility], [address], [place], [place] [address]  . SARS Coronavirus 2 by RT PCR 03/10/2020 NEGATIVE  NEGATIVE Final   Comment: (NOTE) SARS-CoV-2 target nucleic acids are NOT DETECTED. The SARS-CoV-2 RNA is generally detectable in upper respiratoy specimens during the acute phase of infection. The lowest concentration of SARS-CoV-2 viral copies this assay can detect is 131 copies/mL. A negative result does not preclude SARS-Cov-2 infection and should not be used as the sole basis for treatment or other patient management decisions. A negative result may occur with  improper specimen collection/handling, submission of specimen other than nasopharyngeal swab, presence of viral mutation(s) within the areas targeted by this assay, and inadequate number of viral copies (<131 copies/mL). A negative result must be combined with clinical observations, patient history, and epidemiological information. The expected result is Negative. Fact Sheet for Patients:  https://www.moore.com/ Fact Sheet for Healthcare Providers:  https://www.young.biz/ This test is not yet ap                          proved or cleared by the [place] FDA and  has been authorized for detection and/or diagnosis of SARS-CoV-2 by FDA under an Emergency Use Authorization (EUA). This EUA will remain  in effect (meaning this test can be used) for the duration of the COVID-19 declaration under Section 564(b)(1) of the Act, 21 U.S.C. section 360bbb-3(b)(1), unless the authorization is terminated or revoked sooner.   . Influenza A by PCR 03/10/2020 NEGATIVE  NEGATIVE Final  . Influenza B by PCR 03/10/2020 NEGATIVE  NEGATIVE Final   Comment: (NOTE) The Xpert Xpress SARS-CoV-2/FLU/RSV assay is intended as an aid in  the diagnosis of influenza from Nasopharyngeal swab  specimens and  should not be used as a sole basis for treatment. Nasal washings and  aspirates are unacceptable for Xpert Xpress SARS-CoV-2/FLU/RSV  testing. Fact Sheet for Patients: https://www.moore.com/ Fact Sheet for Healthcare Providers: https://www.young.biz/ This test is not yet approved or cleared by the [place] FDA and  has been authorized for detection and/or diagnosis of SARS-CoV-2 by  FDA under an Emergency Use Authorization (EUA). This EUA will remain  in effect (meaning this test can be used) for the duration of the  Covid-19 declaration under Section 564(b)(1) of the Act, 21  U.S.C. section 360bbb-3(b)(1), unless the authorization is  terminated or revoked. Performed at [facility], [address], [place], [place] [address]   Admission on 02/14/2020, Discharged on 02/14/2020  Component Date Value Ref Range Status  . I-stat hCG, quantitative 02/14/2020 <5.0  <5 mIU/mL Final  . Comment 3 02/14/2020          Final   Comment:   GEST. AGE      CONC.  (mIU/mL)   <=1 WEEK        5 - 50     2 WEEKS       50 - 500     3 WEEKS       100 - 10,000     4 WEEKS     1,000 - 30,000        FEMALE AND NON-PREGNANT FEMALE:     LESS THAN 5 mIU/mL   . Sodium 02/14/2020 139  135 - 145 mmol/L Final  . Potassium 02/14/2020 3.5  3.5 - 5.1 mmol/L Final  . Chloride 02/14/2020 101  98 - 111 mmol/L Final  . BUN 02/14/2020 9  6 - 20 mg/dL Final  . Creatinine, Ser 02/14/2020 0.60  0.44 - 1.00 mg/dL Final  . Glucose, Bld 60/45/4098 81  70 - 99 mg/dL Final   Glucose reference range applies only to samples taken after fasting for at least 8 hours.  . Calcium, Ion 02/14/2020 1.25  1.15 - 1.40 mmol/L Final  . TCO2 02/14/2020 27  22 - 32 mmol/L Final  . Hemoglobin 02/14/2020 12.6  12.0 - 15.0 g/dL Final  . HCT 11/91/4782 37.0  36 - 46 % Final  There may be more visits with results that are not included.    Allergies: Patient has no known  allergies.  PTA Medications: (Not in a hospital admission)   Medical Decision Making  Admit to Continuous Observation. Inpatient treatment recommended, pending bed placement. Restart home medications: Cogentin 1 mg PO BID, Zyprexa 15 mg PO daily at bedtime, Risperdal 2 mg PO BID, Zoloft 50 mg PO daily, and Trazodone 150 mg PO at bedtime as needed.  Invega IM 234 mg due on 05/31/20.    Recommendations  Based on my evaluation the patient does not appear to have an emergency medical condition.  Maryfrances Bunnell, FNP 05/19/20  12:11 PM

## 2020-05-19 NOTE — BH Assessment (Signed)
Per Percell Boston, RN Director/ [facility], patient has been accepted to [facility], bed 500-2 ; Accepting provider is Dr. Lucianne Muss, MD; Attending provider is Dr. Jola Babinski, MD.   Patient can arrive at 8:30pm. Number for report is [phone].   Reola Calkins, NP notified.    Baldo Daub, MSW, LCSW Clinical Social Worker [facility]

## 2020-05-19 NOTE — BH Assessment (Addendum)
Assessment Note  [name] [name] is an 44 y.o. female who presents to [facility] voluntarily for treatment. pt presents alert and oriented x 3, depressed, paranoid but cooperative, and mood-congruent with affect. The pt does not appear to be responding to internal or external stimuli. Pt expressed delusional content around hearing the voice of god. Pt states "I need help with getting this man away from me and out of my head". Pt reports a hx of AH and expressed her symptom to increase over the weekend causing her to feel anxious. Pt identified her recent intake for ACT, lack of family support and commands given by [facility] as the triggers to her anxiety. Pt reports god to say things such as; your stupid, your dead, I want you for sex, just kill yourself and they don't like you. Pt expressed endorsing SI yesterday with the plan to [place] outside her therapist office or take a lot of pills. Pt denies any previous attempts. Pt expressed the AH to make her want to hit herself in the face. Pt reports hx of depression and paranoia. Pt reports to be eating and sleeping "ok". Pt states "I'm not crazy I just need help getting god's voice out my head". Pt reports an INPT hx with multiple hospitals ([facility], [facility]) and an OPT hx with [employer] and [facility]. Pt reports recently completing an intake to receive ACT services and expressed feeling as though she does not need the services. Pt denies any SA. Pt reports noncompliance with medications since Monday evening but was unclear as to why. Pt denied a family hx of MH and reports her deceased mom to have a hx of SA. Pt denies any current SI/HI/VH but was unable to contract for safety stating "I don't know what I would do I just need help". Pt expressed to have no collateral to provide today.   Per Garlan Fillers Pt is recommended for overnight observation   Diagnosis: Paranoid Schizophrenia   Past Medical History:  Past Medical History:  Diagnosis Date  . Arthritis   .  Depression   . Hallucinations   . Paranoid schizophrenia (HCC)   . Schizoaffective disorder (HCC)     No past surgical history on file.  Family History: No family history on file.  Social History:  reports that she has never smoked. She has never used smokeless tobacco. She reports previous alcohol use. She reports previous drug use.  Additional Social History:  Alcohol / Drug Use Pain Medications: see mar Prescriptions: see mar Over the Counter: see mar History of alcohol / drug use?: No history of alcohol / drug abuse  CIWA: CIWA-Ar BP: 118/87 Pulse Rate: 98 COWS:    Allergies: No Known Allergies  Home Medications:  No medications prior to admission.    OB/GYN Status:  No LMP recorded.  General Assessment Data Location of Assessment: [facility] ED TTS Assessment: In system Is this a Tele or Face-to-Face Assessment?: Face-to-Face Is this an Initial Assessment or a Re-assessment for this encounter?: Initial Assessment Patient Accompanied by:: N/A Language Other than English: No Living Arrangements: Other (Comment) (Private home ) What gender do you identify as?: Female Date Telepsych consult ordered in [facility]: 05/19/20 Time Telepsych consult ordered in [facility]: 0831 Marital status: Single Maiden name: n/a Pregnancy Status: No Living Arrangements: Alone Can pt return to current living arrangement?: Yes Admission Status: Voluntary Petitioner:  (n/a) Is patient capable of signing voluntary admission?: Yes Referral Source: Self/Family/Friend Insurance type: Medicare  Medical Screening Exam [facility]  Walk-in ONLY) Medical Exam completed: Yes  Crisis Care Plan Living Arrangements: Alone Legal Guardian:  (self) Name of Psychiatrist: [facility] Name of Therapist: [facility]   Education Status Is patient currently in school?: No Is the patient employed, unemployed or receiving disability?: Unemployed, Receiving disability income  Risk to self with the past 6  months Suicidal Ideation: Yes-Currently Present Has patient been a risk to self within the past 6 months prior to admission? : No Suicidal Intent: Yes-Currently Present Has patient had any suicidal intent within the past 6 months prior to admission? : No Is patient at risk for suicide?: No Suicidal Plan?: Yes-Currently Present Has patient had any suicidal plan within the past 6 months prior to admission? : No Specify Current Suicidal Plan: drown self in the lake or take pills Access to Means: No What has been your use of drugs/alcohol within the last 12 months?: None reported  Previous Attempts/Gestures: No How many times?: 0 Other Self Harm Risks: None reported  Triggers for Past Attempts: None known Intentional Self Injurious Behavior: None Comment - Self Injurious Behavior: n/a Family Suicide History: No Recent stressful life event(s): Other (Comment) (community stressors ) Persecutory voices/beliefs?: Yes Depression: No Substance abuse history and/or treatment for substance abuse?: No Suicide prevention information given to non-admitted patients: Yes  Risk to Others within the past 6 months Homicidal Ideation: No Does patient have any lifetime risk of violence toward others beyond the six months prior to admission? : No Thoughts of Harm to Others: No Current Homicidal Intent: No Current Homicidal Plan: No Access to Homicidal Means: No Identified Victim: n/a History of harm to others?: No Assessment of Violence: None Noted Violent Behavior Description: n/a Does patient have access to weapons?: No Criminal Charges Pending?: Yes Describe Pending Criminal Charges: assault Does patient have a court date: Yes Court Date:  (Pt reports to be unsure ) Is patient on probation?: Unknown  Psychosis Hallucinations: Auditory, With command Delusions: Unspecified  Mental Status Report Appearance/Hygiene: Unremarkable Eye Contact: Good Motor Activity: Freedom of movement Speech:  Logical/coherent Level of Consciousness: Alert Mood: Pleasant, Anxious Affect: Anxious Anxiety Level: Minimal Thought Processes: Coherent, Relevant Judgement: Partial Orientation: Person, Place, Time, Situation Obsessive Compulsive Thoughts/Behaviors: Minimal (I'm not crazy)  Cognitive Functioning Concentration: Good Memory: Recent Intact, Remote Intact Is patient IDD: No Insight: Fair Impulse Control: Fair Appetite: Good Have you had any weight changes? : No Change Amount of the weight change? (lbs):  (n/a) Sleep: No Change Total Hours of Sleep: 8 Vegetative Symptoms: None     Prior Inpatient Therapy Prior Inpatient Therapy: Yes Prior Therapy Dates: Multiple Prior Therapy Facilty/Provider(s): [facility], [facility] Reason for Treatment: Schizophrenia, paranoid type  Prior Outpatient Therapy Prior Outpatient Therapy: Yes Prior Therapy Dates: Ongoing Prior Therapy Facilty/Provider(s): [facility] Reason for Treatment: Med mang Does patient have an ACCT team?: Unknown Does patient have Intensive In-House Services?  : Yes Does patient have [facility] services? : Unknown Does patient have P4CC services?: Unknown  ADL Screening (condition at time of admission) Is the patient deaf or have difficulty hearing?: No Does the patient have difficulty seeing, even when wearing glasses/contacts?: No Does the patient have difficulty concentrating, remembering, or making decisions?: No Does the patient have difficulty dressing or bathing?: No Does the patient have difficulty walking or climbing stairs?: No Weakness of Legs: None Weakness of Arms/Hands: None  Home Assistive Devices/Equipment Home Assistive Devices/Equipment: None  Therapy Consults (therapy consults require a physician order) PT Evaluation Needed: No OT Evalulation  Needed: No SLP Evaluation Needed: No Abuse/Neglect Assessment (Assessment to be complete while patient is alone) Abuse/Neglect Assessment Can Be  Completed: Yes Physical Abuse: Denies Verbal Abuse: Denies Sexual Abuse: Denies Exploitation of patient/patient's resources: Denies Self-Neglect: Denies Values / Beliefs Cultural Requests During Hospitalization: None Spiritual Requests During Hospitalization: None Consults Spiritual Care Consult Needed: No Transition of Care Team Consult Needed: No            Disposition:  Disposition Initial Assessment Completed for this Encounter: Yes Patient referred to: Other (Comment)  On Site Evaluation by:   Reviewed with Physician:    Opal Sidles 05/19/2020 10:33 AM

## 2020-05-19 NOTE — Tx Team (Signed)
Initial Treatment Plan 05/19/2020 10:56 PM [name] TKP:[id number]    PATIENT STRESSORS: Marital or family conflict Medication change or noncompliance   PATIENT STRENGTHS: General fund of knowledge Motivation for treatment/growth Supportive family/friends   PATIENT IDENTIFIED PROBLEMS: psychosis  Depression  anxiety  "paranoia, anxiety, and depression"               DISCHARGE CRITERIA:  Improved stabilization in mood, thinking, and/or behavior Verbal commitment to aftercare and medication compliance  PRELIMINARY DISCHARGE PLAN: Attend PHP/IOP Attend 12-step recovery group  PATIENT/FAMILY INVOLVEMENT: This treatment plan has been presented to and reviewed with the patient, [name],.  The patient and family have been given the opportunity to ask questions and make suggestions.  Delos Haring, RN 05/19/2020, 10:56 PM

## 2020-05-19 NOTE — ED Notes (Signed)
Pt A&O, calm & cooperative. Reports passive SI, no RIS observed. Prepared for transfer to IP @ [facility]. Report called. NAD.

## 2020-05-19 NOTE — H&P (Signed)
Behavioral Health Medical Screening Exam  [name] [name] is an 44 y.o. female.who presented to [facility] voluntarily, although transported by police. Patient is well known to the behavioral health system. Her psychiatric history is significant for Paranoid schizophrenia to include fixed delusions mostly related to God and [name] Christ. Today she presented to [facility] stating," [name] [name] is trying to kill me. God is telling me that I am dead, talking about sex, and telling me that men are going to fuck me and dump me." She reports that over the weekend, she had called the police so many times that they told her that the next time she called, they would IVC her. Stated that she called her therapist numerous times yesterday telling her that she felt sick. Reported at 3:00 am, God called her again and said," No guys are going to sleep with you because you are poor and I am dead." She added that she is now having suicidal thoughts with a plan to run into a lake. Stated," [name] [name] is telling me that I am dead." She is unable to contract for safety at this time. Patient denies HI.   Patient does have a CST team and she stated that the last time she took her medications was Monday. Reported yesterday, she went for an assessment for another program with ACTT but could not provide details. I made an attempt to speak to CST team, [phone] but attempt was unsuccessful. Patient was assessed by psychiatry 05/12/2020 and it was determined that she was up to date on her injectable (Invega 234) which she had received two days prior.   Total Time spent with patient: 20 minutes  Psychiatric Specialty Exam: Physical Exam Psychiatric:        Mood and Affect: Mood normal.        Behavior: Behavior normal.     Comments: Paranoid, fixed delusions, suicidal thoughts     Review of Systems  Psychiatric/Behavioral: Positive for hallucinations and suicidal ideas.       Paranoid, fixed delusions, suicidal thoughts     There  were no vitals taken for this visit.There is no height or weight on file to calculate BMI. General Appearance: Fairly Groomed Eye Contact:  Good Speech:  Clear and Coherent and Normal Rate Volume:  Normal Mood:  Dysphoric Affect:  Restricted Thought Process:  Coherent and Descriptions of Associations: Intact Orientation:  Full (Time, Place, and Person) Thought Content:  Delusions and Hallucinations: Auditory Suicidal Thoughts:  Yes.  with intent/plan Homicidal Thoughts:  No Memory:  Immediate;   Fair Recent;   Fair Judgement:  Impaired Insight:  Shallow Psychomotor Activity:  Normal Concentration: Concentration: Fair and Attention Span: Fair Recall:  [employer] of Knowledge:Fair Language: Good Akathisia:  Negative Handed:  Right AIMS (if indicated):    Assets:  Communication Skills Resilience Sleep:     Musculoskeletal: Strength & Muscle Tone: within normal limits Gait & Station: normal Patient leans: N/A  There were no vitals taken for this visit.  Recommendations: Based on my evaluation the patient does not appear to have an emergency medical condition.    Patient denies  HI. She has a psychiatric history of  Paranoid schizophrenia and fixed delusions of [name] Christ trying to harm her which is her baseline. She however endorses SI with plan as noted above and is unable to contract for safety. As such, I am recommending overnight observation for safety and stability. Patient will be transported to the [facility] and will be reassessed by psychiatry  in the morning. I have attempted to contact patients CST team to discuss current disposition along with current medications but attempt was unsuccessful.  Report provided to Reola Calkins, PMHNP and [facility] nurse by TTS counselor. Transportation to be provided by safe transport.   Denzil Magnuson, NP 05/19/2020, 9:46 AM

## 2020-05-19 NOTE — ED Notes (Signed)
Patient was accepted onto unit. Pateint experiencing psychosis. Patient cooperative. Patient went to sleep upon acceptance to unit.

## 2020-05-19 NOTE — ED Provider Notes (Signed)
FBC/OBS ASAP Discharge Summary  Date and Time: 05/19/2020 6:48 PM  Name: [name] [name]  MRN:  [id number]   Discharge Diagnoses:  Final diagnoses:  Schizophrenia, paranoid type [facility])    Subjective: She reported hearing voices from [facility] telling her he wants her dead on arrival. She reported that [employer] won't respect her for "shit." She reported hearing [name] voice constantly and that it's getting worse and she's fed up. She reported having suicidal thoughts with no plan because she has to do something because she's tired of hearing the voices. She reported calling 911 today, on her way to a therapy session at [facility] because the voices wouldn't stop. She reported that the voices started at 3 am this morning when she woke up and stated that it's been constant since then.  She reported being compliant with taking her psychotropic medications and stated that the medication do not decrease the voices but helps to keep her calm. She reported having paranoia thoughts and described the thoughts as feeling like people are trying to do something harmful to her or poison her. She reported having these thoughts "many times throughout the week." She reported feeling sad all the time because she's tired of hearing the voices all the time. She reported having a fair appetite. She reported fair sleep and stated that she sleeps about 5 hours per night. She reported living alone in a house. She denies having access to firearms in her home. She reported receiving medication management and therapy at [facility] Monday thru Friday from 9 am to 2 pm. She reported her current home medications to include Zoloft, Risperdal, Cogentin, Trazodone, and Invega IM. She reported that she last received her Invega injection two weeks while she was at the emergency department.   Stay Summary: Admitted to [facility] for observation and restarted on medications. Continued to voice suicidal ideations and report  hallucinations and delusions of God being after her. Accepted at [facility] for admission for stabilization. See H&P for medication details.  Total Time spent with patient: 20 minutes  Past Psychiatric History: Schizophrenia, numerous ED visits, previous hospitalizations Past Medical History:  Past Medical History:  Diagnosis Date   Arthritis    Depression    Hallucinations    Paranoid schizophrenia (HCC)    Schizoaffective disorder (HCC)    No past surgical history on file. Family History: No family history on file. Family Psychiatric History: None reported Social History:  Social History   Substance and Sexual Activity  Alcohol Use Not Currently   Comment: She denies      Social History   Substance and Sexual Activity  Drug Use Not Currently   Comment: She denies     Social History   Socioeconomic History   Marital status: Single    Spouse name: Not on file   Number of children: Not on file   Years of education: Not on file   Highest education level: Not on file  Occupational History   Occupation: Disability  Tobacco Use   Smoking status: Never Smoker   Smokeless tobacco: Never Used  Building services engineer Use: Never used  Substance and Sexual Activity   Alcohol use: Not Currently    Comment: She denies    Drug use: Not Currently    Comment: She denies    Sexual activity: Not Currently  Other Topics Concern   Not on file  Social History Narrative   Pt stated that she lives in [place], and  that she lives alone.  Pt receives outpatient psychiatric resources through [facility].   Social Determinants of Health   Financial Resource Strain:    Difficulty of Paying Living Expenses:   Food Insecurity:    Worried About Programme researcher, broadcasting/film/video in the Last Year:    Barista in the Last Year:   Transportation Needs:    Freight forwarder (Medical):    Lack of Transportation (Non-Medical):   Physical Activity:    Days of Exercise per  Week:    Minutes of Exercise per Session:   Stress:    Feeling of Stress :   Social Connections:    Frequency of Communication with Friends and Family:    Frequency of Social Gatherings with Friends and Family:    Attends Religious Services:    Active Member of Clubs or Organizations:    Attends Engineer, structural:    Marital Status:    SDOH:  SDOH Screenings   Alcohol Screen: Low Risk    Last Alcohol Screening Score (AUDIT): 0  Depression (PHQ2-9): Medium Risk   PHQ-2 Score: 10  Financial Resource Strain:    Difficulty of Paying Living Expenses:   Food Insecurity:    Worried About Programme researcher, broadcasting/film/video in the Last Year:    Barista in the Last Year:   Housing:    Last Housing Risk Score:   Physical Activity:    Days of Exercise per Week:    Minutes of Exercise per Session:   Social Connections:    Frequency of Communication with Friends and Family:    Frequency of Social Gatherings with Friends and Family:    Attends Religious Services:    Active Member of Clubs or Organizations:    Attends Engineer, structural:    Marital Status:   Stress:    Feeling of Stress :   Tobacco Use: Low Risk    Smoking Tobacco Use: Never Smoker   Smokeless Tobacco Use: Never Used  Transportation Needs:    Freight forwarder (Medical):    Lack of Transportation (Non-Medical):     Has this patient used any form of tobacco in the last 30 days? (Cigarettes, Smokeless Tobacco, Cigars, and/or Pipes) A prescription for an FDA-approved tobacco cessation medication was offered at discharge and the patient refused  Current Medications:  Current Facility-Administered Medications  Medication Dose Route Frequency Provider Last Rate Last Admin   acetaminophen (TYLENOL) tablet 650 mg  650 mg Oral Q6H PRN Charonda Hefter, Gerlene Burdock, FNP       alum & mag hydroxide-simeth (MAALOX/MYLANTA) 200-200-20 MG/5ML suspension 30 mL  30 mL Oral Q4H PRN Loyola Santino, Gerlene Burdock, FNP       benztropine (COGENTIN) tablet 1 mg  1 mg Oral BID Alvin Rubano, Gerlene Burdock, FNP   1 mg at 05/19/20 1300   hydrOXYzine (ATARAX/VISTARIL) tablet 25 mg  25 mg Oral TID PRN Tayra Dawe, Gerlene Burdock, FNP       magnesium hydroxide (MILK OF MAGNESIA) suspension 30 mL  30 mL Oral Daily PRN Austine Kelsay, Gerlene Burdock, FNP       OLANZapine (ZYPREXA) tablet 15 mg  15 mg Oral QHS Oaklyn Mans, Gerlene Burdock, FNP       [START ON 05/31/2020] paliperidone (INVEGA SUSTENNA) injection 234 mg  234 mg Intramuscular Q28 days Anatalia Kronk, Feliz Beam B, FNP       risperiDONE (RISPERDAL M-TABS) disintegrating tablet 2 mg  2 mg Oral BID Sanaa Zilberman, Gerlene Burdock, FNP  2 mg at 05/19/20 1300   sertraline (ZOLOFT) tablet 50 mg  50 mg Oral Daily Duglas Heier, Gerlene Burdock, FNP   50 mg at 05/19/20 1300   traZODone (DESYREL) tablet 150 mg  150 mg Oral QHS PRN Vander Kueker, Gerlene Burdock, FNP       Current Outpatient Medications  Medication Sig Dispense Refill   benztropine (COGENTIN) 1 MG tablet Take 1 tablet (1 mg total) by mouth 2 (two) times daily. 60 tablet 3   INVEGA SUSTENNA 156 MG/ML SUSY injection Inject 156 mg into the muscle every 28 (twenty-eight) days.      OLANZapine (ZYPREXA) 15 MG tablet Take 15 mg by mouth at bedtime.     risperidone (RISPERDAL) 4 MG tablet Take 1 tablet (4 mg total) by mouth 2 (two) times daily. 60 tablet 2   sertraline (ZOLOFT) 50 MG tablet Take 1 tablet (50 mg total) by mouth daily. 30 tablet 0   traZODone (DESYREL) 150 MG tablet Take 1 tablet (150 mg total) by mouth at bedtime as needed and may repeat dose one time if needed for sleep. 90 tablet 1    PTA Medications: (Not in a hospital admission)   Musculoskeletal  Strength & Muscle Tone: within normal limits Gait & Station: normal Patient leans: N/A  Psychiatric Specialty Exam  Presentation  General Appearance: Appropriate for Environment  Eye Contact:Fair  Speech:Clear and Coherent  Speech Volume:Normal  Handedness:Right   Mood and Affect   Mood:Depressed  Affect:Congruent;Depressed   Thought Process  Thought Processes:Coherent  Descriptions of Associations:Intact  Orientation:Full (Time, Place and Person)  Thought Content:Delusions;Paranoid Ideation  Hallucinations:Hallucinations: Auditory Description of Auditory Hallucinations: "[name] is telling me he wants me dead on arrival."  Ideas of Reference:Paranoia;Delusions  Suicidal Thoughts:Suicidal Thoughts: Yes, Active SI Active Intent and/or Plan: Without Plan;With Intent  Homicidal Thoughts:Homicidal Thoughts: No   Sensorium  Memory:Immediate Fair;Recent Fair;Remote Fair  Judgment:Fair  Insight:Fair   Executive Functions  Concentration:Fair  Attention Span:Fair  Recall:Fair  Fund of Knowledge:Fair  Language:Fair   Psychomotor Activity  Psychomotor Activity:Psychomotor Activity: Normal   Assets  Assets:Desire for Improvement;Housing;Leisure Time;Physical Health;Social Support   Sleep  Sleep:Sleep: Fair Number of Hours of Sleep: 5   Physical Exam  Physical Exam Vitals and nursing note reviewed.  Constitutional:      Appearance: She is well-developed.  Cardiovascular:     Rate and Rhythm: Normal rate.  Pulmonary:     Effort: Pulmonary effort is normal.  Musculoskeletal:        General: Normal range of motion.  Skin:    General: Skin is warm.  Neurological:     Mental Status: She is alert and oriented to person, place, and time.    Review of Systems  Constitutional: Negative.   HENT: Negative.   Eyes: Negative.   Respiratory: Negative.   Cardiovascular: Negative.   Gastrointestinal: Negative.   Genitourinary: Negative.   Musculoskeletal: Negative.   Skin: Negative.   Neurological: Negative.   Endo/Heme/Allergies: Negative.   Psychiatric/Behavioral: Positive for depression, hallucinations and suicidal ideas.   Blood pressure 107/72, pulse 81, temperature 97.8 F (36.6 C), temperature source Oral, resp. rate  18, height 4\' 11"  (1.499 m), weight 121 lb (54.9 kg), SpO2 100 %. Body mass index is 24.44 kg/m.   Disposition: Discharge to [facility] for admission  [facility], FNP 05/19/2020, 6:48 PM

## 2020-05-19 NOTE — ED Notes (Signed)
Pt admitted to continuous assessment d/t increased anxiety, A/VH and paranoid thinking. Pt reports, "I see and hear God. He is harassing me, emotionally and verbally abusing me and making me hit myself in the head". Cooperative throughout assessment. Pt responding to internal stimuli but able to be redirected. Pt resting comfortable with eyes closed. Assessment complete.

## 2020-05-19 NOTE — ED Notes (Signed)
COVID testing performed without difficulty. Pt in bed talking to self. Denies SI/HI when asked. Safety maintained.

## 2020-05-20 ENCOUNTER — Encounter (HOSPITAL_COMMUNITY): Payer: Self-pay | Admitting: Psychiatry

## 2020-05-20 DIAGNOSIS — F250 Schizoaffective disorder, bipolar type: Secondary | ICD-10-CM | POA: Diagnosis not present

## 2020-05-20 DIAGNOSIS — F201 Disorganized schizophrenia: Secondary | ICD-10-CM

## 2020-05-20 MED ORDER — PALIPERIDONE PALMITATE ER 156 MG/ML IM SUSY
156.0000 mg | PREFILLED_SYRINGE | Freq: Once | INTRAMUSCULAR | Status: AC
  Administered 2020-05-20: 156 mg via INTRAMUSCULAR
  Filled 2020-05-20: qty 1

## 2020-05-20 NOTE — Progress Notes (Signed)
Recreation Therapy Notes  Patient admitted to unit 7.14.21. Due to admission within last year, no new assessment conducted at this time. Last assessment conducted 1.6.21. Patient reports someone being after her as current stressor.  Patient states reason for current admission is stress, anxiety, depression and paranoia.  Patient states coping skills continue to be isolation, writing, television, exercise, music, prayer, avoidance, reading and hot bath/shower.  Leisure interest remain television, cleaning and shopping.  Patient expressed strengths were still artistry, cleanliness and determination.  Areas of improvement continue to be social network and mobility.  Patient denies SI, HI, AVH at this time. Patient reports goal of being less paranoid and depressed.    [name], LRT/CRTS    [name], Meadow Abramo A 05/20/2020 11:40 AM

## 2020-05-20 NOTE — Progress Notes (Signed)
Pt had situation this evening when voices were talking loud to her, pt may have issues at night due to decreased noise stimulation. Writer spent 1:1 time with pt to help ease her mind about the voices.     05/20/20 2300  Psych Admission Type (Psych Patients Only)  Admission Status Voluntary  Psychosocial Assessment  Patient Complaints Suspiciousness;Self-harm thoughts  Eye Contact Fair  Facial Expression Flat  Affect Flat  Speech Logical/coherent  Interaction Cautious;Guarded  Motor Activity Slow  Appearance/Hygiene Unremarkable  Behavior Characteristics Anxious  Mood Preoccupied;Suspicious  Aggressive Behavior  Effect No apparent injury  Thought Process  Coherency Blocking  Content Religiosity  Delusions Religious;Persecutory  Perception Derealization;Hallucinations  Hallucination Auditory;Visual  Judgment Impaired  Confusion WDL  Danger to Self  Current suicidal ideation? Passive  Self-Injurious Behavior No self-injurious ideation or behavior indicators observed or expressed   Agreement Not to Harm Self Yes  Description of Agreement verbally contracts for safety  Danger to Others  Danger to Others None reported or observed

## 2020-05-20 NOTE — Progress Notes (Signed)
The patient rated her day as a 5 out of a 10. Patient states that she felt paranoid today and that she had some problems as well. Her goal for tomorrow is "safety".
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
## 2020-05-20 NOTE — Progress Notes (Signed)
pt roommate had to be moved to the quiet room do the pt responding and arguing loudly with the voices.

## 2020-05-20 NOTE — BHH Suicide Risk Assessment (Signed)
[facility] Admission Suicide Risk Assessment   Nursing information obtained from:  Patient Demographic factors:  NA Current Mental Status:  NA Loss Factors:  NA Historical Factors:  Impulsivity Risk Reduction Factors:  Positive therapeutic relationship  Total Time spent with patient: 30 minutes Principal Problem: <principal problem not specified> Diagnosis:  Active Problems:   Schizophrenia (HCC)  Subjective Data: Patient is seen and examined.  Patient is a 44 year old female with a past psychiatric history significant for schizophrenia who originally presented to the [facility] emergency department on 05/12/2020.  She presented with paranoid delusions and hyper religious delusions at that time.  She has had multiple emergency room visits in the past.  She has a longstanding history of noncompliance with her psychiatric medications.  During the evaluation by psychiatry in the emergency department she denied suicidal or homicidal ideation, and did not appear to require psychiatric hospitalization at that time.  Her CST team was contacted and they stated that she had been receiving her medications.  She admits to medication on Saturday because when they went to her home the patient refused to open the door.  She was discharged to home from the emergency department.  Her last psychiatric admission to our facility was on 03/27/2020.  She was Admitted to the observation unit, and received the paliperidone injection to 34 mg.  She was also placed on Risperdal orally.  She was discharged on 5/23.  Review of the electronic medical record revealed at least 6 emergency room visits since 04/11/2020.  The decision was made to admit her to the hospital.  She did receive her long-acting Invega injection on approximately 05/13/2020.  The patient stated her auditory hallucinations today have decreased, but she still maintains that there is still present.  She denied any suicidal ideation.  She does continue to  have some paranoid delusions.  She was admitted to the hospital for evaluation and stabilization.  Continued Clinical Symptoms:  Alcohol Use Disorder Identification Test Final Score (AUDIT): 0 The "Alcohol Use Disorders Identification Test", Guidelines for Use in Primary Care, Second Edition.  World Science writer [facility]). Score between 0-7:  no or low risk or alcohol related problems. Score between 8-15:  moderate risk of alcohol related problems. Score between 16-19:  high risk of alcohol related problems. Score 20 or above:  warrants further diagnostic evaluation for alcohol dependence and treatment.   CLINICAL FACTORS:   Schizophrenia:   Depressive state Paranoid or undifferentiated type   Musculoskeletal: Strength & Muscle Tone: within normal limits Gait & Station: normal Patient leans: N/A  Psychiatric Specialty Exam: Physical Exam Vitals and nursing note reviewed.  HENT:     Head: Normocephalic and atraumatic.  Pulmonary:     Effort: Pulmonary effort is normal.  Neurological:     General: No focal deficit present.     Mental Status: She is alert and oriented to person, place, and time.     Review of Systems  Blood pressure 94/82, pulse 84, temperature 98.3 F (36.8 C), temperature source Oral, resp. rate 18, height 4\' 10"  (1.473 m), weight 53.5 kg, last menstrual period 05/19/2020, SpO2 100 %.Body mass index is 24.66 kg/m.  General Appearance: Disheveled  Eye Contact:  Minimal  Speech:  Normal Rate  Volume:  Decreased  Mood:  Dysphoric  Affect:  Congruent  Thought Process:  Coherent and Descriptions of Associations: Circumstantial  Orientation:  Full (Time, Place, and Person)  Thought Content:  Delusions and Hallucinations: Auditory  Suicidal Thoughts:  No  Homicidal Thoughts:  No  Memory:  Immediate;   Poor Recent;   Poor Remote;   Poor  Judgement:  Impaired  Insight:  Lacking  Psychomotor Activity:  Decreased  Concentration:  Concentration: Fair and  Attention Span: Fair  Recall:  [employer] of Knowledge:  Fair  Language:  Fair  Akathisia:  Negative  Handed:  Right  AIMS (if indicated):     Assets:  Desire for Improvement Resilience  ADL's:  Intact  Cognition:  WNL  Sleep:  Number of Hours: 7      COGNITIVE FEATURES THAT CONTRIBUTE TO RISK:  None    SUICIDE RISK:   Mild:  Suicidal ideation of limited frequency, intensity, duration, and specificity.  There are no identifiable plans, no associated intent, mild dysphoria and related symptoms, good self-control (both objective and subjective assessment), few other risk factors, and identifiable protective factors, including available and accessible social support.  PLAN OF CARE: Patient is seen and examined.  Patient is a 44 year old female with the above-stated past psychiatric history who was admitted to the hospital secondary to worsening schizophrenia symptoms and failure to be able to be adequately treated in the community.  She will be admitted to the hospital.  She will be integrated in the milieu.  She will be encouraged to attend groups.  She did receive her previous Invega to 34 long-acting injection and the next is due on 05/31/2020.  Her 156 mg 7-day follow-up injection of Invega is due today, and we will get that to her.  She will also continue on Cogentin 1 mg p.o. twice daily.  She continues on Risperdal 2 mg p.o. twice daily as well as Zoloft 50 mg p.o. daily.  She will also have available trazodone 150 mg p.o. nightly as needed insomnia.  Review of her laboratories revealed essentially normal electrolytes, essentially normal lipid panel, a mildly decreased white blood cell count at 3.6 but otherwise normal CBC.  Her acetaminophen was less than 10, salicylate was less than 7.  Hemoglobin A1c was 5.0.  Pregnancy test was negative.  TSH was normal at 0.942.  Blood alcohol was less than 10.  Drug screen was negative.  Her most recent EKG from 7/3 showed a normal sinus rhythm with a  mild ST segment elevation in a normal QTc interval.  I certify that inpatient services furnished can reasonably be expected to improve the patient's condition.   Antonieta Pert, MD 05/20/2020, 10:39 AM

## 2020-05-20 NOTE — Progress Notes (Signed)
Recreation Therapy Notes  Date: 7.15.21 Time: 0920 Location: [address] Dayroom  Group Topic: Communication, Team Building, Problem Solving  Goal Area(s) Addresses:  Patient will effectively work with peer towards shared goal.  Patient will identify skill used to make activity successful.  Patient will identify how skills used during activity can be used to reach post d/c goals.   Intervention: STEM Activity   Activity: In team's, using 10 plastic cups and a rubber band with 5 strings attached, patients were to sit cups up straight and stack them into a pyramid.  Education: Pharmacist, community, Building control surveyor.   Education Outcome: Acknowledges education/In group clarification offered/Needs additional education.   Clinical Observations/Feedback:  Pt did not attend group session.   [name], LRT/CTRS         [name], Macky Galik A 05/20/2020 11:21 AM

## 2020-05-20 NOTE — Progress Notes (Signed)
Pt up pacing in her room responding to internal stimuli, pt appears to not be able to tolerate a roommate at this time

## 2020-05-20 NOTE — BHH Suicide Risk Assessment (Signed)
B[facility] INPATIENT:  Family/Significant Other Suicide Prevention Education   Suicide Prevention Education:  Patient Refusal for Family/Significant Other Suicide Prevention Education: The patient has been unable to provide written consent for family/significant other to be provided Family/Significant Other Suicide Prevention Education during admission and/or prior to discharge.  Physician notified.     SPE completed with patient, as patient has been unable to provide consent for family contact. SPE pamphlet placed on chart for patient to share with supports at discharge.     [name] [name] MSW, LCSWA Clincal Social Worker  [facility]   

## 2020-05-20 NOTE — Progress Notes (Addendum)
Pt up to the nursing station stating God talking to her calling her names, and pt speaking loudly to the voices. Pt stating the voices are constantly talking and requesting more medication/ NP-Adaku  Notified. Writer sat wit pt and talked and comforted pt to try to cal, her down enough to go to sleep. Pt visibly having argument with the voices.

## 2020-05-20 NOTE — Progress Notes (Signed)
°   05/20/20 1100  Psych Admission Type (Psych Patients Only)  Admission Status Voluntary  Psychosocial Assessment  Patient Complaints Anxiety  Eye Contact Fair  Facial Expression Flat  Affect Flat  Speech Logical/coherent  Interaction Cautious;Guarded  Motor Activity Slow  Appearance/Hygiene Unremarkable  Behavior Characteristics Appropriate to situation  Mood Anxious  Aggressive Behavior  Effect No apparent injury  Thought Process  Coherency Blocking  Content Religiosity  Delusions Religious;Persecutory  Perception Derealization;Hallucinations  Hallucination Auditory;Visual  Judgment Impaired  Confusion WDL  Danger to Self  Current suicidal ideation? Passive  Self-Injurious Behavior No self-injurious ideation or behavior indicators observed or expressed   Agreement Not to Harm Self Yes  Description of Agreement verbally contracts for safety  Danger to Others  Danger to Others None reported or observed

## 2020-05-20 NOTE — H&P (Signed)
Psychiatric Admission Assessment Adult  Patient Identification: [name] [name] MRN:  [id number] Date of Evaluation:  05/20/2020 Chief Complaint:  Schizophrenia (HCC) [F20.9] "Jesus is trying to kill me" Principal Diagnosis: <principal problem not specified> Diagnosis:  Active Problems:   Schizophrenia (HCC)  History of Present Illness: Patient is an 44 y.o. female.who presented to [facility] voluntarily stating ," [name] is trying to kill me. God is telling me that I am dead, talking about sex, and telling me that men are going to fuck me and dump me."  She reports that over the weekend, she had called the police so many times that they told her that the next time she called, they would IVC her. She states that she called her therapist numerous times yesterday telling her that she felt sick. She states around 3:00 am, God called her again and said," No guys are going to sleep with you because you are poor and I am dead." She presented to [facility]  having suicidal thoughts with a [place].   Patient does have a CST team and she stated that the last time she took her medications was Monday.Shannell at CST was contacted to obtain collateral information regarding [name] treatment. Shannell stated that the patients has been compliant with taking her medications until 1 week ago when she stopped taking all her medications due to delusions. CST reported that they have referred the patient to ACT and that they would like her to be admitted for 90 days.  We discussed that patient's current medications and dosages. The patient's last Invega injection 156 mg was administered on 05/03/20, and this was confirm with Kandis Nab D., at the [facility]. Ad Chestine Spore from his ACT also confirms that she is not homeless and they are renting a home for her. Today patient reports feeling better than yesterday. She looks very tired but was pleasant and cooperative. She denies any suicidal ideations today. She had auditory  hallucinations in the morning but denies HI or visual hallucinations. She is paranoid and delusional but not aggressive and was smiling and responding sometimes, other times had restricted affect.  Associated Signs/Symptoms: Depression Symptoms:  depressed mood, anhedonia, feelings of worthlessness/guilt, difficulty concentrating, hopelessness, impaired memory, (Hypo) Manic Symptoms:  Delusions, Hallucinations, Anxiety Symptoms:  NA Psychotic Symptoms:  Delusions, Hallucinations: Auditory PTSD Symptoms: NA Total Time spent with patient: 30 minutes  Past Psychiatric History: She has past h/o schizophrenia, paranoid type.  Is the patient at risk to self? No.  Has the patient been a risk to self in the past 6 months? Yes.    Has the patient been a risk to self within the distant past? Yes.    Is the patient a risk to others? No.  Has the patient been a risk to others in the past 6 months? No.  Has the patient been a risk to others within the distant past? No.   Prior Inpatient Therapy:   Prior Outpatient Therapy:    Alcohol Screening: Patient refused Alcohol Screening Tool: Yes 1. How often do you have a drink containing alcohol?: Never 2. How many drinks containing alcohol do you have on a typical day when you are drinking?: 1 or 2 3. How often do you have six or more drinks on one occasion?: Never AUDIT-C Score: 0 4. How often during the last year have you found that you were not able to stop drinking once you had started?: Never 5. How often during the last year  have you failed to do what was normally expected from you because of drinking?: Never 6. How often during the last year have you needed a first drink in the morning to get yourself going after a heavy drinking session?: Never 7. How often during the last year have you had a feeling of guilt of remorse after drinking?: Never 8. How often during the last year have you been unable to remember what happened the night before  because you had been drinking?: Never 9. Have you or someone else been injured as a result of your drinking?: No 10. Has a relative or friend or a doctor or another health worker been concerned about your drinking or suggested you cut down?: No Alcohol Use Disorder Identification Test Final Score (AUDIT): 0 Substance Abuse History in the last 12 months:  No. Consequences of Substance Abuse: NA Previous Psychotropic Medications: Yes  Psychological Evaluations: Yes  Past Medical History:  Past Medical History:  Diagnosis Date  . Arthritis   . Depression   . Hallucinations   . Paranoid schizophrenia (HCC)   . Schizoaffective disorder (HCC)    History reviewed. No pertinent surgical history. Family History: History reviewed. No pertinent family history. Family Psychiatric  History: Not known Tobacco Screening: Have you used any form of tobacco in the last 30 days? (Cigarettes, Smokeless Tobacco, Cigars, and/or Pipes): No Social History:  Social History   Substance and Sexual Activity  Alcohol Use Not Currently   Comment: She denies      Social History   Substance and Sexual Activity  Drug Use Not Currently   Comment: She denies     Additional Social History:                           Allergies:  No Known Allergies Lab Results:  Results for orders placed or performed during the hospital encounter of 05/19/20 (from the past 48 hour(s))  POC SARS Coronavirus 2 Ag     Status: None   Collection Time: 05/19/20 11:58 AM  Result Value Ref Range   SARS Coronavirus 2 Ag NEGATIVE NEGATIVE    Comment: (NOTE) SARS-CoV-2 antigen NOT DETECTED.   Negative results are presumptive.  Negative results do not preclude SARS-CoV-2 infection and should not be used as the sole basis for treatment or other patient management decisions, including infection  control decisions, particularly in the presence of clinical signs and  symptoms consistent with COVID-19, or in those who have  been in contact with the virus.  Negative results must be combined with clinical observations, patient history, and epidemiological information. The expected result is Negative.  Fact Sheet for Patients: https://sanders-williams.net/  Fact Sheet for Healthcare Providers: https://martinez.com/   This test is not yet approved or cleared by the [place] FDA and  has been authorized for detection and/or diagnosis of SARS-CoV-2 by FDA under an Emergency Use Authorization (EUA).  This EUA will remain in effect (meaning this test can be used) for the duration of  the C OVID-19 declaration under Section 564(b)(1) of the Act, 21 U.S.C. section 360bbb-3(b)(1), unless the authorization is terminated or revoked sooner.    Pregnancy, urine POC     Status: None   Collection Time: 05/19/20 12:01 PM  Result Value Ref Range   Preg Test, Ur NEGATIVE NEGATIVE    Comment:        THE SENSITIVITY OF THIS METHODOLOGY IS >24 mIU/mL   POCT Urine Drug Screen - (  ICup)     Status: Normal   Collection Time: 05/19/20 12:11 PM  Result Value Ref Range   POC Amphetamine UR None Detected None Detected   POC Secobarbital (BAR) None Detected None Detected   POC Buprenorphine (BUP) None Detected None Detected   POC Oxazepam (BZO) None Detected None Detected   POC Cocaine UR None Detected None Detected   POC Methamphetamine UR None Detected None Detected   POC Morphine None Detected None Detected   POC Oxycodone UR None Detected None Detected   POC Methadone UR None Detected None Detected   POC Marijuana UR None Detected None Detected  CBC with Differential/Platelet     Status: Abnormal   Collection Time: 05/19/20  1:02 PM  Result Value Ref Range   WBC 3.6 (L) 4.0 - 10.5 K/uL   RBC 4.20 3.87 - 5.11 MIL/uL   Hemoglobin 11.9 (L) 12.0 - 15.0 g/dL   HCT 20.9 36 - 46 %   MCV 88.8 80.0 - 100.0 fL   MCH 28.3 26.0 - 34.0 pg   MCHC 31.9 30.0 - 36.0 g/dL   RDW 47.0 96.2 - 83.6  %   Platelets 314 150 - 400 K/uL   nRBC 0.0 0.0 - 0.2 %   Neutrophils Relative % 54 %   Neutro Abs 2.0 1.7 - 7.7 K/uL   Lymphocytes Relative 35 %   Lymphs Abs 1.3 0.7 - 4.0 K/uL   Monocytes Relative 9 %   Monocytes Absolute 0.3 0 - 1 K/uL   Eosinophils Relative 1 %   Eosinophils Absolute 0.0 0 - 0 K/uL   Basophils Relative 1 %   Basophils Absolute 0.0 0 - 0 K/uL   Immature Granulocytes 0 %   Abs Immature Granulocytes 0.01 0.00 - 0.07 K/uL    Comment: Performed at [facility], [address], [place], [place] [address]  Comprehensive metabolic panel     Status: None   Collection Time: 05/19/20  1:02 PM  Result Value Ref Range   Sodium 139 135 - 145 mmol/L   Potassium 3.7 3.5 - 5.1 mmol/L   Chloride 102 98 - 111 mmol/L   CO2 26 22 - 32 mmol/L   Glucose, Bld 79 70 - 99 mg/dL    Comment: Glucose reference range applies only to samples taken after fasting for at least 8 hours.   BUN 10 6 - 20 mg/dL   Creatinine, Ser 6.54 0.44 - 1.00 mg/dL   Calcium 9.7 8.9 - 65.0 mg/dL   Total Protein 7.3 6.5 - 8.1 g/dL   Albumin 4.2 3.5 - 5.0 g/dL   AST 20 15 - 41 U/L   ALT 14 0 - 44 U/L   Alkaline Phosphatase 44 38 - 126 U/L   Total Bilirubin 0.8 0.3 - 1.2 mg/dL   GFR calc non Af Amer >60 >60 mL/min   GFR calc Af Amer >60 >60 mL/min   Anion gap 11 5 - 15    Comment: Performed at [facility], [address] [place], [place] [address]  Ethanol     Status: None   Collection Time: 05/19/20  1:02 PM  Result Value Ref Range   Alcohol, Ethyl (B) <10 <10 mg/dL    Comment: (NOTE) Lowest detectable limit for serum alcohol is 10 mg/dL.  For medical purposes only. Performed at [facility], [address] [place], [place] [address]   Lipid panel     Status: Abnormal   Collection  Time: 05/19/20  1:02 PM  Result Value Ref Range   Cholesterol 181 0 - 200 mg/dL   Triglycerides 83 <161<150 mg/dL   HDL 43 >09>40 mg/dL   Total CHOL/HDL Ratio 4.2 RATIO   VLDL 17 0 - 40 mg/dL   LDL  Cholesterol 604121 (H) 0 - 99 mg/dL    Comment:        Total Cholesterol/HDL:CHD Risk Coronary Heart Disease Risk Table                     Men   Women  1/2 Average Risk   3.4   3.3  Average Risk       5.0   4.4  2 X Average Risk   9.6   7.1  3 X Average Risk  23.4   11.0        Use the calculated Patient Ratio above and the CHD Risk Table to determine the patient's CHD Risk.        ATP III CLASSIFICATION (LDL):  <100     mg/dL   Optimal  [id number]  mg/dL   Near or Above                    Optimal  130-159  mg/dL   Borderline  [id number]  mg/dL   High  >295>190     mg/dL   Very High Performed at [facility], [address] [place], [place] [address]   TSH     Status: None   Collection Time: 05/19/20  1:03 PM  Result Value Ref Range   TSH 0.942 0.350 - 4.500 uIU/mL    Comment: Performed by a 3rd Generation assay with a functional sensitivity of <=0.01 uIU/mL. Performed at [facility], [address] [place], [place] [address]   Hemoglobin A1c     Status: None   Collection Time: 05/19/20  1:03 PM  Result Value Ref Range   Hgb A1c MFr Bld 5.0 4.8 - 5.6 %    Comment: (NOTE) Pre diabetes:          5.7%-6.4%  Diabetes:              >6.4%  Glycemic control for   <7.0% adults with diabetes    Mean Plasma Glucose 96.8 mg/dL    Comment: Performed at [facility], [address] [place], [place] [address]  SARS Coronavirus 2 by RT PCR (hospital order, performed in [facility] hospital lab) Nasopharyngeal Nasopharyngeal Swab     Status: None   Collection Time: 05/19/20  3:45 PM   Specimen: Nasopharyngeal Swab  Result Value Ref Range   SARS Coronavirus 2 NEGATIVE NEGATIVE    Comment: (NOTE) SARS-CoV-2 target nucleic acids are NOT DETECTED.  The SARS-CoV-2 RNA is generally detectable in upper and lower respiratory specimens during the acute phase of infection. The lowest concentration of SARS-CoV-2 viral copies this assay can detect is 250 copies / mL. A negative  result does not preclude SARS-CoV-2 infection and should not be used as the sole basis for treatment or other patient management decisions.  A negative result may occur with improper specimen collection / handling, submission of specimen other than nasopharyngeal swab, presence of viral mutation(s) within the areas targeted by this assay, and inadequate number of viral copies (<250 copies / mL). A negative result must be combined with clinical observations, patient history, and epidemiological information.  Fact Sheet for Patients:   BoilerBrush.com.cyhttps://www.fda.gov/media/136312/download  Fact Sheet for Healthcare Providers: https://pope.com/  This test is not yet approved or  cleared by the [place] FDA and has been authorized for detection and/or diagnosis of SARS-CoV-2 by FDA under an Emergency Use Authorization (EUA).  This EUA will remain in effect (meaning this test can be used) for the duration of the COVID-19 declaration under Section 564(b)(1) of the Act, 21 U.S.C. section 360bbb-3(b)(1), unless the authorization is terminated or revoked sooner.  Performed at [facility], [address] [place], [place] [address]     Blood Alcohol level:  Lab Results  Component Value Date   ETH <10 05/19/2020   ETH <10 05/11/2020    Metabolic Disorder Labs:  Lab Results  Component Value Date   HGBA1C 5.0 05/19/2020   MPG 96.8 05/19/2020   MPG 91.06 08/06/2019   Lab Results  Component Value Date   PROLACTIN 28.9 (H) 06/26/2018   Lab Results  Component Value Date   CHOL 181 05/19/2020   TRIG 83 05/19/2020   HDL 43 05/19/2020   CHOLHDL 4.2 05/19/2020   VLDL 17 05/19/2020   LDLCALC 121 (H) 05/19/2020   LDLCALC 104 (H) 09/19/2019    Current Medications: Current Facility-Administered Medications  Medication Dose Route Frequency Provider Last Rate Last Admin  . acetaminophen (TYLENOL) tablet 650 mg  650 mg Oral Q6H PRN Money, Gerlene Burdock, FNP   650 mg  at 05/20/20 0805  . alum & mag hydroxide-simeth (MAALOX/MYLANTA) 200-200-20 MG/5ML suspension 30 mL  30 mL Oral Q4H PRN Money, Feliz Beam B, FNP      . benztropine (COGENTIN) tablet 1 mg  1 mg Oral BID Money, Gerlene Burdock, FNP   1 mg at 05/20/20 0804  . magnesium hydroxide (MILK OF MAGNESIA) suspension 30 mL  30 mL Oral Daily PRN Money, Gerlene Burdock, FNP      . OLANZapine (ZYPREXA) tablet 15 mg  15 mg Oral QHS Money, Gerlene Burdock, FNP   15 mg at 05/19/20 2211  . [START ON 05/31/2020] paliperidone (INVEGA SUSTENNA) injection 234 mg  234 mg Intramuscular Q28 days Money, Gerlene Burdock, FNP      . risperiDONE (RISPERDAL) tablet 2 mg  2 mg Oral BID Money, Gerlene Burdock, FNP   2 mg at 05/20/20 0804  . sertraline (ZOLOFT) tablet 50 mg  50 mg Oral Daily Money, Gerlene Burdock, FNP   50 mg at 05/20/20 0804  . traZODone (DESYREL) tablet 150 mg  150 mg Oral QHS Money, Gerlene Burdock, FNP   150 mg at 05/19/20 2211   PTA Medications: Medications Prior to Admission  Medication Sig Dispense Refill Last Dose  . benztropine (COGENTIN) 1 MG tablet Take 1 tablet (1 mg total) by mouth 2 (two) times daily. 60 tablet 3   . INVEGA SUSTENNA 156 MG/ML SUSY injection Inject 156 mg into the muscle every 28 (twenty-eight) days.      Marland Kitchen OLANZapine (ZYPREXA) 15 MG tablet Take 15 mg by mouth at bedtime.     . risperidone (RISPERDAL) 4 MG tablet Take 1 tablet (4 mg total) by mouth 2 (two) times daily. 60 tablet 2   . sertraline (ZOLOFT) 50 MG tablet Take 1 tablet (50 mg total) by mouth daily. 30 tablet 0   . traZODone (DESYREL) 150 MG tablet Take 1 tablet (150 mg total) by mouth at bedtime as needed and may repeat dose one time if needed for sleep. 90 tablet 1     Musculoskeletal: Strength & Muscle Tone: within normal limits Gait &  Station: unsteady Patient leans: N/A  Psychiatric Specialty Exam: Physical Exam Neurological:     Mental Status: She is oriented to person, place, and time.     Review of Systems  Blood pressure 94/82, pulse 84, temperature  98.3 F (36.8 C), temperature source Oral, resp. rate 18, height  (1.473 m), weight 53.5 kg, last menstrual period 05/19/2020, SpO2 100 %.Body mass index is 24.66 kg/m.  General Appearance: Casual  Eye Contact:  Fair  Speech:  Clear and Coherent  Volume:  Decreased  Mood:  Okay  Affect:  Restricted  Thought Process:  Coherent and Descriptions of Associations: Circumstantial  Orientation:  Full (Time, Place, and Person)  Thought Content:  WDL  Suicidal Thoughts:  No  Homicidal Thoughts:  No  Memory:  Immediate;   Fair Recent;   Fair  Judgement:  Impaired  Insight:  Lacking  Psychomotor Activity:  Normal  Concentration:  Concentration: Fair  Recall:  [employer] of Knowledge:  Fair  Language:  Fair  Akathisia:  No  Handed:  Right  AIMS (if indicated):     Assets:  Desire for Improvement  ADL's:  Intact  Cognition:  WNL  Sleep:  Number of Hours: 7    Treatment Plan Summary: Daily contact with patient to assess and evaluate symptoms and progress in treatment.   Observation Level/Precautions:  Continuous Observation  Laboratory:  NA  Psychotherapy:    Medications:    Consultations:    Discharge Concerns:    Estimated LOS:  Other:     Physician Treatment Plan for Primary Diagnosis: <principal problem not specified> Long Term Goal(s): Improvement in symptoms so as ready for discharge  Short Term Goals: Compliance with prescribed medications will improve  Physician Treatment Plan for Secondary Diagnosis: Active Problems:   Schizophrenia (HCC)  Long Term Goal(s): Improvement in symptoms so as ready for discharge  Short Term Goals: Ability to disclose and discuss suicidal ideas, Ability to demonstrate self-control will improve and Ability to maintain clinical measurements within normal limits will improve  I certify that inpatient services furnished can reasonably be expected to improve the patient's condition.    Arnoldo Lenis, MD 7/15/20213:52 PM

## 2020-05-21 MED ORDER — RISPERIDONE 3 MG PO TABS
3.0000 mg | ORAL_TABLET | Freq: Two times a day (BID) | ORAL | Status: DC
  Administered 2020-05-21: 3 mg via ORAL
  Filled 2020-05-21 (×2): qty 1

## 2020-05-21 NOTE — Progress Notes (Addendum)
[facility] MD Progress Note  05/21/2020 4:49 PM [name] [name]  MRN:  [id number] Subjective:  Patient states Jesus is saying " Going to break your legs". She admits to auditory hallucinations all morning. She states her mood is same as yesterday and she explained about her last evening. She states Jesus was on her and trying to do bad things to her. She denies any suicidal ideations but wants all this to be over and somehow stop this torture going on with her. She states she slept well and ate her breakfast. Denies any HI or visual hallucinations.  Objective: Patient is seen and examined. Patientis an 44 y.o.female.who presented to [facility] voluntarily complaining of auditory hallucinations. She is alert and oriented. She seems to be responding to the internal stimuli. She is extremely intrusive on the unit today and stopped everyone asking for various things. She is not showing any self injurious behavior on the unit. Vitals are normal today . No new labs. EKG form yesterday was normal. Principal Problem: <principal problem not specified> Diagnosis: Active Problems:   Schizophrenia (HCC)  Total Time spent with patient: 25 minutes  Past Psychiatric History: See H & P  Past Medical History:  Past Medical History:  Diagnosis Date  . Arthritis   . Depression   . Hallucinations   . Paranoid schizophrenia (HCC)   . Schizoaffective disorder (HCC)    History reviewed. No pertinent surgical history. Family History: History reviewed. No pertinent family history. Family Psychiatric  History: See H & P Social History:  Social History   Substance and Sexual Activity  Alcohol Use Not Currently   Comment: She denies      Social History   Substance and Sexual Activity  Drug Use Not Currently   Comment: She denies     Social History   Socioeconomic History  . Marital status: Single    Spouse name: Not on file  . Number of children: Not on file  . Years of education: Not on file  . Highest  education level: Not on file  Occupational History  . Occupation: Disability  Tobacco Use  . Smoking status: Never Smoker  . Smokeless tobacco: Never Used  Vaping Use  . Vaping Use: Never used  Substance and Sexual Activity  . Alcohol use: Not Currently    Comment: She denies   . Drug use: Not Currently    Comment: She denies   . Sexual activity: Not Currently  Other Topics Concern  . Not on file  Social History Narrative   Pt stated that she lives in [place], and that she lives alone.  Pt receives outpatient psychiatric resources through [employer].   Social Determinants of Health   Financial Resource Strain:   . Difficulty of Paying Living Expenses:   Food Insecurity:   . Worried About Programme researcher, broadcasting/film/video in the Last Year:   . Barista in the Last Year:   Transportation Needs:   . Freight forwarder (Medical):   Marland Kitchen Lack of Transportation (Non-Medical):   Physical Activity:   . Days of Exercise per Week:   . Minutes of Exercise per Session:   Stress:   . Feeling of Stress :   Social Connections:   . Frequency of Communication with Friends and Family:   . Frequency of Social Gatherings with Friends and Family:   . Attends Religious Services:   . Active Member of Clubs or Organizations:   . Attends Banker Meetings:   .  Marital Status:    Additional Social History:                         Sleep: Fair  Appetite:  Good  Current Medications: Current Facility-Administered Medications  Medication Dose Route Frequency Provider Last Rate Last Admin  . acetaminophen (TYLENOL) tablet 650 mg  650 mg Oral Q6H PRN Money, Gerlene Burdock, FNP   650 mg at 05/21/20 1427  . alum & mag hydroxide-simeth (MAALOX/MYLANTA) 200-200-20 MG/5ML suspension 30 mL  30 mL Oral Q4H PRN Money, Feliz Beam B, FNP      . benztropine (COGENTIN) tablet 1 mg  1 mg Oral BID Money, Gerlene Burdock, FNP   1 mg at 05/21/20 0820  . magnesium hydroxide (MILK OF MAGNESIA) suspension  30 mL  30 mL Oral Daily PRN Money, Gerlene Burdock, FNP      . OLANZapine (ZYPREXA) tablet 15 mg  15 mg Oral QHS Money, Gerlene Burdock, FNP   15 mg at 05/20/20 2043  . [START ON 05/31/2020] paliperidone (INVEGA SUSTENNA) injection 234 mg  234 mg Intramuscular Q28 days Money, Gerlene Burdock, FNP      . risperiDONE (RISPERDAL) tablet 3 mg  3 mg Oral BID Kavya Haag, Geralynn Rile, MD      . sertraline (ZOLOFT) tablet 50 mg  50 mg Oral Daily Money, Travis B, FNP   50 mg at 05/21/20 0820  . traZODone (DESYREL) tablet 150 mg  150 mg Oral QHS Money, Gerlene Burdock, FNP   150 mg at 05/20/20 2043    Lab Results: No results found for this or any previous visit (from the past 48 hour(s)).  Blood Alcohol level:  Lab Results  Component Value Date   ETH <10 05/19/2020   ETH <10 05/11/2020    Metabolic Disorder Labs: Lab Results  Component Value Date   HGBA1C 5.0 05/19/2020   MPG 96.8 05/19/2020   MPG 91.06 08/06/2019   Lab Results  Component Value Date   PROLACTIN 28.9 (H) 06/26/2018   Lab Results  Component Value Date   CHOL 181 05/19/2020   TRIG 83 05/19/2020   HDL 43 05/19/2020   CHOLHDL 4.2 05/19/2020   VLDL 17 05/19/2020   LDLCALC 121 (H) 05/19/2020   LDLCALC 104 (H) 09/19/2019    Physical Findings: AIMS: Facial and Oral Movements Muscles of Facial Expression: None, normal Lips and Perioral Area: None, normal Jaw: None, normal Tongue: None, normal,Extremity Movements Upper (arms, wrists, hands, fingers): None, normal Lower (legs, knees, ankles, toes): None, normal, Trunk Movements Neck, shoulders, hips: None, normal, Overall Severity Severity of abnormal movements (highest score from questions above): None, normal Incapacitation due to abnormal movements: None, normal Patient's awareness of abnormal movements (rate only patient's report): No Awareness, Dental Status Current problems with teeth and/or dentures?: No Does patient usually wear dentures?: No  CIWA:  CIWA-Ar Total: 0 COWS:  COWS Total Score:  0  Musculoskeletal: Strength & Muscle Tone: within normal limits Gait & Station: unsteady Patient leans: N/A  Psychiatric Specialty Exam: Physical Exam  Review of Systems  Blood pressure 94/82, pulse 84, temperature 98.3 F (36.8 C), temperature source Oral, resp. rate 18, height 4\' 10"  (1.473 m), weight 53.5 kg, last menstrual period 05/19/2020, SpO2 100 %.Body mass index is 24.66 kg/m.  General Appearance: Casual  Eye Contact:  Good  Speech:  Slow  Volume:  Decreased  Mood:  Dysphoric  Affect:  Restricted  Thought Process:  Descriptions of Associations: Circumstantial  Orientation:  Full (  Time, Place, and Person)  Thought Content:  WDL  Suicidal Thoughts:  No  Homicidal Thoughts:  No  Memory:  Immediate;   Fair Recent;   Fair  Judgement:  Impaired  Insight:  Lacking  Psychomotor Activity:  Normal  Concentration:  Concentration: Fair and Attention Span: Fair  Recall:  Poor  Fund of Knowledge:  Poor  Language:  Fair  Akathisia:  No  Handed:  Right  AIMS (if indicated):     Assets:  Desire for Improvement  ADL's:  Intact  Cognition:  Impaired,  Mild  Sleep:  Number of Hours: 5   Assessment:  Patient is a 44 year old female who was admitted to the hospital secondary to worsening schizophrenia symptoms and failure to be able to be adequately treated in the community. She remains paranoid, delusional and have auditory hallucinations.   Treatment Plan Summary: Daily contact with patient to assess and evaluate symptoms and progress in treatment.   1. Increase her Risperdal 3 mg BID PO daily for psychosis and intrusions. 2. Continue Cogentin 1 mg BID for any EPS 3. Invega 156mg /ml IM on 05/31/20 4.Continue Sertraline 50 mg Po daily for depression 5. Continue Trazodone 150 mg for sleep QHS daily. 6. Encouragement to attend group therapy. 7. Disposition in progress.   06/02/20, MD 05/21/2020, 4:49 PM

## 2020-05-21 NOTE — Progress Notes (Signed)
°   05/21/20 1100  Psych Admission Type (Psych Patients Only)  Admission Status Voluntary  Psychosocial Assessment  Patient Complaints Suspiciousness  Eye Contact Fair  Facial Expression Flat  Affect Flat  Speech Logical/coherent  Interaction Cautious;Guarded  Motor Activity Slow  Appearance/Hygiene Unremarkable  Behavior Characteristics Anxious;Agitated  Mood Preoccupied  Aggressive Behavior  Effect No apparent injury  Thought Process  Coherency Blocking  Content Religiosity  Delusions Religious;Persecutory  Perception Derealization;Hallucinations  Hallucination Auditory;Visual  Judgment Impaired  Confusion WDL  Danger to Self  Current suicidal ideation? Passive  Self-Injurious Behavior No self-injurious ideation or behavior indicators observed or expressed   Agreement Not to Harm Self Yes  Description of Agreement verbally contracts for safety  Danger to Others  Danger to Others None reported or observed

## 2020-05-21 NOTE — Progress Notes (Signed)
Pt continues to respond to internal stimuli, pt continues to ask various staff to print off materials for her. Pt encouraged to talk to SW to help her print off other materials. Pt has Hx of asking staff to print off various things from different schools.     05/21/20 2000  Psych Admission Type (Psych Patients Only)  Admission Status Voluntary  Psychosocial Assessment  Patient Complaints Restlessness  Eye Contact Fair  Facial Expression Flat  Affect Flat  Speech Logical/coherent  Interaction Cautious;Guarded  Motor Activity Slow  Appearance/Hygiene Unremarkable  Behavior Characteristics Anxious  Mood Preoccupied  Aggressive Behavior  Effect No apparent injury  Thought Process  Coherency Blocking  Content Religiosity  Delusions Religious;Persecutory  Perception Derealization;Hallucinations  Hallucination Auditory;Visual  Judgment Impaired  Confusion WDL  Danger to Self  Current suicidal ideation? Passive  Self-Injurious Behavior No self-injurious ideation or behavior indicators observed or expressed   Agreement Not to Harm Self Yes  Description of Agreement verbally contracts for safety  Danger to Others  Danger to Others None reported or observed

## 2020-05-21 NOTE — Progress Notes (Signed)
Adult Psychoeducational Group Note  Date:  05/21/2020 Time:  9:32 PM  Group Topic/Focus:  Wrap-Up Group:   The focus of this group is to help patients review their daily goal of treatment and discuss progress on daily workbooks.  Participation Level:  Minimal  Participation Quality:  Appropriate  Affect:  Blunted  Cognitive:  Oriented  Insight: Limited  Engagement in Group:  Engaged  Modes of Intervention:  Education and Support  Additional Comments:  Patient attended and participated in group tonight. She reports that today she spoke with her doctor, she went for her meals and watched television in the day room  Scot Dock 05/21/2020, 9:32 PM

## 2020-05-21 NOTE — Tx Team (Signed)
Interdisciplinary Treatment and Diagnostic Plan Update  05/21/2020 Time of Session: 10:15am [name] [name] MRN: [id number]  Principal Diagnosis: <principal problem not specified>  Secondary Diagnoses: Active Problems:   Schizophrenia ([place])   Current Medications:  Current Facility-Administered Medications  Medication Dose Route Frequency Provider Last Rate Last Admin  . acetaminophen (TYLENOL) tablet 650 mg  650 mg Oral Q6H PRN Money, Lowry Ram, FNP   650 mg at 05/20/20 2044  . alum & mag hydroxide-simeth (MAALOX/MYLANTA) 200-200-20 MG/5ML suspension 30 mL  30 mL Oral Q4H PRN Money, Darnelle Maffucci B, FNP      . benztropine (COGENTIN) tablet 1 mg  1 mg Oral BID Money, Lowry Ram, FNP   1 mg at 05/21/20 0820  . magnesium hydroxide (MILK OF MAGNESIA) suspension 30 mL  30 mL Oral Daily PRN Money, Lowry Ram, FNP      . OLANZapine (ZYPREXA) tablet 15 mg  15 mg Oral QHS Money, Lowry Ram, FNP   15 mg at 05/20/20 2043  . [START ON 05/31/2020] paliperidone (INVEGA SUSTENNA) injection 234 mg  234 mg Intramuscular Q28 days Money, Lowry Ram, FNP      . risperiDONE (RISPERDAL) tablet 2 mg  2 mg Oral BID Money, Lowry Ram, FNP   2 mg at 05/21/20 0820  . sertraline (ZOLOFT) tablet 50 mg  50 mg Oral Daily Money, Lowry Ram, FNP   50 mg at 05/21/20 0820  . traZODone (DESYREL) tablet 150 mg  150 mg Oral QHS Money, Lowry Ram, FNP   150 mg at 05/20/20 2043   PTA Medications: Medications Prior to Admission  Medication Sig Dispense Refill Last Dose  . benztropine (COGENTIN) 1 MG tablet Take 1 tablet (1 mg total) by mouth 2 (two) times daily. 60 tablet 3   . INVEGA SUSTENNA 156 MG/ML SUSY injection Inject 156 mg into the muscle every 28 (twenty-eight) days.      Marland Kitchen OLANZapine (ZYPREXA) 15 MG tablet Take 15 mg by mouth at bedtime.     . risperidone (RISPERDAL) 4 MG tablet Take 1 tablet (4 mg total) by mouth 2 (two) times daily. 60 tablet 2   . sertraline (ZOLOFT) 50 MG tablet Take 1 tablet (50 mg total) by mouth daily. 30 tablet 0    . traZODone (DESYREL) 150 MG tablet Take 1 tablet (150 mg total) by mouth at bedtime as needed and may repeat dose one time if needed for sleep. 90 tablet 1     Patient Stressors: Marital or family conflict Medication change or noncompliance  Patient Strengths: Technical sales engineer for treatment/growth Supportive family/friends  Treatment Modalities: Medication Management, Group therapy, Case management,  1 to 1 session with clinician, Psychoeducation, Recreational therapy.   Physician Treatment Plan for Primary Diagnosis: <principal problem not specified> Long Term Goal(s): Improvement in symptoms so as ready for discharge Improvement in symptoms so as ready for discharge   Short Term Goals: Compliance with prescribed medications will improve Ability to disclose and discuss suicidal ideas Ability to demonstrate self-control will improve Ability to maintain clinical measurements within normal limits will improve  Medication Management: Evaluate patient's response, side effects, and tolerance of medication regimen.  Therapeutic Interventions: 1 to 1 sessions, Unit Group sessions and Medication administration.  Evaluation of Outcomes: Not Met  Physician Treatment Plan for Secondary Diagnosis: Active Problems:   Schizophrenia ([place])  Long Term Goal(s): Improvement in symptoms so as ready for discharge Improvement in symptoms so as ready for discharge   Short Term Goals: Compliance with prescribed  medications will improve Ability to disclose and discuss suicidal ideas Ability to demonstrate self-control will improve Ability to maintain clinical measurements within normal limits will improve     Medication Management: Evaluate patient's response, side effects, and tolerance of medication regimen.  Therapeutic Interventions: 1 to 1 sessions, Unit Group sessions and Medication administration.  Evaluation of Outcomes: Not Met   RN Treatment Plan for Primary  Diagnosis: <principal problem not specified> Long Term Goal(s): Knowledge of disease and therapeutic regimen to maintain health will improve  Short Term Goals: Ability to remain free from injury will improve, Ability to verbalize frustration and anger appropriately will improve, Ability to participate in decision making will improve, Ability to identify and develop effective coping behaviors will improve and Compliance with prescribed medications will improve  Medication Management: RN will administer medications as ordered by provider, will assess and evaluate patient's response and provide education to patient for prescribed medication. RN will report any adverse and/or side effects to prescribing provider.  Therapeutic Interventions: 1 on 1 counseling sessions, Psychoeducation, Medication administration, Evaluate responses to treatment, Monitor vital signs and CBGs as ordered, Perform/monitor CIWA, COWS, AIMS and Fall Risk screenings as ordered, Perform wound care treatments as ordered.  Evaluation of Outcomes: Not Met   LCSW Treatment Plan for Primary Diagnosis: <principal problem not specified> Long Term Goal(s): Safe transition to appropriate next level of care at discharge, Engage patient in therapeutic group addressing interpersonal concerns.  Short Term Goals: Engage patient in aftercare planning with referrals and resources, Increase social support, Increase emotional regulation, Identify triggers associated with mental health/substance abuse issues and Increase skills for wellness and recovery  Therapeutic Interventions: Assess for all discharge needs, 1 to 1 time with Social worker, Explore available resources and support systems, Assess for adequacy in community support network, Educate family and significant other(s) on suicide prevention, Complete Psychosocial Assessment, Interpersonal group therapy.  Evaluation of Outcomes: Not Met   Progress in Treatment: Attending groups:  No. Participating in groups: No. Taking medication as prescribed: Yes. Toleration medication: Yes. Family/Significant other contact made: No, will contact:  if consent is given. Patient understands diagnosis: Yes. Discussing patient identified problems/goals with staff: Yes. Medical problems stabilized or resolved: Yes. Denies suicidal/homicidal ideation: Yes. Issues/concerns per patient self-inventory: No.   New problem(s) identified: No, Describe:  none.  New Short Term/Long Term Goal(s): medication stabilization, elimination of SI thoughts, development of comprehensive mental wellness plan.   Patient Goals:  "To get reduce paranoia and voices"  Discharge Plan or Barriers: Patient recently admitted. CSW will continue to follow and assess for appropriate referrals and possible discharge planning.   Reason for Continuation of Hospitalization: Anxiety Delusions  Hallucinations Medication stabilization  Estimated Length of Stay: 3-5 days   Attendees: Patient: [name] [name] 05/21/2020   Physician: Myles Lipps, MD 05/21/2020  Nursing:  05/21/2020   RN Care Manager: 05/21/2020   Social Worker: Darletta Moll, [place] 05/21/2020  Recreational Therapist:  05/21/2020   Other:  05/21/2020  Other:  05/21/2020   Other: 05/21/2020      Scribe for Treatment Team: Vassie Moselle, [place] 05/21/2020 12:17 PM

## 2020-05-22 MED ORDER — RISPERIDONE 3 MG PO TABS
3.0000 mg | ORAL_TABLET | Freq: Every day | ORAL | Status: DC
  Administered 2020-05-22: 3 mg via ORAL
  Filled 2020-05-22 (×3): qty 1

## 2020-05-22 MED ORDER — OLANZAPINE 10 MG PO TABS
20.0000 mg | ORAL_TABLET | Freq: Every day | ORAL | Status: DC
  Filled 2020-05-22 (×2): qty 2

## 2020-05-22 MED ORDER — RISPERIDONE 1 MG PO TBDP
3.0000 mg | ORAL_TABLET | Freq: Every day | ORAL | Status: DC
  Administered 2020-05-22 – 2020-05-26 (×5): 3 mg via ORAL
  Filled 2020-05-22 (×8): qty 3

## 2020-05-22 MED ORDER — OLANZAPINE 10 MG PO TBDP
20.0000 mg | ORAL_TABLET | Freq: Every day | ORAL | Status: DC
  Administered 2020-05-22: 20 mg via ORAL
  Filled 2020-05-22 (×2): qty 2

## 2020-05-22 MED ORDER — OLANZAPINE 5 MG PO TBDP
5.0000 mg | ORAL_TABLET | Freq: Every day | ORAL | Status: DC
  Administered 2020-05-22 – 2020-05-23 (×2): 5 mg via ORAL
  Filled 2020-05-22 (×4): qty 1

## 2020-05-22 MED ORDER — RISPERIDONE 2 MG PO TBDP
4.0000 mg | ORAL_TABLET | Freq: Every day | ORAL | Status: DC
  Administered 2020-05-22 – 2020-05-25 (×4): 4 mg via ORAL
  Filled 2020-05-22 (×6): qty 2

## 2020-05-22 NOTE — Progress Notes (Signed)
   05/22/20 2045  Psych Admission Type (Psych Patients Only)  Admission Status Voluntary  Psychosocial Assessment  Patient Complaints Anxiety;Worrying  Eye Contact Fair  Facial Expression Flat  Affect Flat  Speech Logical/coherent  Interaction Cautious;Guarded  Motor Activity Slow  Appearance/Hygiene Unremarkable  Behavior Characteristics Cooperative;Appropriate to situation;Anxious  Aggressive Behavior  Targets  (None)  Type of Behavior Other (Comment) (None)  Effect No apparent injury  Thought Process  Coherency Blocking  Content Religiosity  Delusions Religious;Persecutory  Perception Derealization;Hallucinations  Hallucination Auditory;Visual  Judgment Impaired  Confusion WDL  Danger to Self  Current suicidal ideation? Passive  Self-Injurious Behavior No self-injurious ideation or behavior indicators observed or expressed   Agreement Not to Harm Self Yes  Description of Agreement verbally contracts for safety  Danger to Others  Danger to Others None reported or observed

## 2020-05-22 NOTE — Progress Notes (Signed)
Pt A & O to self, place and situation. Reports fair sleep with fair appetite, normal energy and good concentration level. Pt continues to respond to internal stimuli with loud conversations and intermittent crying episodes. Pt remains preoccuppied about using the computer despite multiple verbal redirections.  Treatment team made aware of pt's increased response to internal stimuli. Changes made to medication regimen per assigned psychiatrist. Emotional support and encouragement offered to pt as needed throughout this shift. Q 15 minutes checks maintained on and off unit for safety. Verbal education done on ordered medications and effects monitored. Pt denies concerns at this time. Compliant with medications as ordered with verbal prompts. Tolerates all PO intake well.

## 2020-05-22 NOTE — [facility] Group Notes (Signed)
LCSW Group Therapy Note  05/22/2020   10:00-11:00am   Type of Therapy and Topic:  Group Therapy: Anger Cues and Responses  Participation Level:  Minimal   Description of Group:   In this group, patients learned how to recognize the physical, cognitive, emotional, and behavioral responses they have to anger-provoking situations.  They identified a recent time they became angry and how they reacted.  They analyzed how their reaction was possibly beneficial and how it was possibly unhelpful.  The group discussed a variety of healthier coping skills that could help with such a situation in the future.  Focus was placed on how helpful it is to recognize the underlying emotions to our anger, because working on those can lead to a more permanent solution as well as our ability to focus on the important rather than the urgent.  Therapeutic Goals: 1. Patients will remember their last incident of anger and how they felt emotionally and physically, what their thoughts were at the time, and how they behaved. 2. Patients will identify how their behavior at that time worked for them, as well as how it worked against them. 3. Patients will explore possible new behaviors to use in future anger situations. 4. Patients will learn that anger itself is normal and cannot be eliminated, and that healthier reactions can assist with resolving conflict rather than worsening situations.  Summary of Patient Progress:  The patient  Attended but mostly appeared to to be responding to internal stimuli thoughout the group.  Therapeutic Modalities:   Cognitive Behavioral Therapy  Evorn Gong

## 2020-05-22 NOTE — Progress Notes (Signed)
[facility] MD Progress Note  05/22/2020 10:31 AM [name] [name]  MRN:  [id number] Subjective: Patient is a 44 year old female with a past psychiatric history significant for schizophrenia who originally presented to [facility] emergency department on 05/12/2020.  She presented with paranoid delusions and hyper religious delusions.  She has had multiple emergency room visits in the last month, and the decision was made to admit her to the hospital for evaluation and stabilization.  Objective: Patient is seen and examined.  Patient is a 44 year old female with the above-stated past psychiatric history who is seen in follow-up.  She was significantly agitated yesterday.  She was quite intrusive.  She asked on multiple occasions to be able to see the Child psychotherapist.  She stated she needed the iPad to send information to a college locally.  He came to my office at least on 3 occasions asking the same question.  She had periods of significant agitation that were bizarre, but were at least quiet and she was somewhat redirectable.  This morning her agitation increased significantly to the point that she became quite loud and agitated.  We increased her Risperdal yesterday to 3 mg p.o. twice daily.  The plan today was to increase her Risperdal to 3 mg p.o. daily and 4 mg p.o. nightly.  There was discussion with the team yesterday of potentially augmenting a second antipsychotic agent.  Pharmacy recommended a first generation antipsychotic to mix with the Risperdal/paliperidone.  On examination today she is still very tangential, hyper religious, and hyper focused on something to do with the local college.  Nursing told me last evening that they did allow her to get on the computer, she sent a bunch of her personal information to the college.  I told her today that the nurses had informed me of this, but she stated they did not allow her to use the computer.  She again asked to see social work to be able to get the  iPad to send this information.  She also stated that she was having more auditory hallucinations.  We discussed the potential of a second agent to add, and I recommended Haldol or Prolixin.  She stated that she had had Haldol in the past and did not like it, and preferred if we were going to add a second agent that it be Zyprexa.  Her vital signs are stable, she is afebrile.  She slept 5.75 hours last night.  No new laboratories.  Principal Problem: <principal problem not specified> Diagnosis: Active Problems:   Schizophrenia (HCC)  Total Time spent with patient: 20 minutes  Past Psychiatric History: See admission H&P  Past Medical History:  Past Medical History:  Diagnosis Date  . Arthritis   . Depression   . Hallucinations   . Paranoid schizophrenia (HCC)   . Schizoaffective disorder (HCC)    History reviewed. No pertinent surgical history. Family History: History reviewed. No pertinent family history. Family Psychiatric  History: See admission H&P Social History:  Social History   Substance and Sexual Activity  Alcohol Use Not Currently   Comment: She denies      Social History   Substance and Sexual Activity  Drug Use Not Currently   Comment: She denies     Social History   Socioeconomic History  . Marital status: Single    Spouse name: Not on file  . Number of children: Not on file  . Years of education: Not on file  . Highest education level: Not on  file  Occupational History  . Occupation: Disability  Tobacco Use  . Smoking status: Never Smoker  . Smokeless tobacco: Never Used  Vaping Use  . Vaping Use: Never used  Substance and Sexual Activity  . Alcohol use: Not Currently    Comment: She denies   . Drug use: Not Currently    Comment: She denies   . Sexual activity: Not Currently  Other Topics Concern  . Not on file  Social History Narrative   Pt stated that she lives in [place], and that she lives alone.  Pt receives outpatient psychiatric  resources through [employer].   Social Determinants of Health   Financial Resource Strain:   . Difficulty of Paying Living Expenses:   Food Insecurity:   . Worried About Programme researcher, broadcasting/film/video in the Last Year:   . Barista in the Last Year:   Transportation Needs:   . Freight forwarder (Medical):   Marland Kitchen Lack of Transportation (Non-Medical):   Physical Activity:   . Days of Exercise per Week:   . Minutes of Exercise per Session:   Stress:   . Feeling of Stress :   Social Connections:   . Frequency of Communication with Friends and Family:   . Frequency of Social Gatherings with Friends and Family:   . Attends Religious Services:   . Active Member of Clubs or Organizations:   . Attends Banker Meetings:   Marland Kitchen Marital Status:    Additional Social History:                         Sleep: Fair  Appetite:  Fair  Current Medications: Current Facility-Administered Medications  Medication Dose Route Frequency Provider Last Rate Last Admin  . acetaminophen (TYLENOL) tablet 650 mg  650 mg Oral Q6H PRN Money, Gerlene Burdock, FNP   650 mg at 05/22/20 0815  . alum & mag hydroxide-simeth (MAALOX/MYLANTA) 200-200-20 MG/5ML suspension 30 mL  30 mL Oral Q4H PRN Money, Feliz Beam B, FNP      . benztropine (COGENTIN) tablet 1 mg  1 mg Oral BID Money, Gerlene Burdock, FNP   1 mg at 05/22/20 0813  . magnesium hydroxide (MILK OF MAGNESIA) suspension 30 mL  30 mL Oral Daily PRN Money, Gerlene Burdock, FNP      . OLANZapine (ZYPREXA) tablet 15 mg  15 mg Oral QHS Money, Gerlene Burdock, FNP   15 mg at 05/21/20 2129  . [START ON 05/31/2020] paliperidone (INVEGA SUSTENNA) injection 234 mg  234 mg Intramuscular Q28 days Money, Gerlene Burdock, FNP      . risperiDONE (RISPERDAL M-TABS) disintegrating tablet 4 mg  4 mg Oral QHS Antonieta Pert, MD      . risperiDONE (RISPERDAL) tablet 3 mg  3 mg Oral Daily Antonieta Pert, MD   3 mg at 05/22/20 0813  . sertraline (ZOLOFT) tablet 50 mg  50 mg Oral Daily  Money, Gerlene Burdock, FNP   50 mg at 05/22/20 0813  . traZODone (DESYREL) tablet 150 mg  150 mg Oral QHS Money, Gerlene Burdock, FNP   150 mg at 05/21/20 2130    Lab Results: No results found for this or any previous visit (from the past 48 hour(s)).  Blood Alcohol level:  Lab Results  Component Value Date   [facility] <10 05/19/2020   ETH <10 05/11/2020    Metabolic Disorder Labs: Lab Results  Component Value Date   HGBA1C 5.0 05/19/2020  MPG 96.8 05/19/2020   MPG 91.06 08/06/2019   Lab Results  Component Value Date   PROLACTIN 28.9 (H) 06/26/2018   Lab Results  Component Value Date   CHOL 181 05/19/2020   TRIG 83 05/19/2020   HDL 43 05/19/2020   CHOLHDL 4.2 05/19/2020   VLDL 17 05/19/2020   LDLCALC 121 (H) 05/19/2020   LDLCALC 104 (H) 09/19/2019    Physical Findings: AIMS: Facial and Oral Movements Muscles of Facial Expression: None, normal Lips and Perioral Area: None, normal Jaw: None, normal Tongue: None, normal,Extremity Movements Upper (arms, wrists, hands, fingers): None, normal Lower (legs, knees, ankles, toes): None, normal, Trunk Movements Neck, shoulders, hips: None, normal, Overall Severity Severity of abnormal movements (highest score from questions above): None, normal Incapacitation due to abnormal movements: None, normal Patient's awareness of abnormal movements (rate only patient's report): No Awareness, Dental Status Current problems with teeth and/or dentures?: No Does patient usually wear dentures?: No  CIWA:  CIWA-Ar Total: 0 COWS:  COWS Total Score: 0  Musculoskeletal: Strength & Muscle Tone: within normal limits Gait & Station: normal Patient leans: N/A  Psychiatric Specialty Exam: Physical Exam Vitals and nursing note reviewed.  HENT:     Head: Normocephalic and atraumatic.  Pulmonary:     Effort: Pulmonary effort is normal.  Neurological:     General: No focal deficit present.     Mental Status: She is alert and oriented to person, place, and  time.     Review of Systems  Blood pressure 94/62, pulse 98, temperature 97.8 F (36.6 C), temperature source Oral, resp. rate 18, height 4\' 10"  (1.473 m), weight 53.5 kg, last menstrual period 05/19/2020, SpO2 100 %.Body mass index is 24.66 kg/m.  General Appearance: Casual  Eye Contact:  Fair  Speech:  Pressured  Volume:  Decreased  Mood:  Anxious, Dysphoric and Irritable  Affect:  Flat  Thought Process:  Disorganized and Descriptions of Associations: Loose  Orientation:  Full (Time, Place, and Person)  Thought Content:  Delusions, Hallucinations: Auditory, Paranoid Ideation and Rumination  Suicidal Thoughts:  No  Homicidal Thoughts:  No  Memory:  Immediate;   Fair Recent;   Fair Remote;   Fair  Judgement:  Impaired  Insight:  Lacking  Psychomotor Activity:  Increased  Concentration:  Concentration: Fair and Attention Span: Fair  Recall:  05/21/2020 of Knowledge:  Fair  Language:  Fair  Akathisia:  Negative  Handed:  Right  AIMS (if indicated):     Assets:  Desire for Improvement Resilience  ADL's:  Intact  Cognition:  WNL  Sleep:  Number of Hours: 5.75     Treatment Plan Summary: Daily contact with patient to assess and evaluate symptoms and progress in treatment, Medication management and Plan : Patient is seen and examined.  Patient is a 44 year old female with the above-stated past psychiatric history who is seen in follow-up.   Diagnosis: 1.  Schizophrenia  Pertinent findings on examination today: 1.  Increased auditory hallucinations. 2.  Increase intrusiveness, paranoid delusional thinking, psychomotor agitation. 3.  Sleep is improved but still not great.  Plan: 1.  Continue Cogentin 1 mg p.o. twice daily for side effects of medication. 2.  Change Zyprexa to 5 mg p.o. daily 20 mg p.o. nightly.  This is for psychosis. 3.  Continue Risperdal but 3 mg p.o. daily and 4 mg p.o. nightly for psychosis. 4.  Continue Zoloft 50 mg p.o. daily for depression and  anxiety. 5.  Continue trazodone 150  mg p.o. nightly for insomnia. 6.  Patient received the long-acting paliperidone injection 234 mg on 6/26.  The patient received the additional 156 mg dose on 05/20/2020.  This is for psychosis. 7.  Disposition planning-in progress.  Antonieta PertGreg Lawson Angelie Kram, MD 05/22/2020, 10:31 AM

## 2020-05-23 MED ORDER — LORAZEPAM 1 MG PO TABS
1.0000 mg | ORAL_TABLET | ORAL | Status: AC | PRN
  Administered 2020-05-24: 1 mg via ORAL
  Filled 2020-05-23: qty 1

## 2020-05-23 MED ORDER — ZIPRASIDONE MESYLATE 20 MG IM SOLR: 20.0000 mg | INTRAMUSCULAR | Status: DC | PRN

## 2020-05-23 MED ORDER — ZIPRASIDONE MESYLATE 20 MG IM SOLR
INTRAMUSCULAR | Status: AC
  Administered 2020-05-23: 20 mg via INTRAMUSCULAR
  Filled 2020-05-23: qty 20

## 2020-05-23 MED ORDER — CHLORPROMAZINE HCL 50 MG PO TABS
100.0000 mg | ORAL_TABLET | Freq: Three times a day (TID) | ORAL | Status: DC
  Administered 2020-05-23 – 2020-05-27 (×12): 100 mg via ORAL
  Filled 2020-05-23: qty 42
  Filled 2020-05-23 (×2): qty 1
  Filled 2020-05-23: qty 4
  Filled 2020-05-23 (×2): qty 1
  Filled 2020-05-23: qty 4
  Filled 2020-05-23 (×2): qty 1
  Filled 2020-05-23: qty 42
  Filled 2020-05-23 (×3): qty 1
  Filled 2020-05-23: qty 42
  Filled 2020-05-23 (×5): qty 1

## 2020-05-23 MED ORDER — RISPERIDONE 2 MG PO TBDP
2.0000 mg | ORAL_TABLET | Freq: Three times a day (TID) | ORAL | Status: DC | PRN
  Administered 2020-05-23 – 2020-05-24 (×2): 2 mg via ORAL
  Filled 2020-05-23 (×2): qty 2

## 2020-05-23 MED ORDER — OLANZAPINE 5 MG PO TBDP
25.0000 mg | ORAL_TABLET | Freq: Every day | ORAL | Status: DC
  Filled 2020-05-23 (×2): qty 5

## 2020-05-23 MED ORDER — CHLORPROMAZINE HCL 50 MG PO TABS
50.0000 mg | ORAL_TABLET | Freq: Three times a day (TID) | ORAL | Status: DC
  Administered 2020-05-23: 50 mg via ORAL
  Filled 2020-05-23: qty 1
  Filled 2020-05-23: qty 2
  Filled 2020-05-23 (×2): qty 1

## 2020-05-23 MED ORDER — ZIPRASIDONE MESYLATE 20 MG IM SOLR: 20.0000 mg | Freq: Once | INTRAMUSCULAR | Status: AC

## 2020-05-23 NOTE — Progress Notes (Signed)
Patient has been observed sitting up in the dayroom watching tv,  falling off to sleep off and on. Writer spoke with he 1:1 and she reports having had a good day. She was able to get some things taken care of on day shift concerning her school. She was compliant with her medications and went to her room to rest. Safety maintained on unit with 15 min checks.

## 2020-05-23 NOTE — Progress Notes (Signed)
[facility] MD Progress Note  05/23/2020 3:07 PM [name] [name]  MRN:  [id number] Subjective:   Patient states Jesus is saying " Pick up those boys in [place] and kill them". She admits to auditory hallucinations all of morning. She states " I am okay, I need to use computer/internet to do online classes for her test coming Tuesday" " I need to go home tomorrow to take my test". She states she need help and somebody stop these Jesus voices in her mind. She denies any suicidal ideations. She states she slept well and ate her breakfast. Denies any HI or visual hallucinations.  Objective: Patient is seen and examined. Patientis an 44 y.o.female.who [facility] voluntarilycomplaining of auditory hallucinations. She is alert and oriented. She seems to be responding to the internal stimuli. She has fixed delusions. She is extremely intrusive on the unit.She is not showing any self injurious behavior on the unit. All vitals are normal today except blood pressure is slightly high 132/ 81 . No new labs.  Principal Problem: <principal problem not specified> Diagnosis: Active Problems:   Schizophrenia (HCC)  Total Time spent with patient: 20 minutes  Past Psychiatric History: See H & P   Past Medical History:  Past Medical History:  Diagnosis Date  . Arthritis   . Depression   . Hallucinations   . Paranoid schizophrenia (HCC)   . Schizoaffective disorder (HCC)    History reviewed. No pertinent surgical history. Family History: History reviewed. No pertinent family history. Family Psychiatric  History: See H & P Social History:  Social History   Substance and Sexual Activity  Alcohol Use Not Currently   Comment: She denies      Social History   Substance and Sexual Activity  Drug Use Not Currently   Comment: She denies     Social History   Socioeconomic History  . Marital status: Single    Spouse name: Not on file  . Number of children: Not on file  . Years of education: Not  on file  . Highest education level: Not on file  Occupational History  . Occupation: Disability  Tobacco Use  . Smoking status: Never Smoker  . Smokeless tobacco: Never Used  Vaping Use  . Vaping Use: Never used  Substance and Sexual Activity  . Alcohol use: Not Currently    Comment: She denies   . Drug use: Not Currently    Comment: She denies   . Sexual activity: Not Currently  Other Topics Concern  . Not on file  Social History Narrative   Pt stated that she lives in [place], and that she lives alone.  Pt receives outpatient psychiatric resources through [employer].   Social Determinants of Health   Financial Resource Strain:   . Difficulty of Paying Living Expenses:   Food Insecurity:   . Worried About Programme researcher, broadcasting/film/video in the Last Year:   . Barista in the Last Year:   Transportation Needs:   . Freight forwarder (Medical):   Marland Kitchen Lack of Transportation (Non-Medical):   Physical Activity:   . Days of Exercise per Week:   . Minutes of Exercise per Session:   Stress:   . Feeling of Stress :   Social Connections:   . Frequency of Communication with Friends and Family:   . Frequency of Social Gatherings with Friends and Family:   . Attends Religious Services:   . Active Member of Clubs or Organizations:   .  Attends Banker Meetings:   Marland Kitchen Marital Status:    Additional Social History:                         Sleep: Good  Appetite:  Good  Current Medications: Current Facility-Administered Medications  Medication Dose Route Frequency Provider Last Rate Last Admin  . acetaminophen (TYLENOL) tablet 650 mg  650 mg Oral Q6H PRN Money, Gerlene Burdock, FNP   650 mg at 05/22/20 0815  . alum & mag hydroxide-simeth (MAALOX/MYLANTA) 200-200-20 MG/5ML suspension 30 mL  30 mL Oral Q4H PRN Money, Feliz Beam B, FNP      . benztropine (COGENTIN) tablet 1 mg  1 mg Oral BID Money, Gerlene Burdock, FNP   1 mg at 05/23/20 0920  . chlorproMAZINE (THORAZINE)  tablet 50 mg  50 mg Oral TID Antonieta Pert, MD   50 mg at 05/23/20 1302  . magnesium hydroxide (MILK OF MAGNESIA) suspension 30 mL  30 mL Oral Daily PRN Money, Gerlene Burdock, FNP      . [START ON 05/31/2020] paliperidone (INVEGA SUSTENNA) injection 234 mg  234 mg Intramuscular Q28 days Money, Gerlene Burdock, FNP      . risperiDONE (RISPERDAL M-TABS) disintegrating tablet 3 mg  3 mg Oral Daily Antonieta Pert, MD   3 mg at 05/23/20 0920  . risperiDONE (RISPERDAL M-TABS) disintegrating tablet 4 mg  4 mg Oral QHS Antonieta Pert, MD   4 mg at 05/22/20 2044  . sertraline (ZOLOFT) tablet 50 mg  50 mg Oral Daily Money, Gerlene Burdock, FNP   50 mg at 05/23/20 6301  . traZODone (DESYREL) tablet 150 mg  150 mg Oral QHS Money, Gerlene Burdock, FNP   150 mg at 05/22/20 2045    Lab Results: No results found for this or any previous visit (from the past 48 hour(s)).  Blood Alcohol level:  Lab Results  Component Value Date   ETH <10 05/19/2020   ETH <10 05/11/2020    Metabolic Disorder Labs: Lab Results  Component Value Date   HGBA1C 5.0 05/19/2020   MPG 96.8 05/19/2020   MPG 91.06 08/06/2019   Lab Results  Component Value Date   PROLACTIN 28.9 (H) 06/26/2018   Lab Results  Component Value Date   CHOL 181 05/19/2020   TRIG 83 05/19/2020   HDL 43 05/19/2020   CHOLHDL 4.2 05/19/2020   VLDL 17 05/19/2020   LDLCALC 121 (H) 05/19/2020   LDLCALC 104 (H) 09/19/2019    Physical Findings: AIMS: Facial and Oral Movements Muscles of Facial Expression: None, normal Lips and Perioral Area: None, normal Jaw: None, normal Tongue: None, normal,Extremity Movements Upper (arms, wrists, hands, fingers): None, normal Lower (legs, knees, ankles, toes): None, normal, Trunk Movements Neck, shoulders, hips: None, normal, Overall Severity Severity of abnormal movements (highest score from questions above): None, normal Incapacitation due to abnormal movements: None, normal Patient's awareness of abnormal movements  (rate only patient's report): No Awareness, Dental Status Current problems with teeth and/or dentures?: No Does patient usually wear dentures?: No  CIWA:  CIWA-Ar Total: 0 COWS:  COWS Total Score: 0  Musculoskeletal: Strength & Muscle Tone: abnormal Gait & Station: unsteady Patient leans: N/A  Psychiatric Specialty Exam: Physical Exam Neurological:     Mental Status: She is alert and oriented to person, place, and time.     Review of Systems  Blood pressure 132/81, pulse 92, temperature 97.7 F (36.5 C), temperature source Oral, resp. rate 18,  height 4\' 10"  (1.473 m), weight 53.5 kg, last menstrual period 05/19/2020, SpO2 100 %.Body mass index is 24.66 kg/m.  General Appearance: Casual  Eye Contact:  Fair  Speech:  Slow  Volume:  Decreased  Mood:  Anxious  Affect:  Restricted  Thought Process:  Disorganized  Orientation:  Full (Time, Place, and Person)  Thought Content:  WDL  Suicidal Thoughts:  Yes.  without intent/plan  Homicidal Thoughts:  No  Memory:  Immediate;   Poor Recent;   Poor  Judgement:  Impaired  Insight:  Lacking  Psychomotor Activity:  Normal  Concentration:  Concentration: Fair  Recall:  05/21/2020 of Knowledge:  Fair  Language:  Fair  Akathisia:  No  Handed:  Right  AIMS (if indicated):     Assets:  Housing Resilience  ADL's:  Intact  Cognition:  WNL  Sleep:  Number of Hours: 5.25   Assessment: Patient is a 44 year old female who was admitted to the hospital secondary to worsening schizophrenia symptoms and failure to be able to be adequately treated in the community. She remains paranoid, delusional and have auditory hallucinations.  Treatment Plan Summary: Daily contact with patient to assess and evaluate symptoms and progress in treatment.  1. Continue Risperdal 3 mg BID PO daily for psychosis and intrusions. 2. Continue Risperdal 4 mg PO daily QHS. 2. Continue Cogentin 1 mg BID for any EPS 3. Invega 156mg /ml IM on 05/31/20 4.Continue  Sertraline 50 mg Po daily for depression 5. Continue Trazodone 150 mg for sleep QHS daily. 6. Ziprasidone 20 mg IM PRN for agitation. 7.Continue Thorazine 50 mg PO TID 8. Encouragement to attend group therapy. 9. Disposition in progress.  , MD 05/23/2020, 3:07 PM

## 2020-05-23 NOTE — Progress Notes (Signed)
   05/23/20 2100  COVID-19 Daily Checkoff  Have you had a fever (temp > 37.80C/100F)  in the past 24 hours?  No  If you have had runny nose, nasal congestion, sneezing in the past 24 hours, has it worsened? No  COVID-19 EXPOSURE  Have you traveled outside the state in the past 14 days? No  Have you been in contact with someone with a confirmed diagnosis of COVID-19 or PUI in the past 14 days without wearing appropriate PPE? No  Have you been living in the same home as a person with confirmed diagnosis of COVID-19 or a PUI (household contact)? No  Have you been diagnosed with COVID-19? No

## 2020-05-23 NOTE — Plan of Care (Signed)
Progress note  D: pt found in bed; compliant with medication administration. Pt states they slept well. Pt continues to be bizarre, watchful, and paranoid. Pt denies avh but still seems to be responding to internal stimuli. Pt also needs constant redirection and reorientation regarding going to their locker, using the iPad's, and university related transactions. Pt educated on FaceTime procedure and that the iPad's are not for internet/email use. Pt denies the want to use the iPad to communicate with support. Pt has been seen in the milieu with little to no interaction. Pt denies si/hi/ah/vh and verbally agrees to approach staff if these become apparent or before harming themself/others while at [facility].  A: Pt provided support and encouragement. Pt given medication per protocol and standing orders. Q44m safety checks implemented and continued.  R: Pt safe on the unit. Will continue to monitor.  Pt progressing in the following metrics  Problem: Education: Goal: Knowledge of [facility] Education information/materials will improve Outcome: Progressing Goal: Emotional status will improve Outcome: Progressing Goal: Verbalization of understanding the information provided will improve Outcome: Progressing   Problem: Activity: Goal: Sleeping patterns will improve Outcome: Progressing

## 2020-05-23 NOTE — [facility] Group Notes (Addendum)
[facility] LCSW Group Therapy Note  Date/Time:  05/23/2020  11:00AM-12:00PM  Type of Therapy and Topic:  Group Therapy:  Music and Mood  Participation Level:  Minimal   Description of Group: In this process group, members listened to a variety of genres of music and identified that different types of music evoke different responses.  Patients were encouraged to identify music that was soothing for them and music that was energizing for them.  Patients discussed how this knowledge can help with wellness and recovery in various ways including managing depression and anxiety as well as encouraging healthy sleep habits.    Therapeutic Goals: 1. Patients will explore the impact of different varieties of music on mood 2. Patients will verbalize the thoughts they have when listening to different types of music 3. Patients will identify music that is soothing to them as well as music that is energizing to them 4. Patients will discuss how to use this knowledge to assist in maintaining wellness and recovery 5. Patients will explore the use of music as a coping skill  Summary of Patient Progress:   Patient was attentive in music group and participated appropriately.   Therapeutic Modalities: Solution Focused Brief Therapy Activity   Henrene Dodge, LCSW

## 2020-05-24 DIAGNOSIS — F209 Schizophrenia, unspecified: Secondary | ICD-10-CM

## 2020-05-24 NOTE — Progress Notes (Signed)
   05/24/20 1500  Psych Admission Type (Psych Patients Only)  Admission Status Voluntary  Psychosocial Assessment  Patient Complaints Anxiety  Eye Contact Brief  Facial Expression Angry  Affect Anxious  Speech Aphasic  Interaction Assertive  Motor Activity Pacing  Appearance/Hygiene Unremarkable  Behavior Characteristics Cooperative  Mood Sad  Thought Process  Coherency WDL  Content WDL  Delusions Paranoid  Perception WDL  Hallucination Auditory  Judgment Impaired  Danger to Self  Current suicidal ideation? Active  Self-Injurious Behavior No self-injurious ideation or behavior indicators observed or expressed   Agreement Not to Harm Self Yes  Pt has been very hyper religious this shift, singing loud with different tone of voice. Pt compliant with medications. Will continue to monitor.

## 2020-05-24 NOTE — Progress Notes (Signed)
Recreation Therapy Notes  Date: 7.19.21 Time: 1010 Location: 500 Hall Dayroom  Group Topic: Coping Skills  Goal Area(s) Addresses:  Patient will identify healthy and unhealthy coping strategies. Patient will identify consequences of using unhealthy coping strategies. Patient will identify benefit of using healthy coping strategies.  Behavioral Response: Engaged  Intervention: Worksheet, pencils  Activity: Unhealthy vs. Healthy Coping Strategies.  Patients were to identify a problem they are currently dealing with.  Patients then identified unhealthy coping strategies they have used and the consequences of it.  Patients also identified healthy coping strategies and the expected outcomes of using healthy coping strategies.  Education: Pharmacologist, Building control surveyor.   Education Outcome: Acknowledges understanding/In group clarification offered/Needs additional education.   Clinical Observations/Feedback: Pt identified someone being after her and depression as problems she was dealing with.  Unhealthy coping strategies used were drug/alcohol use and aggression.  Pt identified consequences of these unhealthy coping strategies were addiction, causing harm and anxiety.  Healthy coping strategies identified by patient were talking about problems, seeking professional help and problem solving.  Expected outcomes for healthy coping strategies are venting, get expert advise and knowing what to do.  Barriers to using healthy coping strategies are my not be good at talking to others, professional help may not be as helpful and problem solving techniques have to be planned out.    [name], LRT/CTRS    [name], Royden Bulman A 05/24/2020 11:50 AM

## 2020-05-24 NOTE — Progress Notes (Signed)
Pt continues to respond to internal stimuli, pt has been visible some in the dayroom    05/24/20 2100  Psych Admission Type (Psych Patients Only)  Admission Status Voluntary  Psychosocial Assessment  Patient Complaints Anxiety  Eye Contact Brief  Facial Expression Angry  Affect Anxious  Speech Aphasic  Interaction Assertive  Motor Activity Pacing  Appearance/Hygiene Unremarkable  Behavior Characteristics Cooperative  Mood Preoccupied  Thought Process  Coherency WDL  Content WDL  Delusions Paranoid  Perception WDL  Hallucination Auditory  Judgment Impaired  Confusion WDL  Danger to Self  Current suicidal ideation? Active  Self-Injurious Behavior No self-injurious ideation or behavior indicators observed or expressed   Agreement Not to Harm Self Yes

## 2020-05-24 NOTE — Progress Notes (Addendum)
[facility] MD Progress Note  05/24/2020 12:22 PM [name] [name]  MRN:  [id number] Subjective: Patient states Jesus is saying " Sisters like me now". She admits to auditory hallucinations all of morning. She states she is anxious about Jesus is going to be mean again to her but today he is good. She states " I am doingokay, I need to use computer/internet to do online classes for her test coming tomorrow and to print out paperwork for her new home lease". She denies any suicidal ideations. She states she slept well and ate her breakfast this morning. She states she is paranoid that's why she does not share her business with anyone else than her doctor. Denies any HI or visual hallucinations.  Objective: Patient is seen and examined. Patient is an 44 y.o. female.who presented to [facility] voluntarily complaining of auditory hallucinations. She is alert, oriented and copperative. She seems to be responding to the internal stimuli. She has fixed delusions about Jesus. She is extremely intrusive on the unit today as well.She is not showing any self injurious behavior on the unit. Her blood pressure 105/69, pulse  122, temperature 97.6 F (36.4 C). No new labs today.   ACTT- We followed up with her ACT team about her possible discharge today but they have discharged her from their 2 programs because of non compliance. They would like her to be placed in long term facility as she is noncompliant to her medications and keeps on calling 911. Social worker is going to follow up further.    Principal Problem: <principal problem not specified> Diagnosis: Active Problems:   Schizophrenia (HCC)  Total Time spent with patient:  25 minutes  Past Psychiatric History: See H & P  Past Medical History:  Past Medical History:  Diagnosis Date   Arthritis    Depression    Hallucinations    Paranoid schizophrenia (HCC)    Schizoaffective disorder (HCC)    History reviewed. No pertinent surgical history. Family History:  History reviewed. No pertinent family history. Family Psychiatric  History: Not known Social History:  Social History   Substance and Sexual Activity  Alcohol Use Not Currently   Comment: She denies      Social History   Substance and Sexual Activity  Drug Use Not Currently   Comment: She denies     Social History   Socioeconomic History   Marital status: Single    Spouse name: Not on file   Number of children: Not on file   Years of education: Not on file   Highest education level: Not on file  Occupational History   Occupation: Disability  Tobacco Use   Smoking status: Never Smoker   Smokeless tobacco: Never Used  Building services engineer Use: Never used  Substance and Sexual Activity   Alcohol use: Not Currently    Comment: She denies    Drug use: Not Currently    Comment: She denies    Sexual activity: Not Currently  Other Topics Concern   Not on file  Social History Narrative   Pt stated that she lives in [place], and that she lives alone.  Pt receives outpatient psychiatric resources through [facility].   Social Determinants of Health   Financial Resource Strain:    Difficulty of Paying Living Expenses:   Food Insecurity:    Worried About Programme researcher, broadcasting/film/video in the Last Year:    Barista in the Last Year:   Transportation Needs:  Lack of Transportation (Medical):    Lack of Transportation (Non-Medical):   Physical Activity:    Days of Exercise per Week:    Minutes of Exercise per Session:   Stress:    Feeling of Stress :   Social Connections:    Frequency of Communication with Friends and Family:    Frequency of Social Gatherings with Friends and Family:    Attends Religious Services:    Active Member of Clubs or Organizations:    Attends Banker Meetings:    Marital Status:    Additional Social History:                         Sleep: Good  Appetite:  Good  Current Medications: Current  Facility-Administered Medications  Medication Dose Route Frequency Provider Last Rate Last Admin   acetaminophen (TYLENOL) tablet 650 mg  650 mg Oral Q6H PRN Money, Gerlene Burdock, FNP   650 mg at 05/22/20 0815   alum & mag hydroxide-simeth (MAALOX/MYLANTA) 200-200-20 MG/5ML suspension 30 mL  30 mL Oral Q4H PRN Money, Feliz Beam B, FNP       benztropine (COGENTIN) tablet 1 mg  1 mg Oral BID Money, Gerlene Burdock, FNP   1 mg at 05/24/20 5885   chlorproMAZINE (THORAZINE) tablet 100 mg  100 mg Oral TID Antonieta Pert, MD   100 mg at 05/24/20 1210   magnesium hydroxide (MILK OF MAGNESIA) suspension 30 mL  30 mL Oral Daily PRN Money, Gerlene Burdock, FNP       [START ON 05/31/2020] paliperidone (INVEGA SUSTENNA) injection 234 mg  234 mg Intramuscular Q28 days Money, Feliz Beam B, FNP       risperiDONE (RISPERDAL M-TABS) disintegrating tablet 2 mg  2 mg Oral Q8H PRN Antonieta Pert, MD   2 mg at 05/24/20 0932   And   ziprasidone (GEODON) injection 20 mg  20 mg Intramuscular PRN Antonieta Pert, MD       risperiDONE (RISPERDAL M-TABS) disintegrating tablet 3 mg  3 mg Oral Daily Antonieta Pert, MD   3 mg at 05/24/20 0805   risperiDONE (RISPERDAL M-TABS) disintegrating tablet 4 mg  4 mg Oral QHS Antonieta Pert, MD   4 mg at 05/23/20 2054   sertraline (ZOLOFT) tablet 50 mg  50 mg Oral Daily Money, Gerlene Burdock, FNP   50 mg at 05/24/20 0277   traZODone (DESYREL) tablet 150 mg  150 mg Oral QHS Money, Gerlene Burdock, FNP   150 mg at 05/23/20 2054    Lab Results: No results found for this or any previous visit (from the past 48 hour(s)).  Blood Alcohol level:  Lab Results  Component Value Date   ETH <10 05/19/2020   ETH <10 05/11/2020    Metabolic Disorder Labs: Lab Results  Component Value Date   HGBA1C 5.0 05/19/2020   MPG 96.8 05/19/2020   MPG 91.06 08/06/2019   Lab Results  Component Value Date   PROLACTIN 28.9 (H) 06/26/2018   Lab Results  Component Value Date   CHOL 181 05/19/2020   TRIG 83 05/19/2020    HDL 43 05/19/2020   CHOLHDL 4.2 05/19/2020   VLDL 17 05/19/2020   LDLCALC 121 (H) 05/19/2020   LDLCALC 104 (H) 09/19/2019    Physical Findings: AIMS: Facial and Oral Movements Muscles of Facial Expression: None, normal Lips and Perioral Area: None, normal Jaw: None, normal Tongue: None, normal,Extremity Movements Upper (arms, wrists, hands, fingers): None, normal  Lower (legs, knees, ankles, toes): None, normal, Trunk Movements Neck, shoulders, hips: None, normal, Overall Severity Severity of abnormal movements (highest score from questions above): None, normal Incapacitation due to abnormal movements: None, normal Patient's awareness of abnormal movements (rate only patient's report): No Awareness, Dental Status Current problems with teeth and/or dentures?: No Does patient usually wear dentures?: No  CIWA:  CIWA-Ar Total: 0 COWS:  COWS Total Score: 0  Musculoskeletal: Strength & Muscle Tone: within normal limits Gait & Station: unsteady Patient leans: N/A  Psychiatric Specialty Exam: Physical Exam Neurological:     Mental Status: She is alert and oriented to person, place, and time.     Review of Systems  Blood pressure 105/69, pulse (!) 122, temperature 97.6 F (36.4 C), temperature source Oral, resp. rate 18, height 4\' 10"  (1.473 m), weight 53.5 kg, last menstrual period 05/19/2020, SpO2 100 %.Body mass index is 24.66 kg/m.  General Appearance: Casual  Eye Contact:  Fair  Speech:  Slow  Volume:  Decreased  Mood:  Anxious  Affect:  Restricted  Thought Process:  Disorganized  Orientation:  Full (Time, Place, and Person)  Thought Content:  WDL  Suicidal Thoughts:  No  Homicidal Thoughts:  No  Memory:  Immediate;   Poor Recent;   Poor  Judgement:  Impaired  Insight:  Fair  Psychomotor Activity:  Normal  Concentration:  Concentration: Fair and Attention Span: Fair  Recall:  05/21/2020 of Knowledge:  Fair  Language:  Fair  Akathisia:  No  Handed:  Right  AIMS  (if indicated):     Assets:  Communication Skills Desire for Improvement Resilience  ADL's:  Intact  Cognition:  WNL  Sleep:  Number of Hours: 6    Assessment: Patient is a 44 year old female who was admitted to the hospital secondary to worsening schizophrenia symptoms and failure to be able to be adequately treated in the community. She remains paranoid, delusional and have auditory hallucinations.   Treatment Plan Summary: Daily contact with patient to assess and evaluate symptoms and progress in treatment.   1. Continue Risperdal 3 mg BID PO daily for psychosis and intrusions. 2. Continue Risperdal 4 mg PO daily QHS. 2. Continue Cogentin 1 mg BID for any EPS 3. Invega 156mg /ml IM on 05/31/20 4.Continue Sertraline 50 mg Po daily for depression 5. Continue Trazodone 150 mg for sleep QHS daily. 6. Ziprasidone 20 mg IM PRN for agitation. 7.Continue Thorazine 100 mg PO TID 8. Encouragement to attend group therapy. 9. Disposition in progress.   , MD 05/24/2020, 12:22 PM

## 2020-05-25 NOTE — Progress Notes (Addendum)
Pt A & O to self, place and situation. Visible in dayroom majority of this shift. Denies SI, HI, AVH and pain when assessed. Continues to whisper, sing to self in a low pitch voice, intrusive on interactions but is easily redirected when engaged. Rates her depression 5/10, hopelessness  5/10 and anxiety 3/10. Pt verbally contracts for safety. Endorsed being "paranoid especially when I'm home". Pt's goal for today is to "overcome paranoia so that I can go home and feel safe".  Scheduled medications administered as ordered with verbal education. Emotional support and verbal encouragement offered to patient throughout this shift. Q 15 minutes safety checks maintained without self harm gestures. Patient went off unit for meals and returned without issues. Tolerated all PO intake well. Denies concerns at this time.

## 2020-05-25 NOTE — BHH Counselor (Signed)
CSW met with pt to discuss referral to [facility] ACTT Team. Pt stated she already had an assessment with them about 1 week ago. CSW called Afifa with [facility] at [phone] and confirmed pt was assessed but they are still waiting on [facility] to approve. Afifa asked to speak with pt. CSW was able to include pt on speaker phone. Pt was able to speak with Afifa and explained that she is okay. Afifa asks pt to call post-discharge if she is having any challenges. Afifa will follow up with pt once [facility] approves ACTT services.

## 2020-05-25 NOTE — Progress Notes (Signed)
[facility] MD Progress Note  05/25/2020 8:17 AM [name] [name]  MRN:  [id number] Subjective: Patient states Jesus is saying "I am going to leave you alone now". She admits to auditory hallucinations allofmorning and when they increased in frequency she asked for her medications. She asks " How am I doing" " I feel okay and you think I am ready to leave from here".She denies any suicidal ideations.She states she slept well and ate her breakfast this morning. She states she needs help with printing out some documents for her classes as she has her test on Friday. Denies any HI or visual hallucinations.  Objective: Patient is seen and examined.Patientis an 44 y.o.female.who presented [facility] voluntarilycomplaining of auditory hallucinations. She is alert, oriented and copperative. She seems to be responding to the internal stimuli.She has fixed delusions about Jesus.She is still intrusive on the unit but frequency has decreased and she looks calmer today. She has restricted affect but smiles at times and first time today shook hand at the end of the interview and showed her paperwork. She is not showing any self injurious behavior on the unit.Her blood pressure 113/65, pulse 111, temperature 97.6 F (36.4 C). No new labs today.   SW and ACTT-  According to SW -She's already done an assessment for [facility]. They will take her but are waiting on [facility] to approve ACTT services. They said it could take another week before she is either approved or denied by [facility].  Principal Problem: <principal problem not specified> Diagnosis: Active Problems:   Schizophrenia (HCC)  Total Time spent with patient: 25 minutes  Past Psychiatric History: See H & P  Past Medical History:  Past Medical History:  Diagnosis Date  . Arthritis   . Depression   . Hallucinations   . Paranoid schizophrenia (HCC)   . Schizoaffective disorder (HCC)    History reviewed. No pertinent surgical  history. Family History: History reviewed. No pertinent family history. Family Psychiatric  History: See H & P Social History:  Social History   Substance and Sexual Activity  Alcohol Use Not Currently   Comment: She denies      Social History   Substance and Sexual Activity  Drug Use Not Currently   Comment: She denies     Social History   Socioeconomic History  . Marital status: Single    Spouse name: Not on file  . Number of children: Not on file  . Years of education: Not on file  . Highest education level: Not on file  Occupational History  . Occupation: Disability  Tobacco Use  . Smoking status: Never Smoker  . Smokeless tobacco: Never Used  Vaping Use  . Vaping Use: Never used  Substance and Sexual Activity  . Alcohol use: Not Currently    Comment: She denies   . Drug use: Not Currently    Comment: She denies   . Sexual activity: Not Currently  Other Topics Concern  . Not on file  Social History Narrative   Pt stated that she lives in [place], and that she lives alone.  Pt receives outpatient psychiatric resources through [facility].   Social Determinants of Health   Financial Resource Strain:   . Difficulty of Paying Living Expenses:   Food Insecurity:   . Worried About Programme researcher, broadcasting/film/video in the Last Year:   . Barista in the Last Year:   Transportation Needs:   . Freight forwarder (Medical):   Marland Kitchen  Lack of Transportation (Non-Medical):   Physical Activity:   . Days of Exercise per Week:   . Minutes of Exercise per Session:   Stress:   . Feeling of Stress :   Social Connections:   . Frequency of Communication with Friends and Family:   . Frequency of Social Gatherings with Friends and Family:   . Attends Religious Services:   . Active Member of Clubs or Organizations:   . Attends Banker Meetings:   Marland Kitchen Marital Status:    Additional Social History:                         Sleep: Good  Appetite:   Good  Current Medications: Current Facility-Administered Medications  Medication Dose Route Frequency Provider Last Rate Last Admin  . acetaminophen (TYLENOL) tablet 650 mg  650 mg Oral Q6H PRN Money, Gerlene Burdock, FNP   650 mg at 05/22/20 0815  . alum & mag hydroxide-simeth (MAALOX/MYLANTA) 200-200-20 MG/5ML suspension 30 mL  30 mL Oral Q4H PRN Money, Feliz Beam B, FNP      . benztropine (COGENTIN) tablet 1 mg  1 mg Oral BID Money, Gerlene Burdock, FNP   1 mg at 05/25/20 9357  . chlorproMAZINE (THORAZINE) tablet 100 mg  100 mg Oral TID Antonieta Pert, MD   100 mg at 05/25/20 0612  . magnesium hydroxide (MILK OF MAGNESIA) suspension 30 mL  30 mL Oral Daily PRN Money, Gerlene Burdock, FNP      . [START ON 05/31/2020] paliperidone (INVEGA SUSTENNA) injection 234 mg  234 mg Intramuscular Q28 days Money, Gerlene Burdock, FNP      . risperiDONE (RISPERDAL M-TABS) disintegrating tablet 2 mg  2 mg Oral Q8H PRN Antonieta Pert, MD   2 mg at 05/24/20 0932   And  . ziprasidone (GEODON) injection 20 mg  20 mg Intramuscular PRN Antonieta Pert, MD      . risperiDONE (RISPERDAL M-TABS) disintegrating tablet 3 mg  3 mg Oral Daily Antonieta Pert, MD   3 mg at 05/25/20 0177  . risperiDONE (RISPERDAL M-TABS) disintegrating tablet 4 mg  4 mg Oral QHS Antonieta Pert, MD   4 mg at 05/24/20 2148  . sertraline (ZOLOFT) tablet 50 mg  50 mg Oral Daily Money, Gerlene Burdock, FNP   50 mg at 05/25/20 9390  . traZODone (DESYREL) tablet 150 mg  150 mg Oral QHS Money, Gerlene Burdock, FNP   150 mg at 05/24/20 2148    Lab Results: No results found for this or any previous visit (from the past 48 hour(s)).  Blood Alcohol level:  Lab Results  Component Value Date   ETH <10 05/19/2020   ETH <10 05/11/2020    Metabolic Disorder Labs: Lab Results  Component Value Date   HGBA1C 5.0 05/19/2020   MPG 96.8 05/19/2020   MPG 91.06 08/06/2019   Lab Results  Component Value Date   PROLACTIN 28.9 (H) 06/26/2018   Lab Results  Component Value  Date   CHOL 181 05/19/2020   TRIG 83 05/19/2020   HDL 43 05/19/2020   CHOLHDL 4.2 05/19/2020   VLDL 17 05/19/2020   LDLCALC 121 (H) 05/19/2020   LDLCALC 104 (H) 09/19/2019    Physical Findings: AIMS: Facial and Oral Movements Muscles of Facial Expression: None, normal Lips and Perioral Area: None, normal Jaw: None, normal Tongue: None, normal,Extremity Movements Upper (arms, wrists, hands, fingers): None, normal Lower (legs, knees, ankles, toes): None, normal,  Trunk Movements Neck, shoulders, hips: None, normal, Overall Severity Severity of abnormal movements (highest score from questions above): None, normal Incapacitation due to abnormal movements: None, normal Patient's awareness of abnormal movements (rate only patient's report): No Awareness, Dental Status Current problems with teeth and/or dentures?: No Does patient usually wear dentures?: No  CIWA:  CIWA-Ar Total: 0 COWS:  COWS Total Score: 0  Musculoskeletal: Strength & Muscle Tone: within normal limits Gait & Station: unsteady Patient leans: N/A  Psychiatric Specialty Exam: Physical Exam Neurological:     Mental Status: She is alert and oriented to person, place, and time.  Psychiatric:        Behavior: Behavior normal.     Review of Systems  Blood pressure 113/65, pulse (!) 111, temperature 97.7 F (36.5 C), temperature source Oral, resp. rate 18, height 4\' 10"  (1.473 m), weight 53.5 kg, last menstrual period 05/19/2020, SpO2 100 %.Body mass index is 24.66 kg/m.  General Appearance: Casual  Eye Contact:  Fair  Speech:  Slow  Volume:  Decreased  Mood:  Anxious  Affect:  Restricted  Thought Process:  Goal Directed  Orientation:  Full (Time, Place, and Person)  Thought Content:  WDL  Suicidal Thoughts:  No  Homicidal Thoughts:  No  Memory:  Immediate;   Fair  Judgement:  Impaired  Insight:  Lacking  Psychomotor Activity:  Normal  Concentration:  Concentration: Fair  Recall:  05/21/2020 of Knowledge:   Fair  Language:  Good  Akathisia:  No  Handed:  Right  AIMS (if indicated):     Assets:  Desire for Improvement Resilience  ADL's:  Intact  Cognition:  WNL  Sleep:  Number of Hours: 6   Assessment:Patient is a 44 year old female who was admitted to the hospital secondary to worsening schizophrenia symptoms and failure to be able to be adequately treated in the community.She remains paranoid, delusional and have auditory hallucinations. She looks better, has positive fixed delusions, less intrusive but looks little lethargic.   Treatment Plan Summary: Daily contact with patient to assess and evaluate symptoms and progress in treatment.   1.ContinueRisperdal 3 mg BID PO daily for psychosis and intrusions. 2. ContinueRisperdal 4mg  PO daily QHS. 2.Continue Cogentin 1 mg BID for any EPS 3. Invega 156mg /ml IM on 05/31/20 4.Continue Sertraline 50 mg Po daily for depression 5. Continue Trazodone 150 mg for sleep QHS daily. 6. Ziprasidone 20 mg IM PRN for agitation. 7.Continue Thorazine 100 mg PO TID 8. Encouragement to attend group therapy. 9. Disposition in progress.  , MD 05/25/2020, 8:17 AM

## 2020-05-25 NOTE — Progress Notes (Signed)
Pt up responding to internal stimuli , pt requesting morning medication.

## 2020-05-26 MED ORDER — RISPERIDONE 1 MG PO TBDP
3.0000 mg | ORAL_TABLET | Freq: Two times a day (BID) | ORAL | Status: DC
  Administered 2020-05-26 – 2020-05-27 (×2): 3 mg via ORAL
  Filled 2020-05-26 (×5): qty 3

## 2020-05-26 NOTE — Progress Notes (Signed)
   05/26/20 2015  Psych Admission Type (Psych Patients Only)  Admission Status Voluntary  Psychosocial Assessment  Patient Complaints Anxiety  Eye Contact Brief  Facial Expression Flat  Affect Anxious  Speech Soft  Interaction Guarded  Motor Activity Slow  Appearance/Hygiene Unremarkable  Behavior Characteristics Cooperative  Mood Anxious  Thought Process  Coherency Blocking  Content WDL  Delusions None reported or observed  Perception WDL  Hallucination None reported or observed  Judgment Impaired  Confusion None  Danger to Self  Current suicidal ideation? Denies  Danger to Others  Danger to Others None reported or observed   Pt seen sitting in dayroom. Pt denies SI, HI and AVH.

## 2020-05-26 NOTE — Plan of Care (Signed)
D: Patient alert and oriented X3,  Affect is restricted/Constricted. She denies SI, HI, AVH at this time though she is observed talking to unseen others and at times yelling out loud. She became tearful at one point stating that "Jesus wants to kill me , why won't he leave me alone?" she was offered PRN Risperdal but she declined stating "I have been taking my medications but this voices won't leave me alone." She was able to calm self down with redirection with no further interventions. She reported her goal for the day is to "try and work on my paranoia and anxiety so I can get out of here." Pt reports good sleep, appetite and elimination and denies any other concerns or discomfort.   A: Scheduled medications administered to patient per MD order. Positive reinforcement, support and encouragement provided. Routine safety checks conducted every 15 minutes. Patient informed and encouraged to  notify staff with problems or concerns.   R: Pt voiced no adverse drug reactions at this time. She remains safe and is  compliant with medications and POC. Patient is receptive, calm, and cooperative. She however remains withdrawn to self with no interaction with her peers. No unsafe behavior noted thus far, support provided  As needed.  Problem: Safety: Goal: Periods of time without injury will increase Outcome: Progressing   Problem: Coping: Goal: Will verbalize feelings Outcome: Progressing   Problem: Safety: Goal: Ability to redirect hostility and anger into socially appropriate behaviors will improve Outcome: Progressing   Problem: Coping: Goal: Ability to demonstrate self-control will improve Outcome: Not Progressing   Problem: Education: Goal: Will be free of psychotic symptoms Outcome: Not Progressing

## 2020-05-26 NOTE — Progress Notes (Signed)
[facility] MD Progress Note  05/26/2020 11:13 AM [name] [name]  MRN:  [id number] Subjective:  Patient states Jesus is saying "I am going to make you my wife and my nephew from new [place] need to move in with me". She admits to auditory hallucinations allofmorning and stating she just want them to go away. She denies any suicidal ideations.She states she slept well and ate her breakfastthis morning.She is less intrusive today and states She states she needs to go home to send her application for food stamps otherwise they will cancel it for her.Denies any HI or visual hallucinations.  Objective:Patient is seen and examined.Patientis an 44 y.o.female.who presented [facility] voluntarilycomplaining of auditory hallucinations. She is alert,oriented and copperative. She seems to be responding to the internal stimuli.She has fixed delusionsabout Jesus.She knows about her ACTT and how it is changing for now. She has restricted affect but smiles at times. She is not showing any self injurious behavior on the unit.Her blood pressure 115/75, pulse 127, temperature 98.2 F (36.8 C). No new labstoday. Principal Problem: <principal problem not specified> Diagnosis: Active Problems:   Schizophrenia (HCC)  Total Time spent with patient: 25 minutes  Past Psychiatric History: See H & P  Past Medical History:  Past Medical History:  Diagnosis Date   Arthritis    Depression    Hallucinations    Paranoid schizophrenia (HCC)    Schizoaffective disorder (HCC)    History reviewed. No pertinent surgical history. Family History: History reviewed. No pertinent family history. Family Psychiatric  History: See  H & P Social History:  Social History   Substance and Sexual Activity  Alcohol Use Not Currently   Comment: She denies      Social History   Substance and Sexual Activity  Drug Use Not Currently   Comment: She denies     Social History   Socioeconomic History   Marital status:  Single    Spouse name: Not on file   Number of children: Not on file   Years of education: Not on file   Highest education level: Not on file  Occupational History   Occupation: Disability  Tobacco Use   Smoking status: Never Smoker   Smokeless tobacco: Never Used  Building services engineer Use: Never used  Substance and Sexual Activity   Alcohol use: Not Currently    Comment: She denies    Drug use: Not Currently    Comment: She denies    Sexual activity: Not Currently  Other Topics Concern   Not on file  Social History Narrative   Pt stated that she lives in [place], and that she lives alone.  Pt receives outpatient psychiatric resources through [employer].   Social Determinants of Health   Financial Resource Strain:    Difficulty of Paying Living Expenses:   Food Insecurity:    Worried About Programme researcher, broadcasting/film/video in the Last Year:    Barista in the Last Year:   Transportation Needs:    Freight forwarder (Medical):    Lack of Transportation (Non-Medical):   Physical Activity:    Days of Exercise per Week:    Minutes of Exercise per Session:   Stress:    Feeling of Stress :   Social Connections:    Frequency of Communication with Friends and Family:    Frequency of Social Gatherings with Friends and Family:    Attends Religious Services:    Active Member of Clubs or Organizations:  Attends Banker Meetings:    Marital Status:    Additional Social History:                         Sleep: Good  Appetite:  Good  Current Medications: Current Facility-Administered Medications  Medication Dose Route Frequency Provider Last Rate Last Admin   acetaminophen (TYLENOL) tablet 650 mg  650 mg Oral Q6H PRN Money, Gerlene Burdock, FNP   650 mg at 05/22/20 0815   alum & mag hydroxide-simeth (MAALOX/MYLANTA) 200-200-20 MG/5ML suspension 30 mL  30 mL Oral Q4H PRN Money, Feliz Beam B, FNP       benztropine (COGENTIN)  tablet 1 mg  1 mg Oral BID Money, Gerlene Burdock, FNP   1 mg at 05/26/20 4270   chlorproMAZINE (THORAZINE) tablet 100 mg  100 mg Oral TID Antonieta Pert, MD   100 mg at 05/26/20 1107   magnesium hydroxide (MILK OF MAGNESIA) suspension 30 mL  30 mL Oral Daily PRN Money, Gerlene Burdock, FNP       [START ON 05/31/2020] paliperidone (INVEGA SUSTENNA) injection 234 mg  234 mg Intramuscular Q28 days Money, Feliz Beam B, FNP       risperiDONE (RISPERDAL M-TABS) disintegrating tablet 2 mg  2 mg Oral Q8H PRN Antonieta Pert, MD   2 mg at 05/24/20 0932   And   ziprasidone (GEODON) injection 20 mg  20 mg Intramuscular PRN Antonieta Pert, MD       risperiDONE (RISPERDAL M-TABS) disintegrating tablet 3 mg  3 mg Oral Daily Antonieta Pert, MD   3 mg at 05/26/20 0749   risperiDONE (RISPERDAL M-TABS) disintegrating tablet 4 mg  4 mg Oral QHS Antonieta Pert, MD   4 mg at 05/25/20 2044   sertraline (ZOLOFT) tablet 50 mg  50 mg Oral Daily Money, Gerlene Burdock, FNP   50 mg at 05/26/20 0748   traZODone (DESYREL) tablet 150 mg  150 mg Oral QHS Money, Gerlene Burdock, FNP   150 mg at 05/25/20 2043    Lab Results: No results found for this or any previous visit (from the past 48 hour(s)).  Blood Alcohol level:  Lab Results  Component Value Date   ETH <10 05/19/2020   ETH <10 05/11/2020    Metabolic Disorder Labs: Lab Results  Component Value Date   HGBA1C 5.0 05/19/2020   MPG 96.8 05/19/2020   MPG 91.06 08/06/2019   Lab Results  Component Value Date   PROLACTIN 28.9 (H) 06/26/2018   Lab Results  Component Value Date   CHOL 181 05/19/2020   TRIG 83 05/19/2020   HDL 43 05/19/2020   CHOLHDL 4.2 05/19/2020   VLDL 17 05/19/2020   LDLCALC 121 (H) 05/19/2020   LDLCALC 104 (H) 09/19/2019    Physical Findings: AIMS: Facial and Oral Movements Muscles of Facial Expression: None, normal Lips and Perioral Area: None, normal Jaw: None, normal Tongue: None, normal,Extremity Movements Upper (arms, wrists,  hands, fingers): None, normal Lower (legs, knees, ankles, toes): None, normal, Trunk Movements Neck, shoulders, hips: None, normal, Overall Severity Severity of abnormal movements (highest score from questions above): None, normal Incapacitation due to abnormal movements: None, normal Patient's awareness of abnormal movements (rate only patient's report): No Awareness, Dental Status Current problems with teeth and/or dentures?: No Does patient usually wear dentures?: No  CIWA:  CIWA-Ar Total: 0 COWS:  COWS Total Score: 0  Musculoskeletal: Strength & Muscle Tone: within normal limits Gait & Station:  unsteady Patient leans: N/A  Psychiatric Specialty Exam: Physical Exam Neurological:     Mental Status: She is oriented to person, place, and time.  Psychiatric:        Behavior: Behavior normal.     Review of Systems  Blood pressure 115/75, pulse (!) 127, temperature 98.2 F (36.8 C), temperature source Oral, resp. rate 18, height 4\' 10"  (1.473 m), weight 53.5 kg, last menstrual period 05/19/2020, SpO2 100 %.Body mass index is 24.66 kg/m.  General Appearance: Casual  Eye Contact:  Fair  Speech:  Slow  Volume:  Decreased  Mood:  Depressed  Affect:  Restricted  Thought Process:  Goal Directed  Orientation:  Full (Time, Place, and Person)  Thought Content:  WDL  Suicidal Thoughts:  No  Homicidal Thoughts:  No  Memory:  Immediate;   Fair Recent;   Fair  Judgement:  Fair  Insight:  Fair  Psychomotor Activity:  Normal  Concentration:  Concentration: Good  Recall:  Fair  Fund of Knowledge:  Fair  Language:  Fair  Akathisia:  No  Handed:  Right  AIMS (if indicated):     Assets:  Desire for Improvement Resilience  ADL's:  Intact  Cognition:  WNL  Sleep:  Number of Hours: 8.5   Assessment:Patient is a 43 year old female who was admitted to the hospital secondary to worsening schizophrenia symptoms and failure to be able to be adequately treated in the community.She remains   fixed delusions, less intrusive and fixated on her discharge today.   Treatment Plan Summary: Daily contact with patient to assess and evaluate symptoms and progress in treatment.   1.ContinueRisperdal 3 mg BID PO daily for psychosis and intrusions. 2. ContinueRisperdal 4mg  PO daily QHS. 2.Continue Cogentin 1 mg BID for any EPS 3. Invega 156mg /ml IM on 05/31/20 4.Continue Sertraline 50 mg Po daily for depression 5. Continue Trazodone 150 mg for sleep QHS daily. 6. Ziprasidone 20 mg IM PRN for agitation. 7.Continue Thorazine100 mg PO TID 8. Encouragement to attend group therapy. 9. Disposition in progress.  , MD 05/26/2020, 11:13 AM

## 2020-05-26 NOTE — Tx Team (Cosign Needed)
Interdisciplinary Treatment and Diagnostic Plan Update  05/26/2020 Time of Session: 8:43 AM  Eddy Termine MRN: [id number]  Principal Diagnosis: <principal problem not specified>  Secondary Diagnoses: Active Problems:   Schizophrenia (HCC)   Current Medications:  Current Facility-Administered Medications  Medication Dose Route Frequency Provider Last Rate Last Admin  . acetaminophen (TYLENOL) tablet 650 mg  650 mg Oral Q6H PRN Money, Gerlene Burdock, FNP   650 mg at 05/22/20 0815  . alum & mag hydroxide-simeth (MAALOX/MYLANTA) 200-200-20 MG/5ML suspension 30 mL  30 mL Oral Q4H PRN Money, Feliz Beam B, FNP      . benztropine (COGENTIN) tablet 1 mg  1 mg Oral BID Money, Gerlene Burdock, FNP   1 mg at 05/26/20 0748  . chlorproMAZINE (THORAZINE) tablet 100 mg  100 mg Oral TID Antonieta Pert, MD   100 mg at 05/26/20 0748  . magnesium hydroxide (MILK OF MAGNESIA) suspension 30 mL  30 mL Oral Daily PRN Money, Gerlene Burdock, FNP      . [START ON 05/31/2020] paliperidone (INVEGA SUSTENNA) injection 234 mg  234 mg Intramuscular Q28 days Money, Gerlene Burdock, FNP      . risperiDONE (RISPERDAL M-TABS) disintegrating tablet 2 mg  2 mg Oral Q8H PRN Antonieta Pert, MD   2 mg at 05/24/20 0932   And  . ziprasidone (GEODON) injection 20 mg  20 mg Intramuscular PRN Antonieta Pert, MD      . risperiDONE (RISPERDAL M-TABS) disintegrating tablet 3 mg  3 mg Oral Daily Antonieta Pert, MD   3 mg at 05/26/20 0749  . risperiDONE (RISPERDAL M-TABS) disintegrating tablet 4 mg  4 mg Oral QHS Antonieta Pert, MD   4 mg at 05/25/20 2044  . sertraline (ZOLOFT) tablet 50 mg  50 mg Oral Daily Money, Gerlene Burdock, FNP   50 mg at 05/26/20 0748  . traZODone (DESYREL) tablet 150 mg  150 mg Oral QHS Money, Gerlene Burdock, FNP   150 mg at 05/25/20 2043    PTA Medications: Medications Prior to Admission  Medication Sig Dispense Refill Last Dose  . benztropine (COGENTIN) 1 MG tablet Take 1 tablet (1 mg total) by mouth 2 (two) times daily. 60  tablet 3   . INVEGA SUSTENNA 156 MG/ML SUSY injection Inject 156 mg into the muscle every 28 (twenty-eight) days.      Marland Kitchen OLANZapine (ZYPREXA) 15 MG tablet Take 15 mg by mouth at bedtime.     . risperidone (RISPERDAL) 4 MG tablet Take 1 tablet (4 mg total) by mouth 2 (two) times daily. 60 tablet 2   . sertraline (ZOLOFT) 50 MG tablet Take 1 tablet (50 mg total) by mouth daily. 30 tablet 0   . traZODone (DESYREL) 150 MG tablet Take 1 tablet (150 mg total) by mouth at bedtime as needed and may repeat dose one time if needed for sleep. 90 tablet 1     Patient Stressors: Marital or family conflict Medication change or noncompliance  Patient Strengths: Geographical information systems officer for treatment/growth Supportive family/friends  Treatment Modalities: Medication Management, Group therapy, Case management,  1 to 1 session with clinician, Psychoeducation, Recreational therapy.   Physician Treatment Plan for Primary Diagnosis: <principal problem not specified> Long Term Goal(s): Improvement in symptoms so as ready for discharge  Short Term Goals: Compliance with prescribed medications will improve Ability to disclose and discuss suicidal ideas Ability to demonstrate self-control will improve Ability to maintain clinical measurements within normal limits will improve  Medication Management:  Evaluate patient's response, side effects, and tolerance of medication regimen.  Therapeutic Interventions: 1 to 1 sessions, Unit Group sessions and Medication administration.  Evaluation of Outcomes: Progressing  Physician Treatment Plan for Secondary Diagnosis: Active Problems:   Schizophrenia (HCC)   Long Term Goal(s): Improvement in symptoms so as ready for discharge  Short Term Goals: Compliance with prescribed medications will improve Ability to disclose and discuss suicidal ideas Ability to demonstrate self-control will improve Ability to maintain clinical measurements within normal  limits will improve  Medication Management: Evaluate patient's response, side effects, and tolerance of medication regimen.  Therapeutic Interventions: 1 to 1 sessions, Unit Group sessions and Medication administration.  Evaluation of Outcomes: Progressing   RN Treatment Plan for Primary Diagnosis: <principal problem not specified> Long Term Goal(s): Knowledge of disease and therapeutic regimen to maintain health will improve  Short Term Goals: Ability to verbalize feelings will improve, Ability to identify and develop effective coping behaviors will improve and Compliance with prescribed medications will improve  Medication Management: RN will administer medications as ordered by provider, will assess and evaluate patient's response and provide education to patient for prescribed medication. RN will report any adverse and/or side effects to prescribing provider.  Therapeutic Interventions: 1 on 1 counseling sessions, Psychoeducation, Medication administration, Evaluate responses to treatment, Monitor vital signs and CBGs as ordered, Perform/monitor CIWA, COWS, AIMS and Fall Risk screenings as ordered, Perform wound care treatments as ordered.  Evaluation of Outcomes: Progressing   LCSW Treatment Plan for Primary Diagnosis: <principal problem not specified> Long Term Goal(s): Safe transition to appropriate next level of care at discharge, Engage patient in therapeutic group addressing interpersonal concerns.  Short Term Goals: Engage patient in aftercare planning with referrals and resources and Increase skills for wellness and recovery  Therapeutic Interventions: Assess for all discharge needs, 1 to 1 time with Social worker, Explore available resources and support systems, Assess for adequacy in community support network, Educate family and significant other(s) on suicide prevention, Complete Psychosocial Assessment, Interpersonal group therapy.  Evaluation of Outcomes: Progressing    Patient is being referred to [facility] ACT team-they have accepted her but are awaiting [facility] approval   Progress in Treatment: Attending groups: Yes Participating in groups: Yes Taking medication as prescribed: Yes Toleration medication: Yes, no side effects reported at this time Family/Significant other contact made:  Patient understands diagnosis: Yes AEB Discussing patient identified problems/goals with staff: Yes Medical problems stabilized or resolved: Yes Denies suicidal/homicidal ideation: Yes Issues/concerns per patient self-inventory: None Other: N/A  New problem(s) identified: None identified at this time.   New Short Term/Long Term Goal(s): "I want to go home."   Discharge Plan or Barriers:   Reason for Continuation of Hospitalization:  Delusions  Depression  Medication stabilization   Estimated Length of Stay: 1-2 days  Attendees: Patient: Mattera 05/26/2020  8:43 AM  Physician: Jama Flavors, MD 05/26/2020  8:43 AM  Nursing: Vikki Ports  RN 05/26/2020  8:43 AM  Other:   Social Worker: Richelle Ito 05/26/2020  8:43 AM  Recreational Therapist:  05/26/2020  8:43 AM          Scribe for Treatment Team:  Daryel Gerald LCSW 05/26/2020 8:43 AM

## 2020-05-27 MED ORDER — RISPERIDONE 2 MG PO TBDP: 2.0000 mg | ORAL_TABLET | Freq: Two times a day (BID) | ORAL | 0 refills | Status: DC

## 2020-05-27 MED ORDER — SERTRALINE HCL 50 MG PO TABS: 50.0000 mg | ORAL_TABLET | Freq: Every day | ORAL | 0 refills | Status: DC

## 2020-05-27 MED ORDER — BENZTROPINE MESYLATE 1 MG PO TABS: 1.0000 mg | ORAL_TABLET | Freq: Two times a day (BID) | ORAL | 0 refills | Status: DC

## 2020-05-27 MED ORDER — RISPERIDONE 3 MG PO TBDP
3.0000 mg | ORAL_TABLET | Freq: Two times a day (BID) | ORAL | Status: DC
  Filled 2020-05-27 (×2): qty 1

## 2020-05-27 MED ORDER — TRAZODONE HCL 150 MG PO TABS: 150.0000 mg | ORAL_TABLET | Freq: Every day | ORAL | 0 refills | Status: DC

## 2020-05-27 MED ORDER — RISPERIDONE 2 MG PO TBDP
2.0000 mg | ORAL_TABLET | Freq: Two times a day (BID) | ORAL | Status: DC
  Filled 2020-05-27 (×2): qty 1

## 2020-05-27 MED ORDER — RISPERIDONE 3 MG PO TBDP: 3.0000 mg | ORAL_TABLET | Freq: Two times a day (BID) | ORAL | 0 refills | Status: DC

## 2020-05-27 MED ORDER — RISPERIDONE 2 MG PO TBDP: 3.0000 mg | ORAL_TABLET | Freq: Two times a day (BID) | ORAL | Status: DC

## 2020-05-27 MED ORDER — PALIPERIDONE PALMITATE ER 234 MG/1.5ML IM SUSY: 234.0000 mg | PREFILLED_SYRINGE | INTRAMUSCULAR | 0 refills | Status: DC

## 2020-05-27 MED ORDER — CHLORPROMAZINE HCL 100 MG PO TABS: 100.0000 mg | ORAL_TABLET | Freq: Three times a day (TID) | ORAL | 0 refills | Status: DC

## 2020-05-27 NOTE — Progress Notes (Signed)
Recreation Therapy Notes  INPATIENT RECREATION TR PLAN  Patient Details Name: [name] [name] MRN: [id number] DOB: [birth year] Today's Date: 05/27/2020  Rec Therapy Plan Is patient appropriate for Therapeutic Recreation?: Yes Treatment times per week: about 3 days Estimated Length of Stay: 5-7 days TR Treatment/Interventions: Group participation (Comment)  Discharge Criteria Pt will be discharged from therapy if:: Discharged Treatment plan/goals/alternatives discussed and agreed upon by:: Patient/family  Discharge Summary Short term goals set: See patient care plan Short term goals met: Adequate for discharge Progress toward goals comments: Groups attended Which groups?: Coping skills, Other (Comment) (Triggers) Reason goals not met: See notes Therapeutic equipment acquired: N/A Reason patient discharged from therapy: Discharge from hospital Pt/family agrees with progress & goals achieved: Yes Date patient discharged from therapy: 05/27/20     Victorino Sparrow, LRT/CTRS  Ria Comment, Arilynn Blakeney A 05/27/2020, 11:30 AM

## 2020-05-27 NOTE — Progress Notes (Signed)
Pt discharged to lobby. Pt was stable and appreciative at that time. All papers and prescriptions were given and valuables returned. Verbal understanding expressed. Denies SI/HI and A/VH. Pt given opportunity to express concerns and ask questions.  

## 2020-05-27 NOTE — Plan of Care (Signed)
Problem: Education: Goal: Knowledge of [facility] Education information/materials will improve Outcome: Adequate for Discharge Goal: Emotional status will improve Outcome: Adequate for Discharge Goal: Mental status will improve Outcome: Adequate for Discharge Goal: Verbalization of understanding the information provided will improve Outcome: Adequate for Discharge   Problem: Activity: Goal: Interest or engagement in activities will improve Outcome: Adequate for Discharge Goal: Sleeping patterns will improve Outcome: Adequate for Discharge   Problem: Coping: Goal: Ability to verbalize frustrations and anger appropriately will improve Outcome: Adequate for Discharge Goal: Ability to demonstrate self-control will improve Outcome: Adequate for Discharge   Problem: Health Behavior/Discharge Planning: Goal: Identification of resources available to assist in meeting health care needs will improve Outcome: Adequate for Discharge Goal: Compliance with treatment plan for underlying cause of condition will improve Outcome: Adequate for Discharge   Problem: Physical Regulation: Goal: Ability to maintain clinical measurements within normal limits will improve Outcome: Adequate for Discharge   Problem: Safety: Goal: Periods of time without injury will increase Outcome: Adequate for Discharge   Problem: Activity: Goal: Will verbalize the importance of balancing activity with adequate rest periods Outcome: Adequate for Discharge   Problem: Education: Goal: Will be free of psychotic symptoms Outcome: Adequate for Discharge Goal: Knowledge of the prescribed therapeutic regimen will improve Outcome: Adequate for Discharge   Problem: Coping: Goal: Coping ability will improve Outcome: Adequate for Discharge Goal: Will verbalize feelings Outcome: Adequate for Discharge   Problem: Health Behavior/Discharge Planning: Goal: Compliance with prescribed medication regimen will  improve Outcome: Adequate for Discharge   Problem: Nutritional: Goal: Ability to achieve adequate nutritional intake will improve Outcome: Adequate for Discharge   Problem: Role Relationship: Goal: Ability to communicate needs accurately will improve Outcome: Adequate for Discharge Goal: Ability to interact with others will improve Outcome: Adequate for Discharge   Problem: Safety: Goal: Ability to redirect hostility and anger into socially appropriate behaviors will improve Outcome: Adequate for Discharge Goal: Ability to remain free from injury will improve Outcome: Adequate for Discharge   Problem: Self-Care: Goal: Ability to participate in self-care as condition permits will improve Outcome: Adequate for Discharge   Problem: Self-Concept: Goal: Will verbalize positive feelings about self Outcome: Adequate for Discharge   Problem: Education: Goal: Ability to make informed decisions regarding treatment will improve Outcome: Adequate for Discharge   Problem: Coping: Goal: Coping ability will improve Outcome: Adequate for Discharge   Problem: Health Behavior/Discharge Planning: Goal: Identification of resources available to assist in meeting health care needs will improve Outcome: Adequate for Discharge   Problem: Medication: Goal: Compliance with prescribed medication regimen will improve Outcome: Adequate for Discharge   Problem: Self-Concept: Goal: Ability to disclose and discuss suicidal ideas will improve Outcome: Adequate for Discharge Goal: Will verbalize positive feelings about self Outcome: Adequate for Discharge   Problem: Education: Goal: Utilization of techniques to improve thought processes will improve Outcome: Adequate for Discharge Goal: Knowledge of the prescribed therapeutic regimen will improve Outcome: Adequate for Discharge   Problem: Activity: Goal: Interest or engagement in leisure activities will improve Outcome: Adequate for  Discharge Goal: Imbalance in normal sleep/wake cycle will improve Outcome: Adequate for Discharge   Problem: Coping: Goal: Coping ability will improve Outcome: Adequate for Discharge Goal: Will verbalize feelings Outcome: Adequate for Discharge   Problem: Health Behavior/Discharge Planning: Goal: Ability to make decisions will improve Outcome: Adequate for Discharge Goal: Compliance with therapeutic regimen will improve Outcome: Adequate for Discharge   Problem: Role Relationship: Goal: Will demonstrate positive changes in  social behaviors and relationships Outcome: Adequate for Discharge   Problem: Safety: Goal: Ability to disclose and discuss suicidal ideas will improve Outcome: Adequate for Discharge Goal: Ability to identify and utilize support systems that promote safety will improve Outcome: Adequate for Discharge   Problem: Self-Concept: Goal: Will verbalize positive feelings about self Outcome: Adequate for Discharge Goal: Level of anxiety will decrease Outcome: Adequate for Discharge  Patient will discharge home per MD.  Initiate discharge paperwork for patient to review.

## 2020-05-27 NOTE — Progress Notes (Signed)
Pt discharged to lobby. Pt was stable and appreciative at that time. All papers and prescriptions were given and valuables returned. Verbal understanding expressed. Denies SI/HI and A/VH. Pt given opportunity to express concerns and ask questions. Pt appears to be in no physical distress at this time. Denies concerns at this time.

## 2020-05-27 NOTE — Progress Notes (Addendum)
Patient ID: [name] [name], female   DOB: 1976-06-13, 44 y.o.   MRN: [id number]  CSW called Afifa from [facility]. Confirmed that pt's ACT team was approved by [facility]. CSW was informed to notify pts ACT QP and Case Manager. CSW received the following contact info:  QP Ms. [name]: [phone] Case Worker [name]: [phone] Peer Support Specialist [name]: [phone]  CSW contacted Ms. [name] and left message.   CSW contacted [place] and left message.  1000: [name] returned call to CSW and acknowledged pts d/c. [name] instructions for pt are to call main office and notify when she is home.

## 2020-05-27 NOTE — Progress Notes (Signed)
Recreation Therapy Notes  Date: 7.22.21 Time: 1015 Location: 500 Hall Dayroom  Group Topic: Triggers  Goal Area(s) Addresses:  Patient will identify biggest triggers. Patient will identify how to avoid triggers. Patient will identify how to deal with triggers head on.  Behavioral Response: None  Intervention: Worksheet, pencils  Activity: Triggers.  Patients were to identify their 3 biggest triggers.  Patients were to then identify three ways in which those triggers could be avoided and three ways they can be dealt with head on.  Education: Triggers, Discharge Planning  Education Outcome: Acknowledges understanding/In group clarification offered/Needs additional education.   Clinical Observations/Feedback: Pt was responding to internal stimuli.  Pt was talking and mumbling to self.  Pt would be louder at some points than at other times.   [name], LRT/CTRS         [name], Imran Nuon A 05/27/2020 11:11 AM

## 2020-05-27 NOTE — Plan of Care (Signed)
Pt was able to engage in groups with a calm and appropriate mood at least once at completion of recreation therapy group sessions.   [name], LRT/CTRS

## 2020-05-27 NOTE — Progress Notes (Signed)
  [facility] Adult Case Management Discharge Plan :  Will you be returning to the same living situation after discharge:  Yes,  home At discharge, do you have transportation home?: No, will need taxi or uber Do you have the ability to pay for your medications: Yes,  medicaid  Release of information consent forms completed and in the chart;  Patient's signature needed at discharge.  Patient to Follow up at:  Follow-up Information    [facility], [facility] Follow up.   Why: Follow up with your ACT team.  QP Ms. [name]: [phone] Case Worker [name]: [phone] Peer Support Specialist [name]: [phone] Contact information: [address] [place] [address] [phone]               Next level of care provider has access to [facility] Link:yes  Safety Planning and Suicide Prevention discussed: Yes,  yes  Have you used any form of tobacco in the last 30 days? (Cigarettes, Smokeless Tobacco, Cigars, and/or Pipes): No  Has patient been referred to the Quitline?: N/A patient is not a smoker  Patient has been referred for addiction treatment: N/A  Erin Sons, LCSW 05/27/2020, 9:46 AM

## 2020-05-27 NOTE — [facility] Suicide Risk Assessment (Addendum)
[facility] Discharge Suicide Risk Assessment   Principal Problem: Schizophrenia, paranoid type [facility]) Discharge Diagnoses: Principal Problem:   Schizophrenia, paranoid type (HCC) Active Problems:   Schizophrenia (HCC)   Total Time spent with patient: 30 minutes  Musculoskeletal: Strength & Muscle Tone: within normal limits Gait & Station: normal Patient leans: N/A  Psychiatric Specialty Exam: Review of Systems no headache, no chest pain, no shortness of breath, no vomiting   Blood pressure 115/75, pulse (!) 127, temperature 98.2 F (36.8 C), temperature source Oral, resp. rate 18, height 4\' 10"  (1.473 m), weight 53.5 kg, last menstrual period 05/19/2020, SpO2 100 %.Body mass index is 24.66 kg/m.  General Appearance: Fairly Groomed  05/21/2020::  Good  Speech:  [phone]  Volume:  Decreased  Mood:  states her mood is "OK", denies feeling depressed   Affect:  still tends to be blunted   Thought Process:  Linear and Descriptions of Associations: Circumstantial  Orientation:  Full (Time, Place, and Person)  Thought Content:  reports decreased auditory hallucinations, and currently does not appear overtly internally preoccupied   Suicidal Thoughts:  No denies suicidal or self injurious ideations, denies homicidal or violent ideations   Homicidal Thoughts:  No  Memory:  recent and remote grossly intact   Judgement:  Fair  Insight:  Fair  Psychomotor Activity:  Normal- presents calm, no psychomotor agitation   Concentration:  improving   Recall:  [id number] of Knowledge:Fair  Language: Fair  Akathisia:  Negative  Handed:  Right  AIMS (if indicated):   some involuntary oro-buccal movements are noted   Assets:  Resilience  Sleep:  Number of Hours: 8  Cognition: WNL  ADL's:  Intact   Mental Status Per Nursing Assessment::   On Admission:  NA  Demographic Factors:  44, lives alone, has one daughter, on disability  Loss Factors: Chronic mental illness   Historical  Factors: History of schizophrenia, prior psychiatric admissions   Risk Reduction Factors:   Positive coping skills or problem solving skills  Continued Clinical Symptoms:  At this time patient presents calm, in no acute distress. Reports mood is "OK" and denies depression, affect remains blunted but has improved partially. No thought disorder, no suicidal or self injurious ideations, no homicidal or violent ideations, reports chronic auditory hallucinations, which she currently describes as improved .Currently does not appear overtly internally preoccupied . Denies SI. Denies HI.  She is oriented x 3. No disruptive or overtly agitated behaviors on unit. Denies medication side effects. Side effects have been reviewed .  She has a history of management with [id number] and oral Risperidone. She tolerates well .  Thorazine was added recently to further address chronic psychotic symptoms, and it is felt it has helped. She has denied side effects.  Follow up EKG today 7/22 QTc 456.  We have reviewed with treatment team/pharmacist/patient.  Will taper oral Risperidone to 2 mgrs BID prior to discharge, with recommendation of further oral Risperidone taper on an outpatient basis  , in order to limit use of different antipsychotic medications .  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 Have reviewed with patient- she is not distressed by or particularly aware of these movements. I have reviewed with there that there is now availability of medications to address abnormal movements induced by antipsychotics . Currently not wanting to start new medication, encouraged her to discuss with her outpatient psychiatrist .   Cognitive Features That Contribute  To Risk:  No gross cognitive deficits noted upon discharge. Is alert , attentive, and oriented x 3   Suicide Risk:  Mild:  Suicidal ideation of limited frequency, intensity, duration, and  specificity.  There are no identifiable plans, no associated intent, mild dysphoria and related symptoms, good self-control (both objective and subjective assessment), few other risk factors, and identifiable protective factors, including available and accessible social support.   Follow-up Information    [facility], [facility] Follow up.   Why: Follow up with your ACT team.  QP Ms. [name]: [phone] Case Worker Shalonda: [phone] Peer Support Specialist [name]: [phone] Contact information: [address] [place] [address] [phone]               Plan Of Care/Follow-up recommendations:  Activity:  as tolerated Diet:  regular Tests:  NA Other:  See below  Patient is discharging in good spirits , plans to return home Plans to follow up with ACT team and chart note indicates she has been accepted for follow up by [facility]   Craige Cotta, MD 05/27/2020, 12:06 PM

## 2020-05-27 NOTE — Discharge Summary (Addendum)
Physician Discharge Summary Note  Patient:  [name] [name] is an 44 y.o., female MRN:  [id number] DOB:  1976-07-21 Patient phone:  [phone] (home)  Patient address:   [address] [place] [address],  Total Time spent with patient: Greater than 30 minutes  Date of Admission:  05/19/2020  Date of Discharge: 05-27-20  Reason for Admission: Patientis an 44 y.o.female.who presented to [facility] voluntarily stating ," [name] [name] is trying to kill me. God is telling me that I am dead, talking about sex, and telling me that men are going to fuck me and dump me. She was admitted to inpatient unit for further evaluation and stabilization.  Principal Problem: Schizophrenia, paranoid type [facility]) Discharge Diagnoses: Patient Active Problem List   Diagnosis Date Noted  . Schizophrenia (HCC) [F20.9] 05/19/2020  . Schizophrenia, paranoid type (HCC) [F20.0] 03/20/2020  . Undifferentiated schizophrenia (HCC) [F20.3] 09/19/2019  . Homelessness [Z59.0] 07/06/2019  . Hallucinations [R44.3]    Past Psychiatric History:  She has past h/o schizophrenia, paranoid type.  Past Medical History:  Past Medical History:  Diagnosis Date  . Arthritis   . Depression   . Hallucinations   . Paranoid schizophrenia (HCC)   . Schizoaffective disorder (HCC)    History reviewed. No pertinent surgical history.  Family History: History reviewed. No pertinent family history.  Family Psychiatric  History:Unknown Social History:  Social History   Substance and Sexual Activity  Alcohol Use Not Currently   Comment: She denies      Social History   Substance and Sexual Activity  Drug Use Not Currently   Comment: She denies     Social History   Socioeconomic History  . Marital status: Single    Spouse name: Not on file  . Number of children: Not on file  . Years of education: Not on file  . Highest education level: Not on file  Occupational History  . Occupation: Disability  Tobacco  Use  . Smoking status: Never Smoker  . Smokeless tobacco: Never Used  Vaping Use  . Vaping Use: Never used  Substance and Sexual Activity  . Alcohol use: Not Currently    Comment: She denies   . Drug use: Not Currently    Comment: She denies   . Sexual activity: Not Currently  Other Topics Concern  . Not on file  Social History Narrative   Pt stated that she lives in [place], and that she lives alone.  Pt receives outpatient psychiatric resources through [employer].   Social Determinants of Health   Financial Resource Strain:   . Difficulty of Paying Living Expenses:   Food Insecurity:   . Worried About Programme researcher, broadcasting/film/video in the Last Year:   . Barista in the Last Year:   Transportation Needs:   . Freight forwarder (Medical):   Marland Kitchen Lack of Transportation (Non-Medical):   Physical Activity:   . Days of Exercise per Week:   . Minutes of Exercise per Session:   Stress:   . Feeling of Stress :   Social Connections:   . Frequency of Communication with Friends and Family:   . Frequency of Social Gatherings with Friends and Family:   . Attends Religious Services:   . Active Member of Clubs or Organizations:   . Attends Banker Meetings:   Marland Kitchen Marital Status:    Hospital Course: Patientis an 44 y.o.female.who presented to [facility] voluntarily stating ," [name] [name] is trying to  kill me. God is telling me that I am dead, talking about sex, and telling me that men are going to fuck me and dump me."  She reports that over the weekend, she had called the police so many times that they told her that the next time she called, they would IVC her. She states that she called her therapist numerous times yesterday telling her that she felt sick. She states around 3:00 am, God called her again and said," No guys are going to sleep with you because you are poor and I am dead." [facility]  having suicidal thoughts with a plan [place]. Patient  does have a CST team and she stated that the last time she took her medications was Monday.Shannell at [facility] was contacted to obtain collateral information regarding Ms. Cataldo treatment. Shannell stated that the patients has been compliant with taking her medications until 1 week ago when she stopped taking all her medications due to delusions.[facility]ST reported that they have referred the patient to ACT and that they would like her to be admitted for 90 days.We discussed that patient's current medications and dosages. The patient's last Invega injection 156 mg was administered on 05/03/20, and this was confirm with Kandis NabBeth, Pharm D., [facility].Ad Chestine Sporelark from his ACT also confirms that she is not homeless and they are renting a home for her.   She was started on Risperdal 3 mg BID PO daily for psychosis and intrusions along with Cogentin to prevent EPS. Her  Invega 156mg /ml IM is due on 05/31/20. Also, Sertraline 50 mg Po daily for depression, Trazodone 150 mg for sleep QHS daily. She was not doing good so her Zyprexa was changed to 20 mg PO QHS for psychosis. Same treatment was continued during the visit and helped with patient's psychosis and intrusive behavior.  Today patient reports feels better and ready to discharge. She looks very tired but was pleasant and cooperative. She denies any suicidal ideations today. She had auditory hallucinations in the morning but denies HI or visual hallucinations. She is paranoid and has fixed delusions but not aggressive and was smiling and responding sometimes, other times had restricted affect. She was counseled about her medication adherence, she confirmed her understanding.     Physical Findings: AIMS: Facial and Oral Movements Muscles of Facial Expression: None, normal Lips and Perioral Area: None, normal Jaw: None, normal Tongue: None, normal,Extremity Movements Upper (arms, wrists, hands, fingers): None, normal Lower (legs, knees, ankles, toes): None, normal,  Trunk Movements Neck, shoulders, hips: None, normal, Overall Severity Severity of abnormal movements (highest score from questions above): None, normal Incapacitation due to abnormal movements: None, normal Patient's awareness of abnormal movements (rate only patient's report): No Awareness, Dental Status Current problems with teeth and/or dentures?: No Does patient usually wear dentures?: No  CIWA:  CIWA-Ar Total: 0 COWS:  COWS Total Score: 0  Musculoskeletal: Strength & Muscle Tone: within normal limits Gait & Station: normal Patient leans: N/A  Psychiatric Specialty Exam: Physical Exam Vitals and nursing note reviewed.  HENT:     Head: Normocephalic.     Nose: Nose normal.     Mouth/Throat:     Pharynx: Oropharynx is clear.  Eyes:     Pupils: Pupils are equal, round, and reactive to light.  Cardiovascular:     Rate and Rhythm: Normal rate.     Pulses: Normal pulses.  Pulmonary:     Effort: Pulmonary effort is normal.  Genitourinary:  Comments: Deferred Musculoskeletal:        General: Normal range of motion.     Cervical back: Normal range of motion.  Skin:    General: Skin is warm and dry.  Neurological:     Mental Status: She is alert and oriented to person, place, and time.     Review of Systems  Constitutional: Negative for chills, diaphoresis and fever.  HENT: Negative for congestion and sore throat.   Eyes: Negative for blurred vision.  Respiratory: Negative for cough, shortness of breath and wheezing.   Cardiovascular: Negative for chest pain and palpitations.  Gastrointestinal: Negative for abdominal pain, diarrhea, heartburn, nausea and vomiting.  Genitourinary: Negative for dysuria.  Musculoskeletal: Negative for joint pain and myalgias.  Skin: Negative for itching and rash.  Neurological: Negative for dizziness, tremors, sensory change, speech change, focal weakness, seizures, loss of consciousness and weakness.  Endo/Heme/Allergies: Negative for  environmental allergies.       Allergies: NKDA  Psychiatric/Behavioral: Positive for depression (Stabilized with medication prirot o discharge) and hallucinations (Hx. psychosis (Stabilized with medication prior to discharge). Negative for memory loss, substance abuse and suicidal ideas. The patient has insomnia (Stabilized with medication prior to discharge). The patient is not nervous/anxious (Stable).     Blood pressure 115/75, pulse (!) 127, temperature 98.2 F (36.8 C), temperature source Oral, resp. rate 18, height 4\' 10"  (1.473 m), weight 53.5 kg, last menstrual period 05/19/2020, SpO2 100 %.Body mass index is 24.66 kg/m.  See Md's discharge SRA  Sleep:  Number of Hours: 8   Have you used any form of tobacco in the last 30 days? (Cigarettes, Smokeless Tobacco, Cigars, and/or Pipes): No  Has this patient used any form of tobacco in the last 30 days? (Cigarettes, Smokeless Tobacco, Cigars, and/or Pipes): Yes, an FDA-approved tobacco cessation medication was recommended at discharge.  Blood Alcohol level:  Lab Results  Component Value Date   ETH <10 05/19/2020   ETH <10 05/11/2020   Metabolic Disorder Labs:  Lab Results  Component Value Date   HGBA1C 5.0 05/19/2020   MPG 96.8 05/19/2020   MPG 91.06 08/06/2019   Lab Results  Component Value Date   PROLACTIN 28.9 (H) 06/26/2018   Lab Results  Component Value Date   CHOL 181 05/19/2020   TRIG 83 05/19/2020   HDL 43 05/19/2020   CHOLHDL 4.2 05/19/2020   VLDL 17 05/19/2020   LDLCALC 121 (H) 05/19/2020   LDLCALC 104 (H) 09/19/2019   See Psychiatric Specialty Exam and Suicide Risk Assessment completed by Attending Physician prior to discharge.  Discharge destination:  Home  Is patient on multiple antipsychotic therapies at discharge:  Yes,   Do you recommend tapering to monotherapy for antipsychotics?  Yes   Has Patient had three or more failed trials of antipsychotic monotherapy by history:  Yes,   Antipsychotic  medications that previously failed include:   1.  Olanzapine., 2.  Risperdal. and 3.  Invega.  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  Allergies as of 05/27/2020   No Known Allergies     Medication List    STOP taking these medications   Invega Sustenna 156 MG/ML Susy injection Generic drug: paliperidone Replaced by: paliperidone 234 MG/1.5ML Susy injection   OLANZapine 15 MG tablet Commonly known as: ZYPREXA   risperidone 4 MG tablet Commonly known as: RISPERDAL Replaced by: risperiDONE 2 MG disintegrating tablet  TAKE these medications     Indication  benztropine 1 MG tablet Commonly known as: COGENTIN Take 1 tablet (1 mg total) by mouth 2 (two) times daily. For prevention of drug induced tremors What changed: additional instructions  Indication: Extrapyramidal Reaction caused by Medications   chlorproMAZINE 100 MG tablet Commonly known as: THORAZINE Take 1 tablet (100 mg total) by mouth 3 (three) times daily. For mood control  Indication: Mood control   paliperidone 234 MG/1.5ML Susy injection Commonly known as: INVEGA SUSTENNA Inject 234 mg into the muscle every 28 (twenty-eight) days. (Due on 05-31-20): For mood control Start taking on: May 31, 2020 Replaces: Invega Sustenna 156 MG/ML Susy injection  Indication: Schizophrenia   risperiDONE 2 MG disintegrating tablet Commonly known as: RISPERDAL M-TABS Take 1 tablet (2 mg total) by mouth 2 (two) times daily. For mood control Replaces: risperidone 4 MG tablet  Indication: Mood control   sertraline 50 MG tablet Commonly known as: ZOLOFT Take 1 tablet (50 mg total) by mouth daily. For depression What changed: additional instructions  Indication: Major Depressive Disorder   traZODone 150 MG tablet Commonly known as: DESYREL Take 1 tablet  (150 mg total) by mouth at bedtime. For sleep What changed:   when to take this  reasons to take this  additional instructions  Indication: Trouble Sleeping       Follow-up Information    [facility], [facility] Follow up.   Why: Follow up with your ACT team.  QP Ms. [name]: [phone] Case Worker Shalonda: [phone] Peer Support Specialist [name]: [phone] Contact information: [address] [place] [address] [phone]              Follow-up recommendations: Activity:  As tolerated Diet: As recommended by your primary care doctor. Keep all scheduled follow-up appointments as recommended.   Comments: Prescriptions given at discharge.  Patient agreeable to plan.  Given opportunity to ask questions.  Appears to feel comfortable with discharge denies any current suicidal or homicidal thought. Patient is also instructed prior to discharge to: Take all medications as prescribed by his/her mental healthcare provider. Report any adverse effects and or reactions from the medicines to his/her outpatient provider promptly. Patient has been instructed & cautioned: To not engage in alcohol and or illegal drug use while on prescription medicines. In the event of worsening symptoms, patient is instructed to call the crisis hotline, 911 and or go to the nearest ED for appropriate evaluation and treatment of symptoms. To follow-up with his/her primary care provider for your other medical issues, concerns and or health care needs.    Signed: Arnoldo Lenis, MD,  05/27/2020, 2:51 PM Patient seen, Suicide Assessment Completed.  Disposition Plan Reviewed

## 2020-05-29 ENCOUNTER — Ambulatory Visit (HOSPITAL_COMMUNITY)
Admission: EM | Admit: 2020-05-29 | Discharge: 2020-05-30 | Disposition: A | Discharge status: Home / Self Care | Payer: Medicare Other | Attending: Family | Admitting: Family

## 2020-05-29 ENCOUNTER — Encounter (HOSPITAL_COMMUNITY): Payer: Self-pay | Admitting: Emergency Medicine

## 2020-05-29 ENCOUNTER — Other Ambulatory Visit: Payer: Self-pay

## 2020-05-29 DIAGNOSIS — R45851 Suicidal ideations: Secondary | ICD-10-CM | POA: Insufficient documentation

## 2020-05-29 DIAGNOSIS — R45850 Homicidal ideations: Secondary | ICD-10-CM | POA: Insufficient documentation

## 2020-05-29 DIAGNOSIS — F329 Major depressive disorder, single episode, unspecified: Secondary | ICD-10-CM | POA: Diagnosis not present

## 2020-05-29 DIAGNOSIS — F200 Paranoid schizophrenia: Secondary | ICD-10-CM | POA: Diagnosis not present

## 2020-05-29 DIAGNOSIS — Z79899 Other long term (current) drug therapy: Secondary | ICD-10-CM | POA: Insufficient documentation

## 2020-05-29 LAB — POC SARS CORONAVIRUS 2 AG -  ED: SARS Coronavirus 2 Ag: NEGATIVE

## 2020-05-29 MED ORDER — PALIPERIDONE PALMITATE ER 234 MG/1.5ML IM SUSY: 234.0000 mg | PREFILLED_SYRINGE | INTRAMUSCULAR | Status: DC

## 2020-05-29 MED ORDER — ACETAMINOPHEN 325 MG PO TABS: 650.0000 mg | ORAL_TABLET | Freq: Four times a day (QID) | ORAL | Status: DC | PRN

## 2020-05-29 MED ORDER — BENZTROPINE MESYLATE 1 MG PO TABS
1.0000 mg | ORAL_TABLET | Freq: Two times a day (BID) | ORAL | Status: DC
  Administered 2020-05-29 – 2020-05-30 (×2): 1 mg via ORAL
  Filled 2020-05-29: qty 1
  Filled 2020-05-29: qty 2
  Filled 2020-05-29: qty 1

## 2020-05-29 MED ORDER — CHLORPROMAZINE HCL 50 MG PO TABS
100.0000 mg | ORAL_TABLET | Freq: Three times a day (TID) | ORAL | Status: DC
  Administered 2020-05-29 – 2020-05-30 (×3): 100 mg via ORAL
  Filled 2020-05-29: qty 6
  Filled 2020-05-29: qty 4
  Filled 2020-05-29 (×2): qty 6
  Filled 2020-05-29 (×2): qty 4

## 2020-05-29 MED ORDER — SERTRALINE HCL 50 MG PO TABS
50.0000 mg | ORAL_TABLET | Freq: Every day | ORAL | Status: DC
  Administered 2020-05-29 – 2020-05-30 (×2): 50 mg via ORAL
  Filled 2020-05-29 (×3): qty 1

## 2020-05-29 MED ORDER — ALUM & MAG HYDROXIDE-SIMETH 200-200-20 MG/5ML PO SUSP: 30.0000 mL | ORAL | Status: DC | PRN

## 2020-05-29 MED ORDER — TRAZODONE HCL 150 MG PO TABS
150.0000 mg | ORAL_TABLET | Freq: Every day | ORAL | Status: DC
  Administered 2020-05-29: 150 mg via ORAL
  Filled 2020-05-29 (×2): qty 1

## 2020-05-29 MED ORDER — PALIPERIDONE PALMITATE ER 234 MG/1.5ML IM SUSY
234.0000 mg | PREFILLED_SYRINGE | INTRAMUSCULAR | Status: DC
  Administered 2020-05-30: 234 mg via INTRAMUSCULAR
  Filled 2020-05-29: qty 1.5

## 2020-05-29 MED ORDER — MAGNESIUM HYDROXIDE 400 MG/5ML PO SUSP: 30.0000 mL | Freq: Every day | ORAL | Status: DC | PRN

## 2020-05-29 MED ORDER — RISPERIDONE 2 MG PO TBDP
2.0000 mg | ORAL_TABLET | Freq: Two times a day (BID) | ORAL | Status: DC
  Administered 2020-05-29 – 2020-05-30 (×2): 2 mg via ORAL
  Filled 2020-05-29: qty 2
  Filled 2020-05-29 (×2): qty 1

## 2020-05-29 NOTE — ED Notes (Signed)
Pt sleeping at present, no distress noted, resting comfortably.  Monitoring for safety. 

## 2020-05-29 NOTE — ED Notes (Signed)
Pt with history of Paranoid Schizophrenia, presents with SI, HI & AVH, stating she needs help.  Pt very paranoid and guarded, stating ," The woman is dirty, I prefer not to have bloodwork drawn at this time."  Pt refused labs, skin search completed,  Pt calm & cooperative at present.  Denies SI, HI at present.  Comfort measures given.  Meal given.  Monitoring for safety.  No distress noted.

## 2020-05-29 NOTE — ED Notes (Signed)
Patient belongings in locker 27 

## 2020-05-29 NOTE — BH Assessment (Signed)
Comprehensive Clinical Assessment (CCA) Screening, Triage and Referral Note  05/29/2020 [name] [name] [id number]  [name] 44 y.o. who presented to [facility] voluntarily stating "[name] [name] is trying to kill me.  Pt currently SI, HI, AVH and SIB but per chart history has a history of Schizophrenia and has had multiple hospital visits. Upon doing TTS assessment pt was mumbling and talking to herself and seemed to be preoccupied. Pt states, " I just know I need help". Pt was recently discharged from [facility] on 05/27/20 for similar presentation. Pt reports she has an ACTT team now through [facility] but has not adhered to taking all her medications, pt states she only has 2 out of the 5 medications she is supposed to be taking. Pt reports she has been sleeping last few weeks but appetite has been poor, and she denies symptoms of depression. Pt speech coherent at times but pt actively psychotic and preoccupied as well as hallucinating. Pt states she would like to bring her book with her God told her to study for an Art exam she has coming up, she states she attends [employer]. At this time does not feel safe to leave although she denies SI,HI pt feels she needs additional help.  Diagnosis: Schizophrenia   Disposition: Takia-Starkes-Perry, DNP, recommends pt for continual assessment at [facility], discharge tomorrow to her ACT team when social work is available.    Visit Diagnosis: Schizophrenia  Patient Reported Information How did you hear about [place]? Legal System   Referral name: Self   Referral phone number: No data recorded Whom do you see for routine medical problems? I don't have a doctor   Practice/Facility Name: [facility]   Practice/Facility Phone Number: No data recorded  Name of Contact: No data recorded  Contact Number: No data recorded  Contact Fax Number: No data recorded  Prescriber Name: No data recorded  Prescriber Address (if known): No data recorded What Is  the Reason for Your Visit/Call Today? paranoia  How Long Has This Been Causing You Problems? > than 6 months  Have You Recently Been in Any Inpatient Treatment (Hospital/Detox/Crisis Center/28-Day Program)? Yes   Name/Location of Program/Hospital:[facility]   How Long Were You There? )05/19/20--- 7/21  When Were You Discharged? 05/28/20  Have You Ever Received Services From [employer] [facility] Before? Yes   Who Do You See at [facility]? Psychatrist  Have You Recently Had Any Thoughts About Hurting Yourself? No   Are You Planning to Commit Suicide/Harm Yourself At This time?  No  Have you Recently Had Thoughts About Hurting Someone [name]? No   Explanation: No data recorded Have You Used Any Alcohol or Drugs in the Past 24 Hours? No   How Long Ago Did You Use Drugs or Alcohol?  No data recorded  What Did You Use and How Much? No data recorded What Do You Feel Would Help You the Most Today? Medication  Do You Currently Have a Therapist/Psychiatrist? Yes   Name of Therapist/Psychiatrist: ACTT Team ( [facility])  Have You Been Recently Discharged From Any Public relations account executive or Programs? No   Explanation of Discharge From Practice/Program:  No data recorded    CCA Screening Triage Referral Assessment Type of Contact: Face-to-Face   Is this Initial or Reassessment? Initial Assessment   Date Telepsych consult ordered in [facility]:  05/29/20   Time Telepsych consult ordered in [facility]:  1928  Patient Reported Information Reviewed? Yes   Patient Left Without Being Seen?  No data recorded  Reason for Not Completing Assessment:  Collateral Involvement: none  Does Patient Have a Court Appointed Legal Guardian? No data recorded  Name and Contact of Legal Guardian:  self  If Minor and Not Living with Parent(s), Who has Custody? n/a  Is CPS involved or ever been involved? Never  Is APS involved or ever been involved? Never  Patient Determined To Be At Risk for Harm To Self or Others  Based on Review of Patient Reported Information or Presenting Complaint? No   Method: No data recorded  Availability of Means: No data recorded  Intent: No data recorded  Notification Required: No data recorded  Additional Information for Danger to Others Potential:  No data recorded  Additional Comments for Danger to Others Potential:  No data recorded  Are There Guns or Other Weapons in Your Home?  No data recorded   Types of Guns/Weapons: No data recorded   Are These Weapons Safely Secured?                              No data recorded   Who Could Verify You Are Able To Have These Secured:    No data recorded Do You Have any Outstanding Charges, Pending Court Dates, Parole/Probation? No data recorded Contacted To Inform of Risk of Harm To Self or Others: No data recorded Location of Assessment: [facility]  Does Patient Present under Involuntary Commitment? No   IVC Papers Initial File Date: No data recorded  [place] of Residence: [place]  Patient Currently Receiving the Following Services: ACTT Psychologist, educational)   Determination of Need: Urgent (48 hours)   Options For Referral: Medication Management;BH Urgent Care   Natasha Mead, LCSWA

## 2020-05-29 NOTE — ED Provider Notes (Signed)
Behavioral Health Admission H&P [facility] & OBS)  Date: 05/29/20 Patient Name: [name] [name] MRN: [id number] Chief Complaint: No chief complaint on file.     Diagnoses:  Final diagnoses:  None    HPI: [name] [name] 44 y.o. who presented to [facility] voluntarily stating "[name] is trying to kill me. God and my sister are after me." She states she was recently discharged from [facility] on 05/28/2020, this was verified by chart review. She was stepped up to an ACTT from CST during this admission. She states she does not feel safe and needs to come in for a little while. SHe also reports her immediate decompensation comes from not getting all her medications from the pharmacy, although this is unclear as she has retrieved all her medications. She denies suicidal ideation and homicidal ideations. While typing this note patient is her mumbling to self and then talking and chanting in a loud voice " MY ass is dying. My ass is dying. " Then she proceeds to have an argument with her self that she is not dying and "God trying to her. "   PHQ 2-9:    ED from 05/11/2020 in [facility] EMERGENCY DEPARTMENT  Thoughts that you would be better off dead, or of hurting yourself in some way More than half the days  PHQ-9 Total Score 10        [facility] Most recent reading at 05/19/2020  9:45 PM ED from 05/19/2020 in [facility] Most recent reading at 05/19/2020 12:30 PM ED from 05/11/2020 in [facility] EMERGENCY DEPARTMENT Most recent reading at 05/12/2020  6:01 AM  C-SSRS RISK CATEGORY Error: Q7 should not be populated when Q6 is No Error: Q7 should not be populated when Q6 is No Error: Q3, 4, or 5 should not be populated when Q2 is No       Total Time spent with patient: 15 minutes  Musculoskeletal  Strength & Muscle Tone: within normal limits Gait & Station: normal Patient  leans: N/A  Psychiatric Specialty Exam  Presentation General Appearance: Appropriate for Environment;Casual  Eye Contact:Poor  Speech:Clear and Coherent;Normal Rate  Speech Volume:Decreased  Handedness:Right   Mood and Affect  Mood:Dysphoric  Affect:Depressed;Flat   Thought Process  Thought Processes:Coherent;Linear  Descriptions of Associations:Intact  Orientation:Full (Time, Place and Person)  Thought Content:Delusions;Perseveration  Hallucinations:Hallucinations: Command Description of Command Hallucinations: God and my sister are telling me to hurt myself. He messes with me real bad when I dont have my medicine  Ideas of Reference:Paranoia;Delusions  Suicidal Thoughts:Suicidal Thoughts: No  Homicidal Thoughts:Homicidal Thoughts: No   Sensorium  Memory:Immediate Fair;Recent Fair;Remote Fair  Judgment:Fair  Insight:Fair   Executive Functions  Concentration:Fair  Attention Span:Fair  Recall:Fair  Fund of Knowledge:Fair  Language:Fair   Psychomotor Activity  Psychomotor Activity:Psychomotor Activity: Normal   Assets  Assets:Communication Skills;Desire for Improvement;Financial Resources/Insurance;Housing;Physical Health;Social Support;Resilience;Other (comment) (ACTT)   Sleep  Sleep:Sleep: Fair   Physical Exam ROS  Blood pressure 117/79, pulse 98, temperature 97.7 F (36.5 C), temperature source Temporal, resp. rate 18, height  (1.473 m), weight 121 lb (54.9 kg), last menstrual period 05/19/2020, SpO2 100 %. Body mass index is 25.29 kg/m.  Past Psychiatric History: Schizophrenia, numerous ED visits, previous hospitalizations. Recently discharged from [facility] on 05/28/2020 (LOS 10 days ). She was released to her ACTT team.   Is the patient at risk to self? Yes  Has  the patient been a risk to self in the past 6 months? Yes .    Has the patient been a risk to self within the distant past? No   Is the patient a risk  to others? No   Has the patient been a risk to others in the past 6 months? No   Has the patient been a risk to others within the distant past? No   Past Medical History:  Past Medical History:  Diagnosis Date  . Arthritis   . Depression   . Hallucinations   . Paranoid schizophrenia (HCC)   . Schizoaffective disorder (HCC)    No past surgical history on file.  Family History: No family history on file.  Social History:  Social History   Socioeconomic History  . Marital status: Single    Spouse name: Not on file  . Number of children: Not on file  . Years of education: Not on file  . Highest education level: Not on file  Occupational History  . Occupation: Disability  Tobacco Use  . Smoking status: Never Smoker  . Smokeless tobacco: Never Used  Vaping Use  . Vaping Use: Never used  Substance and Sexual Activity  . Alcohol use: Not Currently    Comment: She denies   . Drug use: Not Currently    Comment: She denies   . Sexual activity: Not Currently  Other Topics Concern  . Not on file  Social History Narrative   Pt stated that [place] in [place], and that she lives alone.  Pt receives outpatient psychiatric resources through [facility].   Social Determinants of Health   Financial Resource Strain:   . Difficulty of Paying Living Expenses:   Food Insecurity:   . Worried About Programme researcher, broadcasting/film/videounning Out of Food in the Last Year:   . Baristaan Out of Food in the Last Year:   Transportation Needs:   . Freight forwarderLack of Transportation (Medical):   Marland Kitchen. Lack of Transportation (Non-Medical):   Physical Activity:   . Days of Exercise per Week:   . Minutes of Exercise per Session:   Stress:   . Feeling of Stress :   Social Connections:   . Frequency of Communication with Friends and Family:   . Frequency of Social Gatherings with Friends and Family:   . Attends Religious Services:   . Active Member of Clubs or Organizations:   . Attends BankerClub or Organization Meetings:   Marland Kitchen. Marital Status:    Intimate Partner Violence:   . Fear of Current or Ex-Partner:   . Emotionally Abused:   Marland Kitchen. Physically Abused:   . Sexually Abused:     SDOH:  SDOH Screenings   Alcohol Screen: Low Risk   . Last Alcohol Screening Score (AUDIT): 0  Depression (PHQ2-9): Medium Risk  . PHQ-2 Score: 10  Financial Resource Strain:   . Difficulty of Paying Living Expenses:   Food Insecurity:   . Worried About Programme researcher, broadcasting/film/videounning Out of Food in the Last Year:   . [employer] P[employer] Financialan Out of Food in the Last Year:   Housing:   . Last Housing Risk Score:   Physical Activity:   . Days of Exercise per Week:   . Minutes of Exercise per Session:   Social Connections:   . Frequency of Communication with Friends and Family:   . Frequency of Social Gatherings with Friends and Family:   . Attends Religious Services:   . Active Member of Clubs or Organizations:   . Attends Club  or Organization Meetings:   Marland Kitchen Marital Status:   Stress:   . Feeling of Stress :   Tobacco Use: Low Risk   . Smoking Tobacco Use: Never Smoker  . Smokeless Tobacco Use: Never Used  Transportation Needs:   . Freight forwarder (Medical):   Marland Kitchen Lack of Transportation (Non-Medical):     Last Labs:  Admission on 05/19/2020, Discharged on 05/19/2020  Component Date Value Ref Range Status  . SARS Coronavirus 2 05/19/2020 NEGATIVE  NEGATIVE Final   Comment: (NOTE) SARS-CoV-2 target nucleic acids are NOT DETECTED.  The SARS-CoV-2 RNA is generally detectable in upper and lower respiratory specimens during the acute phase of infection. The lowest concentration of SARS-CoV-2 viral copies this assay can detect is 250 copies / mL. A negative result does not preclude SARS-CoV-2 infection and should not be used as the sole basis for treatment or other patient management decisions.  A negative result may occur with improper specimen collection / handling, submission of specimen other than nasopharyngeal swab, presence of viral mutation(s) within the areas targeted  by this assay, and inadequate number of viral copies (<250 copies / mL). A negative result must be combined with clinical observations, patient history, and epidemiological information.  Fact Sheet for Patients:   BoilerBrush.com.cy  Fact Sheet for Healthcare Providers: https://pope.com/  This test is not yet approved or                           cleared by the [place] FDA and has been authorized for detection and/or diagnosis of SARS-CoV-2 by FDA under an Emergency Use Authorization (EUA).  This EUA will remain in effect (meaning this test can be used) for the duration of the COVID-19 declaration under Section 564(b)(1) of the Act, 21 U.S.C. section 360bbb-3(b)(1), unless the authorization is terminated or revoked sooner.  Performed at [facility], [address] [place], [place] [address]   . WBC 05/19/2020 3.6* 4.0 - 10.5 K/uL Final  . RBC 05/19/2020 4.20  3.87 - 5.11 MIL/uL Final  . Hemoglobin 05/19/2020 11.9* 12.0 - 15.0 g/dL Final  . HCT 60/45/4098 37.3  36 - 46 % Final  . MCV 05/19/2020 88.8  80.0 - 100.0 fL Final  . MCH 05/19/2020 28.3  26.0 - 34.0 pg Final  . MCHC 05/19/2020 31.9  30.0 - 36.0 g/dL Final  . RDW 11/91/4782 13.6  11.5 - 15.5 % Final  . Platelets 05/19/2020 314  150 - 400 K/uL Final  . nRBC 05/19/2020 0.0  0.0 - 0.2 % Final  . Neutrophils Relative % 05/19/2020 54  % Final  . Neutro Abs 05/19/2020 2.0  1.7 - 7.7 K/uL Final  . Lymphocytes Relative 05/19/2020 35  % Final  . Lymphs Abs 05/19/2020 1.3  0.7 - 4.0 K/uL Final  . Monocytes Relative 05/19/2020 9  % Final  . Monocytes Absolute 05/19/2020 0.3  0 - 1 K/uL Final  . Eosinophils Relative 05/19/2020 1  % Final  . Eosinophils Absolute 05/19/2020 0.0  0 - 0 K/uL Final  . Basophils Relative 05/19/2020 1  % Final  . Basophils Absolute 05/19/2020 0.0  0 - 0 K/uL Final  . Immature Granulocytes 05/19/2020 0  % Final  . Abs Immature Granulocytes  05/19/2020 0.01  0.00 - 0.07 K/uL Final   Performed at [facility], [address], [place], [place] [address]  . Sodium 05/19/2020 139  135 - 145 mmol/L Final  .  Potassium 05/19/2020 3.7  3.5 - 5.1 mmol/L Final  . Chloride 05/19/2020 102  98 - 111 mmol/L Final  . CO2 05/19/2020 26  22 - 32 mmol/L Final  . Glucose, Bld 05/19/2020 79  70 - 99 mg/dL Final   Glucose reference range applies only to samples taken after fasting for at least 8 hours.  . BUN 05/19/2020 10  6 - 20 mg/dL Final  . Creatinine, Ser 05/19/2020 0.53  0.44 - 1.00 mg/dL Final  . Calcium 16/08/9603 9.7  8.9 - 10.3 mg/dL Final  . Total Protein 05/19/2020 7.3  6.5 - 8.1 g/dL Final  . Albumin 54/07/8118 4.2  3.5 - 5.0 g/dL Final  . AST 14/78/2956 20  15 - 41 U/L Final  . ALT 05/19/2020 14  0 - 44 U/L Final  . Alkaline Phosphatase 05/19/2020 44  38 - 126 U/L Final  . Total Bilirubin 05/19/2020 0.8  0.3 - 1.2 mg/dL Final  . GFR calc non Af Amer 05/19/2020 >60  >60 mL/min Final  . GFR calc Af Amer 05/19/2020 >60  >60 mL/min Final  . Anion gap 05/19/2020 11  5 - 15 Final   Performed at [facility], [address], [place], [place] [address]  . Alcohol, Ethyl (B) 05/19/2020 <10  <10 mg/dL Final   Comment: (NOTE) Lowest detectable limit for serum alcohol is 10 mg/dL.  For medical purposes only. Performed at [facility], [address] [place], [place] [address]   . Cholesterol 05/19/2020 181  0 - 200 mg/dL Final  . Triglycerides 05/19/2020 83  <150 mg/dL Final  . HDL 69/62/9528 43  >40 mg/dL Final  . Total CHOL/HDL Ratio 05/19/2020 4.2  RATIO Final  . VLDL 05/19/2020 17  0 - 40 mg/dL Final  . LDL Cholesterol 05/19/2020 121* 0 - 99 mg/dL Final   Comment:        Total Cholesterol/HDL:CHD Risk Coronary Heart Disease Risk Table                     Men   Women  1/2 Average Risk   3.4   3.3  Average Risk       5.0   4.4  2 X Average Risk   9.6   7.1  3 X Average Risk  23.4   11.0        Use the  calculated Patient Ratio above and the CHD Risk Table to determine the patient's CHD Risk.        ATP III CLASSIFICATION (LDL):  <100     mg/dL   Optimal  413-244  mg/dL   Near or Above                    Optimal  130-159  mg/dL   Borderline  010-272  mg/dL   High  >536     mg/dL   Very High Performed at [facility], [address], [place], [place] [address]   . TSH 05/19/2020 0.942  0.350 - 4.500 uIU/mL Final   Comment: Performed by a 3rd Generation assay with a functional sensitivity of <=0.01 uIU/mL. Performed at [facility], [address] [place], [place] [address]   . Hgb A1c MFr Bld 05/19/2020 5.0  4.8 - 5.6 % Final   Comment: (NOTE) Pre diabetes:          5.7%-6.4%  Diabetes:              >  6.4%  Glycemic control for   <7.0% adults with diabetes   . Mean Plasma Glucose 05/19/2020 96.8  mg/dL Final   Performed at [facility], [address], [place], [place] [address]  . POC Amphetamine UR 05/19/2020 None Detected  None Detected Final  . POC Secobarbital (BAR) 05/19/2020 None Detected  None Detected Final  . POC Buprenorphine (BUP) 05/19/2020 None Detected  None Detected Final  . POC Oxazepam (BZO) 05/19/2020 None Detected  None Detected Final  . POC Cocaine UR 05/19/2020 None Detected  None Detected Final  . POC Methamphetamine UR 05/19/2020 None Detected  None Detected Final  . POC Morphine 05/19/2020 None Detected  None Detected Final  . POC Oxycodone UR 05/19/2020 None Detected  None Detected Final  . POC Methadone UR 05/19/2020 None Detected  None Detected Final  . POC Marijuana UR 05/19/2020 None Detected  None Detected Final  . SARS Coronavirus 2 Ag 05/19/2020 NEGATIVE  NEGATIVE Final   Comment: (NOTE) SARS-CoV-2 antigen NOT DETECTED.   Negative results are presumptive.  Negative results do not preclude SARS-CoV-2 infection and should not be used as the sole basis for treatment or other patient management decisions, including infection   control decisions, particularly in the presence of clinical signs and  symptoms consistent with COVID-19, or in those who have been in contact with the virus.  Negative results must be combined with clinical observations, patient history, and epidemiological information. The expected result is Negative.  Fact Sheet for Patients: https://sanders-williams.net/  Fact Sheet for Healthcare Providers: https://martinez.com/   This test is not yet approved or cleared by the [place] FDA and  has been authorized for detection and/or diagnosis of SARS-CoV-2 by FDA under an Emergency Use Authorization (EUA).  This EUA will remain in effect (meaning this test can be used) for the duration of  the C                          OVID-19 declaration under Section 564(b)(1) of the Act, 21 U.S.C. section 360bbb-3(b)(1), unless the authorization is terminated or revoked sooner.    . Preg Test, Ur 05/19/2020 NEGATIVE  NEGATIVE Final   Comment:        THE SENSITIVITY OF THIS METHODOLOGY IS >24 mIU/mL   Admission on 05/11/2020, Discharged on 05/12/2020  Component Date Value Ref Range Status  . Sodium 05/11/2020 137  135 - 145 mmol/L Final  . Potassium 05/11/2020 3.4* 3.5 - 5.1 mmol/L Final  . Chloride 05/11/2020 103  98 - 111 mmol/L Final  . CO2 05/11/2020 24  22 - 32 mmol/L Final  . Glucose, Bld 05/11/2020 82  70 - 99 mg/dL Final   Glucose reference range applies only to samples taken after fasting for at least 8 hours.  . BUN 05/11/2020 14  6 - 20 mg/dL Final  . Creatinine, Ser 05/11/2020 0.71  0.44 - 1.00 mg/dL Final  . Calcium 47/82/9562 9.4  8.9 - 10.3 mg/dL Final  . Total Protein 05/11/2020 6.9  6.5 - 8.1 g/dL Final  . Albumin 13/06/6577 3.9  3.5 - 5.0 g/dL Final  . AST 46/96/2952 19  15 - 41 U/L Final  . ALT 05/11/2020 15  0 - 44 U/L Final  . Alkaline Phosphatase 05/11/2020 42  38 - 126 U/L Final  . Total Bilirubin 05/11/2020 0.3  0.3 - 1.2 mg/dL Final   . GFR calc non Af Amer 05/11/2020 >60  >60 mL/min Final  .  GFR calc Af Amer 05/11/2020 >60  >60 mL/min Final  . Anion gap 05/11/2020 10  5 - 15 Final   Performed at [facility], [address] [place], [place] [address]  . Alcohol, Ethyl (B) 05/11/2020 <10  <10 mg/dL Final   Comment: (NOTE) Lowest detectable limit for serum alcohol is 10 mg/dL.  For medical purposes only. Performed at [facility], [address] [place], [place] [address]   . Salicylate Lvl 05/11/2020 <7.0* 7.0 - 30.0 mg/dL Final   Performed at [facility], [address] [place], [place] [address]  . Acetaminophen (Tylenol), Serum 05/11/2020 <10* 10 - 30 ug/mL Final   Comment: (NOTE) Therapeutic concentrations vary significantly. A range of 10-30 ug/mL  may be an effective concentration for many patients. However, some  are best treated at concentrations outside of this range. Acetaminophen concentrations >150 ug/mL at 4 hours after ingestion  and >50 ug/mL at 12 hours after ingestion are often associated with  toxic reactions.  Performed at [facility], [address] [place], [place] [address]   . WBC 05/11/2020 5.2  4.0 - 10.5 K/uL Final  . RBC 05/11/2020 3.92  3.87 - 5.11 MIL/uL Final  . Hemoglobin 05/11/2020 11.0* 12.0 - 15.0 g/dL Final  . HCT 29/56/2130 34.6* 36 - 46 % Final  . MCV 05/11/2020 88.3  80.0 - 100.0 fL Final  . MCH 05/11/2020 28.1  26.0 - 34.0 pg Final  . MCHC 05/11/2020 31.8  30.0 - 36.0 g/dL Final  . RDW 86/57/8469 13.6  11.5 - 15.5 % Final  . Platelets 05/11/2020 278  150 - 400 K/uL Final  . nRBC 05/11/2020 0.0  0.0 - 0.2 % Final   Performed at [facility], [address] [place], [place] [address]  . Opiates 05/11/2020 NONE DETECTED  NONE DETECTED Final  . Cocaine 05/11/2020 NONE DETECTED  NONE DETECTED Final  . Benzodiazepines 05/11/2020 NONE DETECTED  NONE DETECTED Final  . Amphetamines 05/11/2020 NONE DETECTED  NONE DETECTED Final  .  Tetrahydrocannabinol 05/11/2020 NONE DETECTED  NONE DETECTED Final  . Barbiturates 05/11/2020 NONE DETECTED  NONE DETECTED Final   Comment: (NOTE) DRUG SCREEN FOR MEDICAL PURPOSES ONLY.  IF CONFIRMATION IS NEEDED FOR ANY PURPOSE, NOTIFY LAB WITHIN 5 DAYS.  LOWEST DETECTABLE LIMITS FOR URINE DRUG SCREEN Drug Class                     Cutoff (ng/mL) Amphetamine and metabolites    1000 Barbiturate and metabolites    200 Benzodiazepine                 200 Tricyclics and metabolites     300 Opiates and metabolites        300 Cocaine and metabolites        300 THC                            50 Performed at [facility], [address] [place], [place] [address]   . I-stat hCG, quantitative 05/11/2020 <5.0  <5 mIU/mL Final  . Comment 3 05/11/2020          Final   Comment:   GEST. AGE      CONC.  (mIU/mL)   <=1 WEEK        5 - 50     2 WEEKS  50 - 500     3 WEEKS       100 - 10,000     4 WEEKS     1,000 - 30,000        FEMALE AND NON-PREGNANT FEMALE:     LESS THAN 5 mIU/mL   Admission on 05/08/2020, Discharged on 05/09/2020  Component Date Value Ref Range Status  . Sodium 05/08/2020 137  135 - 145 mmol/L Final  . Potassium 05/08/2020 3.6  3.5 - 5.1 mmol/L Final  . Chloride 05/08/2020 103  98 - 111 mmol/L Final  . CO2 05/08/2020 25  22 - 32 mmol/L Final  . Glucose, Bld 05/08/2020 85  70 - 99 mg/dL Final   Glucose reference range applies only to samples taken after fasting for at least 8 hours.  . BUN 05/08/2020 10  6 - 20 mg/dL Final  . Creatinine, Ser 05/08/2020 0.57  0.44 - 1.00 mg/dL Final  . Calcium 40/98/1191 10.1  8.9 - 10.3 mg/dL Final  . Total Protein 05/08/2020 6.6  6.5 - 8.1 g/dL Final  . Albumin 47/82/9562 3.8  3.5 - 5.0 g/dL Final  . AST 13/06/6577 18  15 - 41 U/L Final  . ALT 05/08/2020 15  0 - 44 U/L Final  . Alkaline Phosphatase 05/08/2020 46  38 - 126 U/L Final  . Total Bilirubin 05/08/2020 0.6  0.3 - 1.2 mg/dL Final  . GFR calc non Af Amer 05/08/2020  >60  >60 mL/min Final  . GFR calc Af Amer 05/08/2020 >60  >60 mL/min Final  . Anion gap 05/08/2020 9  5 - 15 Final   Performed at [facility], [address], [place], [place] [address]  . Alcohol, Ethyl (B) 05/08/2020 <10  <10 mg/dL Final   Comment: (NOTE) Lowest detectable limit for serum alcohol is 10 mg/dL.  For medical purposes only. Performed at [facility], [address], [place], [place] [address]   . WBC 05/08/2020 5.4  4.0 - 10.5 K/uL Final  . RBC 05/08/2020 3.87  3.87 - 5.11 MIL/uL Final  . Hemoglobin 05/08/2020 11.0* 12.0 - 15.0 g/dL Final  . HCT 13/24/4010 33.9* 36 - 46 % Final  . MCV 05/08/2020 87.6  80.0 - 100.0 fL Final  . MCH 05/08/2020 28.4  26.0 - 34.0 pg Final  . MCHC 05/08/2020 32.4  30.0 - 36.0 g/dL Final  . RDW 27/25/3664 13.7  11.5 - 15.5 % Final  . Platelets 05/08/2020 303  150 - 400 K/uL Final  . nRBC 05/08/2020 0.0  0.0 - 0.2 % Final  . Neutrophils Relative % 05/08/2020 60  % Final  . Neutro Abs 05/08/2020 3.3  1.7 - 7.7 K/uL Final  . Lymphocytes Relative 05/08/2020 31  % Final  . Lymphs Abs 05/08/2020 1.7  0.7 - 4.0 K/uL Final  . Monocytes Relative 05/08/2020 9  % Final  . Monocytes Absolute 05/08/2020 0.5  0 - 1 K/uL Final  . Eosinophils Relative 05/08/2020 0  % Final  . Eosinophils Absolute 05/08/2020 0.0  0 - 0 K/uL Final  . Basophils Relative 05/08/2020 0  % Final  . Basophils Absolute 05/08/2020 0.0  0 - 0 K/uL Final  . Immature Granulocytes 05/08/2020 0  % Final  . Abs Immature Granulocytes 05/08/2020 0.01  0.00 - 0.07 K/uL Final   Performed at [facility], [address], [place], [place] [address]  . I-stat hCG, quantitative 05/08/2020 <5.0  <5 mIU/mL Final  .  Comment 3 05/08/2020          Final   Comment:   GEST. AGE      CONC.  (mIU/mL)   <=1 WEEK        5 - 50     2 WEEKS       50 - 500     3 WEEKS       100 - 10,000     4 WEEKS     1,000 - 30,000        FEMALE AND NON-PREGNANT FEMALE:     LESS THAN 5 mIU/mL   .  Salicylate Lvl 05/08/2020 <7.0* 7.0 - 30.0 mg/dL Final   Performed at [facility], [address] [place], [place] [address]  . Acetaminophen (Tylenol), Serum 05/08/2020 <10* 10 - 30 ug/mL Final   Comment: (NOTE) Therapeutic concentrations vary significantly. A range of 10-30 ug/mL  may be an effective concentration for many patients. However, some  are best treated at concentrations outside of this range. Acetaminophen concentrations >150 ug/mL at 4 hours after ingestion  and >50 ug/mL at 12 hours after ingestion are often associated with  toxic reactions.  Performed at [facility], [address] [place], [place] [address]   . SARS Coronavirus 2 05/08/2020 NEGATIVE  NEGATIVE Final   Comment: (NOTE) SARS-CoV-2 target nucleic acids are NOT DETECTED.  The SARS-CoV-2 RNA is generally detectable in upper and lower respiratory specimens during the acute phase of infection. The lowest concentration of SARS-CoV-2 viral copies this assay can detect is 250 copies / mL. A negative result does not preclude SARS-CoV-2 infection and should not be used as the sole basis for treatment or other patient management decisions.  A negative result may occur with improper specimen collection / handling, submission of specimen other than nasopharyngeal swab, presence of viral mutation(s) within the areas targeted by this assay, and inadequate number of viral copies (<250 copies / mL). A negative result must be combined with clinical observations, patient history, and epidemiological information.  Fact Sheet for Patients:   BoilerBrush.com.cy  Fact Sheet for Healthcare Providers: https://pope.com/  This test is not yet approved or                           cleared by the [place] FDA and has been authorized for detection and/or diagnosis of SARS-CoV-2 by FDA under an Emergency Use Authorization (EUA).  This EUA will remain in effect  (meaning this test can be used) for the duration of the COVID-19 declaration under Section 564(b)(1) of the Act, 21 U.S.C. section 360bbb-3(b)(1), unless the authorization is terminated or revoked sooner.  Performed at [facility], [address] [place], [place] [address]   Admission on 05/02/2020, Discharged on 05/03/2020  Component Date Value Ref Range Status  . Sodium 05/03/2020 137  135 - 145 mmol/L Final  . Potassium 05/03/2020 3.4* 3.5 - 5.1 mmol/L Final  . Chloride 05/03/2020 104  98 - 111 mmol/L Final  . CO2 05/03/2020 23  22 - 32 mmol/L Final  . Glucose, Bld 05/03/2020 123* 70 - 99 mg/dL Final   Glucose reference range applies only to samples taken after fasting for at least 8 hours.  . BUN 05/03/2020 9  6 - 20 mg/dL Final  . Creatinine, Ser 05/03/2020 0.65  0.44 - 1.00 mg/dL Final  . Calcium 78/46/9629 9.1  8.9 - 10.3 mg/dL Final  .  Total Protein 05/03/2020 6.6  6.5 - 8.1 g/dL Final  . Albumin 16/08/9603 3.7  3.5 - 5.0 g/dL Final  . AST 54/07/8118 20  15 - 41 U/L Final  . ALT 05/03/2020 16  0 - 44 U/L Final  . Alkaline Phosphatase 05/03/2020 39  38 - 126 U/L Final  . Total Bilirubin 05/03/2020 0.5  0.3 - 1.2 mg/dL Final  . GFR calc non Af Amer 05/03/2020 >60  >60 mL/min Final  . GFR calc Af Amer 05/03/2020 >60  >60 mL/min Final  . Anion gap 05/03/2020 10  5 - 15 Final   Performed at [facility], [address], [place], [place] [address]  . Alcohol, Ethyl (B) 05/03/2020 <10  <10 mg/dL Final   Comment: (NOTE) Lowest detectable limit for serum alcohol is 10 mg/dL.  For medical purposes only. Performed at [facility], [address] [place], [place] [address]   . Salicylate Lvl 05/03/2020 <7.0* 7.0 - 30.0 mg/dL Final   Performed at [facility], [address] [place], [place] [address]  . Acetaminophen (Tylenol), Serum 05/03/2020 <10* 10 - 30 ug/mL Final   Comment: (NOTE) Therapeutic concentrations vary significantly. A range of 10-30  ug/mL  may be an effective concentration for many patients. However, some  are best treated at concentrations outside of this range. Acetaminophen concentrations >150 ug/mL at 4 hours after ingestion  and >50 ug/mL at 12 hours after ingestion are often associated with  toxic reactions.  Performed at [facility], [address] [place], [place] [address]   . WBC 05/03/2020 4.9  4.0 - 10.5 K/uL Final  . RBC 05/03/2020 3.89  3.87 - 5.11 MIL/uL Final  . Hemoglobin 05/03/2020 10.8* 12.0 - 15.0 g/dL Final  . HCT 62/95/2841 34.7* 36 - 46 % Final  . MCV 05/03/2020 89.2  80.0 - 100.0 fL Final  . MCH 05/03/2020 27.8  26.0 - 34.0 pg Final  . MCHC 05/03/2020 31.1  30.0 - 36.0 g/dL Final  . RDW 32/44/0102 13.6  11.5 - 15.5 % Final  . Platelets 05/03/2020 289  150 - 400 K/uL Final  . nRBC 05/03/2020 0.0  0.0 - 0.2 % Final   Performed at [facility], [address], [place], [place] [address]  . Opiates 05/02/2020 NONE DETECTED  NONE DETECTED Final  . Cocaine 05/02/2020 NONE DETECTED  NONE DETECTED Final  . Benzodiazepines 05/02/2020 NONE DETECTED  NONE DETECTED Final  . Amphetamines 05/02/2020 NONE DETECTED  NONE DETECTED Final  . Tetrahydrocannabinol 05/02/2020 NONE DETECTED  NONE DETECTED Final  . Barbiturates 05/02/2020 NONE DETECTED  NONE DETECTED Final   Comment: (NOTE) DRUG SCREEN FOR MEDICAL PURPOSES ONLY.  IF CONFIRMATION IS NEEDED FOR ANY PURPOSE, NOTIFY LAB WITHIN 5 DAYS.  LOWEST DETECTABLE LIMITS FOR URINE DRUG SCREEN Drug Class                     Cutoff (ng/mL) Amphetamine and metabolites    1000 Barbiturate and metabolites    200 Benzodiazepine                 200 Tricyclics and metabolites     300 Opiates and metabolites        300 Cocaine and metabolites        300 THC  50 Performed at [facility], [address], [place], [place] [address]   . I-stat hCG, quantitative 05/03/2020 <5.0  <5 mIU/mL Final  . Comment 3  05/03/2020          Final   Comment:   GEST. AGE      CONC.  (mIU/mL)   <=1 WEEK        5 - 50     2 WEEKS       50 - 500     3 WEEKS       100 - 10,000     4 WEEKS     1,000 - 30,000        FEMALE AND NON-PREGNANT FEMALE:     LESS THAN 5 mIU/mL   . SARS Coronavirus 2 05/03/2020 NEGATIVE  NEGATIVE Final   Comment: SARS-CoV-2 target nucleic acids are NOT DETECTED. The SARS-CoV-2 RNA is generally detectable in upper and lower respiratory specimens during the acute phase of infection. The lowest concentration of SARS-CoV-2 viral copies this assay can detect is 250 copies / mL. A negative result does not preclude SARS-CoV-2 infection and should not be used as the sole basis for treatment or other patient management decisions. Performed at [facility], [address] [place], [place] [address]   Admission on 04/23/2020, Discharged on 04/24/2020  Component Date Value Ref Range Status  . WBC 04/23/2020 5.2  4.0 - 10.5 K/uL Final  . RBC 04/23/2020 3.95  3.87 - 5.11 MIL/uL Final  . Hemoglobin 04/23/2020 11.4* 12.0 - 15.0 g/dL Final  . HCT 09/81/1914 34.9* 36 - 46 % Final  . MCV 04/23/2020 88.4  80.0 - 100.0 fL Final  . MCH 04/23/2020 28.9  26.0 - 34.0 pg Final  . MCHC 04/23/2020 32.7  30.0 - 36.0 g/dL Final  . RDW 78/29/5621 13.4  11.5 - 15.5 % Final  . Platelets 04/23/2020 295  150 - 400 K/uL Final  . nRBC 04/23/2020 0.0  0.0 - 0.2 % Final  . Neutrophils Relative % 04/23/2020 49  % Final  . Neutro Abs 04/23/2020 2.5  1.7 - 7.7 K/uL Final  . Lymphocytes Relative 04/23/2020 41  % Final  . Lymphs Abs 04/23/2020 2.1  0.7 - 4.0 K/uL Final  . Monocytes Relative 04/23/2020 9  % Final  . Monocytes Absolute 04/23/2020 0.5  0 - 1 K/uL Final  . Eosinophils Relative 04/23/2020 1  % Final  . Eosinophils Absolute 04/23/2020 0.1  0 - 0 K/uL Final  . Basophils Relative 04/23/2020 0  % Final  . Basophils Absolute 04/23/2020 0.0  0 - 0 K/uL Final  . Immature Granulocytes 04/23/2020 0  % Final  .  Abs Immature Granulocytes 04/23/2020 0.01  0.00 - 0.07 K/uL Final   Performed at [facility], [address] [place], [place] [address]  . Sodium 04/23/2020 136  135 - 145 mmol/L Final  . Potassium 04/23/2020 3.3* 3.5 - 5.1 mmol/L Final  . Chloride 04/23/2020 100  98 - 111 mmol/L Final  . CO2 04/23/2020 25  22 - 32 mmol/L Final  . Glucose, Bld 04/23/2020 79  70 - 99 mg/dL Final   Glucose reference range applies only to samples taken after fasting for at least 8 hours.  . BUN 04/23/2020 9  6 - 20 mg/dL Final  . Creatinine, Ser 04/23/2020 0.60  0.44 - 1.00 mg/dL Final  . Calcium 78/46/9629 9.5  8.9 - 10.3 mg/dL Final  .  GFR calc non Af Amer 04/23/2020 >60  >60 mL/min Final  . GFR calc Af Amer 04/23/2020 >60  >60 mL/min Final  . Anion gap 04/23/2020 11  5 - 15 Final   Performed at [facility], [address] [place], [place] [address]  . Alcohol, Ethyl (B) 04/23/2020 <10  <10 mg/dL Final   Comment: (NOTE) Lowest detectable limit for serum alcohol is 10 mg/dL.  For medical purposes only. Performed at [facility], [address] [place], [place] [address]   . Opiates 04/23/2020 NONE DETECTED  NONE DETECTED Final  . Cocaine 04/23/2020 NONE DETECTED  NONE DETECTED Final  . Benzodiazepines 04/23/2020 NONE DETECTED  NONE DETECTED Final  . Amphetamines 04/23/2020 NONE DETECTED  NONE DETECTED Final  . Tetrahydrocannabinol 04/23/2020 NONE DETECTED  NONE DETECTED Final  . Barbiturates 04/23/2020 NONE DETECTED  NONE DETECTED Final   Comment: (NOTE) DRUG SCREEN FOR MEDICAL PURPOSES ONLY.  IF CONFIRMATION IS NEEDED FOR ANY PURPOSE, NOTIFY LAB WITHIN 5 DAYS.  LOWEST DETECTABLE LIMITS FOR URINE DRUG SCREEN Drug Class                     Cutoff (ng/mL) Amphetamine and metabolites    1000 Barbiturate and metabolites    200 Benzodiazepine                 200 Tricyclics and metabolites     300 Opiates and metabolites         300 Cocaine and metabolites        300 THC                            50 Performed at [facility], [address], [place], [place] [address]   . SARS Coronavirus 2 04/23/2020 NEGATIVE  NEGATIVE Final   Comment: (NOTE) SARS-CoV-2 target nucleic acids are NOT DETECTED.  The SARS-CoV-2 RNA is generally detectable in upper and lower respiratory specimens during the acute phase of infection. The lowest concentration of SARS-CoV-2 viral copies this assay can detect is 250 copies / mL. A negative result does not preclude SARS-CoV-2 infection and should not be used as the sole basis for treatment or other patient management decisions.  A negative result may occur with improper specimen collection / handling, submission of specimen other than nasopharyngeal swab, presence of viral mutation(s) within the areas targeted by this assay, and inadequate number of viral copies (<250 copies / mL). A negative result must be combined with clinical observations, patient history, and epidemiological information.  Fact Sheet for Patients:   BoilerBrush.com.cy  Fact Sheet for Healthcare Providers: https://pope.com/  This test is not yet approved or                           cleared by the [place] FDA and has been authorized for detection and/or diagnosis of SARS-CoV-2 by FDA under an Emergency Use Authorization (EUA).  This EUA will remain in effect (meaning this test can be used) for the duration of the COVID-19 declaration under Section 564(b)(1) of the Act, 21 U.S.C. section 360bbb-3(b)(1), unless the authorization is terminated or revoked sooner.  Performed at [facility], [address], [place], [place] [address]   . I-stat hCG, quantitative 04/23/2020 <5.0  <5 mIU/mL Final  . Comment 3 04/23/2020          Final  Comment:   GEST. AGE      CONC.  (mIU/mL)   <=1 WEEK        5 - 50     2 WEEKS        50 - 500     3 WEEKS       100 - 10,000     4 WEEKS     1,000 - 30,000        FEMALE AND NON-PREGNANT FEMALE:     LESS THAN 5 mIU/mL   Admission on 04/18/2020, Discharged on 04/19/2020  Component Date Value Ref Range Status  . Lipase 04/18/2020 29  11 - 51 U/L Final   Performed at [facility], [address] [place], [place] [address]  . Sodium 04/18/2020 139  135 - 145 mmol/L Final  . Potassium 04/18/2020 3.7  3.5 - 5.1 mmol/L Final  . Chloride 04/18/2020 105  98 - 111 mmol/L Final  . CO2 04/18/2020 23  22 - 32 mmol/L Final  . Glucose, Bld 04/18/2020 105* 70 - 99 mg/dL Final   Glucose reference range applies only to samples taken after fasting for at least 8 hours.  . BUN 04/18/2020 12  6 - 20 mg/dL Final  . Creatinine, Ser 04/18/2020 0.65  0.44 - 1.00 mg/dL Final  . Calcium 19/14/7829 9.7  8.9 - 10.3 mg/dL Final  . Total Protein 04/18/2020 7.3  6.5 - 8.1 g/dL Final  . Albumin 56/21/3086 4.0  3.5 - 5.0 g/dL Final  . AST 57/84/6962 22  15 - 41 U/L Final  . ALT 04/18/2020 16  0 - 44 U/L Final  . Alkaline Phosphatase 04/18/2020 45  38 - 126 U/L Final  . Total Bilirubin 04/18/2020 0.7  0.3 - 1.2 mg/dL Final  . GFR calc non Af Amer 04/18/2020 >60  >60 mL/min Final  . GFR calc Af Amer 04/18/2020 >60  >60 mL/min Final  . Anion gap 04/18/2020 11  5 - 15 Final   Performed at [facility], [address], [place], [place] [address]  . WBC 04/18/2020 4.7  4.0 - 10.5 K/uL Final  . RBC 04/18/2020 4.40  3.87 - 5.11 MIL/uL Final  . Hemoglobin 04/18/2020 12.4  12.0 - 15.0 g/dL Final  . HCT 13/24/4010 39.3  36 - 46 % Final  . MCV 04/18/2020 89.3  80.0 - 100.0 fL Final  . MCH 04/18/2020 28.2  26.0 - 34.0 pg Final  . MCHC 04/18/2020 31.6  30.0 - 36.0 g/dL Final  . RDW 27/25/3664 13.6  11.5 - 15.5 % Final  . Platelets 04/18/2020 309  150 - 400 K/uL Final  . nRBC 04/18/2020 0.0  0.0 - 0.2 % Final   Performed at [facility], [address] [place], [place] [address]  . Color,  Urine 04/18/2020 STRAW* YELLOW Final  . APPearance 04/18/2020 CLEAR  CLEAR Final  . Specific Gravity, Urine 04/18/2020 1.003* 1.005 - 1.030 Final  . pH 04/18/2020 6.0  5.0 - 8.0 Final  . Glucose, UA 04/18/2020 NEGATIVE  NEGATIVE mg/dL Final  . Hgb urine dipstick 04/18/2020 SMALL* NEGATIVE Final  . Bilirubin Urine 04/18/2020 NEGATIVE  NEGATIVE Final  . Ketones, ur 04/18/2020 NEGATIVE  NEGATIVE mg/dL Final  . Protein, ur 42/59/5638 NEGATIVE  NEGATIVE mg/dL Final  . Nitrite 75/64/3329 NEGATIVE  NEGATIVE Final  . Glori Luis 04/18/2020 TRACE* NEGATIVE Final  . RBC / HPF 04/18/2020 0-5  0 - 5 RBC/hpf Final  . WBC, UA  04/18/2020 0-5  0 - 5 WBC/hpf Final  . Bacteria, UA 04/18/2020 RARE* NONE SEEN Final  . Squamous Epithelial / LPF 04/18/2020 6-10  0 - 5 Final   Performed at [facility], [address], [place], [place] [address]  . I-stat hCG, quantitative 04/18/2020 <5.0  <5 mIU/mL Final  . Comment 3 04/18/2020          Final   Comment:   GEST. AGE      CONC.  (mIU/mL)   <=1 WEEK        5 - 50     2 WEEKS       50 - 500     3 WEEKS       100 - 10,000     4 WEEKS     1,000 - 30,000        FEMALE AND NON-PREGNANT FEMALE:     LESS THAN 5 mIU/mL   . Alcohol, Ethyl (B) 04/18/2020 <10  <10 mg/dL Final   Comment: (NOTE) Lowest detectable limit for serum alcohol is 10 mg/dL.  For medical purposes only. Performed at [facility], [address] [place], [place] [address]   . Opiates 04/18/2020 NONE DETECTED  NONE DETECTED Final  . Cocaine 04/18/2020 NONE DETECTED  NONE DETECTED Final  . Benzodiazepines 04/18/2020 NONE DETECTED  NONE DETECTED Final  . Amphetamines 04/18/2020 NONE DETECTED  NONE DETECTED Final  . Tetrahydrocannabinol 04/18/2020 NONE DETECTED  NONE DETECTED Final  . Barbiturates 04/18/2020 NONE DETECTED  NONE DETECTED Final   Comment: (NOTE) DRUG SCREEN FOR MEDICAL PURPOSES ONLY.  IF CONFIRMATION IS NEEDED FOR ANY PURPOSE, NOTIFY LAB WITHIN 5 DAYS.  LOWEST  DETECTABLE LIMITS FOR URINE DRUG SCREEN Drug Class                     Cutoff (ng/mL) Amphetamine and metabolites    1000 Barbiturate and metabolites    200 Benzodiazepine                 200 Tricyclics and metabolites     300 Opiates and metabolites        300 Cocaine and metabolites        300 THC                            50 Performed at [facility], [address], [place], [place] [address]   . SARS Coronavirus 2 04/18/2020 NEGATIVE  NEGATIVE Final   Comment: (NOTE) SARS-CoV-2 target nucleic acids are NOT DETECTED.  The SARS-CoV-2 RNA is generally detectable in upper and lower respiratory specimens during the acute phase of infection. The lowest concentration of SARS-CoV-2 viral copies this assay can detect is 250 copies / mL. A negative result does not preclude SARS-CoV-2 infection and should not be used as the sole basis for treatment or other patient management decisions.  A negative result may occur with improper specimen collection / handling, submission of specimen other than nasopharyngeal swab, presence of viral mutation(s) within the areas targeted by this assay, and inadequate number of viral copies (<250 copies / mL). A negative result must be combined with clinical observations, patient history, and epidemiological information.  Fact Sheet for Patients:   BoilerBrush.com.cy  Fact Sheet for Healthcare Providers: https://pope.com/  This test is not yet approved or  cleared by the [place] and has been authorized for detection and/or diagnosis of SARS-CoV-2 by FDA under an Emergency Use Authorization (EUA).  This EUA will remain in effect (meaning this test can be used) for the duration of the COVID-19 declaration under Section 564(b)(1) of the Act, 21 U.S.C. section 360bbb-3(b)(1), unless the authorization is terminated or revoked sooner.  Performed at [facility], [address] [place], [place] [address]   Admission on 03/27/2020, Discharged on 03/28/2020  Component Date Value Ref Range Status  . SARS Coronavirus 2 03/27/2020 NEGATIVE  NEGATIVE Final   Comment: (NOTE) SARS-CoV-2 target nucleic acids are NOT DETECTED. The SARS-CoV-2 RNA is generally detectable in upper and lower respiratory specimens during the acute phase of infection. The lowest concentration of SARS-CoV-2 viral copies this assay can detect is 250 copies / mL. A negative result does not preclude SARS-CoV-2 infection and should not be used as the sole basis for treatment or other patient management decisions.  A negative result may occur with improper specimen collection / handling, submission of specimen other than nasopharyngeal swab, presence of viral mutation(s) within the areas targeted by this assay, and inadequate number of viral copies (<250 copies / mL). A negative result must be combined with clinical observations, patient history, and epidemiological information. Fact Sheet for Patients:   BoilerBrush.com.cy Fact Sheet for Healthcare Providers: https://pope.com/ This test is not yet approved or cleared                           by the [place] FDA and has been authorized for detection and/or diagnosis of SARS-CoV-2 by FDA under an Emergency Use Authorization (EUA).  This EUA will remain in effect (meaning this test can be used) for the duration of the COVID-19 declaration under Section 564(b)(1) of the Act, 21 U.S.C. section 360bbb-3(b)(1), unless the authorization is terminated or revoked sooner. Performed at [facility], [address], [place], [place] [address]   Admission on 03/20/2020, Discharged on 03/21/2020  Component Date Value Ref Range Status  . SARS Coronavirus 2 03/20/2020 NEGATIVE  NEGATIVE Final   Comment: (NOTE) SARS-CoV-2 target nucleic acids are NOT  DETECTED. The SARS-CoV-2 RNA is generally detectable in upper and lower respiratory specimens during the acute phase of infection. The lowest concentration of SARS-CoV-2 viral copies this assay can detect is 250 copies / mL. A negative result does not preclude SARS-CoV-2 infection and should not be used as the sole basis for treatment or other patient management decisions.  A negative result may occur with improper specimen collection / handling, submission of specimen other than nasopharyngeal swab, presence of viral mutation(s) within the areas targeted by this assay, and inadequate number of viral copies (<250 copies / mL). A negative result must be combined with clinical observations, patient history, and epidemiological information. Fact Sheet for Patients:   BoilerBrush.com.cy Fact Sheet for Healthcare Providers: https://pope.com/ This test is not yet approved or cleared                           by the [place] FDA and has been authorized for detection and/or diagnosis of SARS-CoV-2 by FDA under an Emergency Use Authorization (EUA).  This EUA will remain in effect (meaning this test can be used) for the duration of the COVID-19 declaration under Section 564(b)(1) of the Act, 21 U.S.C. section 360bbb-3(b)(1), unless the authorization is terminated  or revoked sooner. Performed at [facility], [address] [place], [place] [address]   Admission on 03/10/2020, Discharged on 03/10/2020  Component Date Value Ref Range Status  . Sodium 03/10/2020 138  135 - 145 mmol/L Final  . Potassium 03/10/2020 3.8  3.5 - 5.1 mmol/L Final  . Chloride 03/10/2020 105  98 - 111 mmol/L Final  . CO2 03/10/2020 25  22 - 32 mmol/L Final  . Glucose, Bld 03/10/2020 100* 70 - 99 mg/dL Final   Glucose reference range applies only to samples taken after fasting for at least 8 hours.  . BUN 03/10/2020 13  6 - 20 mg/dL Final  .  Creatinine, Ser 03/10/2020 0.71  0.44 - 1.00 mg/dL Final  . Calcium 60/45/4098 9.3  8.9 - 10.3 mg/dL Final  . Total Protein 03/10/2020 6.6  6.5 - 8.1 g/dL Final  . Albumin 11/91/4782 3.8  3.5 - 5.0 g/dL Final  . AST 95/62/1308 17  15 - 41 U/L Final  . ALT 03/10/2020 16  0 - 44 U/L Final  . Alkaline Phosphatase 03/10/2020 48  38 - 126 U/L Final  . Total Bilirubin 03/10/2020 0.2* 0.3 - 1.2 mg/dL Final  . GFR calc non Af Amer 03/10/2020 >60  >60 mL/min Final  . GFR calc Af Amer 03/10/2020 >60  >60 mL/min Final  . Anion gap 03/10/2020 8  5 - 15 Final   Performed at [facility], [address], [place], [place] [address]  . Alcohol, Ethyl (B) 03/10/2020 <10  <10 mg/dL Final   Comment: (NOTE) Lowest detectable limit for serum alcohol is 10 mg/dL. For medical purposes only. Performed at [facility], [address], [place], [place] [address]   . Salicylate Lvl 03/10/2020 <7.0* 7.0 - 30.0 mg/dL Final   Performed at [facility], [address] [place], [place] [address]  . Acetaminophen (Tylenol), Serum 03/10/2020 <10* 10 - 30 ug/mL Final   Comment: (NOTE) Therapeutic concentrations vary significantly. A range of 10-30 ug/mL  may be an effective concentration for many patients. However, some  are best treated at concentrations outside of this range. Acetaminophen concentrations >150 ug/mL at 4 hours after ingestion  and >50 ug/mL at 12 hours after ingestion are often associated with  toxic reactions. Performed at [facility], [address], [place], [place] [address]   . WBC 03/10/2020 7.0  4.0 - 10.5 K/uL Final  . RBC 03/10/2020 4.18  3.87 - 5.11 MIL/uL Final  . Hemoglobin 03/10/2020 11.9* 12.0 - 15.0 g/dL Final  . HCT 09/02/2535 38.3  36 - 46 % Final  . MCV 03/10/2020 91.6  80.0 - 100.0 fL Final  . MCH 03/10/2020 28.5  26.0 - 34.0 pg Final  . MCHC 03/10/2020 31.1  30.0 - 36.0 g/dL Final  . RDW 64/40/3474 13.3  11.5 - 15.5 % Final  . Platelets 03/10/2020  306  150 - 400 K/uL Final  . nRBC 03/10/2020 0.0  0.0 - 0.2 % Final   Performed at [facility], [address], [place], [place] [address]  . SARS Coronavirus 2 by RT PCR 03/10/2020 NEGATIVE  NEGATIVE Final   Comment: (NOTE) SARS-CoV-2 target nucleic acids are NOT DETECTED. The SARS-CoV-2 RNA is generally detectable in upper respiratoy specimens during the acute phase of infection. The lowest concentration of SARS-CoV-2 viral copies this assay can detect is 131 copies/mL. A negative result does not preclude SARS-Cov-2 infection and should not  be used as the sole basis for treatment or other patient management decisions. A negative result may occur with  improper specimen collection/handling, submission of specimen other than nasopharyngeal swab, presence of viral mutation(s) within the areas targeted by this assay, and inadequate number of viral copies (<131 copies/mL). A negative result must be combined with clinical observations, patient history, and epidemiological information. The expected result is Negative. Fact Sheet for Patients:  https://www.moore.com/ Fact Sheet for Healthcare Providers:  https://www.young.biz/ This test is not yet ap                          proved or cleared by the [place] FDA and  has been authorized for detection and/or diagnosis of SARS-CoV-2 by FDA under an Emergency Use Authorization (EUA). This EUA will remain  in effect (meaning this test can be used) for the duration of the COVID-19 declaration under Section 564(b)(1) of the Act, 21 U.S.C. section 360bbb-3(b)(1), unless the authorization is terminated or revoked sooner.   . Influenza A by PCR 03/10/2020 NEGATIVE  NEGATIVE Final  . Influenza B by PCR 03/10/2020 NEGATIVE  NEGATIVE Final   Comment: (NOTE) The Xpert Xpress SARS-CoV-2/FLU/RSV assay is intended as an aid in  the diagnosis of influenza from Nasopharyngeal swab specimens and  should  not be used as a sole basis for treatment. Nasal washings and  aspirates are unacceptable for Xpert Xpress SARS-CoV-2/FLU/RSV  testing. Fact Sheet for Patients: https://www.moore.com/ Fact Sheet for Healthcare Providers: https://www.young.biz/ This test is not yet approved or cleared by the [place] FDA and  has been authorized for detection and/or diagnosis of SARS-CoV-2 by  FDA under an Emergency Use Authorization (EUA). This EUA will remain  in effect (meaning this test can be used) for the duration of the  Covid-19 declaration under Section 564(b)(1) of the Act, 21  U.S.C. section 360bbb-3(b)(1), unless the authorization is  terminated or revoked. Performed at [facility], [address] [place], [place] [address]   Admission on 02/14/2020, Discharged on 02/14/2020  Component Date Value Ref Range Status  . I-stat hCG, quantitative 02/14/2020 <5.0  <5 mIU/mL Final  . Comment 3 02/14/2020          Final   Comment:   GEST. AGE      CONC.  (mIU/mL)   <=1 WEEK        5 - 50     2 WEEKS       50 - 500     3 WEEKS       100 - 10,000     4 WEEKS     1,000 - 30,000        FEMALE AND NON-PREGNANT FEMALE:     LESS THAN 5 mIU/mL   . Sodium 02/14/2020 139  135 - 145 mmol/L Final  . Potassium 02/14/2020 3.5  3.5 - 5.1 mmol/L Final  . Chloride 02/14/2020 101  98 - 111 mmol/L Final  . BUN 02/14/2020 9  6 - 20 mg/dL Final  . Creatinine, Ser 02/14/2020 0.60  0.44 - 1.00 mg/dL Final  . Glucose, Bld 00/86/7619 81  70 - 99 mg/dL Final   Glucose reference range applies only to samples taken after fasting for at least 8 hours.  . Calcium, Ion 02/14/2020 1.25  1.15 - 1.40 mmol/L Final  . TCO2 02/14/2020 27  22 - 32 mmol/L Final  . Hemoglobin 02/14/2020 12.6  12.0 - 15.0 g/dL Final  .  HCT 02/14/2020 37.0  36 - 46 % Final  There may be more visits with results that are not included.    Allergies: Patient has no known allergies.  PTA  Medications: (Not in a hospital admission)   Medical Decision Making  Patient was brought into the [facility] by police for psychiatric evaluation. 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 Jola Babinski.  Today she appears to be at her baseline with hallucinations that are chronic in nature, she is alert and coherent. She is able to engage in a linear conversation, it is when she is alone that she is observed hallucinating, mumbling, and chanting. She is also observed pacing and rocking while sitting, again this appears to be her norm. Patient denied any depression or suicidal thoughts. Patient had no physical complaints. I do not feel that patient needs to be put under IVC commitment nor to have any emergent psychiatric evaluation. Patient states that if she was discharged then she would not feel safe and God is going to kill her.     Recommendations  Based on my evaluation the patient does not appear to have an emergency medical condition. Recommend observation overnight and discharge tomorrow to her ACT team when social work is available. Patient seeking comfort in the [facility] for safety concerns and at times does appear to be actively psychotic and hallucinatind. WIll order PRN medications for her safety and ongoing psychosis. Recently discharged will not order all routine labs. Previous labs have been reviewed at this time, and determined to be normal. She will be admitted overnight and resumed on home medications. She received her last Invega injection in due, next injection is due 05/31/2020 will order this prior to discharge tomorrow. Patient advised we will need COVID test, she agrees to ocnsent.   Maryagnes Amos, FNP 05/29/20  6:20 PM

## 2020-05-30 DIAGNOSIS — R45851 Suicidal ideations: Secondary | ICD-10-CM | POA: Diagnosis not present

## 2020-05-30 DIAGNOSIS — Z79899 Other long term (current) drug therapy: Secondary | ICD-10-CM | POA: Diagnosis not present

## 2020-05-30 DIAGNOSIS — F200 Paranoid schizophrenia: Secondary | ICD-10-CM | POA: Diagnosis not present

## 2020-05-30 DIAGNOSIS — R45850 Homicidal ideations: Secondary | ICD-10-CM | POA: Diagnosis not present

## 2020-05-30 DIAGNOSIS — F329 Major depressive disorder, single episode, unspecified: Secondary | ICD-10-CM | POA: Diagnosis not present

## 2020-05-30 MED ORDER — BENZTROPINE MESYLATE 1 MG PO TABS: 1.0000 mg | ORAL_TABLET | Freq: Two times a day (BID) | ORAL | 0 refills | Status: DC

## 2020-05-30 MED ORDER — SERTRALINE HCL 50 MG PO TABS: 50.0000 mg | ORAL_TABLET | Freq: Every day | ORAL | 0 refills | Status: DC

## 2020-05-30 MED ORDER — PALIPERIDONE PALMITATE ER 234 MG/1.5ML IM SUSY: 234.0000 mg | PREFILLED_SYRINGE | INTRAMUSCULAR | 0 refills | Status: DC

## 2020-05-30 MED ORDER — CHLORPROMAZINE HCL 100 MG PO TABS: 100.0000 mg | ORAL_TABLET | Freq: Three times a day (TID) | ORAL | 0 refills | Status: DC

## 2020-05-30 MED ORDER — TRAZODONE HCL 150 MG PO TABS: 150.0000 mg | ORAL_TABLET | Freq: Every day | ORAL | 0 refills | Status: DC

## 2020-05-30 MED ORDER — RISPERIDONE 2 MG PO TBDP: 2.0000 mg | ORAL_TABLET | Freq: Two times a day (BID) | ORAL | 0 refills | Status: DC

## 2020-05-30 NOTE — Discharge Instructions (Addendum)
Activity as tolerated. Diet as recommended by primary care physician. Keep all scheduled follow-up appointments as recommended.  

## 2020-05-30 NOTE — ED Provider Notes (Signed)
FBC/OBS ASAP Discharge Summary  Date and Time: 05/30/2020 12:38 PM  Name: [name] [name]  MRN:  [id number]   Discharge Diagnoses:  Final diagnoses:  Schizophrenia, paranoid [facility])   Stay Summary: From admission H&P 05/29/2020: [name] [name] 44 y.o. who presented to [facility] voluntarily stating "[name] is trying to kill me. God and my sister are after me." She states she was recently discharged from [facility] on 05/28/2020, this was verified by chart review. She was stepped up to an ACTT from [facility] during this admission. She states she does not feel safe and needs to come in for a little while. SHe also reports her immediate decompensation comes from not getting all her medications from the pharmacy, although this is unclear as she has retrieved all her medications. She denies suicidal ideation and homicidal ideations. While typing this note patient is her mumbling to self and then talking and chanting in a loud voice " MY ass is dying. My ass is dying. " Then she proceeds to have an argument with her self that she is not dying and "God trying to her. "   Patient seen. Chart reviewed. Patient is familiar to me from numerous hospitalizations and ED visits. She was just discharged from [facility] three days ago and looks significantly improved from her typical presentation today. Her medications were restarted yesterday, and she received monthly Invega Sustenna 234 mg this morning. Chronic AH of God's voice continue, but she appears less focused on AVH and religious delusions at this time. Denies CAH. She denies SI/HI. She reports paranoia and anxiety are improved after restarting medications yesterday. She reports that after discharge three days ago, she attempted to have her prescriptions filled but was told that insurance would not allow the refills until tomorrow.   With patient's expressed consent, collateral information from [facility] ACT team verifies patient's medications cannot be filled until  tomorrow. Patient will discharge today with 1 day samples of her medications until they can be refilled tomorrow. ACT team is picking her up for discharge home.  Total Time spent with patient: 20 minutes  Past Psychiatric History: History of schizophrenia with numerous hospitalizations, ED visits. See above. Past Medical History:  Past Medical History:  Diagnosis Date  . Arthritis   . Depression   . Hallucinations   . Paranoid schizophrenia (HCC)   . Schizoaffective disorder (HCC)    History reviewed. No pertinent surgical history. Family History: History reviewed. No pertinent family history. Family Psychiatric History: Unknown Social History:  Social History   Substance and Sexual Activity  Alcohol Use Not Currently   Comment: She denies      Social History   Substance and Sexual Activity  Drug Use Not Currently   Comment: She denies     Social History   Socioeconomic History  . Marital status: Single    Spouse name: Not on file  . Number of children: Not on file  . Years of education: Not on file  . Highest education level: Not on file  Occupational History  . Occupation: Disability  Tobacco Use  . Smoking status: Never Smoker  . Smokeless tobacco: Never Used  Vaping Use  . Vaping Use: Never used  Substance and Sexual Activity  . Alcohol use: Not Currently    Comment: She denies   . Drug use: Not Currently    Comment: She denies   . Sexual activity: Not Currently  Other Topics Concern  . Not on file  Social History Narrative  Pt stated that she lives in [place], and that she lives alone.  Pt receives outpatient psychiatric resources through [employer].   Social Determinants of Health   Financial Resource Strain:   . Difficulty of Paying Living Expenses:   Food Insecurity:   . Worried About Programme researcher, broadcasting/film/video in the Last Year:   . Barista in the Last Year:   Transportation Needs:   . Freight forwarder (Medical):   Marland Kitchen Lack of  Transportation (Non-Medical):   Physical Activity:   . Days of Exercise per Week:   . Minutes of Exercise per Session:   Stress:   . Feeling of Stress :   Social Connections:   . Frequency of Communication with Friends and Family:   . Frequency of Social Gatherings with Friends and Family:   . Attends Religious Services:   . Active Member of Clubs or Organizations:   . Attends Banker Meetings:   Marland Kitchen Marital Status:    SDOH:  SDOH Screenings   Alcohol Screen: Low Risk   . Last Alcohol Screening Score (AUDIT): 0  Depression (PHQ2-9): Medium Risk  . PHQ-2 Score: 10  Financial Resource Strain:   . Difficulty of Paying Living Expenses:   Food Insecurity:   . Worried About Programme researcher, broadcasting/film/video in the Last Year:   . [employer] of Food in the Last Year:   Housing:   . Last Housing Risk Score:   Physical Activity:   . Days of Exercise per Week:   . Minutes of Exercise per Session:   Social Connections:   . Frequency of Communication with Friends and Family:   . Frequency of Social Gatherings with Friends and Family:   . Attends Religious Services:   . Active Member of Clubs or Organizations:   . Attends Banker Meetings:   Marland Kitchen Marital Status:   Stress:   . Feeling of Stress :   Tobacco Use: Low Risk   . Smoking Tobacco Use: Never Smoker  . Smokeless Tobacco Use: Never Used  Transportation Needs:   . Freight forwarder (Medical):   Marland Kitchen Lack of Transportation (Non-Medical):     Has this patient used any form of tobacco in the last 30 days? (Cigarettes, Smokeless Tobacco, Cigars, and/or Pipes) No  Current Medications:  Current Facility-Administered Medications  Medication Dose Route Frequency Provider Last Rate Last Admin  . acetaminophen (TYLENOL) tablet 650 mg  650 mg Oral Q6H PRN Maryagnes Amos, FNP      . alum & mag hydroxide-simeth (MAALOX/MYLANTA) 200-200-20 MG/5ML suspension 30 mL  30 mL Oral Q4H PRN Starkes-Perry, Juel Burrow, FNP      .  benztropine (COGENTIN) tablet 1 mg  1 mg Oral BID Maryagnes Amos, FNP   1 mg at 05/30/20 0934  . chlorproMAZINE (THORAZINE) tablet 100 mg  100 mg Oral TID Maryagnes Amos, FNP   100 mg at 05/30/20 0935  . magnesium hydroxide (MILK OF MAGNESIA) suspension 30 mL  30 mL Oral Daily PRN Maryagnes Amos, FNP      . paliperidone (INVEGA SUSTENNA) injection 234 mg  234 mg Intramuscular Q28 days Maryagnes Amos, FNP   234 mg at 05/30/20 0844  . risperiDONE (RISPERDAL M-TABS) disintegrating tablet 2 mg  2 mg Oral BID Maryagnes Amos, FNP   2 mg at 05/30/20 0935  . sertraline (ZOLOFT) tablet 50 mg  50 mg Oral Daily Starkes-Perry, Juel Burrow, FNP  50 mg at 05/30/20 0935  . traZODone (DESYREL) tablet 150 mg  150 mg Oral QHS Maryagnes Amos, FNP   150 mg at 05/29/20 2201   Current Outpatient Medications  Medication Sig Dispense Refill  . benztropine (COGENTIN) 1 MG tablet Take 1 tablet (1 mg total) by mouth 2 (two) times daily. 60 tablet 0  . chlorproMAZINE (THORAZINE) 100 MG tablet Take 1 tablet (100 mg total) by mouth 3 (three) times daily. 90 tablet 0  . [START ON 05/31/2020] paliperidone (INVEGA SUSTENNA) 234 MG/1.5ML SUSY injection Inject 234 mg into the muscle every 28 (twenty-eight) days. (Due on 06-27-20) 1.5 mL 0  . risperiDONE (RISPERDAL M-TABS) 2 MG disintegrating tablet Take 1 tablet (2 mg total) by mouth 2 (two) times daily. 60 tablet 0  . [START ON 05/31/2020] sertraline (ZOLOFT) 50 MG tablet Take 1 tablet (50 mg total) by mouth daily. 30 tablet 0  . traZODone (DESYREL) 150 MG tablet Take 1 tablet (150 mg total) by mouth at bedtime. 30 tablet 0    PTA Medications: (Not in a hospital admission)   Musculoskeletal  Strength & Muscle Tone: within normal limits Gait & Station: normal Patient leans: N/A  Psychiatric Specialty Exam  Presentation  General Appearance: Casual  Eye Contact:Fair  Speech:Normal Rate  Speech  Volume:Decreased  Handedness:Right   Mood and Affect  Mood:Euthymic  Affect:Constricted   Thought Process  Thought Processes:Coherent  Descriptions of Associations:Circumstantial  Orientation:Full (Time, Place and Person)  Thought Content:Delusions  Hallucinations:Hallucinations: Auditory Description of Command Hallucinations: God and my sister are telling me to hurt myself. He messes with me real bad when I dont have my medicine Description of Auditory Hallucinations: God is saying bad things about my sister  Ideas of Reference:Delusions  Suicidal Thoughts:Suicidal Thoughts: No  Homicidal Thoughts:Homicidal Thoughts: No   Sensorium  Memory:Immediate Fair;Recent Fair;Remote Fair  Judgment:Fair  Insight:Fair   Executive Functions  Concentration:Fair  Attention Span:Fair  Recall:Fair  Fund of Knowledge:Fair  Language:Fair   Psychomotor Activity  Psychomotor Activity:Psychomotor Activity: Normal   Assets  Assets:Communication Skills;Desire for Improvement;Social Support;Resilience;Leisure Time   Sleep  Sleep:Sleep: Fair   Physical Exam  Physical Exam ROS Blood pressure 117/79, pulse 98, temperature 97.7 F (36.5 C), temperature source Temporal, resp. rate 18, height 4\' 10"  (1.473 m), weight 121 lb (54.9 kg), last menstrual period 05/19/2020, SpO2 100 %. Body mass index is 25.29 kg/m.  Demographic Factors:  Low socioeconomic status  Loss Factors: NA  Historical Factors: Impulsivity  Risk Reduction Factors:   Religious beliefs about death, Positive social support and Positive therapeutic relationship  Continued Clinical Symptoms:  Schizophrenia:   Paranoid or undifferentiated type  Cognitive Features That Contribute To Risk:  None    Suicide Risk:  Minimal: No identifiable suicidal ideation.  Patients presenting with no risk factors but with morbid ruminations; may be classified as minimal risk based on the severity of the depressive  symptoms  Plan Of Care/Follow-up recommendations:  Continue to follow up with [facility] ACT team. Activity as tolerated. Diet as recommended by primary care physician. Keep all scheduled follow-up appointments as recommended.  Disposition: Patient does not meet inpatient criteria. She is discharging home with continued ACT team services, provided with one day of medication samples until prescriptions can be filled tomorrow.  05/21/2020, NP 05/30/2020, 12:38 PM

## 2020-05-30 NOTE — ED Notes (Signed)
Pt A&O x 4, pending DC with ACT team, RN attempting to contact at present.

## 2020-05-30 NOTE — ED Notes (Signed)
Pt alert and oriented on the unit and denies any pain. Education, support and encouragement provided. Medications administered per MD orders. No reactions/side effects to medicine noted. Pt denies any concerns at this time and verbally contracts for safety. Pt ambulating on the unit with no issues and is currently on her chair bed watching television. Pt remains safe on the unit.

## 2020-05-30 NOTE — ED Notes (Signed)
Round conducted on patient during shift change. Patient is quite, reading comfortable

## 2020-05-30 NOTE — ED Notes (Signed)
Pt watching tv

## 2020-05-30 NOTE — ED Notes (Addendum)
Pt DCed with ACT Team [place].  Clothing & valuables returned to pt.

## 2020-05-30 NOTE — ED Notes (Signed)
Pt is reading a book

## 2020-05-30 NOTE — Progress Notes (Signed)
CSW spoke with [place] from [facility] ACT team to notify her that pt is psychiatrically stable for discharge and to request transport home. [name] shared that the ACT team has an appt with pt for Monday, 7/26 to pick up her medications from the pharmacy but is concerned that pt will not have her medications between now and tomorrow. CSW notified Marciano Sequin NP of concern and to attempt to obtain medication samples. [name] or a member of pt's ACT team will pick her up in the next few hours to transport her home.   Khiya Friese S. Alan Ripper, MSW, LCSW Clinical Social Worker 05/30/2020 10:06 AM

## 2020-05-30 NOTE — ED Notes (Signed)
Pt is resting comfortably on her chair bed watching television. Pt has been ambulatory to the bathroom and calm and cooperative on the unit. Pt will continues to be monitored. Pt remains safe on the unit.

## 2020-05-30 NOTE — ED Notes (Signed)
Lying in bed with eyes open

## 2020-05-30 NOTE — ED Notes (Signed)
Pt sleeping at present, no distress noted, resting comfortably, monitoring for safety. 

## 2020-06-04 ENCOUNTER — Ambulatory Visit (HOSPITAL_COMMUNITY): Payer: Medicare Other | Admitting: Licensed Clinical Social Worker

## 2020-06-17 DIAGNOSIS — F29 Unspecified psychosis not due to a substance or known physiological condition: Secondary | ICD-10-CM | POA: Diagnosis not present

## 2020-06-17 DIAGNOSIS — Z20822 Contact with and (suspected) exposure to covid-19: Secondary | ICD-10-CM | POA: Diagnosis not present

## 2020-06-17 DIAGNOSIS — Z0289 Encounter for other administrative examinations: Secondary | ICD-10-CM | POA: Diagnosis not present

## 2020-06-17 DIAGNOSIS — F23 Brief psychotic disorder: Secondary | ICD-10-CM | POA: Diagnosis not present

## 2020-06-17 DIAGNOSIS — Z79899 Other long term (current) drug therapy: Secondary | ICD-10-CM | POA: Diagnosis not present

## 2020-06-17 DIAGNOSIS — Z743 Need for continuous supervision: Secondary | ICD-10-CM | POA: Diagnosis not present

## 2020-06-17 DIAGNOSIS — F250 Schizoaffective disorder, bipolar type: Secondary | ICD-10-CM | POA: Diagnosis not present

## 2020-06-18 DIAGNOSIS — F250 Schizoaffective disorder, bipolar type: Secondary | ICD-10-CM | POA: Diagnosis not present

## 2020-06-18 DIAGNOSIS — F29 Unspecified psychosis not due to a substance or known physiological condition: Secondary | ICD-10-CM | POA: Diagnosis not present

## 2020-06-19 DIAGNOSIS — F29 Unspecified psychosis not due to a substance or known physiological condition: Secondary | ICD-10-CM | POA: Diagnosis not present

## 2020-06-20 DIAGNOSIS — Z79899 Other long term (current) drug therapy: Secondary | ICD-10-CM | POA: Diagnosis not present

## 2020-06-20 DIAGNOSIS — F29 Unspecified psychosis not due to a substance or known physiological condition: Secondary | ICD-10-CM | POA: Diagnosis not present

## 2020-06-20 DIAGNOSIS — Z20822 Contact with and (suspected) exposure to covid-19: Secondary | ICD-10-CM | POA: Diagnosis not present

## 2020-06-20 DIAGNOSIS — F250 Schizoaffective disorder, bipolar type: Secondary | ICD-10-CM | POA: Diagnosis not present

## 2020-06-20 DIAGNOSIS — R451 Restlessness and agitation: Secondary | ICD-10-CM | POA: Diagnosis not present

## 2020-06-20 DIAGNOSIS — F99 Mental disorder, not otherwise specified: Secondary | ICD-10-CM | POA: Diagnosis not present

## 2020-06-20 DIAGNOSIS — Z743 Need for continuous supervision: Secondary | ICD-10-CM | POA: Diagnosis not present

## 2020-06-20 DIAGNOSIS — F200 Paranoid schizophrenia: Secondary | ICD-10-CM | POA: Diagnosis not present

## 2020-06-24 ENCOUNTER — Telehealth (HOSPITAL_COMMUNITY): Payer: Medicare Other | Admitting: Psychiatry

## 2020-06-25 DIAGNOSIS — Z9114 Patient's other noncompliance with medication regimen: Secondary | ICD-10-CM | POA: Diagnosis not present

## 2020-06-25 DIAGNOSIS — F99 Mental disorder, not otherwise specified: Secondary | ICD-10-CM | POA: Diagnosis not present

## 2020-06-25 DIAGNOSIS — D6489 Other specified anemias: Secondary | ICD-10-CM | POA: Diagnosis not present

## 2020-06-25 DIAGNOSIS — R000 Tachycardia, unspecified: Secondary | ICD-10-CM | POA: Diagnosis not present

## 2020-06-25 DIAGNOSIS — R059 Cough, unspecified: Secondary | ICD-10-CM | POA: Diagnosis not present

## 2020-06-25 DIAGNOSIS — F209 Schizophrenia, unspecified: Secondary | ICD-10-CM | POA: Diagnosis not present

## 2020-06-25 DIAGNOSIS — Z743 Need for continuous supervision: Secondary | ICD-10-CM | POA: Diagnosis not present

## 2020-06-25 DIAGNOSIS — R197 Diarrhea, unspecified: Secondary | ICD-10-CM | POA: Diagnosis not present

## 2020-06-25 DIAGNOSIS — R109 Unspecified abdominal pain: Secondary | ICD-10-CM | POA: Diagnosis not present

## 2020-06-25 DIAGNOSIS — G4701 Insomnia due to medical condition: Secondary | ICD-10-CM | POA: Diagnosis not present

## 2020-06-25 DIAGNOSIS — R002 Palpitations: Secondary | ICD-10-CM | POA: Diagnosis not present

## 2020-06-25 DIAGNOSIS — F419 Anxiety disorder, unspecified: Secondary | ICD-10-CM | POA: Diagnosis not present

## 2020-06-25 DIAGNOSIS — G4700 Insomnia, unspecified: Secondary | ICD-10-CM | POA: Diagnosis not present

## 2020-06-25 DIAGNOSIS — D649 Anemia, unspecified: Secondary | ICD-10-CM | POA: Diagnosis not present

## 2020-06-25 DIAGNOSIS — F6389 Other impulse disorders: Secondary | ICD-10-CM | POA: Diagnosis not present

## 2020-06-25 DIAGNOSIS — E871 Hypo-osmolality and hyponatremia: Secondary | ICD-10-CM | POA: Diagnosis not present

## 2020-06-25 DIAGNOSIS — R05 Cough: Secondary | ICD-10-CM | POA: Diagnosis not present

## 2020-06-25 DIAGNOSIS — Z781 Physical restraint status: Secondary | ICD-10-CM | POA: Diagnosis not present

## 2020-07-13 DIAGNOSIS — F419 Anxiety disorder, unspecified: Secondary | ICD-10-CM | POA: Diagnosis not present

## 2020-07-13 DIAGNOSIS — R000 Tachycardia, unspecified: Secondary | ICD-10-CM | POA: Diagnosis not present

## 2020-07-13 DIAGNOSIS — G4701 Insomnia due to medical condition: Secondary | ICD-10-CM | POA: Diagnosis not present

## 2020-08-09 DIAGNOSIS — F419 Anxiety disorder, unspecified: Secondary | ICD-10-CM | POA: Diagnosis not present

## 2020-08-09 DIAGNOSIS — R000 Tachycardia, unspecified: Secondary | ICD-10-CM | POA: Diagnosis not present

## 2020-08-09 DIAGNOSIS — F209 Schizophrenia, unspecified: Secondary | ICD-10-CM | POA: Diagnosis not present

## 2020-08-09 DIAGNOSIS — D6489 Other specified anemias: Secondary | ICD-10-CM | POA: Diagnosis not present

## 2020-08-10 DIAGNOSIS — D649 Anemia, unspecified: Secondary | ICD-10-CM | POA: Diagnosis not present

## 2020-08-10 DIAGNOSIS — M79675 Pain in left toe(s): Secondary | ICD-10-CM | POA: Diagnosis not present

## 2020-08-10 DIAGNOSIS — Z5189 Encounter for other specified aftercare: Secondary | ICD-10-CM | POA: Diagnosis not present

## 2020-08-10 DIAGNOSIS — F6389 Other impulse disorders: Secondary | ICD-10-CM | POA: Diagnosis not present

## 2020-08-10 DIAGNOSIS — L84 Corns and callosities: Secondary | ICD-10-CM | POA: Diagnosis not present

## 2020-08-10 DIAGNOSIS — R4588 Nonsuicidal self-harm: Secondary | ICD-10-CM | POA: Diagnosis not present

## 2020-08-10 DIAGNOSIS — K30 Functional dyspepsia: Secondary | ICD-10-CM | POA: Diagnosis not present

## 2020-08-10 DIAGNOSIS — R519 Headache, unspecified: Secondary | ICD-10-CM | POA: Diagnosis not present

## 2020-08-10 DIAGNOSIS — R059 Cough, unspecified: Secondary | ICD-10-CM | POA: Diagnosis not present

## 2020-08-10 DIAGNOSIS — Z781 Physical restraint status: Secondary | ICD-10-CM | POA: Diagnosis not present

## 2020-08-10 DIAGNOSIS — R220 Localized swelling, mass and lump, head: Secondary | ICD-10-CM | POA: Diagnosis not present

## 2020-08-10 DIAGNOSIS — Z96698 Presence of other orthopedic joint implants: Secondary | ICD-10-CM | POA: Diagnosis not present

## 2020-08-10 DIAGNOSIS — M7541 Impingement syndrome of right shoulder: Secondary | ICD-10-CM | POA: Diagnosis not present

## 2020-08-10 DIAGNOSIS — B351 Tinea unguium: Secondary | ICD-10-CM | POA: Diagnosis not present

## 2020-08-10 DIAGNOSIS — Z8679 Personal history of other diseases of the circulatory system: Secondary | ICD-10-CM | POA: Diagnosis not present

## 2020-08-10 DIAGNOSIS — R1310 Dysphagia, unspecified: Secondary | ICD-10-CM | POA: Diagnosis not present

## 2020-08-10 DIAGNOSIS — Z862 Personal history of diseases of the blood and blood-forming organs and certain disorders involving the immune mechanism: Secondary | ICD-10-CM | POA: Diagnosis not present

## 2020-08-10 DIAGNOSIS — F209 Schizophrenia, unspecified: Secondary | ICD-10-CM | POA: Diagnosis not present

## 2020-08-10 DIAGNOSIS — R443 Hallucinations, unspecified: Secondary | ICD-10-CM | POA: Diagnosis not present

## 2020-08-10 DIAGNOSIS — R0789 Other chest pain: Secondary | ICD-10-CM | POA: Diagnosis not present

## 2020-08-10 DIAGNOSIS — Z01419 Encounter for gynecological examination (general) (routine) without abnormal findings: Secondary | ICD-10-CM | POA: Diagnosis not present

## 2020-08-10 DIAGNOSIS — S299XXA Unspecified injury of thorax, initial encounter: Secondary | ICD-10-CM | POA: Diagnosis not present

## 2020-08-10 DIAGNOSIS — S0993XA Unspecified injury of face, initial encounter: Secondary | ICD-10-CM | POA: Diagnosis not present

## 2020-08-10 DIAGNOSIS — S00511A Abrasion of lip, initial encounter: Secondary | ICD-10-CM | POA: Diagnosis not present

## 2020-08-10 DIAGNOSIS — M79674 Pain in right toe(s): Secondary | ICD-10-CM | POA: Diagnosis not present

## 2020-08-10 DIAGNOSIS — M25551 Pain in right hip: Secondary | ICD-10-CM | POA: Diagnosis not present

## 2020-10-06 DIAGNOSIS — Z5189 Encounter for other specified aftercare: Secondary | ICD-10-CM | POA: Diagnosis not present

## 2020-10-06 DIAGNOSIS — R1310 Dysphagia, unspecified: Secondary | ICD-10-CM | POA: Diagnosis not present

## 2020-12-22 DIAGNOSIS — R519 Headache, unspecified: Secondary | ICD-10-CM | POA: Diagnosis not present

## 2020-12-22 DIAGNOSIS — S299XXA Unspecified injury of thorax, initial encounter: Secondary | ICD-10-CM | POA: Diagnosis not present

## 2020-12-22 DIAGNOSIS — F209 Schizophrenia, unspecified: Secondary | ICD-10-CM | POA: Diagnosis not present

## 2020-12-22 DIAGNOSIS — S0993XA Unspecified injury of face, initial encounter: Secondary | ICD-10-CM | POA: Diagnosis not present

## 2020-12-22 DIAGNOSIS — R220 Localized swelling, mass and lump, head: Secondary | ICD-10-CM | POA: Diagnosis not present

## 2021-02-28 ENCOUNTER — Other Ambulatory Visit: Payer: Self-pay

## 2021-02-28 ENCOUNTER — Ambulatory Visit (HOSPITAL_COMMUNITY)
Admission: EM | Admit: 2021-02-28 | Discharge: 2021-03-02 | Disposition: A | Discharge status: Home / Self Care | Payer: Medicare Other | Attending: Student | Admitting: Student

## 2021-02-28 DIAGNOSIS — Z79899 Other long term (current) drug therapy: Secondary | ICD-10-CM | POA: Insufficient documentation

## 2021-02-28 DIAGNOSIS — Z9151 Personal history of suicidal behavior: Secondary | ICD-10-CM | POA: Diagnosis not present

## 2021-02-28 DIAGNOSIS — F200 Paranoid schizophrenia: Secondary | ICD-10-CM | POA: Diagnosis not present

## 2021-02-28 DIAGNOSIS — Z20822 Contact with and (suspected) exposure to covid-19: Secondary | ICD-10-CM | POA: Diagnosis not present

## 2021-02-28 LAB — CBC WITH DIFFERENTIAL/PLATELET
Abs Immature Granulocytes: 0.01 10*3/uL (ref 0.00–0.07)
Basophils Absolute: 0 10*3/uL (ref 0.0–0.1)
Basophils Relative: 0 %
Eosinophils Absolute: 0.1 10*3/uL (ref 0.0–0.5)
Eosinophils Relative: 3 %
HCT: 34.8 % — ABNORMAL LOW (ref 36.0–46.0)
Hemoglobin: 11 g/dL — ABNORMAL LOW (ref 12.0–15.0)
Immature Granulocytes: 0 %
Lymphocytes Relative: 43 %
Lymphs Abs: 2.2 10*3/uL (ref 0.7–4.0)
MCH: 28.2 pg (ref 26.0–34.0)
MCHC: 31.6 g/dL (ref 30.0–36.0)
MCV: 89.2 fL (ref 80.0–100.0)
Monocytes Absolute: 0.6 10*3/uL (ref 0.1–1.0)
Monocytes Relative: 11 %
Neutro Abs: 2.2 10*3/uL (ref 1.7–7.7)
Neutrophils Relative %: 43 %
Platelets: 374 10*3/uL (ref 150–400)
RBC: 3.9 MIL/uL (ref 3.87–5.11)
RDW: 13.9 % (ref 11.5–15.5)
WBC: 5 10*3/uL (ref 4.0–10.5)
nRBC: 0 % (ref 0.0–0.2)

## 2021-02-28 LAB — POCT URINE DRUG SCREEN - MANUAL ENTRY (I-SCREEN)
POC Amphetamine UR: NOT DETECTED
POC Buprenorphine (BUP): NOT DETECTED
POC Cocaine UR: NOT DETECTED
POC Marijuana UR: NOT DETECTED
POC Methadone UR: NOT DETECTED
POC Methamphetamine UR: NOT DETECTED
POC Morphine: NOT DETECTED
POC Oxazepam (BZO): NOT DETECTED
POC Oxycodone UR: NOT DETECTED
POC Secobarbital (BAR): NOT DETECTED

## 2021-02-28 LAB — POCT PREGNANCY, URINE: Preg Test, Ur: NEGATIVE

## 2021-02-28 LAB — POC SARS CORONAVIRUS 2 AG: SARSCOV2ONAVIRUS 2 AG: NEGATIVE

## 2021-02-28 MED ORDER — BENZTROPINE MESYLATE 1 MG PO TABS
1.0000 mg | ORAL_TABLET | Freq: Two times a day (BID) | ORAL | Status: DC
  Administered 2021-02-28 – 2021-03-02 (×4): 1 mg via ORAL
  Filled 2021-02-28 (×4): qty 1

## 2021-02-28 MED ORDER — TRAZODONE HCL 50 MG PO TABS
50.0000 mg | ORAL_TABLET | Freq: Every evening | ORAL | Status: DC | PRN
  Administered 2021-03-01: 50 mg via ORAL
  Filled 2021-02-28: qty 1

## 2021-02-28 MED ORDER — CHLORPROMAZINE HCL 50 MG PO TABS
100.0000 mg | ORAL_TABLET | Freq: Two times a day (BID) | ORAL | Status: DC
  Administered 2021-02-28 – 2021-03-02 (×4): 100 mg via ORAL
  Filled 2021-02-28: qty 4
  Filled 2021-02-28: qty 2
  Filled 2021-02-28: qty 4
  Filled 2021-02-28 (×2): qty 2
  Filled 2021-02-28: qty 4

## 2021-02-28 MED ORDER — ACETAMINOPHEN 325 MG PO TABS: 650.0000 mg | ORAL_TABLET | Freq: Four times a day (QID) | ORAL | Status: DC | PRN

## 2021-02-28 MED ORDER — HYDROXYZINE HCL 25 MG PO TABS
25.0000 mg | ORAL_TABLET | Freq: Three times a day (TID) | ORAL | Status: DC | PRN
  Administered 2021-02-28 – 2021-03-01 (×2): 25 mg via ORAL
  Filled 2021-02-28 (×3): qty 1

## 2021-02-28 MED ORDER — ALUM & MAG HYDROXIDE-SIMETH 200-200-20 MG/5ML PO SUSP: 30.0000 mL | ORAL | Status: DC | PRN

## 2021-02-28 MED ORDER — MAGNESIUM HYDROXIDE 400 MG/5ML PO SUSP: 30.0000 mL | Freq: Every day | ORAL | Status: DC | PRN
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
## 2021-02-28 NOTE — ED Notes (Signed)
Patient admitted voluntarily to unit.  Ambulated independently.  Oriented to unit and offered food/fluids.  Patient denied SI, HI, AVH.

## 2021-02-28 NOTE — ED Notes (Signed)
Per [facility], unable to complete EKG due to artifact.  [place], [place] aware.
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
## 2021-03-01 DIAGNOSIS — F200 Paranoid schizophrenia: Secondary | ICD-10-CM | POA: Diagnosis not present

## 2021-03-01 DIAGNOSIS — Z79899 Other long term (current) drug therapy: Secondary | ICD-10-CM | POA: Diagnosis not present

## 2021-03-01 DIAGNOSIS — Z20822 Contact with and (suspected) exposure to covid-19: Secondary | ICD-10-CM | POA: Diagnosis not present

## 2021-03-01 DIAGNOSIS — Z9151 Personal history of suicidal behavior: Secondary | ICD-10-CM | POA: Diagnosis not present

## 2021-03-01 LAB — HEMOGLOBIN A1C
Hgb A1c MFr Bld: 5.1 % (ref 4.8–5.6)
Mean Plasma Glucose: 99.67 mg/dL

## 2021-03-01 LAB — COMPREHENSIVE METABOLIC PANEL
ALT: 18 U/L (ref 0–44)
AST: 20 U/L (ref 15–41)
Albumin: 3.8 g/dL (ref 3.5–5.0)
Alkaline Phosphatase: 43 U/L (ref 38–126)
Anion gap: 8 (ref 5–15)
BUN: 17 mg/dL (ref 6–20)
CO2: 29 mmol/L (ref 22–32)
Calcium: 9.3 mg/dL (ref 8.9–10.3)
Chloride: 102 mmol/L (ref 98–111)
Creatinine, Ser: 0.6 mg/dL (ref 0.44–1.00)
GFR, Estimated: >60 mL/min (ref >60)
Glucose, Bld: 75 mg/dL (ref 70–99)
Potassium: 3.4 mmol/L — ABNORMAL LOW (ref 3.5–5.1)
Sodium: 139 mmol/L (ref 135–145)
Total Bilirubin: 0.6 mg/dL (ref 0.3–1.2)
Total Protein: 7.3 g/dL (ref 6.5–8.1)

## 2021-03-01 LAB — TSH: TSH: 2.782 u[IU]/mL (ref 0.350–4.500)

## 2021-03-01 LAB — LIPID PANEL
Cholesterol: 138 mg/dL (ref 0–200)
HDL: 50 mg/dL (ref >40)
LDL Cholesterol: 74 mg/dL (ref 0–99)
Total CHOL/HDL Ratio: 2.8 RATIO
Triglycerides: 69 mg/dL (ref <150)
VLDL: 14 mg/dL (ref 0–40)

## 2021-03-01 LAB — RESP PANEL BY RT-PCR (FLU A&B, COVID) ARPGX2
Influenza A by PCR: NEGATIVE
Influenza B by PCR: NEGATIVE
SARS Coronavirus 2 by RT PCR: NEGATIVE

## 2021-03-01 MED ORDER — ASCORBIC ACID 500 MG PO TABS
500.0000 mg | ORAL_TABLET | Freq: Every day | ORAL | Status: DC
  Administered 2021-03-02: 500 mg via ORAL
  Filled 2021-03-01: qty 1

## 2021-03-01 MED ORDER — FAMOTIDINE 20 MG PO TABS: 20.0000 mg | ORAL_TABLET | Freq: Two times a day (BID) | ORAL | Status: DC | PRN

## 2021-03-01 MED ORDER — HALOPERIDOL 5 MG PO TABS
ORAL_TABLET | ORAL | Status: AC
  Filled 2021-03-01: qty 1

## 2021-03-01 MED ORDER — LORAZEPAM 1 MG PO TABS
2.0000 mg | ORAL_TABLET | Freq: Once | ORAL | Status: AC
  Administered 2021-03-01: 2 mg via ORAL
  Filled 2021-03-01: qty 2

## 2021-03-01 MED ORDER — HALOPERIDOL 5 MG PO TABS
5.0000 mg | ORAL_TABLET | Freq: Once | ORAL | Status: AC
  Administered 2021-03-01: 5 mg via ORAL
  Filled 2021-03-01: qty 1

## 2021-03-01 MED ORDER — POTASSIUM CHLORIDE CRYS ER 20 MEQ PO TBCR
20.0000 meq | EXTENDED_RELEASE_TABLET | Freq: Once | ORAL | Status: AC
  Administered 2021-03-01: 20 meq via ORAL
  Filled 2021-03-01: qty 1

## 2021-03-01 MED ORDER — LORAZEPAM 1 MG PO TABS
ORAL_TABLET | ORAL | Status: AC
  Filled 2021-03-01: qty 2

## 2021-03-01 MED ORDER — VITAMIN D 25 MCG (1000 UNIT) PO TABS
1000.0000 [IU] | ORAL_TABLET | Freq: Every day | ORAL | Status: DC
  Administered 2021-03-02: 1000 [IU] via ORAL
  Filled 2021-03-01 (×3): qty 1

## 2021-03-01 NOTE — ED Notes (Signed)
Pt sleeping@this time. Breathing even and unlabored. Will continue to monitor for safety 

## 2021-03-01 NOTE — ED Notes (Signed)
Pt refused new orders for Ativan and Haldol. Pt continues to cry out. Currently laying in the pull out bed with the covers over her head.

## 2021-03-01 NOTE — ED Notes (Signed)
Breakfast given.  

## 2021-03-01 NOTE — ED Notes (Signed)
Pt smacking herself in the face and continues to cry out. Crying getting louder and louder as she cries out.Received new orders per provider. Safety maintained and will continue to monitor.

## 2021-03-01 NOTE — ED Notes (Signed)
Pt. Is responding, to internal stimuli and making this constant ramble.

## 2021-03-01 NOTE — ED Notes (Signed)
Lunch given.

## 2021-03-01 NOTE — Progress Notes (Signed)
Patient had been calm on the unit,  Until after speaking with provider. Apparently after talking with provider and once provider was off of the unit the patient began to make noises and to cry in high pitch. Patient refused medications from another nurse as well.  Although patient is crying she is not a threat to others on the unit nor herself at this time. Staff will continue to monitor.

## 2021-03-01 NOTE — Progress Notes (Signed)
Patient received scheduled medications that were ordered after asking RN " What is the plan for me?"  Nurse informed patient that staff is trying to find an inpatient bed, patient then agreed to take medication. Nursing staff will continue to monitor.

## 2021-03-01 NOTE — Progress Notes (Signed)
Per [facility], no appropriate beds available at [facility].   Patient meets inpatient criteria per Reola Calkins, NP.  Patient was referred to the following facilities:  Service Provider Address Phone Fax  [facility]  [address] [place] [address] [phone]  [facility]  [address], [place] [address] [phone]  [facility]  [address], [place] [address] [phone]  [facility]  [address] [place] [address] [phone]  [facility]  [address] [place] [address] [phone]  [facility]  [address] [place] [address] [phone]  [facility]  [address] [place], [place] [address] [phone]  [facility]  [address] [place], [place] [address] [phone]  [facility]  [phone] [place] [address] [phone]  [facility]  [address] [place] [address] [phone]  [facility]  [address], [place] [address] [phone]  [facility]  [address] [place] [address] [phone]  [facility]  [address] [place] [address] [phone]  [facility]  [address], [place] [address] [phone]      CSW will continue to monitor for disposition.  Penni Homans, MSW, LCSW 03/01/2021 11:43 AM

## 2021-03-01 NOTE — ED Notes (Signed)
Pt sleeping at present, no distress noted, calm & cooperative.  Monitoring for safety. 

## 2021-03-01 NOTE — Progress Notes (Signed)
[facility] called, no available beds at this time.  Penni Homans, MSW, LCSW 03/01/2021 1:40 PM

## 2021-03-01 NOTE — ED Notes (Signed)
Pt crying out and stated to staff, "It's not me. It's [name]".
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
## 2021-03-01 NOTE — Progress Notes (Signed)
Patient now resting in bed, eyes are closed, respirations are even and unlabored at this time. Nursing staff will continue to monitor.

## 2021-03-01 NOTE — Progress Notes (Signed)
Patient now quiet on the unit, laying in bed with eyes closed. No objective signs of discomfort. Staff will continue to monitor.

## 2021-03-01 NOTE — ED Notes (Signed)
Patient is resting comfortably. 

## 2021-03-01 NOTE — ED Notes (Signed)
Patient sleeping. RR even and unlabored.

## 2021-03-01 NOTE — ED Provider Notes (Signed)
Behavioral Health Progress Note  Date and Time: 03/01/2021 3:10 PM Name: [name] [name] MRN:  [id number]  Subjective: Patient reports that she does not feel very stable today.  She states that she knows that somebody is after her.  She reports that they were after her when she was in [place] so she came to [place] again and since she has been here they continue to be after her.  She reports being compliant with her medications and states that she had her last dose of medications yesterday morning prior to coming to the hospital.  Patient continues to endorse having auditory hallucinations of someone telling her to kill herself and that she should stay in the hospital and if she gets out they will kill her.  Patient reports that the hallucinations are continuous.  Patient at times is calm and cooperative and able to have logical thought process about her medications, however then has episodes of making loud noises and talking to herself and her affect is labile.  Diagnosis:  Final diagnoses:  Schizophrenia, paranoid (HCC)    Total Time spent with patient: 30 minutes  Past Psychiatric History: Multiple hospitalizations, schizophrenia, schizoaffective disorder, MDD Past Medical History:  Past Medical History:  Diagnosis Date  . Arthritis   . Depression   . Hallucinations   . Paranoid schizophrenia (HCC)   . Schizoaffective disorder (HCC)    No past surgical history on file. Family History: No family history on file. Family Psychiatric  History: None reported Social History:  Social History   Substance and Sexual Activity  Alcohol Use Not Currently   Comment: She denies      Social History   Substance and Sexual Activity  Drug Use Not Currently   Comment: She denies     Social History   Socioeconomic History  . Marital status: Single    Spouse name: Not on file  . Number of children: Not on file  . Years of education: Not on file  . Highest education level: Not on file   Occupational History  . Occupation: Disability  Tobacco Use  . Smoking status: Never Smoker  . Smokeless tobacco: Never Used  Vaping Use  . Vaping Use: Never used  Substance and Sexual Activity  . Alcohol use: Not Currently    Comment: She denies   . Drug use: Not Currently    Comment: She denies   . Sexual activity: Not Currently  Other Topics Concern  . Not on file  Social History Narrative   Pt stated that she lives in [place], and that she lives alone.  Pt receives outpatient psychiatric resources through [employer].   Social Determinants of Health   Financial Resource Strain: Not on file  Food Insecurity: Not on file  Transportation Needs: Not on file  Physical Activity: Not on file  Stress: Not on file  Social Connections: Not on file   SDOH:  SDOH Screenings   Alcohol Screen: Low Risk   . Last Alcohol Screening Score (AUDIT): 0  Depression (PHQ2-9): Medium Risk  . PHQ-2 Score: 10  Financial Resource Strain: Not on file  Food Insecurity: Not on file  Housing: Not on file  Physical Activity: Not on file  Social Connections: Not on file  Stress: Not on file  Tobacco Use: Low Risk   . Smoking Tobacco Use: Never Smoker  . Smokeless Tobacco Use: Never Used  Transportation Needs: Not on file   Additional Social History:    Pain Medications: See [facility]  Prescriptions: See MAR Over the Counter: See MAR History of alcohol / drug use?: No history of alcohol / drug abuse                    Sleep: Fair  Appetite:  Fair  Current Medications:  Current Facility-Administered Medications  Medication Dose Route Frequency Provider Last Rate Last Admin  . acetaminophen (TYLENOL) tablet 650 mg  650 mg Oral Q6H PRN Jaclyn Shaggy, PA-C      . alum & mag hydroxide-simeth (MAALOX/MYLANTA) 200-200-20 MG/5ML suspension 30 mL  30 mL Oral Q4H PRN Melbourne Abts W, PA-C      . ascorbic acid (VITAMIN C) tablet 500 mg  500 mg Oral Daily Fifi Schindler B, FNP      .  benztropine (COGENTIN) tablet 1 mg  1 mg Oral BID Melbourne Abts W, PA-C   1 mg at 03/01/21 1242  . chlorproMAZINE (THORAZINE) tablet 100 mg  100 mg Oral BID Melbourne Abts W, PA-C   100 mg at 03/01/21 1246  . cholecalciferol (VITAMIN D) tablet 1,000 Units  1,000 Units Oral Daily Noemi Ishmael, Gerlene Burdock, FNP      . famotidine (PEPCID) tablet 20 mg  20 mg Oral BID PRN Monie Shere, Gerlene Burdock, FNP      . hydrOXYzine (ATARAX/VISTARIL) tablet 25 mg  25 mg Oral TID PRN Jaclyn Shaggy, PA-C   25 mg at 02/28/21 2229  . magnesium hydroxide (MILK OF MAGNESIA) suspension 30 mL  30 mL Oral Daily PRN Melbourne Abts W, PA-C      . traZODone (DESYREL) tablet 50 mg  50 mg Oral QHS PRN Jaclyn Shaggy, PA-C       Current Outpatient Medications  Medication Sig Dispense Refill  . benztropine (COGENTIN) 2 MG tablet Take 2 mg by mouth 2 (two) times daily.    . chlorproMAZINE (THORAZINE) 50 MG tablet Take 1 tablet by mouth in the morning and at bedtime.    . famotidine (PEPCID) 20 MG tablet Take 20 mg by mouth 2 (two) times daily as needed.    . haloperidol decanoate (HALDOL DECANOATE) 100 MG/ML injection Inject 100 mg into the muscle every 28 (twenty-eight) days.    . vitamin C (ASCORBIC ACID) 500 MG tablet Take 500 mg by mouth daily.    . Vitamin D, Cholecalciferol, 25 MCG (1000 UT) TABS Take 1 tablet by mouth daily.      Labs  Lab Results:  Admission on 02/28/2021  Component Date Value Ref Range Status  . SARS Coronavirus 2 by RT PCR 02/28/2021 NEGATIVE  NEGATIVE Final   Comment: (NOTE) SARS-CoV-2 target nucleic acids are NOT DETECTED.  The SARS-CoV-2 RNA is generally detectable in upper respiratory specimens during the acute phase of infection. The lowest concentration of SARS-CoV-2 viral copies this assay can detect is 138 copies/mL. A negative result does not preclude SARS-Cov-2 infection and should not be used as the sole basis for treatment or other patient management decisions. A negative result may occur with   improper specimen collection/handling, submission of specimen other than nasopharyngeal swab, presence of viral mutation(s) within the areas targeted by this assay, and inadequate number of viral copies(<138 copies/mL). A negative result must be combined with clinical observations, patient history, and epidemiological information. The expected result is Negative.  Fact Sheet for Patients:  BloggerCourse.com  Fact Sheet for Healthcare Providers:  SeriousBroker.it  This test is no  t yet approved or cleared by the [place] and  has been authorized for detection and/or diagnosis of SARS-CoV-2 by FDA under an Emergency Use Authorization (EUA). This EUA will remain  in effect (meaning this test can be used) for the duration of the COVID-19 declaration under Section 564(b)(1) of the Act, 21 U.S.C.section 360bbb-3(b)(1), unless the authorization is terminated  or revoked sooner.      . Influenza A by PCR 02/28/2021 NEGATIVE  NEGATIVE Final  . Influenza B by PCR 02/28/2021 NEGATIVE  NEGATIVE Final   Comment: (NOTE) The Xpert Xpress SARS-CoV-2/FLU/RSV plus assay is intended as an aid in the diagnosis of influenza from Nasopharyngeal swab specimens and should not be used as a sole basis for treatment. Nasal washings and aspirates are unacceptable for Xpert Xpress SARS-CoV-2/FLU/RSV testing.  Fact Sheet for Patients: BloggerCourse.com  Fact Sheet for Healthcare Providers: SeriousBroker.it  This test is not yet approved or cleared by the [place] FDA and has been authorized for detection and/or diagnosis of SARS-CoV-2 by FDA under an Emergency Use Authorization (EUA). This EUA will remain in effect (meaning this test can be used) for the duration of the COVID-19 declaration under Section 564(b)(1) of the Act, 21 U.S.C. section 360bbb-3(b)(1), unless  the authorization is terminated or revoked.  Performed at [facility], [address] [place], [place] [address]   . WBC 02/28/2021 5.0  4.0 - 10.5 K/uL Final  . RBC 02/28/2021 3.90  3.87 - 5.11 MIL/uL Final  . Hemoglobin 02/28/2021 11.0* 12.0 - 15.0 g/dL Final  . HCT 60/45/4098 34.8* 36.0 - 46.0 % Final  . MCV 02/28/2021 89.2  80.0 - 100.0 fL Final  . MCH 02/28/2021 28.2  26.0 - 34.0 pg Final  . MCHC 02/28/2021 31.6  30.0 - 36.0 g/dL Final  . RDW 11/91/4782 13.9  11.5 - 15.5 % Final  . Platelets 02/28/2021 374  150 - 400 K/uL Final  . nRBC 02/28/2021 0.0  0.0 - 0.2 % Final  . Neutrophils Relative % 02/28/2021 43  % Final  . Neutro Abs 02/28/2021 2.2  1.7 - 7.7 K/uL Final  . Lymphocytes Relative 02/28/2021 43  % Final  . Lymphs Abs 02/28/2021 2.2  0.7 - 4.0 K/uL Final  . Monocytes Relative 02/28/2021 11  % Final  . Monocytes Absolute 02/28/2021 0.6  0.1 - 1.0 K/uL Final  . Eosinophils Relative 02/28/2021 3  % Final  . Eosinophils Absolute 02/28/2021 0.1  0.0 - 0.5 K/uL Final  . Basophils Relative 02/28/2021 0  % Final  . Basophils Absolute 02/28/2021 0.0  0.0 - 0.1 K/uL Final  . Immature Granulocytes 02/28/2021 0  % Final  . Abs Immature Granulocytes 02/28/2021 0.01  0.00 - 0.07 K/uL Final   Performed at [facility], [address], [place], [place] [address]  . Sodium 02/28/2021 139  135 - 145 mmol/L Final  . Potassium 02/28/2021 3.4* 3.5 - 5.1 mmol/L Final  . Chloride 02/28/2021 102  98 - 111 mmol/L Final  . CO2 02/28/2021 29  22 - 32 mmol/L Final  . Glucose, Bld 02/28/2021 75  70 - 99 mg/dL Final   Glucose reference range applies only to samples taken after fasting for at least 8 hours.  . BUN 02/28/2021 17  6 - 20 mg/dL Final  . Creatinine, Ser 02/28/2021 0.60  0.44 - 1.00 mg/dL Final  . Calcium 30/86/5784 9.3  8.9 - 10.3 mg/dL Final  . Total Protein 02/28/2021 7.3  6.5 - 8.1 g/dL Final  . Albumin 09/81/1914 3.8  3.5 - 5.0 g/dL Final  . AST 78/29/5621 20  15  - 41 U/L Final  . ALT 02/28/2021 18  0 - 44 U/L Final  . Alkaline Phosphatase 02/28/2021 43  38 - 126 U/L Final  . Total Bilirubin 02/28/2021 0.6  0.3 - 1.2 mg/dL Final  . GFR, Estimated 02/28/2021 >60  >60 mL/min Final   Comment: (NOTE) Calculated using the CKD-EPI Creatinine Equation (2021)   . Anion gap 02/28/2021 8  5 - 15 Final   Performed at [facility], [address], [place], [place] [address]  . Hgb A1c MFr Bld 02/28/2021 5.1  4.8 - 5.6 % Final   Comment: (NOTE) Pre diabetes:          5.7%-6.4%  Diabetes:              >6.4%  Glycemic control for   <7.0% adults with diabetes   . Mean Plasma Glucose 02/28/2021 99.67  mg/dL Final   Performed at [facility], [address] [place], [place] [address]  . TSH 02/28/2021 2.782  0.350 - 4.500 uIU/mL Final   Comment: Performed by a 3rd Generation assay with a functional sensitivity of <=0.01 uIU/mL. Performed at [facility], [address], [place], [place] [address]   . POC Amphetamine UR 02/28/2021 None Detected  NONE DETECTED (Cut Off Level 1000 ng/mL) Final  . POC Secobarbital (BAR) 02/28/2021 None Detected  NONE DETECTED (Cut Off Level 300 ng/mL) Final  . POC Buprenorphine (BUP) 02/28/2021 None Detected  NONE DETECTED (Cut Off Level 10 ng/mL) Final  . POC Oxazepam (BZO) 02/28/2021 None Detected  NONE DETECTED (Cut Off Level 300 ng/mL) Final  . POC Cocaine UR 02/28/2021 None Detected  NONE DETECTED (Cut Off Level 300 ng/mL) Final  . POC Methamphetamine UR 02/28/2021 None Detected  NONE DETECTED (Cut Off Level 1000 ng/mL) Final  . POC Morphine 02/28/2021 None Detected  NONE DETECTED (Cut Off Level 300 ng/mL) Final  . POC Oxycodone UR 02/28/2021 None Detected  NONE DETECTED (Cut Off Level 100 ng/mL) Final  . POC Methadone UR 02/28/2021 None Detected  NONE DETECTED (Cut Off Level 300 ng/mL) Final  . POC Marijuana UR 02/28/2021 None Detected  NONE DETECTED (Cut Off Level 50 ng/mL) Final  . SARSCOV2ONAVIRUS 2  AG 02/28/2021 NEGATIVE  NEGATIVE Final   Comment: (NOTE) SARS-CoV-2 antigen NOT DETECTED.   Negative results are presumptive.  Negative results do not preclude SARS-CoV-2 infection and should not be used as the sole basis for treatment or other patient management decisions, including infection  control decisions, particularly in the presence of clinical signs and  symptoms consistent with COVID-19, or in those who have been in contact with the virus.  Negative results must be combined with clinical observations, patient history, and epidemiological information. The expected result is Negative.  Fact Sheet for Patients: https://www.jennings-kim.com/  Fact Sheet for Healthcare Providers: https://alexander-rogers.biz/  This test is not yet approved or cleared by the [place] FDA and  has been authorized for detection and/or diagnosis of SARS-CoV-2 by FDA under an Emergency Use Authorization (EUA).  This EUA will remain in effect (meaning this test can be used) for the duration of  the COV                          ID-19 declaration under Section 564(b)(1) of the Act, 21  U.S.C. section 360bbb-3(b)(1), unless the authorization is terminated or revoked sooner.    . Preg Test, Ur 02/28/2021 NEGATIVE  NEGATIVE Final   Comment:        THE SENSITIVITY OF THIS METHODOLOGY IS >24 mIU/mL   . Cholesterol 02/28/2021 138  0 - 200 mg/dL Final  . Triglycerides 02/28/2021 69  <150 mg/dL Final  . HDL 42/35/3614 50  >40 mg/dL Final  . Total CHOL/HDL Ratio 02/28/2021 2.8  RATIO Final  . VLDL 02/28/2021 14  0 - 40 mg/dL Final  . LDL Cholesterol 02/28/2021 74  0 - 99 mg/dL Final   Comment:        Total Cholesterol/HDL:CHD Risk Coronary Heart Disease Risk Table                     Men   Women  1/2 Average Risk   3.4   3.3  Average Risk       5.0   4.4  2 X Average Risk   9.6   7.1  3 X Average Risk  23.4   11.0        Use the calculated Patient Ratio above and the  CHD Risk Table to determine the patient's CHD Risk.        ATP III CLASSIFICATION (LDL):  <100     mg/dL   Optimal  431-540  mg/dL   Near or Above                    Optimal  130-159  mg/dL   Borderline  086-761  mg/dL   High  >950     mg/dL   Very High Performed at [facility], [address], [place], [place] [address]     Blood Alcohol level:  Lab Results  Component Value Date   ETH <10 05/19/2020   ETH <10 05/11/2020    Metabolic Disorder Labs: Lab Results  Component Value Date   HGBA1C 5.1 02/28/2021   MPG 99.67 02/28/2021   MPG 96.8 05/19/2020   Lab Results  Component Value Date   PROLACTIN 28.9 (H) 06/26/2018   Lab Results  Component Value Date   CHOL 138 02/28/2021   TRIG 69 02/28/2021   HDL 50 02/28/2021   CHOLHDL 2.8 02/28/2021   VLDL 14 02/28/2021   LDLCALC 74 02/28/2021   LDLCALC 121 (H) 05/19/2020    Therapeutic Lab Levels: No results found for: LITHIUM No results found for: VALPROATE No components found for:  CBMZ  Physical Findings   AIMS   Flowsheet Row [facility]) [facility] Admission (Discharged) from OP Visit [facility] Ad[facility] (Discharged) [facility] Admission (Discharged) from OP Visit [facility] Admission (Discharged) from OP Visit from 09/19/2019 [facility]  AIMS Total Score 0 0 0 0 0    AUDIT   Flowsheet Row [facility]) [facility] Admission (Discharged) [facility] Admission (Discharged) from OP Visit from 10/02/2019 [facility] Admission (Discharged) from OP Visit from 09/19/2019 [facility] Admission (Discharged) [facility]  Alcohol Use Disorder Identification Test Final Score (AUDIT) 0 0 0 0 0    PHQ2-9   Flowsheet  [facility]  PHQ-2 Total Score 2  PHQ-9 Total Score 10    Flowsheet [facility] Most recent reading at 03/01/2021  2:53 AM Ad[facility] (Discharged) [facility] Most recent reading at 05/19/2020  9:45 PM [facility] Most recent reading at 05/19/2020 12:30 PM  C-SSRS RISK CATEGORY Low Risk Error: Q7 should not be populated when Q6 is No Error: Q7 should not be populated when Q6 is No       Musculoskeletal  Strength & Muscle Tone: within normal limits Gait & Station: normal Patient leans: N/A  Psychiatric Specialty Exam  Presentation  General Appearance: Fairly Groomed; Casual  Eye Contact:Fair  Speech:Clear and Coherent; Normal Rate; Other (comment) (At times is yelling out and making noises)  Speech Volume:Decreased  Handedness:Right   Mood and Affect  Mood:Anxious; Labile  Affect:Labile   Thought Process  Thought Processes:Coherent  Descriptions of Associations:Tangential  Orientation:Full (Time, Place and Person)  Thought Content:Paranoid Ideation; Rumination; Tangential; Delusions  Diagnosis of Schizophrenia or Schizoaffective disorder in past: Yes  Duration of Psychotic Symptoms: Greater than six months   Hallucinations:Hallucinations: Auditory Description of Auditory Hallucinations: See HPI  Ideas of Reference:Delusions; Paranoia  Suicidal Thoughts:Suicidal Thoughts: No  Homicidal Thoughts:Homicidal Thoughts: No   Sensorium  Memory:Immediate Fair; Recent Fair; Remote Fair  Judgment:Fair  Insight:Fair   Executive Functions  Concentration:Good  Attention  Span:Good  Recall:Fair  Fund of Knowledge:Fair  Language:Fair   Psychomotor Activity  Psychomotor Activity:Psychomotor Activity: Normal   Assets  Assets:Communication Skills; Desire for Improvement; Financial Resources/Insurance; Housing; Physical Health; Social Support; Transportation   Sleep  Sleep:Sleep: Fair   Nutritional Assessment (For [facility] and [facility]) Has the patient had a weight loss or gain of 10 pounds or more in the last 3 months?: No Has the patient had a decrease in food intake/or appetite?: No Does the patient have dental problems?: No Does the patient have eating habits or behaviors that may be indicators of an eating disorder including binging or inducing vomiting?: No Has the patient recently lost weight without trying?: No Has the patient been eating poorly because of a decreased appetite?: No Malnutrition Screening Tool Score: 0    Physical Exam  Physical Exam Vitals and nursing note reviewed.  Constitutional:      Appearance: She is well-developed.  HENT:     Head: Normocephalic.  Eyes:     Pupils: Pupils are equal, round, and reactive to light.  Cardiovascular:     Rate and Rhythm: Normal rate.  Pulmonary:     Effort: Pulmonary effort is normal.  Musculoskeletal:        General: Normal range of motion.  Neurological:     Mental Status: She is alert and oriented to person, place, and time.    Review of Systems  Constitutional: Negative.   HENT: Negative.   Eyes: Negative.   Respiratory: Negative.   Cardiovascular: Negative.   Gastrointestinal: Negative.   Genitourinary: Negative.   Musculoskeletal: Negative.   Skin: Negative.   Neurological: Negative.   Endo/Heme/Allergies: Negative.   Psychiatric/Behavioral: Positive for hallucinations. The patient is nervous/anxious.    Blood pressure 120/72, pulse (!) 110, temperature 97.9 F (36.6 C), temperature source Oral, resp. rate 16, SpO2 100 %. There is no height or weight  on file to calculate BMI.  Treatment Plan Summary: Daily  contact with patient to assess and evaluate symptoms and progress in treatment and Medication management  Home medications have been restarted of Cogentin 1 mg p.o. twice daily, Thorazine 100 mg p.o. twice daily, Pepcid 20 mg p.o. twice daily as needed, Haldol decanoate 100 mg IM q. 28 days with last dose on approximately 02/08/2021 next dose is due next week. Continue to seek inpatient treatment  Maryfrances Bunnell, FNP 03/01/2021 3:10 PM

## 2021-03-02 DIAGNOSIS — Z9151 Personal history of suicidal behavior: Secondary | ICD-10-CM | POA: Diagnosis not present

## 2021-03-02 DIAGNOSIS — Z79899 Other long term (current) drug therapy: Secondary | ICD-10-CM | POA: Diagnosis not present

## 2021-03-02 DIAGNOSIS — F200 Paranoid schizophrenia: Secondary | ICD-10-CM | POA: Diagnosis not present

## 2021-03-02 DIAGNOSIS — Z20822 Contact with and (suspected) exposure to covid-19: Secondary | ICD-10-CM | POA: Diagnosis not present

## 2021-03-02 MED ORDER — HALOPERIDOL DECANOATE 100 MG/ML IM SOLN
100.0000 mg | INTRAMUSCULAR | Status: DC
  Administered 2021-03-02: 100 mg via INTRAMUSCULAR
  Filled 2021-03-02: qty 1

## 2021-03-02 MED ORDER — BENZTROPINE MESYLATE 1 MG PO TABS: 1.0000 mg | ORAL_TABLET | Freq: Two times a day (BID) | ORAL | 0 refills | Status: DC
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
## 2021-03-02 NOTE — ED Notes (Signed)
Pt sleeping at present, no distress noted.  Monitoring for safety. 

## 2021-03-02 NOTE — Progress Notes (Signed)
Pt has elevated pulse of 123. Pt refused recheck.

## 2021-03-02 NOTE — Progress Notes (Signed)
Pt under review at [facility].  Penni Homans, MSW, LCSW 03/02/2021 9:58 AM

## 2021-03-02 NOTE — Discharge Summary (Signed)
[name] to be D/C'd home per NP order. Discussed with the patient and all questions fully answered. An After Visit Summary was printed and given to the patient. Patient escorted out, and D/C home via safe transport. Dickie La  03/02/2021 1:14 PM

## 2021-03-02 NOTE — Progress Notes (Signed)
Pt is alert and cooperative. Pt is actively responding to internal stimuli. Pt is observed talking to herself and also making loud noises. Administered meds with no incident. Pt denies pain, SI, HI, and AVH at this time. Staff will monitor for pt's safety.

## 2021-03-02 NOTE — Discharge Instructions (Addendum)

## 2021-03-02 NOTE — ED Notes (Signed)
Patient responding to internal stimuli. Patient talking aloud in response to auditory hallucinations. Patient is humming and mutturing sounds aloud perhaps to block out voices.

## 2021-03-07 ENCOUNTER — Ambulatory Visit (INDEPENDENT_AMBULATORY_CARE_PROVIDER_SITE_OTHER)
Admission: EM | Admit: 2021-03-07 | Discharge: 2021-03-08 | Disposition: A | Discharge status: Home / Self Care | Payer: Medicare Other | Source: Home / Self Care

## 2021-03-07 ENCOUNTER — Ambulatory Visit (HOSPITAL_COMMUNITY)
Admission: EM | Admit: 2021-03-07 | Discharge: 2021-03-07 | Disposition: A | Discharge status: Home / Self Care | Payer: Medicare Other

## 2021-03-07 ENCOUNTER — Emergency Department (HOSPITAL_COMMUNITY)
Admission: EM | Admit: 2021-03-07 | Discharge: 2021-03-07 | Disposition: A | Discharge status: Home / Self Care | Payer: Medicare Other | Attending: Emergency Medicine | Admitting: Emergency Medicine

## 2021-03-07 ENCOUNTER — Other Ambulatory Visit: Payer: Self-pay

## 2021-03-07 DIAGNOSIS — Z5321 Procedure and treatment not carried out due to patient leaving prior to being seen by health care provider: Secondary | ICD-10-CM | POA: Diagnosis not present

## 2021-03-07 DIAGNOSIS — R112 Nausea with vomiting, unspecified: Secondary | ICD-10-CM | POA: Insufficient documentation

## 2021-03-07 DIAGNOSIS — F200 Paranoid schizophrenia: Secondary | ICD-10-CM | POA: Insufficient documentation

## 2021-03-07 DIAGNOSIS — R0689 Other abnormalities of breathing: Secondary | ICD-10-CM | POA: Diagnosis not present

## 2021-03-07 DIAGNOSIS — R109 Unspecified abdominal pain: Secondary | ICD-10-CM | POA: Diagnosis not present

## 2021-03-07 LAB — CBC WITH DIFFERENTIAL/PLATELET
Abs Immature Granulocytes: 0 10*3/uL (ref 0.00–0.07)
Basophils Absolute: 0 10*3/uL (ref 0.0–0.1)
Basophils Relative: 0 %
Eosinophils Absolute: 0.1 10*3/uL (ref 0.0–0.5)
Eosinophils Relative: 2 %
HCT: 34.6 % — ABNORMAL LOW (ref 36.0–46.0)
Hemoglobin: 11 g/dL — ABNORMAL LOW (ref 12.0–15.0)
Immature Granulocytes: 0 %
Lymphocytes Relative: 46 %
Lymphs Abs: 1.5 10*3/uL (ref 0.7–4.0)
MCH: 28.4 pg (ref 26.0–34.0)
MCHC: 31.8 g/dL (ref 30.0–36.0)
MCV: 89.4 fL (ref 80.0–100.0)
Monocytes Absolute: 0.4 10*3/uL (ref 0.1–1.0)
Monocytes Relative: 12 %
Neutro Abs: 1.3 10*3/uL — ABNORMAL LOW (ref 1.7–7.7)
Neutrophils Relative %: 40 %
Platelets: 310 10*3/uL (ref 150–400)
RBC: 3.87 MIL/uL (ref 3.87–5.11)
RDW: 14 % (ref 11.5–15.5)
WBC: 3.3 10*3/uL — ABNORMAL LOW (ref 4.0–10.5)
nRBC: 0 % (ref 0.0–0.2)

## 2021-03-07 LAB — COMPREHENSIVE METABOLIC PANEL
ALT: 15 U/L (ref 0–44)
AST: 20 U/L (ref 15–41)
Albumin: 3.9 g/dL (ref 3.5–5.0)
Alkaline Phosphatase: 42 U/L (ref 38–126)
Anion gap: 6 (ref 5–15)
BUN: 16 mg/dL (ref 6–20)
CO2: 28 mmol/L (ref 22–32)
Calcium: 9.4 mg/dL (ref 8.9–10.3)
Chloride: 104 mmol/L (ref 98–111)
Creatinine, Ser: 0.7 mg/dL (ref 0.44–1.00)
GFR, Estimated: >60 mL/min (ref >60)
Glucose, Bld: 87 mg/dL (ref 70–99)
Potassium: 3.8 mmol/L (ref 3.5–5.1)
Sodium: 138 mmol/L (ref 135–145)
Total Bilirubin: 0.7 mg/dL (ref 0.3–1.2)
Total Protein: 7 g/dL (ref 6.5–8.1)

## 2021-03-07 LAB — LIPASE, BLOOD: Lipase: 32 U/L (ref 11–51)

## 2021-03-07 LAB — I-STAT BETA HCG BLOOD, ED (NOT ORDERABLE): I-stat hCG, quantitative: <5 m[IU]/mL (ref <5)

## 2021-03-07 NOTE — ED Notes (Signed)
Pt seen ripping wrist band off her arm and going out the door.

## 2021-03-07 NOTE — BH Assessment (Signed)
Clinician attempted to triage pt but she was lying asleep on the couch in her assessment room. Despite numerous attempts, pt could not be aroused. Clinician inquired as to whether pt had taken anything/used any substances; pt mumbled something about taking her medication. TTS will attempt to triage at a later time.

## 2021-03-07 NOTE — ED Notes (Signed)
Pt refusing to sign MSE document

## 2021-03-07 NOTE — Discharge Summary (Signed)
Pt left after being triaged and before being seen by provider.

## 2021-03-07 NOTE — ED Provider Notes (Addendum)
Emergency Medicine Provider Triage Evaluation Note  [name] [name] 45 y.o. female was evaluated in triage.  Pt complains of nausea which started today.  Also reports she has had similar abdominal pain.  No fevers, vomiting.  Denies any dysuria, hematuria, diarrhea.  Review of Systems  Positive: Abdominal pain, nausea Negative: Vomiting, fever, urinary complaints.  Physical Exam  BP 134/82   Pulse 70   Temp 98.2 F (36.8 C) (Oral)   Resp 18   Ht 5\' 4"  (1.626 m)   Wt 65.8 kg   SpO2 100%   BMI 24.89 kg/m  Gen:   Awake, no distress   HEENT:  Atraumatic  Resp:  Normal effort  Cardiac:  Normal rate Abd:   Nondistended, mild tenderness noted to the mild suprapubic region. MSK:   Moves extremities without difficulty Neuro:  Speech clear, rapid. Alert and oriented x 3  Medical Decision Making  Medically screening exam initiated at 3:55 AM.  Appropriate orders placed.  [name] [name] was informed that the remainder of the evaluation will be completed by another provider, this initial triage assessment does not replace that evaluation, and the importance of remaining in the ED until their evaluation is complete.  Clinical Impression  abd pain, nausea   Portions of this note were generated with Dragon dictation software. Dictation errors may occur despite best attempts at proofreading.     [name], PA-C 03/07/21 1502    05/07/21, PA-C 03/07/21 1507    05/07/21, MD 03/08/21 [phone]

## 2021-03-07 NOTE — ED Notes (Signed)
When NT attempted to get vitals, pt keeps throwing the pulse ox. Pt quick to become agitated.

## 2021-03-07 NOTE — ED Triage Notes (Signed)
Pt bib ems with reports of abdominal pain and emesis. Denies fevers, emesis or diarrhea. Denies pain with urination.

## 2021-03-08 ENCOUNTER — Encounter (HOSPITAL_COMMUNITY): Payer: Self-pay | Admitting: Registered Nurse

## 2021-03-08 ENCOUNTER — Ambulatory Visit (HOSPITAL_COMMUNITY)
Admission: EM | Admit: 2021-03-08 | Discharge: 2021-03-09 | Disposition: A | Discharge status: Other Acute Inpatient Hospital | Payer: Medicare Other | Attending: Psychiatry | Admitting: Psychiatry

## 2021-03-08 ENCOUNTER — Other Ambulatory Visit: Payer: Self-pay

## 2021-03-08 DIAGNOSIS — Z20822 Contact with and (suspected) exposure to covid-19: Secondary | ICD-10-CM | POA: Insufficient documentation

## 2021-03-08 DIAGNOSIS — F200 Paranoid schizophrenia: Secondary | ICD-10-CM

## 2021-03-08 DIAGNOSIS — Z046 Encounter for general psychiatric examination, requested by authority: Secondary | ICD-10-CM | POA: Diagnosis present

## 2021-03-08 LAB — POCT PREGNANCY, URINE
Preg Test, Ur: NEGATIVE
Preg Test, Ur: NEGATIVE

## 2021-03-08 LAB — POCT URINE DRUG SCREEN - MANUAL ENTRY (I-SCREEN)
POC Amphetamine UR: NOT DETECTED
POC Buprenorphine (BUP): NOT DETECTED
POC Cocaine UR: NOT DETECTED
POC Marijuana UR: NOT DETECTED
POC Methadone UR: NOT DETECTED
POC Methamphetamine UR: NOT DETECTED
POC Morphine: NOT DETECTED
POC Oxazepam (BZO): NOT DETECTED
POC Oxycodone UR: NOT DETECTED
POC Secobarbital (BAR): NOT DETECTED

## 2021-03-08 LAB — CBC WITH DIFFERENTIAL/PLATELET
Abs Immature Granulocytes: 0.01 10*3/uL (ref 0.00–0.07)
Basophils Absolute: 0 10*3/uL (ref 0.0–0.1)
Basophils Relative: 1 %
Eosinophils Absolute: 0.1 10*3/uL (ref 0.0–0.5)
Eosinophils Relative: 2 %
HCT: 35.1 % — ABNORMAL LOW (ref 36.0–46.0)
Hemoglobin: 11.3 g/dL — ABNORMAL LOW (ref 12.0–15.0)
Immature Granulocytes: 0 %
Lymphocytes Relative: 48 %
Lymphs Abs: 2 10*3/uL (ref 0.7–4.0)
MCH: 28.7 pg (ref 26.0–34.0)
MCHC: 32.2 g/dL (ref 30.0–36.0)
MCV: 89.1 fL (ref 80.0–100.0)
Monocytes Absolute: 0.4 10*3/uL (ref 0.1–1.0)
Monocytes Relative: 9 %
Neutro Abs: 1.6 10*3/uL — ABNORMAL LOW (ref 1.7–7.7)
Neutrophils Relative %: 40 %
Platelets: 325 10*3/uL (ref 150–400)
RBC: 3.94 MIL/uL (ref 3.87–5.11)
RDW: 14 % (ref 11.5–15.5)
WBC: 4 10*3/uL (ref 4.0–10.5)
nRBC: 0 % (ref 0.0–0.2)

## 2021-03-08 LAB — RESP PANEL BY RT-PCR (FLU A&B, COVID) ARPGX2
Influenza A by PCR: NEGATIVE
Influenza B by PCR: NEGATIVE
SARS Coronavirus 2 by RT PCR: NEGATIVE

## 2021-03-08 LAB — POC SARS CORONAVIRUS 2 AG -  ED: SARS Coronavirus 2 Ag: NEGATIVE

## 2021-03-08 MED ORDER — BENZTROPINE MESYLATE 1 MG PO TABS
1.0000 mg | ORAL_TABLET | Freq: Two times a day (BID) | ORAL | Status: DC
  Administered 2021-03-08: 1 mg via ORAL
  Filled 2021-03-08 (×2): qty 1

## 2021-03-08 MED ORDER — FAMOTIDINE 20 MG PO TABS: 20.0000 mg | ORAL_TABLET | Freq: Two times a day (BID) | ORAL | Status: DC | PRN

## 2021-03-08 MED ORDER — MAGNESIUM HYDROXIDE 400 MG/5ML PO SUSP: 30.0000 mL | Freq: Every day | ORAL | Status: DC | PRN

## 2021-03-08 MED ORDER — ALUM & MAG HYDROXIDE-SIMETH 200-200-20 MG/5ML PO SUSP: 30.0000 mL | ORAL | Status: DC | PRN

## 2021-03-08 MED ORDER — CHLORPROMAZINE HCL 25 MG PO TABS
50.0000 mg | ORAL_TABLET | Freq: Two times a day (BID) | ORAL | Status: DC
  Administered 2021-03-08: 50 mg via ORAL
  Filled 2021-03-08: qty 2

## 2021-03-08 MED ORDER — ACETAMINOPHEN 325 MG PO TABS
650.0000 mg | ORAL_TABLET | Freq: Four times a day (QID) | ORAL | Status: DC | PRN
  Administered 2021-03-08: 650 mg via ORAL
  Filled 2021-03-08: qty 2

## 2021-03-08 NOTE — BH Assessment (Addendum)
Clinician attempted to do assessment on patient.  Pt is still too somnolent to be assessed.  Pt did not react to name being called.  Clinician was informed by End Ajibola, NP that she got the same (non) reaction.  Will attempt again.

## 2021-03-08 NOTE — ED Notes (Signed)
Dash called for 1 stat pick up

## 2021-03-08 NOTE — Discharge Instructions (Addendum)
Discharge to [facility] 
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
## 2021-03-08 NOTE — ED Notes (Signed)
Patient has taken medications and is lying in bed eating a peanut butter and jelly sandwich, drinking water and grape juice

## 2021-03-08 NOTE — ED Notes (Signed)
Patient denies pain and is resting comfortably.  

## 2021-03-08 NOTE — ED Notes (Signed)
Patient is currently i
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
## 2021-03-08 NOTE — ED Notes (Signed)
Patient is currently in bed eating chips and drinking grape juice. Environment secured
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
## 2021-03-08 NOTE — ED Provider Notes (Signed)
Behavioral Health Urgent Care Medical Screening Exam  Patient Name: [name] [name] MRN: [id number] Date of Evaluation: 03/08/21 Chief Complaint:   Diagnosis:  Final diagnoses:  Paranoid schizophrenia (HCC)    History of Present illness: [name] [name] is a 45 y.o. female. Patient presented to [facility] via Patent examiner. Patient presented for unknown reason and uncooperative with assessment. Patient requested several time to be left alone and that she wants to sleep,  Patient then proceeded to sleeping. Patient would repeatedly get up, walk to use the restroom, and request different items from staff then return to sleeping. Patient was informed that if she continues to be uncooperative she will have to leave assessment room and be  discharge. Patient voiced understanding and then return to sleep. Patient assessed face to face by this NP. Patient is drowsy but easy to arouse, oriented X4, patient is uncooperative, patient denies any psychiatric concerns, she doesn't appear to be actively psychotic, she does not appear to be responding to any internal or external stimuli. Patient denies SOB, chest pain, dizziness, headache, Gi/GU symptoms.     Psychiatric Specialty Exam  Presentation  General Appearance:Appropriate for Environment  Eye Contact:Fair  Speech:Clear and Coherent  Speech Volume:Normal  Handedness:Right   Mood and Affect  Mood:Irritable  Affect:Congruent   Thought Process  Thought Processes:Coherent; Goal Directed  Descriptions of Associations:Intact  Orientation:Full (Time, Place and Person)  Thought Content:WDL  Diagnosis of Schizophrenia or Schizoaffective disorder in past: No  Duration of Psychotic Symptoms: Greater than six months  Hallucinations:None God and my sister are telling me to hurt myself. He messes with me real bad when I dont have my medicine See HPI  Ideas of Reference:None  Suicidal Thoughts:No Without Plan; With Intent  Homicidal  Thoughts:No   Sensorium  Memory:Immediate Good; Recent Good; Remote Good  Judgment:-- (unable to assess)  Insight:Fair   Executive Functions  Concentration:-- (unable to assess)  Attention Span:Fair  Recall:-- (unable to assess)  Fund of Knowledge:-- (unable to assess)  Language:Good   Psychomotor Activity  Psychomotor Activity:Normal   Assets  Assets:Physical Health   Sleep  Sleep:Fair  Number of hours: 5   No data recorded  Physical Exam: Physical Exam Constitutional:      General: She is not in acute distress.    Appearance: She is not toxic-appearing.  HENT:     Head: Normocephalic.  Eyes:     General:        Right eye: No discharge.        Left eye: No discharge.  Cardiovascular:     Rate and Rhythm: Normal rate.  Musculoskeletal:        General: Normal range of motion.     Cervical back: Normal range of motion.  Neurological:     Mental Status: She is oriented to person, place, and time.  Psychiatric:        Mood and Affect: Mood normal.        Speech: Speech normal.        Behavior: Behavior is uncooperative.    Review of Systems  Constitutional: Negative for chills and fever.  Cardiovascular: Negative for chest pain and palpitations.  Gastrointestinal: Negative for abdominal pain, nausea and vomiting.  Neurological: Negative for tingling and headaches.   Pulse 87, temperature 98.3 F (36.8 C), resp. rate (!) 87, SpO2 98 %. There is no height or weight on file to calculate BMI.  Musculoskeletal: Strength & Muscle Tone: within normal limits Gait & Station: normal Patient leans: Right  [facility] MSE Discharge Disposition for Follow up and Recommendations: Based on my evaluation the patient does not appear to have an emergency medical condition and can be discharged with resources and follow up care in outpatient services for Medication Management and Individual Therapy   Patient is uncooperative with assessment. She repeatedly told  this writer to leave her alone and that she wants to sleep. Patient denies any psychiatric concerns at this time. Patient is discharge and inform to return if she has psychiatric complaint.    Maricela Bo, NP 03/08/2021, 5:48 AM

## 2021-03-08 NOTE — BH Assessment (Signed)
Clinician attempted to complete pt's MH Assessment. Clinician called pt's name numerous times but pt was asleep in the assessment room. After calling pt's name louder, pt opened her eyes, realized she was drooling, asked for a Kleenex, found the Kleenex beside her head, wiped up the drool along her mouth and off of the couch, and went back to sleep. Clinician inquired as to why pt was at the [facility] tonight and pt mumbled a response, though it was incoherent. Clinician asked again but pt was already back asleep and was unable to be aroused. TTS will attempt to complete pt's MH Assessment with pt at a later time.

## 2021-03-08 NOTE — Discharge Instructions (Signed)

## 2021-03-08 NOTE — ED Provider Notes (Signed)
Behavioral Health Admission [facility] & OBS)  Date: 03/08/21 Patient Name: [name] [name] MRN: [id number] Chief Complaint:  Chief Complaint  Patient presents with  . Delusional   Chief Complaint/Presenting Problem: Pt IVC by current therapist for delusional behaviors  Diagnoses:  Final diagnoses:  Schizophrenia, paranoid type ([place])    HPI: [name] [name], 45 y.o., female patient presents to [facility] as a walk in via Event organ[facility] under IVC petition by [facility] staff where patient has outpatient psychiatric services.   Patient seen face to face by this provider, consulted with Dr. Ernie Hew; and chart reviewed on 03/08/21.  On evaluation [name] [name] reports she can't go home because someone is after her.  States they are trying to rape and kill her.  States she has a friend that will be here by the weekend to help her.  Patient informed that she could not stay in the hospital or urgent care while awaiting her friend to come from [place] to stay with her.  Patient states "I'm not psychotic.  Someone is after me."  Patient asked several times who was after her and finally stated "[name]."   Patient recently discharged from [facility] and IVC states that patient was hospitalized in [place] for 10 months just getting out on 02/23/21 when she returned to [place] and shortly after presented to [facility].  Patient is anxious and stating she is afraid to go home because "he will make her hurt herself."   PHQ 2-9:  [facility] ED from 05/11/2020 in [place]  Thoughts that you would be better off dead, or of hurting yourself in some way More than half the days  PHQ-9 Total Score 10      [facility] from 03/08/2021 in [facility] ED from 02/28/2021 in [facility] Admission (Discharged) from 05/19/2020 in [place] [facility]  C-SSRS RISK CATEGORY No Risk Low Risk Error: Q7  should not be populated when Q6 is No       Total Time spent with patient: 45 minutes  Musculoskeletal  Strength & Muscle Tone: within normal limits Gait & Station: normal Patient leans: N/A  Psychiatric Specialty Exam  Presentation General Appearance: Appropriate for Environment; Casual  Eye Contact:Good  Speech:Clear and Coherent; Normal Rate  Speech Volume:Normal  Handedness:Right   Mood and Affect  Mood:Anxious  Affect:Congruent   Thought Process  Thought Processes:Coherent  Descriptions of Associations:Tangential  Orientation:Full (Time, Place and Person)  Thought Content:Tangential; Paranoid Ideation  Diagnosis of Schizophrenia or Schizoaffective disorder in past: Yes  Duration of Psychotic Symptoms: Greater than six months  Hallucinations:Hallucinations: None (Denies at this time)  Ideas of Reference:Paranoia (Patient states that [employer] is following her trying to kill her)  Suicidal Thoughts:Suicidal Thoughts: No  Homicidal Thoughts:Homicidal Thoughts: No   Sensorium  Memory:Immediate Good; Recent Good  Judgment:Fair  Insight:Fair; Present   Executive Functions  Concentration:Fair  Attention Span:Fair  [place]  Language:Good   Psychomotor Activity  Psychomotor Activity:Psychomotor Activity: Normal   Assets  Assets:Communication Skills; Desire for Improvement; Housing   Sleep  Sleep:Sleep: Fair   Nutritional Assessment (For OBS and FBC admissions only) Has the patient had a weight loss or gain of 10 pounds or more in the last 3 months?: No Has the patient had a decrease in food intake/or appetite?: No Does the patient have dental problems?: No Does the patient have eating habits or  behaviors that may be indicators of an eating disorder including binging or inducing vomiting?: No Has the patient recently lost weight without trying?: No Has the patient been eating poorly because of a  decreased appetite?: No Malnutrition Screening Tool Score: 0    Physical Exam Vitals and nursing note reviewed. Exam conducted with a chaperone present.  Constitutional:      General: She is not in acute distress.    Appearance: Normal appearance. She is not ill-appearing.  Eyes:     Pupils: Pupils are equal, round, and reactive to light.  Cardiovascular:     Rate and Rhythm: Normal rate.  Pulmonary:     Effort: Pulmonary effort is normal.  Musculoskeletal:        General: Normal range of motion.     Cervical back: Normal range of motion.  Skin:    General: Skin is warm and dry.  Neurological:     Mental Status: She is alert and oriented to person, place, and time.  Psychiatric:        Attention and Perception: Attention and perception normal. She does not perceive auditory or visual hallucinations.        Mood and Affect: Mood is anxious.        Speech: Speech normal.        Behavior: Behavior normal. Behavior is cooperative.        Thought Content: Thought content is paranoid. Thought content is not delusional. Thought content does not include homicidal or suicidal ideation.        Cognition and Memory: Cognition and memory normal.        Judgment: Judgment is impulsive.    Review of Systems  Constitutional: Negative.   HENT: Negative.   Eyes: Negative.   Respiratory: Negative.   Cardiovascular: Negative.   Gastrointestinal: Negative.   Genitourinary: Negative.   Musculoskeletal: Negative.   Skin: Negative.   Neurological: Negative.   Endo/Heme/Allergies: Negative.   Psychiatric/Behavioral: Positive for depression. Negative for suicidal ideas. Hallucinations: Denies at this time. The patient is nervous/anxious.        Patient is paranoid.  States when she went home today.  Jesus made her hit herself in the mouth "My lip was bleeding.  He is trying to kill me."      Blood pressure (!) 141/89, pulse (!) 111, temperature 98.2 F (36.8 C), temperature source Tympanic,  resp. rate 16, SpO2 99 %. There is no height or weight on file to calculate BMI.  Past Psychiatric History: Schizophrenia   Is the patient at risk to self? Yes  Has the patient been a risk to self in the past 6 months? Yes .    Has the patient been a risk to self within the distant past? Yes   Is the patient a risk to others? No   Has the patient been a risk to others in the past 6 months? No   Has the patient been a risk to others within the distant past? No   Past Medical History:  Past Medical History:  Diagnosis Date  . Arthritis   . Depression   . Hallucinations   . Paranoid schizophrenia ([place])   . Schizoaffective disorder ([place])    History reviewed. No pertinent surgical history.  Family History: History reviewed. No pertinent family history.  Social History:  Social History   Socioeconomic History  . Marital status: Single    Spouse name: Not on file  . Number of children: Not on file  .  Years of education: Not on file  . Highest education level: Not on file  Occupational History  . Occupation: Disability  Tobacco Use  . Smoking status: Never Smoker  . Smokeless tobacco: Never Used  Vaping Use  . Vaping Use: Never used  Substance and Sexual Activity  . Alcohol use: Not Currently    Comment: She denies   . Drug use: Not Currently    Comment: She denies   . Sexual activity: Not Currently  Other Topics Concern  . Not on file  Social History Narrative   Pt stated that she lives in [place], and that she lives alone.  Pt receives outpatient psychiatric resources through [facility].   Social Determinants of Health   Financial Resource Strain: Not on file  Food Insecurity: Not on file  Transportation Needs: Not on file  Physical Activity: Not on file  Stress: Not on file  Social Connections: Not on file  Intimate Partner Violence: Not on file    SDOH:  SDOH Screenings   Alcohol Screen: Low Risk   . Last Alcohol Screening Score (AUDIT): 0   Depression (PHQ2-9): Medium Risk  . PHQ-2 Score: 10  Financial Resource Strain: Not on file  Food Insecurity: Not on file  Housing: Not on file  Physical Activity: Not on file  Social Connections: Not on file  Stress: Not on file  Tobacco Use: Low Risk   . Smoking Tobacco Use: Never Smoker  . Smokeless Tobacco Use: Never Used  Transportation Needs: Not on file    Last Labs:  Admission on 03/07/2021, Discharged on 03/07/2021  Component Date Value Ref Range Status  . Sodium 03/07/2021 138  135 - 145 mmol/L Final  . Potassium 03/07/2021 3.8  3.5 - 5.1 mmol/L Final  . Chloride 03/07/2021 104  98 - 111 mmol/L Final  . CO2 03/07/2021 28  22 - 32 mmol/L Final  . Glucose, Bld 03/07/2021 87  70 - 99 mg/dL Final   Glucose reference range applies only to samples taken after fasting for at least 8 hours.  . BUN 03/07/2021 16  6 - 20 mg/dL Final  . Creatinine, Ser 03/07/2021 0.70  0.44 - 1.00 mg/dL Final  . Calcium 03/07/2021 9.4  8.9 - 10.3 mg/dL Final  . Total Protein 03/07/2021 7.0  6.5 - 8.1 g/dL Final  . Albumin 03/07/2021 3.9  3.5 - 5.0 g/dL Final  . AST 03/07/2021 20  15 - 41 U/L Final  . ALT 03/07/2021 15  0 - 44 U/L Final  . Alkaline Phosphatase 03/07/2021 42  38 - 126 U/L Final  . Total Bilirubin 03/07/2021 0.7  0.3 - 1.2 mg/dL Final  . GFR, Estimated 03/07/2021 >60  >60 mL/min Final   Comment: (NOTE) Calculated using the CKD-EPI Creatinine Equation (2021)   . Anion gap 03/07/2021 6  5 - 15 Final   Performed at [facility], [place] [address], [place], [place] [address]  . WBC 03/07/2021 3.3* 4.0 - 10.5 K/uL Final  . RBC 03/07/2021 3.87  3.87 - 5.11 MIL/uL Final  . Hemoglobin 03/07/2021 11.0* 12.0 - 15.0 g/dL Final  . HCT 03/07/2021 34.6* 36.0 - 46.0 % Final  . MCV 03/07/2021 89.4  80.0 - 100.0 fL Final  . MCH 03/07/2021 28.4  26.0 - 34.0 pg Final  . MCHC 03/07/2021 31.8  30.0 - 36.0 g/dL Final  . RDW 03/07/2021 14.0  11.5 - 15.5 % Final  . Platelets 03/07/2021 310   150 - 400  K/uL Final  . nRBC 03/07/2021 0.0  0.0 - 0.2 % Final  . Neutrophils Relative % 03/07/2021 40  % Final  . Neutro Abs 03/07/2021 1.3* 1.7 - 7.7 K/uL Final  . Lymphocytes Relative 03/07/2021 46  % Final  . Lymphs Abs 03/07/2021 1.5  0.7 - 4.0 K/uL Final  . Monocytes Relative 03/07/2021 12  % Final  . Monocytes Absolute 03/07/2021 0.4  0.1 - 1.0 K/uL Final  . Eosinophils Relative 03/07/2021 2  % Final  . Eosinophils Absolute 03/07/2021 0.1  0.0 - 0.5 K/uL Final  . Basophils Relative 03/07/2021 0  % Final  . Basophils Absolute 03/07/2021 0.0  0.0 - 0.1 K/uL Final  . Immature Granulocytes 03/07/2021 0  % Final  . Abs Immature Granulocytes 03/07/2021 0.00  0.00 - 0.07 K/uL Final   Performed at [facility], [place] [address], [place], [place] [address]  . Lipase 03/07/2021 32  11 - 51 U/L Final   Performed at [facility], [place] [address], [place],  [address]  Admission on 03/07/2021, Discharged on 03/07/2021  Component Date Value Ref Range Status  . I-stat hCG, quantitative 03/07/2021 <5.0  <5 mIU/mL Final  . Comment 3 03/07/2021          Final   Comment:   GEST. AGE      CONC.  (mIU/mL)   <=1 WEEK        5 - 50     2 WEEKS       50 - 500     3 WEEKS       100 - 10,000     4 WEEKS     1,000 - 30,000        FEMALE AND NON-PREGNANT FEMALE:     LESS THAN 5 mIU/mL   Admission on 02/28/2021, Discharged on 03/02/2021  Component Date Value Ref Range Status  . SARS Coronavirus 2 by RT PCR 02/28/2021 NEGATIVE  NEGATIVE Final   Comment: (NOTE) SARS-CoV-2 target nucleic acids are NOT DETECTED.  The SARS-CoV-2 RNA is generally detectable in upper respiratory specimens during the acute phase of infection. The lowest concentration of SARS-CoV-2 viral copies this assay can detect is 138 copies/mL. A negative result does not preclude SARS-Cov-2 infection and should not be used as the sole basis for treatment or other patient management decisions. A negative result may occur  with  improper specimen collection/handling, submission of specimen other than nasopharyngeal swab, presence of viral mutation(s) within the areas targeted by this assay, and inadequate number of viral copies(<138 copies/mL). A negative result must be combined with clinical observations, patient history, and epidemiological information. The expected result is Negative.  Fact Sheet for Patients:  EntrepreneurPulse.com.au  Fact Sheet for Healthcare Providers:  IncredibleEmployment.be  This test is no                          t yet approved or cleared by the [place] FDA and  has been authorized for detection and/or diagnosis of SARS-CoV-2 by FDA under an Emergency Use Authorization (EUA). This EUA will remain  in effect (meaning this test can be used) for the duration of the COVID-19 declaration under Section 564(b)(1) of the Act, 21 U.S.C.section 360bbb-3(b)(1), unless the authorization is terminated  or revoked sooner.      . Influenza A by PCR 02/28/2021 NEGATIVE  NEGATIVE Final  . Influenza B by PCR 02/28/2021 NEGATIVE  NEGATIVE Final  Comment: (NOTE) The Xpert Xpress SARS-CoV-2/FLU/RSV plus assay is intended as an aid in the diagnosis of influenza from Nasopharyngeal swab specimens and should not be used as a sole basis for treatment. Nasal washings and aspirates are unacceptable for Xpert Xpress SARS-CoV-2/FLU/RSV testing.  Fact Sheet for Patients: EntrepreneurPulse.com.au  Fact Sheet for Healthcare Providers: IncredibleEmployment.be  This test is not yet approved or cleared by the [place] FDA and has been authorized for detection and/or diagnosis of SARS-CoV-2 by FDA under an Emergency Use Authorization (EUA). This EUA will remain in effect (meaning this test can be used) for the duration of the COVID-19 declaration under Section 564(b)(1) of the Act, 21 U.S.C. section 360bbb-3(b)(1),  unless the authorization is terminated or revoked.  Performed at [facility], [place] [address] [place], [place] [address]   . WBC 02/28/2021 5.0  4.0 - 10.5 K/uL Final  . RBC 02/28/2021 3.90  3.87 - 5.11 MIL/uL Final  . Hemoglobin 02/28/2021 11.0* 12.0 - 15.0 g/dL Final  . HCT 02/28/2021 34.8* 36.0 - 46.0 % Final  . MCV 02/28/2021 89.2  80.0 - 100.0 fL Final  . MCH 02/28/2021 28.2  26.0 - 34.0 pg Final  . MCHC 02/28/2021 31.6  30.0 - 36.0 g/dL Final  . RDW 02/28/2021 13.9  11.5 - 15.5 % Final  . Platelets 02/28/2021 374  150 - 400 K/uL Final  . nRBC 02/28/2021 0.0  0.0 - 0.2 % Final  . Neutrophils Relative % 02/28/2021 43  % Final  . Neutro Abs 02/28/2021 2.2  1.7 - 7.7 K/uL Final  . Lymphocytes Relative 02/28/2021 43  % Final  . Lymphs Abs 02/28/2021 2.2  0.7 - 4.0 K/uL Final  . Monocytes Relative 02/28/2021 11  % Final  . Monocytes Absolute 02/28/2021 0.6  0.1 - 1.0 K/uL Final  . Eosinophils Relative 02/28/2021 3  % Final  . Eosinophils Absolute 02/28/2021 0.1  0.0 - 0.5 K/uL Final  . Basophils Relative 02/28/2021 0  % Final  . Basophils Absolute 02/28/2021 0.0  0.0 - 0.1 K/uL Final  . Immature Granulocytes 02/28/2021 0  % Final  . Abs Immature Granulocytes 02/28/2021 0.01  0.00 - 0.07 K/uL Final   Performed at [facility], [place] [address], [place], [place] [address]  . Sodium 02/28/2021 139  135 - 145 mmol/L Final  . Potassium 02/28/2021 3.4* 3.5 - 5.1 mmol/L Final  . Chloride 02/28/2021 102  98 - 111 mmol/L Final  . CO2 02/28/2021 29  22 - 32 mmol/L Final  . Glucose, Bld 02/28/2021 75  70 - 99 mg/dL Final   Glucose reference range applies only to samples taken after fasting for at least 8 hours.  . BUN 02/28/2021 17  6 - 20 mg/dL Final  . Creatinine, Ser 02/28/2021 0.60  0.44 - 1.00 mg/dL Final  . Calcium 02/28/2021 9.3  8.9 - 10.3 mg/dL Final  . Total Protein 02/28/2021 7.3  6.5 - 8.1 g/dL Final  . Albumin 02/28/2021 3.8  3.5 - 5.0 g/dL Final  . AST 02/28/2021  20  15 - 41 U/L Final  . ALT 02/28/2021 18  0 - 44 U/L Final  . Alkaline Phosphatase 02/28/2021 43  38 - 126 U/L Final  . Total Bilirubin 02/28/2021 0.6  0.3 - 1.2 mg/dL Final  . GFR, Estimated 02/28/2021 >60  >60 mL/min Final   Comment: (NOTE) Calculated using the CKD-EPI Creatinine Equation (2021)   . Anion gap 02/28/2021 8  5 - 15  Final   Performed at [facility], [place] [address], [place], [place] [address]  . Hgb A1c MFr Bld 02/28/2021 5.1  4.8 - 5.6 % Final   Comment: (NOTE) Pre diabetes:          5.7%-6.4%  Diabetes:              >6.4%  Glycemic control for   <7.0% adults with diabetes   . Mean Plasma Glucose 02/28/2021 99.67  mg/dL Final   Performed at [place] [address], [place], [place] [address]  . TSH 02/28/2021 2.782  0.350 - 4.500 uIU/mL Final   Comment: Performed by a 3rd Generation assay with a functional sensitivity of <=0.01 uIU/mL. Performed at [facility], [place] [address] [place], [place] [address]   . POC Amphetamine UR 02/28/2021 None Detected  NONE DETECTED (Cut Off Level 1000 ng/mL) Final  . POC Secobarbital (BAR) 02/28/2021 None Detected  NONE DETECTED (Cut Off Level 300 ng/mL) Final  . POC Buprenorphine (BUP) 02/28/2021 None Detected  NONE DETECTED (Cut Off Level 10 ng/mL) Final  . POC Oxazepam (BZO) 02/28/2021 None Detected  NONE DETECTED (Cut Off Level 300 ng/mL) Final  . POC Cocaine UR 02/28/2021 None Detected  NONE DETECTED (Cut Off Level 300 ng/mL) Final  . POC Methamphetamine UR 02/28/2021 None Detected  NONE DETECTED (Cut Off Level 1000 ng/mL) Final  . POC Morphine 02/28/2021 None Detected  NONE DETECTED (Cut Off Level 300 ng/mL) Final  . POC Oxycodone UR 02/28/2021 None Detected  NONE DETECTED (Cut Off Level 100 ng/mL) Final  . POC Methadone UR 02/28/2021 None Detected  NONE DETECTED (Cut Off Level 300 ng/mL) Final  . POC Marijuana UR 02/28/2021 None Detected  NONE DETECTED (Cut Off Level 50 ng/mL) Final  .  SARSCOV2ONAVIRUS 2 AG 02/28/2021 NEGATIVE  NEGATIVE Final   Comment: (NOTE) SARS-CoV-2 antigen NOT DETECTED.   Negative results are presumptive.  Negative results do not preclude SARS-CoV-2 infection and should not be used as the sole basis for treatment or other patient management decisions, including infection  control decisions, particularly in the presence of clinical signs and  symptoms consistent with COVID-19, or in those who have been in contact with the virus.  Negative results must be combined with clinical observations, patient history, and epidemiological information. The expected result is Negative.  Fact Sheet for Patients: HandmadeRecipes.com.cy  Fact Sheet for Healthcare Providers: FuneralLife.at  This test is not yet approved or cleared by the [place] FDA and  has been authorized for detection and/or diagnosis of SARS-CoV-2 by FDA under an Emergency Use Authorization (EUA).  This EUA will remain in effect (meaning this test can be used) for the duration of  the COV                          ID-19 declaration under Section 564(b)(1) of the Act, 21 U.S.C. section 360bbb-3(b)(1), unless the authorization is terminated or revoked sooner.    . Preg Test, Ur 02/28/2021 NEGATIVE  NEGATIVE Final   Comment:        THE SENSITIVITY OF THIS METHODOLOGY IS >24 mIU/mL   . Cholesterol 02/28/2021 138  0 - 200 mg/dL Final  . Triglycerides 02/28/2021 69  <150 mg/dL Final  . HDL 02/28/2021 50  >40 mg/dL Final  . Total CHOL/HDL Ratio 02/28/2021 2.8  RATIO Final  . VLDL 02/28/2021 14  0 - 40 mg/dL Final  . LDL  Cholesterol 02/28/2021 74  0 - 99 mg/dL Final   Comment:        Total Cholesterol/HDL:CHD Risk Coronary Heart Disease Risk Table                     Men   Women  1/2 Average Risk   3.4   3.3  Average Risk       5.0   4.4  2 X Average Risk   9.6   7.1  3 X Average Risk  23.4   11.0        Use the calculated Patient  Ratio above and the CHD Risk Table to determine the patient's CHD Risk.        ATP III CLASSIFICATION (LDL):  <100     mg/dL   Optimal  100-129  mg/dL   Near or Above                    Optimal  130-159  mg/dL   Borderline  160-189  mg/dL   High  >190     mg/dL   Very High Performed at [place] [address] [place], [place] [address]     Allergies: Patient has no known allergies.  PTA Medications: (Not in a hospital admission)   Medical Decision Making  Patient admitted to Continuous Assessment Unit for safety and stabilization  Lab Orders     Resp Panel by RT-PCR (Flu A&B, Covid) Nasopharyngeal Swab     CBC with Differential/Platelet     Pregnancy, urine     POC SARS Coronavirus 2 Ag-ED - Nasal Swab (BD Veritor Kit)     POCT Urine Drug Screen - ([facility])   Medication Management . benztropine  1 mg Oral BID  . chlorproMAZINE  50 mg Oral BID AC & HS      Recommendations  Based on my evaluation the patient does not appear to have an emergency medical condition.  Devarius Nelles, NP 03/08/21  7:05 PM

## 2021-03-09 ENCOUNTER — Encounter (HOSPITAL_COMMUNITY): Payer: Self-pay | Admitting: Psychiatry

## 2021-03-09 ENCOUNTER — Inpatient Hospital Stay (HOSPITAL_COMMUNITY)
Admission: AD | Admit: 2021-03-09 | Discharge: 2021-03-21 | DRG: 885 | Disposition: A | Discharge status: Home / Self Care | Payer: Medicare Other | Source: Intra-hospital | Attending: Psychiatry | Admitting: Psychiatry

## 2021-03-09 ENCOUNTER — Other Ambulatory Visit: Payer: Self-pay

## 2021-03-09 DIAGNOSIS — Z046 Encounter for general psychiatric examination, requested by authority: Secondary | ICD-10-CM | POA: Diagnosis not present

## 2021-03-09 DIAGNOSIS — F17210 Nicotine dependence, cigarettes, uncomplicated: Secondary | ICD-10-CM | POA: Diagnosis present

## 2021-03-09 DIAGNOSIS — R45851 Suicidal ideations: Secondary | ICD-10-CM | POA: Diagnosis present

## 2021-03-09 DIAGNOSIS — F203 Undifferentiated schizophrenia: Secondary | ICD-10-CM | POA: Diagnosis not present

## 2021-03-09 DIAGNOSIS — F200 Paranoid schizophrenia: Secondary | ICD-10-CM

## 2021-03-09 DIAGNOSIS — K5900 Constipation, unspecified: Secondary | ICD-10-CM | POA: Diagnosis present

## 2021-03-09 DIAGNOSIS — F32A Depression, unspecified: Secondary | ICD-10-CM | POA: Diagnosis present

## 2021-03-09 DIAGNOSIS — Z20822 Contact with and (suspected) exposure to covid-19: Secondary | ICD-10-CM | POA: Diagnosis present

## 2021-03-09 DIAGNOSIS — G4700 Insomnia, unspecified: Secondary | ICD-10-CM | POA: Diagnosis present

## 2021-03-09 DIAGNOSIS — Z79899 Other long term (current) drug therapy: Secondary | ICD-10-CM

## 2021-03-09 DIAGNOSIS — R000 Tachycardia, unspecified: Secondary | ICD-10-CM | POA: Diagnosis present

## 2021-03-09 DIAGNOSIS — F209 Schizophrenia, unspecified: Secondary | ICD-10-CM | POA: Diagnosis present

## 2021-03-09 DIAGNOSIS — K219 Gastro-esophageal reflux disease without esophagitis: Secondary | ICD-10-CM | POA: Diagnosis present

## 2021-03-09 MED ORDER — LORAZEPAM 2 MG/ML IJ SOLN
INTRAMUSCULAR | Status: AC
  Administered 2021-03-09: 2 mg via INTRAMUSCULAR
  Filled 2021-03-09: qty 1

## 2021-03-09 MED ORDER — RISPERIDONE 2 MG PO TBDP: 2.0000 mg | ORAL_TABLET | Freq: Three times a day (TID) | ORAL | Status: DC | PRN

## 2021-03-09 MED ORDER — HALOPERIDOL LACTATE 5 MG/ML IJ SOLN
INTRAMUSCULAR | Status: AC
  Administered 2021-03-09: 5 mg via INTRAMUSCULAR
  Filled 2021-03-09: qty 1

## 2021-03-09 MED ORDER — ZIPRASIDONE MESYLATE 20 MG IM SOLR: 20.0000 mg | Freq: Four times a day (QID) | INTRAMUSCULAR | Status: DC | PRN

## 2021-03-09 MED ORDER — HALOPERIDOL LACTATE 5 MG/ML IJ SOLN: 5.0000 mg | Freq: Once | INTRAMUSCULAR | Status: AC

## 2021-03-09 MED ORDER — ALUM & MAG HYDROXIDE-SIMETH 200-200-20 MG/5ML PO SUSP
30.0000 mL | ORAL | Status: DC | PRN
  Administered 2021-03-11 – 2021-03-12 (×3): 30 mL via ORAL
  Filled 2021-03-09 (×3): qty 30

## 2021-03-09 MED ORDER — MAGNESIUM HYDROXIDE 400 MG/5ML PO SUSP: 30.0000 mL | Freq: Every day | ORAL | Status: DC | PRN

## 2021-03-09 MED ORDER — DIPHENHYDRAMINE HCL 50 MG/ML IJ SOLN: 50.0000 mg | Freq: Once | INTRAMUSCULAR | Status: AC

## 2021-03-09 MED ORDER — FAMOTIDINE 20 MG PO TABS
20.0000 mg | ORAL_TABLET | Freq: Two times a day (BID) | ORAL | Status: DC
  Administered 2021-03-09 – 2021-03-21 (×23): 20 mg via ORAL
  Filled 2021-03-09 (×28): qty 1

## 2021-03-09 MED ORDER — LORAZEPAM 1 MG PO TABS: 1.0000 mg | ORAL_TABLET | Freq: Four times a day (QID) | ORAL | Status: AC | PRN

## 2021-03-09 MED ORDER — DIPHENHYDRAMINE HCL 50 MG/ML IJ SOLN
INTRAMUSCULAR | Status: AC
  Administered 2021-03-09: 50 mg via INTRAMUSCULAR
  Filled 2021-03-09: qty 1

## 2021-03-09 MED ORDER — CHLORPROMAZINE HCL 100 MG PO TABS
100.0000 mg | ORAL_TABLET | Freq: Two times a day (BID) | ORAL | Status: DC
  Administered 2021-03-09 – 2021-03-10 (×2): 100 mg via ORAL
  Filled 2021-03-09: qty 4
  Filled 2021-03-09 (×6): qty 1

## 2021-03-09 MED ORDER — CHLORPROMAZINE HCL 50 MG PO TABS
50.0000 mg | ORAL_TABLET | Freq: Two times a day (BID) | ORAL | Status: DC
  Filled 2021-03-09 (×4): qty 1

## 2021-03-09 MED ORDER — HALOPERIDOL 5 MG PO TABS
5.0000 mg | ORAL_TABLET | Freq: Two times a day (BID) | ORAL | Status: DC
  Filled 2021-03-09: qty 1

## 2021-03-09 MED ORDER — HALOPERIDOL 5 MG PO TABS: 5.0000 mg | ORAL_TABLET | Freq: Two times a day (BID) | ORAL | Status: DC

## 2021-03-09 MED ORDER — ACETAMINOPHEN 325 MG PO TABS
650.0000 mg | ORAL_TABLET | Freq: Four times a day (QID) | ORAL | Status: DC | PRN
  Administered 2021-03-12: 650 mg via ORAL
  Filled 2021-03-09: qty 2

## 2021-03-09 MED ORDER — ZIPRASIDONE MESYLATE 20 MG IM SOLR: 20.0000 mg | Freq: Four times a day (QID) | INTRAMUSCULAR | Status: AC | PRN

## 2021-03-09 MED ORDER — LORAZEPAM 2 MG/ML IJ SOLN: 2.0000 mg | Freq: Once | INTRAMUSCULAR | Status: AC

## 2021-03-09 MED ORDER — LORAZEPAM 1 MG PO TABS: 1.0000 mg | ORAL_TABLET | Freq: Four times a day (QID) | ORAL | Status: DC | PRN

## 2021-03-09 MED ORDER — OLANZAPINE 10 MG PO TBDP
10.0000 mg | ORAL_TABLET | Freq: Three times a day (TID) | ORAL | Status: DC | PRN
  Administered 2021-03-13: 10 mg via ORAL
  Filled 2021-03-09: qty 1

## 2021-03-09 MED ORDER — HALOPERIDOL 5 MG PO TABS
10.0000 mg | ORAL_TABLET | Freq: Two times a day (BID) | ORAL | Status: DC
  Administered 2021-03-09 – 2021-03-10 (×2): 10 mg via ORAL
  Filled 2021-03-09 (×7): qty 2

## 2021-03-09 MED ORDER — BENZTROPINE MESYLATE 1 MG PO TABS
1.0000 mg | ORAL_TABLET | Freq: Two times a day (BID) | ORAL | Status: DC
  Administered 2021-03-09 – 2021-03-12 (×6): 1 mg via ORAL
  Filled 2021-03-09 (×11): qty 1

## 2021-03-09 MED ORDER — HALOPERIDOL 5 MG PO TABS
5.0000 mg | ORAL_TABLET | Freq: Two times a day (BID) | ORAL | Status: DC
  Filled 2021-03-09 (×4): qty 1

## 2021-03-09 NOTE — Progress Notes (Incomplete)
Report called to nurse [name] at [facility]. [employer] was called to transport. She was transported with the necessary paperwork and her personal belongings.
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
## 2021-03-09 NOTE — H&P (Signed)
Psychiatric Admission Assessment Adult  Patient Identification: [name] [name] MRN:  [id number] Date of Evaluation:  03/09/2021 Chief Complaint:  Schizophrenia ([place]) [F20.9] Principal Diagnosis: <principal problem not specified> Diagnosis:  Active Problems:   Schizophrenia ([place])  History of Present Illness: Patient is seen and examined.  Patient is a 45 year old female with a past psychiatric history significant for paranoid schizophrenia who originally presented to the behavioral health urgent care center on 02/28/2021 with chief complaint that she was "having problems with God".  She stated at that time she felt as though Jesus was going to kill her.  She was apparently hospitalized from August 2021 until April or May 2022 at a facility in [place].  She apparently has been in [place] for approximately 2 weeks.  Per her chart review it was shown that the patient was discharged on Cogentin, Thorazine, Invega, Risperdal, Zoloft and trazodone.  The patient was unable to recall any of these medicines.  On examination today she still paranoid and delusional and believes that Jesus is going to kill her.  She was apparently then transferred to the behavioral health urgent care center.  It appears that she was discharged from the [facility] on 03/02/2021.  She then presented to the [facility] emergency department on 03/07/2021.  She reported abdominal pain and nausea and vomiting.  She had the same complaints at that time that she did earlier at the behavioral health urgent care center.  She was transferred to our facility on 03/09/2021.  Associated Signs/Symptoms: Depression Symptoms:  depressed mood, anhedonia, insomnia, psychomotor agitation, fatigue, feelings of worthlessness/guilt, difficulty concentrating, hopelessness, suicidal thoughts without plan, loss of energy/fatigue, disturbed sleep, weight loss, Duration of Depression Symptoms: Greater than  two weeks  (Hypo) Manic Symptoms:  Delusions, Distractibility, Hallucinations, Impulsivity, Irritable Mood, Labiality of Mood, Anxiety Symptoms:  Excessive Worry, Psychotic Symptoms:  Delusions, Hallucinations: Auditory Ideas of Reference, Paranoia, PTSD Symptoms: Negative Total Time spent with patient: 30 minutes  Past Psychiatric History: The patient was apparently almost hospitalized for a year in [place].  She apparently was discharged fairly recently.  Her last psychiatric hospitalization at our facility was in 05/19/2020.  Not surprisingly the reason for her admission at that time was because [facility] was trying to kill her as well.  She has been admitted to [facility] facilities multiple times with diagnosis of paranoid schizophrenia.  She has been treated with multiple medications including the long-acting paliperidone injection, Risperdal, sertraline, trazodone, Haldol, Thorazine.  Is the patient at risk to self? Yes.    Has the patient been a risk to self in the past 6 months? Yes.    Has the patient been a risk to self within the distant past? Yes.    Is the patient a risk to others? No.  Has the patient been a risk to others in the past 6 months? No.  Has the patient been a risk to others within the distant past? No.   Prior Inpatient Therapy:   Prior Outpatient Therapy:    Alcohol Screening:   Substance Abuse History in the last 12 months:  Yes.   Consequences of Substance Abuse: Negative Previous Psychotropic Medications: Yes  Psychological Evaluations: Yes  Past Medical History:  Past Medical History:  Diagnosis Date  . Arthritis   . Depression   . Hallucinations   . Paranoid schizophrenia ([place])   . Schizoaffective disorder ([place])    History reviewed. No pertinent surgical history. Family History: History reviewed.  No pertinent family history. Family Psychiatric  History: Denied Tobacco Screening:   Social History:  Social History   Substance  and Sexual Activity  Alcohol Use Not Currently   Comment: She denies      Social History   Substance and Sexual Activity  Drug Use Not Currently   Comment: She denies     Additional Social History:                           Allergies:  No Known Allergies Lab Results:  Results for orders placed or performed during the hospital encounter of 03/08/21 (from the past 48 hour(s))  Resp Panel by RT-PCR (Flu A&B, Covid) Nasopharyngeal Swab     Status: None   Collection Time: 03/08/21  7:23 PM   Specimen: Nasopharyngeal Swab; Nasopharyngeal(NP) swabs in vial transport medium  Result Value Ref Range   SARS Coronavirus 2 by RT PCR NEGATIVE NEGATIVE    Comment: (NOTE) SARS-CoV-2 target nucleic acids are NOT DETECTED.  The SARS-CoV-2 RNA is generally detectable in upper respiratory specimens during the acute phase of infection. The lowest concentration of SARS-CoV-2 viral copies this assay can detect is 138 copies/mL. A negative result does not preclude SARS-Cov-2 infection and should not be used as the sole basis for treatment or other patient management decisions. A negative result may occur with  improper specimen collection/handling, submission of specimen other than nasopharyngeal swab, presence of viral mutation(s) within the areas targeted by this assay, and inadequate number of viral copies(<138 copies/mL). A negative result must be combined with clinical observations, patient history, and epidemiological information. The expected result is Negative.  Fact Sheet for Patients:  EntrepreneurPulse.com.au  Fact Sheet for Healthcare Providers:  IncredibleEmployment.be  This test is no t yet approved or cleared by the [place] FDA and  has been authorized for detection and/or diagnosis of SARS-CoV-2 by FDA under an Emergency Use Authorization (EUA). This EUA will remain  in effect (meaning this test can be used) for the duration of  the COVID-19 declaration under Section 564(b)(1) of the Act, 21 U.S.C.section 360bbb-3(b)(1), unless the authorization is terminated  or revoked sooner.       Influenza A by PCR NEGATIVE NEGATIVE   Influenza B by PCR NEGATIVE NEGATIVE    Comment: (NOTE) The Xpert Xpress SARS-CoV-2/FLU/RSV plus assay is intended as an aid in the diagnosis of influenza from Nasopharyngeal swab specimens and should not be used as a sole basis for treatment. Nasal washings and aspirates are unacceptable for Xpert Xpress SARS-CoV-2/FLU/RSV testing.  Fact Sheet for Patients: EntrepreneurPulse.com.au  Fact Sheet for Healthcare Providers: IncredibleEmployment.be  This test is not yet approved or cleared by the [place] FDA and has been authorized for detection and/or diagnosis of SARS-CoV-2 by FDA under an Emergency Use Authorization (EUA). This EUA will remain in effect (meaning this test can be used) for the duration of the COVID-19 declaration under Section 564(b)(1) of the Act, 21 U.S.C. section 360bbb-3(b)(1), unless the authorization is terminated or revoked.  Performed at [facility], [facility] [address], [place], [place] [address]   POC SARS Coronavirus 2 Ag-ED - Nasal Swab (BD Veritor Kit)     Status: None (Preliminary result)   Collection Time: 03/08/21  7:23 PM  Result Value Ref Range   SARS Coronavirus 2 Ag Negative Negative  POCT Urine Drug Screen - ([facility])     Status: Normal   Collection Time: 03/08/21  7:24 PM  Result Value Ref Range   POC Amphetamine UR None Detected NONE DETECTED (Cut Off Level 1000 ng/mL)   POC Secobarbital (BAR) None Detected NONE DETECTED (Cut Off Level 300 ng/mL)   POC Buprenorphine (BUP) None Detected NONE DETECTED (Cut Off Level 10 ng/mL)   POC Oxazepam (BZO) None Detected NONE DETECTED (Cut Off Level 300 ng/mL)   POC Cocaine UR None Detected NONE DETECTED (Cut Off Level 300 ng/mL)   POC Methamphetamine UR None  Detected NONE DETECTED (Cut Off Level 1000 ng/mL)   POC Morphine None Detected NONE DETECTED (Cut Off Level 300 ng/mL)   POC Oxycodone UR None Detected NONE DETECTED (Cut Off Level 100 ng/mL)   POC Methadone UR None Detected NONE DETECTED (Cut Off Level 300 ng/mL)   POC Marijuana UR None Detected NONE DETECTED (Cut Off Level 50 ng/mL)  Pregnancy, urine POC     Status: None   Collection Time: 03/08/21  7:26 PM  Result Value Ref Range   Preg Test, Ur NEGATIVE NEGATIVE    Comment:        THE SENSITIVITY OF THIS METHODOLOGY IS >24 mIU/mL   CBC with Differential/Platelet     Status: Abnormal   Collection Time: 03/08/21  7:33 PM  Result Value Ref Range   WBC 4.0 4.0 - 10.5 K/uL   RBC 3.94 3.87 - 5.11 MIL/uL   Hemoglobin 11.3 (L) 12.0 - 15.0 g/dL   HCT 35.1 (L) 36.0 - 46.0 %   MCV 89.1 80.0 - 100.0 fL   MCH 28.7 26.0 - 34.0 pg   MCHC 32.2 30.0 - 36.0 g/dL   RDW 14.0 11.5 - 15.5 %   Platelets 325 150 - 400 K/uL   nRBC 0.0 0.0 - 0.2 %   Neutrophils Relative % 40 %   Neutro Abs 1.6 (L) 1.7 - 7.7 K/uL   Lymphocytes Relative 48 %   Lymphs Abs 2.0 0.7 - 4.0 K/uL   Monocytes Relative 9 %   Monocytes Absolute 0.4 0.1 - 1.0 K/uL   Eosinophils Relative 2 %   Eosinophils Absolute 0.1 0.0 - 0.5 K/uL   Basophils Relative 1 %   Basophils Absolute 0.0 0.0 - 0.1 K/uL   Immature Granulocytes 0 %   Abs Immature Granulocytes 0.01 0.00 - 0.07 K/uL    Comment: Performed at [facility], [address], [place], [place] [address]  Pregnancy, urine POC     Status: None   Collection Time: 03/08/21 10:02 PM  Result Value Ref Range   Preg Test, Ur NEGATIVE NEGATIVE    Comment:        THE SENSITIVITY OF THIS METHODOLOGY IS >24 mIU/mL     Blood Alcohol level:  Lab Results  Component Value Date   ETH <10 05/19/2020   ETH <10 59/29/2446    Metabolic Disorder Labs:  Lab Results  Component Value Date   HGBA1C 5.1 02/28/2021   MPG 99.67 02/28/2021   MPG 96.8 05/19/2020   Lab Results   Component Value Date   PROLACTIN 28.9 (H) 06/26/2018   Lab Results  Component Value Date   CHOL 138 02/28/2021   TRIG 69 02/28/2021   HDL 50 02/28/2021   CHOLHDL 2.8 02/28/2021   VLDL 14 02/28/2021   LDLCALC 74 02/28/2021   LDLCALC 121 (H) 05/19/2020    Current Medications: Current Facility-Administered Medications  Medication Dose Route Frequency Provider Last Rate Last Admin  . acetaminophen (TYLENOL) tablet 650 mg  650 mg Oral Q6H PRN Annaelle Kasel,  Cordie Grice, MD      . alum & mag hydroxide-simeth (MAALOX/MYLANTA) 200-200-20 MG/5ML suspension 30 mL  30 mL Oral Q4H PRN [name], MD      . benztropine (COGENTIN) tablet 1 mg  1 mg Oral BID [name], MD      . chlorproMAZINE (THORAZINE) tablet 100 mg  100 mg Oral BID [name], MD      . famotidine (PEPCID) tablet 20 mg  20 mg Oral BID [name], MD      . haloperidol (HALDOL) tablet 10 mg  10 mg Oral BID [name], MD      . OLANZapine zydis [facility]) disintegrating tablet 10 mg  10 mg Oral Q8H PRN [name], MD       And  . LORazepam (ATIVAN) tablet 1 mg  1 mg Oral Q6H PRN [name], MD       And  . ziprasidone (GEODON) injection 20 mg  20 mg Intramuscular Q6H PRN [name], MD      . magnesium hydroxide (MILK OF MAGNESIA) suspension 30 mL  30 mL Oral Daily PRN [name], MD       PTA Medications: Medications Prior to Admission  Medication Sig Dispense Refill Last Dose  . benztropine (COGENTIN) 1 MG tablet Take 1 tablet (1 mg total) by mouth 2 (two) times daily. 60 tablet 0   . chlorproMAZINE (THORAZINE) 50 MG tablet Take 1 tablet by mouth in the morning and at bedtime.     . famotidine (PEPCID) 20 MG tablet Take 20 mg by mouth 2 (two) times daily as needed.     . haloperidol (HALDOL) 5 MG tablet Take 1 tablet (5 mg total) by mouth 2 (two) times daily.     . haloperidol decanoate (HALDOL DECANOATE) 100 MG/ML injection Inject 100 mg into the muscle  every 28 (twenty-eight) days.     . vitamin C (ASCORBIC ACID) 500 MG tablet Take 500 mg by mouth daily.     . Vitamin D, Cholecalciferol, 25 MCG (1000 UT) TABS Take 1 tablet by mouth daily.       Musculoskeletal: Strength & Muscle Tone: decreased Gait & Station: shuffle Patient leans: Backward            Psychiatric Specialty Exam:  Presentation  General Appearance: Disheveled  Eye Contact:Fair  Speech:Slow  Speech Volume:Decreased  Handedness:Right   Mood and Affect  Mood:Dysphoric  Affect:Flat   Thought Process  Thought Processes:Disorganized  Duration of Psychotic Symptoms: Greater than six months  Past Diagnosis of Schizophrenia or Psychoactive disorder: Yes  Descriptions of Associations:Loose  Orientation:Full (Time, Place and Person)  Thought Content:Delusions  Hallucinations:Hallucinations: Auditory  Ideas of Reference:Delusions; Paranoia  Suicidal Thoughts:Suicidal Thoughts: No  Homicidal Thoughts:Homicidal Thoughts: No   Sensorium  Memory:Immediate Poor; Recent Poor; Remote Poor  Judgment:Impaired  Insight:Lacking   Executive Functions  Concentration:Poor  Attention Span:Poor  Recall:Poor  Fund of Knowledge:Poor  Language:Poor   Psychomotor Activity  Psychomotor Activity:Psychomotor Activity: Decreased   Assets  Assets:Desire for Improvement; Resilience   Sleep  Sleep:Sleep: Poor    Physical Exam: Physical Exam Vitals and nursing note reviewed.  HENT:     Head: Normocephalic and atraumatic.  Pulmonary:     Effort: Pulmonary effort is normal.  Neurological:     General: No focal deficit present.     Mental Status: She is alert.    ROS There were no vitals taken for this visit.  There is no height or weight on file to calculate BMI.  Treatment Plan Summary: Daily contact with patient to assess and evaluate symptoms and progress in treatment, Medication management and Plan : Patient is seen and  examined.  Patient is a 45 year old female with the above-stated past psychiatric history who was admitted secondary to worsening psychosis.  I would suspect that most likely she also has been noncompliant with any of her oral medicines.  Her current orders include Cogentin, Thorazine, Pepcid, Haldol and the Risperdal agitation protocol.  I will have pharmacy try and track down her last Invega injection.  Review of her admission laboratories showed essentially normal electrolytes with a creatinine of 0.70.  Liver function enzymes were normal.  Lipid panel was normal.  She had a mild anemia with a hemoglobin of 11.3 and hematocrit of 35.1.  Platelets were normal at 325,000.  Differential was essentially normal.  Hemoglobin A1c is 5.1.  Pregnancy test was negative.  TSH was 2.782.  Respiratory panel was negative for influenza A, B and coronavirus.  Drug screen was negative.  EKG was not obtained.  Her vital signs are stable, she is afebrile.  Observation Level/Precautions:  15 minute checks  Laboratory:  Chemistry Profile  Psychotherapy:    Medications:    Consultations:    Discharge Concerns:    Estimated LOS:  Other:     Physician Treatment Plan for Primary Diagnosis: <principal problem not specified> Long Term Goal(s): Improvement in symptoms so as ready for discharge  Short Term Goals: Ability to identify changes in lifestyle to reduce recurrence of condition will improve, Ability to verbalize feelings will improve, Ability to disclose and discuss suicidal ideas, Ability to demonstrate self-control will improve, Ability to identify and develop effective coping behaviors will improve, Ability to maintain clinical measurements within normal limits will improve and Compliance with prescribed medications will improve  Physician Treatment Plan for Secondary Diagnosis: Active Problems:   Schizophrenia ([place])  Long Term Goal(s): Improvement in symptoms so as ready for discharge  Short Term Goals:  Ability to identify changes in lifestyle to reduce recurrence of condition will improve, Ability to verbalize feelings will improve, Ability to disclose and discuss suicidal ideas, Ability to demonstrate self-control will improve, Ability to identify and develop effective coping behaviors will improve, Ability to maintain clinical measurements within normal limits will improve and Compliance with prescribed medications will improve  I certify that inpatient services furnished can reasonably be expected to improve the patient's condition.    [name], MD 5/4/20223:23 PM

## 2021-03-09 NOTE — BHH Suicide Risk Assessment (Signed)
[facility] Admission Suicide Risk Assessment   Nursing information obtained from:  Patient Demographic factors:  Living alone,Unemployed,Low socioeconomic status Current Mental Status:  NA Loss Factors:  Decline in physical health Historical Factors:  Impulsivity Risk Reduction Factors:  Sense of responsibility to family,Religious beliefs about death  Total Time spent with patient: 30 minutes Principal Problem: <principal problem not specified> Diagnosis:  Active Problems:   Schizophrenia ([facility])  Subjective Data: Patient is seen and examined.  Patient is a 45 year old female with a past psychiatric history significant for paranoid schizophrenia who originally presented to the behavioral health urgent care center on 02/28/2021 with chief complaint that she was "having problems with God".  She stated at that time she felt as though Jesus was going to kill her.  She was apparently hospitalized from August 2021 until April or May 2022 at a facility in [place].  She apparently has been in [place] for approximately 2 weeks.  Per her chart review it was shown that the patient was discharged on Cogentin, Thorazine, Invega, Risperdal, Zoloft and trazodone.  The patient was unable to recall any of these medicines.  On examination today she still paranoid and delusional and believes that Jesus is going to kill her.  She was apparently then transferred to the behavioral health urgent care center.  It appears that she was discharged from the [facility] on 03/02/2021.  She then presented to the [facility] emergency department on 03/07/2021.  She reported abdominal pain and nausea and vomiting.  She had the same complaints at that time that she did earlier at the behavioral health urgent care center.  She was transferred to our facility on 03/09/2021.  Continued Clinical Symptoms:    The "Alcohol Use Disorders Identification Test", Guidelines for Use in Primary Care,  Second Edition.  World Science writer [facility]). Score between 0-7:  no or low risk or alcohol related problems. Score between 8-15:  moderate risk of alcohol related problems. Score between 16-19:  high risk of alcohol related problems. Score 20 or above:  warrants further diagnostic evaluation for alcohol dependence and treatment.   CLINICAL FACTORS:   Schizophrenia:   Depressive state Paranoid or undifferentiated type   Musculoskeletal: Strength & Muscle Tone: within normal limits Gait & Station: shuffle Patient leans: N/A  Psychiatric Specialty Exam:  Presentation  General Appearance: Disheveled  Eye Contact:Fair  Speech:Slow  Speech Volume:Decreased  Handedness:Right   Mood and Affect  Mood:Dysphoric  Affect:Flat   Thought Process  Thought Processes:Disorganized  Descriptions of Associations:Loose  Orientation:Full (Time, Place and Person)  Thought Content:Delusions  History of Schizophrenia/Schizoaffective disorder:Yes  Duration of Psychotic Symptoms:Greater than six months  Hallucinations:Hallucinations: Auditory  Ideas of Reference:Delusions; Paranoia  Suicidal Thoughts:Suicidal Thoughts: No  Homicidal Thoughts:Homicidal Thoughts: No   Sensorium  Memory:Immediate Poor; Recent Poor; Remote Poor  Judgment:Impaired  Insight:Lacking   Executive Functions  Concentration:Poor  Attention Span:Poor  Recall:Poor  Fund of Knowledge:Poor  Language:Poor   Psychomotor Activity  Psychomotor Activity:Psychomotor Activity: Decreased   Assets  Assets:Desire for Improvement; Resilience   Sleep  Sleep:Sleep: Poor    Physical Exam: Physical Exam Vitals and nursing note reviewed.  HENT:     Head: Normocephalic and atraumatic.  Pulmonary:     Effort: Pulmonary effort is normal.  Neurological:     General: No focal deficit present.     Mental Status: She is alert.    ROS There were no vitals taken for this visit. There is no  height or weight on file to calculate BMI.   COGNITIVE FEATURES THAT CONTRIBUTE TO RISK:  Thought constriction (tunnel vision)    SUICIDE RISK:   Mild:  Suicidal ideation of limited frequency, intensity, duration, and specificity.  There are no identifiable plans, no associated intent, mild dysphoria and related symptoms, good self-control (both objective and subjective assessment), few other risk factors, and identifiable protective factors, including available and accessible social support.  PLAN OF CARE: Patient is seen and examined.  Patient is a 45 year old female with the above-stated past psychiatric history who was admitted secondary to worsening psychosis.  I would suspect that most likely she also has been noncompliant with any of her oral medicines.  Her current orders include Cogentin, Thorazine, Pepcid, Haldol and the Risperdal agitation protocol.  I will have pharmacy try and track down her last Invega injection.  Review of her admission laboratories showed essentially normal electrolytes with a creatinine of 0.70.  Liver function enzymes were normal.  Lipid panel was normal.  She had a mild anemia with a hemoglobin of 11.3 and hematocrit of 35.1.  Platelets were normal at 325,000.  Differential was essentially normal.  Hemoglobin A1c is 5.1.  Pregnancy test was negative.  TSH was 2.782.  Respiratory panel was negative for influenza A, B and coronavirus.  Drug screen was negative.  EKG was not obtained.  Her vital signs are stable, she is afebrile.  I certify that inpatient services furnished can reasonably be expected to improve the patient's condition.   Antonieta Pert, MD 03/09/2021, 3:12 PM
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
## 2021-03-09 NOTE — Progress Notes (Signed)
Provider, Reola Calkins, requested an ACTT referral for patient.  Patient has been admitted to [facility] for inpatient care. This social worker contacted inpatient social worker, Ruthann Cancer, regarding ACTT follow up services for patient.  Inpatient CSW will follow up.    Jori Thrall, LCSW, LCAS Clincal Social Worker  [facility]

## 2021-03-09 NOTE — ED Notes (Signed)
Pt was given an injection of ativan, benadryl, and haldol

## 2021-03-09 NOTE — ED Notes (Signed)
PATIENT DISCHARGED

## 2021-03-09 NOTE — Progress Notes (Signed)
Patient was accepted to [facility].    Meets inpatient criteria per Reola Calkins, NP.  Marland Kitchen  Attending physician is Dr. Jola Babinski.    Notified Milas Hock, RN and Rex Kras, RN of acceptance.  Nurses call report to [phone].    Patient can arrive after 2:00PM on 03/09/2021.    Penni Homans, MSW, LCSW 03/09/2021 11:59 AM

## 2021-03-09 NOTE — Progress Notes (Signed)
Adult Psychoeducational Group Note  Date:  03/09/2021 Time:  9:07 PM  Group Topic/Focus:  Wrap-Up Group:   The focus of this group is to help patients review their daily goal of treatment and discuss progress on daily workbooks.  Participation Level:  Did Not Attend  Participation Quality:  Did Not Attend  Affect:  Did Not Attend  Cognitive:  Did Not Attend  Insight: None  Engagement in Group:  Did Not Attend  Modes of Intervention:  Did Not Attend  Additional Comments:  Pt did not attend evening wrap up group tonight.  Felipa Furnace 03/09/2021, 9:07 PM

## 2021-03-09 NOTE — Tx Team (Signed)
Initial Treatment Plan 03/09/2021 3:12 PM [name] [name] [employer]:[id number]    PATIENT STRESSORS: Health problems   PATIENT STRENGTHS: Motivation for treatment/growth Supportive family/friends   PATIENT IDENTIFIED PROBLEMS: paranoia  anxiety  Religiosity                  DISCHARGE CRITERIA:  Ability to meet basic life and health needs Improved stabilization in mood, thinking, and/or behavior Motivation to continue treatment in a less acute level of care Need for constant or close observation no longer present  PRELIMINARY DISCHARGE PLAN: Attend aftercare/continuing care group Outpatient therapy Return to previous living arrangement  PATIENT/FAMILY INVOLVEMENT: This treatment plan has been presented to and reviewed with the patient, [name] [name].  The patient and family have been given the opportunity to ask questions and make suggestions.  Raylene Miyamoto, RN 03/09/2021, 3:12 PM

## 2021-03-09 NOTE — Progress Notes (Signed)
Pt under review at [facility].  Penni Homans, MSW, LCSW 03/09/2021 10:18 AM

## 2021-03-10 MED ORDER — HALOPERIDOL 5 MG PO TABS: 10.0000 mg | ORAL_TABLET | Freq: Three times a day (TID) | ORAL | Status: DC | PRN

## 2021-03-10 MED ORDER — RISPERIDONE 1 MG PO TBDP
3.0000 mg | ORAL_TABLET | Freq: Two times a day (BID) | ORAL | Status: DC
  Administered 2021-03-10: 3 mg via ORAL
  Filled 2021-03-10 (×4): qty 3

## 2021-03-10 MED ORDER — CHLORPROMAZINE HCL 100 MG PO TABS
100.0000 mg | ORAL_TABLET | Freq: Three times a day (TID) | ORAL | Status: DC
  Administered 2021-03-10 – 2021-03-11 (×2): 100 mg via ORAL
  Filled 2021-03-10 (×5): qty 1

## 2021-03-10 MED ORDER — TRAZODONE HCL 50 MG PO TABS
50.0000 mg | ORAL_TABLET | Freq: Once | ORAL | Status: AC | PRN
  Administered 2021-03-10: 50 mg via ORAL
  Filled 2021-03-10: qty 1

## 2021-03-10 MED ORDER — ENSURE ENLIVE PO LIQD
237.0000 mL | Freq: Two times a day (BID) | ORAL | Status: DC
  Administered 2021-03-10 – 2021-03-21 (×19): 237 mL via ORAL
  Filled 2021-03-10 (×26): qty 237

## 2021-03-10 NOTE — [facility] Suicide Risk Assessment (Signed)
[facility] INPATIENT:  Family/Significant Other Suicide Prevention Education   Refusal of Consents:  Suicide Prevention Education:  Patient Refusal for Family/Significant Other Suicide Prevention Education: The patient [name] [name] has refused to provide written consent for family/significant other to be provided Family/Significant Other Suicide Prevention Education during admission and/or prior to discharge.  Physician notified.   SPE completed with patient, as patient refused to consent to family contact. SPI pamphlet provided to pt and pt was encouraged to share information with support network, ask questions, and talk about any concerns relating to SPE. Patient denies access to guns/firearms and verbalized understanding of information provided. Mobile Crisis information also provided to patient.    Ruthann Cancer MSW, LCSW Clincal Social Worker  [facility]

## 2021-03-10 NOTE — Progress Notes (Signed)
[facility] MD Progress Note  03/10/2021 11:54 AM [name] [name]  MRN:  [id number] Subjective: Patient is a 45 year old female with a past psychiatric history significant for paranoid schizophrenia who originally presented to the behavioral health urgent care center on 02/28/2021 with chief complaint that she was "having problems with God".  Objective: Patient is seen and examined.  Patient is a 45 year old female with the above-stated past psychiatric history who is seen in follow-up.  She remains delusional, disorganized, as well as labile.  Her speech is pressured and mumbled.  It is difficult to understand what she says.  She admitted to auditory and visual hallucinations.  She remains at times labile and will just begin yelling out of nowhere.  She did sleep well last night with combination of the Thorazine and the Haldol.  She is requesting discharge.  We discussed the fact that she had been hospitalized in [place] for over 9 months.  She stated that she had return to the [place] area approximately 2 weeks ago.  She stated that they provided her with an airplane ticket.  She told me yesterday she was living in a condo, but I am not sure at all about any of the history she gave me.  She remains tachycardic with a rate of 110-125.  Blood pressure is stable.  Pulse oximetry on room air was 99%.  She slept 8.5 hours last night.  Hemoglobin A1c is 5.1.  Urine pregnancy test was negative.  TSH was 2.782.  Drug screen was negative.  EKG showed a sinus tachycardia with a rate of 101.  Normal QTc interval.  Principal Problem: <principal problem not specified> Diagnosis: Active Problems:   Schizophrenia ([place])  Total Time spent with patient: 15 minutes  Past Psychiatric History: See admission H&P  Past Medical History:  Past Medical History:  Diagnosis Date  . Arthritis   . Depression   . Hallucinations   . Paranoid schizophrenia ([place])   . Schizoaffective disorder ([place])    History reviewed. No pertinent  surgical history. Family History: History reviewed. No pertinent family history. Family Psychiatric  History: See admission H&P Social History:  Social History   Substance and Sexual Activity  Alcohol Use Not Currently   Comment: She denies      Social History   Substance and Sexual Activity  Drug Use Not Currently   Comment: She denies     Social History   Socioeconomic History  . Marital status: Single    Spouse name: Not on file  . Number of children: Not on file  . Years of education: Not on file  . Highest education level: Not on file  Occupational History  . Occupation: Disability  Tobacco Use  . Smoking status: Current Every Day Smoker    Packs/day: 1.00    Types: Cigarettes  . Smokeless tobacco: Never Used  Vaping Use  . Vaping Use: Never used  Substance and Sexual Activity  . Alcohol use: Not Currently    Comment: She denies   . Drug use: Not Currently    Comment: She denies   . Sexual activity: Not Currently  Other Topics Concern  . Not on file  Social History Narrative   Pt stated that she lives in [place], and that she lives alone.  Pt receives outpatient psychiatric resources through [facility].   Social Determinants of Health   Financial Resource Strain: Not on file  Food Insecurity: Not on file  Transportation Needs: Not on file  Physical Activity: Not  on file  Stress: Not on file  Social Connections: Not on file   Additional Social History:                         Sleep: Good  Appetite:  Fair  Current Medications: Current Facility-Administered Medications  Medication Dose Route Frequency Provider Last Rate Last Admin  . acetaminophen (TYLENOL) tablet 650 mg  650 mg Oral Q6H PRN [name], MD      . alum & mag hydroxide-simeth (MAALOX/MYLANTA) 200-200-20 MG/5ML suspension 30 mL  30 mL Oral Q4H PRN [name], MD      . benztropine (COGENTIN) tablet 1 mg  1 mg Oral BID [name], MD   1 mg at  03/10/21 0277  . chlorproMAZINE (THORAZINE) tablet 100 mg  100 mg Oral BID [name], MD   100 mg at 03/10/21 4128  . famotidine (PEPCID) tablet 20 mg  20 mg Oral BID [name], MD   20 mg at 03/10/21 7867  . haloperidol (HALDOL) tablet 10 mg  10 mg Oral BID [name], MD   10 mg at 03/10/21 6720  . OLANZapine zydis (ZYPREXA) disintegrating tablet 10 mg  10 mg Oral Q8H PRN [name], MD       And  . LORazepam (ATIVAN) tablet 1 mg  1 mg Oral Q6H PRN [name], MD       And  . ziprasidone (GEODON) injection 20 mg  20 mg Intramuscular Q6H PRN [name], MD      . magnesium hydroxide (MILK OF MAGNESIA) suspension 30 mL  30 mL Oral Daily PRN [name], MD        Lab Results:  Results for orders placed or performed during the hospital encounter of 03/08/21 (from the past 48 hour(s))  Resp Panel by RT-PCR (Flu A&B, Covid) Nasopharyngeal Swab     Status: None   Collection Time: 03/08/21  7:23 PM   Specimen: Nasopharyngeal Swab; Nasopharyngeal(NP) swabs in vial transport medium  Result Value Ref Range   SARS Coronavirus 2 by RT PCR NEGATIVE NEGATIVE    Comment: (NOTE) SARS-CoV-2 target nucleic acids are NOT DETECTED.  The SARS-CoV-2 RNA is generally detectable in upper respiratory specimens during the acute phase of infection. The lowest concentration of SARS-CoV-2 viral copies this assay can detect is 138 copies/mL. A negative result does not preclude SARS-Cov-2 infection and should not be used as the sole basis for treatment or other patient management decisions. A negative result may occur with  improper specimen collection/handling, submission of specimen other than nasopharyngeal swab, presence of viral mutation(s) within the areas targeted by this assay, and inadequate number of viral copies(<138 copies/mL). A negative result must be combined with clinical observations, patient history, and epidemiological information. The  expected result is Negative.  Fact Sheet for Patients:  EntrepreneurPulse.com.au  Fact Sheet for Healthcare Providers:  IncredibleEmployment.be  This test is no t yet approved or cleared by the [place] FDA and  has been authorized for detection and/or diagnosis of SARS-CoV-2 by FDA under an Emergency Use Authorization (EUA). This EUA will remain  in effect (meaning this test can be used) for the duration of the COVID-19 declaration under Section 564(b)(1) of the Act, 21 U.S.C.section 360bbb-3(b)(1), unless the authorization is terminated  or revoked sooner.       Influenza A by PCR NEGATIVE NEGATIVE   Influenza B by PCR NEGATIVE  NEGATIVE    Comment: (NOTE) The Xpert Xpress SARS-CoV-2/FLU/RSV plus assay is intended as an aid in the diagnosis of influenza from Nasopharyngeal swab specimens and should not be used as a sole basis for treatment. Nasal washings and aspirates are unacceptable for Xpert Xpress SARS-CoV-2/FLU/RSV testing.  Fact Sheet for Patients: EntrepreneurPulse.com.au  Fact Sheet for Healthcare Providers: IncredibleEmployment.be  This test is not yet approved or cleared by the [place] FDA and has been authorized for detection and/or diagnosis of SARS-CoV-2 by FDA under an Emergency Use Authorization (EUA). This EUA will remain in effect (meaning this test can be used) for the duration of the COVID-19 declaration under Section 564(b)(1) of the Act, 21 U.S.C. section 360bbb-3(b)(1), unless the authorization is terminated or revoked.  Performed at [facility], [place] [address], [place], [place] [address]   POC SARS Coronavirus 2 Ag-ED - Nasal Swab (BD Veritor Kit)     Status: None (Preliminary result)   Collection Time: 03/08/21  7:23 PM  Result Value Ref Range   SARS Coronavirus 2 Ag Negative Negative  POCT Urine Drug Screen - (ICup)     Status: Normal   Collection Time:  03/08/21  7:24 PM  Result Value Ref Range   POC Amphetamine UR None Detected NONE DETECTED (Cut Off Level 1000 ng/mL)   POC Secobarbital (BAR) None Detected NONE DETECTED (Cut Off Level 300 ng/mL)   POC Buprenorphine (BUP) None Detected NONE DETECTED (Cut Off Level 10 ng/mL)   POC Oxazepam (BZO) None Detected NONE DETECTED (Cut Off Level 300 ng/mL)   POC Cocaine UR None Detected NONE DETECTED (Cut Off Level 300 ng/mL)   POC Methamphetamine UR None Detected NONE DETECTED (Cut Off Level 1000 ng/mL)   POC Morphine None Detected NONE DETECTED (Cut Off Level 300 ng/mL)   POC Oxycodone UR None Detected NONE DETECTED (Cut Off Level 100 ng/mL)   POC Methadone UR None Detected NONE DETECTED (Cut Off Level 300 ng/mL)   POC Marijuana UR None Detected NONE DETECTED (Cut Off Level 50 ng/mL)  Pregnancy, urine POC     Status: None   Collection Time: 03/08/21  7:26 PM  Result Value Ref Range   Preg Test, Ur NEGATIVE NEGATIVE    Comment:        THE SENSITIVITY OF THIS METHODOLOGY IS >24 mIU/mL   CBC with Differential/Platelet     Status: Abnormal   Collection Time: 03/08/21  7:33 PM  Result Value Ref Range   WBC 4.0 4.0 - 10.5 K/uL   RBC 3.94 3.87 - 5.11 MIL/uL   Hemoglobin 11.3 (L) 12.0 - 15.0 g/dL   HCT 35.1 (L) 36.0 - 46.0 %   MCV 89.1 80.0 - 100.0 fL   MCH 28.7 26.0 - 34.0 pg   MCHC 32.2 30.0 - 36.0 g/dL   RDW 14.0 11.5 - 15.5 %   Platelets 325 150 - 400 K/uL   nRBC 0.0 0.0 - 0.2 %   Neutrophils Relative % 40 %   Neutro Abs 1.6 (L) 1.7 - 7.7 K/uL   Lymphocytes Relative 48 %   Lymphs Abs 2.0 0.7 - 4.0 K/uL   Monocytes Relative 9 %   Monocytes Absolute 0.4 0.1 - 1.0 K/uL   Eosinophils Relative 2 %   Eosinophils Absolute 0.1 0.0 - 0.5 K/uL   Basophils Relative 1 %   Basophils Absolute 0.0 0.0 - 0.1 K/uL   Immature Granulocytes 0 %   Abs Immature Granulocytes 0.01 0.00 - 0.07 K/uL  Comment: Performed at [facility], [place] [address], [place], [place] [address]  Pregnancy, urine  POC     Status: None   Collection Time: 03/08/21 10:02 PM  Result Value Ref Range   Preg Test, Ur NEGATIVE NEGATIVE    Comment:        THE SENSITIVITY OF THIS METHODOLOGY IS >24 mIU/mL     Blood Alcohol level:  Lab Results  Component Value Date   ETH <10 05/19/2020   ETH <10 05/17/1974    Metabolic Disorder Labs: Lab Results  Component Value Date   HGBA1C 5.1 02/28/2021   MPG 99.67 02/28/2021   MPG 96.8 05/19/2020   Lab Results  Component Value Date   PROLACTIN 28.9 (H) 06/26/2018   Lab Results  Component Value Date   CHOL 138 02/28/2021   TRIG 69 02/28/2021   HDL 50 02/28/2021   CHOLHDL 2.8 02/28/2021   VLDL 14 02/28/2021   LDLCALC 74 02/28/2021   LDLCALC 121 (H) 05/19/2020    Physical Findings: AIMS: Facial and Oral Movements Muscles of Facial Expression: None, normal Lips and Perioral Area: None, normal Jaw: None, normal Tongue: None, normal,Extremity Movements Upper (arms, wrists, hands, fingers): None, normal Lower (legs, knees, ankles, toes): None, normal, Trunk Movements Neck, shoulders, hips: None, normal, Overall Severity Severity of abnormal movements (highest score from questions above): None, normal Incapacitation due to abnormal movements: None, normal Patient's awareness of abnormal movements (rate only patient's report): No Awareness, Dental Status Current problems with teeth and/or dentures?: No Does patient usually wear dentures?: No  CIWA:    COWS:     Musculoskeletal: Strength & Muscle Tone: within normal limits Gait & Station: shuffle Patient leans: N/A  Psychiatric Specialty Exam:  Presentation  General Appearance: Disheveled  Eye Contact:Fair  Speech:Pressured  Speech Volume:Decreased  Handedness:Right   Mood and Affect  Mood:Dysphoric  Affect:Labile   Thought Process  Thought Processes:Disorganized  Descriptions of Associations:Loose  Orientation:Partial  Thought Content:Delusions; Illogical; Paranoid  Ideation; Scattered  History of Schizophrenia/Schizoaffective disorder:Yes  Duration of Psychotic Symptoms:Greater than six months  Hallucinations:Hallucinations: Auditory  Ideas of Reference:Delusions; Paranoia  Suicidal Thoughts:Suicidal Thoughts: No  Homicidal Thoughts:Homicidal Thoughts: No   Sensorium  Memory:Immediate Poor; Recent Poor; Remote Poor  Judgment:Impaired  Insight:Lacking   Executive Functions  Concentration:Poor  Attention Span:Poor  Recall:Poor  Fund of Knowledge:Poor  Language:Poor   Psychomotor Activity  Psychomotor Activity:Psychomotor Activity: Increased   Assets  Assets:Desire for Improvement; Resilience   Sleep  Sleep:Sleep: Good Number of Hours of Sleep: 8.5    Physical Exam: Physical Exam Vitals and nursing note reviewed.  HENT:     Head: Normocephalic and atraumatic.  Pulmonary:     Effort: Pulmonary effort is normal.  Neurological:     General: No focal deficit present.     Mental Status: She is alert.    ROS Blood pressure 113/82, pulse (!) 125, temperature 98.4 F (36.9 C), temperature source Oral, resp. rate 18, height _0  (1.473 m), weight 42.2 kg, SpO2 99 %. Body mass index is 19.44 kg/m.   Treatment Plan Summary: Daily contact with patient to assess and evaluate symptoms and progress in treatment, Medication management and Plan : Patient is seen and examined.  Patient is a 45 year old female with the above-stated past psychiatric history who is seen in follow-up.   Diagnosis: 1.  Schizophrenia; most probably undifferentiated. 2.  Tachycardia  Pertinent findings on examination today: 1.  Patient remains significantly disorganized. 2.  Patient remains psychotic. 3.  Sleep is good.  Plan: 1.  Continue Cogentin 1 mg p.o. twice daily for side effects of medications. 2.  Continue Thorazine 100 mg p.o. twice daily for psychosis. 3.  Continue Pepcid 20 mg p.o. twice daily for gastric protection. 4.   Continue haloperidol 10 mg p.o. twice daily for psychosis. 5.  Continue Zyprexa Zydis agitation protocol as needed. 6.  We need to review and get the old records from [employer] with regard to her medications.  Review of the record so far reveals that she has been on long-acting paliperidone, and it does appear that she received the long-acting Haldol Decanoate injection.  I need to get a better handle on all the antipsychotic medications that are in her system currently. 7.  Disposition planning-in progress, I personally believe she needs to go to the state hospital given her history.  [name], MD 03/10/2021, 11:54 AM

## 2021-03-10 NOTE — Progress Notes (Signed)
Recreation Therapy Notes  Date: 5.522 Time: 1000 Location: 500 Hall  Group Topic: Anxiety  Goal Area(s) Addresses:  Patient will effectively will identify triggers to anxiety. Patient will identify coping skills for dealing with anxiousness.   Behavioral Response: Engaged  Intervention: Worksheet  Activity: Introduction to Anxiety.  Patients were to identify the things that cause them to be anxious.  Patients were to also identify the thoughts and feelings they experience when anxious.  Lastly, patients were to identify positive coping skills for managing anxiety.  Education: Anxiety, Discharge Planning  Education Outcome: Acknowledges understanding/In group clarification offered/Needs additional education.   Clinical Observations/Feedback: Due to maintenance switching out furniture during group time, unable to conduct group as usual.  LRT did 1 to 1 interactions with patients.  Pt was attentive and was appropriate.  Pt expressed triggers to anxiety were having things to do, arguments and moving.  Pt also expressed the physical symptoms she has are crying and to start cleaning.  Pt expressed getting suicidal and throw things when anxious.  Pt explained coping skills for anxiety as listening to music, catching a bus ride or to do something "worthwile" like get a job.     [name], LRT/CTRS    [name] A 03/10/2021 11:25 AM

## 2021-03-10 NOTE — Progress Notes (Signed)
Recreation Therapy Notes  INPATIENT RECREATION THERAPY ASSESSMENT  Patient Details Name: Dabria Wadas MRN: [id number] DOB: 19-Nov-1975 Today's Date: 03/10/2021       Information Obtained From: Patient  Able to Participate in Assessment/Interview: Yes  Patient Presentation: Alert  Reason for Admission (Per Patient): Other (Comments) (Paranoid)  Patient Stressors: Other (Comment) (Moving)  Coping Skills:   Research officer, political party  Leisure Interests (2+):  Exercise - Walking,Community - Other (Comment),Individual - Other (Comment) (Cleaning; Shopping)  Frequency of Recreation/Participation: Other (Comment) ("all the time")  Awareness of Community Resources:  Yes  Community Resources:  Sports administrator (Comment) (Bus stop)  Current Use: Yes  If no, Barriers?:    Expressed Interest in [employer] Information: No  County of Residence:  [place]  Patient Main Form of Transportation: Therapist, music  Patient Strengths:  Dispensing optician; Being Strong  Patient Identified Areas of Improvement:  Less paranoid  Patient Goal for Hospitalization:  "to take my meds, get over paranoia and go home"  Current SI (including self-harm):  No  Current HI:  No  Current AVH: No  Staff Intervention Plan: Group Attendance,Collaborate with Interdisciplinary Treatment Team  Consent to Intern Participation: N/A    [name], LRT/CTRS  [name], Takisha Pelle A 03/10/2021, 11:09 AM
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
## 2021-03-10 NOTE — Progress Notes (Signed)
DAR NOTE: Patient presents with anxious affect and depressed mood.  Denies suicidal thoughts but is observed responding to internal stimuli, mumbling and yelling in her room. Rates depression at 5, hopelessness at 3, and anxiety at 5.  Maintained on routine safety checks.  Medications given as prescribed.  Support and encouragement offered as needed.  Attended group and participated.  States goal for today is "follow rules and work towards discharge."  Patient visible in milieu watching TV and talking on the phone for hours at a time.  Patient is safe on and off the unit.

## 2021-03-10 NOTE — Progress Notes (Signed)
NUTRITION ASSESSMENT  Pt identified as at risk on the Malnutrition Screen Tool  INTERVENTION: 1. Supplement: Ensure Enlive po BID, each supplement provides 350 kcal and 20 grams of protein   NUTRITION DIAGNOSIS: Unintentional weight loss related to sub-optimal intake as evidenced by pt report.   Goal: Pt to meet >/= 90% of their estimated nutrition needs.  Monitor:  PO intake  Assessment:  Pt admitted for schizophrenia. Pt with poor appetite.  Per weight records, pt has lost 27 lbs since July 2021 ( 22% wt loss x 9.5 months, significant for time frame). Will order Ensure supplements.   Height: Ht Readings from Last 1 Encounters:  03/09/21 4\' 10"  (1.473 m)    Weight: Wt Readings from Last 1 Encounters:  03/09/21 42.2 kg    Weight Hx: Wt Readings from Last 10 Encounters:  03/09/21 42.2 kg  05/29/20 54.9 kg  05/19/20 53.5 kg  05/19/20 54.9 kg  05/02/20 49.4 kg  04/23/20 49.4 kg  04/23/20 49.4 kg  02/14/20 48.5 kg  01/05/20 48.5 kg  12/30/19 49 kg    BMI:  Body mass index is 19.44 kg/m. Pt meets criteria for normal based on current BMI.  Estimated Nutritional Needs: Kcal: 25-30 kcal/kg Protein: > 1 gram protein/kg Fluid: 1 ml/kcal  Diet Order:  Diet Order            Diet regular Room service appropriate? Yes; Fluid consistency: Thin  Diet effective now                Pt is also offered choice of unit snacks mid-morning and mid-afternoon.  Pt is eating as desired.   Lab results and medications reviewed.   01/01/20, MS, RD, LDN Inpatient Clinical Dietitian Contact information available via Amion

## 2021-03-10 NOTE — BHH Group Notes (Signed)
Occupational Therapy Group Note Date: 03/10/2021 Group Topic/Focus: Health and Wellness  Group Description: Group encouraged increased engagement and participation through discussion focused on overall health and wellness. Patients engaged in discussion and shared their favorite ways to be physically active and exercise, identifying benefits both emotionally and physically. Following discussion, patients were encouraged to engage in an active movement practiced focused on gentle stretching and seated yoga poses.  Participation Level: Minimal   Participation Quality: Pt did not participate   Behavior: Bizarre   Speech/Thought Process: Distracted, Pressured and Slurred   Affect/Mood: Constricted   Insight: Impaired   Judgement: Impaired   Individualization: Elba was observed seated in the corner of group space by the phone, mumbling to self. Pt began mumbling louder and louder as group discussion began, disrupting peers. Pt politely asked by this writer to be quiet, and pt responded abruptly by standing up from her chair and leaving group room. Pt returned at close of group asking to use the phone. Present ~ 10 minutes total.   Modes of Intervention: Activity, Discussion and Education  Patient Response to Interventions:  Disengaged   Plan: Continue to engage patient in OT groups 2 - 3x/week.  03/10/2021  Donne Hazel, MOT, OTR/L

## 2021-03-10 NOTE — BHH Group Notes (Signed)
BHH Group Notes:  (Nursing/MHT/Case Management/Adjunct)  Date:  03/10/2021  Time:  10:53 AM  Type of Therapy:  Group Therapy  Participation Level:  Minimal  Participation Quality:  Inattentive  Affect:  Labile  Cognitive:  Delusional  Insight:  Lacking  Engagement in Group:  Lacking  Modes of Intervention:  Orientation  Summary of Progress/Problems: Her goal today is to follow rules and work towards discharge.   Jayelyn Barno J Virna Livengood 03/10/2021, 10:53 AM

## 2021-03-10 NOTE — Progress Notes (Addendum)
   03/10/21 0000  Psych Admission Type (Psych Patients Only)  Admission Status Involuntary  Psychosocial Assessment  Patient Complaints None  Eye Contact Brief  Facial Expression Flat (just woke up)  Affect Appropriate to circumstance  Speech Soft;Slow  Interaction Cautious;Forwards little;Minimal  Motor Activity Slow  Appearance/Hygiene Unremarkable  Behavior Characteristics Cooperative;Anxious  Mood Anxious  Thought Process  Coherency Concrete thinking  Content WDL  Delusions None reported or observed  Perception Hallucinations  Hallucination None reported or observed  Judgment Limited  Confusion UTA  Danger to Self  Current suicidal ideation? Denies  Danger to Others  Danger to Others None reported or observed   Pt isolative and sleeping. Awoke around midnight for a snack. Denies SI, HI, AVH and pain.

## 2021-03-11 MED ORDER — TRAZODONE HCL 150 MG PO TABS
150.0000 mg | ORAL_TABLET | Freq: Every day | ORAL | Status: DC
  Administered 2021-03-11 – 2021-03-20 (×10): 150 mg via ORAL
  Filled 2021-03-11 (×13): qty 1

## 2021-03-11 MED ORDER — RISPERIDONE 2 MG PO TBDP
4.0000 mg | ORAL_TABLET | Freq: Two times a day (BID) | ORAL | Status: DC
  Administered 2021-03-11 – 2021-03-12 (×3): 4 mg via ORAL
  Filled 2021-03-11 (×8): qty 2

## 2021-03-11 MED ORDER — HALOPERIDOL 5 MG PO TABS
10.0000 mg | ORAL_TABLET | Freq: Three times a day (TID) | ORAL | Status: DC
  Administered 2021-03-11 – 2021-03-12 (×4): 10 mg via ORAL
  Filled 2021-03-11 (×9): qty 2

## 2021-03-11 NOTE — Progress Notes (Signed)
   03/10/21 1000  Psych Admission Type (Psych Patients Only)  Admission Status Involuntary  Psychosocial Assessment  Patient Complaints Suspiciousness;Restlessness  Eye Contact Brief  Facial Expression Flat (just woke up)  Affect Appropriate to circumstance  Speech Soft;Slow  Interaction Cautious;Forwards little;Minimal  Motor Activity Slow  Appearance/Hygiene Unremarkable  Behavior Characteristics Cooperative  Mood Preoccupied  Thought Process  Coherency Concrete thinking  Content WDL  Delusions None reported or observed  Perception Hallucinations  Hallucination None reported or observed  Judgment Limited  Confusion UTA  Danger to Self  Current suicidal ideation? Denies  Danger to Others  Danger to Others None reported or observed   Pt been visible on the unit some this evening. Pt received 1x 50 mg Trazodone per NP-Patrice due to pt requesting something for sleep and her not having anything ordered

## 2021-03-11 NOTE — Progress Notes (Signed)
[facility] MD Progress Note  03/11/2021 12:45 PM [name] [name]  MRN:  [id number] Subjective:  Patient is a 45 year old female with a past psychiatric history significant for paranoid schizophrenia who originally presented to the behavioral health urgent care center on 02/28/2021 with chief complaint that she was "having problems with God".  Objective: Patient is seen and examined.  Patient is a 45 year old female with the above-stated past psychiatric history is seen in follow-up.  She remains delusional, disorganized, labile.  I attempted increase her Thorazine yesterday as well as putting her on Risperdal.  She still looks not good.  She did not sleep well as well.  Review of the electronic medical record revealed that she had received Haldol Decanoate 100 mg IM while she was at the behavioral health urgent care center prior to coming to our facility.  I have decided to stop the Thorazine and go back to Haldol given she had received this.  Her pulse is 119, but the rest of her vital signs are stable.  She only slept approximately 4-1/2 hours last night.  No new laboratories.  Principal Problem: <principal problem not specified> Diagnosis: Active Problems:   Schizophrenia (HCC)  Total Time spent with patient: 15 minutes  Past Psychiatric History: See admission H&P  Past Medical History:  Past Medical History:  Diagnosis Date  . Arthritis   . Depression   . Hallucinations   . Paranoid schizophrenia (HCC)   . Schizoaffective disorder (HCC)    History reviewed. No pertinent surgical history. Family History: History reviewed. No pertinent family history. Family Psychiatric  History: See admission H&P Social History:  Social History   Substance and Sexual Activity  Alcohol Use Not Currently   Comment: She denies      Social History   Substance and Sexual Activity  Drug Use Not Currently   Comment: She denies     Social History   Socioeconomic History  . Marital status: Single    Spouse  name: Not on file  . Number of children: Not on file  . Years of education: Not on file  . Highest education level: Not on file  Occupational History  . Occupation: Disability  Tobacco Use  . Smoking status: Current Every Day Smoker    Packs/day: 1.00    Types: Cigarettes  . Smokeless tobacco: Never Used  Vaping Use  . Vaping Use: Never used  Substance and Sexual Activity  . Alcohol use: Not Currently    Comment: She denies   . Drug use: Not Currently    Comment: She denies   . Sexual activity: Not Currently  Other Topics Concern  . Not on file  Social History Narrative   Pt stated that she lives in [place], and that she lives alone.  Pt receives outpatient psychiatric resources through [facility].   Social Determinants of Health   Financial Resource Strain: Not on file  Food Insecurity: Not on file  Transportation Needs: Not on file  Physical Activity: Not on file  Stress: Not on file  Social Connections: Not on file   Additional Social History:                         Sleep: Poor  Appetite:  Fair  Current Medications: Current Facility-Administered Medications  Medication Dose Route Frequency Provider Last Rate Last Admin  . acetaminophen (TYLENOL) tablet 650 mg  650 mg Oral Q6H PRN Antonieta Pert, MD      . alum &  mag hydroxide-simeth (MAALOX/MYLANTA) 200-200-20 MG/5ML suspension 30 mL  30 mL Oral Q4H PRN Antonieta Pert, MD   30 mL at 03/11/21 0854  . benztropine (COGENTIN) tablet 1 mg  1 mg Oral BID Antonieta Pert, MD   1 mg at 03/11/21 0810  . famotidine (PEPCID) tablet 20 mg  20 mg Oral BID Antonieta Pert, MD   20 mg at 03/11/21 0810  . feeding supplement (ENSURE ENLIVE / ENSURE PLUS) liquid 237 mL  237 mL Oral BID BM Antonieta Pert, MD   237 mL at 03/11/21 1033  . haloperidol (HALDOL) tablet 10 mg  10 mg Oral TID Antonieta Pert, MD      . OLANZapine zydis [facility]) disintegrating tablet 10 mg  10 mg Oral Q8H PRN Antonieta Pert, MD       And  . LORazepam (ATIVAN) tablet 1 mg  1 mg Oral Q6H PRN Antonieta Pert, MD       And  . ziprasidone (GEODON) injection 20 mg  20 mg Intramuscular Q6H PRN Antonieta Pert, MD      . magnesium hydroxide (MILK OF MAGNESIA) suspension 30 mL  30 mL Oral Daily PRN Antonieta Pert, MD      . risperiDONE (RISPERDAL M-TABS) disintegrating tablet 4 mg  4 mg Oral BID Antonieta Pert, MD   4 mg at 03/11/21 9774  . traZODone (DESYREL) tablet 150 mg  150 mg Oral QHS Antonieta Pert, MD        Lab Results: No results found for this or any previous visit (from the past 48 hour(s)).  Blood Alcohol level:  Lab Results  Component Value Date   ETH <10 05/19/2020   ETH <10 05/11/2020    Metabolic Disorder Labs: Lab Results  Component Value Date   HGBA1C 5.1 02/28/2021   MPG 99.67 02/28/2021   MPG 96.8 05/19/2020   Lab Results  Component Value Date   PROLACTIN 28.9 (H) 06/26/2018   Lab Results  Component Value Date   CHOL 138 02/28/2021   TRIG 69 02/28/2021   HDL 50 02/28/2021   CHOLHDL 2.8 02/28/2021   VLDL 14 02/28/2021   LDLCALC 74 02/28/2021   LDLCALC 121 (H) 05/19/2020    Physical Findings: AIMS: Facial and Oral Movements Muscles of Facial Expression: None, normal Lips and Perioral Area: None, normal Jaw: None, normal Tongue: None, normal,Extremity Movements Upper (arms, wrists, hands, fingers): None, normal Lower (legs, knees, ankles, toes): None, normal, Trunk Movements Neck, shoulders, hips: None, normal, Overall Severity Severity of abnormal movements (highest score from questions above): None, normal Incapacitation due to abnormal movements: None, normal Patient's awareness of abnormal movements (rate only patient's report): No Awareness, Dental Status Current problems with teeth and/or dentures?: No Does patient usually wear dentures?: No  CIWA:    COWS:     Musculoskeletal: Strength & Muscle Tone: within normal limits Gait &  Station: normal Patient leans: N/A  Psychiatric Specialty Exam:  Presentation  General Appearance: Disheveled  Eye Contact:Fair  Speech:Garbled  Speech Volume:Decreased  Handedness:Right   Mood and Affect  Mood:Dysphoric  Affect:Flat   Thought Process  Thought Processes:Disorganized  Descriptions of Associations:Loose  Orientation:Partial  Thought Content:Delusions; Paranoid Ideation  History of Schizophrenia/Schizoaffective disorder:Yes  Duration of Psychotic Symptoms:Greater than six months  Hallucinations:Hallucinations: Auditory  Ideas of Reference:Delusions; Paranoia  Suicidal Thoughts:Suicidal Thoughts: No  Homicidal Thoughts:Homicidal Thoughts: No   Sensorium  Memory:Immediate Poor; Recent Poor; Remote Poor  Judgment:Impaired  Insight:Lacking  Executive Functions  Concentration:Poor  Attention Span:Poor  Recall:Poor  Progress Energy of Knowledge:Poor  Language:Poor   Psychomotor Activity  Psychomotor Activity:Psychomotor Activity: Increased   Assets  Assets:Desire for Improvement; Resilience   Sleep  Sleep:Sleep: Poor Number of Hours of Sleep: 4.5    Physical Exam: Physical Exam Vitals and nursing note reviewed.  HENT:     Head: Normocephalic and atraumatic.  Pulmonary:     Effort: Pulmonary effort is normal.  Neurological:     General: No focal deficit present.     Mental Status: She is alert.    ROS Blood pressure 124/74, pulse (!) 119, temperature 98.2 F (36.8 C), temperature source Oral, resp. rate 16, height 4\' 10"  (1.473 m), weight 42.2 kg, SpO2 100 %. Body mass index is 19.44 kg/m.   Treatment Plan Summary: Daily contact with patient to assess and evaluate symptoms and progress in treatment, Medication management and Plan : Patient is seen and examined.  Patient is a 45 year old female with the above-stated past psychiatric history who is seen in follow-up.   Diagnosis: 1.  Schizophrenia; most probably  undifferentiated. 2.  Tachycardia  Pertinent findings on examination today: 1.  Patient remains significantly psychotic and disorganized. 2.  Sleep last night was not as good as it was 2 days ago.  Plan: 1.  Continue Cogentin 1 mg p.o. twice daily for side effects of medications. 2.  Stop Thorazine 3.  Continue Pepcid 20 mg p.o. twice daily for gastric protection. 4.  Restart Haldol 10 mg p.o. or IM 3 times daily for psychosis. 5.  Continue Zyprexa Zydis agitation protocol. 6.  Social work will send a referral to 47 given her psychiatric history. 7.  Disposition planning-in progress.  [employer], MD 03/11/2021, 12:45 PM

## 2021-03-11 NOTE — Progress Notes (Signed)
Progress note    03/11/21 0810  Psych Admission Type (Psych Patients Only)  Admission Status Involuntary  Psychosocial Assessment  Patient Complaints Anxiety;Depression;Suspiciousness;Worrying  Eye Contact Brief;Darting  Facial Expression Anxious;Pensive;Worried  Affect Anxious;Preoccupied  Museum/gallery curator;Slow  Interaction Avoidant;Cautious;Forwards little;Guarded  Motor Activity Slow  Appearance/Hygiene Unremarkable  Behavior Characteristics Cooperative;Appropriate to situation;Anxious  Mood Anxious;Suspicious;Preoccupied;Pleasant  Thought Administrator, sports thinking  Content Preoccupation;Paranoia  Delusions Paranoid  Perception Hallucinations  Hallucination Auditory  Judgment Limited  Confusion Mild  Danger to Self  Current suicidal ideation? Denies  Danger to Others  Danger to Others None reported or observed

## 2021-03-11 NOTE — Progress Notes (Signed)
Pt continues to respond on the unit. Pt keeps to herself much of the evening.     03/11/21 2300  Psych Admission Type (Psych Patients Only)  Admission Status Involuntary  Psychosocial Assessment  Patient Complaints Anxiety;Suspiciousness  Eye Contact Brief;Darting  Facial Expression Anxious;Pensive;Worried  Affect Anxious;Preoccupied  Museum/gallery curator;Slow  Interaction Avoidant;Cautious;Forwards little;Guarded  Motor Activity Slow  Appearance/Hygiene Unremarkable  Behavior Characteristics Cooperative  Mood Anxious;Suspicious  Thought Process  Coherency Concrete thinking  Content Preoccupation;Paranoia  Delusions Paranoid  Perception Hallucinations  Hallucination Auditory  Judgment Limited  Confusion Mild  Danger to Self  Current suicidal ideation? Denies  Danger to Others  Danger to Others None reported or observed

## 2021-03-11 NOTE — BHH Counselor (Signed)
CSW completed verbal screening with [facility] and completed and faxed referral.    Ruthann Cancer MSW, LCSW Clincal Social Worker  [facility]

## 2021-03-11 NOTE — Plan of Care (Signed)
  Problem: Education: Goal: Ability to state activities that reduce stress will improve Outcome: Progressing   Problem: Coping: Goal: Ability to identify and develop effective coping behavior will improve Outcome: Progressing   Problem: Self-Concept: Goal: Ability to identify factors that promote anxiety will improve Outcome: Progressing   

## 2021-03-11 NOTE — Progress Notes (Signed)
   03/11/21 0500  Sleep  Number of Hours 3

## 2021-03-11 NOTE — Tx Team (Signed)
Interdisciplinary Treatment and Diagnostic Plan Update  03/11/2021 Time of Session: 10am [name] [name] MRN: [id number]  Principal Diagnosis: <principal problem not specified>  Secondary Diagnoses: Active Problems:   Schizophrenia (HCC)   Current Medications:  Current Facility-Administered Medications  Medication Dose Route Frequency Provider Last Rate Last Admin  . acetaminophen (TYLENOL) tablet 650 mg  650 mg Oral Q6H PRN Antonieta Pert, MD      . alum & mag hydroxide-simeth (MAALOX/MYLANTA) 200-200-20 MG/5ML suspension 30 mL  30 mL Oral Q4H PRN Antonieta Pert, MD   30 mL at 03/11/21 0854  . benztropine (COGENTIN) tablet 1 mg  1 mg Oral BID Antonieta Pert, MD   1 mg at 03/11/21 0810  . famotidine (PEPCID) tablet 20 mg  20 mg Oral BID Antonieta Pert, MD   20 mg at 03/11/21 0810  . feeding supplement (ENSURE ENLIVE / ENSURE PLUS) liquid 237 mL  237 mL Oral BID BM Antonieta Pert, MD   237 mL at 03/11/21 1033  . haloperidol (HALDOL) tablet 10 mg  10 mg Oral TID Antonieta Pert, MD      . OLANZapine zydis [facility]) disintegrating tablet 10 mg  10 mg Oral Q8H PRN Antonieta Pert, MD       And  . LORazepam (ATIVAN) tablet 1 mg  1 mg Oral Q6H PRN Antonieta Pert, MD       And  . ziprasidone (GEODON) injection 20 mg  20 mg Intramuscular Q6H PRN Antonieta Pert, MD      . magnesium hydroxide (MILK OF MAGNESIA) suspension 30 mL  30 mL Oral Daily PRN Antonieta Pert, MD      . risperiDONE (RISPERDAL M-TABS) disintegrating tablet 4 mg  4 mg Oral BID Antonieta Pert, MD   4 mg at 03/11/21 8250  . traZODone (DESYREL) tablet 150 mg  150 mg Oral QHS Antonieta Pert, MD       PTA Medications: Medications Prior to Admission  Medication Sig Dispense Refill Last Dose  . benztropine (COGENTIN) 1 MG tablet Take 1 tablet (1 mg total) by mouth 2 (two) times daily. 60 tablet 0   . chlorproMAZINE (THORAZINE) 50 MG tablet Take 1 tablet by mouth in the morning and at  bedtime.     . famotidine (PEPCID) 20 MG tablet Take 20 mg by mouth 2 (two) times daily as needed.     . haloperidol (HALDOL) 5 MG tablet Take 1 tablet (5 mg total) by mouth 2 (two) times daily.     . haloperidol decanoate (HALDOL DECANOATE) 100 MG/ML injection Inject 100 mg into the muscle every 28 (twenty-eight) days.     . vitamin C (ASCORBIC ACID) 500 MG tablet Take 500 mg by mouth daily.     . Vitamin D, Cholecalciferol, 25 MCG (1000 UT) TABS Take 1 tablet by mouth daily.       Patient Stressors: Health problems  Patient Strengths: Motivation for treatment/growth Supportive family/friends  Treatment Modalities: Medication Management, Group therapy, Case management,  1 to 1 session with clinician, Psychoeducation, Recreational therapy.   Physician Treatment Plan for Primary Diagnosis: <principal problem not specified> Long Term Goal(s): Improvement in symptoms so as ready for discharge Improvement in symptoms so as ready for discharge   Short Term Goals: Ability to identify changes in lifestyle to reduce recurrence of condition will improve Ability to verbalize feelings will improve Ability to disclose and discuss suicidal ideas Ability to demonstrate self-control will improve Ability  to identify and develop effective coping behaviors will improve Ability to maintain clinical measurements within normal limits will improve Compliance with prescribed medications will improve Ability to identify changes in lifestyle to reduce recurrence of condition will improve Ability to verbalize feelings will improve Ability to disclose and discuss suicidal ideas Ability to demonstrate self-control will improve Ability to identify and develop effective coping behaviors will improve Ability to maintain clinical measurements within normal limits will improve Compliance with prescribed medications will improve  Medication Management: Evaluate patient's response, side effects, and tolerance of  medication regimen.  Therapeutic Interventions: 1 to 1 sessions, Unit Group sessions and Medication administration.  Evaluation of Outcomes: Progressing  Physician Treatment Plan for Secondary Diagnosis: Active Problems:   Schizophrenia (HCC)  Long Term Goal(s): Improvement in symptoms so as ready for discharge Improvement in symptoms so as ready for discharge   Short Term Goals: Ability to identify changes in lifestyle to reduce recurrence of condition will improve Ability to verbalize feelings will improve Ability to disclose and discuss suicidal ideas Ability to demonstrate self-control will improve Ability to identify and develop effective coping behaviors will improve Ability to maintain clinical measurements within normal limits will improve Compliance with prescribed medications will improve Ability to identify changes in lifestyle to reduce recurrence of condition will improve Ability to verbalize feelings will improve Ability to disclose and discuss suicidal ideas Ability to demonstrate self-control will improve Ability to identify and develop effective coping behaviors will improve Ability to maintain clinical measurements within normal limits will improve Compliance with prescribed medications will improve     Medication Management: Evaluate patient's response, side effects, and tolerance of medication regimen.  Therapeutic Interventions: 1 to 1 sessions, Unit Group sessions and Medication administration.  Evaluation of Outcomes: Progressing   RN Treatment Plan for Primary Diagnosis: <principal problem not specified> Long Term Goal(s): Knowledge of disease and therapeutic regimen to maintain health will improve  Short Term Goals: Ability to demonstrate self-control, Ability to participate in decision making will improve and Ability to verbalize feelings will improve  Medication Management: RN will administer medications as ordered by provider, will assess and  evaluate patient's response and provide education to patient for prescribed medication. RN will report any adverse and/or side effects to prescribing provider.  Therapeutic Interventions: 1 on 1 counseling sessions, Psychoeducation, Medication administration, Evaluate responses to treatment, Monitor vital signs and CBGs as ordered, Perform/monitor CIWA, COWS, AIMS and Fall Risk screenings as ordered, Perform wound care treatments as ordered.  Evaluation of Outcomes: Progressing   LCSW Treatment Plan for Primary Diagnosis: <principal problem not specified> Long Term Goal(s): Safe transition to appropriate next level of care at discharge, Engage patient in therapeutic group addressing interpersonal concerns.  Short Term Goals: Engage patient in aftercare planning with referrals and resources, Increase social support and Increase ability to appropriately verbalize feelings  Therapeutic Interventions: Assess for all discharge needs, 1 to 1 time with Social worker, Explore available resources and support systems, Assess for adequacy in community support network, Educate family and significant other(s) on suicide prevention, Complete Psychosocial Assessment, Interpersonal group therapy.  Evaluation of Outcomes: Progressing   Progress in Treatment: Attending groups: Yes. Participating in groups: Yes. Taking medication as prescribed: Yes. Toleration medication: Yes. Family/Significant other contact made: Yes, individual(s) contacted:  pt declined consents Patient understands diagnosis: Yes. Discussing patient identified problems/goals with staff: Yes. Medical problems stabilized or resolved: No. Denies suicidal/homicidal ideation: No. Issues/concerns per patient self-inventory: Yes. Other: None  New problem(s) identified: No, Describe:  None  New Short Term/Long Term Goal(s):medication stabilization, elimination of SI thoughts, development of comprehensive mental wellness plan.  Patient  Goals:  "take my medications."  Discharge Plan or Barriers: Patient recently admitted. CSW will continue to follow and assess for appropriate referrals and possible discharge planning.  Reason for Continuation of Hospitalization: Delusions  Hallucinations Medication stabilization  Estimated Length of Stay: 3-5 days  Attendees: Patient: [name] [name] 03/11/2021   Physician: Clifton Custard, MD 03/11/2021   Nursing:  03/11/2021   RN Care Manager: 03/11/2021   Social Worker: Fredirick Lathe, LCSWA 03/11/2021   Recreational Therapist:  03/11/2021   Other:  03/11/2021   Other:  03/11/2021   Other: 03/11/2021       Scribe for Treatment Team: Felizardo Hoffmann, LCSWA 03/11/2021 11:47 AM

## 2021-03-11 NOTE — Progress Notes (Signed)
Recreation Therapy Notes  Date: 5.6.22 Time: 1000 Location: 500 Hall Dayroom   Group Topic: Self Esteem    Goal Area(s) Addresses:  Patient will appropriately identify what self esteem is.  Patient will create a shield of armor describing themselves.  Patient will successfully identify positive attributes about themselves.  Patient will acknowledge benefit of improved self-esteem.  Patient will follow instructions on 1st prompt.    Behavioral Response: Engaged  Intervention / Activity: Self-Esteem Shield. Patient attended a recreation therapy group session focused on self esteem. Patient identified what self esteem is, and why it is important to have high self esteem during group discussion.   The Upper Left quadrant- People/things they value The Upper Right quadrant- Lessons they have learned thus far The Lower Left quadrant- Qualities that make them unique The Lower Right quadrant- One goal they want to work towards    Patients were provided sheets with the shield printed on them and colored pencils, markers and crayons to complete the activity.  Patients were asked to share their top 3 quadrants with the group. Patients were debriefed on the importance of healthy self esteem.   Education: Self esteem, Communication, Positive self-talk, Discharge Planning   Education Outcome: Acknowledges education/Verbalizes understanding of Education/In group clarification offered/Needs further education   Comments: Pt stated bad self esteem can be influenced by "people talking about you".  Pt was flat but attentive. Pt expressed keeping clean and good nutrition as things she values.  Lessons pt has learned were "don't tell all your business to people and leave people that don't like you alone".  Pt stated she wants to finish school as the goal she wants to work towards.     [name], LRT/CTRS    [name] A 03/11/2021 11:19 AM

## 2021-03-11 NOTE — BHH Counselor (Signed)
LCSW Group Therapy Note   03/11/2021 1:15pm Type of Therapy and Topic:  Group Therapy:  Identity and Relationships  Participation Level:  Did Not Attend  Description of Group: Using the 'Ungame' patients were guided to express themselves about a variety of topics. Selected cards for this game included identity and relationships. Patients were able to discuss dealing with positive and negative situations, identifying supports, and other ways to understand your identity. Patients shared unique viewpoints but often had similar characteristics.  Patients encouraged to use this dialogue to develop goals and supports for future progress. Therapeutic Goals: 1. Patient will discuss 2 positive and 2 negative situations in their life prior to admission 2. Patient will identify 2 positive support persons in their home environment 3. Patient will explore setting goals for themselves and identify supports they need to achieve these goals 4. Patient will demonstrate empathy for others in the group by responding with positive affirmations of group members shares.   Summary of Patient Progress: Did not attend    Therapeutic Modalities: Motivational Interviewing Cognitive Behavioral Therapy  Kaylor Simenson A Kambre Messner, LCSW 03/11/2021 12:46 PM  

## 2021-03-12 LAB — URINALYSIS, COMPLETE (UACMP) WITH MICROSCOPIC
Bilirubin Urine: NEGATIVE
Glucose, UA: NEGATIVE mg/dL
Hgb urine dipstick: NEGATIVE
Ketones, ur: NEGATIVE mg/dL
Leukocytes,Ua: NEGATIVE
Nitrite: NEGATIVE
Protein, ur: NEGATIVE mg/dL
Specific Gravity, Urine: 1.01 (ref 1.005–1.030)
pH: 7 (ref 5.0–8.0)

## 2021-03-12 MED ORDER — SENNOSIDES-DOCUSATE SODIUM 8.6-50 MG PO TABS
1.0000 | ORAL_TABLET | Freq: Every day | ORAL | Status: DC
  Administered 2021-03-12 – 2021-03-20 (×9): 1 via ORAL
  Filled 2021-03-12 (×12): qty 1

## 2021-03-12 MED ORDER — HALOPERIDOL 5 MG PO TABS
15.0000 mg | ORAL_TABLET | Freq: Every day | ORAL | Status: DC
  Administered 2021-03-12 – 2021-03-20 (×9): 15 mg via ORAL
  Filled 2021-03-12 (×11): qty 3

## 2021-03-12 MED ORDER — BENZTROPINE MESYLATE 1 MG PO TABS: 1.0000 mg | ORAL_TABLET | Freq: Two times a day (BID) | ORAL | Status: DC | PRN

## 2021-03-12 MED ORDER — ONDANSETRON 4 MG PO TBDP
4.0000 mg | ORAL_TABLET | Freq: Three times a day (TID) | ORAL | Status: DC | PRN
  Administered 2021-03-12: 4 mg via ORAL
  Filled 2021-03-12: qty 1

## 2021-03-12 MED ORDER — RISPERIDONE 2 MG PO TBDP
6.0000 mg | ORAL_TABLET | Freq: Every day | ORAL | Status: DC
  Administered 2021-03-12 – 2021-03-20 (×9): 6 mg via ORAL
  Filled 2021-03-12 (×12): qty 3

## 2021-03-12 MED ORDER — HALOPERIDOL 5 MG PO TABS
10.0000 mg | ORAL_TABLET | Freq: Two times a day (BID) | ORAL | Status: DC
  Administered 2021-03-13 – 2021-03-21 (×16): 10 mg via ORAL
  Filled 2021-03-12 (×22): qty 2

## 2021-03-12 MED ORDER — RISPERIDONE 2 MG PO TBDP
2.0000 mg | ORAL_TABLET | Freq: Every day | ORAL | Status: DC
  Administered 2021-03-13 – 2021-03-21 (×9): 2 mg via ORAL
  Filled 2021-03-12 (×11): qty 1

## 2021-03-12 MED ORDER — POLYETHYLENE GLYCOL 3350 17 G PO PACK
17.0000 g | PACK | Freq: Every day | ORAL | Status: DC
  Administered 2021-03-12 – 2021-03-15 (×4): 17 g via ORAL
  Filled 2021-03-12 (×13): qty 1

## 2021-03-12 NOTE — Progress Notes (Signed)
Pt up to the nursing station asking for irrelevant things , pt asked for a pair of socks so she could lay down and go to sleep. Pt was informed that she would get another pair at 6 am since she was already given a pair before she went to bed. Pt continued to be talking to people not seen.

## 2021-03-12 NOTE — Progress Notes (Signed)
Pt is soft spoken and appears to be responding to internal stimuli. Pt is paranoid and guarded.  RN established rapport with pt and assessed for needs and concerns.  Pt took all of her medications this morning except Risperdal which she eventually took this afternoon.  Pt complains of stomach pain.  Pt reported that she had a bowel movement this morning.  Pt is currently in her bed with her eyes closed.  Pt remains safe on the unit with q 15 min room checks.

## 2021-03-12 NOTE — BHH Group Notes (Signed)
Adult Psychoeducational Group Note  Date:  03/12/2021 Time:  6:08 PM  Group Topic/Focus:  Goals Group:   The focus of this group is to help patients establish daily goals to achieve during treatment and discuss how the patient can incorporate goal setting into their daily lives to aide in recovery.  Participation Level:  Did Not Attend  [facility] 03/12/2021, 6:08 PM

## 2021-03-12 NOTE — Progress Notes (Signed)
Pt reports stomach is feeling better after taking Zofran.  Pt asks for apple pie and takes it back to her room.

## 2021-03-12 NOTE — Progress Notes (Signed)
Pt continues to be restless and paranoid, but pt was noted by the 15 min check sleep , sleeping over 4 hrs during the day.

## 2021-03-12 NOTE — Progress Notes (Signed)
Pt requesting medication for constipation.  Will adminster Miralax after pt eats dinner, per pt request.

## 2021-03-12 NOTE — Progress Notes (Signed)
Pt slept 4 hrs during the day   03/12/21 0500  Sleep  Number of Hours 3.25

## 2021-03-12 NOTE — BHH Group Notes (Signed)
.  Psychoeducational Group Note  Date: 03/12/2021 Time: 0900-1000    Goal Setting   Purpose of Group: This group helps to provide patients with the steps of setting a goal that is specific, measurable, attainable, realistic and time specific. A discussion on how we keep ourselves stuck with negative self talk.    Participation Level:  Active  Participation Quality:  Appropriate  Affect:  Appropriate  Cognitive:  Appropriate  Insight:  Improving  Engagement in Group:  Engaged  Additional Comments:  Marland KitchenMarland KitchenPt states that she is here due to paranoia. Throughout the group, Pt did not speak other that to state why she is her andthat her energy was a level 5/10. Pt kept her eyes open the whole time and never took her eyes off of this speaker.  Dione Housekeeper

## 2021-03-12 NOTE — Progress Notes (Signed)
Pt received PRN Maalox per MAR, pt continued to be talking to people not seen by Clinical research associate

## 2021-03-12 NOTE — Progress Notes (Signed)
[facility] MD Progress Note  03/12/2021 1:39 PM Karynn Deblasi  MRN:  [id number] Subjective:  Patient is a 45 year old female with a past psychiatric history significant for paranoid schizophrenia who originally presented to the behavioral health urgent care center on 02/28/2021 with chief complaint that she was "having problems with God".  Objective: Patient is seen and examined.  Patient is a 45 year old female with the above-stated past psychiatric history who is seen in follow-up.  She actually is a little bit better today.  She does complain of abdominal pain.  She complained of abdominal pain early this a.m. as well.  She denied any urinary symptoms.  We had switched her from Thorazine to Haldol yesterday.  She had received long-acting Haldol decanoate at the [facility] prior to being transferred to our facility.  Her disorganization has improved to a degree.  She admitted to continued auditory hallucinations but were less prominent.  She was actually asking today when she might be able to be discharged.  She denied any suicidal or homicidal ideation.  Her vital signs are stable, she is afebrile.  Her sleep continues to be poor at 3.25 hours last night.  No new laboratories.  With regards to her abdominal pain I tried to ask her about constipation and she had a hard time discussing that.  It does sound like she is constipated to a degree.  Principal Problem: <principal problem not specified> Diagnosis: Active Problems:   Schizophrenia (HCC)  Total Time spent with patient: 20 minutes  Past Psychiatric History: See admission H&P  Past Medical History:  Past Medical History:  Diagnosis Date  . Arthritis   . Depression   . Hallucinations   . Paranoid schizophrenia (HCC)   . Schizoaffective disorder (HCC)    History reviewed. No pertinent surgical history. Family History: History reviewed. No pertinent family history. Family Psychiatric  History: See admission H&P Social History:  Social History    Substance and Sexual Activity  Alcohol Use Not Currently   Comment: She denies      Social History   Substance and Sexual Activity  Drug Use Not Currently   Comment: She denies     Social History   Socioeconomic History  . Marital status: Single    Spouse name: Not on file  . Number of children: Not on file  . Years of education: Not on file  . Highest education level: Not on file  Occupational History  . Occupation: Disability  Tobacco Use  . Smoking status: Current Every Day Smoker    Packs/day: 1.00    Types: Cigarettes  . Smokeless tobacco: Never Used  Vaping Use  . Vaping Use: Never used  Substance and Sexual Activity  . Alcohol use: Not Currently    Comment: She denies   . Drug use: Not Currently    Comment: She denies   . Sexual activity: Not Currently  Other Topics Concern  . Not on file  Social History Narrative   Pt stated that she lives in [place], and that she lives alone.  Pt receives outpatient psychiatric resources through [employer].   Social Determinants of Health   Financial Resource Strain: Not on file  Food Insecurity: Not on file  Transportation Needs: Not on file  Physical Activity: Not on file  Stress: Not on file  Social Connections: Not on file   Additional Social History:  Sleep: Poor  Appetite:  Fair  Current Medications: Current Facility-Administered Medications  Medication Dose Route Frequency Provider Last Rate Last Admin  . acetaminophen (TYLENOL) tablet 650 mg  650 mg Oral Q6H PRN Antonieta Pert, MD   650 mg at 03/12/21 0532  . alum & mag hydroxide-simeth (MAALOX/MYLANTA) 200-200-20 MG/5ML suspension 30 mL  30 mL Oral Q4H PRN Antonieta Pert, MD   30 mL at 03/12/21 1236  . benztropine (COGENTIN) tablet 1 mg  1 mg Oral BID Antonieta Pert, MD   1 mg at 03/12/21 0906  . famotidine (PEPCID) tablet 20 mg  20 mg Oral BID Antonieta Pert, MD   20 mg at 03/12/21 0906  .  feeding supplement (ENSURE ENLIVE / ENSURE PLUS) liquid 237 mL  237 mL Oral BID BM Antonieta Pert, MD   237 mL at 03/12/21 1337  . haloperidol (HALDOL) tablet 10 mg  10 mg Oral TID Antonieta Pert, MD   10 mg at 03/12/21 1144  . OLANZapine zydis (ZYPREXA) disintegrating tablet 10 mg  10 mg Oral Q8H PRN Antonieta Pert, MD       And  . LORazepam (ATIVAN) tablet 1 mg  1 mg Oral Q6H PRN Antonieta Pert, MD       And  . ziprasidone (GEODON) injection 20 mg  20 mg Intramuscular Q6H PRN Antonieta Pert, MD      . magnesium hydroxide (MILK OF MAGNESIA) suspension 30 mL  30 mL Oral Daily PRN Antonieta Pert, MD      . risperiDONE (RISPERDAL M-TABS) disintegrating tablet 4 mg  4 mg Oral BID Antonieta Pert, MD   4 mg at 03/11/21 2120  . traZODone (DESYREL) tablet 150 mg  150 mg Oral QHS Antonieta Pert, MD   150 mg at 03/11/21 2119    Lab Results: No results found for this or any previous visit (from the past 48 hour(s)).  Blood Alcohol level:  Lab Results  Component Value Date   ETH <10 05/19/2020   ETH <10 05/11/2020    Metabolic Disorder Labs: Lab Results  Component Value Date   HGBA1C 5.1 02/28/2021   MPG 99.67 02/28/2021   MPG 96.8 05/19/2020   Lab Results  Component Value Date   PROLACTIN 28.9 (H) 06/26/2018   Lab Results  Component Value Date   CHOL 138 02/28/2021   TRIG 69 02/28/2021   HDL 50 02/28/2021   CHOLHDL 2.8 02/28/2021   VLDL 14 02/28/2021   LDLCALC 74 02/28/2021   LDLCALC 121 (H) 05/19/2020    Physical Findings: AIMS: Facial and Oral Movements Muscles of Facial Expression: None, normal Lips and Perioral Area: None, normal Jaw: None, normal Tongue: None, normal,Extremity Movements Upper (arms, wrists, hands, fingers): None, normal Lower (legs, knees, ankles, toes): None, normal, Trunk Movements Neck, shoulders, hips: None, normal, Overall Severity Severity of abnormal movements (highest score from questions above): None,  normal Incapacitation due to abnormal movements: None, normal Patient's awareness of abnormal movements (rate only patient's report): No Awareness, Dental Status Current problems with teeth and/or dentures?: No Does patient usually wear dentures?: No  CIWA:    COWS:     Musculoskeletal: Strength & Muscle Tone: within normal limits Gait & Station: normal Patient leans: N/A  Psychiatric Specialty Exam:  Presentation  General Appearance: Disheveled  Eye Contact:Fair  Speech:Garbled  Speech Volume:Decreased  Handedness:Right   Mood and Affect  Mood:Dysphoric  Affect:Flat   Thought Process  Thought Processes:Disorganized  Descriptions of Associations:Loose  Orientation:Partial  Thought Content:Delusions; Paranoid Ideation  History of Schizophrenia/Schizoaffective disorder:Yes  Duration of Psychotic Symptoms:Greater than six months  Hallucinations:Hallucinations: Auditory  Ideas of Reference:Delusions; Paranoia  Suicidal Thoughts:Suicidal Thoughts: No  Homicidal Thoughts:Homicidal Thoughts: No   Sensorium  Memory:Immediate Poor; Recent Poor; Remote Poor  Judgment:Impaired  Insight:Lacking   Executive Functions  Concentration:Poor  Attention Span:Poor  Recall:Poor  Fund of Knowledge:Poor  Language:Poor   Psychomotor Activity  Psychomotor Activity:Psychomotor Activity: Increased   Assets  Assets:Desire for Improvement; Resilience   Sleep  Sleep:Sleep: Poor Number of Hours of Sleep: 4.5    Physical Exam: Physical Exam Vitals and nursing note reviewed.  Constitutional:      Appearance: Normal appearance.  HENT:     Head: Normocephalic and atraumatic.  Pulmonary:     Effort: Pulmonary effort is normal.  Abdominal:     Tenderness: There is abdominal tenderness.  Neurological:     General: No focal deficit present.     Mental Status: She is alert and oriented to person, place, and time.    ROS Blood pressure 117/77, pulse  93, temperature 97.6 F (36.4 C), temperature source Oral, resp. rate 16, height 4\' 10"  (1.473 m), weight 42.2 kg, SpO2 99 %. Body mass index is 19.44 kg/m.   Treatment Plan Summary: Daily contact with patient to assess and evaluate symptoms and progress in treatment, Medication management and Plan : Patient is seen and examined.  Patient is a 45 year old female with the above-stated past psychiatric history who is seen in follow-up.   Diagnosis: 1.  Schizophrenia; most probably undifferentiated. 2.  Tachycardia 3.  Possible constipation. 4.  Abdominal pain  Pertinent findings on examination today: 1.  Patient remains psychotic but less so, disorganization also has decreased. 2.  Her sleep remains poor. 3.  Abdominal pain.  Plan: 1. Change Cogentin to 1 mg p.o. twice daily as needed tremor.  This is in case the Cogentin is leading to some degree of constipation. 2.  Continue Pepcid 20 mg p.o. twice daily for gastric protection. 3.  Change haloperidol to 10 mg p.o. twice daily and 15 mg p.o. nightly for psychosis. 4.  Continue olanzapine Zydis agitation protocol as needed. 5.  Change Risperdal to 2 mg p.o. daily and 6 mg p.o. nightly for psychosis, mood stability and sleep. 6.  Change trazodone to 200 mg p.o. nightly for insomnia. 7.  Add MiraLAX 17 g in 8 ounces of water p.o. daily for constipation. 8.  Add Zofran 4 mg p.o. every 8 hours as needed nausea 9.  Order urinalysis for a.m. tomorrow. 10.  If abdominal pain continues we may get a KUB sometime tomorrow. 11.  Disposition planning-in progress. 47, MD 03/12/2021, 1:39 PM

## 2021-03-12 NOTE — Progress Notes (Signed)
Pt complained of abdominal discomfort, pt given PRN Tylenol per [facility]

## 2021-03-13 NOTE — Progress Notes (Signed)
Adult Psychoeducational Group Note  Date:  03/13/2021 Time:  12:42 AM  Group Topic/Focus:  Wrap-Up Group:   The focus of this group is to help patients review their daily goal of treatment and discuss progress on daily workbooks.  Participation Level:  Minimal  Participation Quality:  Drowsy  Affect:  Flat and Irritable  Cognitive:  Disorganized  Insight: Limited  Engagement in Group:  Limited and Poor  Modes of Intervention:  Discussion  Additional Comments: Pt stated her goal for today was to focus on her treatment plan. Pt stated she accomplished her goal today. Pt stated she talk with her doctor but did not get a chance to talk with her social worker about her care today.  Pt rated her overall day a 7 out of 10. Pt stated she made no calls today. Pt stated she felt better about herself today. Pt stated she was able to attend all meals. Pt stated she attend all groups held today. Pt stated she took all medications provided today. Pt stated her appetite was fair today. Pt rated sleep last night poor. Pt stated the goal tonight was to get some rest. Pt stated she had some physical pain tonight. PT stated she had had some severe stomach pain. PT rated her stomach pain a 7 on the pain level scale.  Pt denied visual hallucinations tonight and auditory issues tonight.  Pt denies thoughts of harming herself or others. Pt stated she would alert staff if anything changed.   Felipa Furnace 03/13/2021, 12:42 AM

## 2021-03-13 NOTE — Progress Notes (Signed)
D:  Patient's self inventory sheet, patient sleeps good, sleep medication helpful.  Good appetite, normal energy level, good concentration.  Rated depression 3, hopeless 5.  Denied withdrawals.  Denied SI.  Denied physical problems.  Physical pain, stomach, just a little, worst pain #3 in past 24 hours.  Pain med helpful.  Goal is take med and work discharge.  Plans to attend groups.  Has furniture delivery this week.  No discharge plans. A:  Medications administered per MD orders.  Emotional support and encouragement given patient. R:  Denied SI and HI, contracts for safety.  Denied A/V hallucinations.  Safety maintained with 15 minute checks.

## 2021-03-13 NOTE — Progress Notes (Signed)
[facility] MD Progress Note  03/13/2021 12:26 PM Diavion Labrador  MRN:  [id number] Subjective: Patient is a 45 year old female with a past psychiatric history significant for paranoid schizophrenia who originally presented to the behavioral health urgent care center on 02/28/2021 with chief complaint that she was "having problems with God".   Objective: Patient is seen and examined.  Patient is a 45 year old female with the above-stated past psychiatric history who is seen in follow-up.  She seems a bit less disorganized today.  She was able to continue somewhat of a decent conversation.  She stated that she does have family both in [place] as well as [place].  She stated she does not have phone numbers for her family in [place].  She stated she does not want to have any contact with her relatives in [place].  She continues to contend that despite the fact she is only been back in [place] for 2 weeks that she has either a condominium or her home that she stays at.  She stated that is where she is going to go after discharge.  She stated she was not having abdominal pain this morning.  She was started on MiraLAX as well as Peri-Colace yesterday for possible constipation pain.  She did admit to continued auditory hallucinations but less so than they have been, and she still remains paranoid.  Her vital signs are stable, she is afebrile.  She slept 7.25 hours last night.  She was compliant with both the haloperidol as well as the Risperdal.  Review of nursing notes from last night reported that she had stated she had decreased auditory hallucinations and was noted to be calm and cooperative.  Urinalysis from 5/7 showed rare bacteria but 0-5 white blood cells.  Principal Problem: <principal problem not specified> Diagnosis: Active Problems:   Schizophrenia (HCC)  Total Time spent with patient: 20 minutes  Past Psychiatric History: See admission H&P  Past Medical History:  Past Medical History:   Diagnosis Date  . Arthritis   . Depression   . Hallucinations   . Paranoid schizophrenia (HCC)   . Schizoaffective disorder (HCC)    History reviewed. No pertinent surgical history. Family History: History reviewed. No pertinent family history. Family Psychiatric  History: See admission H&P Social History:  Social History   Substance and Sexual Activity  Alcohol Use Not Currently   Comment: She denies      Social History   Substance and Sexual Activity  Drug Use Not Currently   Comment: She denies     Social History   Socioeconomic History  . Marital status: Single    Spouse name: Not on file  . Number of children: Not on file  . Years of education: Not on file  . Highest education level: Not on file  Occupational History  . Occupation: Disability  Tobacco Use  . Smoking status: Current Every Day Smoker    Packs/day: 1.00    Types: Cigarettes  . Smokeless tobacco: Never Used  Vaping Use  . Vaping Use: Never used  Substance and Sexual Activity  . Alcohol use: Not Currently    Comment: She denies   . Drug use: Not Currently    Comment: She denies   . Sexual activity: Not Currently  Other Topics Concern  . Not on file  Social History Narrative   Pt stated that she lives in [place], and that she lives alone.  Pt receives outpatient psychiatric resources through [employer] [employer].   Social Determinants of  Health   Financial Resource Strain: Not on file  Food Insecurity: Not on file  Transportation Needs: Not on file  Physical Activity: Not on file  Stress: Not on file  Social Connections: Not on file   Additional Social History:                         Sleep: Good  Appetite:  Fair  Current Medications: Current Facility-Administered Medications  Medication Dose Route Frequency Provider Last Rate Last Admin  . acetaminophen (TYLENOL) tablet 650 mg  650 mg Oral Q6H PRN Antonieta Pert, MD   650 mg at 03/12/21 0532  . alum & mag  hydroxide-simeth (MAALOX/MYLANTA) 200-200-20 MG/5ML suspension 30 mL  30 mL Oral Q4H PRN Antonieta Pert, MD   30 mL at 03/12/21 1236  . benztropine (COGENTIN) tablet 1 mg  1 mg Oral BID PRN Antonieta Pert, MD      . famotidine (PEPCID) tablet 20 mg  20 mg Oral BID Antonieta Pert, MD   20 mg at 03/13/21 0902  . feeding supplement (ENSURE ENLIVE / ENSURE PLUS) liquid 237 mL  237 mL Oral BID BM Antonieta Pert, MD   237 mL at 03/13/21 1100  . haloperidol (HALDOL) tablet 10 mg  10 mg Oral BID Antonieta Pert, MD   10 mg at 03/13/21 0902  . haloperidol (HALDOL) tablet 15 mg  15 mg Oral QHS Antonieta Pert, MD   15 mg at 03/12/21 2043  . OLANZapine zydis (ZYPREXA) disintegrating tablet 10 mg  10 mg Oral Q8H PRN Antonieta Pert, MD       And  . LORazepam (ATIVAN) tablet 1 mg  1 mg Oral Q6H PRN Antonieta Pert, MD       And  . ziprasidone (GEODON) injection 20 mg  20 mg Intramuscular Q6H PRN Antonieta Pert, MD      . magnesium hydroxide (MILK OF MAGNESIA) suspension 30 mL  30 mL Oral Daily PRN Antonieta Pert, MD      . ondansetron (ZOFRAN-ODT) disintegrating tablet 4 mg  4 mg Oral Q8H PRN Antonieta Pert, MD   4 mg at 03/12/21 1544  . polyethylene glycol (MIRALAX / GLYCOLAX) packet 17 g  17 g Oral Daily Antonieta Pert, MD   17 g at 03/13/21 8101  . risperiDONE (RISPERDAL M-TABS) disintegrating tablet 2 mg  2 mg Oral Daily Antonieta Pert, MD   2 mg at 03/13/21 0902  . risperiDONE (RISPERDAL M-TABS) disintegrating tablet 6 mg  6 mg Oral QHS Antonieta Pert, MD   6 mg at 03/12/21 2042  . senna-docusate (Senokot-S) tablet 1 tablet  1 tablet Oral QHS Antonieta Pert, MD   1 tablet at 03/12/21 2043  . traZODone (DESYREL) tablet 150 mg  150 mg Oral QHS Antonieta Pert, MD   150 mg at 03/12/21 2043    Lab Results:  Results for orders placed or performed during the hospital encounter of 03/09/21 (from the past 48 hour(s))  Urinalysis, Complete w Microscopic  Urine, Unspecified Source     Status: Abnormal   Collection Time: 03/12/21  5:18 PM  Result Value Ref Range   Color, Urine YELLOW YELLOW   APPearance CLEAR CLEAR   Specific Gravity, Urine 1.010 1.005 - 1.030   pH 7.0 5.0 - 8.0   Glucose, UA NEGATIVE NEGATIVE mg/dL   Hgb urine dipstick NEGATIVE NEGATIVE   Bilirubin Urine  NEGATIVE NEGATIVE   Ketones, ur NEGATIVE NEGATIVE mg/dL   Protein, ur NEGATIVE NEGATIVE mg/dL   Nitrite NEGATIVE NEGATIVE   Leukocytes,Ua NEGATIVE NEGATIVE   RBC / HPF 0-5 0 - 5 RBC/hpf   WBC, UA 0-5 0 - 5 WBC/hpf   Bacteria, UA RARE (A) NONE SEEN   Squamous Epithelial / LPF 0-5 0 - 5    Comment: Performed at [facility], [address], [place], [place] [address]    Blood Alcohol level:  Lab Results  Component Value Date   ETH <10 05/19/2020   ETH <10 05/11/2020    Metabolic Disorder Labs: Lab Results  Component Value Date   HGBA1C 5.1 02/28/2021   MPG 99.67 02/28/2021   MPG 96.8 05/19/2020   Lab Results  Component Value Date   PROLACTIN 28.9 (H) 06/26/2018   Lab Results  Component Value Date   CHOL 138 02/28/2021   TRIG 69 02/28/2021   HDL 50 02/28/2021   CHOLHDL 2.8 02/28/2021   VLDL 14 02/28/2021   LDLCALC 74 02/28/2021   LDLCALC 121 (H) 05/19/2020    Physical Findings: AIMS: Facial and Oral Movements Muscles of Facial Expression: None, normal Lips and Perioral Area: None, normal Jaw: None, normal Tongue: None, normal,Extremity Movements Upper (arms, wrists, hands, fingers): None, normal Lower (legs, knees, ankles, toes): None, normal, Trunk Movements Neck, shoulders, hips: None, normal, Overall Severity Severity of abnormal movements (highest score from questions above): None, normal Incapacitation due to abnormal movements: None, normal Patient's awareness of abnormal movements (rate only patient's report): No Awareness, Dental Status Current problems with teeth and/or dentures?: No Does patient usually wear  dentures?: No  CIWA:    COWS:     Musculoskeletal: Strength & Muscle Tone: within normal limits Gait & Station: normal Patient leans: N/A  Psychiatric Specialty Exam:  Presentation  General Appearance: Disheveled  Eye Contact:Fair  Speech:Normal Rate  Speech Volume:Decreased  Handedness:Right   Mood and Affect  Mood:Dysphoric  Affect:Flat   Thought Process  Thought Processes:Goal Directed  Descriptions of Associations:Loose  Orientation:Partial  Thought Content:Delusions; Paranoid Ideation  History of Schizophrenia/Schizoaffective disorder:Yes  Duration of Psychotic Symptoms:Greater than six months  Hallucinations:Hallucinations: Auditory  Ideas of Reference:Delusions; Paranoia  Suicidal Thoughts:Suicidal Thoughts: No  Homicidal Thoughts:Homicidal Thoughts: No   Sensorium  Memory:Immediate Poor; Recent Poor; Remote Poor  Judgment:Impaired  Insight:Lacking   Executive Functions  Concentration:Fair  Attention Span:Fair  Recall:Fair  Fund of Knowledge:Fair  Language:Fair   Psychomotor Activity  Psychomotor Activity:Psychomotor Activity: Normal   Assets  Assets:Desire for Improvement; Resilience   Sleep  Sleep:Sleep: Good Number of Hours of Sleep: 7.25    Physical Exam: Physical Exam Vitals and nursing note reviewed.  HENT:     Head: Normocephalic and atraumatic.  Pulmonary:     Effort: Pulmonary effort is normal.  Neurological:     General: No focal deficit present.     Mental Status: She is alert and oriented to person, place, and time.    ROS Blood pressure 117/77, pulse 93, temperature 97.6 F (36.4 C), temperature source Oral, resp. rate 16, height 4\' 10"  (1.473 m), weight 42.2 kg, SpO2 99 %. Body mass index is 19.44 kg/m.   Treatment Plan Summary: Daily contact with patient to assess and evaluate symptoms and progress in treatment, Medication management and Plan : Patient is seen and examined.  Patient is a  45 year old female with the above-stated past psychiatric history who is seen in follow-up.   Diagnosis: 1.  Schizophrenia; most  probably undifferentiated. 2.  Tachycardia 3.  Possible constipation. 4.  Abdominal pain  Pertinent findings on examination today: 1.  Psychosis is still present but decreased as well as improvement in disorganization. 2.  Sleep is improved. 3.  Patient denies abdominal pain today.  Plan: 1.  Continue Cogentin 1 mg p.o. twice daily as needed tremors. 2.  Continue famotidine 20 mg p.o. twice daily for GERD. 3.  Continue haloperidol 10 mg p.o. twice daily and 15 mg p.o. nightly for psychosis and sleep. 4.  Continue Zyprexa Zydis agitation protocol as needed. 5.  Continue Zofran 4 mg p.o. every 8 hours as needed nausea and vomiting. 6.  Continue MiraLAX 17 g in 8 ounces of water p.o. daily for constipation. 7.  Continue Risperdal 2 mg p.o. daily and 6 mg p.o. nightly for psychosis, mood stability and sleep. 8.  Continue Senokot S1 tablet p.o. nightly for constipation. 9.  Continue trazodone 150 mg p.o. nightly for insomnia. 10.  Disposition planning-in progress.  Antonieta PertGreg Lawson Yena Tisby, MD 03/13/2021, 12:26 PM

## 2021-03-13 NOTE — Plan of Care (Signed)
  Problem: Role Relationship: Goal: Ability to communicate needs accurately will improve Outcome: Progressing Goal: Ability to interact with others will improve Outcome: Progressing   Problem: Safety: Goal: Ability to redirect hostility and anger into socially appropriate behaviors will improve Outcome: Progressing Goal: Ability to remain free from injury will improve Outcome: Progressing   Problem: Self-Care: Goal: Ability to participate in self-care as condition permits will improve Outcome: Progressing   Problem: Self-Concept: Goal: Will verbalize positive feelings about self Outcome: Progressing

## 2021-03-13 NOTE — [facility] Group Notes (Signed)
Adult Psychoeducational Group Not Date:  03/13/2021 Time:  0900-1045 Group Topic/Focus: PROGRESSIVE RELAXATION. A group where deep breathing is taught and tensing and relaxation muscle groups is used. Imagery is used as well.  Pts are asked to imagine 3 pillars that hold them up when they are not able to hold themselves up.  Participation Level:  Active  Participation Quality:  Appropriate  Affect:  Appropriate  Cognitive:  Oriented  Insight: Improving  Engagement in Group:  Engaged  Modes of Intervention:  Activity, Discussion, Education, and Support  Additional Comments:  Pt sat in the group and whispered to herself  through the whole class. She often tilts her head and talks to someone as iff she was trying to listen.  Dione Housekeeper

## 2021-03-13 NOTE — Progress Notes (Signed)
D: Pt denies SI/HI, reported decreased auditory hallucinations, she is calm and cooperative.  A: Pt was offered support and encouragement. Pt was given scheduled medications, no prns needed or requested for. Pt was encourage to attend groups. Q 15 minute checks were done for safety.   R:Pt attended evening wrap up group and interacted appropriately well with peers and staff. Pt is taking medication. Pt has no complaints.Pt receptive to treatment and safety maintained on unit.

## 2021-03-13 NOTE — Plan of Care (Signed)
Nurse discussed anxiety, depression and coping skills with patient.  

## 2021-03-13 NOTE — Progress Notes (Signed)
   03/13/21 0500  Sleep  Number of Hours 7.25

## 2021-03-13 NOTE — Progress Notes (Signed)
Pt continues to respond to internal stimuli, pt visible on the unit much of the evening. Pt given HS medications without incident    03/13/21 2100  Psych Admission Type (Psych Patients Only)  Admission Status Involuntary  Psychosocial Assessment  Patient Complaints Anxiety;Suspiciousness  Eye Contact Brief  Facial Expression Anxious;Worried  Affect Preoccupied  Chartered loss adjuster Avoidant  Motor Activity Slow  Appearance/Hygiene Unremarkable  Behavior Characteristics Cooperative;Restless  Mood Anxious;Suspicious  Thought Process  Coherency Concrete thinking  Content Paranoia  Delusions Paranoid  Perception Hallucinations  Hallucination Auditory  Judgment Limited  Confusion Mild  Danger to Self  Current suicidal ideation? Denies  Danger to Others  Danger to Others None reported or observed

## 2021-03-13 NOTE — Plan of Care (Signed)
  Problem: Self-Concept: Goal: Level of anxiety will decrease Outcome: Progressing   Problem: Education: Goal: Mental status will improve Outcome: Progressing   Problem: Activity: Goal: Sleeping patterns will improve Outcome: Progressing

## 2021-03-14 NOTE — [facility] Group Notes (Signed)
[facility] Group Notes:  (Nursing/MHT/Case Management/Adjunct)  Date:  03/14/2021  Time:  8:55 AM  Type of Therapy:  Psychoeducational Skills  Participation Level:  Minimal  Participation Quality:  Inattentive  Affect:  Blunted and Flat  Cognitive:  Lacking  Insight:  Lacking  Engagement in Group:  Limited  Modes of Intervention:  Discussion  Summary of Progress/Problems:discussed goals group and orientation group. [name] [name] forwarded little but stated her goal was to talk to the doctor. She said she was feeling better, but when writer asked to explore this topic she stated'' the miralax for my stomach''  [name] 03/14/2021, 8:55 AM

## 2021-03-14 NOTE — Progress Notes (Signed)
   03/14/21 1000  Psych Admission Type (Psych Patients Only)  Admission Status Involuntary  Psychosocial Assessment  Patient Complaints Suspiciousness  Eye Contact Brief  Facial Expression Anxious;Worried  Affect Preoccupied  Chartered loss adjuster Avoidant  Motor Activity Slow  Appearance/Hygiene Unremarkable  Behavior Characteristics Cooperative  Mood Suspicious  Thought Process  Coherency Concrete thinking  Content Paranoia  Delusions Paranoid  Perception Hallucinations  Hallucination Auditory  Judgment Limited  Confusion Mild  Danger to Self  Current suicidal ideation? Denies  Danger to Others  Danger to Others None reported or observed

## 2021-03-14 NOTE — Progress Notes (Signed)
Adult Psychoeducational Group Note  Date:  03/14/2021 Time:  11:39 PM  Group Topic/Focus:  Wrap-Up Group:   The focus of this group is to help patients review their daily goal of treatment and discuss progress on daily workbooks.  Participation Level:  Minimal  Participation Quality:  Drowsy  Affect:  Anxious and Irritable  Cognitive:  Disorganized  Insight: Limited  Engagement in Group:  Limited  Modes of Intervention:  Discussion  Additional Comments:  Pt stated her goal for today was to focus on her treatment plan. Pt stated she accomplished her goal today. Pt stated she talk with her doctor but did not get a chance to talk with her social worker about her care today.  Pt rated her overall day a 8 out of 10. Pt stated she made no calls today. Pt stated she felt better about herself today. Pt stated she was able to attend all meals. Pt stated she attend all groups held today. Pt stated she took all medications provided today. Pt stated her appetite was fair today. Pt rated sleep last night pretty good. Pt stated the goal tonight was to get some rest. Pt stated she had some physical pain tonight. PT stated she had had some mild stomach pain. PT rated her stomach pain a 2 on the pain level scale.  Pt denied visual hallucinations tonight and auditory issues tonight.  Pt denies thoughts of harming herself or others. Pt stated she would alert staff if anything changed.    Felipa Furnace 03/14/2021, 11:39 PM

## 2021-03-14 NOTE — Progress Notes (Signed)
Recreation Therapy Notes  Date: 5.9.22 Time: 1000 Location: [address] Dayroom   Group Topic: Coping Skills   Goal Area(s) Addresses: Patient will define what a coping skill is. Patient will work with peer to create a list of healthy coping skills beginning with each letter of the alphabet. Patient will successfully identify positive coping skills they can use post d/c.  Patient will acknowledge benefit(s) of using learned coping skills post d/c.   Behavioral Response:  Minimal, Attentive   Intervention: Group work   Activity: Coping A to Z. Patient asked to identify what a coping skill is and when they use them. Patients with Clinical research associate discussed healthy versus unhealthy coping skills. Next patients were given a blank worksheet titled "Coping Skills A-Z" and asked to pair up with a peer. Partners were instructed to come up with at least one positive coping skill per letter of the alphabet, addressing a specific challenge (ex: stress, anger, anxiety, depression, grief, doubt, isolation, self-harm/suicidal thoughts, substance use). Patients were given 15 minutes to brainstorm with their peer, before ideas were presented to the large group. Patients and LRT debriefed on the importance of coping skill selection based on situation and back-up plans when a skill tried is not effective. At the end of group, patients were given an handout of alphabetized strategies to keep for future reference.  Education: Pharmacologist, Scientist, physiological, Discharge Planning.    Education Outcome: Acknowledges education/Verbalizes understanding/In group clarification offered/Additional education needed   Clinical Observations/Feedback: Pt still appeared to be responding to internal stimuli but was much quieter.  Pt didn't participate in processing but was seen writing down the coping skills her group was able to come up with.    [name], LRT/CTRS    [name] A 03/14/2021 11:34 AM

## 2021-03-14 NOTE — Progress Notes (Incomplete)
[facility] MD Progress Note  03/14/2021 3:24 PM [name] [name]  MRN:  [id number]   Chief Complaint: psychosis  Subjective:  [name] [name] is a 45 y.o. female with a history of schizophrenia, who was initially admitted for inpatient psychiatric hospitalization on 03/09/2021 for management of paranoia and delusions. The patient is currently on Hospital Day 5.   Chart Review from last 24 hours:  The patient's chart was reviewed and nursing notes were reviewed. The patient's case was discussed in multidisciplinary team meeting. Per [facility]   Information Obtained Today During Patient Interview: The patient was seen and evaluated on the unit. On assessment today the patient reports ***  Principal Problem: <principal problem not specified> Diagnosis: Active Problems:   Schizophrenia (HCC)  Total Time Spent in Direct Patient Care:  I personally spent 30 minutes on the unit in direct patient care. The direct patient care time included face-to-face time with the patient, reviewing the patient's chart, communicating with other professionals, and coordinating care. Greater than 50% of this time was spent in counseling or coordinating care with the patient regarding goals of hospitalization, psycho-education, and discharge planning needs.  Past Psychiatric History: see admission H&P  Past Medical History:  Past Medical History:  Diagnosis Date  . Arthritis   . Depression   . Hallucinations   . Paranoid schizophrenia (HCC)   . Schizoaffective disorder (HCC)     Family History: see admission H&P  Family Psychiatric  History: see admission H&P  Social History:  Social History   Substance and Sexual Activity  Alcohol Use Not Currently   Comment: She denies      Social History   Substance and Sexual Activity  Drug Use Not Currently   Comment: She denies     Social History   Socioeconomic History  . Marital status: Single    Spouse name: Not on file  . Number of children: Not on file  . Years of  education: Not on file  . Highest education level: Not on file  Occupational History  . Occupation: Disability  Tobacco Use  . Smoking status: Current Every Day Smoker    Packs/day: 1.00    Types: Cigarettes  . Smokeless tobacco: Never Used  Vaping Use  . Vaping Use: Never used  Substance and Sexual Activity  . Alcohol use: Not Currently    Comment: She denies   . Drug use: Not Currently    Comment: She denies   . Sexual activity: Not Currently  Other Topics Concern  . Not on file  Social History Narrative   Pt stated that she lives in [place], and that she lives alone.  Pt receives outpatient psychiatric resources through [employer].   Social Determinants of Health   Financial Resource Strain: Not on file  Food Insecurity: Not on file  Transportation Needs: Not on file  Physical Activity: Not on file  Stress: Not on file  Social Connections: Not on file   Sleep: {BHH GOOD/FAIR/POOR:22877}  Appetite:  {BHH GOOD/FAIR/POOR:22877}  Current Medications: Current Facility-Administered Medications  Medication Dose Route Frequency Provider Last Rate Last Admin  . acetaminophen (TYLENOL) tablet 650 mg  650 mg Oral Q6H PRN Antonieta Pert, MD   650 mg at 03/12/21 0532  . alum & mag hydroxide-simeth (MAALOX/MYLANTA) 200-200-20 MG/5ML suspension 30 mL  30 mL Oral Q4H PRN Antonieta Pert, MD   30 mL at 03/12/21 1236  . benztropine (COGENTIN) tablet 1 mg  1 mg Oral BID PRN Antonieta Pert, MD      .  famotidine (PEPCID) tablet 20 mg  20 mg Oral BID Antonieta Pert, MD   20 mg at 03/14/21 0759  . feeding supplement (ENSURE ENLIVE / ENSURE PLUS) liquid 237 mL  237 mL Oral BID BM Antonieta Pert, MD   237 mL at 03/14/21 1519  . haloperidol (HALDOL) tablet 10 mg  10 mg Oral BID Antonieta Pert, MD   10 mg at 03/14/21 0800  . haloperidol (HALDOL) tablet 15 mg  15 mg Oral QHS Antonieta Pert, MD   15 mg at 03/13/21 2045  . OLANZapine zydis (ZYPREXA)  disintegrating tablet 10 mg  10 mg Oral Q8H PRN Antonieta Pert, MD   10 mg at 03/13/21 1639   And  . LORazepam (ATIVAN) tablet 1 mg  1 mg Oral Q6H PRN Antonieta Pert, MD       And  . ziprasidone (GEODON) injection 20 mg  20 mg Intramuscular Q6H PRN Antonieta Pert, MD      . magnesium hydroxide (MILK OF MAGNESIA) suspension 30 mL  30 mL Oral Daily PRN Antonieta Pert, MD      . ondansetron (ZOFRAN-ODT) disintegrating tablet 4 mg  4 mg Oral Q8H PRN Antonieta Pert, MD   4 mg at 03/12/21 1544  . polyethylene glycol (MIRALAX / GLYCOLAX) packet 17 g  17 g Oral Daily Antonieta Pert, MD   17 g at 03/14/21 0759  . risperiDONE (RISPERDAL M-TABS) disintegrating tablet 2 mg  2 mg Oral Daily Antonieta Pert, MD   2 mg at 03/14/21 0800  . risperiDONE (RISPERDAL M-TABS) disintegrating tablet 6 mg  6 mg Oral QHS Antonieta Pert, MD   6 mg at 03/13/21 2046  . senna-docusate (Senokot-S) tablet 1 tablet  1 tablet Oral QHS Antonieta Pert, MD   1 tablet at 03/13/21 2046  . traZODone (DESYREL) tablet 150 mg  150 mg Oral QHS Antonieta Pert, MD   150 mg at 03/13/21 2046    Lab Results:  Results for orders placed or performed during the hospital encounter of 03/09/21 (from the past 48 hour(s))  Urinalysis, Complete w Microscopic Urine, Unspecified Source     Status: Abnormal   Collection Time: 03/12/21  5:18 PM  Result Value Ref Range   Color, Urine YELLOW YELLOW   APPearance CLEAR CLEAR   Specific Gravity, Urine 1.010 1.005 - 1.030   pH 7.0 5.0 - 8.0   Glucose, UA NEGATIVE NEGATIVE mg/dL   Hgb urine dipstick NEGATIVE NEGATIVE   Bilirubin Urine NEGATIVE NEGATIVE   Ketones, ur NEGATIVE NEGATIVE mg/dL   Protein, ur NEGATIVE NEGATIVE mg/dL   Nitrite NEGATIVE NEGATIVE   Leukocytes,Ua NEGATIVE NEGATIVE   RBC / HPF 0-5 0 - 5 RBC/hpf   WBC, UA 0-5 0 - 5 WBC/hpf   Bacteria, UA RARE (A) NONE SEEN   Squamous Epithelial / LPF 0-5 0 - 5    Comment: Performed at [facility], [address] [place], [place] [address]    Blood Alcohol level:  Lab Results  Component Value Date   [facility] <10 05/19/2020   ETH <10 05/11/2020    Metabolic Disorder Labs: Lab Results  Component Value Date   HGBA1C 5.1 02/28/2021   MPG 99.67 02/28/2021   MPG 96.8 05/19/2020   Lab Results  Component Value Date   PROLACTIN 28.9 (H) 06/26/2018   Lab Results  Component Value Date   CHOL 138 02/28/2021   TRIG 69 02/28/2021  HDL 50 02/28/2021   CHOLHDL 2.8 02/28/2021   VLDL 14 02/28/2021   LDLCALC 74 02/28/2021   LDLCALC 121 (H) 05/19/2020    Physical Findings: AIMS: Facial and Oral Movements Muscles of Facial Expression: None, normal Lips and Perioral Area: None, normal Jaw: None, normal Tongue: None, normal,Extremity Movements Upper (arms, wrists, hands, fingers): None, normal Lower (legs, knees, ankles, toes): None, normal, Trunk Movements Neck, shoulders, hips: None, normal, Overall Severity Severity of abnormal movements (highest score from questions above): None, normal Incapacitation due to abnormal movements: None, normal Patient's awareness of abnormal movements (rate only patient's report): No Awareness, Dental Status Current problems with teeth and/or dentures?: No Does patient usually wear dentures?: No      Musculoskeletal: Strength & Muscle Tone: within normal limits Gait & Station: normal, steady Patient leans: N/A  Psychiatric Specialty Exam: Physical Exam  Review of Systems  Blood pressure 116/67, pulse 90, temperature 97.6 F (36.4 C), temperature source Oral, resp. rate 16, height 4\' 10"  (1.473 m), weight 42.2 kg, SpO2 99 %.Body mass index is 19.44 kg/m.  General Appearance: {Appearance:22683}  Eye Contact:  {BHH EYE CONTACT:22684}  Speech:  {Speech:22685}  Volume:  {Volume (PAA):22686}  Mood:  {BHH MOOD:22306}  Affect:  {Affect (PAA):22687}  Thought Process:  {Thought Process (PAA):22688}  Orientation:  {BHH ORIENTATION  (PAA):22689}  Thought Content:  {Thought Content:22690}  Suicidal Thoughts:  {ST/HT (PAA):22692}  Homicidal Thoughts:  {ST/HT (PAA):22692}  Memory:  {BHH MEMORY:22881}  Judgement:  {Judgement (PAA):22694}  Insight:  {Insight (PAA):22695}  Psychomotor Activity:  {Psychomotor (PAA):22696}  Concentration:  {Concentration:21399}  Recall:  {BHH GOOD/FAIR/POOR:22877}  Fund of Knowledge:  {BHH GOOD/FAIR/POOR:22877}  Language:  {BHH GOOD/FAIR/POOR:22877}  Akathisia:  {BHH YES OR NO:22294}  Handed:  {Handed:22697}  AIMS (if indicated):     Assets:  {Assets (PAA):22698}  ADL's:  {BHH ZOX'W:96045}ADL'S:22290}  Cognition:  {chl bhh cognition:304700322}  Sleep:  Number of Hours: 6.5   Treatment Plan Summary: Diagnoses / Active Problems: Schizophrenia by hx Mild anemia GERD Constipation  PLAN: 1. Safety and Monitoring:  -- Admission to inpatient psychiatric unit for safety, stabilization and treatment  -- Daily contact with patient to assess and evaluate symptoms and progress in treatment  -- Patient's case to be discussed in multi-disciplinary team meeting  -- Observation Level : q15 minute checks  -- Vital signs:  q12 hours  -- Precautions: suicide  2. Psychiatric Diagnoses and Treatment:   Schizophrenia by hx -- Continue Cogentin 1mg  bid PRN tremor/EPS -- Continue Haldol 10mg  bid and 15mg  qhs for psychosis -- Continue Risperdal 2mg  qam and 6mg  po qhs for psychosis -- Continue Zyprexa zydis agitation protocol PRN  -- Metabolic profile and EKG monitoring obtained while on an atypical antipsychotic (BMI:19.44; Lipid Panel: cholesterol 138, triglycerides 69, HDL 50 and LDL 74 on 02/28/21; HbgA1c:5.1 on 02/28/21; QTc:46266ms)   -- WBC 4.0 with ANC 1600 on 03/08/21 - repeating tomorrow for trending while on 2 antipsychotics  -- Encouraged patient to participate in unit milieu and in scheduled group therapies   -- Short Term Goals: Ability to identify changes in lifestyle to reduce recurrence of condition  will improve and Ability to identify and develop effective coping behaviors will improve  -- Long Term Goals: Improvement in symptoms so as ready for discharge   Justification for use of more than 1 antipsychotic:  Patient has tried and failed 3 or more antipsychotic  trials without sufficient improvement in symptoms or functioning. Previous antipsychotic trials include: Thorazine, Haldol, Haldol Decanoate, Zyprexa,  Invega Sustenna, Risperdal, Fanapt.   3. Medical Issues Being Addressed:   GERD  -- Continue Pepcid 20mg  bid   Constipation  -- Continue Miralax 17g/day  -- Continue Senna-docusate 1 tab qhs   Mild anemia  -- H/H 11.3/35.1 (was 10.8/34.7 10 months ago)  -- Will need f/u with PCP after discharge   4. Discharge Planning:   -- Social work and case management to assist with discharge planning and identification of hospital follow-up needs prior to discharge  -- Estimated LOS: 5-7 days  -- Discharge Concerns: Need to establish a safety plan; Medication compliance and effectiveness  -- Discharge Goals: Return home with outpatient referrals for mental health follow-up including medication management/psychotherapy  02-16-1990, MD, FAPA 03/14/2021, 3:24 PM

## 2021-03-14 NOTE — BHH Group Notes (Signed)
Type of Therapy and Topic: Group Therapy: Anger Management   Participation: Attended  Description of Group: In this group, patients will learn helpful strategies and techniques to manage anger, express anger in alternative ways, change hostile attitudes, and prevent aggressive acts, such as verbal abuse and violence.This group will be process-oriented and eductional, with patients participating in exploration of their own experiences as well as giving and receiving support and challenge from other group members.  Therapeutic Goals: 1. Patient will learn to manage anger. 2. Patient will learn to stop violence or the threat of violence. 3. Patient will learn to develop self control over thoughts and actions. 4. Patient will receive support and feedback from others  Therapeutic Modalities: Cognitive Behavioral Therapy Solution Focused Therapy Motivational Interviewing  Darrion Wyszynski, LCSWA Clinicial Social Worker [facility] 

## 2021-03-14 NOTE — Progress Notes (Signed)
   03/14/21 0500  Sleep  Number of Hours 6.5

## 2021-03-14 NOTE — Progress Notes (Signed)
[facility] MD Progress Note  03/14/2021 2:15 PM [name] [name]  MRN:  [id number] Subjective:  Patient is a 45 year old female with a past psychiatric history significant for paranoid schizophrenia who originally presented to the behavioral health urgent care center on 02/28/2021 with chief complaint that she was "having problems with God".   Objective: Patient is seen and examined.  Patient is a 45 year old female with the above-stated past psychiatric history who is seen in follow-up.  She is essentially unchanged from yesterday.  She continues with poor eye contact as well as low-volume speech.  She denies all symptoms.  Review of the nursing notes from last night revealed that the patient was seen responding to internal stimuli.  She did take her medications without incident.  She did sleep better last night.  She got about 6-1/2-hour sleep.  She stated her abdominal pain was better, and she has been having regular bowel movements.  Her vital signs are stable, she is afebrile.  No new laboratories.  Principal Problem: <principal problem not specified> Diagnosis: Active Problems:   Schizophrenia (HCC)  Total Time spent with patient: 20 minutes  Past Psychiatric History: See admission H&P  Past Medical History:  Past Medical History:  Diagnosis Date  . Arthritis   . Depression   . Hallucinations   . Paranoid schizophrenia (HCC)   . Schizoaffective disorder (HCC)    History reviewed. No pertinent surgical history. Family History: History reviewed. No pertinent family history. Family Psychiatric  History: See admission H&P Social History:  Social History   Substance and Sexual Activity  Alcohol Use Not Currently   Comment: She denies      Social History   Substance and Sexual Activity  Drug Use Not Currently   Comment: She denies     Social History   Socioeconomic History  . Marital status: Single    Spouse name: Not on file  . Number of children: Not on file  . Years of education:  Not on file  . Highest education level: Not on file  Occupational History  . Occupation: Disability  Tobacco Use  . Smoking status: Current Every Day Smoker    Packs/day: 1.00    Types: Cigarettes  . Smokeless tobacco: Never Used  Vaping Use  . Vaping Use: Never used  Substance and Sexual Activity  . Alcohol use: Not Currently    Comment: She denies   . Drug use: Not Currently    Comment: She denies   . Sexual activity: Not Currently  Other Topics Concern  . Not on file  Social History Narrative   Pt stated that she lives in [place], and that she lives alone.  Pt receives outpatient psychiatric resources through [employer].   Social Determinants of Health   Financial Resource Strain: Not on file  Food Insecurity: Not on file  Transportation Needs: Not on file  Physical Activity: Not on file  Stress: Not on file  Social Connections: Not on file   Additional Social History:                         Sleep: Good  Appetite:  Fair  Current Medications: Current Facility-Administered Medications  Medication Dose Route Frequency Provider Last Rate Last Admin  . acetaminophen (TYLENOL) tablet 650 mg  650 mg Oral Q6H PRN Antonieta Pert, MD   650 mg at 03/12/21 0532  . alum & mag hydroxide-simeth (MAALOX/MYLANTA) 200-200-20 MG/5ML suspension 30 mL  30 mL Oral Q4H  PRN Antonieta Pert, MD   30 mL at 03/12/21 1236  . benztropine (COGENTIN) tablet 1 mg  1 mg Oral BID PRN Antonieta Pert, MD      . famotidine (PEPCID) tablet 20 mg  20 mg Oral BID Antonieta Pert, MD   20 mg at 03/14/21 0759  . feeding supplement (ENSURE ENLIVE / ENSURE PLUS) liquid 237 mL  237 mL Oral BID BM Antonieta Pert, MD   237 mL at 03/14/21 1039  . haloperidol (HALDOL) tablet 10 mg  10 mg Oral BID Antonieta Pert, MD   10 mg at 03/14/21 0800  . haloperidol (HALDOL) tablet 15 mg  15 mg Oral QHS Antonieta Pert, MD   15 mg at 03/13/21 2045  . OLANZapine zydis (ZYPREXA)  disintegrating tablet 10 mg  10 mg Oral Q8H PRN Antonieta Pert, MD   10 mg at 03/13/21 1639   And  . LORazepam (ATIVAN) tablet 1 mg  1 mg Oral Q6H PRN Antonieta Pert, MD       And  . ziprasidone (GEODON) injection 20 mg  20 mg Intramuscular Q6H PRN Antonieta Pert, MD      . magnesium hydroxide (MILK OF MAGNESIA) suspension 30 mL  30 mL Oral Daily PRN Antonieta Pert, MD      . ondansetron (ZOFRAN-ODT) disintegrating tablet 4 mg  4 mg Oral Q8H PRN Antonieta Pert, MD   4 mg at 03/12/21 1544  . polyethylene glycol (MIRALAX / GLYCOLAX) packet 17 g  17 g Oral Daily Antonieta Pert, MD   17 g at 03/14/21 0759  . risperiDONE (RISPERDAL M-TABS) disintegrating tablet 2 mg  2 mg Oral Daily Antonieta Pert, MD   2 mg at 03/14/21 0800  . risperiDONE (RISPERDAL M-TABS) disintegrating tablet 6 mg  6 mg Oral QHS Antonieta Pert, MD   6 mg at 03/13/21 2046  . senna-docusate (Senokot-S) tablet 1 tablet  1 tablet Oral QHS Antonieta Pert, MD   1 tablet at 03/13/21 2046  . traZODone (DESYREL) tablet 150 mg  150 mg Oral QHS Antonieta Pert, MD   150 mg at 03/13/21 2046    Lab Results:  Results for orders placed or performed during the hospital encounter of 03/09/21 (from the past 48 hour(s))  Urinalysis, Complete w Microscopic Urine, Unspecified Source     Status: Abnormal   Collection Time: 03/12/21  5:18 PM  Result Value Ref Range   Color, Urine YELLOW YELLOW   APPearance CLEAR CLEAR   Specific Gravity, Urine 1.010 1.005 - 1.030   pH 7.0 5.0 - 8.0   Glucose, UA NEGATIVE NEGATIVE mg/dL   Hgb urine dipstick NEGATIVE NEGATIVE   Bilirubin Urine NEGATIVE NEGATIVE   Ketones, ur NEGATIVE NEGATIVE mg/dL   Protein, ur NEGATIVE NEGATIVE mg/dL   Nitrite NEGATIVE NEGATIVE   Leukocytes,Ua NEGATIVE NEGATIVE   RBC / HPF 0-5 0 - 5 RBC/hpf   WBC, UA 0-5 0 - 5 WBC/hpf   Bacteria, UA RARE (A) NONE SEEN   Squamous Epithelial / LPF 0-5 0 - 5    Comment: Performed at [facility], [address] [place], [place] [address]    Blood Alcohol level:  Lab Results  Component Value Date   [facility] <10 05/19/2020   ETH <10 05/11/2020    Metabolic Disorder Labs: Lab Results  Component Value Date   HGBA1C 5.1 02/28/2021   MPG 99.67 02/28/2021  MPG 96.8 05/19/2020   Lab Results  Component Value Date   PROLACTIN 28.9 (H) 06/26/2018   Lab Results  Component Value Date   CHOL 138 02/28/2021   TRIG 69 02/28/2021   HDL 50 02/28/2021   CHOLHDL 2.8 02/28/2021   VLDL 14 02/28/2021   LDLCALC 74 02/28/2021   LDLCALC 121 (H) 05/19/2020    Physical Findings: AIMS: Facial and Oral Movements Muscles of Facial Expression: None, normal Lips and Perioral Area: None, normal Jaw: None, normal Tongue: None, normal,Extremity Movements Upper (arms, wrists, hands, fingers): None, normal Lower (legs, knees, ankles, toes): None, normal, Trunk Movements Neck, shoulders, hips: None, normal, Overall Severity Severity of abnormal movements (highest score from questions above): None, normal Incapacitation due to abnormal movements: None, normal Patient's awareness of abnormal movements (rate only patient's report): No Awareness, Dental Status Current problems with teeth and/or dentures?: No Does patient usually wear dentures?: No  CIWA:    COWS:     Musculoskeletal: Strength & Muscle Tone: within normal limits Gait & Station: normal Patient leans: N/A  Psychiatric Specialty Exam:  Presentation  General Appearance: Appropriate for Environment  Eye Contact:Minimal  Speech:Normal Rate  Speech Volume:Decreased  Handedness:Right   Mood and Affect  Mood:Dysphoric  Affect:Flat   Thought Process  Thought Processes:Goal Directed  Descriptions of Associations:Loose  Orientation:Full (Time, Place and Person)  Thought Content:Delusions; Paranoid Ideation  History of Schizophrenia/Schizoaffective disorder:Yes  Duration of Psychotic Symptoms:Greater than  six months  Hallucinations:Hallucinations: Auditory  Ideas of Reference:Delusions; Paranoia  Suicidal Thoughts:Suicidal Thoughts: No  Homicidal Thoughts:Homicidal Thoughts: No   Sensorium  Memory:Immediate Fair; Recent Fair; Remote Fair  Judgment:Impaired  Insight:Lacking   Executive Functions  Concentration:Fair  Attention Span:Fair  Recall:Fair  Fund of Knowledge:Fair  Language:Fair   Psychomotor Activity  Psychomotor Activity:Psychomotor Activity: Normal   Assets  Assets:Desire for Improvement; Resilience   Sleep  Sleep:Sleep: Good Number of Hours of Sleep: 6.5    Physical Exam: Physical Exam Vitals and nursing note reviewed.  HENT:     Head: Normocephalic and atraumatic.  Pulmonary:     Effort: Pulmonary effort is normal.  Neurological:     General: No focal deficit present.     Mental Status: She is alert.    ROS Blood pressure 116/67, pulse 90, temperature 97.6 F (36.4 C), temperature source Oral, resp. rate 16, height 4\' 10"  (1.473 m), weight 42.2 kg, SpO2 99 %. Body mass index is 19.44 kg/m.   Treatment Plan Summary: Daily contact with patient to assess and evaluate symptoms and progress in treatment, Medication management and Plan : Patient is seen and examined.  Patient is a 45 year old female with the above-stated past psychiatric history who is seen in follow-up.   Diagnosis: 1.  Schizophrenia; undifferentiated. 2.  Tachycardia resolved. 3.  Constipation improved. 4.  Patient denies abdominal pain today.  Pertinent findings on examination today: 1.  Patient is essentially unchanged from yesterday, but I suspect we are near her baseline. 2.  No problems with sleep. 3.  Patient denies all symptoms.  Plan: 1.  Continue Cogentin 1 mg p.o. twice daily as needed tremors. 2.  Continue famotidine 20 mg p.o. twice daily for GERD. 3.  Continue haloperidol 10 mg p.o. twice daily and 15 mg p.o. nightly for psychosis and sleep. 4.   Continue Zyprexa Zydis agitation protocol as needed. 5.  Continue Zofran 4 mg p.o. every 8 hours as needed nausea and vomiting. 6.  Continue MiraLAX 17 g in 8 ounces of water p.o.  daily for constipation. 7.  Continue Risperdal 2 mg p.o. daily and 6 mg p.o. nightly for psychosis, mood stability and sleep. 8.  Continue Senokot S1 tablet p.o. nightly for constipation. 9.  Continue trazodone 150 mg p.o. nightly for insomnia. 10.  Disposition planning-patient continues to say that she has a condominium in her house in the [place] area, but we have been unable to confirm this.  I am unsure about her ability to care for herself independently.  This will have to be addressed prior to discharge.  Antonieta PertGreg Lawson Lonya Johannesen, MD 03/14/2021, 2:15 PM

## 2021-03-15 MED ORDER — DIPHENHYDRAMINE HCL 50 MG/ML IJ SOLN: 25.0000 mg | Freq: Four times a day (QID) | INTRAMUSCULAR | Status: DC | PRN

## 2021-03-15 NOTE — Progress Notes (Signed)
Pt awake in dayroom watching tv with peers and attended all scheduled groups without issues. Continues to respond to internal stimuli, observed mumbling and whispering to self throughout this shift "It's getting better". Denies SI, HI and VH when assessed. Remains medication compliant. Denies adverse drug reactions. Tolerates all PO intake well without discomfort.  Safety checks maintained without self harm gestures. Support and encouragement offered. All medications given as ordered. Pt remains safe on and off unit. Getting her needs met safely thus far.

## 2021-03-15 NOTE — Progress Notes (Signed)
Tom was up and visible on the unit.  She attended evening wrap up group with good participation.  She denied SI/HI.  She admits to auditory hallucinations and was talking to herself in her room before she fell asleep.  She denied any pain or discomfort and appeared to be in no physical distress.  She took her bedtime medications without difficulty.  Q 15 minute checks maintained for safety.  We will continue to monitor the progress towards her goals.     03/14/21 2107  Psych Admission Type (Psych Patients Only)  Admission Status Involuntary  Psychosocial Assessment  Patient Complaints Suspiciousness  Eye Contact Brief  Facial Expression Anxious;Worried  Affect Preoccupied  Museum/gallery curator;Slow  Interaction Avoidant;Cautious;Minimal;Isolative  Motor Activity Slow  Appearance/Hygiene Unremarkable  Behavior Characteristics Cooperative  Mood Suspicious  Thought Process  Coherency Concrete thinking  Content Paranoia  Delusions Paranoid  Perception Hallucinations  Hallucination Auditory  Judgment Limited  Confusion Mild  Danger to Self  Current suicidal ideation? Denies  Danger to Others  Danger to Others None reported or observed

## 2021-03-15 NOTE — Progress Notes (Signed)
[facility] MD Progress Note  03/15/2021 4:20 PM [name] [name]  MRN:  [id number]  Reason for admission:  Patient is a 45 year old female with a past psychiatric history significant for paranoid schizophrenia who originally presented to the behavioral health urgent care center on 02/28/2021 with chief complaint that she was "having problems with God".   Objective: Medical record reviewed.  Patient's case discussed in detail with members of the treatment team.  I met with and evaluated the patient today for follow-up on the unit.  The patient reports that she slept well.  She denies depressed mood or anhedonia.  She denies all symptoms including AH, VH, PI, SI, AI.  She does state that she felt like people were playing mind games on her prior to admission but no longer feels that way.  She reports feeling safe in the hospital.  She is eager for discharge and states that she lives on [facility] and psychiatric care with an ACT.  She states she last spoke with her ACT team just prior to admission.  Patient reports that she was having some stomach discomfort earlier today which was alleviated by MiraLAX and she was able to have a small bowel movement.  She denies other physical problems.  She denies any side effects to her medication.  She does report that previously when she took risperidone she disliked taking it because "I heard a squishing sound when I took it."  She denies experiencing this difficulty currently.  The patient slept 7.5 hours last night.  There are no new labs.  Vital signs this morning include BP of 120/78, pulse of 88, respirations of 16, O2 sat of 100% and temperature of 97.9.  The patient has been attending groups.  At times she participates and during other she has been observed to respond to internal stimuli throughout the group.  Nursing staff also report observing patient talking to herself in her room before she fell asleep last night.  The patient has been taking standing dose medications  as prescribed.  She has not taken any PRN medications.  Principal Problem: Schizophrenia ([place]) Diagnosis: Principal Problem:   Schizophrenia ([place])  Total Time spent with patient: 25 minutes  Past Psychiatric History: See admission H&P  Past Medical History:  Past Medical History:  Diagnosis Date  . Arthritis   . Depression   . Hallucinations   . Paranoid schizophrenia ([place])   . Schizoaffective disorder ([place])    History reviewed. No pertinent surgical history. Family History: History reviewed. No pertinent family history. Family Psychiatric  History: See admission H&P Social History:  Social History   Substance and Sexual Activity  Alcohol Use Not Currently   Comment: She denies      Social History   Substance and Sexual Activity  Drug Use Not Currently   Comment: She denies     Social History   Socioeconomic History  . Marital status: Single    Spouse name: Not on file  . Number of children: Not on file  . Years of education: Not on file  . Highest education level: Not on file  Occupational History  . Occupation: Disability  Tobacco Use  . Smoking status: Current Every Day Smoker    Packs/day: 1.00    Types: Cigarettes  . Smokeless tobacco: Never Used  Vaping Use  . Vaping Use: Never used  Substance and Sexual Activity  . Alcohol use: Not Currently    Comment: She denies   . Drug use: Not Currently  Comment: She denies   . Sexual activity: Not Currently  Other Topics Concern  . Not on file  Social History Narrative   Pt stated that she lives in [place], and that she lives alone.  Pt receives outpatient psychiatric resources through [facility] [employer].   Social Determinants of Health   Financial Resource Strain: Not on file  Food Insecurity: Not on file  Transportation Needs: Not on file  Physical Activity: Not on file  Stress: Not on file  Social Connections: Not on file   Additional Social History:                         Sleep:  Good  Appetite:  Fair  Current Medications: Current Facility-Administered Medications  Medication Dose Route Frequency Provider Last Rate Last Admin  . acetaminophen (TYLENOL) tablet 650 mg  650 mg Oral Q6H PRN [name], MD   650 mg at 03/12/21 0532  . alum & mag hydroxide-simeth (MAALOX/MYLANTA) 200-200-20 MG/5ML suspension 30 mL  30 mL Oral Q4H PRN [name], MD   30 mL at 03/12/21 1236  . benztropine (COGENTIN) tablet 1 mg  1 mg Oral BID PRN [name], MD      . diphenhydrAMINE (BENADRYL) injection 25 mg  25 mg Intramuscular Q6H PRN Arthor Captain, MD      . famotidine (PEPCID) tablet 20 mg  20 mg Oral BID [name], MD   20 mg at 03/15/21 0810  . feeding supplement (ENSURE ENLIVE / ENSURE PLUS) liquid 237 mL  237 mL Oral BID BM [name], MD   237 mL at 03/15/21 0929  . haloperidol (HALDOL) tablet 10 mg  10 mg Oral BID [name], MD   10 mg at 03/15/21 0810  . haloperidol (HALDOL) tablet 15 mg  15 mg Oral QHS [name], MD   15 mg at 03/14/21 2107  . OLANZapine zydis (ZYPREXA) disintegrating tablet 10 mg  10 mg Oral Q8H PRN [name], MD   10 mg at 03/13/21 1639   And  . LORazepam (ATIVAN) tablet 1 mg  1 mg Oral Q6H PRN [name], MD       And  . ziprasidone (GEODON) injection 20 mg  20 mg Intramuscular Q6H PRN [name], MD      . magnesium hydroxide (MILK OF MAGNESIA) suspension 30 mL  30 mL Oral Daily PRN [name], MD      . ondansetron (ZOFRAN-ODT) disintegrating tablet 4 mg  4 mg Oral Q8H PRN [name], MD   4 mg at 03/12/21 1544  . polyethylene glycol (MIRALAX / GLYCOLAX) packet 17 g  17 g Oral Daily [name], MD   17 g at 03/15/21 0810  . risperiDONE (RISPERDAL M-TABS) disintegrating tablet 2 mg  2 mg Oral Daily [name], MD   2 mg at 03/15/21 0810  . risperiDONE (RISPERDAL M-TABS) disintegrating tablet 6 mg  6 mg Oral QHS [name], MD   6 mg  at 03/14/21 2107  . senna-docusate (Senokot-S) tablet 1 tablet  1 tablet Oral QHS [name], MD   1 tablet at 03/14/21 2107  . traZODone (DESYREL) tablet 150 mg  150 mg Oral QHS [name], MD   150 mg at 03/14/21 2107    Lab Results:  No results found for this or any previous visit (from the past 35 hour(s)).  Blood Alcohol level:  Lab Results  Component Value Date   ETH <10 05/19/2020   ETH <10 29/79/8921    Metabolic Disorder Labs: Lab Results  Component Value Date   HGBA1C 5.1 02/28/2021   MPG 99.67 02/28/2021   MPG 96.8 05/19/2020   Lab Results  Component Value Date   PROLACTIN 28.9 (H) 06/26/2018   Lab Results  Component Value Date   CHOL 138 02/28/2021   TRIG 69 02/28/2021   HDL 50 02/28/2021   CHOLHDL 2.8 02/28/2021   VLDL 14 02/28/2021   LDLCALC 74 02/28/2021   LDLCALC 121 (H) 05/19/2020    Physical Findings: AIMS: Facial and Oral Movements Muscles of Facial Expression: None, normal Lips and Perioral Area: None, normal Jaw: None, normal Tongue: None, normal,Extremity Movements Upper (arms, wrists, hands, fingers): None, normal Lower (legs, knees, ankles, toes): None, normal, Trunk Movements Neck, shoulders, hips: None, normal, Overall Severity Severity of abnormal movements (highest score from questions above): None, normal Incapacitation due to abnormal movements: None, normal Patient's awareness of abnormal movements (rate only patient's report): No Awareness, Dental Status Current problems with teeth and/or dentures?: No Does patient usually wear dentures?: No  CIWA:    COWS:     Musculoskeletal: Strength & Muscle Tone: within normal limits Gait & Station: normal Patient leans: N/A  Psychiatric Specialty Exam:  Presentation  General Appearance: Appropriate for Environment  Eye Contact:Minimal  Speech:Normal Rate  Speech Volume:Decreased  Handedness:Right   Mood and Affect  Mood:Dysphoric  Affect:Flat   Thought  Process  Thought Processes:Goal Directed  Descriptions of Associations:Loose  Orientation:Full (Time, Place and Person)  Thought Content:Delusions  History of Schizophrenia/Schizoaffective disorder:Yes  Duration of Psychotic Symptoms:Greater than six months  Hallucinations:Hallucinations: Auditory  Ideas of Reference:Paranoia; Delusions  Suicidal Thoughts:Suicidal Thoughts: No  Homicidal Thoughts:Homicidal Thoughts: No   Sensorium  Memory:Immediate Fair; Recent Fair; Remote [place]   Executive Functions  Concentration:Fair  Attention Span:Fair  [place]  Language:Good   Psychomotor Activity  Psychomotor Activity:Psychomotor Activity: Normal   Assets  Assets:Desire for Improvement; Resilience   Sleep  Sleep:Sleep: Good Number of Hours of Sleep: 7.5    Physical Exam: Physical Exam Vitals and nursing note reviewed.  HENT:     Head: Normocephalic and atraumatic.  Pulmonary:     Effort: Pulmonary effort is normal.  Neurological:     General: No focal deficit present.     Mental Status: She is alert.    ROS Blood pressure 120/78, pulse 88, temperature 97.9 F (36.6 C), temperature source Oral, resp. rate 16, height _0  (1.473 m), weight 42.2 kg, SpO2 100 %. Body mass index is 19.44 kg/m.   Treatment Plan Summary: Daily contact with patient to assess and evaluate symptoms and progress in treatment, Medication management and Plan:  Diagnosis: 1.  Schizophrenia; undifferentiated. 2.  Tachycardia resolved. 3.  Constipation improved. 4.  Patient reported some abdominal pain this morning which was alleviated by MiraLAX.  Pertinent findings on examination today: 1.  Patient is essentially unchanged from yesterday.  She likely is at or near her baseline 2.  Patient is sleeping well 3.  Patient denies all symptoms.  Plan: 1.  Continue Cogentin 1 mg p.o. twice daily as needed  tremors. 2.  Continue famotidine 20 mg p.o. twice daily for GERD. 3.  Continue haloperidol 10 mg p.o. twice daily and 15 mg p.o. nightly for psychosis and sleep. 4.  Continue Zyprexa Zydis agitation protocol as needed. 5.  Continue Zofran 4 mg p.o. every 8 hours as needed nausea and vomiting. 6.  Continue MiraLAX 17 g in 8 ounces of water p.o. daily for constipation. 7.  Continue Risperdal 2 mg p.o. daily and 6 mg p.o. nightly for psychosis, mood stability and sleep. 8.  Continue Senokot S1 tablet p.o. nightly for constipation. 9.  Continue trazodone 150 mg p.o. nightly for insomnia. 10.  Disposition planning-patient continues to say that she has a condominium in her house in the [place] area, but we have been unable to confirm this. She reports that she is followed by [place] team.  Social work is investigating whether patient is currently connected with this ACT team.  Suspect patient would have difficulty caring for herself independently.  This concern needs to be addressed prior to discharge.  Arthor Captain, MD 03/15/2021, 4:20 PM

## 2021-03-15 NOTE — Progress Notes (Signed)
Recreation Therapy Notes  Date: 5.10.22 Time: 1000 Location: 500 Hall Dayroom  Group Topic: Communication  Goal Area(s) Addresses:  Patient will effectively communicate with peers in group.  Patient will verbalize benefit of healthy communication. Patient will verbalize positive effect of healthy communication on post d/c goals.  Patient will identify communication techniques that made activity effective for group.   Behavioral Response: None  Intervention: Paper, Pencils, Geometrical Shapes  Activity: Draw It How I Say It.  Three volunteers would get a picture.  Each person would describe their picture to the rest of the group.  The individuals describing there pictures were to be as detailed as possible.  The remaining group members could only ask them to repeat themselves.  When finished each presenter would let the group see the original picture and compare the pictures on the group members to the original.  Education: Communication, Discharge Planning  Education Outcome: Acknowledges understanding/In group clarification offered/Needs additional education.   Clinical Observations/Feedback: Pt was sitting by the window mumbling to herself the entire group session.  Pt was also staring off as if looking and speaking to someone.  Pt did not participate.     [name], LRT/CTRS     [name], Bryan Omura A 03/15/2021 1:00 PM

## 2021-03-15 NOTE — Progress Notes (Signed)
[name] [name] was in the day room much of the evening.  She did attend evening wrap up group.  Minimal interaction with staff or peers.  She was pleasant and cooperative.  She was seen occasionally mumbling to herself however she denied AVH.  She denied SI/HI.  She denied any pain or discomfort and appeared to be in no acute distress.  She did take her bedtime medications without difficulty. She is resting quietly in her room and appears to be asleep.  Q 15 minute checks maintained for safety.  She remains safe on the unit.   03/15/21 2058  Psych Admission Type (Psych Patients Only)  Admission Status Involuntary  Psychosocial Assessment  Patient Complaints Suspiciousness  Eye Contact Brief  Facial Expression Anxious;Worried  Affect Preoccupied  Museum/gallery curator;Slow  Interaction Avoidant;Cautious;Minimal;Isolative  Motor Activity Slow  Appearance/Hygiene Unremarkable  Behavior Characteristics Cooperative  Mood Preoccupied;Suspicious  Thought Process  Coherency Concrete thinking  Content Preoccupation;Paranoia  Delusions Paranoid  Perception Hallucinations  Hallucination Auditory  Judgment Limited  Confusion Mild  Danger to Self  Current suicidal ideation? Denies  Danger to Others  Danger to Others None reported or observed

## 2021-03-15 NOTE — Progress Notes (Signed)
Adult Psychoeducational Group Note  Date:  03/15/2021 Time:  11:08 PM  Group Topic/Focus:  Wrap-Up Group:   The focus of this group is to help patients review their daily goal of treatment and discuss progress on daily workbooks.  Participation Level:  Minimal  Participation Quality:  Attentive  Affect:  Flat  Cognitive:  Appropriate  Insight: Good  Engagement in Group:  Supportive  Modes of Intervention:  Discussion  Additional Comments:  Pt stated her goal for today was to focus on her treatment plan and continue her medication regimen. Pt said she accomplished her goals today. Pt said she did not converse with any staff. Pt rated her overall day a 7. Pt stated that she had not contacted any family members or friends today. Pt said she had a good day. Pt indicated her relationship with her family and support system was good. Pt said she was able to attend all meal services. Pt said she took all medications provided today. Pt said her appetite was pretty good today. Pt rated sleep last night as pretty good. Pt said she was not having physical pain today. Pt denies auditory or visual hallucinations. Pt denies thoughts of harming herself or others. Pt said she would alert staff if anything changed. End of progress report.      Nicoletta Dress 03/15/2021, 11:08 PM

## 2021-03-15 NOTE — [facility] Group Notes (Signed)
[facility] Group Notes:  (Nursing/MHT/Case Management/Adjunct)  Date:  03/15/2021  Time:  9:39 AM  Type of Therapy:  Group Therapy  Participation Level:  Active  Participation Quality:  Appropriate  Affect:  Appropriate  Cognitive:  Appropriate  Insight:  Appropriate  Engagement in Group:  Engaged  Modes of Intervention:  Orientation  Summary of Progress/Problems: Her goal for today is to go home and take care of her errands.   [name] [name] J [name] [name] 03/15/2021, 9:39 AM

## 2021-03-16 NOTE — [facility] Group Notes (Signed)
Adult Psychoeducational Group Note  Date:  03/16/2021 Time:  9:24 PM  Group Topic/Focus:  Wrap-Up Group:   The focus of this group is to help patients review their daily goal of treatment and discuss progress on daily workbooks.  Participation Level:  None  Participation Quality:  Appropriate  Affect:  Appropriate and Lethargic  Cognitive:  Appropriate  Insight: Lacking  Engagement in Group:  None  Modes of Intervention:  Discussion  Additional Comments:   Jacalyn Lefevre 03/16/2021, 9:24 PM

## 2021-03-16 NOTE — Progress Notes (Signed)
Progress note    03/16/21 0758  Psych Admission Type (Psych Patients Only)  Admission Status Involuntary  Psychosocial Assessment  Patient Complaints Anxiety  Eye Contact Fair  Facial Expression Anxious;Worried  Affect Anxious;Preoccupied  Systems analyst;Soft  Interaction Cautious;Forwards little;Guarded  Motor Activity Slow  Appearance/Hygiene Unremarkable  Behavior Characteristics Cooperative;Appropriate to situation;Anxious  Mood Anxious;Suspicious;Preoccupied;Pleasant  Thought Administrator, sports thinking  Content Preoccupation;Paranoia  Delusions Paranoid  Perception Hallucinations  Hallucination Auditory  Judgment Limited  Confusion None  Danger to Self  Current suicidal ideation? Denies  Danger to Others  Danger to Others None reported or observed

## 2021-03-16 NOTE — Plan of Care (Signed)
  Problem: Education: Goal: Knowledge of [facility] Education information/materials will improve Outcome: Progressing Goal: Emotional status will improve Outcome: Progressing Goal: Verbalization of understanding the information provided will improve Outcome: Progressing   

## 2021-03-16 NOTE — [facility] Group Notes (Signed)
LCSW Group Therapy Note  Type of Therapy/Topic: Group Therapy: Six Dimensions of Wellness  Participation Level: Did Not Attend  Description of Group: This group will address the concept of wellness and the six concepts of wellness: occupational, physical, social, intellectual, spiritual, and emotional. Patients will be encouraged to process areas in their lives that are out of balance and identify reasons for remaining unbalanced. Patients will be encouraged to explore ways to practice healthy habits daily to attain better physical and mental health outcomes.  Therapeutic Goals: 1. Identify aspects of wellness that they are doing well. 2. Identify aspects of wellness that they would like to improve upon. 3. Identify one action they can take to improve an aspect of wellness in their lives.   Summary of Patient Progress: Did not attend.     Therapeutic Modalities: Cognitive Behavioral Therapy Solution-Focused Therapy Relapse Prevention  

## 2021-03-16 NOTE — Progress Notes (Signed)
[name] [name] was in the day room much of the evening.  She did attend evening wrap up group.  She was noted sitting alone, watching TV and mumbling to herself.  She denied SI/HI or AVH but is clearly responding.  She took her bedtime medications without difficulty.  She requested to have a list of her medications and a list was provided.  She is currently resting with her eyes closed and appears to be asleep.  Q 15 minute checks maintained for safety.  We will continue to monitor the progress towards her goals.   03/16/21 2116  Psych Admission Type (Psych Patients Only)  Admission Status Involuntary  Psychosocial Assessment  Patient Complaints None  Eye Contact Brief  Facial Expression Worried  Affect Anxious;Preoccupied  Speech Logical/coherent;Soft  Interaction Cautious;Forwards little;Guarded  Motor Activity Other (Comment) (wdl)  Appearance/Hygiene Unremarkable  Behavior Characteristics Cooperative;Appropriate to situation  Mood Anxious;Pleasant;Suspicious  Thought Process  Coherency Concrete thinking  Content Preoccupation;Paranoia  Delusions Paranoid  Perception Hallucinations  Hallucination Auditory  Judgment Limited  Confusion None  Danger to Self  Current suicidal ideation? Denies  Danger to Others  Danger to Others None reported or observed

## 2021-03-16 NOTE — [facility] Counselor (Signed)
This patient remains on the [facility] waitlist.  ° ° ° °Ashwini Jago MSW, LCSW °Clincal Social Worker  °[facility]  °

## 2021-03-16 NOTE — [facility] Counselor (Signed)
CSW spoke with pt around her living environment and the need for extra supports. Pt declines further assistance and declined group home/ALF placement.      Ruthann Cancer MSW, LCSW Clincal Social Worker  [facility]

## 2021-03-16 NOTE — Progress Notes (Signed)
[facility] MD Progress Note  03/16/2021 6:00 PM [name] [name]  MRN:  [id number]  Subjective: I came here because I needed help. I was feeling like someone was after me. I'm feeling a lot better today. My anxiety is low, no depression".   Reason for admission:  Patient is a 45 year old female with a past psychiatric history significant for paranoid schizophrenia who originally presented to the behavioral health urgent care center on 02/28/2021 with chief complaint that she was "having problems with God".   (Per previous notes): Objective: Medical record reviewed.  Patient's case discussed in detail with members of the treatment team.  I met with and evaluated the patient today for follow-up on the unit.  The patient reports that she slept well last night.  She denies depressed mood or anhedonia. She denies anxiety symptoms. She denies all symptoms including AH, VH, PI, SI, AI.  She does state that she felt like people were playing mind games on her prior to admission but no longer feels that way.  She reports feeling safe in the hospital.  She is eager for discharge and states that she lives on [facility] and psychiatric care with an ACT.  She states she last spoke with her ACT team just prior to admission.  Patient reports that she was having some stomach discomfort earlier today which was alleviated by MiraLAX and she was able to have a small bowel movement.  She denies other physical problems.  She denies any side effects to her medication.  She does report that previously when she took risperidone she disliked taking it because "I heard a squishing sound when I took it."  She denies experiencing this difficulty currently.  The patient slept 7.5 hours last night.  There are no new labs.  Vital signs this morning include BP of 120/78, pulse of 88, respirations of 16, O2 sat of 100% and temperature of 97.9.  The patient has been attending groups.  At times she participates and during other she has been observed  to respond to internal stimuli throughout the group.  Nursing staff also report observing patient talking to herself in her room before she fell asleep last night.  The patient has been taking standing dose medications as prescribed.  She has not taken any PRN medications.  Principal Problem: Schizophrenia ([place])  Diagnosis: Principal Problem:   Schizophrenia ([place])  Total Time spent with patient: 15 minutes  Past Psychiatric History: See admission H&P  Past Medical History:  Past Medical History:  Diagnosis Date  . Arthritis   . Depression   . Hallucinations   . Paranoid schizophrenia ([place])   . Schizoaffective disorder ([place])    History reviewed. No pertinent surgical history.  Family History: History reviewed. No pertinent family history.  Family Psychiatric  History: See admission H&P  Social History:  Social History   Substance and Sexual Activity  Alcohol Use Not Currently   Comment: She denies      Social History   Substance and Sexual Activity  Drug Use Not Currently   Comment: She denies     Social History   Socioeconomic History  . Marital status: Single    Spouse name: Not on file  . Number of children: Not on file  . Years of education: Not on file  . Highest education level: Not on file  Occupational History  . Occupation: Disability  Tobacco Use  . Smoking status: Current Every Day Smoker    Packs/day: 1.00    Types:  Cigarettes  . Smokeless tobacco: Never Used  Vaping Use  . Vaping Use: Never used  Substance and Sexual Activity  . Alcohol use: Not Currently    Comment: She denies   . Drug use: Not Currently    Comment: She denies   . Sexual activity: Not Currently  Other Topics Concern  . Not on file  Social History Narrative   Pt stated that she lives in [place], and that she lives alone.  Pt receives outpatient psychiatric resources through [facility].   Social Determinants of Health   Financial Resource Strain: Not on file   Food Insecurity: Not on file  Transportation Needs: Not on file  Physical Activity: Not on file  Stress: Not on file  Social Connections: Not on file   Additional Social History:   Sleep: Good  Appetite:  Fair  Current Medications: Current Facility-Administered Medications  Medication Dose Route Frequency Provider Last Rate Last Admin  . acetaminophen (TYLENOL) tablet 650 mg  650 mg Oral Q6H PRN [name], MD   650 mg at 03/12/21 0532  . alum & mag hydroxide-simeth (MAALOX/MYLANTA) 200-200-20 MG/5ML suspension 30 mL  30 mL Oral Q4H PRN [name], MD   30 mL at 03/12/21 1236  . benztropine (COGENTIN) tablet 1 mg  1 mg Oral BID PRN [name], MD      . diphenhydrAMINE (BENADRYL) injection 25 mg  25 mg Intramuscular Q6H PRN Arthor Captain, MD      . famotidine (PEPCID) tablet 20 mg  20 mg Oral BID [name], MD   20 mg at 03/16/21 1641  . feeding supplement (ENSURE ENLIVE / ENSURE PLUS) liquid 237 mL  237 mL Oral BID BM [name], MD   237 mL at 03/16/21 1447  . haloperidol (HALDOL) tablet 10 mg  10 mg Oral BID [name], MD   10 mg at 03/16/21 1641  . haloperidol (HALDOL) tablet 15 mg  15 mg Oral QHS [name], MD   15 mg at 03/15/21 2058  . magnesium hydroxide (MILK OF MAGNESIA) suspension 30 mL  30 mL Oral Daily PRN [name], MD      . OLANZapine zydis (ZYPREXA) disintegrating tablet 10 mg  10 mg Oral Q8H PRN [name], MD   10 mg at 03/13/21 1639  . ondansetron (ZOFRAN-ODT) disintegrating tablet 4 mg  4 mg Oral Q8H PRN [name], MD   4 mg at 03/12/21 1544  . polyethylene glycol (MIRALAX / GLYCOLAX) packet 17 g  17 g Oral Daily [name], MD   17 g at 03/15/21 0810  . risperiDONE (RISPERDAL M-TABS) disintegrating tablet 2 mg  2 mg Oral Daily [name], MD   2 mg at 03/16/21 0758  . risperiDONE (RISPERDAL M-TABS) disintegrating tablet 6 mg  6 mg Oral QHS [name], MD    6 mg at 03/15/21 2057  . senna-docusate (Senokot-S) tablet 1 tablet  1 tablet Oral QHS [name], MD   1 tablet at 03/15/21 2058  . traZODone (DESYREL) tablet 150 mg  150 mg Oral QHS [name], MD   150 mg at 03/15/21 2058   Lab Results:  No results found for this or any previous visit (from the past 18 hour(s)).  Blood Alcohol level:  Lab Results  Component Value Date   [facility] <10 05/19/2020   ETH <10 83/66/2947   Metabolic Disorder Labs: Lab Results  Component Value Date   HGBA1C 5.1 02/28/2021   MPG 99.67 02/28/2021   MPG 96.8 05/19/2020   Lab Results  Component Value Date   PROLACTIN 28.9 (H) 06/26/2018   Lab Results  Component Value Date   CHOL 138 02/28/2021   TRIG 69 02/28/2021   HDL 50 02/28/2021   CHOLHDL 2.8 02/28/2021   VLDL 14 02/28/2021   LDLCALC 74 02/28/2021   LDLCALC 121 (H) 05/19/2020   Physical Findings: AIMS: Facial and Oral Movements Muscles of Facial Expression: None, normal Lips and Perioral Area: None, normal Jaw: None, normal Tongue: None, normal,Extremity Movements Upper (arms, wrists, hands, fingers): None, normal Lower (legs, knees, ankles, toes): None, normal, Trunk Movements Neck, shoulders, hips: None, normal, Overall Severity Severity of abnormal movements (highest score from questions above): None, normal Incapacitation due to abnormal movements: None, normal Patient's awareness of abnormal movements (rate only patient's report): No Awareness, Dental Status Current problems with teeth and/or dentures?: No Does patient usually wear dentures?: No  CIWA:    COWS:     Musculoskeletal: Strength & Muscle Tone: within normal limits Gait & Station: normal Patient leans: N/A  Psychiatric Specialty Exam:  Presentation  General Appearance: Appropriate for Environment  Eye Contact:Minimal  Speech:Normal Rate  Speech Volume:Decreased  Handedness:Right  Mood and Affect  Mood:Dysphoric  Affect:Flat  Thought  Process  Thought Processes:Goal Directed  Descriptions of Associations:Loose  Orientation:Full (Time, Place and Person)  Thought Content:Delusions  History of Schizophrenia/Schizoaffective disorder:Yes  Duration of Psychotic Symptoms:Greater than six months  Hallucinations:Hallucinations: Auditory  Ideas of Reference:Paranoia; Delusions  Suicidal Thoughts:Suicidal Thoughts: No  Homicidal Thoughts:Homicidal Thoughts: No  Sensorium  Memory:Immediate Fair; Recent Fair; Remote [place]  Executive Functions  Concentration:Fair  Attention Span:Fair  [place]  Language:Good  Psychomotor Activity  Psychomotor Activity:Psychomotor Activity: Normal  Assets  Assets:Desire for Improvement; Resilience  Sleep  Sleep:Sleep: Good Number of Hours of Sleep: 7.5  Physical Exam: Physical Exam Vitals and nursing note reviewed.  HENT:     Head: Normocephalic and atraumatic.     Nose: Nose normal.     Mouth/Throat:     Pharynx: Oropharynx is clear.  Eyes:     Pupils: Pupils are equal, round, and reactive to light.  Cardiovascular:     Rate and Rhythm: Normal rate.     Pulses: Normal pulses.  Pulmonary:     Effort: Pulmonary effort is normal.  Genitourinary:    Comments: Deferred Musculoskeletal:     Cervical back: Normal range of motion.  Skin:    General: Skin is warm.  Neurological:     General: No focal deficit present.     Mental Status: She is alert.    Review of Systems  Constitutional: Negative.   HENT: Negative.   Eyes: Negative.   Respiratory: Negative.   Cardiovascular: Negative.   Gastrointestinal: Negative.   Genitourinary: Negative.   Musculoskeletal: Negative.   Skin: Negative.   Neurological: Negative for dizziness, tingling, tremors, sensory change, speech change, focal weakness, seizures, loss of consciousness, weakness and headaches.  Endo/Heme/Allergies:       Allergies: NKDA   Psychiatric/Behavioral: Positive for hallucinations. Negative for suicidal ideas.   Blood pressure 124/79, pulse (!) 104, temperature 97.6 F (36.4 C), temperature source Oral, resp. rate 16, height 4' 10"  (1.473 m), weight 42.2 kg, SpO2 100 %. Body mass index is 19.44 kg/m.  Treatment Plan Summary: Daily contact with patient to assess and evaluate symptoms and progress in treatment,  Medication management and Plan:  Diagnosis: 1.  Schizophrenia; undifferentiated. 2.  Tachycardia resolved. 3.  Constipation improved. 4.  Patient reported some abdominal pain this morning which was alleviated by MiraLAX.  Pertinent findings on examination today: 1.  Patient is essentially unchanged from yesterday.  She likely is at or near her baseline 2.  Patient is sleeping well 3.  Patient denies all symptoms.  Plan: 1.  Continue Cogentin 1 mg p.o. twice daily as needed tremors. 2.  Continue famotidine 20 mg p.o. twice daily for GERD. 3.  Continue haloperidol 10 mg p.o. twice daily and 15 mg p.o. nightly for psychosis and sleep. 4.  Continue Zyprexa Zydis agitation protocol as needed. 5.  Continue Zofran 4 mg p.o. every 8 hours as needed nausea and vomiting. 6.  Continue MiraLAX 17 g in 8 ounces of water p.o. daily for constipation. 7.  Continue Risperdal 2 mg p.o. daily and 6 mg p.o. nightly for psychosis, mood stability and sleep. 8.  Continue Senokot S1 tablet p.o. nightly for constipation. 9.  Continue trazodone 150 mg p.o. nightly for insomnia. 10.  Disposition planning-patient continues to say that she has a condominium in her house in the [place] area, but we have been unable to confirm this. She reports that she is followed by [place] team.  Social work is investigating whether patient is currently connected with this ACT team.  Suspect patient would have difficulty caring for herself independently.  This concern needs to be addressed prior to discharge.  Lindell Spar, NP,  pmhnp, fnp-bc 03/16/2021, 6:00 PMPatient ID: [name] [name], female   DOB: 1976-10-04, 45 y.o.   MRN: [id number]

## 2021-03-16 NOTE — Progress Notes (Signed)
Recreation Therapy Notes  Date: 5.11.22 Time: 1000 Location: 500 Hall Dayroom  Group Topic: Anger Management  Goal Area(s) Addresses:  Patient will identify what leads to their anger. Patient will identify what they do when angry.  Patient will identify any problems they have run into because of anger.  Behavioral Response: Minimal  Intervention: Conversation with group, and worksheets  Activity: Patient discussed anger, what makes them angry, and what other emotions come with anger. Patients identified what anger is, what correlates with anger, what triggers anger, and the way they specifically react when angry.  Education: Anger Management, Discharge Planning   Education Outcome: Acknowledges education/In group clarification offered/Needs additional education.   Clinical Observations/Feedback: Pt did not participate in group discussion but filled out the worksheet.  Pt wrote about getting angry because of lies, being ignored and phony associates.  Pt also wrote when angry, pt gets depressed, annoyed and refrains from associations.  Pt runs into problems like arguing, crying/sadness and refraining from associates.     [name] [name], [name] [name]    [name] [name] A 03/16/2021 11:27 AM

## 2021-03-16 NOTE — Tx Team (Signed)
Interdisciplinary Treatment and Diagnostic Plan Update  03/16/2021 Time of Session: 10am [name] [name] MRN: [id number]  Principal Diagnosis: Schizophrenia [facility])  Secondary Diagnoses: Principal Problem:   Schizophrenia (HCC)   Current Medications:  Current Facility-Administered Medications  Medication Dose Route Frequency Provider Last Rate Last Admin  . acetaminophen (TYLENOL) tablet 650 mg  650 mg Oral Q6H PRN Antonieta Pert, MD   650 mg at 03/12/21 0532  . alum & mag hydroxide-simeth (MAALOX/MYLANTA) 200-200-20 MG/5ML suspension 30 mL  30 mL Oral Q4H PRN Antonieta Pert, MD   30 mL at 03/12/21 1236  . benztropine (COGENTIN) tablet 1 mg  1 mg Oral BID PRN Antonieta Pert, MD      . diphenhydrAMINE (BENADRYL) injection 25 mg  25 mg Intramuscular Q6H PRN Claudie Revering, MD      . famotidine (PEPCID) tablet 20 mg  20 mg Oral BID Antonieta Pert, MD   20 mg at 03/16/21 0758  . feeding supplement (ENSURE ENLIVE / ENSURE PLUS) liquid 237 mL  237 mL Oral BID BM Antonieta Pert, MD   237 mL at 03/16/21 1045  . haloperidol (HALDOL) tablet 10 mg  10 mg Oral BID Antonieta Pert, MD   10 mg at 03/16/21 0758  . haloperidol (HALDOL) tablet 15 mg  15 mg Oral QHS Antonieta Pert, MD   15 mg at 03/15/21 2058  . OLANZapine zydis (ZYPREXA) disintegrating tablet 10 mg  10 mg Oral Q8H PRN Antonieta Pert, MD   10 mg at 03/13/21 1639   And  . LORazepam (ATIVAN) tablet 1 mg  1 mg Oral Q6H PRN Antonieta Pert, MD       And  . ziprasidone (GEODON) injection 20 mg  20 mg Intramuscular Q6H PRN Antonieta Pert, MD      . magnesium hydroxide (MILK OF MAGNESIA) suspension 30 mL  30 mL Oral Daily PRN Antonieta Pert, MD      . ondansetron (ZOFRAN-ODT) disintegrating tablet 4 mg  4 mg Oral Q8H PRN Antonieta Pert, MD   4 mg at 03/12/21 1544  . polyethylene glycol (MIRALAX / GLYCOLAX) packet 17 g  17 g Oral Daily Antonieta Pert, MD   17 g at 03/15/21 0810  . risperiDONE  (RISPERDAL M-TABS) disintegrating tablet 2 mg  2 mg Oral Daily Antonieta Pert, MD   2 mg at 03/16/21 0758  . risperiDONE (RISPERDAL M-TABS) disintegrating tablet 6 mg  6 mg Oral QHS Antonieta Pert, MD   6 mg at 03/15/21 2057  . senna-docusate (Senokot-S) tablet 1 tablet  1 tablet Oral QHS Antonieta Pert, MD   1 tablet at 03/15/21 2058  . traZODone (DESYREL) tablet 150 mg  150 mg Oral QHS Antonieta Pert, MD   150 mg at 03/15/21 2058   PTA Medications: Medications Prior to Admission  Medication Sig Dispense Refill Last Dose  . benztropine (COGENTIN) 1 MG tablet Take 1 tablet (1 mg total) by mouth 2 (two) times daily. 60 tablet 0   . chlorproMAZINE (THORAZINE) 50 MG tablet Take 1 tablet by mouth in the morning and at bedtime.     . famotidine (PEPCID) 20 MG tablet Take 20 mg by mouth 2 (two) times daily as needed.     . haloperidol (HALDOL) 5 MG tablet Take 1 tablet (5 mg total) by mouth 2 (two) times daily.     . haloperidol decanoate (HALDOL DECANOATE) 100 MG/ML injection Inject 100  mg into the muscle every 28 (twenty-eight) days.     . vitamin C (ASCORBIC ACID) 500 MG tablet Take 500 mg by mouth daily.     . Vitamin D, Cholecalciferol, 25 MCG (1000 UT) TABS Take 1 tablet by mouth daily.       Patient Stressors: Health problems  Patient Strengths: Motivation for treatment/growth Supportive family/friends  Treatment Modalities: Medication Management, Group therapy, Case management,  1 to 1 session with clinician, Psychoeducation, Recreational therapy.   Physician Treatment Plan for Primary Diagnosis: Schizophrenia (HCC) Long Term Goal(s): Improvement in symptoms so as ready for discharge Improvement in symptoms so as ready for discharge   Short Term Goals: Ability to identify changes in lifestyle to reduce recurrence of condition will improve Ability to verbalize feelings will improve Ability to disclose and discuss suicidal ideas Ability to demonstrate self-control  will improve Ability to identify and develop effective coping behaviors will improve Ability to maintain clinical measurements within normal limits will improve Compliance with prescribed medications will improve Ability to identify changes in lifestyle to reduce recurrence of condition will improve Ability to verbalize feelings will improve Ability to disclose and discuss suicidal ideas Ability to demonstrate self-control will improve Ability to identify and develop effective coping behaviors will improve Ability to maintain clinical measurements within normal limits will improve Compliance with prescribed medications will improve  Medication Management: Evaluate patient's response, side effects, and tolerance of medication regimen.  Therapeutic Interventions: 1 to 1 sessions, Unit Group sessions and Medication administration.  Evaluation of Outcomes: Progressing  Physician Treatment Plan for Secondary Diagnosis: Principal Problem:   Schizophrenia (HCC)  Long Term Goal(s): Improvement in symptoms so as ready for discharge Improvement in symptoms so as ready for discharge   Short Term Goals: Ability to identify changes in lifestyle to reduce recurrence of condition will improve Ability to verbalize feelings will improve Ability to disclose and discuss suicidal ideas Ability to demonstrate self-control will improve Ability to identify and develop effective coping behaviors will improve Ability to maintain clinical measurements within normal limits will improve Compliance with prescribed medications will improve Ability to identify changes in lifestyle to reduce recurrence of condition will improve Ability to verbalize feelings will improve Ability to disclose and discuss suicidal ideas Ability to demonstrate self-control will improve Ability to identify and develop effective coping behaviors will improve Ability to maintain clinical measurements within normal limits will  improve Compliance with prescribed medications will improve     Medication Management: Evaluate patient's response, side effects, and tolerance of medication regimen.  Therapeutic Interventions: 1 to 1 sessions, Unit Group sessions and Medication administration.  Evaluation of Outcomes: Progressing   RN Treatment Plan for Primary Diagnosis: Schizophrenia (HCC) Long Term Goal(s): Knowledge of disease and therapeutic regimen to maintain health will improve  Short Term Goals: Ability to demonstrate self-control, Ability to participate in decision making will improve and Ability to verbalize feelings will improve  Medication Management: RN will administer medications as ordered by provider, will assess and evaluate patient's response and provide education to patient for prescribed medication. RN will report any adverse and/or side effects to prescribing provider.  Therapeutic Interventions: 1 on 1 counseling sessions, Psychoeducation, Medication administration, Evaluate responses to treatment, Monitor vital signs and CBGs as ordered, Perform/monitor CIWA, COWS, AIMS and Fall Risk screenings as ordered, Perform wound care treatments as ordered.  Evaluation of Outcomes: Progressing   LCSW Treatment Plan for Primary Diagnosis: Schizophrenia (HCC) Long Term Goal(s): Safe transition to appropriate next level of care  at discharge, Engage patient in therapeutic group addressing interpersonal concerns.  Short Term Goals: Engage patient in aftercare planning with referrals and resources, Increase social support and Increase ability to appropriately verbalize feelings  Therapeutic Interventions: Assess for all discharge needs, 1 to 1 time with Social worker, Explore available resources and support systems, Assess for adequacy in community support network, Educate family and significant other(s) on suicide prevention, Complete Psychosocial Assessment, Interpersonal group therapy.  Evaluation of  Outcomes: Progressing   Progress in Treatment: Attending groups: Yes. Participating in groups: Yes. Taking medication as prescribed: Yes. Toleration medication: Yes. Family/Significant other contact made: Yes, individual(s) contacted:  pt declined consents Patient understands diagnosis: Yes. Discussing patient identified problems/goals with staff: Yes. Medical problems stabilized or resolved: Yes. Denies suicidal/homicidal ideation: Yes. Issues/concerns per patient self-inventory: No. Other: None  New problem(s) identified: No, Describe:  None  New Short Term/Long Term Goal(s):medication stabilization, elimination of SI thoughts, development of comprehensive mental wellness plan.  Patient Goals:  "take my medications."  Discharge Plan or Barriers: Patient is to follow up with [facility] team and is to return to stay in her apartment   Reason for Continuation of Hospitalization: Delusions  Medication stabilization  Estimated Length of Stay: 3-5 days  Attendees: Patient:  03/11/2021   Physician:  03/11/2021   Nursing:  03/11/2021   RN Care Manager: 03/11/2021   Social Worker: Ruthann Cancer, LCSW 03/11/2021   Recreational Therapist:  03/11/2021   Other:  03/11/2021   Other:  03/11/2021   Other: 03/11/2021       Scribe for Treatment Team: Otelia Santee, LCSW 03/16/2021 2:31 PM

## 2021-03-16 NOTE — [facility] Group Notes (Signed)
The focus of this group is to help patients establish daily goals to achieve during treatment and discuss how the patient can incorporate goal setting into their daily lives to aide in recovery.  Pt attended and participated in group 

## 2021-03-17 NOTE — [facility] Group Notes (Signed)
[facility] Group Notes:  (Nursing/MHT/Case Management/Adjunct)  Date:  03/17/2021  Time:  10:02 AM  Type of Therapy:  Group Therapy  Participation Level:  Active  Participation Quality:  Appropriate  Affect:  Appropriate  Cognitive:  Appropriate  Insight:  Appropriate  Engagement in Group:  Improving  Modes of Intervention:  Orientation  Summary of Progress/Problems: Her goal is to continue to take her medications and meet with the doctor.   Sable Knoles J Aleeta Schmaltz 03/17/2021, 10:02 AM

## 2021-03-17 NOTE — [facility] Counselor (Signed)
Pt remains on [facility] wait list at this time.    Ruthann Cancer MSW, LCSW Clincal Social Worker  [facility]

## 2021-03-17 NOTE — [facility] Group Notes (Signed)
Occupational Therapy Group Note Date: 03/17/2021 Group Topic/Focus: Goal-Setting  Group Description: Group encouraged increased engagement and participation through discussion and activity focused on Goal-setting and Accomplishments. Patients were encouraged to engage in discussion in sharing one of their accomplishments within the last 24 hours. Patients were then provided a handout and education around the benefits of goal-setting and actively engaged in identifying one treatment specific goal, one short term, one medium term, and one long term goal for the future. Discussion followed with patients sharing their goals and responses.  Therapeutic Goal(s): Identify benefit(s) and value(s) of goal-setting Identify and create personal goals related to one's own individual inpatient treatment. Identify and create short, medium, and long-term goals.  Participation Level: Active   Participation Quality: Independent   Behavior: Calm and Cooperative   Speech/Thought Process: Directed   Affect/Mood: Anxious and Constricted   Insight: Fair   Judgement: Fair   Individualization: [name] [name] was soft-spoken, however active in their participation of group discussion/activity. She identified her accomplishment today "I had a meeting with my SW and found out when I can discharge, hopefully tomorrow." Pt actively completed worksheet and shared goals with the group "get discharged, find a job with a good salary, and buy a car."  Modes of Intervention: Activity, Discussion and Education  Patient Response to Interventions:  Attentive and Engaged   Plan: Continue to engage patient in OT groups 2 - 3x/week.  03/17/2021  Donne Hazel, MOT, OTR/L

## 2021-03-17 NOTE — Progress Notes (Signed)
Recreation Therapy Notes  Date: 5.12.22 Time: 0945 Location: [address] Dayroom  Group Topic: Self-Esteem  Goal Area(s) Addresses:  Patient will successfully identify positive attributes about themselves.  Patient will successfully identify benefit of improved self-esteem.   Behavioral Response: Engaged  Intervention: Scientist, clinical (histocompatibility and immunogenetics), Markers, Magazines, Music  Activity: Collage.  Patients were to make a collage using the supplies provided to highlight the positive things about themselves.  LRT also played music while patients worked.  Education:  Self-Esteem, Discharge Planning  Education Outcome: Acknowledges education/In group clarification offered/Needs additional education  Clinical Observations/Feedback:  Pt found pictures of a lady in [place], [place] and a car.  Pt stated these are things she likes to do.  Pt expressed liking to drive, wear jeans and do makeup sometimes.  Pt stated the activity was easy for her to find things to represent herself.    [name], LRT/CTRS     [name] A 03/17/2021 11:28 AM

## 2021-03-17 NOTE — Progress Notes (Signed)
   03/17/21 1100  Psych Admission Type (Psych Patients Only)  Admission Status Involuntary  Psychosocial Assessment  Patient Complaints Anxiety  Eye Contact Brief  Facial Expression Worried  Affect Anxious;Preoccupied  Speech Logical/coherent;Soft  Interaction Cautious;Forwards little;Guarded  Motor Activity Other (Comment) (wdl)  Appearance/Hygiene Unremarkable  Behavior Characteristics Cooperative  Mood Anxious  Thought Process  Coherency Concrete thinking  Content Preoccupation;Paranoia  Delusions Paranoid  Perception Hallucinations  Hallucination Auditory  Judgment Limited  Confusion None  Danger to Self  Current suicidal ideation? Denies  Danger to Others  Danger to Others None reported or observed

## 2021-03-17 NOTE — Progress Notes (Signed)
[facility] MD Progress Note  03/17/2021 3:42 PM [name] [name]  MRN:  [id number]  Reason for admission:  Patient is a 45 year old female with a past psychiatric history significant for paranoid schizophrenia who originally presented to the behavioral health urgent care center on 02/28/2021 with chief complaint that she was "having problems with God".   Objective: Medical record reviewed.  Patient's case discussed in detail with members of the treatment team.  I met with and evaluated the patient today for follow-up on the unit.  Patient describes her mood as "all right" she is anxious for discharge.  She denies depressed mood, passive wish for death, SI, AI, HI, AH or VH.  She denies concerns that anyone in the hospital is trying to harm her.  She does state that people outside the hospital were playing mind games with her prior to admission.  Patient reports good sleep and adequate appetite.  She denies any physical problems.  She denies any side effects to her medications.  The patient states that she would like to discharge to her home.  She reports that she has a CST team that takes her shopping for groceries.  She states that she has been going to a day program from 9 AM to 2 PM Monday through Friday through a [facility] [facility] program.  Is unclear when she discusses this day program whether she has been recently attending or attended in the past since she does not appear to be affiliated with an ACT currently.  We have encouraged her to consider living in a group home or ALF as there is concern as to whether she will be able to manage living independently.  Patient [facility] on the [facility] list.  On exam eye contact is limited.  Speech is somewhat hypophonic with occasional increased latency of speech.  Affect is flat.  Intermittently she appears internally preoccupied as if attending to internal stimuli.  The patient slept 6.5 hours last night.  There are no new labs.  The patient refused vital signs this  morning.  She has been taking standing dose medications as prescribed.  She has not required any PRN medications.  Nursing staff continue to report that patient is seen occasionally mumbling to herself but denies hallucinations.  The patient has been attending and the participating in groups.  In group yesterday afternoon the patient expressed some paranoid sounding issues involving "phony associates."  Principal Problem: Schizophrenia ([place]) Diagnosis: Principal Problem:   Schizophrenia ([place])  Total Time spent with patient: 15 minutes  Past Psychiatric History: See admission H&P  Past Medical History:  Past Medical History:  Diagnosis Date  . Arthritis   . Depression   . Hallucinations   . Paranoid schizophrenia ([place])   . Schizoaffective disorder ([place])    History reviewed. No pertinent surgical history. Family History: History reviewed. No pertinent family history. Family Psychiatric  History: See admission H&P Social History:  Social History   Substance and Sexual Activity  Alcohol Use Not Currently   Comment: She denies      Social History   Substance and Sexual Activity  Drug Use Not Currently   Comment: She denies     Social History   Socioeconomic History  . Marital status: Single    Spouse name: Not on file  . Number of children: Not on file  . Years of education: Not on file  . Highest education level: Not on file  Occupational History  . Occupation: Disability  Tobacco Use  .  Smoking status: Current Every Day Smoker    Packs/day: 1.00    Types: Cigarettes  . Smokeless tobacco: Never Used  Vaping Use  . Vaping Use: Never used  Substance and Sexual Activity  . Alcohol use: Not Currently    Comment: She denies   . Drug use: Not Currently    Comment: She denies   . Sexual activity: Not Currently  Other Topics Concern  . Not on file  Social History Narrative   Pt stated that she lives in [place], and that she lives alone.  Pt receives outpatient  psychiatric resources through [employer].   Social Determinants of Health   Financial Resource Strain: Not on file  Food Insecurity: Not on file  Transportation Needs: Not on file  Physical Activity: Not on file  Stress: Not on file  Social Connections: Not on file   Additional Social History:                         Sleep: Good  Appetite:  Fair  Current Medications: Current Facility-Administered Medications  Medication Dose Route Frequency Provider Last Rate Last Admin  . acetaminophen (TYLENOL) tablet 650 mg  650 mg Oral Q6H PRN [name], MD   650 mg at 03/12/21 0532  . alum & mag hydroxide-simeth (MAALOX/MYLANTA) 200-200-20 MG/5ML suspension 30 mL  30 mL Oral Q4H PRN [name], MD   30 mL at 03/12/21 1236  . benztropine (COGENTIN) tablet 1 mg  1 mg Oral BID PRN [name], MD      . diphenhydrAMINE (BENADRYL) injection 25 mg  25 mg Intramuscular Q6H PRN Arthor Captain, MD      . famotidine (PEPCID) tablet 20 mg  20 mg Oral BID [name], MD   20 mg at 03/17/21 0807  . feeding supplement (ENSURE ENLIVE / ENSURE PLUS) liquid 237 mL  237 mL Oral BID BM [name], MD   237 mL at 03/16/21 1447  . haloperidol (HALDOL) tablet 10 mg  10 mg Oral BID [name], MD   10 mg at 03/17/21 7591  . haloperidol (HALDOL) tablet 15 mg  15 mg Oral QHS [name], MD   15 mg at 03/16/21 2116  . magnesium hydroxide (MILK OF MAGNESIA) suspension 30 mL  30 mL Oral Daily PRN [name], MD      . OLANZapine zydis (ZYPREXA) disintegrating tablet 10 mg  10 mg Oral Q8H PRN [name], MD   10 mg at 03/13/21 1639  . ondansetron (ZOFRAN-ODT) disintegrating tablet 4 mg  4 mg Oral Q8H PRN [name], MD   4 mg at 03/12/21 1544  . polyethylene glycol (MIRALAX / GLYCOLAX) packet 17 g  17 g Oral Daily [name], MD   17 g at 03/15/21 0810  . risperiDONE (RISPERDAL M-TABS) disintegrating tablet 2 mg  2 mg  Oral Daily [name], MD   2 mg at 03/17/21 0807  . risperiDONE (RISPERDAL M-TABS) disintegrating tablet 6 mg  6 mg Oral QHS [name], MD   6 mg at 03/16/21 2116  . senna-docusate (Senokot-S) tablet 1 tablet  1 tablet Oral QHS [name], MD   1 tablet at 03/16/21 2116  . traZODone (DESYREL) tablet 150 mg  150 mg Oral QHS [name], MD   150 mg at 03/16/21 2117    Lab Results:  No results found for  this or any previous visit (from the past 48 hour(s)).  Blood Alcohol level:  Lab Results  Component Value Date   ETH <10 05/19/2020   ETH <10 58/25/1898    Metabolic Disorder Labs: Lab Results  Component Value Date   HGBA1C 5.1 02/28/2021   MPG 99.67 02/28/2021   MPG 96.8 05/19/2020   Lab Results  Component Value Date   PROLACTIN 28.9 (H) 06/26/2018   Lab Results  Component Value Date   CHOL 138 02/28/2021   TRIG 69 02/28/2021   HDL 50 02/28/2021   CHOLHDL 2.8 02/28/2021   VLDL 14 02/28/2021   LDLCALC 74 02/28/2021   LDLCALC 121 (H) 05/19/2020    Physical Findings: AIMS: Facial and Oral Movements Muscles of Facial Expression: None, normal Lips and Perioral Area: None, normal Jaw: None, normal Tongue: None, normal,Extremity Movements Upper (arms, wrists, hands, fingers): None, normal Lower (legs, knees, ankles, toes): None, normal, Trunk Movements Neck, shoulders, hips: None, normal, Overall Severity Severity of abnormal movements (highest score from questions above): None, normal Incapacitation due to abnormal movements: None, normal Patient's awareness of abnormal movements (rate only patient's report): No Awareness, Dental Status Current problems with teeth and/or dentures?: No Does patient usually wear dentures?: No  CIWA:    COWS:     Musculoskeletal: Strength & Muscle Tone: within normal limits Gait & Station: normal Patient leans: N/A  Psychiatric Specialty Exam:  Presentation  General Appearance: Appropriate for  Environment  Eye Contact:Minimal  Speech:Normal Rate  Speech Volume:Decreased  Handedness:Right   Mood and Affect  Mood:Anxious ("alright")  Affect:Flat   Thought Process  Thought Processes:Goal Directed  Descriptions of Associations:Loose  Orientation:Full (Time, Place and Person)  Thought Content:Paranoid Ideation (Patient denies paranoia in the hospital but states that others were playing mind games with her prior to admission)  History of Schizophrenia/Schizoaffective disorder:Yes  Duration of Psychotic Symptoms:Greater than six months  Hallucinations:Hallucinations: Other (comment) (Patient denies hallucinations but has been observed to respond to internal stimuli by staff)  Ideas of Reference:Paranoia  Suicidal Thoughts:Suicidal Thoughts: No  Homicidal Thoughts:Homicidal Thoughts: No   Sensorium  Memory:Immediate Fair; Recent Fair; Remote Fair  Judgment:Impaired  Insight:Poor   Executive Functions  Concentration:Fair  Attention Span:Fair  [place]  Language:Good   Psychomotor Activity  Psychomotor Activity:Psychomotor Activity: Normal   Assets  Assets:Desire for Improvement; Resilience   Sleep  Sleep:Sleep: Good Number of Hours of Sleep: 6.5    Physical Exam: Physical Exam Vitals and nursing note reviewed.  HENT:     Head: Normocephalic and atraumatic.  Pulmonary:     Effort: Pulmonary effort is normal.  Neurological:     General: No focal deficit present.     Mental Status: She is alert.    Review of Systems  Constitutional: Negative for chills, diaphoresis and fever.  HENT: Negative for sore throat.   Respiratory: Negative for cough and shortness of breath.   Cardiovascular: Negative for chest pain and palpitations.  Gastrointestinal: Negative for nausea and vomiting.  Musculoskeletal: Negative.   Skin: Negative for rash.  Neurological: Negative for dizziness and tremors.   Psychiatric/Behavioral: Positive for hallucinations. Negative for suicidal ideas. The patient does not have insomnia.        Patient denies hallucinations but appears to respond to internal stimuli at times   Blood pressure 124/79, pulse (!) 104, temperature 97.6 F (36.4 C), temperature source Oral, resp. rate 16, height 4' 10"  (1.473 m), weight 42.2 kg, SpO2 100 %. Body mass index  is 19.44 kg/m.   Treatment Plan Summary: Daily contact with patient to assess and evaluate symptoms and progress in treatment, Medication management and Plan:  Diagnosis: 1.  Schizophrenia; undifferentiated. 2.  Tachycardia resolved. 3.  Constipation improved.  Pertinent findings on examination today: 1.  Patient is essentially unchanged from yesterday.  She likely is at or near her baseline 2.  Patient is sleeping well 3.  Patient denies all symptoms.  Plan: 1.  Continue Cogentin 1 mg p.o. twice daily as needed tremors. 2.  Continue famotidine 20 mg p.o. twice daily for GERD. 3.  Continue haloperidol 10 mg p.o. twice daily and 15 mg p.o. nightly for psychosis and sleep. 4.  Continue Zyprexa Zydis agitation protocol as needed. 5.  Continue Zofran 4 mg p.o. every 8 hours as needed nausea and vomiting. 6.  Continue MiraLAX 17 g in 8 ounces of water p.o. daily for constipation. 7.  Continue Risperdal 2 mg p.o. daily and 6 mg p.o. nightly for psychosis, mood stability and sleep. 8.  Continue Senokot S1 tablet p.o. nightly for constipation. 9.  Continue trazodone 150 mg p.o. nightly for insomnia. 10.  Disposition planning-patient continues to say that she has a condominium in her house in the [place] area, but we have been unable to confirm this. She reports that she is followed by [facility] team.  Social work is investigating whether patient is currently connected with this ACT team.  Suspect patient would have difficulty caring for herself independently.  This concern needs to be addressed  prior to discharge.  Is on the [facility] waitlist.  The patient has declined group home or ALF placement.  Arthor Captain, MD 03/17/2021, 3:42 PM

## 2021-03-17 NOTE — Progress Notes (Signed)
Pt continues to be visible on the unit , but keeps to herself even in the dayroom. Pt continues to respond to internal stimuli. Pt given HS medications without incident    03/17/21 2300  Psych Admission Type (Psych Patients Only)  Admission Status Involuntary  Psychosocial Assessment  Patient Complaints Anxiety  Eye Contact Brief  Facial Expression Worried  Affect Anxious;Preoccupied  Speech Logical/coherent;Soft  Interaction Cautious;Forwards little;Guarded  Motor Activity Other (Comment) (wdl)  Appearance/Hygiene Unremarkable  Behavior Characteristics Cooperative  Mood Suspicious  Thought Process  Coherency Concrete thinking  Content Preoccupation;Paranoia  Delusions Paranoid  Perception Hallucinations  Hallucination Auditory  Judgment Limited  Confusion None  Danger to Self  Current suicidal ideation? Denies  Danger to Others  Danger to Others None reported or observed

## 2021-03-17 NOTE — Plan of Care (Signed)
  Problem: Education: Goal: Mental status will improve Outcome: Progressing   Problem: Activity: Goal: Sleeping patterns will improve Outcome: Progressing   Problem: Health Behavior/Discharge Planning: Goal: Compliance with treatment plan for underlying cause of condition will improve Outcome: Progressing   

## 2021-03-18 NOTE — Plan of Care (Signed)
Continues to endorse hallucinations and paranoia but cooperative and active in the milieu. Frequently seeking to speak to nursing . Taking medications, eating and sleeping well. Patient has stayed in the milieu with no major problem. Safety precautions maintained.

## 2021-03-18 NOTE — Progress Notes (Signed)
   03/18/21 0500  Sleep  Number of Hours 8   

## 2021-03-18 NOTE — BHH Group Notes (Signed)
B[facility]/BMU LCSW Group Therapy Note  Date/Time:  03/18/2021 11:00AM-12:00PM  Type of Therapy and Topic:  Group Therapy:  Self-Care   Participation Level:  Minimal   Description of Group This process group involved patients discussing the importance of self-care in different areas of life (professional, personal, emotional, psychological, spiritual, and physical) in order to achieve healthy life balance.  The group talked about what self-care in each of those areas would constitute and then specifically listed how they want to provide themselves with improved self-care in this new year.      Therapeutic Goals 1. Patient will learn how to break self-care down into various areas of life 2. Patient will participate in generating ideas about healthy self-care options in each category 3. Patients will be supportive of one another and receive support from others 4. Patient will identify one healthy self-care activity to add to his/her life this year  Summary of Patient Progress:  Pt attended group. Pt identified window shopping and spa/salon as her self care methods.  Therapeutic Modalities Processing Psychoeducation   Ruthann Cancer MSW, LCSW Clincal Social Worker  [facility]

## 2021-03-18 NOTE — Progress Notes (Signed)
[facility] MD Progress Note  03/18/2021 4:49 PM [name] [name]  MRN:  [id number]  Reason for admission:  Patient is a 45 year old female with a past psychiatric history significant for paranoid schizophrenia who originally presented to the behavioral health urgent care center on 02/28/2021 with chief complaint that she was "having problems with God".   Objective: Medical record reviewed.  Patient's case discussed in detail with members of the treatment team.  I met with and evaluated the patient today for follow-up on the unit.  Patient reports that she is doing well.  She states that she is sleeping and eating well.  The patient denies AH, VH, PI, SI, HI, AI.  She denies any disturbing thoughts.  Patient reports that her mood is good.  She denies medication side effects.  She denies any physical problems.  The patient states that her plan for after this hospitalization is to return to her apartment and attend the day program and work with her CST team.  Patient slept 8 hours last night.  Lines this morning were BP of 117/78 sitting and 115/79 standing, pulse of 102 sitting and 122 standing, respirations 16, O2 sat 100% and temperature of 98.4.  There are no new labs.  Nursing notes document that patient continues to respond to internal stimuli.  She has been attending groups intermittently and at times participating.  She does not interact socially with peers.  Principal Problem: Schizophrenia ([place]) Diagnosis: Principal Problem:   Schizophrenia ([place])  Total Time spent with patient: 15 minutes  Past Psychiatric History: See admission H&P  Past Medical History:  Past Medical History:  Diagnosis Date  . Arthritis   . Depression   . Hallucinations   . Paranoid schizophrenia ([place])   . Schizoaffective disorder ([place])    History reviewed. No pertinent surgical history. Family History: History reviewed. No pertinent family history. Family Psychiatric  History: See admission H&P Social History:  Social  History   Substance and Sexual Activity  Alcohol Use Not Currently   Comment: She denies      Social History   Substance and Sexual Activity  Drug Use Not Currently   Comment: She denies     Social History   Socioeconomic History  . Marital status: Single    Spouse name: Not on file  . Number of children: Not on file  . Years of education: Not on file  . Highest education level: Not on file  Occupational History  . Occupation: Disability  Tobacco Use  . Smoking status: Current Every Day Smoker    Packs/day: 1.00    Types: Cigarettes  . Smokeless tobacco: Never Used  Vaping Use  . Vaping Use: Never used  Substance and Sexual Activity  . Alcohol use: Not Currently    Comment: She denies   . Drug use: Not Currently    Comment: She denies   . Sexual activity: Not Currently  Other Topics Concern  . Not on file  Social History Narrative   Pt stated that she lives in [place], and that she lives alone.  Pt receives outpatient psychiatric resources through [facility] [employer].   Social Determinants of Health   Financial Resource Strain: Not on file  Food Insecurity: Not on file  Transportation Needs: Not on file  Physical Activity: Not on file  Stress: Not on file  Social Connections: Not on file   Additional Social History:  Sleep: Good  Appetite:  Fair  Current Medications: Current Facility-Administered Medications  Medication Dose Route Frequency Provider Last Rate Last Admin  . acetaminophen (TYLENOL) tablet 650 mg  650 mg Oral Q6H PRN [name], MD   650 mg at 03/12/21 0532  . alum & mag hydroxide-simeth (MAALOX/MYLANTA) 200-200-20 MG/5ML suspension 30 mL  30 mL Oral Q4H PRN [name], MD   30 mL at 03/12/21 1236  . benztropine (COGENTIN) tablet 1 mg  1 mg Oral BID PRN [name], MD      . diphenhydrAMINE (BENADRYL) injection 25 mg  25 mg Intramuscular Q6H PRN Arthor Captain, MD      . famotidine  (PEPCID) tablet 20 mg  20 mg Oral BID [name], MD   20 mg at 03/18/21 0820  . feeding supplement (ENSURE ENLIVE / ENSURE PLUS) liquid 237 mL  237 mL Oral BID BM [name], MD   237 mL at 03/18/21 1340  . haloperidol (HALDOL) tablet 10 mg  10 mg Oral BID [name], MD   10 mg at 03/18/21 0819  . haloperidol (HALDOL) tablet 15 mg  15 mg Oral QHS [name], MD   15 mg at 03/17/21 2101  . magnesium hydroxide (MILK OF MAGNESIA) suspension 30 mL  30 mL Oral Daily PRN [name], MD      . OLANZapine zydis (ZYPREXA) disintegrating tablet 10 mg  10 mg Oral Q8H PRN [name], MD   10 mg at 03/13/21 1639  . ondansetron (ZOFRAN-ODT) disintegrating tablet 4 mg  4 mg Oral Q8H PRN [name], MD   4 mg at 03/12/21 1544  . polyethylene glycol (MIRALAX / GLYCOLAX) packet 17 g  17 g Oral Daily [name], MD   17 g at 03/15/21 0810  . risperiDONE (RISPERDAL M-TABS) disintegrating tablet 2 mg  2 mg Oral Daily [name], MD   2 mg at 03/18/21 0819  . risperiDONE (RISPERDAL M-TABS) disintegrating tablet 6 mg  6 mg Oral QHS [name], MD   6 mg at 03/17/21 2101  . senna-docusate (Senokot-S) tablet 1 tablet  1 tablet Oral QHS [name], MD   1 tablet at 03/17/21 2103  . traZODone (DESYREL) tablet 150 mg  150 mg Oral QHS [name], MD   150 mg at 03/17/21 2103    Lab Results:  No results found for this or any previous visit (from the past 85 hour(s)).  Blood Alcohol level:  Lab Results  Component Value Date   ETH <10 05/19/2020   ETH <10 74/10/8785    Metabolic Disorder Labs: Lab Results  Component Value Date   HGBA1C 5.1 02/28/2021   MPG 99.67 02/28/2021   MPG 96.8 05/19/2020   Lab Results  Component Value Date   PROLACTIN 28.9 (H) 06/26/2018   Lab Results  Component Value Date   CHOL 138 02/28/2021   TRIG 69 02/28/2021   HDL 50 02/28/2021   CHOLHDL 2.8 02/28/2021   VLDL 14 02/28/2021    LDLCALC 74 02/28/2021   LDLCALC 121 (H) 05/19/2020    Physical Findings: AIMS: Facial and Oral Movements Muscles of Facial Expression: None, normal Lips and Perioral Area: None, normal Jaw: None, normal Tongue: None, normal,Extremity Movements Upper (arms, wrists, hands, fingers): None, normal Lower (legs, knees, ankles, toes): None, normal, Trunk Movements Neck, shoulders, hips: None, normal, Overall Severity Severity of abnormal movements (highest score from questions  above): None, normal Incapacitation due to abnormal movements: None, normal Patient's awareness of abnormal movements (rate only patient's report): No Awareness, Dental Status Current problems with teeth and/or dentures?: No Does patient usually wear dentures?: No  CIWA:    COWS:     Musculoskeletal: Strength & Muscle Tone: within normal limits Gait & Station: normal Patient leans: N/A  Psychiatric Specialty Exam:  Presentation  General Appearance: Appropriate for Environment  Eye Contact:Minimal  Speech:Normal Rate  Speech Volume:Decreased  Handedness:Right   Mood and Affect  Mood:Anxious ("alright")  Affect:Flat   Thought Process  Thought Processes:Goal Directed  Descriptions of Associations:Loose  Orientation:Full (Time, Place and Person)  Thought Content:Paranoid Ideation (Patient denies paranoia in the hospital but states that others were playing mind games with her prior to admission)  History of Schizophrenia/Schizoaffective disorder:Yes  Duration of Psychotic Symptoms:Greater than six months  Hallucinations:Hallucinations: Other (comment) (Patient denies hallucinations but has been observed to respond to internal stimuli by staff)  Ideas of Reference:Paranoia  Suicidal Thoughts:Suicidal Thoughts: No  Homicidal Thoughts:Homicidal Thoughts: No   Sensorium  Memory:Immediate Fair; Recent Fair; Remote Fair  Judgment:Impaired  Insight:Poor   Executive Functions   Concentration:Fair  Attention Span:Fair  [place]  Language:Good   Psychomotor Activity  Psychomotor Activity:Psychomotor Activity: Normal   Assets  Assets:Desire for Improvement; Resilience   Sleep  Sleep:Sleep: Good Number of Hours of Sleep: 6.5    Physical Exam: Physical Exam Vitals and nursing note reviewed.  HENT:     Head: Normocephalic and atraumatic.  Pulmonary:     Effort: Pulmonary effort is normal.  Neurological:     General: No focal deficit present.     Mental Status: She is alert.    Review of Systems  Constitutional: Negative for chills, diaphoresis and fever.  HENT: Negative for sore throat.   Respiratory: Negative for cough and shortness of breath.   Cardiovascular: Negative for chest pain and palpitations.  Gastrointestinal: Negative for nausea and vomiting.  Musculoskeletal: Negative.   Skin: Negative for rash.  Neurological: Negative for dizziness and tremors.  Psychiatric/Behavioral: Positive for hallucinations. Negative for suicidal ideas. The patient does not have insomnia.        Patient denies hallucinations but appears to respond to internal stimuli at times   Blood pressure 115/79, pulse (!) 122, temperature 98.4 F (36.9 C), temperature source Oral, resp. rate 16, height 4' 10"  (1.473 m), weight 42.2 kg, SpO2 100 %. Body mass index is 19.44 kg/m.   Treatment Plan Summary: Daily contact with patient to assess and evaluate symptoms and progress in treatment, Medication management and Plan:  Diagnosis: 1.  Schizophrenia; undifferentiated. 2.  Tachycardia improved 3.  Constipation improved.  Pertinent findings on examination today: 1.  Patient is essentially unchanged from yesterday.  She likely is at or near her baseline 2.  Patient is sleeping well 3.  Patient denies all symptoms.  Plan: 1.  Continue Cogentin 1 mg p.o. twice daily as needed tremors. 2.  Continue famotidine 20 mg p.o. twice  daily for GERD. 3.  Continue haloperidol 10 mg p.o. twice daily and 15 mg p.o. nightly for psychosis and sleep. 4.  Continue Zyprexa Zydis agitation protocol as needed. 5.  Continue Zofran 4 mg p.o. every 8 hours as needed nausea and vomiting. 6.  Continue MiraLAX 17 g in 8 ounces of water p.o. daily for constipation. 7.  Continue Risperdal 2 mg p.o. daily and 6 mg p.o. nightly for psychosis, mood stability and sleep. 8.  Continue Senokot S1 tablet p.o. nightly for constipation. 9.  Continue trazodone 150 mg p.o. nightly for insomnia. 10.  Disposition planning-patient continues to say that she has a condominium in her house in the [place] area, but we have been unable to confirm this. She reports that she is followed by [facility] team.  Social work is investigating whether patient is currently connected with this ACT team.  Suspect patient would have difficulty caring for herself independently.  This concern needs to be addressed prior to discharge.  She is on the [facility] waitlist.  The patient has declined group home or ALF placement.  Arthor Captain, MD 03/18/2021, 4:49 PM

## 2021-03-18 NOTE — Care Management Important Message (Signed)
Medicare IM given to Ruthann Cancer, LCSW to give to the patient.

## 2021-03-18 NOTE — [facility] Group Notes (Signed)
[facility] Group Notes:  (Nursing/MHT/Case Management/Adjunct)  Date:  03/18/2021  Time:  10:59 AM  Type of Therapy:  Group Therapy  Participation Level:  Active  Participation Quality:  Appropriate  Affect:  Appropriate  Cognitive:  Appropriate  Insight:  Improving  Engagement in Group:  Engaged  Modes of Intervention:  Orientation  Summary of Progress/Problems: Her goal is to take her medications and continue to work on herself.   Shaleta Ruacho J Montarius Kitagawa 03/18/2021, 10:59 AM

## 2021-03-18 NOTE — Progress Notes (Signed)
SPIRITUALITY GROUP NOTE  Spirituality group facilitated by Wilkie Aye, MDiv, B[facility].  Group Description:  Group focused on topic of hope.  Patients participated in facilitated discussion around topic, connecting with one another around experiences and definitions for hope.  Group members engaged with visual explorer photos, reflecting on what hope looks like for them today.  Group engaged in discussion around how their definitions of hope are present today in hospital.   Modalities: Psycho-social ed, [place]lerian, Narrative, MI Patient Progress:  [name] [name] was invited.  Did not attend group.

## 2021-03-18 NOTE — Progress Notes (Signed)
Recreation Therapy Notes  Date: 5.13.22 Time: 1000 Location: [address] Dayroom   Group Topic: Leisure Education  Goal Area(s) Addresses:  Patient will identify positive leisure activities for use post discharge. Patient will identify at least one positive benefit of participation in leisure activities.   Behavioral Response: None  Intervention: Markers, White Board, Strip of paper with various activities  Activity:  As a group, patients and LRT would take turns picking a strip of paper from the container.  The person would then draw whatever is on the paper onto the board.  The rest of the group were to try and guess what the picture was.  The group had one minute to guess the picture.  The person who guesses the picture gets the next turn, if no one guesses the picture, LRT would pick someone to go next.   Education: Leisure Scientist, physiological, Special educational needs teacher, Teamwork, Discharge Planning  Education Outcome: Acknowledges education/In group clarification offered/Needs additional education.   Clinical Observations/Feedback: Pt did not participate in activity. Pt sat and observed.   [name], LRT/CTRS        [name] A 03/18/2021 11:54 AM

## 2021-03-18 NOTE — Progress Notes (Signed)
D: Pt denies SI/HI. She reports hearing voices but not command in nature,she denies visual hallucinations. She is occasionally seen responding to internal stimuli.Pt is cooperative with care.  A: Pt was offered support and encouragement. Pt was given scheduled medications. Pt was encourage to attend groups. Q 15 minute checks were done for safety.   R:Pt attended evening wrap up group but did not actively participate. She interacts with peers minimally. Pt is taking medication, no prns needed or requested for this evening.. Pt has no complaints.Pt receptive to treatment and safety maintained on unit.

## 2021-03-18 NOTE — Plan of Care (Signed)
  Problem: Education: Goal: Ability to state activities that reduce stress will improve Outcome: Progressing   Problem: Coping: Goal: Ability to identify and develop effective coping behavior will improve Outcome: Progressing   Problem: Self-Concept: Goal: Ability to identify factors that promote anxiety will improve Outcome: Progressing Goal: Level of anxiety will decrease Outcome: Progressing Goal: Ability to modify response to factors that promote anxiety will improve Outcome: Progressing   Problem: Education: Goal: Knowledge of [place] [facility] Education information/materials will improve Outcome: Progressing Goal: Emotional status will improve Outcome: Progressing Goal: Mental status will improve Outcome: Progressing Goal: Verbalization of understanding the information provided will improve Outcome: Progressing   Problem: Activity: Goal: Interest or engagement in activities will improve Outcome: Progressing Goal: Sleeping patterns will improve Outcome: Progressing   Problem: Coping: Goal: Ability to verbalize frustrations and anger appropriately will improve Outcome: Progressing Goal: Ability to demonstrate self-control will improve Outcome: Progressing   Problem: Health Behavior/Discharge Planning: Goal: Identification of resources available to assist in meeting health care needs will improve Outcome: Progressing   Problem: Safety: Goal: Periods of time without injury will increase Outcome: Progressing   Problem: Activity: Goal: Will identify at least one activity in which they can participate Outcome: Progressing   Problem: Coping: Goal: Ability to identify and develop effective coping behavior will improve Outcome: Progressing

## 2021-03-19 DIAGNOSIS — F203 Undifferentiated schizophrenia: Secondary | ICD-10-CM | POA: Diagnosis not present

## 2021-03-19 NOTE — [facility] Group Notes (Incomplete)
Mar 19, 2021     1500-1600   REC THERAPY  Therapeutic Recreation:    Choice of fun activities in the gym/outside, incorporated with practicing social interaction with peers.        Ryllie Nieland A. Meir Elwood RN  

## 2021-03-19 NOTE — Progress Notes (Signed)
Patient has been isolative to her room and did not attend group tonight. She spoke with Clinical research associate about when is she going to be discharged. She is still responds to internal stimuli but is pleasant with staff.

## 2021-03-19 NOTE — Progress Notes (Signed)
Adult Psychoeducational Group Note  Date:  03/19/2021 Time:  9:59 PM  Group Topic/Focus:  Wrap-Up Group:   The focus of this group is to help patients review their daily goal of treatment and discuss progress on daily workbooks.  Participation Level:  Did Not Attend  Participation Quality:  Did Not Attend  Affect:  Did Not Attend  Cognitive:  Did Not Attend  Insight: None  Engagement in Group:  Did Not Attend  Modes of Intervention:  Did Not Attend  Additional Comments:  Pt did not attend evening wrap up group tonight.  Felipa Furnace 03/19/2021, 9:59 PM

## 2021-03-19 NOTE — Progress Notes (Signed)
[facility] MD Progress Note  03/19/2021 8:47 AM [name] [name]  MRN:  [id number]  Reason for admission:  Patient is a 45 year old female with a past psychiatric history significant for paranoid schizophrenia who originally presented to the behavioral health urgent care center on 02/28/2021 with chief complaint that she was "having problems with God".   Objective: Medical record reviewed.  Patient's case discussed in detail with members of the treatment team.  I met with and evaluated the patient today for follow-up on the unit.  Patient reports that she is doing well.  In regards to her 23-month psychiatric hospitalization in [place], she states "they made a mistake".  She describes that she went to [place] because she has family there, but has since returned home to [place].  She states that she has housing and works with [facility] team.  She states that her social worker is Balinda Quails [phone]) and requests that we contact her regarding discharge planning.  Patient states that she received a Haldol Decanoate injection on 03/02/2021.  She states that she would rather not get injections, but is agreeable to continuing p.o. medication.  Patient is on multiple antipsychotics for control of psychosis.  She has been taking medications while on the unit, and denies side effects.  She states that she is sleeping and eating well.  The patient denies AH, VH, PI, SI, HI, AI.  She denies any disturbing thoughts.  Patient is perseverative today on getting discharged.  She is intrusive with staff, providers, and social work in order to hurry her discharge. Patient reports that her mood is good.   Nursing notes document that patient continues to respond to internal stimuli, and document that she endorses auditory hallucinations, however denies command auditory hallucinations.  She has been attending groups intermittently and at times participating.  She does not interact socially with peers, and did not  attend recreation therapy.  There are no new labs.    Principal Problem: Schizophrenia ([facility]) Diagnosis: Principal Problem:   Schizophrenia ([place])    Total Time Spent in Direct Patient Care:  I personally spent 35 minutes on the unit in direct patient care. The direct patient care time included face-to-face time with the patient, reviewing the patient's chart, communicating with other professionals, and coordinating care. Greater than 50% of this time was spent in counseling or coordinating care with the patient regarding goals of hospitalization, psycho-education, and discharge planning needs.   Past Psychiatric History: See admission H&P  Past Medical History:  Past Medical History:  Diagnosis Date  . Arthritis   . Depression   . Hallucinations   . Paranoid schizophrenia ([place])   . Schizoaffective disorder ([place])    History reviewed. No pertinent surgical history. Family History: History reviewed. No pertinent family history. Family Psychiatric  History: See admission H&P Social History:  Social History   Substance and Sexual Activity  Alcohol Use Not Currently   Comment: She denies      Social History   Substance and Sexual Activity  Drug Use Not Currently   Comment: She denies     Social History   Socioeconomic History  . Marital status: Single    Spouse name: Not on file  . Number of children: Not on file  . Years of education: Not on file  . Highest education level: Not on file  Occupational History  . Occupation: Disability  Tobacco Use  . Smoking status: Current Every Day Smoker    Packs/day: 1.00  Types: Cigarettes  . Smokeless tobacco: Never Used  Vaping Use  . Vaping Use: Never used  Substance and Sexual Activity  . Alcohol use: Not Currently    Comment: She denies   . Drug use: Not Currently    Comment: She denies   . Sexual activity: Not Currently  Other Topics Concern  . Not on file  Social History Narrative   Pt stated that she lives in  [place], and that she lives alone.  Pt receives outpatient psychiatric resources through [facility].   Social Determinants of Health   Financial Resource Strain: Not on file  Food Insecurity: Not on file  Transportation Needs: Not on file  Physical Activity: Not on file  Stress: Not on file  Social Connections: Not on file   Additional Social History:                         Sleep: Good  Appetite: Good  Current Medications: Current Facility-Administered Medications  Medication Dose Route Frequency Provider Last Rate Last Admin  . acetaminophen (TYLENOL) tablet 650 mg  650 mg Oral Q6H PRN [name], MD   650 mg at 03/12/21 0532  . alum & mag hydroxide-simeth (MAALOX/MYLANTA) 200-200-20 MG/5ML suspension 30 mL  30 mL Oral Q4H PRN [name], MD   30 mL at 03/12/21 1236  . benztropine (COGENTIN) tablet 1 mg  1 mg Oral BID PRN [name], MD      . diphenhydrAMINE (BENADRYL) injection 25 mg  25 mg Intramuscular Q6H PRN Arthor Captain, MD      . famotidine (PEPCID) tablet 20 mg  20 mg Oral BID [name], MD   20 mg at 03/19/21 1610  . feeding supplement (ENSURE ENLIVE / ENSURE PLUS) liquid 237 mL  237 mL Oral BID BM [name], MD   237 mL at 03/18/21 1340  . haloperidol (HALDOL) tablet 10 mg  10 mg Oral BID [name], MD   10 mg at 03/19/21 0827  . haloperidol (HALDOL) tablet 15 mg  15 mg Oral QHS [name], MD   15 mg at 03/18/21 2030  . magnesium hydroxide (MILK OF MAGNESIA) suspension 30 mL  30 mL Oral Daily PRN [name], MD      . OLANZapine zydis (ZYPREXA) disintegrating tablet 10 mg  10 mg Oral Q8H PRN [name], MD   10 mg at 03/13/21 1639  . ondansetron (ZOFRAN-ODT) disintegrating tablet 4 mg  4 mg Oral Q8H PRN [name], MD   4 mg at 03/12/21 1544  . polyethylene glycol (MIRALAX / GLYCOLAX) packet 17 g  17 g Oral Daily [name], MD   17 g at 03/15/21 0810  .  risperiDONE (RISPERDAL M-TABS) disintegrating tablet 2 mg  2 mg Oral Daily [name], MD   2 mg at 03/19/21 9604  . risperiDONE (RISPERDAL M-TABS) disintegrating tablet 6 mg  6 mg Oral QHS [name], MD   6 mg at 03/18/21 2031  . senna-docusate (Senokot-S) tablet 1 tablet  1 tablet Oral QHS [name], MD   1 tablet at 03/18/21 2035  . traZODone (DESYREL) tablet 150 mg  150 mg Oral QHS [name], MD   150 mg at 03/18/21 2030    Lab Results:  No results found for this or any previous visit (from the past 61 hour(s)).  Blood Alcohol level:  Lab Results  Component Value Date   ETH <10 05/19/2020   ETH <10 12/75/1700    Metabolic Disorder Labs: Lab Results  Component Value Date   HGBA1C 5.1 02/28/2021   MPG 99.67 02/28/2021   MPG 96.8 05/19/2020   Lab Results  Component Value Date   PROLACTIN 28.9 (H) 06/26/2018   Lab Results  Component Value Date   CHOL 138 02/28/2021   TRIG 69 02/28/2021   HDL 50 02/28/2021   CHOLHDL 2.8 02/28/2021   VLDL 14 02/28/2021   LDLCALC 74 02/28/2021   LDLCALC 121 (H) 05/19/2020    Physical Findings: AIMS: Facial and Oral Movements Muscles of Facial Expression: None, normal Lips and Perioral Area: None, normal Jaw: None, normal Tongue: None, normal,Extremity Movements Upper (arms, wrists, hands, fingers): None, normal Lower (legs, knees, ankles, toes): None, normal, Trunk Movements Neck, shoulders, hips: None, normal, Overall Severity Severity of abnormal movements (highest score from questions above): None, normal Incapacitation due to abnormal movements: None, normal Patient's awareness of abnormal movements (rate only patient's report): No Awareness, Dental Status Current problems with teeth and/or dentures?: No Does patient usually wear dentures?: No  CIWA:    COWS:     Musculoskeletal: Strength & Muscle Tone: within normal limits Gait & Station: normal Patient leans: N/A   Psychiatric Specialty  Exam: Physical Exam Vitals and nursing note reviewed.  HENT:     Head: Normocephalic and atraumatic.  Eyes:     Extraocular Movements: Extraocular movements intact.  Cardiovascular:     Rate and Rhythm: Normal rate.  Pulmonary:     Effort: Pulmonary effort is normal.  Musculoskeletal:        General: Normal range of motion.  Neurological:     General: No focal deficit present.     Mental Status: She is alert and oriented to person, place, and time.      Review of Systems  Constitutional: Negative for chills, diaphoresis and fever.  HENT: Negative for sore throat.   Respiratory: Negative for cough and shortness of breath.   Cardiovascular: Negative for chest pain and palpitations.  Gastrointestinal: Negative for nausea and vomiting.  Musculoskeletal: Negative.   Skin: Negative for rash.  Neurological: Negative for dizziness and tremors.  Psychiatric/Behavioral: Positive for agitation, decreased concentration, dysphoric mood and hallucinations. Negative for behavioral problems, confusion, self-injury, sleep disturbance and suicidal ideas. The patient is not nervous/anxious, does not have insomnia and is not hyperactive.        Patient denies hallucinations but appears to respond to internal stimuli at times    Blood pressure 111/82, pulse 98, temperature 97.8 F (36.6 C), temperature source Oral, resp. rate 16, height _0  (1.473 m), weight 42.2 kg, SpO2 97 %.Body mass index is 19.44 kg/m.  General Appearance: Disheveled  Eye Contact:  Fair  Speech:  Garbled  Volume:  Decreased  Mood:  Anxious  Affect:  Constricted  Thought Process:  Goal Directed  Orientation:  Full (Time, Place, and Person)  Thought Content:  Hallucinations: Auditory and Rumination  Suicidal Thoughts:  No  Homicidal Thoughts:  No  Memory:  Recent;   Good  Judgement:  Impaired  Insight:  Shallow  Psychomotor Activity:  Restlessness  Concentration:  Concentration: Fair  Recall:  [employer] of  Knowledge:  Fair  Language:  Good  Akathisia:  No  Handed:  Right  AIMS (if indicated):     Assets:  Communication Skills Resilience  ADL's:  Intact  Cognition:  Impaired,  Mild  Sleep:  Number of Hours: 7.25    Psychiatric Specialty Exam:  Presentation  General Appearance: Appropriate for Environment  Eye Contact:Minimal  Speech:Normal Rate  Speech Volume:Decreased  Handedness:Right   Mood and Affect  Mood:Anxious ("alright")  Affect:Flat   Thought Process  Thought Processes:Goal Directed  Descriptions of Associations:Loose  Orientation:Full (Time, Place and Person)  Thought Content:Paranoid Ideation (Patient denies paranoia in the hospital but states that others were playing mind games with her prior to admission)  History of Schizophrenia/Schizoaffective disorder:Yes  Duration of Psychotic Symptoms:Greater than six months  Hallucinations:No data recorded  Ideas of Reference:Paranoia  Suicidal Thoughts:No data recorded  Homicidal Thoughts:No data recorded   Sensorium  Memory:Immediate Fair; Recent Fair; Remote Fair  Judgment:Impaired  Insight:Poor   Executive Functions  Concentration:Fair  Attention Span:Fair  [place]  Language:Good   Psychomotor Activity  Psychomotor Activity:No data recorded   Assets  Assets:Desire for Improvement; Resilience   Sleep  Sleep:No data recorded    Physical Exam: Physical Exam Vitals and nursing note reviewed.  HENT:     Head: Normocephalic and atraumatic.  Eyes:     Extraocular Movements: Extraocular movements intact.  Cardiovascular:     Rate and Rhythm: Normal rate.  Pulmonary:     Effort: Pulmonary effort is normal.  Musculoskeletal:        General: Normal range of motion.  Neurological:     General: No focal deficit present.     Mental Status: She is alert and oriented to person, place, and time.    Review of Systems  Constitutional: Negative for  chills, diaphoresis and fever.  HENT: Negative for sore throat.   Respiratory: Negative for cough and shortness of breath.   Cardiovascular: Negative for chest pain and palpitations.  Gastrointestinal: Negative for nausea and vomiting.  Musculoskeletal: Negative.   Skin: Negative for rash.  Neurological: Negative for dizziness and tremors.  Psychiatric/Behavioral: Positive for agitation, decreased concentration, dysphoric mood and hallucinations. Negative for behavioral problems, confusion, self-injury, sleep disturbance and suicidal ideas. The patient is not nervous/anxious, does not have insomnia and is not hyperactive.        Patient denies hallucinations but appears to respond to internal stimuli at times   Blood pressure 111/82, pulse 98, temperature 97.8 F (36.6 C), temperature source Oral, resp. rate 16, height _0  (1.473 m), weight 42.2 kg, SpO2 97 %. Body mass index is 19.44 kg/m.   Treatment Plan Summary: Daily contact with patient to assess and evaluate symptoms and progress in treatment, Medication management and Plan:  Diagnosis: 1.  Schizophrenia; undifferentiated. 2.  Tachycardia improved 3.  Constipation improved.  Pertinent findings on examination today: 1.  Patient is essentially unchanged from yesterday.  She likely is at or near her baseline 2.  Patient is sleeping well 3.  Patient denies all symptoms.  Plan: 1.  Continue Cogentin 1 mg p.o. twice daily as needed tremors. 2.  Continue famotidine 20 mg p.o. twice daily for GERD. 3.  Continue haloperidol 10 mg p.o. twice daily and 15 mg p.o. nightly for psychosis and sleep. 4.  Continue Zyprexa Zydis agitation protocol as needed. 5.  Continue Zofran 4 mg p.o. every 8 hours as needed nausea and vomiting. 6.  Continue MiraLAX 17 g in 8 ounces of water p.o. daily for constipation. 7.  Continue Risperdal 2 mg p.o. daily and 6 mg p.o. nightly for psychosis, mood stability and sleep. 8.  Continue Senokot S1 tablet  p.o. nightly for constipation. 9.  Continue trazodone 150 mg p.o. nightly for insomnia.  10.  Disposition planning-patient continues to say that she has a condominium in her house in the [place] area, but we have been unable to confirm this.  She reports that she is followed by [facility] team.  Social work is investigating whether patient is currently connected with this ACT team.  Patient provides a phone number today for Balinda Quails with [facility]: [phone] to help with discharge planning. Suspect patient would have difficulty caring for herself independently.  This concern needs to be addressed prior to discharge.  She is on the [facility] wait list.  The patient has declined group home or ALF placement.  Lavella Hammock, MD 03/19/2021, 8:47 AM

## 2021-03-19 NOTE — Progress Notes (Signed)
Adult Psychoeducational Group Note  Date:  03/19/2021 Time:  2:24 AM  Group Topic/Focus:  Wrap-Up Group:   The focus of this group is to help patients review their daily goal of treatment and discuss progress on daily workbooks.  Participation Level:  Minimal  Participation Quality:  Appropriate  Affect:  Anxious  Cognitive:  Disorganized  Insight: Limited  Engagement in Group:  Limited  Modes of Intervention:  Discussion  Additional Comments:  Pt stated her goal for today was to focus on her treatment plan. Pt stated she accomplished her goal today. Pt stated she talk with her doctorand her social worker about her care today. Pt rated her overall day a 7out of 10. Pt stated she made no calls today.Pt stated she felt better about herself today. Pt stated she was able to attend all meals. Pt stated she attend all groups held today. Pt stated she took all medications provided today. Pt stated her appetite wasfairtoday. Pt rated sleep last night pretty good. Pt stated the goal tonight was to get some rest. Pt stated she had nophysical pain tonight.Pt denied visual hallucinations tonight and auditory issues tonight. Pt denies thoughts of harming herself or others. Pt stated she would alert staff if anything changed  [name] [name] 03/19/2021, 2:24 AM

## 2021-03-20 DIAGNOSIS — F203 Undifferentiated schizophrenia: Secondary | ICD-10-CM | POA: Diagnosis not present

## 2021-03-20 NOTE — Progress Notes (Signed)
   03/20/21 2110  COVID-19 Daily Checkoff  Have you had a fever (temp > 37.80C/100F)  in the past 24 hours?  No  If you have had runny nose, nasal congestion, sneezing in the past 24 hours, has it worsened? No  COVID-19 EXPOSURE  Have you traveled outside the state in the past 14 days? No  Have you been in contact with someone with a confirmed diagnosis of COVID-19 or PUI in the past 14 days without wearing appropriate PPE? No  Have you been living in the same home as a person with confirmed diagnosis of COVID-19 or a PUI (household contact)? No  Have you been diagnosed with COVID-19? No

## 2021-03-20 NOTE — [facility] Group Notes (Signed)
Adult Psychoeducational Group Not Date:  03/20/2021 Time:  0900-1045 Group Topic/Focus: PROGRESSIVE RELAXATION. A group where deep breathing is taught and tensing and relaxation muscle groups is used. Imagery is used as well.  Pts are asked to imagine 3 pillars that hold them up when they are not able to hold themselves up.  Participation Level:  Was in the group  Participation Quality:  Appropriate  Affect:  Appropriate  Cognitive:  whispering to herself  Insight: limited   Engagement in Group:  Listened, but did not follow the instructions of the progression relaxation,.  Modes of Intervention:  Activity, Discussion, Education, and Support  Additional Comments:    [facility]

## 2021-03-20 NOTE — Progress Notes (Signed)
[facility] MD Progress Note  03/20/2021 5:51 PM [name] [name]  MRN:  [id number]  Reason for admission:  Patient is a 45-year-old female with a past psychiatric history significant for paranoid schizophrenia who originally presented to the behavioral health urgent care center on 02/28/2021 with chief complaint that she was "having problems with God".   Objective: Medical record reviewed.  Patient's case discussed in detail with members of the treatment team.  Patient continues to be responding to internal stimuli, and is seen whispering to herself.    I met with and evaluated the patient today for follow-up on the unit.  Patient reports that she is doing okay, and is hoping for discharge soon.  She feels frustrated that care has not been able to be coordinated over the weekend due to her CST team being closed.  A voice message was left with [facility] team for Chanelle Lucas ([phone]) so that discharge planning can be coordinated to ensure that patient has services at discharge due to her recent high acuity in long-term psychiatric facility in [place].  Patient continues to be reluctant to continue on long-acting injectable antipsychotics. Patient states that she received a Haldol Decanoate injection on 03/02/2021.  She states that she would rather not get injections, but is agreeable to continuing p.o. medication.  Patient is on multiple antipsychotics for control of psychosis.  She has been taking medications while on the unit, and denies side effects.  She states that she is sleeping and eating well.  The patient denies AH, VH, PI, SI, HI, AI.  She denies any disturbing thoughts.  Patient is perseverative today on getting discharged.  She is intrusive with staff, providers, and social work in order to hurry her discharge.  She has been attending some groups where she is seen to be responding to herself.  Patient reports that her mood is good.   Nursing notes document that patient continues to respond to  internal stimuli, and document that she endorses auditory hallucinations to staff when questioned, however denies command auditory hallucinations.  She has been attending groups intermittently and at times participating.  She does not interact socially with peers. Patient has required prompting for ADLs.  She does not take initiative to get her own meals, and does not ambulate to the dining hall.  There are no new labs.    Principal Problem: Schizophrenia (HCC) Diagnosis: Principal Problem:   Schizophrenia (HCC)    Total Time Spent in Direct Patient Care:  I personally spent 25 minutes on the unit in direct patient care. The direct patient care time included face-to-face time with the patient, reviewing the patient's chart, communicating with other professionals, and coordinating care. Greater than 50% of this time was spent in counseling or coordinating care with the patient regarding goals of hospitalization, psycho-education, and discharge planning needs.   Past Psychiatric History: See admission H&P  Past Medical History:  Past Medical History:  Diagnosis Date  . Arthritis   . Depression   . Hallucinations   . Paranoid schizophrenia (HCC)   . Schizoaffective disorder (HCC)    History reviewed. No pertinent surgical history. Family History: History reviewed. No pertinent family history. Family Psychiatric  History: See admission H&P Social History:  Social History   Substance and Sexual Activity  Alcohol Use Not Currently   Comment: She denies      Social History   Substance and Sexual Activity  Drug Use Not Currently   Comment: She denies     Social   History   Socioeconomic History  . Marital status: Single    Spouse name: Not on file  . Number of children: Not on file  . Years of education: Not on file  . Highest education level: Not on file  Occupational History  . Occupation: Disability  Tobacco Use  . Smoking status: Current Every Day Smoker    Packs/day: 1.00     Types: Cigarettes  . Smokeless tobacco: Never Used  Vaping Use  . Vaping Use: Never used  Substance and Sexual Activity  . Alcohol use: Not Currently    Comment: She denies   . Drug use: Not Currently    Comment: She denies   . Sexual activity: Not Currently  Other Topics Concern  . Not on file  Social History Narrative   Pt stated that she lives in [place], and that she lives alone.  Pt receives outpatient psychiatric resources through [facility].   Social Determinants of Health   Financial Resource Strain: Not on file  Food Insecurity: Not on file  Transportation Needs: Not on file  Physical Activity: Not on file  Stress: Not on file  Social Connections: Not on file   Additional Social History:                         Sleep: Good  Appetite: Good  Current Medications: Current Facility-Administered Medications  Medication Dose Route Frequency Provider Last Rate Last Admin  . acetaminophen (TYLENOL) tablet 650 mg  650 mg Oral Q6H PRN Clary, Greg Lawson, MD   650 mg at 03/12/21 0532  . alum & mag hydroxide-simeth (MAALOX/MYLANTA) 200-200-20 MG/5ML suspension 30 mL  30 mL Oral Q4H PRN Clary, Greg Lawson, MD   30 mL at 03/12/21 1236  . benztropine (COGENTIN) tablet 1 mg  1 mg Oral BID PRN Clary, Greg Lawson, MD      . diphenhydrAMINE (BENADRYL) injection 25 mg  25 mg Intramuscular Q6H PRN James, Martha L, MD      . famotidine (PEPCID) tablet 20 mg  20 mg Oral BID Clary, Greg Lawson, MD   20 mg at 03/20/21 1711  . feeding supplement (ENSURE ENLIVE / ENSURE PLUS) liquid 237 mL  237 mL Oral BID BM Clary, Greg Lawson, MD   237 mL at 03/20/21 1424  . haloperidol (HALDOL) tablet 10 mg  10 mg Oral BID Clary, Greg Lawson, MD   10 mg at 03/20/21 1712  . haloperidol (HALDOL) tablet 15 mg  15 mg Oral QHS Clary, Greg Lawson, MD   15 mg at 03/19/21 2101  . magnesium hydroxide (MILK OF MAGNESIA) suspension 30 mL  30 mL Oral Daily PRN Clary, Greg Lawson, MD      .  OLANZapine zydis (ZYPREXA) disintegrating tablet 10 mg  10 mg Oral Q8H PRN Clary, Greg Lawson, MD   10 mg at 03/13/21 1639  . ondansetron (ZOFRAN-ODT) disintegrating tablet 4 mg  4 mg Oral Q8H PRN Clary, Greg Lawson, MD   4 mg at 03/12/21 1544  . polyethylene glycol (MIRALAX / GLYCOLAX) packet 17 g  17 g Oral Daily Clary, Greg Lawson, MD   17 g at 03/15/21 0810  . risperiDONE (RISPERDAL M-TABS) disintegrating tablet 2 mg  2 mg Oral Daily Clary, Greg Lawson, MD   2 mg at 03/20/21 0826  . risperiDONE (RISPERDAL M-TABS) disintegrating tablet 6 mg  6 mg Oral QHS Clary, Greg Lawson, MD   6 mg at 03/19/21 2102  . senna-docusate (  Senokot-S) tablet 1 tablet  1 tablet Oral QHS Clary, Greg Lawson, MD   1 tablet at 03/19/21 2101  . traZODone (DESYREL) tablet 150 mg  150 mg Oral QHS Clary, Greg Lawson, MD   150 mg at 03/19/21 2102    Lab Results:  No results found for this or any previous visit (from the past 48 hour(s)).  Blood Alcohol level:  Lab Results  Component Value Date   ETH <10 05/19/2020   ETH <10 05/11/2020    Metabolic Disorder Labs: Lab Results  Component Value Date   HGBA1C 5.1 02/28/2021   MPG 99.67 02/28/2021   MPG 96.8 05/19/2020   Lab Results  Component Value Date   PROLACTIN 28.9 (H) 06/26/2018   Lab Results  Component Value Date   CHOL 138 02/28/2021   TRIG 69 02/28/2021   HDL 50 02/28/2021   CHOLHDL 2.8 02/28/2021   VLDL 14 02/28/2021   LDLCALC 74 02/28/2021   LDLCALC 121 (H) 05/19/2020    Physical Findings: AIMS: Facial and Oral Movements Muscles of Facial Expression: None, normal Lips and Perioral Area: None, normal Jaw: None, normal Tongue: None, normal,Extremity Movements Upper (arms, wrists, hands, fingers): None, normal Lower (legs, knees, ankles, toes): None, normal, Trunk Movements Neck, shoulders, hips: None, normal, Overall Severity Severity of abnormal movements (highest score from questions above): None, normal Incapacitation due to abnormal  movements: None, normal Patient's awareness of abnormal movements (rate only patient's report): No Awareness, Dental Status Current problems with teeth and/or dentures?: No Does patient usually wear dentures?: No  CIWA:    COWS:     Musculoskeletal: Strength & Muscle Tone: within normal limits Gait & Station: normal Patient leans: N/A   Psychiatric Specialty Exam:  Presentation  General Appearance: Casual  Eye Contact:Fair  Speech:Garbled  Speech Volume:Decreased  Handedness:Right   Mood and Affect  Mood:Dysphoric  Affect:Constricted   Thought Process  Thought Processes:Goal Directed  Descriptions of Associations:Circumstantial  Orientation:Full (Time, Place and Person)  Thought Content:Paranoid Ideation; Perseveration (focused on discharge)  History of Schizophrenia/Schizoaffective disorder:Yes  Duration of Psychotic Symptoms:Greater than six months  Hallucinations:Hallucinations: Auditory  Ideas of Reference:Paranoia; Delusions  Suicidal Thoughts:Suicidal Thoughts: No  Homicidal Thoughts:Homicidal Thoughts: No   Sensorium  Memory:Immediate Fair; Recent Fair; Remote Fair  Judgment:Fair  Insight:Shallow   Executive Functions  Concentration:Fair  Attention Span:Fair  Recall:Fair  Fund of Knowledge:Fair  Language:Good   Psychomotor Activity  Psychomotor Activity:Psychomotor Activity: Decreased   Assets  Assets:Desire for Improvement; Resilience   Sleep  Sleep:Sleep: Good Number of Hours of Sleep: 8    Physical Exam: Physical Exam Vitals and nursing note reviewed.  HENT:     Head: Normocephalic and atraumatic.  Eyes:     Extraocular Movements: Extraocular movements intact.  Cardiovascular:     Rate and Rhythm: Normal rate.  Pulmonary:     Effort: Pulmonary effort is normal.  Musculoskeletal:        General: Normal range of motion.  Neurological:     General: No focal deficit present.     Mental Status: She is alert  and oriented to person, place, and time.    Review of Systems  Constitutional: Negative for chills, diaphoresis and fever.  HENT: Negative for sore throat.   Respiratory: Negative for cough and shortness of breath.   Cardiovascular: Negative for chest pain and palpitations.  Gastrointestinal: Negative for nausea and vomiting.  Musculoskeletal: Negative.   Skin: Negative for rash.  Neurological: Negative for dizziness and tremors.  Psychiatric/Behavioral: Positive   for dysphoric mood and hallucinations. Negative for agitation, behavioral problems, confusion, decreased concentration, self-injury, sleep disturbance and suicidal ideas. The patient is not nervous/anxious, does not have insomnia and is not hyperactive.        Patient denies hallucinations but appears to respond to internal stimuli at times   Blood pressure 117/80, pulse 96, temperature 97.9 F (36.6 C), temperature source Oral, resp. rate 16, height 4' 10" (1.473 m), weight 42.2 kg, SpO2 99 %. Body mass index is 19.44 kg/m.   Treatment Plan Summary: Daily contact with patient to assess and evaluate symptoms and progress in treatment, Medication management and Plan:  Diagnosis: 1.  Schizophrenia; undifferentiated. 2.  Tachycardia improved 3.  Constipation improved.  Pertinent findings on examination today: 1.  Patient is essentially unchanged from yesterday.  She likely is at or near her baseline, awaiting feedback from her CST team 2.  Patient is sleeping well 3.  Patient denies all symptoms.  Plan: 1.  Continue Cogentin 1 mg p.o. twice daily as needed tremors. 2.  Continue famotidine 20 mg p.o. twice daily for GERD. 3.  Continue haloperidol 10 mg p.o. twice daily and 15 mg p.o. nightly for psychosis and sleep. 4.  Continue Zyprexa Zydis agitation protocol as needed. 5.  Continue Zofran 4 mg p.o. every 8 hours as needed nausea and vomiting. 6.  Continue MiraLAX 17 g in 8 ounces of water p.o. daily for  constipation. 7.  Continue Risperdal 2 mg p.o. daily and 6 mg p.o. nightly for psychosis, mood stability and sleep. 8.  Continue Senokot S1 tablet p.o. nightly for constipation. 9.  Continue trazodone 150 mg p.o. nightly for insomnia.  10.  Disposition planning-patient continues to say that she has a condominium in her house in the [place] area, but we have been unable to confirm this.  She reports that she is followed by [place] team.  Social work is investigating whether patient is currently connected with this ACT team.  Patient provides a phone number today for Balinda Quails with [facility]: [phone] to help with discharge planning.  Patient has required prompting for ADLs.  She does not take initiative to get her own meals.  She will definitely need support from her outpatient team. Suspect patient would have difficulty caring for herself independently.  This concern needs to be addressed prior to discharge.  She is on the [facility] wait list.  The patient has declined group home or ALF placement.  Lavella Hammock, MD 03/20/2021, 5:51 PM

## 2021-03-20 NOTE — Progress Notes (Signed)
   03/19/21 2105  COVID-19 Daily Checkoff  Have you had a fever (temp > 37.80C/100F)  in the past 24 hours?  No  If you have had runny nose, nasal congestion, sneezing in the past 24 hours, has it worsened? No  COVID-19 EXPOSURE  Have you traveled outside the state in the past 14 days? No  Have you been in contact with someone with a confirmed diagnosis of COVID-19 or PUI in the past 14 days without wearing appropriate PPE? No  Have you been living in the same home as a person with confirmed diagnosis of COVID-19 or a PUI (household contact)? No  Have you been diagnosed with COVID-19? No

## 2021-03-20 NOTE — Progress Notes (Signed)
Pt did not attend orientation group.  

## 2021-03-21 MED ORDER — RISPERIDONE 2 MG PO TBDP: 2.0000 mg | ORAL_TABLET | Freq: Every day | ORAL | 0 refills | Status: DC

## 2021-03-21 MED ORDER — FAMOTIDINE 20 MG PO TABS: 20.0000 mg | ORAL_TABLET | Freq: Two times a day (BID) | ORAL | 0 refills | Status: AC

## 2021-03-21 MED ORDER — HALOPERIDOL 5 MG PO TABS: 15.0000 mg | ORAL_TABLET | Freq: Every day | ORAL | 0 refills | Status: DC

## 2021-03-21 MED ORDER — TRAZODONE HCL 150 MG PO TABS: 150.0000 mg | ORAL_TABLET | Freq: Every day | ORAL | 0 refills | Status: DC

## 2021-03-21 MED ORDER — RISPERIDONE 3 MG PO TBDP: 6.0000 mg | ORAL_TABLET | Freq: Every day | ORAL | 0 refills | Status: DC

## 2021-03-21 MED ORDER — HALOPERIDOL 10 MG PO TABS: 10.0000 mg | ORAL_TABLET | Freq: Two times a day (BID) | ORAL | 0 refills | Status: DC

## 2021-03-21 MED ORDER — BENZTROPINE MESYLATE 1 MG PO TABS: 1.0000 mg | ORAL_TABLET | Freq: Two times a day (BID) | ORAL | 0 refills | Status: DC | PRN

## 2021-03-21 MED ORDER — HALOPERIDOL DECANOATE 100 MG/ML IM SOLN: 100.0000 mg | INTRAMUSCULAR | Status: DC

## 2021-03-21 NOTE — Progress Notes (Signed)
Recreation Therapy Notes  INPATIENT RECREATION TR PLAN  Patient Details Name: [name] [name] MRN: [id number] DOB: 01/06/1976 Today's Date: 03/21/2021  Rec Therapy Plan Is patient appropriate for Therapeutic Recreation?: Yes Treatment times per week: about 3 days Estimated Length of Stay: 5-7 days TR Treatment/Interventions: Group participation (Comment)  Discharge Criteria Pt will be discharged from therapy if:: Discharged Treatment plan/goals/alternatives discussed and agreed upon by:: Patient/family  Discharge Summary Short term goals set: See patient care plan. Short term goals met: Complete Progress toward goals comments: Groups attended Which groups?: Self-esteem,Anger management,Communication,Coping skills,Leisure education (Anxiety) Reason goals not met: None Therapeutic equipment acquired: N/A Reason patient discharged from therapy: Discharge from hospital Pt/family agrees with progress & goals achieved: Yes Date patient discharged from therapy: 03/21/21    Victorino Sparrow, LRT/CTRS  Ria Comment, [place] 03/21/2021, 1:56 PM

## 2021-03-21 NOTE — Progress Notes (Signed)
Recreation Therapy Notes  Date: 5.16.22 Time: 1000 Location: 500 Hall Day Room  Group Topic: Goal Setting  Goal Area(s) Addresses:  Patient will identify the purpose of goals Patient will participate in discussion of what a goal is. Patient will successfully complete worksheet on setting goas  Intervention: Group Conversation, Worksheet  Activity: Patients had a group conversation about goals  And the purpose for setting goals.  Patients were to fill out a worksheet that focused on setting goals in six areas (family, friends, work/school, spirituality, body and mental health).  Patients were to share their top 3 categories with the group.  Patients were debriefed on the importance of setting goals and how goals can help move through everyday life.  Education:Following directions, Education on Goal Setting  Education Outcome: Acknowledges education  Clinical Observations/Feedback: Patient did not attend group session.   [name], LRT/CTRS         [name] A 03/21/2021 11:47 AM

## 2021-03-21 NOTE — Progress Notes (Signed)
Pt did not attend orientation group.  

## 2021-03-21 NOTE — Tx Team (Signed)
Interdisciplinary Treatment and Diagnostic Plan Update  03/21/2021 Time of Session: 10am [name] [name] MRN: [id number]  Principal Diagnosis: Schizophrenia [facility])  Secondary Diagnoses: Principal Problem:   Schizophrenia (HCC)   Current Medications:  Current Facility-Administered Medications  Medication Dose Route Frequency Provider Last Rate Last Admin  . acetaminophen (TYLENOL) tablet 650 mg  650 mg Oral Q6H PRN Antonieta Pert, MD   650 mg at 03/12/21 0532  . alum & mag hydroxide-simeth (MAALOX/MYLANTA) 200-200-20 MG/5ML suspension 30 mL  30 mL Oral Q4H PRN Antonieta Pert, MD   30 mL at 03/12/21 1236  . benztropine (COGENTIN) tablet 1 mg  1 mg Oral BID PRN Antonieta Pert, MD      . diphenhydrAMINE (BENADRYL) injection 25 mg  25 mg Intramuscular Q6H PRN Claudie Revering, MD      . famotidine (PEPCID) tablet 20 mg  20 mg Oral BID Antonieta Pert, MD   20 mg at 03/21/21 0942  . feeding supplement (ENSURE ENLIVE / ENSURE PLUS) liquid 237 mL  237 mL Oral BID BM Antonieta Pert, MD   237 mL at 03/21/21 0943  . haloperidol (HALDOL) tablet 10 mg  10 mg Oral BID Antonieta Pert, MD   10 mg at 03/21/21 0942  . haloperidol (HALDOL) tablet 15 mg  15 mg Oral QHS Antonieta Pert, MD   15 mg at 03/20/21 2109  . magnesium hydroxide (MILK OF MAGNESIA) suspension 30 mL  30 mL Oral Daily PRN Antonieta Pert, MD      . OLANZapine zydis (ZYPREXA) disintegrating tablet 10 mg  10 mg Oral Q8H PRN Antonieta Pert, MD   10 mg at 03/13/21 1639  . ondansetron (ZOFRAN-ODT) disintegrating tablet 4 mg  4 mg Oral Q8H PRN Antonieta Pert, MD   4 mg at 03/12/21 1544  . polyethylene glycol (MIRALAX / GLYCOLAX) packet 17 g  17 g Oral Daily Antonieta Pert, MD   17 g at 03/15/21 0810  . risperiDONE (RISPERDAL M-TABS) disintegrating tablet 2 mg  2 mg Oral Daily Antonieta Pert, MD   2 mg at 03/21/21 0942  . risperiDONE (RISPERDAL M-TABS) disintegrating tablet 6 mg  6 mg Oral QHS Antonieta Pert, MD   6 mg at 03/20/21 2109  . senna-docusate (Senokot-S) tablet 1 tablet  1 tablet Oral QHS Antonieta Pert, MD   1 tablet at 03/20/21 2109  . traZODone (DESYREL) tablet 150 mg  150 mg Oral QHS Antonieta Pert, MD   150 mg at 03/20/21 2109   PTA Medications: Medications Prior to Admission  Medication Sig Dispense Refill Last Dose  . benztropine (COGENTIN) 1 MG tablet Take 1 tablet (1 mg total) by mouth 2 (two) times daily. 60 tablet 0   . chlorproMAZINE (THORAZINE) 50 MG tablet Take 1 tablet by mouth in the morning and at bedtime.     . famotidine (PEPCID) 20 MG tablet Take 20 mg by mouth 2 (two) times daily as needed.     . haloperidol (HALDOL) 5 MG tablet Take 1 tablet (5 mg total) by mouth 2 (two) times daily.     . haloperidol decanoate (HALDOL DECANOATE) 100 MG/ML injection Inject 100 mg into the muscle every 28 (twenty-eight) days.     . vitamin C (ASCORBIC ACID) 500 MG tablet Take 500 mg by mouth daily.     . Vitamin D, Cholecalciferol, 25 MCG (1000 UT) TABS Take 1 tablet by mouth daily.  Patient Stressors: Health problems  Patient Strengths: Motivation for treatment/growth Supportive family/friends  Treatment Modalities: Medication Management, Group therapy, Case management,  1 to 1 session with clinician, Psychoeducation, Recreational therapy.   Physician Treatment Plan for Primary Diagnosis: Schizophrenia (HCC) Long Term Goal(s): Improvement in symptoms so as ready for discharge Improvement in symptoms so as ready for discharge   Short Term Goals: Ability to identify changes in lifestyle to reduce recurrence of condition will improve Ability to verbalize feelings will improve Ability to disclose and discuss suicidal ideas Ability to demonstrate self-control will improve Ability to identify and develop effective coping behaviors will improve Ability to maintain clinical measurements within normal limits will improve Compliance with prescribed medications  will improve Ability to identify changes in lifestyle to reduce recurrence of condition will improve Ability to verbalize feelings will improve Ability to disclose and discuss suicidal ideas Ability to demonstrate self-control will improve Ability to identify and develop effective coping behaviors will improve Ability to maintain clinical measurements within normal limits will improve Compliance with prescribed medications will improve  Medication Management: Evaluate patient's response, side effects, and tolerance of medication regimen.  Therapeutic Interventions: 1 to 1 sessions, Unit Group sessions and Medication administration.  Evaluation of Outcomes: Adequate for Discharge  Physician Treatment Plan for Secondary Diagnosis: Principal Problem:   Schizophrenia (HCC)  Long Term Goal(s): Improvement in symptoms so as ready for discharge Improvement in symptoms so as ready for discharge   Short Term Goals: Ability to identify changes in lifestyle to reduce recurrence of condition will improve Ability to verbalize feelings will improve Ability to disclose and discuss suicidal ideas Ability to demonstrate self-control will improve Ability to identify and develop effective coping behaviors will improve Ability to maintain clinical measurements within normal limits will improve Compliance with prescribed medications will improve Ability to identify changes in lifestyle to reduce recurrence of condition will improve Ability to verbalize feelings will improve Ability to disclose and discuss suicidal ideas Ability to demonstrate self-control will improve Ability to identify and develop effective coping behaviors will improve Ability to maintain clinical measurements within normal limits will improve Compliance with prescribed medications will improve     Medication Management: Evaluate patient's response, side effects, and tolerance of medication regimen.  Therapeutic Interventions: 1  to 1 sessions, Unit Group sessions and Medication administration.  Evaluation of Outcomes: Adequate for Discharge   RN Treatment Plan for Primary Diagnosis: Schizophrenia (HCC) Long Term Goal(s): Knowledge of disease and therapeutic regimen to maintain health will improve  Short Term Goals: Ability to demonstrate self-control, Ability to participate in decision making will improve and Ability to verbalize feelings will improve  Medication Management: RN will administer medications as ordered by provider, will assess and evaluate patient's response and provide education to patient for prescribed medication. RN will report any adverse and/or side effects to prescribing provider.  Therapeutic Interventions: 1 on 1 counseling sessions, Psychoeducation, Medication administration, Evaluate responses to treatment, Monitor vital signs and CBGs as ordered, Perform/monitor CIWA, COWS, AIMS and Fall Risk screenings as ordered, Perform wound care treatments as ordered.  Evaluation of Outcomes: Adequate for Discharge   LCSW Treatment Plan for Primary Diagnosis: Schizophrenia [facility]) Long Term Goal(s): Safe transition to appropriate next level of care at discharge, Engage patient in therapeutic group addressing interpersonal concerns.  Short Term Goals: Engage patient in aftercare planning with referrals and resources, Increase social support and Increase ability to appropriately verbalize feelings  Therapeutic Interventions: Assess for all discharge needs, 1 to 1 time  with Social worker, Explore available resources and support systems, Assess for adequacy in community support network, Educate family and significant other(s) on suicide prevention, Complete Psychosocial Assessment, Interpersonal group therapy.  Evaluation of Outcomes: Adequate for Discharge   Progress in Treatment: Attending groups: Yes. Participating in groups: Yes. Taking medication as prescribed: Yes. Toleration medication:  Yes. Family/Significant other contact made: Yes, individual(s) contacted:  pt declined consents Patient understands diagnosis: Yes. Discussing patient identified problems/goals with staff: Yes. Medical problems stabilized or resolved: Yes. Denies suicidal/homicidal ideation: Yes. Issues/concerns per patient self-inventory: No. Other: None  New problem(s) identified: No, Describe:  None  New Short Term/Long Term Goal(s):medication stabilization, elimination of SI thoughts, development of comprehensive mental wellness plan.  Patient Goals:  "take my medications."  Discharge Plan or Barriers: Patient is to follow up with CST team and is to return to stay in her apartment. Pt has been referred for ACTT services.   Reason for Continuation of Hospitalization: Medication stabilization  Estimated Length of Stay: 3-5 days  Attendees: Patient: 03/11/2021   Physician:  03/11/2021   Nursing:  03/11/2021   RN Care Manager: 03/11/2021   Social Worker: Ruthann Cancer, LCSW 03/11/2021   Recreational Therapist:  03/11/2021   Other:  03/11/2021   Other:  03/11/2021   Other: 03/11/2021       Scribe for Treatment Team: Otelia Santee, LCSW 03/21/2021 10:54 AM

## 2021-03-21 NOTE — Progress Notes (Signed)
Pt discharged to lobby. Pt was stable and appreciative at that time. All papers and prescriptions were given and valuables returned. Verbal understanding expressed. Denies SI/HI and A/VH. Pt given opportunity to express concerns and ask questions.  

## 2021-03-21 NOTE — Progress Notes (Signed)
Adult Psychoeducational Group Note  Date:  03/21/2021 Time:  12:36 AM  Group Topic/Focus:  Wrap-Up Group:   The focus of this group is to help patients review their daily goal of treatment and discuss progress on daily workbooks.  Participation Level:  Minimal  Participation Quality:  Appropriate  Affect:  Anxious and Flat  Cognitive:  Disorganized  Insight: Appropriate  Engagement in Group:  Limited  Modes of Intervention:  Discussion  Additional Comments:  Pt stated her goal for today was to focus on her treatment plan. Pt stated she accomplished her goal today. Pt stated she talk with her doctorbut did not get a chance to talk with her social worker about her care today. Pt rated her overall day a7out of 10. Pt stated she made no calls today.Pt stated she felt better about herself today. Pt stated she was able to attend all meals. Pt stated she attend all groups held today. Pt stated she took all medications provided today. Pt stated her appetite wasfairtoday. Pt rated sleep last nightpretty good. Pt stated the goal tonight was to get some rest. Pt stated she had nophysical pain tonight.Pt denied visual hallucinations tonight and auditory issues tonight. Pt denies thoughts of harming herself or others. Pt stated she would alert staff if anything   Felipa Furnace 03/21/2021, 12:36 AM

## 2021-03-21 NOTE — Discharge Summary (Signed)
Physician Discharge Summary Note  Patient:  [name] [name] is an 45 y.o., female MRN:  [id number] DOB:  [birth year] Patient phone:  [phone] (home)  Patient address:   [address] [place] [address],  Total Time spent with patient: 30 minutes  Date of Admission:  03/09/2021 Date of Discharge: 03/21/2021  Reason for Admission:  (From MD's admission note): Patient is a 45 year old female with a past psychiatric history significant for paranoid schizophrenia who originally presented to the behavioral health urgent care center on 02/28/2021 with chief complaint that she was "having problems with God". She stated at that time she felt as though [name] [name] was going to kill her. She was apparently hospitalized from August 2021 until April or May 2022 at a facility in [place]. She apparently has been in [place] for approximately 2 weeks. Per her chart review it was shown that the patient was discharged on Cogentin, Thorazine, Invega, Risperdal, Zoloft and trazodone. The patient was unable to recall any of these medicines. On examination today she still paranoid and delusional and believes that [name] [name] is going to kill her. She was apparently then transferred to the behavioral health urgent care center. It appears that she was discharged from the [facility] on 03/02/2021. She then presented to the [facility] emergency department on 03/07/2021. She reported abdominal pain and nausea and vomiting. She had the same complaints at that time that she did earlier at the behavioral health urgent care center. She was transferred to our facility on 03/09/2021.  Evaluation on the unit: Patient was seen and evaluated, chart reviewed. Patient denies SI/HI/AVH, paranoia and delusions. She has told staff that she hears non-command voices and has been observed responding to internal stimuli. This appears to baseline for this patient. She has been  intermittently attending group. She is intrusive at times. She had a Haldol Dec injection on 4/27. He rnext injection is due 03/30/21. She has been instructed to follow up with [facility] for CST services. She has an appointment at [facility] on 5/20 for therapy and 6/1 for medication management. This information has been placed in the discharge paperwork as well. Patient has been taking her medications and has had no issues with them. She will return home today after discharge. Her VS are stable. She is afebrile. Patient's medications were sent to the pharmacy on file in her chart. Patient is stable for discharge home today.   Principal Problem: Schizophrenia [facility]) Discharge Diagnoses: Principal Problem:   Schizophrenia [facility])   Past Psychiatric History: See H&P  Past Medical History:  Past Medical History:  Diagnosis Date  . Arthritis   . Depression   . Hallucinations   . Paranoid schizophrenia (HCC)   . Schizoaffective disorder (HCC)    History reviewed. No pertinent surgical history. Family History: History reviewed. No pertinent family history. Family Psychiatric  History: See H&P Social History:  Social History   Substance and Sexual Activity  Alcohol Use Not Currently   Comment: She denies      Social History   Substance and Sexual Activity  Drug Use Not Currently   Comment: She denies     Social History   Socioeconomic History  . Marital status: Single    Spouse name: Not on file  . Number of children: Not on file  . Years of education: Not on file  . Highest education level: Not on file  Occupational History  . Occupation: Disability  Tobacco Use  . Smoking status: Current Every Day Smoker    Packs/day: 1.00    Types: Cigarettes  . Smokeless tobacco: Never Used  Vaping Use  . Vaping Use: Never used  Substance and Sexual Activity  . Alcohol use: Not Currently    Comment: She denies   . Drug use: Not Currently    Comment:  She denies   . Sexual activity: Not Currently  Other Topics Concern  . Not on file  Social History Narrative   Pt stated that she lives in [place], and that she lives alone.  Pt receives outpatient psychiatric resources through [employer].   Social Determinants of Health   Financial Resource Strain: Not on file  Food Insecurity: Not on file  Transportation Needs: Not on file  Physical Activity: Not on file  Stress: Not on file  Social Connections: Not on file    Hospital Course:  After the above admission evaluation, [name] [name]'s presenting symptoms were noted. She was recommended for mood stabilization treatments. The medication regimen targeting those presenting symptoms were discussed with her & initiated with her consent. She was placed on Haldol and Risperdal for her psychosis and Trazodone for sleep. She has not needed any Zyprexa PRN for agitiation since 5/8. Her UDS on arrival to the ED was negative.  She was however medicated, stabilized & discharged on the medications as listed on her discharge medication list below. Besides the mood stabilization treatments, [name] [name] was also enrolled & intermittently participated in the group counseling sessions being offered & held on this unit. She learned some coping skills. She presented no other significant pre-existing medical issues that required treatment. She tolerated his treatment regimen without any adverse effects or reactions reported.   During the course of her hospitalization, the 15-minute checks were adequate to ensure patient's safety. [name] [name] did not display any dangerous, violent or suicidal behavior on the unit.  She interacted with patients & staff appropriately, participated appropriately in the group sessions/therapies. Her medications were addressed & adjusted to meet her needs. She was recommended for outpatient follow-up care & medication management upon discharge to assure continuity of care & mood stability.  At the time  of discharge patient is not reporting any acute suicidal/homicidal ideations. She feels more confident about her self-care & in managing his mental health. She currently denies any new issues or concerns. Education and supportive counseling provided throughout his/her hospital stay & upon discharge.   Today upon her discharge evaluation with the attending psychiatrist, [name] [name] shares she is doing well. She denies any other specific concerns. She is sleeping well. Her appetite is good. She denies other physical complaints. She denies AH/VH, delusional thoughts or paranoia. She does report non-command auditory hallucinations to staff when questioned and has been observed responding to internal stimuli. This appears to be baseline for this patient and she is well controlled on her current medication regimen. She feels that her medications have been helpful & is in agreement to continue her current treatment regimen as recommended. She was able to engage in safety planning including plan to return to [facility] or contact emergency services if she feels unable to maintain her own safety or the safety of others. Pt had no further questions, comments, or concerns. She left [facility] with all personal belongings in no apparent distress. Transportation home per [employer].   Physical Findings: AIMS: Facial and Oral Movements Muscles of Facial Expression: None, normal Lips and Perioral Area: None, normal Jaw: None, normal Tongue: None, normal,Extremity  Movements Upper (arms, wrists, hands, fingers): None, normal Lower (legs, knees, ankles, toes): None, normal, Trunk Movements Neck, shoulders, hips: None, normal, Overall Severity Severity of abnormal movements (highest score from questions above): None, normal Incapacitation due to abnormal movements: None, normal Patient's awareness of abnormal movements (rate only patient's report): No Awareness, Dental Status Current problems with teeth and/or dentures?:  No Does patient usually wear dentures?: No  CIWA:    COWS:     Musculoskeletal: Strength & Muscle Tone: within normal limits Gait & Station: normal Patient leans: N/A  Psychiatric Specialty Exam:  Presentation  General Appearance: Appropriate for Environment; Casual  Eye Contact:Good  Speech:Clear and Coherent; Normal Rate  Speech Volume:Normal  Handedness:Right  Mood and Affect  Mood:Euthymic  Affect:Appropriate; Congruent  Thought Process  Thought Processes:Coherent; Goal Directed  Descriptions of Associations:Circumstantial  Orientation:Full (Time, Place and Person)  Thought Content:Paranoid Ideation; Other (comment) (focused on discharge)  History of Schizophrenia/Schizoaffective disorder:Yes  Duration of Psychotic Symptoms:Greater than six months  Hallucinations:Hallucinations: Auditory  Ideas of Reference:Paranoia  Suicidal Thoughts:Suicidal Thoughts: No  Homicidal Thoughts:Homicidal Thoughts: No  Sensorium  Memory:Immediate Fair; Recent Fair; Remote Fair  Judgment:Fair  Insight:Shallow  Executive Functions  Concentration:Fair  Attention Span:Fair  Recall:Fair  Fund of Knowledge:Fair  Language:Good  Psychomotor Activity  Psychomotor Activity:Psychomotor Activity: Decreased  Assets  Assets:Desire for Improvement; Housing; Resilience; Other (comment) (CST team)  Sleep  Sleep:Sleep: Fair Number of Hours of Sleep: 5.5  Physical Exam: Physical Exam Vitals and nursing note reviewed.  Constitutional:      Appearance: Normal appearance.  HENT:     Head: Normocephalic.  Pulmonary:     Effort: Pulmonary effort is normal.  Musculoskeletal:        General: Normal range of motion.     Cervical back: Normal range of motion.  Skin:    Findings: Erythema present.  Neurological:     Mental Status: She is alert and oriented to person, place, and time.  Psychiatric:        Attention and Perception: Attention normal. She does not  perceive auditory or visual hallucinations.        Mood and Affect: Mood normal.        Speech: Speech normal.        Behavior: Behavior normal. Behavior is cooperative.        Thought Content: Thought content normal. Thought content is not paranoid or delusional. Thought content does not include homicidal or suicidal ideation. Thought content does not include homicidal or suicidal plan.        Cognition and Memory: Cognition normal.    Review of Systems  Constitutional: Negative for fever.  HENT: Negative.  Negative for congestion, sinus pain and sore throat.   Respiratory: Negative.  Negative for cough, shortness of breath and wheezing.   Cardiovascular: Negative.  Negative for chest pain.  Gastrointestinal: Negative.   Genitourinary: Negative.   Musculoskeletal: Negative.   Neurological: Negative.    Blood pressure 119/82, pulse 94, temperature 97.9 F (36.6 C), temperature source Oral, resp. rate 16, height 4\' 10"  (1.473 m), weight 42.2 kg, SpO2 99 %. Body mass index is 19.44 kg/m.   Have you used any form of tobacco in the last 30 days? (Cigarettes, Smokeless Tobacco, Cigars, and/or Pipes): Yes  Has this patient used any form of tobacco in the last 30 days? (Cigarettes, Smokeless Tobacco, Cigars, and/or Pipes) Yes, N/A  Blood Alcohol level:  Lab Results  Component Value Date   ETH <10 05/19/2020  ETH <10 05/11/2020    Metabolic Disorder Labs:  Lab Results  Component Value Date   HGBA1C 5.1 02/28/2021   MPG 99.67 02/28/2021   MPG 96.8 05/19/2020   Lab Results  Component Value Date   PROLACTIN 28.9 (H) 06/26/2018   Lab Results  Component Value Date   CHOL 138 02/28/2021   TRIG 69 02/28/2021   HDL 50 02/28/2021   CHOLHDL 2.8 02/28/2021   VLDL 14 02/28/2021   LDLCALC 74 02/28/2021   LDLCALC 121 (H) 05/19/2020    See Psychiatric Specialty Exam and Suicide Risk Assessment completed by Attending Physician prior to discharge.  Discharge destination:  Home  Is  patient on multiple antipsychotic therapies at discharge:  Yes,   Do you recommend tapering to monotherapy for antipsychotics?  No   Has Patient had three or more failed trials of antipsychotic monotherapy by history:  Yes,   Antipsychotic medications that previously failed include:   1.  Thorazine. and 2.  Invega.  Recommended Plan for Multiple Antipsychotic Therapies: Additional reason(s) for multiple antispychotic treatment:  Patient requires 2 antipsychotics for stable mood.   Discharge Instructions    Diet - low sodium heart healthy   Complete by: As directed    Increase activity slowly   Complete by: As directed      Allergies as of 03/21/2021   No Known Allergies     Medication List    STOP taking these medications   chlorproMAZINE 50 MG tablet Commonly known as: THORAZINE     TAKE these medications     Indication  benztropine 1 MG tablet Commonly known as: COGENTIN Take 1 tablet (1 mg total) by mouth 2 (two) times daily as needed for tremors. What changed:   when to take this  reasons to take this  Indication: Extrapyramidal Reaction caused by Medications   famotidine 20 MG tablet Commonly known as: PEPCID Take 1 tablet (20 mg total) by mouth 2 (two) times daily. What changed:   when to take this  reasons to take this  Indication: Gastroesophageal Reflux Disease, Heartburn   haloperidol 10 MG tablet Commonly known as: HALDOL Take 1 tablet (10 mg total) by mouth 2 (two) times daily. What changed:   medication strength  how much to take  Indication: Psychosis   haloperidol 5 MG tablet Commonly known as: HALDOL Take 3 tablets (15 mg total) by mouth at bedtime. What changed: You were already taking a medication with the same name, and this prescription was added. Make sure you understand how and when to take each.  Indication: Psychosis   haloperidol decanoate 100 MG/ML injection Commonly known as: HALDOL DECANOATE Inject 1 mL (100 mg total) into  the muscle every 28 (twenty-eight) days. Given 03/02/21, next dose due 03/30/2021. Start taking on: Mar 30, 2021 What changed: additional instructions  Indication: Schizophrenia   risperidone 3 MG disintegrating tablet Commonly known as: RISPERDAL M-TABS Take 2 tablets (6 mg total) by mouth at bedtime.  Indication: Schizophrenia   risperiDONE 2 MG disintegrating tablet Commonly known as: RISPERDAL M-TABS Take 1 tablet (2 mg total) by mouth daily. Start taking on: Mar 22, 2021  Indication: Schizophrenia   traZODone 150 MG tablet Commonly known as: DESYREL Take 1 tablet (150 mg total) by mouth at bedtime.  Indication: Trouble Sleeping   vitamin C 500 MG tablet Commonly known as: ASCORBIC ACID Take 500 mg by mouth daily.  Indication: Inadequate Vitamin C   Vitamin D (Cholecalciferol) 25 MCG (1000 UT) Tabs  Take 1 tablet by mouth daily.  Indication: Osteoporosis       Follow-up Information    [facility] Follow up.   Why: Please follow up with your provider for CST services Contact information: [address] [place], [place], [address] [place]  Phone: [phone]       [facility]. Go on 03/18/2021.   Specialty: Behavioral Health Why: You have an appointment for therapy services on 03/25/21 at 12:00 pm.  You also have an appointment for medication management services on 04/06/21 at 7:45 am.  These appointments will be held in person and are first come, first served. Contact information: [address] [place] [address] [phone]       [facility] Follow up.   Why: An ACTT referral has been made on your behalf. Please call at discharge to schedule intake.  Contact information: [address] [place] [address] [phone]               Follow-up recommendations:  Activity:  as tolerated Diet:  Heart healthy  Comments: Prescriptions given at discharge.  Patient is  agreeable to plan.  She was given the opportunity to ask questions.  She appears to feel comfortable with discharge and denies any current suicidal or homicidal thoughts.   Patient is instructed prior to discharge to: Take all medications as prescribed by her mental healthcare provider. Report any adverse effects and or reactions from the medicines to her outpatient provider promptly. Patient has been instructed & cautioned: To not engage in alcohol and or illegal drug use while on prescription medicines. In the event of worsening symptoms, patient is instructed to call the crisis hotline, 911 and or go to the nearest ED for appropriate evaluation and treatment of symptoms. To follow-up with her primary care provider for your other medical issues, concerns and or health care needs.   Signed: Laveda Abbe, NP 03/21/2021, 2:10 PM

## 2021-03-21 NOTE — Progress Notes (Signed)
Patient reports that she has been in [place] recently with relatives but it didn't work out so she returned here to [place]. She isolates in her room and writer encouraged her to come to dayroom for a while which she did briefly. Compliant with her medications, concerned when she will discharge.

## 2021-03-21 NOTE — Progress Notes (Signed)
  [facility] Adult Case Management Discharge Plan :  Will you be returning to the same living situation after discharge:  Yes,  to home At discharge, do you have transportation home?: No. Safe Transport to be arranged. Do you have the ability to pay for your medications: Yes,  has insurance  Release of information consent forms completed and in the chart;  Patient's signature needed at discharge.  Patient to Follow up at:  Follow-up Information    [facility] Follow up.   Why: Please follow up with your provider for CST services Contact information: [address] [place], [place], [address] [place]  Phone: [phone]       [facility]. Go on 03/18/2021.   Specialty: Behavioral Health Why: You have an appointment for therapy services on 03/25/21 at 12:00 pm.  You also have an appointment for medication management services on 04/06/21 at 7:45 am.  These appointments will be held in person and are first come, first served. Contact information: [address] [place] [address] [phone]       [facility] Follow up.   Why: An ACTT referral has been made on your behalf. Please call at discharge to schedule intake.  Contact information: [address] [place] [address] [phone]               Next level of care provider has access to [facility] Link:no  Safety Planning and Suicide Prevention discussed: Yes,  with patient  Have you used any form of tobacco in the last 30 days? (Cigarettes, Smokeless Tobacco, Cigars, and/or Pipes): Yes  Has patient been referred to the Quitline?: Patient refused referral  Patient has been referred for addiction treatment: Pt. refused referral  Otelia Santee, LCSW 03/21/2021, 9:59 AM

## 2021-03-21 NOTE — Plan of Care (Signed)
Patient was able to identify triggers to anxiety at completion of recreation therapy group sessions.    [name], LRT/CTRS

## 2021-03-21 NOTE — BHH Suicide Risk Assessment (Signed)
[facility] Discharge Suicide Risk Assessment   Principal Problem: Schizophrenia [facility]) Discharge Diagnoses: Principal Problem:   Schizophrenia (HCC)   Total Time spent with patient: 15 minutes  Musculoskeletal: Strength & Muscle Tone: within normal limits Gait & Station: normal Patient leans: N/A  Psychiatric Specialty Exam  Presentation  General Appearance: Casual  Eye Contact:Fair  Speech:Normal Rate  Speech Volume:Decreased  Handedness:Right   Mood and Affect  Mood:Anxious  Duration of Depression Symptoms: Greater than two weeks  Affect:Constricted   Thought Process  Thought Processes:Coherent; Goal Directed  Descriptions of Associations:Circumstantial  Orientation:Full (Time, Place and Person)  Thought Content:Paranoid Ideation; Other (comment) (focused on discharge)  History of Schizophrenia/Schizoaffective disorder:Yes  Duration of Psychotic Symptoms:Greater than six months  Hallucinations:Hallucinations: Auditory  Ideas of Reference:Paranoia  Suicidal Thoughts:Suicidal Thoughts: No  Homicidal Thoughts:Homicidal Thoughts: No   Sensorium  Memory:Immediate Fair; Recent Fair; Remote Fair  Judgment:Fair  Insight:Shallow   Executive Functions  Concentration:Fair  Attention Span:Fair  Recall:Fair  Fund of Knowledge:Fair  Language:Good   Psychomotor Activity  Psychomotor Activity:Psychomotor Activity: Decreased   Assets  Assets:Desire for Improvement; Housing; Resilience; Other (comment) (CST team)   Sleep  Sleep:Sleep: Fair Number of Hours of Sleep: 5.5   Physical Exam: Physical Exam Vitals and nursing note reviewed.  HENT:     Head: Normocephalic and atraumatic.  Pulmonary:     Effort: Pulmonary effort is normal.  Neurological:     General: No focal deficit present.     Mental Status: She is alert and oriented to person, place, and time.    ROS Blood pressure 119/82, pulse 94, temperature 97.9 F (36.6 C), temperature  source Oral, resp. rate 16, height 4\' 10"  (1.473 m), weight 42.2 kg, SpO2 99 %. Body mass index is 19.44 kg/m.  Mental Status Per Nursing Assessment::   On Admission:  NA  Demographic Factors:  Low socioeconomic status, Living alone and Unemployed  Loss Factors: NA  Historical Factors: Impulsivity  Risk Reduction Factors:   Sense of responsibility to family, Religious beliefs about death and Has supportive CST program and day program  Continued Clinical Symptoms:  Schizophrenia:   Depressive state Paranoid or undifferentiated type Previous Psychiatric Diagnoses and Treatments  Cognitive Features That Contribute To Risk:  Thought constriction (tunnel vision)    Suicide Risk:  Minimal: No identifiable suicidal ideation.  Patients presenting with no risk factors but with morbid ruminations; may be classified as minimal risk based on the severity of the depressive symptoms   Follow-up Information    [facility] Follow up.   Why: Please follow up with your provider for CST services Contact information: [address] [place], [place], [place] [address]  Phone: [phone]       [facility]. Go on 03/18/2021.   Specialty: Behavioral Health Why: You have an appointment for therapy services on 03/25/21 at 12:00 pm.  You also have an appointment for medication management services on 04/06/21 at 7:45 am.  These appointments will be held in person and are first come, first served. Contact information: [address] [place] [phone]       [facility] Follow up.   Why: An ACTT referral has been made on your behalf. Please call at discharge to schedule intake.  Contact information: [address] [place] [phone]               Plan Of Care/Follow-up recommendations:  Activity:  As tolerated.  Other:   -Take medications as prescribed.   -Do not drink  alcohol.  Do not use marijuana or other drugs.   -Keep outpatient mental health follow-up appointments with [facility] CST Team psychiatrist and therapist.   -Attend day program.   -See your primary care provider regarding treatment of any medical conditions.  Claudie Revering, MD 03/21/2021, 12:25 PM

## 2021-03-28 DIAGNOSIS — J029 Acute pharyngitis, unspecified: Secondary | ICD-10-CM | POA: Diagnosis not present

## 2021-03-28 DIAGNOSIS — R059 Cough, unspecified: Secondary | ICD-10-CM | POA: Diagnosis not present

## 2021-03-28 DIAGNOSIS — Z20822 Contact with and (suspected) exposure to covid-19: Secondary | ICD-10-CM | POA: Diagnosis not present

## 2021-03-29 DIAGNOSIS — R5383 Other fatigue: Secondary | ICD-10-CM | POA: Diagnosis not present

## 2021-03-29 DIAGNOSIS — R531 Weakness: Secondary | ICD-10-CM | POA: Diagnosis not present

## 2021-03-29 DIAGNOSIS — F209 Schizophrenia, unspecified: Secondary | ICD-10-CM | POA: Diagnosis not present

## 2021-03-29 DIAGNOSIS — F99 Mental disorder, not otherwise specified: Secondary | ICD-10-CM | POA: Diagnosis not present

## 2021-04-10 ENCOUNTER — Emergency Department (HOSPITAL_COMMUNITY)
Admission: EM | Admit: 2021-04-10 | Discharge: 2021-04-10 | Discharge status: Against Medical Advice | Payer: Medicare Other | Attending: Emergency Medicine | Admitting: Emergency Medicine

## 2021-04-10 ENCOUNTER — Encounter (HOSPITAL_COMMUNITY): Payer: Self-pay | Admitting: Emergency Medicine

## 2021-04-10 DIAGNOSIS — Z5321 Procedure and treatment not carried out due to patient leaving prior to being seen by health care provider: Secondary | ICD-10-CM | POA: Diagnosis not present

## 2021-04-10 DIAGNOSIS — M79661 Pain in right lower leg: Secondary | ICD-10-CM | POA: Insufficient documentation

## 2021-04-10 DIAGNOSIS — R5381 Other malaise: Secondary | ICD-10-CM | POA: Diagnosis not present

## 2021-04-10 DIAGNOSIS — M79602 Pain in left arm: Secondary | ICD-10-CM | POA: Insufficient documentation

## 2021-04-10 DIAGNOSIS — M79606 Pain in leg, unspecified: Secondary | ICD-10-CM | POA: Diagnosis not present

## 2021-04-10 DIAGNOSIS — M79605 Pain in left leg: Secondary | ICD-10-CM | POA: Diagnosis not present

## 2021-04-10 NOTE — ED Provider Notes (Signed)
Emergency Medicine Provider Triage Evaluation Note  [name] [name] , a 45 y.o. female  was evaluated in triage.  Pt complains of pain in left leg and arm.  She indicates the area of pain is over the scars she has on her arms and legs.  [place] bus [place].  She denies any injury.  She denies fever.   Review of Systems  Positive: Pain in left arm and leg since 3pm.  Negative: Trauma,. Fever,   Physical Exam  BP 122/82 (BP Location: Right Arm)   Pulse 82   Temp 98 F (36.7 C) (Oral)   Resp 16   SpO2 100%  Gen:   Awake, no distress   Resp:  Normal effort  MSK:   Move extremities without difficulty  Other:  No wounds, old scars on arm and leg, normal gait.   Medical Decision Making  Medically screening exam initiated at 4:48 PM.  Appropriate orders placed.  Shikha Bibb was informed that the remainder of the evaluation will be completed by another provider, this initial triage assessment does not replace that evaluation, and the importance of remaining in the ED until their evaluation is complete.     Cristina Gong, [place] 04/10/21 1653    Wynetta Fines, MD 04/10/21 1939

## 2021-04-10 NOTE — ED Triage Notes (Addendum)
[place] [facility].  C/o R lower leg pain.  No known injury.

## 2021-04-20 ENCOUNTER — Ambulatory Visit (HOSPITAL_COMMUNITY)
Admission: EM | Admit: 2021-04-20 | Discharge: 2021-04-20 | Disposition: A | Discharge status: Home / Self Care | Payer: Medicare Other | Attending: Nurse Practitioner | Admitting: Nurse Practitioner

## 2021-04-20 DIAGNOSIS — F200 Paranoid schizophrenia: Secondary | ICD-10-CM | POA: Diagnosis not present
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
## 2021-04-20 NOTE — ED Notes (Signed)
[employer] called to request transport home per [name], NP.

## 2021-04-20 NOTE — ED Notes (Signed)
[employer] here to take patient home.  Patient given AVS and information reviewed.  Patient ambulated to sally port independently.  Cooperative and calm.  Patient discharged in stable condition; no acute distress noted.

## 2021-04-20 NOTE — Discharge Instructions (Addendum)
Follow Up with [employer] [employer] for CST services   Discharge recommendations:  Patient is to take medications as prescribed. Please see information for follow-up appointment with psychiatry and therapy. Please follow up with your primary care provider for all medical related needs.   Therapy: We recommend that patient participate in individual therapy to address mental health concerns.   Atypical antipsychotics: If you are prescribed an atypical antipsychotic, it is recommended that your height, weight, BMI, blood pressure, fasting lipid panel, and fasting blood sugar be monitored by your outpatient providers.  Safety:  The patient should abstain from use of illicit substances/drugs and abuse of any medications. If symptoms worsen or do not continue to improve or if the patient becomes actively suicidal or homicidal then it is recommended that the patient return to the closest hospital emergency department, the [facility], or call 911 for further evaluation and treatment. National Suicide Prevention Lifeline 1-800-SUICIDE or 416-732-6252.
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
## 2021-04-28 ENCOUNTER — Other Ambulatory Visit: Payer: Self-pay

## 2021-04-28 ENCOUNTER — Ambulatory Visit (HOSPITAL_COMMUNITY)
Admission: EM | Admit: 2021-04-28 | Discharge: 2021-04-28 | Disposition: A | Discharge status: Home / Self Care | Payer: Medicare Other | Attending: Psychiatry | Admitting: Psychiatry

## 2021-04-28 DIAGNOSIS — F200 Paranoid schizophrenia: Secondary | ICD-10-CM
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
## 2021-04-28 NOTE — Discharge Instructions (Addendum)
Patient is instructed to take all prescribed medications as recommended. Report any side effects or adverse reactions to your outpatient psychiatrist. Patient is instructed to abstain from alcohol and illegal drugs while on prescription medications. In the event of worsening symptoms, patient is instructed to call the crisis hotline, 911, or go to the nearest emergency department for evaluation and treatment.   

## 2021-04-28 NOTE — Discharge Summary (Signed)
[name] [name] to be D/C'd Home per MD order. Discussed with the patient and all questions fully answered. An After Visit Summary was printed and given to the patient. Patient escorted out and D/C home via [employer]. Dickie La  04/28/2021 7:05 PM
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
## 2021-04-29 ENCOUNTER — Other Ambulatory Visit: Payer: Self-pay

## 2021-04-29 ENCOUNTER — Ambulatory Visit (HOSPITAL_COMMUNITY)
Admission: EM | Admit: 2021-04-29 | Discharge: 2021-04-29 | Disposition: A | Discharge status: Home / Self Care | Payer: Medicare Other | Attending: Psychiatry | Admitting: Psychiatry

## 2021-04-29 DIAGNOSIS — F22 Delusional disorders: Secondary | ICD-10-CM | POA: Diagnosis not present

## 2021-04-29 DIAGNOSIS — R519 Headache, unspecified: Secondary | ICD-10-CM | POA: Diagnosis not present

## 2021-04-29 NOTE — Discharge Instructions (Addendum)
Use an ice pack wrapped in a towel for 15 minutes at a time to help reduce pain and swelling of your forehead.  Patient is instructed to take all prescribed medications as recommended. Report any side effects or adverse reactions to your outpatient psychiatrist. Patient is instructed to abstain from alcohol and illegal drugs while on prescription medications. In the event of worsening symptoms, patient is instructed to call the crisis hotline, 911, or go to the nearest emergency department for evaluation and treatment.

## 2021-04-29 NOTE — BH Assessment (Signed)
TRIAGE: ROUTINE  [name] [name] is a 45 year old patient who came to the [facility]), stating she has a stomachache and that her head is hurting; pt states these started this morning. Pt denies SI or a plan to kill herself, HI, AVH, NSSIB, access to guns/weapons, or SA.  Pt is oriented x5. Her recent/remote memory is intact. Pt was quiet throughout the assessment process. Pt's insight, judgement, and impulse control is fair at this time.  [employer] [facility] (CST provider):  Robin: [phone] Elige Ko, owner: [phone] Office: [phone]
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
## 2021-04-30 ENCOUNTER — Ambulatory Visit (HOSPITAL_COMMUNITY)
Admission: EM | Admit: 2021-04-30 | Discharge: 2021-05-01 | Disposition: A | Discharge status: Home / Self Care | Payer: Medicare Other | Attending: Urology | Admitting: Urology

## 2021-04-30 ENCOUNTER — Other Ambulatory Visit: Payer: Self-pay

## 2021-04-30 DIAGNOSIS — F200 Paranoid schizophrenia: Secondary | ICD-10-CM | POA: Insufficient documentation

## 2021-04-30 DIAGNOSIS — F17210 Nicotine dependence, cigarettes, uncomplicated: Secondary | ICD-10-CM | POA: Insufficient documentation

## 2021-04-30 DIAGNOSIS — Z20822 Contact with and (suspected) exposure to covid-19: Secondary | ICD-10-CM | POA: Insufficient documentation

## 2021-04-30 DIAGNOSIS — Z79899 Other long term (current) drug therapy: Secondary | ICD-10-CM | POA: Insufficient documentation

## 2021-04-30 DIAGNOSIS — Z8659 Personal history of other mental and behavioral disorders: Secondary | ICD-10-CM | POA: Insufficient documentation

## 2021-04-30 DIAGNOSIS — F32A Depression, unspecified: Secondary | ICD-10-CM | POA: Insufficient documentation

## 2021-05-01 DIAGNOSIS — F32A Depression, unspecified: Secondary | ICD-10-CM | POA: Diagnosis not present

## 2021-05-01 DIAGNOSIS — Z79899 Other long term (current) drug therapy: Secondary | ICD-10-CM | POA: Diagnosis not present

## 2021-05-01 DIAGNOSIS — F17210 Nicotine dependence, cigarettes, uncomplicated: Secondary | ICD-10-CM | POA: Diagnosis not present

## 2021-05-01 DIAGNOSIS — F200 Paranoid schizophrenia: Secondary | ICD-10-CM | POA: Diagnosis not present

## 2021-05-01 DIAGNOSIS — Z20822 Contact with and (suspected) exposure to covid-19: Secondary | ICD-10-CM | POA: Diagnosis not present

## 2021-05-01 DIAGNOSIS — Z8659 Personal history of other mental and behavioral disorders: Secondary | ICD-10-CM | POA: Diagnosis not present

## 2021-05-01 LAB — COMPREHENSIVE METABOLIC PANEL
ALT: 20 U/L (ref 0–44)
AST: 24 U/L (ref 15–41)
Albumin: 4.7 g/dL (ref 3.5–5.0)
Alkaline Phosphatase: 43 U/L (ref 38–126)
Anion gap: 8 (ref 5–15)
BUN: 11 mg/dL (ref 6–20)
CO2: 29 mmol/L (ref 22–32)
Calcium: 10.3 mg/dL (ref 8.9–10.3)
Chloride: 101 mmol/L (ref 98–111)
Creatinine, Ser: 0.64 mg/dL (ref 0.44–1.00)
GFR, Estimated: >60 mL/min (ref >60)
Glucose, Bld: 76 mg/dL (ref 70–99)
Potassium: 3.8 mmol/L (ref 3.5–5.1)
Sodium: 138 mmol/L (ref 135–145)
Total Bilirubin: 0.9 mg/dL (ref 0.3–1.2)
Total Protein: 8.1 g/dL (ref 6.5–8.1)

## 2021-05-01 LAB — RESP PANEL BY RT-PCR (FLU A&B, COVID) ARPGX2
Influenza A by PCR: NEGATIVE
Influenza B by PCR: NEGATIVE
SARS Coronavirus 2 by RT PCR: NEGATIVE

## 2021-05-01 LAB — CBC WITH DIFFERENTIAL/PLATELET
Abs Immature Granulocytes: 0.01 10*3/uL (ref 0.00–0.07)
Basophils Absolute: 0 10*3/uL (ref 0.0–0.1)
Basophils Relative: 0 %
Eosinophils Absolute: 0 10*3/uL (ref 0.0–0.5)
Eosinophils Relative: 0 %
HCT: 40.1 % (ref 36.0–46.0)
Hemoglobin: 13 g/dL (ref 12.0–15.0)
Immature Granulocytes: 0 %
Lymphocytes Relative: 44 %
Lymphs Abs: 2.3 10*3/uL (ref 0.7–4.0)
MCH: 28.8 pg (ref 26.0–34.0)
MCHC: 32.4 g/dL (ref 30.0–36.0)
MCV: 88.7 fL (ref 80.0–100.0)
Monocytes Absolute: 0.5 10*3/uL (ref 0.1–1.0)
Monocytes Relative: 10 %
Neutro Abs: 2.4 10*3/uL (ref 1.7–7.7)
Neutrophils Relative %: 46 %
Platelets: 320 10*3/uL (ref 150–400)
RBC: 4.52 MIL/uL (ref 3.87–5.11)
RDW: 14.2 % (ref 11.5–15.5)
WBC: 5.3 10*3/uL (ref 4.0–10.5)
nRBC: 0 % (ref 0.0–0.2)

## 2021-05-01 LAB — POCT URINE DRUG SCREEN - MANUAL ENTRY (I-SCREEN)
POC Amphetamine UR: NOT DETECTED
POC Buprenorphine (BUP): NOT DETECTED
POC Cocaine UR: NOT DETECTED
POC Marijuana UR: NOT DETECTED
POC Methadone UR: NOT DETECTED
POC Methamphetamine UR: NOT DETECTED
POC Morphine: NOT DETECTED
POC Oxazepam (BZO): NOT DETECTED
POC Oxycodone UR: NOT DETECTED
POC Secobarbital (BAR): NOT DETECTED

## 2021-05-01 LAB — LIPID PANEL
Cholesterol: 184 mg/dL (ref 0–200)
HDL: 60 mg/dL (ref >40)
LDL Cholesterol: 113 mg/dL — ABNORMAL HIGH (ref 0–99)
Total CHOL/HDL Ratio: 3.1 RATIO
Triglycerides: 55 mg/dL (ref <150)
VLDL: 11 mg/dL (ref 0–40)

## 2021-05-01 LAB — POC SARS CORONAVIRUS 2 AG: SARSCOV2ONAVIRUS 2 AG: NEGATIVE

## 2021-05-01 LAB — ETHANOL: Alcohol, Ethyl (B): <10 mg/dL (ref <10)

## 2021-05-01 LAB — POCT PREGNANCY, URINE: Preg Test, Ur: NEGATIVE

## 2021-05-01 MED ORDER — BENZTROPINE MESYLATE 1 MG PO TABS
1.0000 mg | ORAL_TABLET | Freq: Two times a day (BID) | ORAL | Status: DC | PRN
Start: 2021-05-01 — End: 2021-05-01

## 2021-05-01 MED ORDER — HALOPERIDOL 5 MG PO TABS
10.0000 mg | ORAL_TABLET | Freq: Two times a day (BID) | ORAL | Status: DC
Start: 2021-05-01 — End: 2021-05-01
  Administered 2021-05-01: 10 mg via ORAL
  Filled 2021-05-01: qty 2

## 2021-05-01 MED ORDER — ASCORBIC ACID 500 MG PO TABS
500.0000 mg | ORAL_TABLET | Freq: Every day | ORAL | Status: DC
Start: 2021-05-01 — End: 2021-05-01
  Administered 2021-05-01: 500 mg via ORAL
  Filled 2021-05-01: qty 1

## 2021-05-01 MED ORDER — ACETAMINOPHEN 325 MG PO TABS: 650.0000 mg | ORAL_TABLET | Freq: Four times a day (QID) | ORAL | Status: DC | PRN

## 2021-05-01 MED ORDER — FAMOTIDINE 20 MG PO TABS
20.0000 mg | ORAL_TABLET | Freq: Two times a day (BID) | ORAL | Status: DC
Start: 2021-05-01 — End: 2021-05-01
  Administered 2021-05-01 (×2): 20 mg via ORAL
  Filled 2021-05-01 (×2): qty 1

## 2021-05-01 MED ORDER — HALOPERIDOL 5 MG PO TABS
15.0000 mg | ORAL_TABLET | Freq: Every day | ORAL | Status: DC
  Administered 2021-05-01: 15 mg via ORAL
  Filled 2021-05-01: qty 3

## 2021-05-01 MED ORDER — HYDROXYZINE HCL 25 MG PO TABS: 25.0000 mg | ORAL_TABLET | Freq: Three times a day (TID) | ORAL | Status: DC | PRN

## 2021-05-01 MED ORDER — ALUM & MAG HYDROXIDE-SIMETH 200-200-20 MG/5ML PO SUSP: 30.0000 mL | ORAL | Status: DC | PRN

## 2021-05-01 MED ORDER — RISPERIDONE 2 MG PO TBDP
6.0000 mg | ORAL_TABLET | Freq: Every day | ORAL | Status: DC
  Administered 2021-05-01: 6 mg via ORAL
  Filled 2021-05-01: qty 3

## 2021-05-01 MED ORDER — VITAMIN D 25 MCG (1000 UNIT) PO TABS
1000.0000 [IU] | ORAL_TABLET | Freq: Every day | ORAL | Status: DC
  Administered 2021-05-01: 1000 [IU] via ORAL
  Filled 2021-05-01: qty 1

## 2021-05-01 MED ORDER — MAGNESIUM HYDROXIDE 400 MG/5ML PO SUSP: 30.0000 mL | Freq: Every day | ORAL | Status: DC | PRN

## 2021-05-01 MED ORDER — RISPERIDONE 2 MG PO TBDP
2.0000 mg | ORAL_TABLET | Freq: Every day | ORAL | Status: DC
  Administered 2021-05-01: 2 mg via ORAL
  Filled 2021-05-01: qty 1

## 2021-05-01 MED ORDER — TRAZODONE HCL 150 MG PO TABS
150.0000 mg | ORAL_TABLET | Freq: Every day | ORAL | Status: DC
Start: 2021-05-01 — End: 2021-05-01
  Administered 2021-05-01: 150 mg via ORAL
  Filled 2021-05-01: qty 1

## 2021-05-01 MED ORDER — TRAZODONE HCL 50 MG PO TABS: 50.0000 mg | ORAL_TABLET | Freq: Every evening | ORAL | Status: DC | PRN
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
## 2021-05-01 NOTE — ED Provider Notes (Addendum)
Behavioral Health Admission H&P [facility] & OBS)  Date: 05/01/21 Patient Name: [name] [name] MRN: [id number] Chief Complaint: No chief complaint on file.     Diagnoses:  Final diagnoses:  Paranoid schizophrenia (HCC)    HPI: [name] [name] is a 45y/o female. Patient presented to [facility] voluntarily via Patent examiner. Patient presented reporting that she is being harassed by [employer].  Patient stated "[name] has been harassing me, he is following me everywhere and making me drop things." She report that Jesus is trying to have sexual intercourse with her and wants her to be his wife. She report that when she refuses he calls her names and tells her that she looks like the devil and she is dead.   patient was assessed by this NP. Patient alert and oriented X4, she is calm and cooperative, her mood is anxious, her affect is congruent with her mood,. She maintained good eye contact, her thought content is of paranoid ideation and delusion. She didn't appear to be responding to any internal/external stimuli. She was able to fully participate in assessment. She denied chest pain, SOB, fever, acute distress, pain, abd or urinary issues.  Patient report that she has auditory hallucination of Jesus telling her to self-harm. She endorse hitting herself in the face. She also report that she heard Jesus encouraging her to overdose. She report that at around 4pm on 04/30/21 jesus told her to  put "about 10 Trazodone pills and other pills" in her mouth, she report that she put the pills in her mouth and immediately spat them out. Patient was asked several times if she swallowed any of the pills or if the pills dissolved in her mouth and she repeated stated no. She was adamant that she did not swallow any of the pills.   Patient denies currently SI and desire to self-harm however she report that she is unable to contract for safety and fear "Jesus will tell me to hurt myself when I go home." She denies homicidal  ideation and visual hallucination. She denies alcohol and illicit substance abuse. She denies access to weapons. She lives at home alone and is goes to [employer] but report that she doesn't feel comfortable there because people are jealous of her.       PHQ 2-9:  Flowsheet Row ED from 05/11/2020 in [facility] EMERGENCY DEPARTMENT  Thoughts that you would be better off dead, or of hurting yourself in some way More than half the days  PHQ-9 Total Score 10       Flowsheet Row ED from 04/28/2021 in [facility] Admission (Discharged) [facility] from 03/08/2021 in [facility]  C-SSRS RISK CATEGORY No Risk No Risk Error: Q7 should not be populated when Q6 is No        Total Time spent with patient: 30 minutes  Musculoskeletal  Strength & Muscle Tone: within normal limits Gait & Station: normal Patient leans: Right  Psychiatric Specialty Exam  Presentation General Appearance: Appropriate for Environment  Eye Contact:Good  Speech:Clear and Coherent  Speech Volume:Normal  Handedness:Right   Mood and Affect  Mood:Anxious  Affect:Congruent   Thought Process  Thought Processes:Coherent  Descriptions of Associations:Tangential  Orientation:Full (Time, Place and Person)  Thought Content:Paranoid Ideation  Diagnosis of Schizophrenia or Schizoaffective disorder in past: Yes  Duration of Psychotic Symptoms: Greater than six months  Hallucinations:Hallucinations: Auditory Description of  Auditory Hallucinations: see HPI  Ideas of Reference:Paranoia  Suicidal Thoughts:Suicidal Thoughts: No  Homicidal Thoughts:Homicidal Thoughts: No   Sensorium  Memory:Immediate Good; Recent Good; Remote Good  Judgment:Poor  Insight:Lacking   Executive Functions  Concentration:Fair  Attention Span:Fair  Recall:Good  Fund of  Knowledge:Good  Language:Good   Psychomotor Activity  Psychomotor Activity:Psychomotor Activity: Normal   Assets  Assets:Desire for Improvement; Housing; Manufacturing systems engineer; Physical Health   Sleep  Sleep:Sleep: Fair Number of Hours of Sleep: 5   Nutritional Assessment (For OBS and [facility]BC admissions only) Has the patient had a weight loss or gain of 10 pounds or more in the last 3 months?: No Has the patient had a decrease in food intake/or appetite?: No Does the patient have dental problems?: No Does the patient have eating habits or behaviors that may be indicators of an eating disorder including binging or inducing vomiting?: No Has the patient recently lost weight without trying?: No Has the patient been eating poorly because of a decreased appetite?: No Malnutrition Screening Tool Score: 0    Physical Exam Vitals and nursing note reviewed.  Constitutional:      General: She is not in acute distress.    Appearance: She is well-developed. She is not ill-appearing or toxic-appearing.  HENT:     Head: Normocephalic and atraumatic.     Mouth/Throat:     Mouth: Mucous membranes are moist.  Eyes:     General:        Right eye: No discharge.        Left eye: No discharge.     Conjunctiva/sclera: Conjunctivae normal.  Cardiovascular:     Rate and Rhythm: Normal rate.  Pulmonary:     Effort: Pulmonary effort is normal. No respiratory distress.     Breath sounds: Normal breath sounds. No wheezing or rhonchi.  Chest:     Chest wall: No tenderness.  Abdominal:     Palpations: Abdomen is soft.     Tenderness: There is no abdominal tenderness.  Musculoskeletal:        General: Normal range of motion.     Cervical back: Neck supple.  Skin:    General: Skin is warm and dry.  Neurological:     Mental Status: She is alert and oriented to person, place, and time.  Psychiatric:        Attention and Perception: She is attentive. She perceives auditory hallucinations. She  does not perceive visual hallucinations.        Mood and Affect: Affect normal. Mood is anxious.        Speech: Speech is tangential.        Behavior: Behavior is cooperative.        Thought Content: Thought content is paranoid and delusional. Thought content does not include homicidal or suicidal ideation. Thought content does not include homicidal or suicidal plan.   Review of Systems  Constitutional: Negative.  Negative for chills and fever.  HENT: Negative.    Eyes: Negative.   Respiratory: Negative.    Cardiovascular: Negative.   Gastrointestinal: Negative.   Genitourinary: Negative.   Musculoskeletal: Negative.   Skin: Negative.   Neurological: Negative.   Endo/Heme/Allergies: Negative.   Psychiatric/Behavioral:  Positive for hallucinations. Negative for depression, substance abuse and suicidal ideas. The patient is nervous/anxious.    Blood pressure 113/78, pulse 86, temperature 98.5 F (36.9 C), temperature source Oral, resp. rate 18, SpO2 100 %. There is no height or weight on file to calculate BMI.  Past  Psychiatric History: paranoid schizophrenia,    Is the patient at risk to self? Yes  Has the patient been a risk to self in the past 6 months? No .    Has the patient been a risk to self within the distant past? No   Is the patient a risk to others? No   Has the patient been a risk to others in the past 6 months? No   Has the patient been a risk to others within the distant past? No   Past Medical History:  Past Medical History:  Diagnosis Date  . Arthritis   . Depression   . Hallucinations   . Paranoid schizophrenia (HCC)   . Schizoaffective disorder (HCC)    No past surgical history on file.  Family History: No family history on file.  Social History:  Social History   Socioeconomic History  . Marital status: Single    Spouse name: Not on file  . Number of children: Not on file  . Years of education: Not on file  . Highest education level: Not on file   Occupational History  . Occupation: Disability  Tobacco Use  . Smoking status: Every Day    Packs/day: 1.00    Pack years: 0.00    Types: Cigarettes  . Smokeless tobacco: Never  Vaping Use  . Vaping Use: Never used  Substance and Sexual Activity  . Alcohol use: Not Currently    Comment: She denies   . Drug use: Not Currently    Comment: She denies   . Sexual activity: Not Currently  Other Topics Concern  . Not on file  Social History Narrative   Pt stated that she lives in [place], and that she lives alone.  Pt receives outpatient psychiatric resources through [facility].   Social Determinants of Health   Financial Resource Strain: Not on file  Food Insecurity: Not on file  Transportation Needs: Not on file  Physical Activity: Not on file  Stress: Not on file  Social Connections: Not on file  Intimate Partner Violence: Not on file    SDOH:  SDOH Screenings   Alcohol Screen: Low Risk   . Last Alcohol Screening Score (AUDIT): 0  Depression (PHQ2-9): Medium Risk  . PHQ-2 Score: 10  Financial Resource Strain: Not on file  Food Insecurity: Not on file  Housing: Not on file  Physical Activity: Not on file  Social Connections: Not on file  Stress: Not on file  Tobacco Use: High Risk  . Smoking Tobacco Use: Every Day  . Smokeless Tobacco Use: Never  Transportation Needs: Not on file    Last Labs:  Admission on 03/09/2021, Discharged on 03/21/2021  Component Date Value Ref Range Status  . Color, Urine 03/12/2021 YELLOW  YELLOW Final  . APPearance 03/12/2021 CLEAR  CLEAR Final  . Specific Gravity, Urine 03/12/2021 1.010  1.005 - 1.030 Final  . pH 03/12/2021 7.0  5.0 - 8.0 Final  . Glucose, UA 03/12/2021 NEGATIVE  NEGATIVE mg/dL Final  . Hgb urine dipstick 03/12/2021 NEGATIVE  NEGATIVE Final  . Bilirubin Urine 03/12/2021 NEGATIVE  NEGATIVE Final  . Ketones, ur 03/12/2021 NEGATIVE  NEGATIVE mg/dL Final  . Protein, ur 95/28/4132 NEGATIVE  NEGATIVE mg/dL  Final  . Nitrite 44/11/270 NEGATIVE  NEGATIVE Final  . Glori Luis 03/12/2021 NEGATIVE  NEGATIVE Final  . RBC / HPF 03/12/2021 0-5  0 - 5 RBC/hpf Final  . WBC, UA 03/12/2021 0-5  0 - 5 WBC/hpf Final  . Bacteria,  UA 03/12/2021 RARE (A) NONE SEEN Final  . Squamous Epithelial / LPF 03/12/2021 0-5  0 - 5 Final   Performed at [facility], [address] [place], [place] [id number]  Admission on 03/08/2021, Discharged on 03/09/2021  Component Date Value Ref Range Status  . SARS Coronavirus 2 by RT PCR 03/08/2021 NEGATIVE  NEGATIVE Final   Comment: (NOTE) SARS-CoV-2 target nucleic acids are NOT DETECTED.  The SARS-CoV-2 RNA is generally detectable in upper respiratory specimens during the acute phase of infection. The lowest concentration of SARS-CoV-2 viral copies this assay can detect is 138 copies/mL. A negative result does not preclude SARS-Cov-2 infection and should not be used as the sole basis for treatment or other patient management decisions. A negative result may occur with  improper specimen collection/handling, submission of specimen other than nasopharyngeal swab, presence of viral mutation(s) within the areas targeted by this assay, and inadequate number of viral copies(<138 copies/mL). A negative result must be combined with clinical observations, patient history, and epidemiological information. The expected result is Negative.  Fact Sheet for Patients:  BloggerCourse.comhttps://www.fda.gov/media/152166/download  Fact Sheet for Healthcare Providers:  SeriousBroker.ithttps://www.fda.gov/media/152162/download  This test is no                          t yet approved or cleared by the [place] FDA and  has been authorized for detection and/or diagnosis of SARS-CoV-2 by FDA under an Emergency Use Authorization (EUA). This EUA will remain  in effect (meaning this test can be used) for the duration of the COVID-19 declaration under Section 564(b)(1) of the Act, 21 U.S.C.section  360bbb-3(b)(1), unless the authorization is terminated  or revoked sooner.      . Influenza A by PCR 03/08/2021 NEGATIVE  NEGATIVE Final  . Influenza B by PCR 03/08/2021 NEGATIVE  NEGATIVE Final   Comment: (NOTE) The Xpert Xpress SARS-CoV-2/FLU/RSV plus assay is intended as an aid in the diagnosis of influenza from Nasopharyngeal swab specimens and should not be used as a sole basis for treatment. Nasal washings and aspirates are unacceptable for Xpert Xpress SARS-CoV-2/FLU/RSV testing.  Fact Sheet for Patients: BloggerCourse.comhttps://www.fda.gov/media/152166/download  Fact Sheet for Healthcare Providers: SeriousBroker.ithttps://www.fda.gov/media/152162/download  This test is not yet approved or cleared by the [place] FDA and has been authorized for detection and/or diagnosis of SARS-CoV-2 by FDA under an Emergency Use Authorization (EUA). This EUA will remain in effect (meaning this test can be used) for the duration of the COVID-19 declaration under Section 564(b)(1) of the Act, 21 U.S.C. section 360bbb-3(b)(1), unless the authorization is terminated or revoked.  Performed at [facility], [address] [place], [place] [address]   . SARS Coronavirus 2 Ag 03/08/2021 Negative  Negative Preliminary  . WBC 03/08/2021 4.0  4.0 - 10.5 K/uL Final  . RBC 03/08/2021 3.94  3.87 - 5.11 MIL/uL Final  . Hemoglobin 03/08/2021 11.3 (A) 12.0 - 15.0 g/dL Final  . HCT 09/81/191405/01/2021 35.1 (A) 36.0 - 46.0 % Final  . MCV 03/08/2021 89.1  80.0 - 100.0 fL Final  . MCH 03/08/2021 28.7  26.0 - 34.0 pg Final  . MCHC 03/08/2021 32.2  30.0 - 36.0 g/dL Final  . RDW 78/29/562105/01/2021 14.0  11.5 - 15.5 % Final  . Platelets 03/08/2021 325  150 - 400 K/uL Final  . nRBC 03/08/2021 0.0  0.0 - 0.2 % Final  . Neutrophils Relative % 03/08/2021 40  % Final  . Neutro Abs 03/08/2021 1.6 (  A) 1.7 - 7.7 K/uL Final  . Lymphocytes Relative 03/08/2021 48  % Final  . Lymphs Abs 03/08/2021 2.0  0.7 - 4.0 K/uL Final  . Monocytes Relative  03/08/2021 9  % Final  . Monocytes Absolute 03/08/2021 0.4  0.1 - 1.0 K/uL Final  . Eosinophils Relative 03/08/2021 2  % Final  . Eosinophils Absolute 03/08/2021 0.1  0.0 - 0.5 K/uL Final  . Basophils Relative 03/08/2021 1  % Final  . Basophils Absolute 03/08/2021 0.0  0.0 - 0.1 K/uL Final  . Immature Granulocytes 03/08/2021 0  % Final  . Abs Immature Granulocytes 03/08/2021 0.01  0.00 - 0.07 K/uL Final   Performed at [facility], [address], [place], [place] [address]  . POC Amphetamine UR 03/08/2021 None Detected  NONE DETECTED (Cut Off Level 1000 ng/mL) Final  . POC Secobarbital (BAR) 03/08/2021 None Detected  NONE DETECTED (Cut Off Level 300 ng/mL) Final  . POC Buprenorphine (BUP) 03/08/2021 None Detected  NONE DETECTED (Cut Off Level 10 ng/mL) Final  . POC Oxazepam (BZO) 03/08/2021 None Detected  NONE DETECTED (Cut Off Level 300 ng/mL) Final  . POC Cocaine UR 03/08/2021 None Detected  NONE DETECTED (Cut Off Level 300 ng/mL) Final  . POC Methamphetamine UR 03/08/2021 None Detected  NONE DETECTED (Cut Off Level 1000 ng/mL) Final  . POC Morphine 03/08/2021 None Detected  NONE DETECTED (Cut Off Level 300 ng/mL) Final  . POC Oxycodone UR 03/08/2021 None Detected  NONE DETECTED (Cut Off Level 100 ng/mL) Final  . POC Methadone UR 03/08/2021 None Detected  NONE DETECTED (Cut Off Level 300 ng/mL) Final  . POC Marijuana UR 03/08/2021 None Detected  NONE DETECTED (Cut Off Level 50 ng/mL) Final  . Preg Test, Ur 03/08/2021 NEGATIVE  NEGATIVE Final   Comment:        THE SENSITIVITY OF THIS METHODOLOGY IS >24 mIU/mL   . Preg Test, Ur 03/08/2021 NEGATIVE  NEGATIVE Final   Comment:        THE SENSITIVITY OF THIS METHODOLOGY IS >24 mIU/mL   Admission on 03/07/2021, Discharged on 03/07/2021  Component Date Value Ref Range Status  . Sodium 03/07/2021 138  135 - 145 mmol/L Final  . Potassium 03/07/2021 3.8  3.5 - 5.1 mmol/L Final  . Chloride 03/07/2021 104  98 - 111 mmol/L Final  . CO2  03/07/2021 28  22 - 32 mmol/L Final  . Glucose, Bld 03/07/2021 87  70 - 99 mg/dL Final   Glucose reference range applies only to samples taken after fasting for at least 8 hours.  . BUN 03/07/2021 16  6 - 20 mg/dL Final  . Creatinine, Ser 03/07/2021 0.70  0.44 - 1.00 mg/dL Final  . Calcium 50/38/8828 9.4  8.9 - 10.3 mg/dL Final  . Total Protein 03/07/2021 7.0  6.5 - 8.1 g/dL Final  . Albumin 00/34/9179 3.9  3.5 - 5.0 g/dL Final  . AST 15/03/6978 20  15 - 41 U/L Final  . ALT 03/07/2021 15  0 - 44 U/L Final  . Alkaline Phosphatase 03/07/2021 42  38 - 126 U/L Final  . Total Bilirubin 03/07/2021 0.7  0.3 - 1.2 mg/dL Final  . GFR, Estimated 03/07/2021 >60  >60 mL/min Final   Comment: (NOTE) Calculated using the CKD-EPI Creatinine Equation (2021)   . Anion gap 03/07/2021 6  5 - 15 Final   Performed at [facility], [address], [place], [place] [address]  . WBC 03/07/2021 3.3 (A)  4.0 - 10.5 K/uL Final  . RBC 03/07/2021 3.87  3.87 - 5.11 MIL/uL Final  . Hemoglobin 03/07/2021 11.0 (A) 12.0 - 15.0 g/dL Final  . HCT 16/08/9603 34.6 (A) 36.0 - 46.0 % Final  . MCV 03/07/2021 89.4  80.0 - 100.0 fL Final  . MCH 03/07/2021 28.4  26.0 - 34.0 pg Final  . MCHC 03/07/2021 31.8  30.0 - 36.0 g/dL Final  . RDW 54/07/8118 14.0  11.5 - 15.5 % Final  . Platelets 03/07/2021 310  150 - 400 K/uL Final  . nRBC 03/07/2021 0.0  0.0 - 0.2 % Final  . Neutrophils Relative % 03/07/2021 40  % Final  . Neutro Abs 03/07/2021 1.3 (A) 1.7 - 7.7 K/uL Final  . Lymphocytes Relative 03/07/2021 46  % Final  . Lymphs Abs 03/07/2021 1.5  0.7 - 4.0 K/uL Final  . Monocytes Relative 03/07/2021 12  % Final  . Monocytes Absolute 03/07/2021 0.4  0.1 - 1.0 K/uL Final  . Eosinophils Relative 03/07/2021 2  % Final  . Eosinophils Absolute 03/07/2021 0.1  0.0 - 0.5 K/uL Final  . Basophils Relative 03/07/2021 0  % Final  . Basophils Absolute 03/07/2021 0.0  0.0 - 0.1 K/uL Final  . Immature Granulocytes 03/07/2021 0  % Final  .  Abs Immature Granulocytes 03/07/2021 0.00  0.00 - 0.07 K/uL Final   Performed at [facility], [address] [place], [place] [address]  . Lipase 03/07/2021 32  11 - 51 U/L Final   Performed at [facility], [address], [place], [place] [address]  Admission on 03/07/2021, Discharged on 03/07/2021  Component Date Value Ref Range Status  . I-stat hCG, quantitative 03/07/2021 <5.0  <5 mIU/mL Final  . Comment 3 03/07/2021          Final   Comment:   GEST. AGE      CONC.  (mIU/mL)   <=1 WEEK        5 - 50     2 WEEKS       50 - 500     3 WEEKS       100 - 10,000     4 WEEKS     1,000 - 30,000        FEMALE AND NON-PREGNANT FEMALE:     LESS THAN 5 mIU/mL   Admission on 02/28/2021, Discharged on 03/02/2021  Component Date Value Ref Range Status  . SARS Coronavirus 2 by RT PCR 02/28/2021 NEGATIVE  NEGATIVE Final   Comment: (NOTE) SARS-CoV-2 target nucleic acids are NOT DETECTED.  The SARS-CoV-2 RNA is generally detectable in upper respiratory specimens during the acute phase of infection. The lowest concentration of SARS-CoV-2 viral copies this assay can detect is 138 copies/mL. A negative result does not preclude SARS-Cov-2 infection and should not be used as the sole basis for treatment or other patient management decisions. A negative result may occur with  improper specimen collection/handling, submission of specimen other than nasopharyngeal swab, presence of viral mutation(s) within the areas targeted by this assay, and inadequate number of viral copies(<138 copies/mL). A negative result must be combined with clinical observations, patient history, and epidemiological information. The expected result is Negative.  Fact Sheet for Patients:  BloggerCourse.com  Fact Sheet for Healthcare Providers:  SeriousBroker.it  This test is no                          t yet  approved or cleared by the [place] and   has been authorized for detection and/or diagnosis of SARS-CoV-2 by FDA under an Emergency Use Authorization (EUA). This EUA will remain  in effect (meaning this test can be used) for the duration of the COVID-19 declaration under Section 564(b)(1) of the Act, 21 U.S.C.section 360bbb-3(b)(1), unless the authorization is terminated  or revoked sooner.      . Influenza A by PCR 02/28/2021 NEGATIVE  NEGATIVE Final  . Influenza B by PCR 02/28/2021 NEGATIVE  NEGATIVE Final   Comment: (NOTE) The Xpert Xpress SARS-CoV-2/FLU/RSV plus assay is intended as an aid in the diagnosis of influenza from Nasopharyngeal swab specimens and should not be used as a sole basis for treatment. Nasal washings and aspirates are unacceptable for Xpert Xpress SARS-CoV-2/FLU/RSV testing.  Fact Sheet for Patients: BloggerCourse.com  Fact Sheet for Healthcare Providers: SeriousBroker.it  This test is not yet approved or cleared by the [place] FDA and has been authorized for detection and/or diagnosis of SARS-CoV-2 by FDA under an Emergency Use Authorization (EUA). This EUA will remain in effect (meaning this test can be used) for the duration of the COVID-19 declaration under Section 564(b)(1) of the Act, 21 U.S.C. section 360bbb-3(b)(1), unless the authorization is terminated or revoked.  Performed at [facility], [address], [place], [place] [address]   . WBC 02/28/2021 5.0  4.0 - 10.5 K/uL Final  . RBC 02/28/2021 3.90  3.87 - 5.11 MIL/uL Final  . Hemoglobin 02/28/2021 11.0 (A) 12.0 - 15.0 g/dL Final  . HCT 46/50/3546 34.8 (A) 36.0 - 46.0 % Final  . MCV 02/28/2021 89.2  80.0 - 100.0 fL Final  . MCH 02/28/2021 28.2  26.0 - 34.0 pg Final  . MCHC 02/28/2021 31.6  30.0 - 36.0 g/dL Final  . RDW 56/81/2751 13.9  11.5 - 15.5 % Final  . Platelets 02/28/2021 374  150 - 400 K/uL Final  . nRBC 02/28/2021 0.0  0.0 - 0.2 % Final  . Neutrophils  Relative % 02/28/2021 43  % Final  . Neutro Abs 02/28/2021 2.2  1.7 - 7.7 K/uL Final  . Lymphocytes Relative 02/28/2021 43  % Final  . Lymphs Abs 02/28/2021 2.2  0.7 - 4.0 K/uL Final  . Monocytes Relative 02/28/2021 11  % Final  . Monocytes Absolute 02/28/2021 0.6  0.1 - 1.0 K/uL Final  . Eosinophils Relative 02/28/2021 3  % Final  . Eosinophils Absolute 02/28/2021 0.1  0.0 - 0.5 K/uL Final  . Basophils Relative 02/28/2021 0  % Final  . Basophils Absolute 02/28/2021 0.0  0.0 - 0.1 K/uL Final  . Immature Granulocytes 02/28/2021 0  % Final  . Abs Immature Granulocytes 02/28/2021 0.01  0.00 - 0.07 K/uL Final   Performed at [facility], [address] [place], [place] [address]  . Sodium 02/28/2021 139  135 - 145 mmol/L Final  . Potassium 02/28/2021 3.4 (A) 3.5 - 5.1 mmol/L Final  . Chloride 02/28/2021 102  98 - 111 mmol/L Final  . CO2 02/28/2021 29  22 - 32 mmol/L Final  . Glucose, Bld 02/28/2021 75  70 - 99 mg/dL Final   Glucose reference range applies only to samples taken after fasting for at least 8 hours.  . BUN 02/28/2021 17  6 - 20 mg/dL Final  . Creatinine, Ser 02/28/2021 0.60  0.44 - 1.00 mg/dL Final  . Calcium 49/44/9675 9.3  8.9 - 10.3 mg/dL Final  . Total Protein 02/28/2021  7.3  6.5 - 8.1 g/dL Final  . Albumin 32/10/2481 3.8  3.5 - 5.0 g/dL Final  . AST 50/01/7047 20  15 - 41 U/L Final  . ALT 02/28/2021 18  0 - 44 U/L Final  . Alkaline Phosphatase 02/28/2021 43  38 - 126 U/L Final  . Total Bilirubin 02/28/2021 0.6  0.3 - 1.2 mg/dL Final  . GFR, Estimated 02/28/2021 >60  >60 mL/min Final   Comment: (NOTE) Calculated using the CKD-EPI Creatinine Equation (2021)   . Anion gap 02/28/2021 8  5 - 15 Final   Performed at [facility], [address], [place], [place] [address]  . Hgb A1c MFr Bld 02/28/2021 5.1  4.8 - 5.6 % Final   Comment: (NOTE) Pre diabetes:          5.7%-6.4%  Diabetes:              >6.4%  Glycemic control for   <7.0% adults with diabetes    . Mean Plasma Glucose 02/28/2021 99.67  mg/dL Final   Performed at [facility], [address], [place], [place] [address]  . TSH 02/28/2021 2.782  0.350 - 4.500 uIU/mL Final   Comment: Performed by a 3rd Generation assay with a functional sensitivity of <=0.01 uIU/mL. Performed at [facility], [address] [place], [place] [address]   . POC Amphetamine UR 02/28/2021 None Detected  NONE DETECTED (Cut Off Level 1000 ng/mL) Final  . POC Secobarbital (BAR) 02/28/2021 None Detected  NONE DETECTED (Cut Off Level 300 ng/mL) Final  . POC Buprenorphine (BUP) 02/28/2021 None Detected  NONE DETECTED (Cut Off Level 10 ng/mL) Final  . POC Oxazepam (BZO) 02/28/2021 None Detected  NONE DETECTED (Cut Off Level 300 ng/mL) Final  . POC Cocaine UR 02/28/2021 None Detected  NONE DETECTED (Cut Off Level 300 ng/mL) Final  . POC Methamphetamine UR 02/28/2021 None Detected  NONE DETECTED (Cut Off Level 1000 ng/mL) Final  . POC Morphine 02/28/2021 None Detected  NONE DETECTED (Cut Off Level 300 ng/mL) Final  . POC Oxycodone UR 02/28/2021 None Detected  NONE DETECTED (Cut Off Level 100 ng/mL) Final  . POC Methadone UR 02/28/2021 None Detected  NONE DETECTED (Cut Off Level 300 ng/mL) Final  . POC Marijuana UR 02/28/2021 None Detected  NONE DETECTED (Cut Off Level 50 ng/mL) Final  . SARSCOV2ONAVIRUS 2 AG 02/28/2021 NEGATIVE  NEGATIVE Final   Comment: (NOTE) SARS-CoV-2 antigen NOT DETECTED.   Negative results are presumptive.  Negative results do not preclude SARS-CoV-2 infection and should not be used as the sole basis for treatment or other patient management decisions, including infection  control decisions, particularly in the presence of clinical signs and  symptoms consistent with COVID-19, or in those who have been in contact with the virus.  Negative results must be combined with clinical observations, patient history, and epidemiological information. The expected result is  Negative.  Fact Sheet for Patients: https://www.jennings-kim.com/  Fact Sheet for Healthcare Providers: https://alexander-rogers.biz/  This test is not yet approved or cleared by the [place] FDA and  has been authorized for detection and/or diagnosis of SARS-CoV-2 by FDA under an Emergency Use Authorization (EUA).  This EUA will remain in effect (meaning this test can be used) for the duration of  the COV                          ID-19 declaration under Section 564(b)(1) of the  Act, 21 U.S.C. section 360bbb-3(b)(1), unless the authorization is terminated or revoked sooner.    . Preg Test, Ur 02/28/2021 NEGATIVE  NEGATIVE Final   Comment:        THE SENSITIVITY OF THIS METHODOLOGY IS >24 mIU/mL   . Cholesterol 02/28/2021 138  0 - 200 mg/dL Final  . Triglycerides 02/28/2021 69  <150 mg/dL Final  . HDL 16/08/9603 50  >40 mg/dL Final  . Total CHOL/HDL Ratio 02/28/2021 2.8  RATIO Final  . VLDL 02/28/2021 14  0 - 40 mg/dL Final  . LDL Cholesterol 02/28/2021 74  0 - 99 mg/dL Final   Comment:        Total Cholesterol/HDL:CHD Risk Coronary Heart Disease Risk Table                     Men   Women  1/2 Average Risk   3.4   3.3  Average Risk       5.0   4.4  2 X Average Risk   9.6   7.1  3 X Average Risk  23.4   11.0        Use the calculated Patient Ratio above and the CHD Risk Table to determine the patient's CHD Risk.        ATP III CLASSIFICATION (LDL):  <100     mg/dL   Optimal  540-981  mg/dL   Near or Above                    Optimal  130-159  mg/dL   Borderline  191-478  mg/dL   High  >295     mg/dL   Very High Performed at [facility], [address] [place], [place] [address]     Allergies: Patient has no known allergies.  PTA Medications: (Not in a hospital admission)   Medical Decision Making  Patient is unable to contract for safety at this time. Patient will be admitted to [facility] for continuous assessment with  reassessment by psychiatry on dayshift on 04/30/2021.     Recommendations  Based on my evaluation the patient does not appear to have an emergency medical condition.  Maricela Bo, NP 05/01/21  12:30 AM

## 2021-05-01 NOTE — ED Provider Notes (Signed)
FBC/OBS ASAP Discharge Summary  Date and Time: 05/01/2021 1:06 PM  Name: [name] [name]  MRN:  [id number]   Discharge Diagnoses:  Final diagnoses:  Paranoid schizophrenia (HCC)    Subjective: "I am tired. I have been sleeping this morning."   Stay Summary: Patient seen and assessed face to face. She is alert and oriented x 4. Mood is euthymic and affect is congruent. Her thought process is logical and speech is coherent, decreased in volume. She is calm and cooperative. She denies suicidal ideations. She denies homicidal ideations. She denies auditory and visual hallucinations today. She denies feeling paranoid and does not feel like Jesus [name] [name] is harassing her or telling her to harm herself. She does not appear to be responding to internal or external stimuli. When asked if she feels like her medications are working, she nods her head yes. When asked if she's tolerating her medications well, she states yes in a low tone voice. She has been sleeping on the unit without any behavioral problems. She states that she lives alone in a house. When asked if she is followed by the ACT team, she states "yes [facility]." She states that she has an appointment with [facility] tomorrow and they will pick her up at 7:30 in the morning. When asked if she would like to notify [facility] to [facility], she states no because they get mad at her for coming here. She requested an uber because she doesn't feel comfortable riding the bus. Patient provided with a taxi voucher for transport by CSW.   Total Time spent with patient: 20 minutes  Past Psychiatric History: Paranoid Schizophrenia, Schizoaffective, Depression. Past Medical History:  Past Medical History:  Diagnosis Date   Arthritis    Depression    Hallucinations    Paranoid schizophrenia (HCC)    Schizoaffective disorder (HCC)    No past surgical history on file. Family History: No family history on file. Family  Psychiatric History: Unknown  Social History:  Social History   Substance and Sexual Activity  Alcohol Use Not Currently   Comment: She denies      Social History   Substance and Sexual Activity  Drug Use Not Currently   Comment: She denies     Social History   Socioeconomic History   Marital status: Single    Spouse name: Not on file   Number of children: Not on file   Years of education: Not on file   Highest education level: Not on file  Occupational History   Occupation: Disability  Tobacco Use   Smoking status: Every Day    Packs/day: 1.00    Pack years: 0.00    Types: Cigarettes   Smokeless tobacco: Never  Vaping Use   Vaping Use: Never used  Substance and Sexual Activity   Alcohol use: Not Currently    Comment: She denies    Drug use: Not Currently    Comment: She denies    Sexual activity: Not Currently  Other Topics Concern   Not on file  Social History Narrative   Pt stated that she [place] in [place], and that she lives alone.  Pt receives outpatient psychiatric resources through [facility].   Social Determinants of Health   Financial Resource Strain: Not on file  Food Insecurity: Not on file  Transportation Needs: Not on file  Physical Activity: Not on file  Stress: Not on file  Social Connections: Not on file   SDOH:  SDOH Screenings   Alcohol Screen: Low Risk    Last Alcohol Screening Score (AUDIT): 0  Depression (PHQ2-9): Medium Risk   PHQ-2 Score: 10  Financial Resource Strain: Not on file  Food Insecurity: Not on file  Housing: Not on file  Physical Activity: Not on file  Social Connections: Not on file  Stress: Not on file  Tobacco Use: High Risk   Smoking Tobacco Use: Every Day   Smokeless Tobacco Use: Never  Transportation Needs: Not on file    Tobacco Cessation:  N/A, patient does not currently use tobacco products  Current Medications:  Current Facility-Administered Medications  Medication Dose Route Frequency  Provider Last Rate Last Admin   acetaminophen (TYLENOL) tablet 650 mg  650 mg Oral Q6H PRN Ajibola, Ene A, NP       alum & mag hydroxide-simeth (MAALOX/MYLANTA) 200-200-20 MG/5ML suspension 30 mL  30 mL Oral Q4H PRN Ajibola, Ene A, NP       ascorbic acid (VITAMIN C) tablet 500 mg  500 mg Oral Daily Ajibola, Ene A, NP   500 mg at 05/01/21 2992   benztropine (COGENTIN) tablet 1 mg  1 mg Oral BID PRN Ajibola, Ene A, NP       cholecalciferol (VITAMIN D3) tablet 1,000 Units  1,000 Units Oral Daily Ajibola, Ene A, NP   1,000 Units at 05/01/21 0928   famotidine (PEPCID) tablet 20 mg  20 mg Oral BID Ajibola, Ene A, NP   20 mg at 05/01/21 4268   haloperidol (HALDOL) tablet 10 mg  10 mg Oral BID AC Ajibola, Ene A, NP   10 mg at 05/01/21 3419   haloperidol (HALDOL) tablet 15 mg  15 mg Oral QHS Ajibola, Ene A, NP   15 mg at 05/01/21 0158   hydrOXYzine (ATARAX/VISTARIL) tablet 25 mg  25 mg Oral TID PRN Ajibola, Ene A, NP       magnesium hydroxide (MILK OF MAGNESIA) suspension 30 mL  30 mL Oral Daily PRN Ajibola, Ene A, NP       risperiDONE (RISPERDAL M-TABS) disintegrating tablet 2 mg  2 mg Oral Daily Ajibola, Ene A, NP   2 mg at 05/01/21 6222   risperiDONE (RISPERDAL M-TABS) disintegrating tablet 6 mg  6 mg Oral QHS Ajibola, Ene A, NP   6 mg at 05/01/21 0155   traZODone (DESYREL) tablet 150 mg  150 mg Oral QHS Ajibola, Ene A, NP   150 mg at 05/01/21 0159   Current Outpatient Medications  Medication Sig Dispense Refill   benztropine (COGENTIN) 1 MG tablet Take 1 tablet (1 mg total) by mouth 2 (two) times daily as needed for tremors. 60 tablet 0   famotidine (PEPCID) 20 MG tablet Take 1 tablet (20 mg total) by mouth 2 (two) times daily. 60 tablet 0   haloperidol (HALDOL) 10 MG tablet Take 1 tablet (10 mg total) by mouth 2 (two) times daily. 60 tablet 0   haloperidol (HALDOL) 5 MG tablet Take 3 tablets (15 mg total) by mouth at bedtime. 30 tablet 0   risperiDONE (RISPERDAL M-TABS) 2 MG disintegrating tablet  Take 1 tablet (2 mg total) by mouth daily. 30 tablet 0   risperiDONE (RISPERDAL M-TABS) 3 MG disintegrating tablet Take 2 tablets (6 mg total) by mouth at bedtime. 30 tablet 0   traZODone (DESYREL) 150 MG tablet Take 1 tablet (150 mg total) by mouth at bedtime. 30 tablet 0   vitamin C (ASCORBIC ACID) 500 MG tablet Take 500 mg by  mouth daily.     Vitamin D, Cholecalciferol, 25 MCG (1000 UT) TABS Take 1 tablet by mouth daily.      PTA Medications: (Not in a hospital admission)   Musculoskeletal  Strength & Muscle Tone: within normal limits Gait & Station: normal Patient leans: N/A  Psychiatric Specialty Exam  Presentation  General Appearance: Appropriate for Environment  Eye Contact:Fair  Speech:Clear and Coherent  Speech Volume:Decreased  Handedness:Right   Mood and Affect  Mood:Euthymic  Affect:Appropriate   Thought Process  Thought Processes:Coherent  Descriptions of Associations:Intact  Orientation:Full (Time, Place and Person)  Thought Content:WDL  Diagnosis of Schizophrenia or Schizoaffective disorder in past: Yes  Duration of Psychotic Symptoms: Greater than six months   Hallucinations:Hallucinations: None Description of Auditory Hallucinations: see HPI  Ideas of Reference:None  Suicidal Thoughts:Suicidal Thoughts: No  Homicidal Thoughts:Homicidal Thoughts: No   Sensorium  Memory:Immediate Fair; Remote Fair; Recent Fair  Judgment:Poor  Insight:Fair   Executive Functions  Concentration:Fair  Attention Span:Fair  Recall:Fair  Fund of Knowledge:Fair  Language:Fair   Psychomotor Activity  Psychomotor Activity:Psychomotor Activity: Normal   Assets  Assets:Communication Skills; Housing; Desire for Improvement; Social Support   Sleep  Sleep:Sleep: Fair Number of Hours of Sleep: 5   Nutritional Assessment (For OBS and FBC admissions only) Has the patient had a weight loss or gain of 10 pounds or more in the last 3 months?: No Has  the patient had a decrease in food intake/or appetite?: No Does the patient have dental problems?: No Does the patient have eating habits or behaviors that may be indicators of an eating disorder including binging or inducing vomiting?: No Has the patient recently lost weight without trying?: No Has the patient been eating poorly because of a decreased appetite?: No Malnutrition Screening Tool Score: 0    Physical Exam  Physical Exam HENT:     Head: Normocephalic.  Eyes:     Conjunctiva/sclera: Conjunctivae normal.  Cardiovascular:     Rate and Rhythm: Normal rate.  Pulmonary:     Effort: Pulmonary effort is normal.  Musculoskeletal:        General: Normal range of motion.     Cervical back: Normal range of motion.  Neurological:     General: No focal deficit present.     Mental Status: She is alert and oriented to person, place, and time.   Review of Systems  Constitutional: Negative.   HENT: Negative.    Eyes: Negative.   Respiratory: Negative.    Cardiovascular: Negative.   Gastrointestinal: Negative.   Genitourinary: Negative.   Musculoskeletal: Negative.   Skin: Negative.   Neurological: Negative.   Endo/Heme/Allergies: Negative.   Psychiatric/Behavioral:  Negative for depression, hallucinations, substance abuse and suicidal ideas. The patient is not nervous/anxious.   Blood pressure 113/78, pulse 86, temperature 98.5 F (36.9 C), temperature source Oral, resp. rate 18, SpO2 100 %. There is no height or weight on file to calculate BMI.  Demographic Factors:  Low socioeconomic status and Living alone  Loss Factors: NA  Historical Factors: Impulsivity  Risk Reduction Factors:   Religious beliefs about death and Positive social support  Continued Clinical Symptoms:  Schizophrenia:   Paranoid or undifferentiated type  Cognitive Features That Contribute To Risk:  None    Suicide Risk:  Minimal: No identifiable suicidal ideation.  Patients presenting with  no risk factors but with morbid ruminations; may be classified as minimal risk based on the severity of the depressive symptoms  Plan Of Care/Follow-up recommendations:  Activity:  as tolerated   Take all of your current medications as prescribed by your mental healthcare provider [facility].Report any adverse effects and reactions from your medications to your outpatient provider promptly. Do not engage in alcohol and or illegal drug use while on prescription medicines.  Keep all scheduled appointments. This is to ensure that you are getting refills on time and to avoid any interruption in your medication.  If you are unable to keep an appointment call to reschedule. Be sure to follow up with resources and follow ups given. In the event of worsening symptoms call the crisis hotline, 911, and or go to the nearest emergency department for appropriate evaluation and treatment of symptoms.  Follow-up with your primary care provider for your medical issues, concerns and or health care needs.     Follow up with [facility] as scheduled on 05/02/21.  Disposition: Patient discharged home. Patient denies SI/HI/AVH. Patient does not meet criteria for inpatient treatment. Patient has ACT team and day program services. No medications changes during this encounter.     Haroun Cotham L, NP 05/01/2021, 1:06 PM

## 2021-05-01 NOTE — ED Notes (Signed)
Pt asleep in bed. Respirations even and unlabored. Will continue to monitor for safety. ?

## 2021-05-01 NOTE — ED Notes (Signed)
Resting with eyes closed. Rise and fall of chest noted. No new issues reported. Will continue to monitor for safety 

## 2021-05-01 NOTE — ED Notes (Signed)
Educated on d/c instructions and f/u care. Verbalized understanding. Ambulated per self to retrieve belongings. No SI, HI, or AVH. No s/s pain, discomfort, or acute distress noted. A&O x4. Escorted to front lobby and d/c. Transported home via taxi. Stable at time of d/c

## 2021-05-01 NOTE — Progress Notes (Signed)
CSW facilitated the patient be transported back to her residence upon discharge via taxi voucher, which was approved by weekend supervisor.   Crissie Reese, MSW, LCSW-A, LCAS-A Phone: [phone] Disposition/TOC

## 2021-05-01 NOTE — ED Notes (Addendum)
Pt admitted to continuous assessment due to AVH. Pt A&O x4, calm and cooperative but appears anxious at times. Pt frequently talks quietly to herself. Pt tolerated lab work and skin assessment well. Pt ambulated independently to unit. Oriented to unit/staff. Sandwich, nutrigrain bar, and grape juice given. Pt took medications without difficulty. No signs of acute distress noted. Will continue to monitor for safety.

## 2021-05-01 NOTE — Discharge Instructions (Addendum)
Follow up with [facility] on Monday, May 02, 2021 for follow up

## 2021-05-12 ENCOUNTER — Encounter (HOSPITAL_COMMUNITY): Payer: Self-pay | Admitting: Emergency Medicine

## 2021-05-12 ENCOUNTER — Emergency Department (HOSPITAL_COMMUNITY)
Admission: EM | Admit: 2021-05-12 | Discharge: 2021-05-13 | Disposition: A | Discharge status: Other Acute Inpatient Hospital | Payer: Medicare Other | Attending: Emergency Medicine | Admitting: Emergency Medicine

## 2021-05-12 DIAGNOSIS — F17210 Nicotine dependence, cigarettes, uncomplicated: Secondary | ICD-10-CM | POA: Diagnosis not present

## 2021-05-12 DIAGNOSIS — R4689 Other symptoms and signs involving appearance and behavior: Secondary | ICD-10-CM | POA: Insufficient documentation

## 2021-05-12 DIAGNOSIS — Z046 Encounter for general psychiatric examination, requested by authority: Secondary | ICD-10-CM | POA: Diagnosis present

## 2021-05-12 DIAGNOSIS — F259 Schizoaffective disorder, unspecified: Secondary | ICD-10-CM | POA: Insufficient documentation

## 2021-05-12 DIAGNOSIS — F209 Schizophrenia, unspecified: Secondary | ICD-10-CM | POA: Diagnosis not present

## 2021-05-12 DIAGNOSIS — R443 Hallucinations, unspecified: Secondary | ICD-10-CM | POA: Insufficient documentation

## 2021-05-12 DIAGNOSIS — Y900 Blood alcohol level of less than 20 mg/100 ml: Secondary | ICD-10-CM | POA: Insufficient documentation

## 2021-05-12 DIAGNOSIS — Z20822 Contact with and (suspected) exposure to covid-19: Secondary | ICD-10-CM | POA: Diagnosis not present

## 2021-05-12 DIAGNOSIS — R456 Violent behavior: Secondary | ICD-10-CM | POA: Diagnosis not present

## 2021-05-12 LAB — CBC WITH DIFFERENTIAL/PLATELET
Abs Immature Granulocytes: 0.01 10*3/uL (ref 0.00–0.07)
Basophils Absolute: 0 10*3/uL (ref 0.0–0.1)
Basophils Relative: 0 %
Eosinophils Absolute: 0 10*3/uL (ref 0.0–0.5)
Eosinophils Relative: 0 %
HCT: 37.1 % (ref 36.0–46.0)
Hemoglobin: 11.8 g/dL — ABNORMAL LOW (ref 12.0–15.0)
Immature Granulocytes: 0 %
Lymphocytes Relative: 25 %
Lymphs Abs: 1.2 10*3/uL (ref 0.7–4.0)
MCH: 28.5 pg (ref 26.0–34.0)
MCHC: 31.8 g/dL (ref 30.0–36.0)
MCV: 89.6 fL (ref 80.0–100.0)
Monocytes Absolute: 0.3 10*3/uL (ref 0.1–1.0)
Monocytes Relative: 8 %
Neutro Abs: 3 10*3/uL (ref 1.7–7.7)
Neutrophils Relative %: 67 %
Platelets: 340 10*3/uL (ref 150–400)
RBC: 4.14 MIL/uL (ref 3.87–5.11)
RDW: 13.7 % (ref 11.5–15.5)
WBC: 4.5 10*3/uL (ref 4.0–10.5)
nRBC: 0 % (ref 0.0–0.2)

## 2021-05-12 LAB — RAPID URINE DRUG SCREEN, HOSP PERFORMED
Amphetamines: NOT DETECTED
Barbiturates: NOT DETECTED
Benzodiazepines: NOT DETECTED
Cocaine: NOT DETECTED
Opiates: NOT DETECTED
Tetrahydrocannabinol: NOT DETECTED

## 2021-05-12 LAB — COMPREHENSIVE METABOLIC PANEL
ALT: 15 U/L (ref 0–44)
AST: 23 U/L (ref 15–41)
Albumin: 4.3 g/dL (ref 3.5–5.0)
Alkaline Phosphatase: 45 U/L (ref 38–126)
Anion gap: 9 (ref 5–15)
BUN: 8 mg/dL (ref 6–20)
CO2: 26 mmol/L (ref 22–32)
Calcium: 9.6 mg/dL (ref 8.9–10.3)
Chloride: 103 mmol/L (ref 98–111)
Creatinine, Ser: 0.7 mg/dL (ref 0.44–1.00)
GFR, Estimated: >60 mL/min (ref >60)
Glucose, Bld: 101 mg/dL — ABNORMAL HIGH (ref 70–99)
Potassium: 3.6 mmol/L (ref 3.5–5.1)
Sodium: 138 mmol/L (ref 135–145)
Total Bilirubin: 0.6 mg/dL (ref 0.3–1.2)
Total Protein: 7.4 g/dL (ref 6.5–8.1)

## 2021-05-12 LAB — ACETAMINOPHEN LEVEL: Acetaminophen (Tylenol), Serum: <10 ug/mL — ABNORMAL LOW (ref 10–30)

## 2021-05-12 LAB — I-STAT BETA HCG BLOOD, ED (MC, WL, AP ONLY): I-stat hCG, quantitative: <5 m[IU]/mL (ref <5)

## 2021-05-12 LAB — ETHANOL: Alcohol, Ethyl (B): <10 mg/dL (ref <10)

## 2021-05-12 LAB — SALICYLATE LEVEL: Salicylate Lvl: <7 mg/dL — ABNORMAL LOW (ref 7.0–30.0)

## 2021-05-12 LAB — SARS CORONAVIRUS 2 (TAT 6-24 HRS): SARS Coronavirus 2: NEGATIVE

## 2021-05-12 MED ORDER — ZIPRASIDONE MESYLATE 20 MG IM SOLR
20.0000 mg | Freq: Two times a day (BID) | INTRAMUSCULAR | Status: DC | PRN
  Administered 2021-05-12: 20 mg via INTRAMUSCULAR
  Filled 2021-05-12: qty 20

## 2021-05-12 MED ORDER — LORAZEPAM 1 MG PO TABS: 1.0000 mg | ORAL_TABLET | ORAL | Status: DC | PRN
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
## 2021-05-12 NOTE — ED Notes (Signed)
3 copies of IVC paperwork handed to RN alana ,original copy put into red folder. One copy into medical records

## 2021-05-12 NOTE — Progress Notes (Signed)
Patient meets inpatient criteria per Melbourne Abts, PA. Patient referred to the following facilities:  CSW will continue to monitor disposition.   Destination  Service Provider Address Phone Fax  [facility]  [address] [place] [address] [phone]  [facility]  [address] [place] [address] [phone]  [facility] [address]  [address], [place] [address] [phone]  [facility]  [address], [place] [address] [phone]  [facility]  [address] [place] [address] [phone]  [facility]  [address] [place] [address] [phone]  [facility]  [address] [place] [address] [phone]  [facility]  [address], [place] [address] [phone]  [facility]  [address] [place] [address] [phone]  [facility]  [address], [place] [address] [phone]  [facility]  [address] [place] [address] [phone]  [facility]  [address] [place] [address] [phone]    Signed:  Corky Crafts, MSW, [place], LCASA 05/12/2021 9:44 PM

## 2021-05-12 NOTE — ED Notes (Signed)
TTS in progress 

## 2021-05-12 NOTE — ED Notes (Signed)
Patient refused Vitals and EKG.
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
## 2021-05-12 NOTE — ED Notes (Signed)
Patient in room screaming stating, "I'm going to die."
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
## 2021-05-12 NOTE — ED Notes (Signed)
Patient refused EKG.  Will attempt again shortly.

## 2021-05-13 DIAGNOSIS — F203 Undifferentiated schizophrenia: Secondary | ICD-10-CM | POA: Diagnosis not present

## 2021-05-13 DIAGNOSIS — Z20822 Contact with and (suspected) exposure to covid-19: Secondary | ICD-10-CM | POA: Diagnosis not present

## 2021-05-13 DIAGNOSIS — Z9114 Patient's other noncompliance with medication regimen: Secondary | ICD-10-CM | POA: Diagnosis not present

## 2021-05-13 DIAGNOSIS — K219 Gastro-esophageal reflux disease without esophagitis: Secondary | ICD-10-CM | POA: Diagnosis present

## 2021-05-13 DIAGNOSIS — Z62811 Personal history of psychological abuse in childhood: Secondary | ICD-10-CM | POA: Diagnosis present

## 2021-05-13 DIAGNOSIS — F259 Schizoaffective disorder, unspecified: Secondary | ICD-10-CM | POA: Diagnosis not present

## 2021-05-13 DIAGNOSIS — R456 Violent behavior: Secondary | ICD-10-CM | POA: Diagnosis not present

## 2021-05-13 DIAGNOSIS — G4700 Insomnia, unspecified: Secondary | ICD-10-CM | POA: Diagnosis present

## 2021-05-13 DIAGNOSIS — F200 Paranoid schizophrenia: Secondary | ICD-10-CM | POA: Diagnosis not present

## 2021-05-13 DIAGNOSIS — F209 Schizophrenia, unspecified: Secondary | ICD-10-CM | POA: Diagnosis not present

## 2021-05-13 DIAGNOSIS — R443 Hallucinations, unspecified: Secondary | ICD-10-CM | POA: Diagnosis not present

## 2021-05-13 DIAGNOSIS — F17210 Nicotine dependence, cigarettes, uncomplicated: Secondary | ICD-10-CM | POA: Diagnosis not present

## 2021-05-13 MED ORDER — LORAZEPAM 2 MG/ML IJ SOLN
2.0000 mg | Freq: Once | INTRAMUSCULAR | Status: AC
  Administered 2021-05-13: 2 mg via INTRAMUSCULAR
  Filled 2021-05-13: qty 1

## 2021-05-13 NOTE — Progress Notes (Signed)
Received call from Pax, MSW at [facility].  Patient has been accepted to [facility] and can arrive after 8am.  Accepting is Dr Estill Cotta. The number to call report is [phone].

## 2021-05-13 NOTE — ED Notes (Signed)
Report called to Marena Chancy, Charity fundraiser at [facility].
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
## 2021-05-13 NOTE — ED Provider Notes (Signed)
Emergency Medicine Observation Re-evaluation Note  [name] [name] is a 45 y.o. female, seen on rounds today.  Pt initially presented to the ED for complaints of No chief complaint on file. Currently, the patient is calm, comfortable.  Physical Exam  BP 114/75   Pulse 82   Temp 97.9 F (36.6 C) (Oral)   Resp 16   SpO2 99%  Physical Exam General: sleeping comfortably   ED Course / MDM  EKG:   I have reviewed the labs performed to date as well as medications administered while in observation.  Recent changes in the last 24 hours include agitation requiring medication.  Plan  Current plan is for transfer to [facility] accepting). Patient is under full IVC at this time.   Wynetta Fines, MD 05/13/21 [phone]
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
## 2021-05-13 NOTE — ED Notes (Signed)
Sheriff called for transport at [phone].  Left VM for call back.

## 2021-06-04 ENCOUNTER — Encounter (HOSPITAL_COMMUNITY): Payer: Self-pay

## 2021-06-04 ENCOUNTER — Ambulatory Visit (HOSPITAL_COMMUNITY)
Admission: EM | Admit: 2021-06-04 | Discharge: 2021-06-05 | Disposition: A | Discharge status: Home / Self Care | Payer: Medicare Other | Attending: Psychiatry | Admitting: Psychiatry

## 2021-06-04 ENCOUNTER — Other Ambulatory Visit: Payer: Self-pay

## 2021-06-04 DIAGNOSIS — R45851 Suicidal ideations: Secondary | ICD-10-CM | POA: Diagnosis not present

## 2021-06-04 DIAGNOSIS — Z9114 Patient's other noncompliance with medication regimen: Secondary | ICD-10-CM | POA: Insufficient documentation

## 2021-06-04 DIAGNOSIS — Z79899 Other long term (current) drug therapy: Secondary | ICD-10-CM | POA: Insufficient documentation

## 2021-06-04 DIAGNOSIS — F201 Disorganized schizophrenia: Secondary | ICD-10-CM

## 2021-06-04 DIAGNOSIS — Z20822 Contact with and (suspected) exposure to covid-19: Secondary | ICD-10-CM | POA: Insufficient documentation

## 2021-06-04 DIAGNOSIS — F17210 Nicotine dependence, cigarettes, uncomplicated: Secondary | ICD-10-CM | POA: Insufficient documentation

## 2021-06-04 DIAGNOSIS — F200 Paranoid schizophrenia: Secondary | ICD-10-CM | POA: Diagnosis not present

## 2021-06-04 LAB — COMPREHENSIVE METABOLIC PANEL
ALT: 18 U/L (ref 0–44)
AST: 23 U/L (ref 15–41)
Albumin: 4.5 g/dL (ref 3.5–5.0)
Alkaline Phosphatase: 45 U/L (ref 38–126)
Anion gap: 8 (ref 5–15)
BUN: 12 mg/dL (ref 6–20)
CO2: 27 mmol/L (ref 22–32)
Calcium: 9.5 mg/dL (ref 8.9–10.3)
Chloride: 99 mmol/L (ref 98–111)
Creatinine, Ser: 0.71 mg/dL (ref 0.44–1.00)
GFR, Estimated: >60 mL/min (ref >60)
Glucose, Bld: 89 mg/dL (ref 70–99)
Potassium: 3.7 mmol/L (ref 3.5–5.1)
Sodium: 134 mmol/L — ABNORMAL LOW (ref 135–145)
Total Bilirubin: 1.1 mg/dL (ref 0.3–1.2)
Total Protein: 7.8 g/dL (ref 6.5–8.1)

## 2021-06-04 LAB — CBC WITH DIFFERENTIAL/PLATELET
Abs Immature Granulocytes: 0.01 10*3/uL (ref 0.00–0.07)
Basophils Absolute: 0 10*3/uL (ref 0.0–0.1)
Basophils Relative: 1 %
Eosinophils Absolute: 0 10*3/uL (ref 0.0–0.5)
Eosinophils Relative: 1 %
HCT: 37.9 % (ref 36.0–46.0)
Hemoglobin: 12.2 g/dL (ref 12.0–15.0)
Immature Granulocytes: 0 %
Lymphocytes Relative: 41 %
Lymphs Abs: 1.4 10*3/uL (ref 0.7–4.0)
MCH: 28.9 pg (ref 26.0–34.0)
MCHC: 32.2 g/dL (ref 30.0–36.0)
MCV: 89.8 fL (ref 80.0–100.0)
Monocytes Absolute: 0.3 10*3/uL (ref 0.1–1.0)
Monocytes Relative: 10 %
Neutro Abs: 1.6 10*3/uL — ABNORMAL LOW (ref 1.7–7.7)
Neutrophils Relative %: 47 %
Platelets: 314 10*3/uL (ref 150–400)
RBC: 4.22 MIL/uL (ref 3.87–5.11)
RDW: 13.3 % (ref 11.5–15.5)
WBC: 3.4 10*3/uL — ABNORMAL LOW (ref 4.0–10.5)
nRBC: 0 % (ref 0.0–0.2)

## 2021-06-04 LAB — POCT URINE DRUG SCREEN - MANUAL ENTRY (I-SCREEN)
POC Amphetamine UR: NOT DETECTED
POC Buprenorphine (BUP): NOT DETECTED
POC Cocaine UR: NOT DETECTED
POC Marijuana UR: NOT DETECTED
POC Methadone UR: NOT DETECTED
POC Methamphetamine UR: NOT DETECTED
POC Morphine: NOT DETECTED
POC Oxazepam (BZO): NOT DETECTED
POC Oxycodone UR: NOT DETECTED
POC Secobarbital (BAR): NOT DETECTED

## 2021-06-04 LAB — POC SARS CORONAVIRUS 2 AG -  ED: SARS Coronavirus 2 Ag: NEGATIVE

## 2021-06-04 LAB — LIPID PANEL
Cholesterol: 156 mg/dL (ref 0–200)
HDL: 47 mg/dL (ref >40)
LDL Cholesterol: 95 mg/dL (ref 0–99)
Total CHOL/HDL Ratio: 3.3 RATIO
Triglycerides: 69 mg/dL (ref <150)
VLDL: 14 mg/dL (ref 0–40)

## 2021-06-04 LAB — TSH: TSH: 1.041 u[IU]/mL (ref 0.350–4.500)

## 2021-06-04 LAB — HEMOGLOBIN A1C
Hgb A1c MFr Bld: 5 % (ref 4.8–5.6)
Mean Plasma Glucose: 96.8 mg/dL

## 2021-06-04 LAB — ETHANOL: Alcohol, Ethyl (B): <10 mg/dL (ref <10)

## 2021-06-04 LAB — RESP PANEL BY RT-PCR (FLU A&B, COVID) ARPGX2
Influenza A by PCR: NEGATIVE
Influenza B by PCR: NEGATIVE
SARS Coronavirus 2 by RT PCR: NEGATIVE

## 2021-06-04 MED ORDER — ASCORBIC ACID 500 MG PO TABS
500.0000 mg | ORAL_TABLET | Freq: Every day | ORAL | Status: DC
  Administered 2021-06-04 – 2021-06-05 (×2): 500 mg via ORAL
  Filled 2021-06-04 (×2): qty 1
  Filled 2021-06-04: qty 7

## 2021-06-04 MED ORDER — RISPERIDONE 2 MG PO TBDP
6.0000 mg | ORAL_TABLET | Freq: Every day | ORAL | Status: DC
  Administered 2021-06-04: 6 mg via ORAL
  Filled 2021-06-04: qty 21
  Filled 2021-06-04: qty 3

## 2021-06-04 MED ORDER — RISPERIDONE 2 MG PO TBDP
2.0000 mg | ORAL_TABLET | Freq: Every day | ORAL | Status: DC
  Administered 2021-06-04 – 2021-06-05 (×2): 2 mg via ORAL
  Filled 2021-06-04: qty 28
  Filled 2021-06-04 (×2): qty 1

## 2021-06-04 MED ORDER — HYDROXYZINE HCL 25 MG PO TABS: 25.0000 mg | ORAL_TABLET | Freq: Three times a day (TID) | ORAL | Status: DC | PRN

## 2021-06-04 MED ORDER — TRAZODONE HCL 150 MG PO TABS
150.0000 mg | ORAL_TABLET | Freq: Every day | ORAL | Status: DC
  Administered 2021-06-04: 150 mg via ORAL
  Filled 2021-06-04: qty 1
  Filled 2021-06-04: qty 7

## 2021-06-04 MED ORDER — HALOPERIDOL 5 MG PO TABS
15.0000 mg | ORAL_TABLET | Freq: Every day | ORAL | Status: DC
  Administered 2021-06-04: 15 mg via ORAL
  Filled 2021-06-04: qty 3

## 2021-06-04 MED ORDER — FAMOTIDINE 20 MG PO TABS
20.0000 mg | ORAL_TABLET | Freq: Two times a day (BID) | ORAL | Status: DC
  Administered 2021-06-04 – 2021-06-05 (×3): 20 mg via ORAL
  Filled 2021-06-04 (×2): qty 1
  Filled 2021-06-04: qty 14
  Filled 2021-06-04: qty 1

## 2021-06-04 MED ORDER — MAGNESIUM HYDROXIDE 400 MG/5ML PO SUSP: 30.0000 mL | Freq: Every day | ORAL | Status: DC | PRN

## 2021-06-04 MED ORDER — HALOPERIDOL 5 MG PO TABS
10.0000 mg | ORAL_TABLET | Freq: Two times a day (BID) | ORAL | Status: DC
  Administered 2021-06-04 – 2021-06-05 (×2): 10 mg via ORAL
  Filled 2021-06-04 (×3): qty 2

## 2021-06-04 MED ORDER — ACETAMINOPHEN 325 MG PO TABS: 650.0000 mg | ORAL_TABLET | Freq: Four times a day (QID) | ORAL | Status: DC | PRN

## 2021-06-04 MED ORDER — BENZTROPINE MESYLATE 1 MG PO TABS
1.0000 mg | ORAL_TABLET | Freq: Two times a day (BID) | ORAL | Status: DC | PRN
  Filled 2021-06-04: qty 14

## 2021-06-04 MED ORDER — VITAMIN D (CHOLECALCIFEROL) 25 MCG (1000 UT) PO TABS: 1.0000 | ORAL_TABLET | Freq: Every day | ORAL | Status: DC

## 2021-06-04 MED ORDER — VITAMIN D 25 MCG (1000 UNIT) PO TABS
1000.0000 [IU] | ORAL_TABLET | Freq: Every day | ORAL | Status: DC
  Administered 2021-06-04 – 2021-06-05 (×2): 1000 [IU] via ORAL
  Filled 2021-06-04: qty 7
  Filled 2021-06-04 (×2): qty 1

## 2021-06-04 MED ORDER — ALUM & MAG HYDROXIDE-SIMETH 200-200-20 MG/5ML PO SUSP: 30.0000 mL | ORAL | Status: DC | PRN

## 2021-06-04 NOTE — ED Notes (Signed)
Patient in bed resting.  Pt has equal chest rise and fall and breathing is WNL.  Occasionally calling out or moaning, denies pain.  Will continue to monitor for safety.

## 2021-06-04 NOTE — ED Provider Notes (Addendum)
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 03/12/2021 0-5  0 - 5 Final   Performed at [facility], [address], [place], [place] [address]  Admission on 03/08/2021, Discharged on 03/09/2021  Component Date Value Ref Range Status  . SARS Coronavirus 2 by RT PCR 03/08/2021 NEGATIVE  NEGATIVE Final   Comment: (NOTE) SARS-CoV-2 target nucleic acids are NOT DETECTED.  The SARS-CoV-2 RNA is generally detectable in upper  respiratory specimens during the acute phase of infection. The lowest concentration of SARS-CoV-2 viral copies this  assay can detect is 138 copies/mL. A negative result does not preclude SARS-Cov-2 infection and should not be used as the sole basis for treatment or other patient management decisions. A negative result may occur with  improper specimen collection/handling, submission of specimen other than nasopharyngeal swab, presence of viral mutation(s) within the areas targeted by this assay, and inadequate number of viral copies(<138 copies/mL). A negative result must be combined with clinical observations, patient history, and epidemiological information. The expected result is Negative.  Fact Sheet for Patients:  BloggerCourse.com  Fact Sheet for Healthcare Providers:  SeriousBroker.it  This test is no                          t yet approved or cleared by the [place] FDA and  has been authorized for detection and/or diagnosis of SARS-CoV-2 by FDA under an Emergency Use Authorization (EUA). This EUA will remain  in effect (meaning this test can be used) for the duration of the COVID-19 declaration under Section 564(b)(1) of the Act, 21 U.S.C.section 360bbb-3(b)(1), unless the authorization is terminated  or revoked sooner.      . Influenza A by PCR 03/08/2021 NEGATIVE  NEGATIVE Final  . Influenza B by PCR 03/08/2021 NEGATIVE  NEGATIVE Final   Comment: (NOTE) The Xpert Xpress SARS-CoV-2/FLU/RSV plus assay is intended as an aid in the diagnosis of influenza from Nasopharyngeal swab specimens and should not be used as a sole basis for treatment. Nasal washings and aspirates are unacceptable for Xpert Xpress SARS-CoV-2/FLU/RSV testing.  Fact Sheet for Patients: BloggerCourse.com  Fact Sheet for Healthcare Providers: SeriousBroker.it  This test is not yet approved or  cleared by the [place] FDA and has been authorized for detection and/or diagnosis of SARS-CoV-2 by FDA under an Emergency Use Authorization (EUA). This EUA will remain in effect (meaning this test can be used) for the duration of the COVID-19 declaration under Section 564(b)(1) of the Act, 21 U.S.C. section 360bbb-3(b)(1), unless the authorization is terminated or revoked.  Performed at [facility], [address], [place], [place] [address]   . SARS Coronavirus 2 Ag 03/08/2021 Negative  Negative Preliminary  . WBC 03/08/2021 4.0  4.0 - 10.5 K/uL Final  . RBC 03/08/2021 3.94  3.87 - 5.11 MIL/uL Final  . Hemoglobin 03/08/2021 11.3 (A) 12.0 - 15.0 g/dL Final  . HCT 18/56/3149 35.1 (A) 36.0 - 46.0 % Final  . MCV 03/08/2021 89.1  80.0 - 100.0 fL Final  . MCH 03/08/2021 28.7  26.0 - 34.0 pg Final  . MCHC 03/08/2021 32.2  30.0 - 36.0 g/dL Final  . RDW 70/26/3785 14.0  11.5 - 15.5 % Final  . Platelets 03/08/2021 325  150 - 400 K/uL Final  . nRBC 03/08/2021 0.0  0.0 - 0.2 % Final  . Neutrophils Relative % 03/08/2021 40  % Final  . Neutro Abs 03/08/2021 1.6 (A) 1.7 - 7.7 K/uL Final  . Lymphocytes Relative 03/08/2021 48  % Final  . Lymphs Abs 03/08/2021 2.0  0.7 - 4.0 K/uL Final  . Monocytes Relative 03/08/2021 9  % Final  . Monocytes Absolute 03/08/2021 0.4  0.1 - 1.0 K/uL Final  . Eosinophils Relative 03/08/2021 2  % Final  . Eosinophils Absolute 03/08/2021 0.1  0.0 - 0.5 K/uL Final  . Basophils Relative 03/08/2021 1  % Final  . Basophils Absolute 03/08/2021 0.0  0.0 - 0.1 K/uL Final  . Immature Granulocytes 03/08/2021 0  %  Final  . Abs Immature Granulocytes 03/08/2021 0.01  0.00 - 0.07 K/uL Final   Performed at [facility], [address], [place], [place] [address]  . POC Amphetamine UR 03/08/2021 None Detected  NONE DETECTED (Cut Off Level 1000 ng/mL) Final  . POC Secobarbital (BAR) 03/08/2021 None Detected  NONE DETECTED (Cut Off Level 300 ng/mL) Final  . POC  Buprenorphine (BUP) 03/08/2021 None Detected  NONE DETECTED (Cut Off Level 10 ng/mL) Final  . POC Oxazepam (BZO) 03/08/2021 None Detected  NONE DETECTED (Cut Off Level 300 ng/mL) Final  . POC Cocaine UR 03/08/2021 None Detected  NONE DETECTED (Cut Off Level 300 ng/mL) Final  . POC Methamphetamine UR 03/08/2021 None Detected  NONE DETECTED (Cut Off Level 1000 ng/mL) Final  . POC Morphine 03/08/2021 None Detected  NONE DETECTED (Cut Off Level 300 ng/mL) Final  . POC Oxycodone UR 03/08/2021 None Detected  NONE DETECTED (Cut Off Level 100 ng/mL) Final  . POC Methadone UR 03/08/2021 None Detected  NONE DETECTED (Cut Off Level 300 ng/mL) Final  . POC Marijuana UR 03/08/2021 None Detected  NONE DETECTED (Cut Off Level 50 ng/mL) Final  . Preg Test, Ur 03/08/2021 NEGATIVE  NEGATIVE Final   Comment:        THE SENSITIVITY OF THIS METHODOLOGY IS >24 mIU/mL   . Preg Test, Ur 03/08/2021 NEGATIVE  NEGATIVE Final   Comment:        THE SENSITIVITY OF THIS METHODOLOGY IS >24 mIU/mL   Admission on 03/07/2021, Discharged on 03/07/2021  Component Date Value Ref Range Status  . Sodium 03/07/2021 138  135 - 145 mmol/L Final  . Potassium 03/07/2021 3.8  3.5 - 5.1 mmol/L Final  . Chloride 03/07/2021 104  98 - 111 mmol/L Final  . CO2 03/07/2021 28  22 - 32 mmol/L Final  . Glucose, Bld 03/07/2021 87  70 - 99 mg/dL Final   Glucose reference range applies only to samples taken after fasting for at least 8 hours.  . BUN 03/07/2021 16  6 - 20 mg/dL Final  . Creatinine, Ser 03/07/2021 0.70  0.44 - 1.00 mg/dL Final  . Calcium 85/63/1497 9.4  8.9 - 10.3 mg/dL Final  . Total Protein 03/07/2021 7.0  6.5 - 8.1 g/dL Final  . Albumin 02/63/7858 3.9  3.5 - 5.0 g/dL Final  . AST 85/12/7739 20  15 - 41 U/L Final  . ALT 03/07/2021 15  0 - 44 U/L Final  . Alkaline Phosphatase 03/07/2021 42  38 - 126 U/L Final  . Total Bilirubin 03/07/2021 0.7  0.3 - 1.2 mg/dL Final  . GFR, Estimated 03/07/2021 >60  >60 mL/min Final    Comment: (NOTE) Calculated using the CKD-EPI Creatinine Equation (2021)   . Anion gap 03/07/2021 6  5 - 15 Final   Performed at [facility], [address], [place], [place] [address]  . WBC 03/07/2021 3.3 (A) 4.0 - 10.5 K/uL Final  . RBC 03/07/2021 3.87  3.87 - 5.11 MIL/uL Final  . Hemoglobin 03/07/2021 11.0 (A) 12.0 - 15.0 g/dL Final  . HCT 76/72/0947 34.6 (A) 36.0 - 46.0 % Final  . MCV 03/07/2021 89.4  80.0 - 100.0 fL Final  . MCH 03/07/2021 28.4  26.0 - 34.0 pg Final  . MCHC 03/07/2021 31.8  30.0 - 36.0 g/dL Final  . RDW 09/62/8366 14.0  11.5 - 15.5 % Final  . Platelets 03/07/2021 310  150 - 400 K/uL Final  . nRBC 03/07/2021 0.0  0.0 - 0.2 % Final  . Neutrophils Relative % 03/07/2021 40  % Final  . Neutro Abs 03/07/2021 1.3 (A) 1.7 - 7.7 K/uL Final  . Lymphocytes Relative 03/07/2021 46  % Final  . Lymphs Abs 03/07/2021 1.5  0.7 - 4.0 K/uL Final  . Monocytes Relative 03/07/2021 12  % Final  . Monocytes Absolute 03/07/2021 0.4  0.1 - 1.0 K/uL Final  . Eosinophils Relative 03/07/2021 2  % Final  . Eosinophils Absolute 03/07/2021 0.1  0.0 - 0.5 K/uL Final  . Basophils Relative 03/07/2021 0  % Final  . Basophils Absolute 03/07/2021 0.0  0.0 - 0.1 K/uL Final  . Immature Granulocytes 03/07/2021 0  % Final  . Abs Immature Granulocytes 03/07/2021 0.00  0.00 - 0.07 K/uL Final   Performed at [facility], [address], [place], [place] [address]  . Lipase 03/07/2021 32  11 - 51 U/L Final   Performed at [facility], [address] [place], [place] [address]  Admission on 03/07/2021, Discharged on 03/07/2021  Component Date Value Ref Range Status  . I-stat hCG, quantitative 03/07/2021 <5.0  <5 mIU/mL Final  . Comment 3 03/07/2021          Final   Comment:   GEST. AGE      CONC.  (mIU/mL)   <=1 WEEK        5 - 50     2 WEEKS       50 - 500     3 WEEKS       100 - 10,000     4 WEEKS     1,000 - 30,000        FEMALE AND NON-PREGNANT FEMALE:     LESS THAN 5 mIU/mL    Admission on 02/28/2021, Discharged on 03/02/2021  Component Date Value Ref Range Status  . SARS Coronavirus 2 by RT PCR 02/28/2021 NEGATIVE  NEGATIVE Final   Comment: (NOTE) SARS-CoV-2 target nucleic acids are NOT DETECTED.  The SARS-CoV-2 RNA is generally detectable in upper respiratory specimens during the acute phase of infection. The lowest concentration of SARS-CoV-2 viral copies this assay can detect is 138 copies/mL. A negative result does not preclude SARS-Cov-2 infection and should not be used as the sole basis for treatment or other patient management decisions. A negative result may occur with  improper specimen collection/handling, submission of specimen other than nasopharyngeal swab, presence of viral mutation(s) within the areas targeted by this assay, and inadequate number of viral copies(<138 copies/mL). A negative result must be combined with clinical observations, patient history, and epidemiological information. The expected result is Negative.  Fact Sheet for Patients:  BloggerCourse.com  Fact Sheet for Healthcare Providers:  SeriousBroker.it  This test is no                          t yet approved or cleared by the [place] FDA and  has been authorized for detection and/or diagnosis of SARS-CoV-2 by FDA under an Emergency Use Authorization (EUA). This EUA will remain  in effect (meaning this test can be used) for the duration of the COVID-19 declaration under Section 564(b)(1) of the Act, 21 U.S.C.section 360bbb-3(b)(1), unless the authorization is terminated  or revoked sooner.      . Influenza A by PCR 02/28/2021 NEGATIVE  NEGATIVE Final  . Influenza B by PCR 02/28/2021 NEGATIVE  NEGATIVE Final   Comment: (NOTE) The Xpert Xpress SARS-CoV-2/FLU/RSV  plus assay is intended as an aid in the diagnosis of influenza from Nasopharyngeal swab specimens and should not be used as a sole basis for treatment.  Nasal washings and aspirates are unacceptable for Xpert Xpress SARS-CoV-2/FLU/RSV testing.  Fact Sheet for Patients: BloggerCourse.com  Fact Sheet for Healthcare Providers: SeriousBroker.it  This test is not yet approved or cleared by the [place] FDA and has been authorized for detection and/or diagnosis of SARS-CoV-2 by FDA under an Emergency Use Authorization (EUA). This EUA will remain in effect (meaning this test can be used) for the duration of the COVID-19 declaration under Section 564(b)(1) of the Act, 21 U.S.C. section 360bbb-3(b)(1), unless the authorization is terminated or revoked.  Performed at [facility], [address], [place], [place] [address]   . WBC 02/28/2021 5.0  4.0 - 10.5 K/uL Final  . RBC 02/28/2021 3.90  3.87 - 5.11 MIL/uL Final  . Hemoglobin 02/28/2021 11.0 (A) 12.0 - 15.0 g/dL Final  . HCT 25/36/6440 34.8 (A) 36.0 - 46.0 % Final  . MCV 02/28/2021 89.2  80.0 - 100.0 fL Final  . MCH 02/28/2021 28.2  26.0 - 34.0 pg Final  . MCHC 02/28/2021 31.6  30.0 - 36.0 g/dL Final  . RDW 34/74/2595 13.9  11.5 - 15.5 % Final  . Platelets 02/28/2021 374  150 - 400 K/uL Final  . nRBC 02/28/2021 0.0  0.0 - 0.2 % Final  . Neutrophils Relative % 02/28/2021 43  % Final  . Neutro Abs 02/28/2021 2.2  1.7 - 7.7 K/uL Final  . Lymphocytes Relative 02/28/2021 43  % Final  . Lymphs Abs 02/28/2021 2.2  0.7 - 4.0 K/uL Final  . Monocytes Relative 02/28/2021 11  % Final  . Monocytes Absolute 02/28/2021 0.6  0.1 - 1.0 K/uL Final  . Eosinophils Relative 02/28/2021 3  % Final  . Eosinophils Absolute 02/28/2021 0.1  0.0 - 0.5 K/uL Final  . Basophils Relative 02/28/2021 0  % Final  . Basophils Absolute 02/28/2021 0.0  0.0 - 0.1 K/uL Final  . Immature Granulocytes 02/28/2021 0  % Final  . Abs Immature Granulocytes 02/28/2021 0.01  0.00 - 0.07 K/uL Final   Performed at [facility], [address], [place], [place]  [address]  . Sodium 02/28/2021 139  135 - 145 mmol/L Final  . Potassium 02/28/2021 3.4 (A) 3.5 - 5.1 mmol/L Final  . Chloride 02/28/2021 102  98 - 111 mmol/L Final  . CO2 02/28/2021 29  22 - 32 mmol/L Final  . Glucose, Bld 02/28/2021 75  70 - 99 mg/dL Final   Glucose reference range applies only to samples taken after fasting for at least 8 hours.  . BUN 02/28/2021 17  6 - 20 mg/dL Final  . Creatinine, Ser 02/28/2021 0.60  0.44 - 1.00 mg/dL Final  . Calcium 64/33/2951 9.3  8.9 - 10.3 mg/dL Final  . Total Protein 02/28/2021 7.3  6.5 - 8.1 g/dL Final  . Albumin 88/41/6606 3.8  3.5 - 5.0 g/dL Final  . AST 30/16/0109 20  15 - 41 U/L Final  . ALT 02/28/2021 18  0 - 44 U/L Final  . Alkaline Phosphatase 02/28/2021 43  38 - 126 U/L Final  . Total Bilirubin 02/28/2021 0.6  0.3 - 1.2 mg/dL Final  . GFR, Estimated 02/28/2021 >60  >60 mL/min Final   Comment: (NOTE) Calculated using the CKD-EPI Creatinine Equation (2021)   . Anion gap 02/28/2021 8  5 - 15 Final  Performed at [facility], [address] [place], [place] [address]  . Hgb A1c MFr Bld 02/28/2021 5.1  4.8 - 5.6 % Final   Comment: (NOTE) Pre diabetes:          5.7%-6.4%  Diabetes:              >6.4%  Glycemic control for   <7.0% adults with diabetes   . Mean Plasma Glucose 02/28/2021 99.67  mg/dL Final   Performed at [facility], [address], [place], [place] [address]  . TSH 02/28/2021 2.782  0.350 - 4.500 uIU/mL Final   Comment: Performed by a 3rd Generation assay with a functional sensitivity of <=0.01 uIU/mL. Performed at [facility], [address] [place], [place] [address]   . POC Amphetamine UR 02/28/2021 None Detected  NONE DETECTED (Cut Off Level 1000 ng/mL) Final  . POC Secobarbital (BAR) 02/28/2021 None Detected  NONE DETECTED (Cut Off Level 300 ng/mL) Final  . POC Buprenorphine (BUP) 02/28/2021 None Detected  NONE DETECTED (Cut Off Level 10 ng/mL) Final  . POC Oxazepam (BZO) 02/28/2021 None  Detected  NONE DETECTED (Cut Off Level 300 ng/mL) Final  . POC Cocaine UR 02/28/2021 None Detected  NONE DETECTED (Cut Off Level 300 ng/mL) Final  . POC Methamphetamine UR 02/28/2021 None Detected  NONE DETECTED (Cut Off Level 1000 ng/mL) Final  . POC Morphine 02/28/2021 None Detected  NONE DETECTED (Cut Off Level 300 ng/mL) Final  . POC Oxycodone UR 02/28/2021 None Detected  NONE DETECTED (Cut Off Level 100 ng/mL) Final  . POC Methadone UR 02/28/2021 None Detected  NONE DETECTED (Cut Off Level 300 ng/mL) Final  . POC Marijuana UR 02/28/2021 None Detected  NONE DETECTED (Cut Off Level 50 ng/mL) Final  . SARSCOV2ONAVIRUS 2 AG 02/28/2021 NEGATIVE  NEGATIVE Final   Comment: (NOTE) SARS-CoV-2 antigen NOT DETECTED.   Negative results are presumptive.  Negative results do not preclude SARS-CoV-2 infection and should not be used as the sole basis for treatment or other patient management decisions, including infection  control decisions, particularly in the presence of clinical signs and  symptoms consistent with COVID-19, or in those who have been in contact with the virus.  Negative results must be combined with clinical observations, patient history, and epidemiological information. The expected result is Negative.  Fact Sheet for Patients: https://www.jennings-kim.com/  Fact Sheet for Healthcare Providers: https://alexander-rogers.biz/  This test is not yet approved or cleared by the [place] FDA and  has been authorized for detection and/or diagnosis of SARS-CoV-2 by FDA under an Emergency Use Authorization (EUA).  This EUA will remain in effect (meaning this test can be used) for the duration of  the COV                          ID-19 declaration under Section 564(b)(1) of the Act, 21 U.S.C. section 360bbb-3(b)(1), unless the authorization is terminated or revoked sooner.    . Preg Test, Ur 02/28/2021 NEGATIVE  NEGATIVE Final   Comment:        THE  SENSITIVITY OF THIS METHODOLOGY IS >24 mIU/mL   . Cholesterol 02/28/2021 138  0 - 200 mg/dL Final  . Triglycerides 02/28/2021 69  <150 mg/dL Final  . HDL 57/84/6962 50  >40 mg/dL Final  . Total CHOL/HDL Ratio 02/28/2021 2.8  RATIO Final  . VLDL 02/28/2021 14  0 - 40 mg/dL Final  . LDL Cholesterol 02/28/2021  74  0 - 99 mg/dL Final   Comment:        Total Cholesterol/HDL:CHD Risk Coronary Heart Disease Risk Table                     Men   Women  1/2 Average Risk   3.4   3.3  Average Risk       5.0   4.4  2 X Average Risk   9.6   7.1  3 X Average Risk  23.4   11.0        Use the calculated Patient Ratio above and the CHD Risk Table to determine the patient's CHD Risk.        ATP III CLASSIFICATION (LDL):  <100     mg/dL   Optimal  678-938  mg/dL   Near or Above                    Optimal  130-159  mg/dL   Borderline  101-751  mg/dL   High  >025     mg/dL   Very High Performed at [facility], [address], [place], [place] [address]     Allergies: Patient has no known allergies.  PTA Medications: (Not in a hospital admission)   Medical Decision Making  Patient is disorganized, she is seen slapping herself in the face. She is responding to internal stimuli. She is having auditory hallucinations that tell her to kill herself. She is unable to contract for safety.     Recommendations  Based on my evaluation the patient does not appear to have an emergency medical condition.   Patient will be restarted on home medications. She will be admitted for overnight observation and will be reassessed in the am by psychiatry to determine disposition.   EKG ordered Labs ordered: CBC with differential, CMP, A1c, lipid panel, TSH, urine drug screen, ethanol, urinalysis. Home medications reordered  Ardis Hughs, NP 06/04/21  10:19 AM

## 2021-06-04 NOTE — ED Notes (Signed)
Pt responding to internal stimuli while lying in her bed. When staff ask if pt is hearing or seeing anything, pt close eyes to return to a resting position. Able to be redirected. Safety maintained.
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
## 2021-06-04 NOTE — ED Notes (Signed)
Pt resting with eyes closed. Intermittent awakening and observed responding to internal stimuli. Denies concerns. Will continue to monitor for safety.

## 2021-06-04 NOTE — ED Notes (Signed)
Patient arrived on unit. Patient resting and comfortable. Patient redirectable and cooperative.

## 2021-06-04 NOTE — ED Notes (Signed)
Pt in bed resting with eyes closed. Breathing even and non-labored.  No complaint of pain or discomfort at this time.  Will continue to monitor for safety.

## 2021-06-04 NOTE — ED Notes (Signed)
Pt admitted to continuous assessment due to actively endorsing AH. Pt muffle in tone, easily agitated but able to be redirected during assessment. Pt oriented to unit, belongings secured in locker #25. Pt resting on unit in no acute distress. Informed pt to notify staff with any needs or concerns. Will monitor for safety.

## 2021-06-04 NOTE — Progress Notes (Signed)
Pt is resting quietly. No signs of acute distress noted. Staff will monitor for pt's safety. 

## 2021-06-05 DIAGNOSIS — F200 Paranoid schizophrenia: Secondary | ICD-10-CM | POA: Diagnosis not present

## 2021-06-05 DIAGNOSIS — F17210 Nicotine dependence, cigarettes, uncomplicated: Secondary | ICD-10-CM | POA: Diagnosis not present

## 2021-06-05 DIAGNOSIS — Z79899 Other long term (current) drug therapy: Secondary | ICD-10-CM | POA: Diagnosis not present

## 2021-06-05 DIAGNOSIS — R45851 Suicidal ideations: Secondary | ICD-10-CM | POA: Diagnosis not present

## 2021-06-05 DIAGNOSIS — Z9114 Patient's other noncompliance with medication regimen: Secondary | ICD-10-CM | POA: Diagnosis not present

## 2021-06-05 DIAGNOSIS — Z20822 Contact with and (suspected) exposure to covid-19: Secondary | ICD-10-CM | POA: Diagnosis not present

## 2021-06-05 MED ORDER — VITAMIN D3 25 MCG PO TABS: 1000.0000 [IU] | ORAL_TABLET | Freq: Every day | ORAL | 0 refills | Status: AC

## 2021-06-05 MED ORDER — BENZTROPINE MESYLATE 1 MG PO TABS: 1.0000 mg | ORAL_TABLET | Freq: Two times a day (BID) | ORAL | 0 refills | Status: DC | PRN

## 2021-06-05 MED ORDER — RISPERIDONE 3 MG PO TBDP: 6.0000 mg | ORAL_TABLET | Freq: Every day | ORAL | 0 refills | Status: DC

## 2021-06-05 MED ORDER — HALOPERIDOL 5 MG PO TABS: 15.0000 mg | ORAL_TABLET | Freq: Every day | ORAL | 0 refills | Status: DC

## 2021-06-05 MED ORDER — HALOPERIDOL 10 MG PO TABS: 10.0000 mg | ORAL_TABLET | Freq: Every day | ORAL | 0 refills | Status: DC

## 2021-06-05 MED ORDER — HALOPERIDOL 5 MG PO TABS
10.0000 mg | ORAL_TABLET | Freq: Every day | ORAL | Status: DC
  Filled 2021-06-05: qty 35

## 2021-06-05 NOTE — Progress Notes (Signed)
CSW reached out to the [facility] to verify the patient is receiving ACTT services at the provider's request to inquire about current services via sure chat. It was reported that the patient is noted receiving enhanced services through [facility]/located: [address] [place] [address]/Phone#: [phone]. The patient is documented receiving Administrator, arts (CST) services with [facility] at this time. CSW notified provider via secure chat of information provided.   Crissie Reese, MSW, LCSW-A, LCAS-A Phone: [phone] Disposition/TOC \

## 2021-06-05 NOTE — ED Notes (Signed)
Patient appears to be sleeping.  RR even and unlabored.  Continue to monitor for safety. 

## 2021-06-05 NOTE — ED Provider Notes (Signed)
FBC/OBS ASAP Discharge Summary  Date and Time: 06/05/2021 11:58 AM  Name: [name] [name]  MRN:  [id number]   Discharge Diagnoses:  Final diagnoses:  Schizophrenia, paranoid type [facility])    Subjective:   [name] [name], 45 y.o., female patient presented to to [facility] as a walk in voluntarily accompanied by [employer] on 06/04/2021 with complaints of "he is telling me to kill myself".  Patient was admitted for overnight continuous assessment.  Patient seen face to face by this provider, Dr. Lucianne Muss; and chart reviewed on 06/05/21.    During evaluation [name] [name] is laying in bed.  She is alert/oriented x 4; and cooperative.  She is withdrawn and makes good eye contact.  Patient denies depression and states that she is ready to be discharged.Marland Kitchen  Her speech is clear and coherent with moderate Volume. States, "I slept a lot here". Patients thought process was not disorganized during this evaluation. She was logical and was able to state that she follows up with [facility]. Her therapist is Clydie Braun and her provider is Dr. Luretha Rued. Sates she was going to start a new program but had not been able to follow up. There is no indication that she is currently responding to internal/external stimuli. Patient denies suicidal/self-harm/homicidal ideation. Patient contracted for safety and states she has no access to firearms/weapons. Patient denies auditory and visual hallucinations on this assessment. States she has not heard "him" talking to her today.  Patient denies paranoia and delusional thought contact. States "he has not bothered me today" when she discusses the voice she was hearing on admission. States as long has "he" leaves her alone she doesnt feel paranoid.  Multiple call attempt was made to [facility] [phone] and Dr. Luretha Rued [phone]- attempts unsuccessful. Unable to leave a message for a return call.  Social work referral was made to contact [facility].  Stay Summary:   [name] [name] was  admitted to [facility] Continuous Assessment unit for paranoid schizophrenia and crisis management.  Her home medications were restarted and were tolerated with no adverse reactions.   [name] [name] was discharged with current medications and was instructed on how to take medications as prescribed; (details listed below under Medication List). SAMPLES GIVEN :  Patient provided with 7 day sample of Haldol 15 mg PO QHS, Haldol 10 mg PO QD, Risperdal 6 mg PO QHS  and Cogentin 1 mg BID PRN. Patient verbalized understanding on medication regimen. Reported no questions for this Clinical research associate. Sates she has access to cell phone to call [facility] and confirm her appointment time and if no current appointment, then make one.   [name] [name]'s improvement was monitored by continuous assessment/observation and her report of symptom reduction. Her emotional and mental status was also monitored by staff. Patient will follow up with the services as listed below under Follow up Information.     Upon completion of this admission the [name] [name] was both mentally and medically stable for discharge denying suicidal/homicidal ideation, auditory/visual/tactile hallucinations, delusional thoughts and paranoia.     Total Time spent with patient: 30 minutes  Past Psychiatric History: paranoid schizophrenia, schizoaffective depression Past Medical History:  Past Medical History:  Diagnosis Date   Arthritis    Depression    Hallucinations    Paranoid schizophrenia (HCC)    Schizoaffective disorder (HCC)    No past surgical history on file. Family History: No family history on file. Family Psychiatric History: unknown Social History:  Social History   Substance and Sexual Activity  Alcohol Use Not Currently   Comment: She denies      Social History   Substance and Sexual Activity  Drug Use Not Currently   Comment: She denies     Social History   Socioeconomic History   Marital status: Single    Spouse name:  Not on file   Number of children: Not on file   Years of education: Not on file   Highest education level: Not on file  Occupational History   Occupation: Disability  Tobacco Use   Smoking status: Every Day    Packs/day: 1.00    Types: Cigarettes   Smokeless tobacco: Never  Vaping Use   Vaping Use: Never used  Substance and Sexual Activity   Alcohol use: Not Currently    Comment: She denies    Drug use: Not Currently    Comment: She denies    Sexual activity: Not Currently  Other Topics Concern   Not on file  Social History Narrative   Pt stated that she lives in [place], and that she lives alone.  Pt receives outpatient psychiatric resources through [facility].   Social Determinants of Health   Financial Resource Strain: Not on file  Food Insecurity: Not on file  Transportation Needs: Not on file  Physical Activity: Not on file  Stress: Not on file  Social Connections: Not on file   SDOH:  SDOH Screenings   Alcohol Screen: Low Risk    Last Alcohol Screening Score (AUDIT): 0  Depression (PHQ2-9): Low Risk    PHQ-2 Score: 0  Financial Resource Strain: Not on file  Food Insecurity: Not on file  Housing: Not on file  Physical Activity: Not on file  Social Connections: Not on file  Stress: Not on file  Tobacco Use: High Risk   Smoking Tobacco Use: Every Day   Smokeless Tobacco Use: Never  Transportation Needs: Not on file    Tobacco Cessation:  N/A, patient does not currently use tobacco products  Current Medications:  Current Facility-Administered Medications  Medication Dose Route Frequency Provider Last Rate Last Admin   acetaminophen (TYLENOL) tablet 650 mg  650 mg Oral Q6H PRN Ardis Hughs, NP       alum & mag hydroxide-simeth (MAALOX/MYLANTA) 200-200-20 MG/5ML suspension 30 mL  30 mL Oral Q4H PRN Ardis Hughs, NP       ascorbic acid (VITAMIN C) tablet 500 mg  500 mg Oral Daily Vernard Gambles H, NP   500 mg at 06/05/21 0935    benztropine (COGENTIN) tablet 1 mg  1 mg Oral BID PRN Ardis Hughs, NP       cholecalciferol (VITAMIN D3) tablet 1,000 Units  1,000 Units Oral Daily Ardis Hughs, NP   1,000 Units at 06/05/21 0935   famotidine (PEPCID) tablet 20 mg  20 mg Oral BID Ardis Hughs, NP   20 mg at 06/05/21 0934   haloperidol (HALDOL) tablet 10 mg  10 mg Oral BID Ardis Hughs, NP   10 mg at 06/05/21 6270   haloperidol (HALDOL) tablet 15 mg  15 mg Oral QHS Ardis Hughs, NP   15 mg at 06/04/21 2109   hydrOXYzine (ATARAX/VISTARIL) tablet 25 mg  25 mg Oral TID PRN Ardis Hughs, NP       magnesium hydroxide (MILK OF MAGNESIA) suspension 30 mL  30 mL Oral Daily PRN Ardis Hughs, NP       risperiDONE (RISPERDAL M-TABS) disintegrating tablet 2 mg  2 mg Oral Daily Vernard Gambles H, NP   2 mg at 06/05/21 1324   risperiDONE (RISPERDAL M-TABS) disintegrating tablet 6 mg  6 mg Oral QHS Ardis Hughs, NP   6 mg at 06/04/21 2109   traZODone (DESYREL) tablet 150 mg  150 mg Oral QHS Ardis Hughs, NP   150 mg at 06/04/21 2110   Current Outpatient Medications  Medication Sig Dispense Refill   haloperidol decanoate (HALDOL DECANOATE) 100 MG/ML injection Inject 200 mg into the muscle every 28 (twenty-eight) days.     benztropine (COGENTIN) 2 MG tablet Take 2 mg by mouth 2 (two) times daily. (Patient not taking: Reported on 06/04/2021)     famotidine (PEPCID) 20 MG tablet Take 1 tablet (20 mg total) by mouth 2 (two) times daily. (Patient not taking: Reported on 06/04/2021) 60 tablet 0   GOODSENSE SENNA LAXATIVE 8.6 MG tablet Take 2 tablets by mouth at bedtime as needed for constipation. (Patient not taking: Reported on 06/04/2021)     mirtazapine (REMERON) 15 MG tablet Take 15 mg by mouth at bedtime. (Patient not taking: Reported on 06/04/2021)     Multiple Vitamins-Minerals (ONE-DAILY MULTI-VIT/MINERAL) TABS Take 1 tablet by mouth daily. (Patient not taking: Reported on 06/04/2021)      vitamin C (ASCORBIC ACID) 500 MG tablet Take 500 mg by mouth daily. (Patient not taking: Reported on 06/04/2021)     Vitamin D, Cholecalciferol, 25 MCG (1000 UT) TABS Take 1 tablet by mouth daily. (Patient not taking: Reported on 06/04/2021)      PTA Medications: (Not in a hospital admission)   Musculoskeletal  Strength & Muscle Tone: within normal limits Gait & Station: normal Patient leans: N/A  Psychiatric Specialty Exam  Presentation  General Appearance: Disheveled  Eye Contact:Fleeting  Speech:Clear and Coherent; Normal Rate  Speech Volume:Normal  Handedness:Right   Mood and Affect  Mood:Anxious  Affect:Congruent   Thought Process  Thought Processes:Coherent  Descriptions of Associations:Intact  Orientation:Full (Time, Place and Person)  Thought Content:Logical; Paranoid Ideation  Diagnosis of Schizophrenia or Schizoaffective disorder in past: Yes  Duration of Psychotic Symptoms: Greater than six months   Hallucinations:Hallucinations: None Description of Auditory Hallucinations: "He tells me to kill my self"  Ideas of Reference:None  Suicidal Thoughts:Suicidal Thoughts: No SI Passive Intent and/or Plan: Without Intent; Without Plan; Without Means to Carry Out  Homicidal Thoughts:Homicidal Thoughts: No   Sensorium  Memory:Immediate Fair; Recent Fair; Remote Fair  Judgment:Fair  Insight:Fair   Executive Functions  Concentration:Fair  Attention Span:Fair  Recall:Fair  Fund of Knowledge:Fair  Language:Fair   Psychomotor Activity  Psychomotor Activity:Psychomotor Activity: Normal   Assets  Assets:Desire for Improvement; Financial Resources/Insurance; Housing; Physical Health   Sleep  Sleep:Sleep: Good Number of Hours of Sleep: 8   No data recorded  Physical Exam  Physical Exam Vitals and nursing note reviewed.  Constitutional:      General: She is not in acute distress.    Appearance: Normal appearance. She is not  ill-appearing.  HENT:     Head: Normocephalic.  Eyes:     Conjunctiva/sclera: Conjunctivae normal.  Cardiovascular:     Rate and Rhythm: Normal rate.  Pulmonary:     Effort: Pulmonary effort is normal.  Musculoskeletal:        General: Normal range of motion.     Cervical back: Normal range of motion.  Skin:    General: Skin is warm and dry.  Neurological:     Mental Status: She is alert and  oriented to person, place, and time.  Psychiatric:        Attention and Perception: Attention and perception normal.        Mood and Affect: Mood is anxious and depressed.        Speech: Speech normal.        Behavior: Behavior normal. Behavior is cooperative.        Thought Content: Thought content is paranoid.        Cognition and Memory: Cognition normal.        Judgment: Judgment is impulsive.   Review of Systems  Constitutional: Negative.  Negative for chills and fever.  HENT: Negative.  Negative for hearing loss.   Eyes: Negative.   Respiratory:  Negative for cough.   Cardiovascular: Negative.  Negative for chest pain.  Musculoskeletal: Negative.   Skin: Negative.   Neurological: Negative.   Psychiatric/Behavioral:  The patient is nervous/anxious.   Blood pressure 104/75, pulse 95, temperature 98.6 F (37 C), temperature source Oral, resp. rate 16, SpO2 100 %. There is no height or weight on file to calculate BMI.  Demographic Factors:  Low socioeconomic status and Living alone  Loss Factors: Financial problems/change in socioeconomic status  Historical Factors: Impulsivity  Risk Reduction Factors:   Positive social support and Positive therapeutic relationship  Continued Clinical Symptoms:  Depression:   Impulsivity Schizophrenia:   Command hallucinatons Paranoid or undifferentiated type  Cognitive Features That Contribute To Risk:  None    Suicide Risk:  Minimal: No identifiable suicidal ideation.  Patients presenting with no risk factors but with morbid  ruminations; may be classified as minimal risk based on the severity of the depressive symptoms  Plan Of Care/Follow-up recommendations:  Activity:  as tolerated  Diet:  regular  Disposition:   Discharge patient  Patient provided with 7 day sample of Haldol 15 mg PO QHS, Haldol 10 mg PO QD, Risperdal 6 mg PO QHS  and Cogentin 1 mg BID PRN  Follow up [facility] [phone] and Dr. Luretha RuedFaison [phone]  No evidence of imminent risk to self or others at present.    Patient does not meet criteria for psychiatric inpatient admission. Discussed crisis plan, support from social network, calling 911, coming to the Emergency Department, and calling Suicide Hotline.   Ardis Hughsarolyn H Launi Asencio, NP 06/05/2021, 11:58 AM  .

## 2021-06-05 NOTE — Progress Notes (Signed)
Pt is awake, alert and oriented. Pt did not voice any complaints of pain or discomfort. Pt's safety is maintained.

## 2021-06-05 NOTE — ED Notes (Signed)
Pt sleeping@this time. Breathing even and unlabored. Will continue to monitor for safety 

## 2021-06-05 NOTE — Progress Notes (Signed)
CSW attempted to contact [facility] [employer]/Phone#:[phone] in reference to facilitate transport back to her residence upon discharge. CSW has been unsuccessful in attempting to reach anyone after multiple attempts.   Crissie Reese, MSW, LCSW-A, LCAS-A Phone: [phone] Disposition/TOC

## 2021-06-05 NOTE — Discharge Instructions (Addendum)

## 2021-06-05 NOTE — Discharge Summary (Signed)
[name] to be D/C'd Home per NP order. Discussed with the patient and all questions fully answered. An After Visit Summary was printed and given to the patient. Medication samples were given to patient. Patient escorted out and D/C home via taxi cab. Dickie La  06/05/2021 2:23 PM

## 2021-06-05 NOTE — Progress Notes (Signed)
CSW facilitated the patient be transported back to her residence upon discharge via taxi voucher, which was approved by weekend [facility]. It should be noted for documentation purposes all options has been explored before for the patient to be transported back to residence. Moreover, [employer] were attempted to be contacted several times and a bus pass was not appropriate due to the patients documented history.   Crissie Reese, MSW, LCSW-A, LCAS-A Phone: [phone] Disposition/TOC

## 2021-06-09 ENCOUNTER — Ambulatory Visit (HOSPITAL_COMMUNITY)
Admission: EM | Admit: 2021-06-09 | Discharge: 2021-06-09 | Disposition: A | Discharge status: Home / Self Care | Payer: Medicare Other | Attending: Psychiatry | Admitting: Psychiatry

## 2021-06-09 ENCOUNTER — Other Ambulatory Visit: Payer: Self-pay

## 2021-06-09 DIAGNOSIS — F200 Paranoid schizophrenia: Secondary | ICD-10-CM | POA: Insufficient documentation

## 2021-06-09 DIAGNOSIS — Z5181 Encounter for therapeutic drug level monitoring: Secondary | ICD-10-CM | POA: Diagnosis not present

## 2021-06-09 DIAGNOSIS — Z79899 Other long term (current) drug therapy: Secondary | ICD-10-CM

## 2021-06-09 DIAGNOSIS — Z91128 Patient's intentional underdosing of medication regimen for other reason: Secondary | ICD-10-CM | POA: Diagnosis not present

## 2021-06-09 DIAGNOSIS — T43596A Underdosing of other antipsychotics and neuroleptics, initial encounter: Secondary | ICD-10-CM | POA: Insufficient documentation

## 2021-06-09 DIAGNOSIS — T434X6A Underdosing of butyrophenone and thiothixene neuroleptics, initial encounter: Secondary | ICD-10-CM | POA: Diagnosis not present

## 2021-06-09 NOTE — Discharge Instructions (Addendum)
Please take medications as prescribed.  Please come to [facility] (this facility) during walk in hours for appointment with psychiatrist for further medication management and for therapists for therapy.    Walk in hours are 8-11 AM Monday through Thursday for medication management.Child and adolescent psychiatrists are only available on Wednesdays and Thursdays during walk in hours.  Therapy walk in hours are Monday-Wednesday 8 AM-1PM.   It is first come, first -serve; it is best to arrive by 7:00 AM.   On Friday from 1 pm to 4 pm for therapy intake only. Please arrive by 12:00 pm as it is  first come, first -serve.    When you arrive please go upstairs for your appointment. If you are unsure of where to go, inform the front desk that you are here for a walk in appointment and they will assist you with directions upstairs.  Address:  [address], in [place], [address] Ph: [phone]
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
## 2021-06-09 NOTE — ED Provider Notes (Signed)
Behavioral Health Urgent Care Medical Screening Exam  Patient Name: [name] [name] MRN: [id number] Date of Evaluation: 06/09/21 Chief Complaint:  Medication management Diagnosis:  Final diagnoses:  Medication management    History of Present illness: [name] [name] is a 45 y.o. female w/ hx of schizophrenia and repeated ED visit presents to [facility] because patient is dissatisfied with diagnosis of schizophrenia and does not believe she has schizophrenia. Pt states she has "depression" and requests that the diagnosis be changed. Pt insists she does not have schizophrenia and that she has not taken the recommended risperdal and haloperidol because of this but also because she was unable to remove the cap off the medication bottle.   Pt states she only has depression as she endorses depressed mood, anhedonia, poor appetite, and poor sleep. Pt has extensive history of auditory hallucination in which God will say derogatory remarks to her and will make pt hit herself in the face. This has been extensively documented per chart review. Pt denies this as an auditory hallucination. Pt states she would like to re-establish care with a psychiatric provider. Pt agreeable to re-establish outpatient psych at [facility]. Pt provided with walk-in clinic information.  Pt denies present SI/HI/AVH.  Psychiatric Specialty Exam  Presentation  General Appearance:Disheveled  Eye Contact:Poor  Speech:Normal Rate  Speech Volume:Decreased  Handedness:Right   Mood and Affect  Mood:Depressed; Anxious  Affect:Flat   Thought Process  Thought Processes:Coherent  Descriptions of Associations:Intact  Orientation:Full (Time, Place and Person)  Thought Content:Paranoid Ideation  Diagnosis of Schizophrenia or Schizoaffective disorder in past: Yes  Duration of Psychotic Symptoms: Greater than six months  Hallucinations:Auditory God tells pt to hit herself  Ideas of Reference:None  Suicidal Thoughts:No Without  Intent; Without Plan; Without Means to Carry Out  Homicidal Thoughts:No   Sensorium  Memory:Immediate Fair; Recent Fair; Remote Fair  Judgment:Fair  Insight:Fair   Executive Functions  Concentration:Fair  Attention Span:Fair  Recall:Fair  Fund of Knowledge:Fair  Language:Fair   Psychomotor Activity  Psychomotor Activity:Normal   Assets  Assets:Desire for Improvement; Financial Resources/Insurance; Physical Health   Sleep  Sleep:Good  Number of hours: 8   No data recorded  Physical Exam: Physical Exam Vitals and nursing note reviewed.  Constitutional:      General: She is not in acute distress.    Appearance: She is well-developed.  HENT:     Head: Normocephalic and atraumatic.  Eyes:     Conjunctiva/sclera: Conjunctivae normal.  Cardiovascular:     Rate and Rhythm: Normal rate and regular rhythm.     Heart sounds: No murmur heard. Pulmonary:     Effort: Pulmonary effort is normal. No respiratory distress.     Breath sounds: Normal breath sounds.  Abdominal:     Palpations: Abdomen is soft.     Tenderness: There is no abdominal tenderness.  Musculoskeletal:     Cervical back: Neck supple.  Skin:    General: Skin is warm and dry.  Neurological:     Mental Status: She is alert.   Review of Systems  Constitutional:  Negative for chills and fever.  Respiratory:  Negative for cough, shortness of breath and wheezing.   Cardiovascular:  Negative for chest pain and palpitations.  Gastrointestinal:  Negative for abdominal pain, nausea and vomiting.  Skin:  Negative for itching and rash.  Neurological:  Negative for dizziness and headaches.  Blood pressure 100/65, pulse 80, temperature 98.4 F (36.9 C), temperature source Oral, resp. rate 18, SpO2 99 %. There is no height  or weight on file to calculate BMI.  Musculoskeletal: Strength & Muscle Tone: within normal limits Gait & Station: normal Patient leans: Front   [facility] MSE Discharge Disposition  for Follow up and Recommendations: Based on my evaluation the patient does not appear to have an emergency medical condition and can be discharged with resources and follow up care in outpatient services for Medication Management and Individual Therapy  Pt safe to discharge home. Provided pt information for outpatient clinic at [facility].    Park Pope, MD 06/09/2021, 2:32 PM

## 2021-06-09 NOTE — ED Notes (Signed)
Calling safe transport per Pt's request

## 2021-06-09 NOTE — ED Notes (Signed)
Discharge instructions provided and Pt stated understanding. Personal belongings returned from locker. Pt escorted to the front lobby by staff. Pt alert, orient and ambulatory. Safety maintained.

## 2021-06-09 NOTE — ED Notes (Signed)
[facility] called for Pt to get home per Safe transport services

## 2021-06-12 ENCOUNTER — Other Ambulatory Visit: Payer: Self-pay

## 2021-06-12 ENCOUNTER — Ambulatory Visit (HOSPITAL_COMMUNITY)
Admission: EM | Admit: 2021-06-12 | Discharge: 2021-06-12 | Disposition: A | Discharge status: Other Acute Inpatient Hospital | Payer: Medicare Other | Attending: Family | Admitting: Family

## 2021-06-12 DIAGNOSIS — Z20822 Contact with and (suspected) exposure to covid-19: Secondary | ICD-10-CM | POA: Insufficient documentation

## 2021-06-12 DIAGNOSIS — F200 Paranoid schizophrenia: Secondary | ICD-10-CM | POA: Insufficient documentation

## 2021-06-12 LAB — POC SARS CORONAVIRUS 2 AG: SARSCOV2ONAVIRUS 2 AG: NEGATIVE

## 2021-06-12 MED ORDER — LORAZEPAM 2 MG/ML IJ SOLN
INTRAMUSCULAR | Status: AC
  Administered 2021-06-12: 2 mg
  Filled 2021-06-12: qty 1

## 2021-06-12 MED ORDER — HALOPERIDOL 5 MG PO TABS
10.0000 mg | ORAL_TABLET | Freq: Once | ORAL | Status: AC
  Administered 2021-06-12: 10 mg via ORAL
  Filled 2021-06-12: qty 2

## 2021-06-12 NOTE — Discharge Instructions (Addendum)
Take all medications as prescribed. Keep all follow-up appointments as scheduled.  Do not consume alcohol or use illegal drugs while on prescription medications. Report any adverse effects from your medications to your primary care provider promptly.  In the event of recurrent symptoms or worsening symptoms, call 911, a crisis hotline, or go to the nearest emergency department for evaluation.   

## 2021-06-12 NOTE — ED Provider Notes (Addendum)
Behavioral Health Urgent Care Medical Screening Exam  Patient Name: [name] [name] MRN: [id number] Date of Evaluation: 06/12/21 Chief Complaint:   Diagnosis:  Final diagnoses:  Schizophrenia, paranoid (HCC)    History of Present illness: [name] [name] is a 45 y.o. female presented to [facility] urgent care accompanied by [employer].  Per [employer] patient requested to be evaluated for her mental health.  They reported they observed patient responding to internal stimuli.   Upon this assessment patient is denying suicidal or homicidal ideations.  Denies auditory or visual hallucinations states" I just need to stay here for a day because I am having problems at home." Patient did not elaborate on which problem she is referring to.  States she resides by her self.  Denies that she is currently followed by act services.  Reports she had taken medications on yesterday.  Psychiatric Specialty Exam  Presentation  General Appearance:Bizarre  Eye Contact:Minimal  Speech:Clear and Coherent  Speech Volume:Normal  Handedness:Right   Mood and Affect  Mood:Anxious  Affect:Congruent   Thought Process  Thought Processes:Coherent  Descriptions of Associations:Intact  Orientation:Full (Time, Place and Person)  Thought Content:Delusions; Rumination  Diagnosis of Schizophrenia or Schizoaffective disorder in past: Yes  Duration of Psychotic Symptoms: Greater than six months  Hallucinations:Auditory God tells pt to hit herself  Ideas of Reference:Paranoia; Delusions  Suicidal Thoughts:No Without Intent; Without Plan; Without Means to Carry Out  Homicidal Thoughts:No   Sensorium  Memory:Recent Fair; Immediate Fair; Remote Fair  Judgment:Fair  Insight:Fair   Executive Functions  Concentration:Fair  Attention Span:Fair  Recall:Fair  Fund of Knowledge:Good  Language:Fair   Psychomotor Activity  Psychomotor Activity:Normal   Assets  Assets:Social  Support; Desire for Improvement   Sleep  Sleep:Fair  Number of hours: 8   Nutritional Assessment (For OBS and FBC admissions only) Has the patient had a weight loss or gain of 10 pounds or more in the last 3 months?: No Has the patient had a decrease in food intake/or appetite?: No Does the patient have dental problems?: No Does the patient have eating habits or behaviors that may be indicators of an eating disorder including binging or inducing vomiting?: No Has the patient recently lost weight without trying?: No Has the patient been eating poorly because of a decreased appetite?: No Malnutrition Screening Tool Score: 0   Physical Exam: Physical Exam Vitals and nursing note reviewed.  Cardiovascular:     Rate and Rhythm: Normal rate.     Pulses: Normal pulses.  Neurological:     Mental Status: She is oriented to person, place, and time.  Psychiatric:        Attention and Perception: Attention normal. She perceives auditory hallucinations.        Mood and Affect: Mood is depressed.        Speech: Speech normal.        Behavior: Behavior normal.        Thought Content: Thought content is paranoid and delusional.        Cognition and Memory: Cognition normal.        Judgment: Judgment normal.   Review of Systems  Eyes: Negative.   Cardiovascular: Negative.   Musculoskeletal: Negative.   Skin: Negative.   Neurological: Negative.   Endo/Heme/Allergies: Negative.   Psychiatric/Behavioral:  Positive for depression. The patient is nervous/anxious.   All other systems reviewed and are negative. Blood pressure 110/79, pulse 90, temperature 97.9 F (36.6 C), resp. rate 18, SpO2 100 %. There is no  height or weight on file to calculate BMI.  Musculoskeletal: Strength & Muscle Tone: within normal limits Gait & Station: normal Patient leans: N/A   [facility] MSE Discharge Disposition for Follow up and Recommendations: Based on my evaluation the patient does not appear to have an  emergency medical condition and can be discharged with resources and follow up care in outpatient services for Individual Therapy and Group Therapy  - patient to be observed  for 4 hours and transported back home.    Oneta Rack, NP 06/12/2021, 3:29 PM
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
## 2021-06-12 NOTE — ED Notes (Addendum)
Pt started screaming about "people trying to kill me," and hitting herself in head. Redirected multiple times. Pt won't calm herself down. Goes from being tearful to agitated and cussing staff. VO recieced for ativan 2mg  IM NOW from P. White NP
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
## 2021-06-12 NOTE — ED Notes (Signed)
Ambulated per self onto unit. Endorsing SI and AH. No HI or VH. No s/s pain, discomfort, or acute distress noted. A&O x4. Oriented to staff and unit. Will continue to monitor for safety

## 2021-06-12 NOTE — Progress Notes (Addendum)
Patient heard screaming from the waiting area. On approach, this provider observed the patient screaming and talking to herself. Verbal support was provided to the patient  but the patient continued to escalate. Per nursing, the patient received Haldol 10 mg p.o. by mouth for psychosis around 1115.  Patient began smacking herself in the face repeatedly. Staff attempted to verbally de-escalate the patient, however she continued slapping herself in the face and talking to herself. She was given Ativan 2 mg IM for agitation. Report called to Dr. Karene Fry at [facility]. Patient will be transported to [facility] ED for observation and safety.

## 2021-06-12 NOTE — Progress Notes (Signed)
RN called charge phone [phone] twice in attempt to inform patient being sent by psych NP for stability and needing a higher level of care. According to NP, Dr. Karene Fry is the accepting provider.

## 2021-06-21 ENCOUNTER — Other Ambulatory Visit: Payer: Self-pay

## 2021-06-21 ENCOUNTER — Ambulatory Visit (HOSPITAL_COMMUNITY)
Admission: EM | Admit: 2021-06-21 | Discharge: 2021-06-21 | Disposition: A | Discharge status: Home / Self Care | Payer: Medicare Other | Attending: Student in an Organized Health Care Education/Training Program | Admitting: Student in an Organized Health Care Education/Training Program

## 2021-06-21 DIAGNOSIS — F200 Paranoid schizophrenia: Secondary | ICD-10-CM | POA: Insufficient documentation

## 2021-06-21 DIAGNOSIS — Z79899 Other long term (current) drug therapy: Secondary | ICD-10-CM | POA: Diagnosis not present

## 2021-06-21 DIAGNOSIS — F209 Schizophrenia, unspecified: Secondary | ICD-10-CM

## 2021-06-21 MED ORDER — RISPERIDONE 2 MG PO TBDP
6.0000 mg | ORAL_TABLET | Freq: Every day | ORAL | Status: DC
  Filled 2021-06-21: qty 21

## 2021-06-21 MED ORDER — HALOPERIDOL 5 MG PO TABS
10.0000 mg | ORAL_TABLET | Freq: Every day | ORAL | Status: DC
  Filled 2021-06-21: qty 35

## 2021-06-21 MED ORDER — HALOPERIDOL 5 MG PO TABS: 10.0000 mg | ORAL_TABLET | Freq: Every day | ORAL | Status: DC

## 2021-06-21 MED ORDER — HALOPERIDOL 5 MG PO TABS
15.0000 mg | ORAL_TABLET | Freq: Every day | ORAL | 0 refills | Status: DC
  Filled 2021-06-21: qty 90, 30d supply, fill #0

## 2021-06-21 MED ORDER — HALOPERIDOL 5 MG PO TABS: 15.0000 mg | ORAL_TABLET | Freq: Every day | ORAL | Status: DC

## 2021-06-21 MED ORDER — BENZTROPINE MESYLATE 1 MG PO TABS
1.0000 mg | ORAL_TABLET | Freq: Two times a day (BID) | ORAL | 0 refills | Status: DC
  Filled 2021-06-21: qty 60, 30d supply, fill #0

## 2021-06-21 MED ORDER — FAMOTIDINE 20 MG PO TABS
20.0000 mg | ORAL_TABLET | Freq: Two times a day (BID) | ORAL | Status: DC
  Filled 2021-06-21: qty 14

## 2021-06-21 MED ORDER — HALOPERIDOL 10 MG PO TABS
10.0000 mg | ORAL_TABLET | Freq: Every day | ORAL | 0 refills | Status: DC
  Filled 2021-06-21: qty 30, 30d supply, fill #0

## 2021-06-21 MED ORDER — BENZTROPINE MESYLATE 1 MG PO TABS: 1.0000 mg | ORAL_TABLET | Freq: Two times a day (BID) | ORAL | Status: DC

## 2021-06-21 MED ORDER — BENZTROPINE MESYLATE 1 MG PO TABS
1.0000 mg | ORAL_TABLET | Freq: Two times a day (BID) | ORAL | Status: DC
  Filled 2021-06-21: qty 14
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
## 2021-06-21 NOTE — Progress Notes (Signed)
CSW contacted [facility] [phone] to request Care Coordinator to assist with ACTT services due to multiple visits to [facility], Emergency Medicine and [facility] (inpatient stay).  [facility] reported that pt qualifies for care coordination and a coordinator will be assigned to assist.

## 2021-06-21 NOTE — ED Notes (Signed)
Safe transport called for pt.  Pt confirmed address for transportation home.  Pt discharged with belongings.  Reviewed AVS paperwork prior to discharge.  Sample medications given with AVS paperwork.

## 2021-06-21 NOTE — Discharge Instructions (Signed)
° °  Please come to [facility] (this facility) during walk in hours for appointment with psychiatrist for further medication management and for therapists for therapy.  ° ° Walk in hours are 8-11 AM Monday through Thursday for medication management.Child and adolescent psychiatrists are only available on Wednesdays and Thursdays during walk in hours.  °Therapy walk in hours are Monday-Wednesday 8 AM-1PM.   It is first come, first -serve; it is best to arrive by 7:00 AM.  ° °On Friday from 1 pm to 4 pm for therapy intake only. Please arrive by 12:00 pm as it is  first come, first -serve.   ° °When you arrive please go upstairs for your appointment. If you are unsure of where to go, inform the front desk that you are here for a walk in appointment and they will assist you with directions upstairs. ° °Address:  °[address], in [place], [address] °Ph: [phone]  ° °
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
## 2021-06-28 ENCOUNTER — Encounter (HOSPITAL_COMMUNITY): Payer: Self-pay

## 2021-06-28 ENCOUNTER — Other Ambulatory Visit: Payer: Self-pay

## 2021-06-28 ENCOUNTER — Emergency Department (HOSPITAL_COMMUNITY)
Admission: EM | Admit: 2021-06-28 | Discharge: 2021-06-29 | Disposition: A | Discharge status: Home / Self Care | Payer: Medicare Other | Attending: Emergency Medicine | Admitting: Emergency Medicine

## 2021-06-28 DIAGNOSIS — F22 Delusional disorders: Secondary | ICD-10-CM

## 2021-06-28 DIAGNOSIS — Y900 Blood alcohol level of less than 20 mg/100 ml: Secondary | ICD-10-CM | POA: Insufficient documentation

## 2021-06-28 DIAGNOSIS — Z20822 Contact with and (suspected) exposure to covid-19: Secondary | ICD-10-CM | POA: Insufficient documentation

## 2021-06-28 DIAGNOSIS — F200 Paranoid schizophrenia: Secondary | ICD-10-CM | POA: Insufficient documentation

## 2021-06-28 DIAGNOSIS — N9489 Other specified conditions associated with female genital organs and menstrual cycle: Secondary | ICD-10-CM | POA: Insufficient documentation

## 2021-06-28 DIAGNOSIS — F17210 Nicotine dependence, cigarettes, uncomplicated: Secondary | ICD-10-CM | POA: Diagnosis not present

## 2021-06-28 DIAGNOSIS — F600 Paranoid personality disorder: Secondary | ICD-10-CM | POA: Diagnosis not present

## 2021-06-28 DIAGNOSIS — R443 Hallucinations, unspecified: Secondary | ICD-10-CM | POA: Diagnosis present

## 2021-06-28 LAB — RESP PANEL BY RT-PCR (FLU A&B, COVID) ARPGX2
Influenza A by PCR: NEGATIVE
Influenza B by PCR: NEGATIVE
SARS Coronavirus 2 by RT PCR: NEGATIVE

## 2021-06-28 NOTE — ED Notes (Signed)
Pt expressed unable to void at this time, will monitor.
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
## 2021-06-28 NOTE — ED Notes (Signed)
Blood draw performed, Lt & x2 Geralyn Corwin, Lav drawn, sent to lab, awaiting order for processing.

## 2021-06-29 DIAGNOSIS — F200 Paranoid schizophrenia: Secondary | ICD-10-CM | POA: Diagnosis not present

## 2021-06-29 DIAGNOSIS — F600 Paranoid personality disorder: Secondary | ICD-10-CM | POA: Diagnosis not present

## 2021-06-29 LAB — COMPREHENSIVE METABOLIC PANEL
ALT: 13 U/L (ref 0–44)
AST: 17 U/L (ref 15–41)
Albumin: 4.2 g/dL (ref 3.5–5.0)
Alkaline Phosphatase: 41 U/L (ref 38–126)
Anion gap: 7 (ref 5–15)
BUN: 16 mg/dL (ref 6–20)
CO2: 27 mmol/L (ref 22–32)
Calcium: 9.7 mg/dL (ref 8.9–10.3)
Chloride: 103 mmol/L (ref 98–111)
Creatinine, Ser: 0.51 mg/dL (ref 0.44–1.00)
GFR, Estimated: >60 mL/min (ref >60)
Glucose, Bld: 81 mg/dL (ref 70–99)
Potassium: 3.8 mmol/L (ref 3.5–5.1)
Sodium: 137 mmol/L (ref 135–145)
Total Bilirubin: 0.9 mg/dL (ref 0.3–1.2)
Total Protein: 7.4 g/dL (ref 6.5–8.1)

## 2021-06-29 LAB — CBC WITH DIFFERENTIAL/PLATELET
Abs Immature Granulocytes: 0.01 10*3/uL (ref 0.00–0.07)
Basophils Absolute: 0 10*3/uL (ref 0.0–0.1)
Basophils Relative: 0 %
Eosinophils Absolute: 0.1 10*3/uL (ref 0.0–0.5)
Eosinophils Relative: 2 %
HCT: 35.8 % — ABNORMAL LOW (ref 36.0–46.0)
Hemoglobin: 11.6 g/dL — ABNORMAL LOW (ref 12.0–15.0)
Immature Granulocytes: 0 %
Lymphocytes Relative: 41 %
Lymphs Abs: 2 10*3/uL (ref 0.7–4.0)
MCH: 29 pg (ref 26.0–34.0)
MCHC: 32.4 g/dL (ref 30.0–36.0)
MCV: 89.5 fL (ref 80.0–100.0)
Monocytes Absolute: 0.4 10*3/uL (ref 0.1–1.0)
Monocytes Relative: 9 %
Neutro Abs: 2.4 10*3/uL (ref 1.7–7.7)
Neutrophils Relative %: 48 %
Platelets: 281 10*3/uL (ref 150–400)
RBC: 4 MIL/uL (ref 3.87–5.11)
RDW: 13.2 % (ref 11.5–15.5)
WBC: 4.9 10*3/uL (ref 4.0–10.5)
nRBC: 0 % (ref 0.0–0.2)

## 2021-06-29 LAB — ETHANOL: Alcohol, Ethyl (B): <10 mg/dL (ref <10)

## 2021-06-29 LAB — URINALYSIS, ROUTINE W REFLEX MICROSCOPIC
Bacteria, UA: NONE SEEN
Bilirubin Urine: NEGATIVE
Glucose, UA: NEGATIVE mg/dL
Ketones, ur: NEGATIVE mg/dL
Nitrite: NEGATIVE
Protein, ur: 30 mg/dL — AB
RBC / HPF: >50 RBC/hpf — ABNORMAL HIGH (ref 0–5)
Specific Gravity, Urine: 1.011 (ref 1.005–1.030)
pH: 5 (ref 5.0–8.0)

## 2021-06-29 LAB — RAPID URINE DRUG SCREEN, HOSP PERFORMED
Amphetamines: NOT DETECTED
Barbiturates: NOT DETECTED
Benzodiazepines: NOT DETECTED
Cocaine: NOT DETECTED
Opiates: NOT DETECTED
Tetrahydrocannabinol: NOT DETECTED

## 2021-06-29 LAB — ACETAMINOPHEN LEVEL: Acetaminophen (Tylenol), Serum: <10 ug/mL — ABNORMAL LOW (ref 10–30)

## 2021-06-29 MED ORDER — ONDANSETRON 4 MG PO TBDP
4.0000 mg | ORAL_TABLET | Freq: Once | ORAL | Status: AC
  Administered 2021-06-29: 4 mg via ORAL
  Filled 2021-06-29: qty 1

## 2021-06-29 MED ORDER — ACETAMINOPHEN 500 MG PO TABS
1000.0000 mg | ORAL_TABLET | Freq: Once | ORAL | Status: AC
  Administered 2021-06-29: 1000 mg via ORAL
  Filled 2021-06-29: qty 2

## 2021-06-29 NOTE — ED Notes (Signed)
Appears to be sleeping- Vital signs stable.  Continuous pulse ox remains.  No distress noted when checked on the patient

## 2021-06-29 NOTE — ED Provider Notes (Signed)
Emergency Medicine Observation Re-evaluation Note  [name] [name] is a 46 y.o. female, seen on rounds today.  Pt initially presented to the ED for complaints of Psychiatric Evaluation Currently, the patient is resting quietly.  Physical Exam  BP 98/64   Pulse 68   Temp 98.9 F (37.2 C) (Oral)   Resp 18   SpO2 98%  Physical Exam General: No acute distress Cardiac: Well-perfused Lungs: Nonlabored Psych: Cooperative  ED Course / MDM  EKG:   I have reviewed the labs performed to date as well as medications administered while in observation.  Recent changes in the last 24 hours include psychiatric evaluation.  Plan  Current plan is for outpatient follow-up.  [name] [name] is not under involuntary commitment.     Terrilee Files, MD 06/29/21 [phone]
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
## 2021-06-29 NOTE — ED Notes (Signed)
Pt belongings placed in 5-8 cabinet

## 2021-06-29 NOTE — ED Notes (Signed)
Pt talking w/ TTS 

## 2021-06-29 NOTE — ED Notes (Signed)
TTS consult- received message from counselor.  They will let us know the plan

## 2021-06-29 NOTE — Discharge Instructions (Addendum)
Please follow-up with your current outpatient provider.

## 2021-06-29 NOTE — ED Notes (Signed)
Belongings removed from the room per pysch protocol- medium size black back and clothing,  Patient is in the scrubs provided
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
## 2021-06-29 NOTE — BH Assessment (Signed)
TTS clinician made request for TTS machine to be set up.

## 2021-07-02 ENCOUNTER — Other Ambulatory Visit: Payer: Self-pay

## 2021-07-02 ENCOUNTER — Emergency Department (HOSPITAL_COMMUNITY)
Admission: EM | Admit: 2021-07-02 | Discharge: 2021-07-03 | Disposition: A | Discharge status: Home / Self Care | Payer: Medicare Other | Attending: Emergency Medicine | Admitting: Emergency Medicine

## 2021-07-02 ENCOUNTER — Encounter (HOSPITAL_COMMUNITY): Payer: Self-pay | Admitting: Emergency Medicine

## 2021-07-02 DIAGNOSIS — Y900 Blood alcohol level of less than 20 mg/100 ml: Secondary | ICD-10-CM | POA: Diagnosis not present

## 2021-07-02 DIAGNOSIS — Z79899 Other long term (current) drug therapy: Secondary | ICD-10-CM | POA: Diagnosis not present

## 2021-07-02 DIAGNOSIS — F17210 Nicotine dependence, cigarettes, uncomplicated: Secondary | ICD-10-CM | POA: Diagnosis not present

## 2021-07-02 DIAGNOSIS — F200 Paranoid schizophrenia: Secondary | ICD-10-CM | POA: Diagnosis not present

## 2021-07-02 DIAGNOSIS — R443 Hallucinations, unspecified: Secondary | ICD-10-CM | POA: Diagnosis present

## 2021-07-02 DIAGNOSIS — Z20822 Contact with and (suspected) exposure to covid-19: Secondary | ICD-10-CM | POA: Diagnosis not present

## 2021-07-02 LAB — ACETAMINOPHEN LEVEL: Acetaminophen (Tylenol), Serum: <10 ug/mL — ABNORMAL LOW (ref 10–30)

## 2021-07-02 LAB — COMPREHENSIVE METABOLIC PANEL
ALT: 13 U/L (ref 0–44)
AST: 20 U/L (ref 15–41)
Albumin: 4.1 g/dL (ref 3.5–5.0)
Alkaline Phosphatase: 38 U/L (ref 38–126)
Anion gap: 5 (ref 5–15)
BUN: 10 mg/dL (ref 6–20)
CO2: 28 mmol/L (ref 22–32)
Calcium: 9.3 mg/dL (ref 8.9–10.3)
Chloride: 104 mmol/L (ref 98–111)
Creatinine, Ser: 0.53 mg/dL (ref 0.44–1.00)
GFR, Estimated: >60 mL/min (ref >60)
Glucose, Bld: 70 mg/dL (ref 70–99)
Potassium: 3.1 mmol/L — ABNORMAL LOW (ref 3.5–5.1)
Sodium: 137 mmol/L (ref 135–145)
Total Bilirubin: 0.9 mg/dL (ref 0.3–1.2)
Total Protein: 6.9 g/dL (ref 6.5–8.1)

## 2021-07-02 LAB — CBC
HCT: 32.8 % — ABNORMAL LOW (ref 36.0–46.0)
Hemoglobin: 10.7 g/dL — ABNORMAL LOW (ref 12.0–15.0)
MCH: 28.8 pg (ref 26.0–34.0)
MCHC: 32.6 g/dL (ref 30.0–36.0)
MCV: 88.4 fL (ref 80.0–100.0)
Platelets: 266 10*3/uL (ref 150–400)
RBC: 3.71 MIL/uL — ABNORMAL LOW (ref 3.87–5.11)
RDW: 12.9 % (ref 11.5–15.5)
WBC: 3.7 10*3/uL — ABNORMAL LOW (ref 4.0–10.5)
nRBC: 0 % (ref 0.0–0.2)

## 2021-07-02 LAB — ETHANOL: Alcohol, Ethyl (B): <10 mg/dL (ref <10)

## 2021-07-02 LAB — HCG, QUANTITATIVE, PREGNANCY: hCG, Beta Chain, Quant, S: <1 m[IU]/mL (ref <5)

## 2021-07-02 LAB — SALICYLATE LEVEL: Salicylate Lvl: <7 mg/dL — ABNORMAL LOW (ref 7.0–30.0)

## 2021-07-02 MED ORDER — LORAZEPAM 1 MG PO TABS
1.0000 mg | ORAL_TABLET | ORAL | Status: AC | PRN
  Administered 2021-07-02: 1 mg via ORAL
  Filled 2021-07-02: qty 1

## 2021-07-02 MED ORDER — POTASSIUM CHLORIDE CRYS ER 20 MEQ PO TBCR
40.0000 meq | EXTENDED_RELEASE_TABLET | Freq: Once | ORAL | Status: AC
  Administered 2021-07-02: 40 meq via ORAL
  Filled 2021-07-02: qty 2

## 2021-07-02 MED ORDER — ZIPRASIDONE MESYLATE 20 MG IM SOLR: 20.0000 mg | INTRAMUSCULAR | Status: DC | PRN

## 2021-07-02 MED ORDER — RISPERIDONE 1 MG PO TBDP
2.0000 mg | ORAL_TABLET | Freq: Three times a day (TID) | ORAL | Status: DC | PRN
  Administered 2021-07-02: 2 mg via ORAL
  Filled 2021-07-02: qty 2

## 2021-07-02 NOTE — ED Notes (Signed)
Pt provided burgundy scrubs. Personal belongings collected including shirt, pants, shoes, black tote bag (wallet, cell phone, other personal items, papers and books) and placed in two labeled pt belongings bags. Bags placed into pt cabinets at nursing station closest to pt room assignment. RN advised. [employer]
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
## 2021-07-03 DIAGNOSIS — F200 Paranoid schizophrenia: Secondary | ICD-10-CM | POA: Diagnosis not present

## 2021-07-03 LAB — RESP PANEL BY RT-PCR (FLU A&B, COVID) ARPGX2
Influenza A by PCR: NEGATIVE
Influenza B by PCR: NEGATIVE
SARS Coronavirus 2 by RT PCR: NEGATIVE

## 2021-07-03 LAB — RAPID URINE DRUG SCREEN, HOSP PERFORMED
Amphetamines: NOT DETECTED
Barbiturates: NOT DETECTED
Benzodiazepines: NOT DETECTED
Cocaine: NOT DETECTED
Opiates: NOT DETECTED
Tetrahydrocannabinol: NOT DETECTED

## 2021-07-03 MED ORDER — ONDANSETRON 4 MG PO TBDP
4.0000 mg | ORAL_TABLET | Freq: Three times a day (TID) | ORAL | Status: DC | PRN
Start: 2021-07-03 — End: 2021-07-03
  Filled 2021-07-03: qty 1
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
## 2021-07-03 NOTE — Consult Note (Signed)
Attempted to see patient for psychiatric reassessment.  Sent message to nurse and health techs for assistance with tele-psychiatry machine.  Will await response.

## 2021-07-03 NOTE — Consult Note (Addendum)
Telepsych Consultation   Reason for Consult:   Psychiatric Reassessment Referring Physician:  Dr. Alvira Monday Location of Patient:    [facility] Location of Provider: Other: virtual home office  Patient Identification: [name] [name] MRN:  [id number] Principal Diagnosis: Schizophrenia, paranoid type [facility]) Diagnosis:  Principal Problem:   Schizophrenia, paranoid type (HCC)   Total Time spent with patient: 30 minutes   [name] [name], 45 y.o., female patient presented to [facility] Emergency Department with delusions and hallucinations since running out to haldol.   Patient seen via telepsych by this provider; chart reviewed and consulted with Dr. Lucianne Muss on 07/03/21.  On evaluation [name] [name] reports she is feeling better today after receiving ziprasidone 20mg  IM on admission for agitation and also getting sleep.  She was restarted on home medications risperidone and haldol, states he does not like the risperidone ad this made her dizzy.  She is calm and cooperative today, no longer endorses AVH and no longer being irate or belligerent to hospital staff.   She appears embarrassed today but otherwise clear and coherent.  Aware she is in the hospital and requests a refill of haldol to be picked up at the pharmacy until she can follow-up with outpatient provider.  UDS was negative, covid 19 is negative as well.     HPI:  Per EDP Admission Assessment  Chief Complaint  Patient presents with   Delusional   Mental Health Problem      [name] [name] is a 45 y.o. female.   HPI       45 year old female with a history of paranoid schizophrenia, schizoaffective disorder, aggressive behavior, who presents with concern for hallucinations with God telling her to hurt herself.    She was evaluated 3 days ago by psychiatry and was cleared per Dr. 57 to follow-up with her outpatient provider.  She is well-known to the [facility] service due to frequent encounters related to her schizophrenia, is  reportedly delusional at baseline, with auditory hallucinations that are chronic.  She had requested discharge on this recent evaluation 3 days ago.      Today, reports she ran out of her haldol since discharge and she is feeling worse. Reports that "He" (God) is telling her to overdose on pills. That she is scared, she can't be alone and that Jesus is telling her to hit and smack herself int he face, at times until she is swollen and bruised.  She called [employer] stating that "Jesus Lucianne Muss is in my house harassing me."  She stated she wanted to be transported to be hot, but they did not have a provider and told her to come to the emergency department.  At time of my evaluation, she had denied auditory or visual hallucinations.  She denies suicidal or homicidal thoughts, but then states that "he" wants her to hurt herself. Reports she needs to get her medications straight. [facility]    Past Psychiatric History: as outlined below  Risk to Self:  no Risk to Others:  no Prior Inpatient Therapy:  yes Prior Outpatient Therapy:  yes  Past Medical History:  Past Medical History:  Diagnosis Date   Arthritis    Depression    Hallucinations    Paranoid schizophrenia (HCC)    Schizoaffective disorder (HCC)    History reviewed. No pertinent surgical history. Family History: No family history on file. Family Psychiatric  History: unknown Social History:  Social History  Substance and Sexual Activity  Alcohol Use Not Currently   Comment: She denies      Social History   Substance and Sexual Activity  Drug Use Not Currently   Comment: She denies     Social History   Socioeconomic History   Marital status: Single    Spouse name: Not on file   Number of children: Not on file   Years of education: Not on file   Highest education level: Not on file  Occupational History   Occupation: Disability  Tobacco Use   Smoking status:  Every Day    Packs/day: 1.00    Types: Cigarettes   Smokeless tobacco: Never  Vaping Use   Vaping Use: Never used  Substance and Sexual Activity   Alcohol use: Not Currently    Comment: She denies    Drug use: Not Currently    Comment: She denies    Sexual activity: Not Currently  Other Topics Concern   Not on file  Social History Narrative   Pt stated that she lives in [place], and that she lives alone.  Pt receives outpatient psychiatric resources through [employer].   Social Determinants of Health   Financial Resource Strain: Not on file  Food Insecurity: Not on file  Transportation Needs: Not on file  Physical Activity: Not on file  Stress: Not on file  Social Connections: Not on file   Additional Social History:    Allergies:  No Known Allergies  Labs:  Results for orders placed or performed during the hospital encounter of 07/02/21 (from the past 48 hour(s))  Comprehensive metabolic panel     Status: Abnormal   Collection Time: 07/02/21  8:11 PM  Result Value Ref Range   Sodium 137 135 - 145 mmol/L   Potassium 3.1 (L) 3.5 - 5.1 mmol/L   Chloride 104 98 - 111 mmol/L   CO2 28 22 - 32 mmol/L   Glucose, Bld 70 70 - 99 mg/dL    Comment: Glucose reference range applies only to samples taken after fasting for at least 8 hours.   BUN 10 6 - 20 mg/dL   Creatinine, Ser 2.95 0.44 - 1.00 mg/dL   Calcium 9.3 8.9 - 62.1 mg/dL   Total Protein 6.9 6.5 - 8.1 g/dL   Albumin 4.1 3.5 - 5.0 g/dL   AST 20 15 - 41 U/L   ALT 13 0 - 44 U/L   Alkaline Phosphatase 38 38 - 126 U/L   Total Bilirubin 0.9 0.3 - 1.2 mg/dL   GFR, Estimated >30 >86 mL/min    Comment: (NOTE) Calculated using the CKD-EPI Creatinine Equation (2021)    Anion gap 5 5 - 15    Comment: Performed at [facility], [address] [place], [place] [address]  Ethanol     Status: None   Collection Time: 07/02/21  8:11 PM  Result Value Ref Range   Alcohol, Ethyl (B) <10 <10 mg/dL     Comment: (NOTE) Lowest detectable limit for serum alcohol is 10 mg/dL.  For medical purposes only. Performed at [facility], [address] [place], [place] [address]   Salicylate level     Status: Abnormal   Collection Time: 07/02/21  8:11 PM  Result Value Ref Range   Salicylate Lvl <7.0 (L) 7.0 - 30.0 mg/dL    Comment: Performed at [facility], [address], [place], [place] [address]  Acetaminophen level     Status: Abnormal  Collection Time: 07/02/21  8:11 PM  Result Value Ref Range   Acetaminophen (Tylenol), Serum <10 (L) 10 - 30 ug/mL    Comment: (NOTE) Therapeutic concentrations vary significantly. A range of 10-30 ug/mL  may be an effective concentration for many patients. However, some  are best treated at concentrations outside of this range. Acetaminophen concentrations >150 ug/mL at 4 hours after ingestion  and >50 ug/mL at 12 hours after ingestion are often associated with  toxic reactions.  Performed at [facility], [address] [place], [place] [address]   cbc     Status: Abnormal   Collection Time: 07/02/21  8:11 PM  Result Value Ref Range   WBC 3.7 (L) 4.0 - 10.5 K/uL   RBC 3.71 (L) 3.87 - 5.11 MIL/uL   Hemoglobin 10.7 (L) 12.0 - 15.0 g/dL   HCT 60.432.8 (L) 54.036.0 - 98.146.0 %   MCV 88.4 80.0 - 100.0 fL   MCH 28.8 26.0 - 34.0 pg   MCHC 32.6 30.0 - 36.0 g/dL   RDW 19.112.9 47.811.5 - 29.515.5 %   Platelets 266 150 - 400 K/uL   nRBC 0.0 0.0 - 0.2 %    Comment: Performed at [facility], [address] [place], [place] [address]  hCG, quantitative, pregnancy     Status: None   Collection Time: 07/02/21  8:11 PM  Result Value Ref Range   hCG, Beta Chain, Quant, S <1 <5 mIU/mL    Comment:          GEST. AGE      CONC.  (mIU/mL)   <=1 WEEK        5 - 50     2 WEEKS       50 - 500     3 WEEKS       100 - 10,000     4 WEEKS     1,000 - 30,000     5 WEEKS     3,500 - 115,000   6-8 WEEKS      12,000 - 270,000    12 WEEKS     15,000 - 220,000        FEMALE AND NON-PREGNANT FEMALE:     LESS THAN 5 mIU/mL Performed at [facility], [address] [place], [place] [address]   Resp Panel by RT-PCR (Flu A&B, Covid) Nasopharyngeal Swab     Status: None   Collection Time: 07/02/21 11:11 PM   Specimen: Nasopharyngeal Swab; Nasopharyngeal(NP) swabs in vial transport medium  Result Value Ref Range   SARS Coronavirus 2 by RT PCR NEGATIVE NEGATIVE    Comment: (NOTE) SARS-CoV-2 target nucleic acids are NOT DETECTED.  The SARS-CoV-2 RNA is generally detectable in upper respiratory specimens during the acute phase of infection. The lowest concentration of SARS-CoV-2 viral copies this assay can detect is 138 copies/mL. A negative result does not preclude SARS-Cov-2 infection and should not be used as the sole basis for treatment or other patient management decisions. A negative result may occur with  improper specimen collection/handling, submission of specimen other than nasopharyngeal swab, presence of viral mutation(s) within the areas targeted by this assay, and inadequate number of viral copies(<138 copies/mL). A negative result must be combined with clinical observations, patient history, and epidemiological information. The expected result is Negative.  Fact Sheet for Patients:  BloggerCourse.comhttps://www.fda.gov/media/152166/download  Fact Sheet for Healthcare Providers:  SeriousBroker.ithttps://www.fda.gov/media/152162/download  This test is no t yet approved or cleared by the [place] FDA and  has been authorized for detection and/or diagnosis of SARS-CoV-2 by FDA under an Emergency Use Authorization (EUA). This EUA will remain  in effect (meaning this test can be used) for the duration of the COVID-19 declaration under Section 564(b)(1) of the Act, 21 U.S.C.section 360bbb-3(b)(1), unless the authorization is terminated  or revoked sooner.       Influenza A by PCR NEGATIVE  NEGATIVE   Influenza B by PCR NEGATIVE NEGATIVE    Comment: (NOTE) The Xpert Xpress SARS-CoV-2/FLU/RSV plus assay is intended as an aid in the diagnosis of influenza from Nasopharyngeal swab specimens and should not be used as a sole basis for treatment. Nasal washings and aspirates are unacceptable for Xpert Xpress SARS-CoV-2/FLU/RSV testing.  Fact Sheet for Patients: BloggerCourse.com  Fact Sheet for Healthcare Providers: SeriousBroker.it  This test is not yet approved or cleared by the [place] FDA and has been authorized for detection and/or diagnosis of SARS-CoV-2 by FDA under an Emergency Use Authorization (EUA). This EUA will remain in effect (meaning this test can be used) for the duration of the COVID-19 declaration under Section 564(b)(1) of the Act, 21 U.S.C. section 360bbb-3(b)(1), unless the authorization is terminated or revoked.  Performed at [facility], [address] [place], [place] [address]   Rapid urine drug screen (hospital performed)     Status: None   Collection Time: 07/03/21  9:28 AM  Result Value Ref Range   Opiates NONE DETECTED NONE DETECTED   Cocaine NONE DETECTED NONE DETECTED   Benzodiazepines NONE DETECTED NONE DETECTED   Amphetamines NONE DETECTED NONE DETECTED   Tetrahydrocannabinol NONE DETECTED NONE DETECTED   Barbiturates NONE DETECTED NONE DETECTED    Comment: (NOTE) DRUG SCREEN FOR MEDICAL PURPOSES ONLY.  IF CONFIRMATION IS NEEDED FOR ANY PURPOSE, NOTIFY LAB WITHIN 5 DAYS.  LOWEST DETECTABLE LIMITS FOR URINE DRUG SCREEN Drug Class                     Cutoff (ng/mL) Amphetamine and metabolites    1000 Barbiturate and metabolites    200 Benzodiazepine                 200 Tricyclics and metabolites     300 Opiates and metabolites        300 Cocaine and metabolites        300 THC                            50 Performed at [facility],  [address] [place], [place] [address]     Medications:  Current Facility-Administered Medications  Medication Dose Route Frequency Provider Last Rate Last Admin   ondansetron (ZOFRAN-ODT) disintegrating tablet 4 mg  4 mg Oral Q8H PRN Curatolo, Adam, DO       risperiDONE (RISPERDAL M-TABS) disintegrating tablet 2 mg  2 mg Oral Q8H PRN Alvira Monday, MD   2 mg at 07/02/21 2354   And   ziprasidone (GEODON) injection 20 mg  20 mg Intramuscular PRN Alvira Monday, MD       Current Outpatient Medications  Medication Sig Dispense Refill   benztropine (COGENTIN) 1 MG tablet Take 1 tablet (1 mg total) by mouth 2 (two) times daily. (Patient not taking: No sig reported) 60 tablet 0   cholecalciferol (VITAMIN D) 25 MCG tablet Take 1 tablet (1,000 Units total) by mouth daily. 7 tablet 0   famotidine (  PEPCID) 20 MG tablet Take 1 tablet (20 mg total) by mouth 2 (two) times daily. 60 tablet 0   haloperidol (HALDOL) 10 MG tablet Take 1 tablet (10 mg total) by mouth daily. 30 tablet 0   haloperidol (HALDOL) 5 MG tablet Take 3 tablets (15 mg total) by mouth at bedtime. (Patient taking differently: Take 10 mg by mouth at bedtime.) 90 tablet 0   haloperidol decanoate (HALDOL DECANOATE) 100 MG/ML injection Inject 200 mg into the muscle every 28 (twenty-eight) days.     risperiDONE (RISPERDAL M-TABS) 3 MG disintegrating tablet Take 2 tablets (6 mg total) by mouth at bedtime. (Patient not taking: No sig reported) 14 tablet 0    Musculoskeletal: Strength & Muscle Tone: within normal limits Gait & Station: normal Patient leans: N/A   Psychiatric Specialty Exam:  Presentation  General Appearance: Appropriate for Environment  Eye Contact:Fair  Speech:Clear and Coherent  Speech Volume:Decreased  Handedness:Right   Mood and Affect  Mood:-- (appears tired but cooperative)  Affect:Congruent   Thought Process  Thought Processes:Coherent; Goal Directed  Descriptions of  Associations:Intact  Orientation:Full (Time, Place and Person)  Thought Content:Logical (improved since rest and medication)  History of Schizophrenia/Schizoaffective disorder:Yes  Duration of Psychotic Symptoms:N/A  Hallucinations:Hallucinations: None  Ideas of Reference:None  Suicidal Thoughts:Suicidal Thoughts: No  Homicidal Thoughts:Homicidal Thoughts: No   Sensorium  Memory:Immediate Good; Remote Good  Judgment:Fair (appears good at the time of assessment but varies varies with med non-compliance and illicit drug use.)  Insight:Fair   Executive Functions  Concentration:Good  Attention Span:Good  Recall:Good  Fund of Knowledge:Good  Language:Good   Psychomotor Activity  Psychomotor Activity:Psychomotor Activity: Normal   Assets  Assets:Communication Skills; Housing   Sleep  Sleep:Sleep: Good Number of Hours of Sleep: 6  Physical Exam: Physical Exam Constitutional:      Appearance: Normal appearance.  HENT:     Head: Normocephalic.     Nose: Nose normal.  Eyes:     Pupils: Pupils are equal, round, and reactive to light.  Cardiovascular:     Rate and Rhythm: Tachycardia present.  Pulmonary:     Effort: Pulmonary effort is normal.  Musculoskeletal:        General: Normal range of motion.     Cervical back: Normal range of motion.  Neurological:     Mental Status: She is alert and oriented to person, place, and time.  Psychiatric:        Attention and Perception: Attention normal.        Mood and Affect: Mood normal.        Speech: Speech normal.        Behavior: Behavior normal.        Thought Content: Thought content is delusional (at baseline she had delusions, no new or worsening). Thought content does not include homicidal or suicidal ideation.        Cognition and Memory: Cognition and memory normal.   Review of Systems  Constitutional: Negative.   HENT: Negative.    Respiratory: Negative.    Cardiovascular: Negative.    Gastrointestinal: Negative.   Genitourinary: Negative.   Musculoskeletal: Negative.   Neurological: Negative.   Endo/Heme/Allergies: Negative.   Psychiatric/Behavioral:  Negative for hallucinations (improved since admission), substance abuse and suicidal ideas.   Blood pressure 114/74, pulse (!) 115, temperature 97.8 F (36.6 C), resp. rate 18, height 4\' 10"  (1.473 m), weight 45.4 kg, SpO2 100 %. Body mass index is 20.9 kg/m.  Treatment Plan Summary: Plan- As per above  assessment, there are no current grounds for involuntary commitment at this time.  She will follow-up with [facility] tomorrow as a walk in for medication management.   Patient is not currently interested in inpatient services but expresses agreement to continue outpatient treatment. We also reviewed the importance of medication compliance.  Disposition: No evidence of imminent risk to self or others at present.   Patient does not meet criteria for psychiatric inpatient admission. Supportive therapy provided about ongoing stressors. Discussed crisis plan, support from social network, calling 911, coming to the Emergency Department, and calling Suicide Hotline.  Dr. Daun Peacock, EDP: Gwenevere Ghazi, LCSWA all informed of above recommendation and disposition via Epic chat.   This service was provided via telemedicine using a 2-way, interactive audio and video technology.  Names of all persons participating in this telemedicine service and their role in this encounter. Name: Cam Harnden Role: Patient  Name: Ophelia Shoulder Role: PMHNP    Chales Abrahams, NP 07/03/2021 5:20 PM

## 2021-07-03 NOTE — ED Notes (Addendum)
Pt said she's feeling sick from the medication. RN notified.

## 2021-07-03 NOTE — ED Provider Notes (Signed)
Cleared by psych.  Discharge.   Virgina Norfolk, DO 07/03/21 1212

## 2021-07-03 NOTE — ED Notes (Signed)
TTS machine placed in pt room  °

## 2021-07-06 ENCOUNTER — Other Ambulatory Visit: Payer: Self-pay

## 2021-07-06 ENCOUNTER — Encounter (HOSPITAL_COMMUNITY): Payer: Self-pay | Admitting: Physician Assistant

## 2021-07-06 ENCOUNTER — Ambulatory Visit (INDEPENDENT_AMBULATORY_CARE_PROVIDER_SITE_OTHER): Payer: Medicare Other | Admitting: Physician Assistant

## 2021-07-06 VITALS — BP 123/78 | HR 83 | Ht 59.0 in | Wt 96.0 lb

## 2021-07-06 DIAGNOSIS — F200 Paranoid schizophrenia: Secondary | ICD-10-CM

## 2021-07-06 MED ORDER — HALOPERIDOL 10 MG PO TABS
10.0000 mg | ORAL_TABLET | Freq: Every day | ORAL | 1 refills | Status: AC
  Filled 2021-07-06: qty 30, 30d supply, fill #0

## 2021-07-06 MED ORDER — BENZTROPINE MESYLATE 1 MG PO TABS
1.0000 mg | ORAL_TABLET | Freq: Two times a day (BID) | ORAL | 1 refills | Status: AC
  Filled 2021-07-06: qty 60, 30d supply, fill #0

## 2021-07-06 MED ORDER — HALOPERIDOL 5 MG PO TABS
15.0000 mg | ORAL_TABLET | Freq: Every day | ORAL | 1 refills | Status: AC
  Filled 2021-07-06: qty 90, 30d supply, fill #0
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
## 2021-07-14 ENCOUNTER — Emergency Department (HOSPITAL_COMMUNITY)
Admission: EM | Admit: 2021-07-14 | Discharge: 2021-07-14 | Disposition: A | Discharge status: Home / Self Care | Payer: Medicare Other | Attending: Emergency Medicine | Admitting: Emergency Medicine

## 2021-07-14 ENCOUNTER — Other Ambulatory Visit: Payer: Self-pay

## 2021-07-14 ENCOUNTER — Encounter (HOSPITAL_COMMUNITY): Payer: Self-pay

## 2021-07-14 DIAGNOSIS — R110 Nausea: Secondary | ICD-10-CM

## 2021-07-14 DIAGNOSIS — R102 Pelvic and perineal pain: Secondary | ICD-10-CM | POA: Diagnosis not present

## 2021-07-14 DIAGNOSIS — R112 Nausea with vomiting, unspecified: Secondary | ICD-10-CM | POA: Diagnosis not present

## 2021-07-14 DIAGNOSIS — T7421XA Adult sexual abuse, confirmed, initial encounter: Secondary | ICD-10-CM | POA: Diagnosis not present

## 2021-07-14 DIAGNOSIS — F17210 Nicotine dependence, cigarettes, uncomplicated: Secondary | ICD-10-CM | POA: Diagnosis not present

## 2021-07-14 DIAGNOSIS — Z79899 Other long term (current) drug therapy: Secondary | ICD-10-CM | POA: Diagnosis not present

## 2021-07-14 LAB — RAPID URINE DRUG SCREEN, HOSP PERFORMED
Amphetamines: NOT DETECTED
Barbiturates: NOT DETECTED
Benzodiazepines: NOT DETECTED
Cocaine: NOT DETECTED
Opiates: NOT DETECTED
Tetrahydrocannabinol: NOT DETECTED

## 2021-07-14 MED ORDER — ONDANSETRON HCL 4 MG PO TABS: 4.0000 mg | ORAL_TABLET | Freq: Four times a day (QID) | ORAL | 0 refills | Status: AC

## 2021-07-14 MED ORDER — ONDANSETRON 4 MG PO TBDP
4.0000 mg | ORAL_TABLET | Freq: Once | ORAL | Status: AC
  Administered 2021-07-14: 4 mg via ORAL
  Filled 2021-07-14: qty 1

## 2021-07-14 NOTE — ED Notes (Signed)
Patient is refusing labs and Covid swab. She is eating solid food now with no problem
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
## 2021-07-14 NOTE — ED Notes (Signed)
Patient has refused VS, labs and Covid swab

## 2021-07-14 NOTE — ED Notes (Signed)
Patient given a bus pass. 
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
## 2021-07-14 NOTE — Discharge Instructions (Signed)
Take the medications as needed for nausea.  Follow-up with your primary care or mental health doctors as needed

## 2021-07-16 ENCOUNTER — Emergency Department (HOSPITAL_COMMUNITY)
Admission: EM | Admit: 2021-07-16 | Discharge: 2021-07-17 | Disposition: A | Discharge status: Home / Self Care | Payer: Medicare Other | Attending: Student | Admitting: Student

## 2021-07-16 DIAGNOSIS — R531 Weakness: Secondary | ICD-10-CM | POA: Diagnosis not present

## 2021-07-16 DIAGNOSIS — F200 Paranoid schizophrenia: Secondary | ICD-10-CM | POA: Diagnosis not present

## 2021-07-16 DIAGNOSIS — Z20822 Contact with and (suspected) exposure to covid-19: Secondary | ICD-10-CM | POA: Insufficient documentation

## 2021-07-16 DIAGNOSIS — N9489 Other specified conditions associated with female genital organs and menstrual cycle: Secondary | ICD-10-CM | POA: Insufficient documentation

## 2021-07-16 DIAGNOSIS — R443 Hallucinations, unspecified: Secondary | ICD-10-CM | POA: Diagnosis not present

## 2021-07-16 DIAGNOSIS — F17210 Nicotine dependence, cigarettes, uncomplicated: Secondary | ICD-10-CM | POA: Insufficient documentation

## 2021-07-16 DIAGNOSIS — F209 Schizophrenia, unspecified: Secondary | ICD-10-CM | POA: Diagnosis not present

## 2021-07-16 DIAGNOSIS — M6281 Muscle weakness (generalized): Secondary | ICD-10-CM | POA: Diagnosis not present

## 2021-07-16 DIAGNOSIS — M25552 Pain in left hip: Secondary | ICD-10-CM | POA: Insufficient documentation

## 2021-07-16 DIAGNOSIS — F22 Delusional disorders: Secondary | ICD-10-CM

## 2021-07-16 DIAGNOSIS — F600 Paranoid personality disorder: Secondary | ICD-10-CM | POA: Diagnosis not present

## 2021-07-16 LAB — COMPREHENSIVE METABOLIC PANEL
ALT: 14 U/L (ref 0–44)
AST: 26 U/L (ref 15–41)
Albumin: 4.1 g/dL (ref 3.5–5.0)
Alkaline Phosphatase: 37 U/L — ABNORMAL LOW (ref 38–126)
Anion gap: 9 (ref 5–15)
BUN: 13 mg/dL (ref 6–20)
CO2: 26 mmol/L (ref 22–32)
Calcium: 8.9 mg/dL (ref 8.9–10.3)
Chloride: 103 mmol/L (ref 98–111)
Creatinine, Ser: 0.59 mg/dL (ref 0.44–1.00)
GFR, Estimated: >60 mL/min (ref >60)
Glucose, Bld: 70 mg/dL (ref 70–99)
Potassium: 4.2 mmol/L (ref 3.5–5.1)
Sodium: 138 mmol/L (ref 135–145)
Total Bilirubin: 0.8 mg/dL (ref 0.3–1.2)
Total Protein: 7.2 g/dL (ref 6.5–8.1)

## 2021-07-16 LAB — CBC WITH DIFFERENTIAL/PLATELET
Abs Immature Granulocytes: 0.01 10*3/uL (ref 0.00–0.07)
Basophils Absolute: 0 10*3/uL (ref 0.0–0.1)
Basophils Relative: 0 %
Eosinophils Absolute: 0 10*3/uL (ref 0.0–0.5)
Eosinophils Relative: 0 %
HCT: 36 % (ref 36.0–46.0)
Hemoglobin: 11.2 g/dL — ABNORMAL LOW (ref 12.0–15.0)
Immature Granulocytes: 0 %
Lymphocytes Relative: 38 %
Lymphs Abs: 1.8 10*3/uL (ref 0.7–4.0)
MCH: 28.3 pg (ref 26.0–34.0)
MCHC: 31.1 g/dL (ref 30.0–36.0)
MCV: 90.9 fL (ref 80.0–100.0)
Monocytes Absolute: 0.5 10*3/uL (ref 0.1–1.0)
Monocytes Relative: 10 %
Neutro Abs: 2.5 10*3/uL (ref 1.7–7.7)
Neutrophils Relative %: 52 %
Platelets: 302 10*3/uL (ref 150–400)
RBC: 3.96 MIL/uL (ref 3.87–5.11)
RDW: 13.4 % (ref 11.5–15.5)
WBC: 4.8 10*3/uL (ref 4.0–10.5)
nRBC: 0 % (ref 0.0–0.2)

## 2021-07-16 LAB — RESP PANEL BY RT-PCR (FLU A&B, COVID) ARPGX2
Influenza A by PCR: NEGATIVE
Influenza B by PCR: NEGATIVE
SARS Coronavirus 2 by RT PCR: NEGATIVE

## 2021-07-16 LAB — I-STAT BETA HCG BLOOD, ED (MC, WL, AP ONLY): I-stat hCG, quantitative: <5 m[IU]/mL (ref <5)

## 2021-07-16 LAB — ETHANOL: Alcohol, Ethyl (B): <10 mg/dL (ref <10)

## 2021-07-16 MED ORDER — BENZTROPINE MESYLATE 1 MG PO TABS
1.0000 mg | ORAL_TABLET | Freq: Two times a day (BID) | ORAL | Status: DC
  Administered 2021-07-16 – 2021-07-17 (×2): 1 mg via ORAL
  Filled 2021-07-16: qty 1

## 2021-07-16 MED ORDER — ONDANSETRON HCL 4 MG PO TABS: 4.0000 mg | ORAL_TABLET | Freq: Three times a day (TID) | ORAL | Status: DC | PRN

## 2021-07-16 MED ORDER — RISPERIDONE 0.5 MG PO TBDP
2.0000 mg | ORAL_TABLET | Freq: Three times a day (TID) | ORAL | Status: DC | PRN
Start: 2021-07-16 — End: 2021-07-17

## 2021-07-16 MED ORDER — LORAZEPAM 1 MG PO TABS: 1.0000 mg | ORAL_TABLET | ORAL | Status: DC | PRN

## 2021-07-16 MED ORDER — ALUM & MAG HYDROXIDE-SIMETH 200-200-20 MG/5ML PO SUSP: 30.0000 mL | Freq: Four times a day (QID) | ORAL | Status: DC | PRN

## 2021-07-16 MED ORDER — ZOLPIDEM TARTRATE 5 MG PO TABS: 5.0000 mg | ORAL_TABLET | Freq: Every evening | ORAL | Status: DC | PRN

## 2021-07-16 MED ORDER — HALOPERIDOL 5 MG PO TABS
15.0000 mg | ORAL_TABLET | Freq: Every day | ORAL | Status: DC
  Administered 2021-07-16: 15 mg via ORAL
  Filled 2021-07-16: qty 3

## 2021-07-16 MED ORDER — ACETAMINOPHEN 325 MG PO TABS
650.0000 mg | ORAL_TABLET | ORAL | Status: DC | PRN
Start: 2021-07-16 — End: 2021-07-17

## 2021-07-16 MED ORDER — ZIPRASIDONE MESYLATE 20 MG IM SOLR: 20.0000 mg | INTRAMUSCULAR | Status: DC | PRN
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
## 2021-07-16 NOTE — ED Triage Notes (Signed)
Pt to ED via EMS from home, Initially pt called PD because she was paranoid and believed someone was trying to kill her, pt then requesting EMS assistance and to come to hospital for weakness that started today. Pt is vague in her complaint. Last VS 110/72, hr 90, 99% cbg 106. No medications given by EMS. AY:[id number] health
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
## 2021-07-17 DIAGNOSIS — F200 Paranoid schizophrenia: Secondary | ICD-10-CM | POA: Diagnosis not present

## 2021-07-17 DIAGNOSIS — R443 Hallucinations, unspecified: Secondary | ICD-10-CM | POA: Diagnosis not present

## 2021-07-17 DIAGNOSIS — F209 Schizophrenia, unspecified: Secondary | ICD-10-CM | POA: Diagnosis not present

## 2021-07-17 LAB — RAPID URINE DRUG SCREEN, HOSP PERFORMED
Amphetamines: NOT DETECTED
Barbiturates: NOT DETECTED
Benzodiazepines: NOT DETECTED
Cocaine: NOT DETECTED
Opiates: NOT DETECTED
Tetrahydrocannabinol: NOT DETECTED

## 2021-07-17 NOTE — Progress Notes (Addendum)
Transition of Care [facility]) - Emergency Department Mini Assessment   Patient Details  Name: [name] [name] MRN: [id number] Date of Birth: 10-25-1976  Transition of [facility]) CM/SW Contact:    Lossie Faes Tarpley-Carter, LCSWA Phone Number: 07/17/2021, 2:24 PM   Clinical Narrative: TOC CSW consulted with pt.  Pt is in need of transportation home.  Pt states she is familiar with transit system but would prefer an [place] home.  Shaunte Tuft Tarpley-Carter, MSW, LCSW-A Pronouns:  She/Her/Hers [facility] Clinical Social Worker Direct Number:  [phone] [email or web address]  ED Mini Assessment: What brought you to the Emergency Department? : Weakness and paranoia  Barriers to Discharge: No Barriers Identified     Means of departure: Public Transportation       Patient Contact and Communications       Contact Date: 07/17/21,          Patient states their goals for this hospitalization and ongoing recovery are:: Pt needs a ride home.   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 [place] [address] Phone: [phone] Fax: [phone]

## 2021-07-17 NOTE — ED Provider Notes (Signed)
Emergency Medicine Observation Re-evaluation Note  [name] [name] is a 45 y.o. female, seen on rounds today.  Pt initially presented to the ED for complaints of Weakness and Paranoid Currently, the patient is alert, no apparent distress, resting comfortably.  Physical Exam  BP 109/63   Pulse 81   Temp 98.1 F (36.7 C) (Oral)   Resp 16   Ht 4\' 10"  (1.473 m)   Wt 44 kg   LMP 06/30/2021 (Approximate)   SpO2 99%   BMI 20.27 kg/m  Physical Exam General: Alert, no apparent distress, resting comfortably Lungs: Respirations even and unlabored Psych: Appears stable  ED Course / MDM  EKG:   I have reviewed the labs performed to date as well as medications administered while in observation.  Recent changes in the last 24 hours include TTS evaluated the patient yesterday 9/10 and recommended that the patient be observed and monitored..  Plan  Current plan is for follow-up TTS recommendations.  [name] [name] is not under involuntary commitment.     [name], MD 07/17/21 [phone]

## 2021-07-17 NOTE — ED Notes (Addendum)
0829:Pt singing loudly with door opened. When this writer went to go check on the pt, the pt is on her bed. This Clinical research associate asked if everything was okay and she said "get me a doctor. I need to talk to them".    5038: Pt continues to come out of her room asking for something for her cold, throat and her cough. RN is aware of pt continuously coming out asking for something for her throat.

## 2021-07-17 NOTE — Progress Notes (Signed)
TOC CSW contacted Psychologist, educational Randa Evens).  Randa Evens stated she is the only driver for today and she is currently picking up her last client for today at [facility].  Therefore, CSW will have to offer pt a bus pass.  CSW explained the situation to pt.  Pt is understanding and states she can ride the bus.  [name] [name], MSW, LCSW-A Pronouns:  She/Her/Hers [facility] Clinical Social Worker Direct Number:  [phone] [email or web address]

## 2021-07-17 NOTE — ED Notes (Signed)
Pt has continued to yell loudly at the top of her lungs. When asked if she needs anything, pt responds with "I have a cold, get me the nurse". RN is aware of pt's behavior

## 2021-07-17 NOTE — Progress Notes (Signed)
CSW reached out to the [facility] to verify if the patient has a Care Coordinator to assist with further connecting the patient to additional services, at the providers request. It was reported that the patient's care-coordinator is Stacy Horne/Phone#: [phone] ext: [phone]. E-mail [email or web address]. CSW was connected to [place] who advised that she will give me a call back.  Crissie Reese, MSW, LCSW-A, LCAS-A Phone: [phone] Disposition/TOC
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
## 2021-07-21 ENCOUNTER — Other Ambulatory Visit: Payer: Self-pay

## 2021-07-21 ENCOUNTER — Emergency Department (HOSPITAL_COMMUNITY)
Admission: EM | Admit: 2021-07-21 | Discharge: 2021-07-21 | Disposition: A | Discharge status: Home / Self Care | Payer: Medicare Other | Attending: Emergency Medicine | Admitting: Emergency Medicine

## 2021-07-21 ENCOUNTER — Encounter (HOSPITAL_COMMUNITY): Payer: Self-pay | Admitting: Emergency Medicine

## 2021-07-21 DIAGNOSIS — J069 Acute upper respiratory infection, unspecified: Secondary | ICD-10-CM | POA: Insufficient documentation

## 2021-07-21 DIAGNOSIS — R059 Cough, unspecified: Secondary | ICD-10-CM | POA: Diagnosis not present

## 2021-07-21 DIAGNOSIS — B9789 Other viral agents as the cause of diseases classified elsewhere: Secondary | ICD-10-CM | POA: Diagnosis not present

## 2021-07-21 MED ORDER — ONDANSETRON 4 MG PO TBDP
4.0000 mg | ORAL_TABLET | Freq: Once | ORAL | Status: AC
  Administered 2021-07-21: 4 mg via ORAL
  Filled 2021-07-21: qty 1

## 2021-07-21 MED ORDER — BENZONATATE 100 MG PO CAPS
100.0000 mg | ORAL_CAPSULE | Freq: Three times a day (TID) | ORAL | 0 refills | Status: AC | PRN
  Filled 2021-07-21: qty 21, 7d supply, fill #0

## 2021-07-21 MED ORDER — FLUTICASONE PROPIONATE 50 MCG/ACT NA SUSP
1.0000 | Freq: Every day | NASAL | Status: DC
  Administered 2021-07-21: 1 via NASAL
  Filled 2021-07-21: qty 16

## 2021-07-21 MED ORDER — BENZONATATE 100 MG PO CAPS
100.0000 mg | ORAL_CAPSULE | Freq: Once | ORAL | Status: AC
  Administered 2021-07-21: 100 mg via ORAL
  Filled 2021-07-21: qty 1

## 2021-07-21 MED ORDER — LORATADINE 10 MG PO TABS
10.0000 mg | ORAL_TABLET | Freq: Once | ORAL | Status: AC
  Administered 2021-07-21: 10 mg via ORAL
  Filled 2021-07-21: qty 1

## 2021-07-21 NOTE — ED Notes (Signed)
Pt given water for fluid challenge. Pt states she will let me know how she does with the fluids.

## 2021-07-21 NOTE — ED Notes (Signed)
Pt states she has had no problems drinking water.

## 2021-07-21 NOTE — ED Triage Notes (Signed)
Pt arrived via EMS. Pt has a productive cough and cold like symptoms for the past week. Per EMS pt has clear lung sounds.  BP 138/74 HR 87 SpO2 97 Temp 97.5

## 2021-07-21 NOTE — ED Provider Notes (Signed)
[facility]-EMERGENCY DEPT Provider Note   CSN: [id number] Arrival date & time: 07/21/21  0302     History Chief Complaint  Patient presents with   Cough    [name] [name] is a 45 y.o. female.  45 year old female with a history of depression, schizoaffective disorder, hallucinations presents to the ED for URI symptoms. Reports they have been ongoing x 2 weeks. C/o nasal congestion and postnasal drip with cough. Cough productive of mucous. She has had some episodes of vomiting as well; emesis c/w mucous as well. No medications taken PTA for symptoms. Patient denies associated fevers, sick contacts. Hx of frequent ED visits, last evaluated 4 days ago.  The history is provided by the patient. No language interpreter was used.  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 No pertinent surgical history.   OB History   No obstetric history on file.     History reviewed. No pertinent family history.  Social History   Tobacco Use   Smoking status: Never   Smokeless tobacco: Never  Vaping Use   Vaping Use: Never used  Substance Use Topics   Alcohol use: Not Currently    Comment: She denies    Drug use: Not Currently    Comment: She denies     Home Medications Prior to Admission medications   Medication Sig Start Date End Date Taking? Authorizing Provider  benzonatate (TESSALON) 100 MG capsule Take 1 capsule (100 mg total) by mouth 3 (three) times daily as needed for cough. 07/21/21  Yes Antony Madura, PA-C  benztropine  (COGENTIN) 1 MG tablet Take 1 tablet (1 mg total) by mouth 2 (two) times daily. 07/06/21   Nwoko, Tommas Olp, PA  cholecalciferol (VITAMIN D) 25 MCG tablet Take 1 tablet (1,000 Units total) by mouth daily. Patient not taking: No sig reported 06/06/21   Ardis Hughs, NP  Ensure (ENSURE) Take 237 mLs by mouth 2 (two) times daily between meals.    [provider]  famotidine (PEPCID) 20 MG tablet Take 1 tablet (20 mg total) by mouth 2 (two) times daily. Patient not taking: No sig reported 03/21/21   Laveda Abbe, NP  haloperidol (HALDOL) 10 MG tablet Take 1 tablet (10 mg total) by mouth daily. 07/06/21   Nwoko, Tommas Olp, PA  haloperidol (HALDOL) 5 MG tablet Take 3 tablets (15 mg total) by mouth at bedtime. 07/06/21   Nwoko, Tommas Olp, PA  Multiple Vitamin (MULTIVITAMIN WITH MINERALS) TABS tablet Take 1 tablet by mouth daily.    [provider]  ondansetron (ZOFRAN) 4 MG tablet Take 1 tablet (4 mg total) by mouth every 6 (six) hours. Patient not taking: No sig reported 07/14/21   Linwood Dibbles, MD    Allergies    Patient has no known allergies.  Review of Systems   Review of Systems  Respiratory:  Positive for cough.   Ten systems reviewed and are negative for acute change, except as noted in the HPI.    Physical Exam Updated Vital  Signs BP 105/77   Pulse 94   Temp 98 F (36.7 C) (Oral)   Resp 18   Ht 4\' 10"  (1.473 m)   Wt 44 kg   LMP 06/30/2021 (Approximate)   SpO2 99%   BMI 20.27 kg/m   Physical Exam Vitals and nursing note reviewed.  Constitutional:      General: She is not in acute distress.    Appearance: She is well-developed. She is not diaphoretic.     Comments: Nontoxic appearing. Wearing nightgown.  HENT:     Head: Normocephalic and atraumatic.     Nose: Congestion present. No rhinorrhea.  Eyes:     General: No scleral icterus.    Conjunctiva/sclera: Conjunctivae normal.  Pulmonary:     Effort: Pulmonary effort is normal. No respiratory  distress.     Breath sounds: No stridor. No wheezing, rhonchi or rales.     Comments: Lungs clear to auscultation bilaterally Musculoskeletal:        General: Normal range of motion.     Cervical back: Normal range of motion.  Skin:    General: Skin is warm and dry.     Coloration: Skin is not pale.     Findings: No erythema or rash.  Neurological:     Mental Status: She is alert and oriented to person, place, and time.     Coordination: Coordination normal.  Psychiatric:        Behavior: Behavior normal.    ED Results / Procedures / Treatments   Labs (all labs ordered are listed, but only abnormal results are displayed) Labs Reviewed - No data to display  EKG None  Radiology No results found.  Procedures Procedures   Medications Ordered in ED Medications  fluticasone (FLONASE) 50 MCG/ACT nasal spray 1 spray (1 spray Each Nare Given 07/21/21 0335)  loratadine (CLARITIN) tablet 10 mg (10 mg Oral Given 07/21/21 0334)  benzonatate (TESSALON) capsule 100 mg (100 mg Oral Given 07/21/21 0334)  ondansetron (ZOFRAN-ODT) disintegrating tablet 4 mg (4 mg Oral Given 07/21/21 0335)    ED Course  I have reviewed the triage vital signs and the nursing notes.  Pertinent labs & imaging results that were available during my care of the patient were reviewed by me and considered in my medical decision making (see chart for details).  Clinical Course as of 07/21/21 0628  Thu Jul 21, 2021  0454 Tolerating PO fluids. No vomiting. [KH]    Clinical Course User Index [KH] Jul 23, 2021, PA-C   MDM Rules/Calculators/A&P                           Patient's symptoms are consistent with URI, likely viral etiology. Discussed that antibiotics are not indicated for viral infections. Pt will be discharged with symptomatic treatment.  She verbalizes understanding and is agreeable with plan. Pt is hemodynamically stable and in NAD prior to discharge.   Final Clinical Impression(s) / ED  Diagnoses Final diagnoses:  Viral URI with cough    Rx / DC Orders ED Discharge Orders          Ordered    benzonatate (TESSALON) 100 MG capsule  3 times daily PRN        07/21/21 0454             07/23/21, PA-C 07/21/21 07/23/21    [id number], MD 07/21/21 [phone]

## 2021-07-21 NOTE — Discharge Instructions (Addendum)
Use 1 spray flonase in each nostril for congestion. Take Tessalon as prescribed for cough. Follow up with a primary care doctor.

## 2021-07-23 ENCOUNTER — Other Ambulatory Visit: Payer: Self-pay

## 2021-07-23 ENCOUNTER — Encounter (HOSPITAL_COMMUNITY): Payer: Self-pay

## 2021-07-23 ENCOUNTER — Emergency Department (HOSPITAL_COMMUNITY)
Admission: EM | Admit: 2021-07-23 | Discharge: 2021-07-23 | Disposition: A | Discharge status: Home / Self Care | Payer: Medicare Other | Attending: Emergency Medicine | Admitting: Emergency Medicine

## 2021-07-23 DIAGNOSIS — R110 Nausea: Secondary | ICD-10-CM | POA: Diagnosis not present

## 2021-07-23 DIAGNOSIS — Z046 Encounter for general psychiatric examination, requested by authority: Secondary | ICD-10-CM | POA: Insufficient documentation

## 2021-07-23 DIAGNOSIS — R4182 Altered mental status, unspecified: Secondary | ICD-10-CM | POA: Insufficient documentation

## 2021-07-23 DIAGNOSIS — Z5321 Procedure and treatment not carried out due to patient leaving prior to being seen by health care provider: Secondary | ICD-10-CM | POA: Insufficient documentation

## 2021-07-23 DIAGNOSIS — R112 Nausea with vomiting, unspecified: Secondary | ICD-10-CM | POA: Diagnosis not present

## 2021-07-23 DIAGNOSIS — G4489 Other headache syndrome: Secondary | ICD-10-CM | POA: Diagnosis not present

## 2021-07-23 DIAGNOSIS — R5381 Other malaise: Secondary | ICD-10-CM | POA: Diagnosis not present

## 2021-07-23 DIAGNOSIS — R519 Headache, unspecified: Secondary | ICD-10-CM | POA: Diagnosis not present
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
## 2021-07-28 ENCOUNTER — Emergency Department (HOSPITAL_COMMUNITY)
Admission: EM | Admit: 2021-07-28 | Discharge: 2021-07-28 | Disposition: A | Discharge status: Home / Self Care | Payer: Medicare Other | Attending: Emergency Medicine | Admitting: Emergency Medicine

## 2021-07-28 ENCOUNTER — Emergency Department (HOSPITAL_COMMUNITY): Payer: Medicare Other

## 2021-07-28 ENCOUNTER — Other Ambulatory Visit: Payer: Self-pay

## 2021-07-28 DIAGNOSIS — R079 Chest pain, unspecified: Secondary | ICD-10-CM | POA: Diagnosis not present

## 2021-07-28 DIAGNOSIS — R42 Dizziness and giddiness: Secondary | ICD-10-CM | POA: Insufficient documentation

## 2021-07-28 DIAGNOSIS — R442 Other hallucinations: Secondary | ICD-10-CM | POA: Diagnosis not present

## 2021-07-28 DIAGNOSIS — R55 Syncope and collapse: Secondary | ICD-10-CM | POA: Insufficient documentation

## 2021-07-28 DIAGNOSIS — R456 Violent behavior: Secondary | ICD-10-CM | POA: Diagnosis not present

## 2021-07-28 LAB — CBC
HCT: 33.2 % — ABNORMAL LOW (ref 36.0–46.0)
Hemoglobin: 10.8 g/dL — ABNORMAL LOW (ref 12.0–15.0)
MCH: 28.6 pg (ref 26.0–34.0)
MCHC: 32.5 g/dL (ref 30.0–36.0)
MCV: 88.1 fL (ref 80.0–100.0)
Platelets: 244 10*3/uL (ref 150–400)
RBC: 3.77 MIL/uL — ABNORMAL LOW (ref 3.87–5.11)
RDW: 13.2 % (ref 11.5–15.5)
WBC: 4.4 10*3/uL (ref 4.0–10.5)
nRBC: 0 % (ref 0.0–0.2)

## 2021-07-28 LAB — LIPASE, BLOOD: Lipase: 28 U/L (ref 11–51)

## 2021-07-28 LAB — COMPREHENSIVE METABOLIC PANEL
ALT: 14 U/L (ref 0–44)
AST: 18 U/L (ref 15–41)
Albumin: 4.1 g/dL (ref 3.5–5.0)
Alkaline Phosphatase: 41 U/L (ref 38–126)
Anion gap: 8 (ref 5–15)
BUN: 10 mg/dL (ref 6–20)
CO2: 26 mmol/L (ref 22–32)
Calcium: 9.5 mg/dL (ref 8.9–10.3)
Chloride: 103 mmol/L (ref 98–111)
Creatinine, Ser: 0.48 mg/dL (ref 0.44–1.00)
GFR, Estimated: >60 mL/min (ref >60)
Glucose, Bld: 82 mg/dL (ref 70–99)
Potassium: 3.6 mmol/L (ref 3.5–5.1)
Sodium: 137 mmol/L (ref 135–145)
Total Bilirubin: 0.7 mg/dL (ref 0.3–1.2)
Total Protein: 7.2 g/dL (ref 6.5–8.1)

## 2021-07-28 LAB — TROPONIN I (HIGH SENSITIVITY): Troponin I (High Sensitivity): 2 ng/L (ref <18)

## 2021-07-28 MED ORDER — SODIUM CHLORIDE 0.9 % IV BOLUS
1000.0000 mL | Freq: Once | INTRAVENOUS | Status: AC
  Administered 2021-07-28: 1000 mL via INTRAVENOUS

## 2021-07-28 NOTE — ED Notes (Signed)
Pt has refused EKG and cardiac monitoring

## 2021-07-28 NOTE — ED Notes (Signed)
Pt demanding staff pay for [name] [name] to take her home after she was told we do not have any bus passes.

## 2021-07-28 NOTE — ED Provider Notes (Signed)
[facility]-EMERGENCY DEPT Provider Note   CSN: [id number] Arrival date & time: 07/28/21  1736     History Chief Complaint  Patient presents with   Dizziness    Pt arriving via EMS for lightheaded feeling and chest pain that began approx 3 hours ago. EMS had tried to bring pt in earlier but she became combative and had to be removed from the truck. [employer] offered to bring her in and she refused. Pt then called EMS again. Pt is calm and cooperative at this time but she is having auditory hallucinations and responding to them. Pt denies SI/HI at this time. Says she wanted to be seen because she felt like she was going to pass out. Pt also reports being out of her me   Psychiatric Evaluation    [name] [name] is a 45 y.o. female.  States that she wants to be checked out due to feeling faint, having a brief episode of chest pain, and now having LUQ abdominal pain.  The history is provided by the patient.  Dizziness Quality:  Lightheadedness ("I feel faint") Severity:  Moderate Onset quality:  Sudden Duration:  4 hours Timing:  Constant Progression:  Partially resolved Chronicity:  New Context comment:  No known cause. She was in [employer] when it happened. 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   OB History   No obstetric history on file.     No family history on file.  Social History   Tobacco Use   Smoking status: Never   Smokeless tobacco: Never  Vaping Use   Vaping Use: Never used  Substance Use Topics   Alcohol use: Not Currently    Comment: She denies    Drug use: Not Currently    Comment: She denies     Home Medications Prior to Admission medications   Medication Sig Start Date End Date Taking? Authorizing Provider  benzonatate (TESSALON) 100 MG capsule Take 1 capsule (100 mg total) by mouth 3 (three) times daily as needed for cough. 07/21/21   Antony Madura, PA-C  benztropine (COGENTIN) 1 MG tablet Take 1 tablet (1 mg total) by mouth 2 (two) times daily. 07/06/21   Nwoko, Tommas Olp, PA  cholecalciferol (VITAMIN D) 25 MCG tablet Take 1 tablet (1,000 Units total) by mouth daily. Patient not taking: No sig reported 06/06/21   Ardis Hughs, NP  Ensure (ENSURE) Take 237 mLs by mouth 2 (two) times daily between meals.    [provider]  famotidine (PEPCID) 20 MG tablet Take 1 tablet (20 mg total) by mouth 2 (two) times daily. Patient not taking: No sig reported 03/21/21   Laveda Abbe, NP  haloperidol (HALDOL) 10 MG tablet Take 1 tablet (10 mg total) by mouth daily. 07/06/21  Nwoko, Uchenna E, PA  haloperidol (HALDOL) 5 MG tablet Take 3 tablets (15 mg total) by mouth at bedtime. 07/06/21   Nwoko, Tommas Olp, PA  Multiple Vitamin (MULTIVITAMIN WITH MINERALS) TABS tablet Take 1 tablet by mouth daily.    [provider]  ondansetron (ZOFRAN) 4 MG tablet Take 1 tablet (4 mg total) by mouth every 6 (six) hours. Patient not taking: No sig reported 07/14/21   Linwood Dibbles, MD    Allergies    Patient has no known allergies.  Review of Systems   Review of Systems  Constitutional:  Negative for chills and fever.  HENT:  Negative for ear pain and sore throat.   Eyes:  Negative for pain and visual disturbance.  Respiratory:  Negative for  cough and shortness of breath.   Cardiovascular:  Positive for chest pain ("slight"). Negative for palpitations.  Gastrointestinal:  Positive for abdominal pain (left upper quadrant, "like a ball") and nausea. Negative for diarrhea and vomiting.  Genitourinary:  Negative for dysuria and hematuria.  Musculoskeletal:  Negative for arthralgias and back pain.  Skin:  Negative for color change and rash.  Neurological:  Positive for dizziness. Negative for seizures and syncope.  All other systems reviewed and are negative.  Physical Exam Updated Vital Signs BP 100/65 (BP Location: Right Arm)   Pulse 85   Temp 98.1 F (36.7 C) (Oral)   Ht 4\' 10"  (1.473 m)   Wt 44 kg   LMP 06/30/2021 (Approximate)   SpO2 100%   BMI 20.27 kg/m   Physical Exam Vitals and nursing note reviewed.  Constitutional:      Appearance: She is well-developed.  HENT:     Head: Normocephalic and atraumatic.  Cardiovascular:     Rate and Rhythm: Normal rate and regular rhythm.     Heart sounds: Normal heart sounds.  Pulmonary:     Effort: Pulmonary effort is normal. No tachypnea.     Breath sounds: Normal breath sounds.  Abdominal:     Palpations: Abdomen is soft.     Tenderness: There is no abdominal tenderness.  Musculoskeletal:     Right lower leg: No edema.     Left lower leg: No edema.  Skin:    General: Skin is warm and dry.  Neurological:     General: No focal deficit present.     Mental Status: She is alert and oriented to person, place, and time.  Psychiatric:        Mood and Affect: Mood normal.        Behavior: Behavior normal.    ED Results / Procedures / Treatments   Labs (all labs ordered are listed, but only abnormal results are displayed) Labs Reviewed  CBC - Abnormal; Notable for the following components:      Result Value   RBC 3.77 (*)    Hemoglobin 10.8 (*)    HCT 33.2 (*)    All other components within normal limits  COMPREHENSIVE METABOLIC PANEL  LIPASE, BLOOD  TROPONIN I  (HIGH SENSITIVITY)    EKG None  Radiology DG Chest 2 View  Result Date: 07/28/2021 CLINICAL DATA:  Chest pain EXAM: CHEST - 2 VIEW COMPARISON:  None. FINDINGS: Mild hyperinflation. Heart size and pulmonary vascularity are normal. Lungs are clear. No pleural effusions. No pneumothorax. Mediastinal contours appear intact. Postoperative changes in the right proximal humerus. IMPRESSION: No active cardiopulmonary disease. Electronically Signed   By: 07/30/2021 M.D.   On: 07/28/2021 19:23  Procedures Procedures   Medications Ordered in ED Medications  sodium chloride 0.9 % bolus 1,000 mL (has no administration in time range)    ED Course  I have reviewed the triage vital signs and the nursing notes.  Pertinent labs & imaging results that were available during my care of the patient were reviewed by me and considered in my medical decision making (see chart for details).    MDM Rules/Calculators/A&P                           Ashea Winiarski has a history of homelessness and schizophrenia.  She presents complaining of feeling faint.  She endorsed chest pain that was transient.  She also endorsed some abdominal pain.  Overall vitals were normal, and she had a normal physical exam.  She was noted at times to be talking to herself.  This is in keeping with her diagnosis of schizophrenia, and it appears that she has not picked up her meds from the [facility].  However, she was cooperative and did not seem to be a danger to herself or others. She did not meet criteria for IVC.   ED work-up did not reveal evidence of pneumonia, ACS, intra-abdominal pathology.  Because her abdominal exam was soft, benign, and not consistent with serious intra-abdominal pathology, I deferred imaging. Final Clinical Impression(s) / ED Diagnoses Final diagnoses:  Near syncope    Rx / DC Orders ED Discharge Orders     None        Koleen Distance, MD 07/28/21 2211

## 2021-07-28 NOTE — ED Notes (Signed)
Pt refused EKG. Rn Notified

## 2021-07-28 NOTE — ED Notes (Signed)
Patient transported to X-ray 

## 2021-08-18 ENCOUNTER — Encounter (HOSPITAL_COMMUNITY): Payer: Medicare Other | Admitting: Physician Assistant

## 2021-09-18 ENCOUNTER — Ambulatory Visit (HOSPITAL_COMMUNITY)
Admission: EM | Admit: 2021-09-18 | Discharge: 2021-09-18 | Disposition: A | Discharge status: Home / Self Care | Payer: Medicare Other | Attending: Physician Assistant | Admitting: Physician Assistant

## 2021-09-18 DIAGNOSIS — F333 Major depressive disorder, recurrent, severe with psychotic symptoms: Secondary | ICD-10-CM | POA: Diagnosis not present

## 2021-09-18 DIAGNOSIS — R45851 Suicidal ideations: Secondary | ICD-10-CM | POA: Insufficient documentation

## 2021-09-18 NOTE — Progress Notes (Signed)
TRIAGE: ROUTINE  Trinna Post, PA, reviewed pt's chart and information and met with pt face-to-face and determined pt can be psych cleared.   09/18/21 1935  [place] Triage Screening (Walk-ins at [facility] only)  How Did You Hear About [place]? Self  What Is the Reason for Your Visit/Call Today? Pt states she was waiting for a cab and that someone was after her (she cannot identify who). She states the police caught her tresspassing and that she requested to come to the [facility]. Pt endorses SI but states she has no pills in which to o/d; pt contracts for safety with PA. Pt denies HI, AVH, access to guns/weapons, or SA. Pt shares she has court in December. Pt appears to be responding to internal stimuli - she is yelling and talking to herself - but this appears to be baseline for pt.  How Long Has This Been Causing You Problems? > than 6 months  Have You Recently Had Any Thoughts About Hurting Yourself?  (Pt endorses SI but states she has no pills in which to o/d; pt is able to contract for safety.)  Are You Planning to [place] At This time? No  Have you Recently Had Thoughts About [place]? No  Are You Planning To Harm Someone At This Time? No  Are you currently experiencing any auditory, visual or other hallucinations?  (Pt denies but she is actively responding to internal stimuli)  Have You Used Any Alcohol or Drugs in the Past 24 Hours? No  Do you have any current medical co-morbidities that require immediate attention? No  Clinician description of patient physical appearance/behavior: Pt is dressed in weather-appropriate attire. She is able to answer the questions posed, though answers in short sentences. She is clapping her hands, yelling, and talking to herself.  What Do You Feel Would Help You the Most Today?  (Pt does not know)  If access to [facility] was not available, would you have sought care in the Emergency Department? No  Determination of Need Routine (7 days)   Options For Referral Outpatient Therapy;Medication Management

## 2021-09-19 NOTE — ED Provider Notes (Signed)
Behavioral Health Urgent Care Medical Screening Exam  Patient Name: [name] [name] MRN: [id number] Date of Evaluation: 09/19/21 Chief Complaint: N/A Diagnosis:  Final diagnoses:  Severe episode of recurrent major depressive disorder, with psychotic features (HCC)    History of Present illness: [name] [name] is a 45 y.o. female with a past psychiatric history significant for paranoid schizophrenia who presents to [facility] with a chief complaint of suicidal ideations.  Prior to coming to [facility], patient reports that she was waiting for a cab until someone started to treat her.  Patient is unable to identify the name of the person chasing her.  She was eventually spotted by police officers who had cut her trespassing.  When confronted by the officers, patient requested that she come to [facility].  At the start of the encounter, patient endorsed suicidal thoughts with a plan to overdose on pills.  Patient endorses the following depressive symptoms: feelings of sadness and irritability.  She denies decreased concentration, feelings of guilt/worthlessness, and changes in appetite.  Patient reports that she is tired but has not been experiencing anxiety.  In addition to her depressive symptoms, patient states that she has been having issues with her stomach and she also feels that she has lost weight.  Patient is not currently taking any medications stating that she is not schizophrenic and instead, depressed.  Patient also reports a past history of hospitalization due to mental health.  Patient reports that her most recent admission occurred roughly 7 months ago.  Patient endorses attempting suicide twice via overdose.  Patient denies past history of self-injurious behavior.  Patient endorses passive suicidal ideations stating that she had a plan to overdose.  Patient later confirmed that she does not have any medications to allow her to overdose.  Patient  denies auditory or visual hallucinations.  Patient endorses fair sleep and receives on average 4 hours of intermittent sleep each night.  Patient endorses decreased appetite and eats on average 1-2 meals per day.  Patient denies alcohol consumption, tobacco use, and illicit drug use.  Prior to the conclusion of the encounter, patient reported that she did not feel like a danger to herself.  Patient was able to contract for safety and endorsed having housing to return to once discharged from the facility.  Prior to discharge from the facility, patient began responding to internal stimuli.  Patient was speaking to individuals in the room with her and her speech became extremely loud.  Provider informed patient that she needed to lower her voice while in the facility to which she replied that she needed help.  Provider informed patient that help would be provided to her if she lowered her voice.  Patient yelled at provider stating that she would rather leave the facility then to lower her voice.  Patient requested transportation in order to leave facility.  Psychiatric Specialty Exam  Presentation  General Appearance:Appropriate for Environment; Casual  Eye Contact:Fair  Speech:Clear and Coherent; Normal Rate  Speech Volume:Normal  Handedness:Right   Mood and Affect  Mood:Anxious; Depressed; Irritable; Angry  Affect:Congruent; Labile; Full Range   Thought Process  Thought Processes:Coherent; Goal Directed  Descriptions of Associations:Intact  Orientation:Full (Time, Place and Person)  Thought Content:Delusions; Paranoid Ideation  Diagnosis of Schizophrenia or Schizoaffective disorder in past: Yes  Duration of Psychotic Symptoms: Greater than six months  Hallucinations:Auditory Patient was responding to internal stimuli  Ideas of Reference:Paranoia; Delusions  Suicidal Thoughts:Yes, Passive Without Intent; Without Means to Carry  Out  Homicidal Thoughts:No   Sensorium   Memory:Immediate Fair; Recent Fair; Remote Fair  Judgment:Poor  Insight:Shallow   Executive Functions  Concentration:Fair  Attention Span:Good  Recall:Fair  [employer] of Knowledge:Fair  Language:Fair   Psychomotor Activity  Psychomotor Activity:Normal   Assets  Assets:Communication Skills; Housing   Sleep  Sleep:Fair  Number of hours: 4   No data recorded  Physical Exam: Physical Exam Psychiatric:        Attention and Perception: Attention normal. She perceives auditory (Patient denied auditory and visual hallucinations at first, but towards the end of the encounter, patient appeared to be responding to internal stimuli.) hallucinations. She does not perceive visual hallucinations.        Mood and Affect: Mood is anxious and depressed. Affect is angry.        Speech: Speech normal.        Behavior: Behavior is uncooperative and agitated.        Thought Content: Thought content is paranoid and delusional. Thought content includes suicidal ideation. Thought content does not include homicidal ideation.   ROS Blood pressure 121/85, pulse 77, temperature 98.1 F (36.7 C), temperature source Oral, resp. rate 16, SpO2 100 %. There is no height or weight on file to calculate BMI.  Musculoskeletal: Strength & Muscle Tone: within normal limits Gait & Station: normal Patient leans: N/A   [facility] MSE Discharge Disposition for Follow up and Recommendations: Based on my evaluation the patient does not appear to have an emergency medical condition and can be discharged with resources and follow up care in outpatient services for Medication Management.  Patient originally had passive suicidal ideations with a plan to overdose.  Due to patient not having means to carry out plan, patient reported that she did not feel like a danger to herself.  Patient was able to contract for safety following the conclusion of the encounter.  Prior to discharge from the facility, patient began to  respond to internal stimuli and was talking to individuals that were present.  Patient was instructed to lower her voice to be respectful to the other patrons within the facility.  Patient would not lower her voice and yelled at provider.  Patient requested transportation upon discharge.  Patient is currently being seen at [facility].  Patient missed last appointment scheduled for 08/18/2021.  Patient to be rescheduled for follow-up appointment for the near future.  Meta Hatchet, PA 09/19/2021, 7:38 AM

## 2021-09-22 ENCOUNTER — Telehealth (HOSPITAL_COMMUNITY): Payer: Self-pay | Admitting: Emergency Medicine

## 2021-09-22 NOTE — BH Assessment (Signed)
Care Management - Follow Up [facility] Discharges   Writer attempted to make contact with patient today and was unsuccessful.  Writer left a HIPPA compliant voice message.   Per chart review, pt provided with outpatient resources 

## 2022-03-23 DIAGNOSIS — R404 Transient alteration of awareness: Secondary | ICD-10-CM | POA: Diagnosis not present

## 2022-04-06 DIAGNOSIS — 419620001 Death: Secondary | SNOMED CT | POA: Diagnosis not present

## 2022-04-06 DEATH — deceased
